# Patient Record
Sex: Female | Born: 1935 | Race: Black or African American | Hispanic: No | Smoking: Former smoker
Health system: Southern US, Community
[De-identification: ages and names within clinical notes are randomized; demographics above are authoritative.]

## PROBLEM LIST (undated history)

## (undated) DIAGNOSIS — I714 Abdominal aortic aneurysm, without rupture, unspecified: Secondary | ICD-10-CM

## (undated) DIAGNOSIS — Z950 Presence of cardiac pacemaker: Secondary | ICD-10-CM

## (undated) DIAGNOSIS — I509 Heart failure, unspecified: Secondary | ICD-10-CM

## (undated) DIAGNOSIS — I10 Essential (primary) hypertension: Secondary | ICD-10-CM

## (undated) DIAGNOSIS — K5790 Diverticulosis of intestine, part unspecified, without perforation or abscess without bleeding: Secondary | ICD-10-CM

## (undated) DIAGNOSIS — K219 Gastro-esophageal reflux disease without esophagitis: Secondary | ICD-10-CM

## (undated) DIAGNOSIS — K5090 Crohn's disease, unspecified, without complications: Secondary | ICD-10-CM

## (undated) DIAGNOSIS — E041 Nontoxic single thyroid nodule: Secondary | ICD-10-CM

## (undated) DIAGNOSIS — K922 Gastrointestinal hemorrhage, unspecified: Secondary | ICD-10-CM

## (undated) DIAGNOSIS — G4733 Obstructive sleep apnea (adult) (pediatric): Secondary | ICD-10-CM

## (undated) DIAGNOSIS — K5792 Diverticulitis of intestine, part unspecified, without perforation or abscess without bleeding: Secondary | ICD-10-CM

## (undated) HISTORY — PX: CHOLECYSTECTOMY: SHX55

## (undated) HISTORY — PX: CORONARY ANGIOPLASTY WITH STENT PLACEMENT: SHX49

---

## 2013-01-08 HISTORY — PX: PACEMAKER PLACEMENT: SHX43

## 2015-05-30 ENCOUNTER — Emergency Department (HOSPITAL_COMMUNITY): Payer: Medicare Other

## 2015-05-30 ENCOUNTER — Inpatient Hospital Stay (HOSPITAL_COMMUNITY)
Admission: EM | Admit: 2015-05-30 | Discharge: 2015-06-04 | DRG: 378 | Disposition: A | Discharge status: Home / Self Care | Payer: Medicare Other | Attending: Internal Medicine | Admitting: Internal Medicine

## 2015-05-30 ENCOUNTER — Encounter (HOSPITAL_COMMUNITY): Payer: Self-pay

## 2015-05-30 DIAGNOSIS — R1011 Right upper quadrant pain: Secondary | ICD-10-CM | POA: Diagnosis not present

## 2015-05-30 DIAGNOSIS — K922 Gastrointestinal hemorrhage, unspecified: Principal | ICD-10-CM | POA: Diagnosis present

## 2015-05-30 DIAGNOSIS — Z7982 Long term (current) use of aspirin: Secondary | ICD-10-CM

## 2015-05-30 DIAGNOSIS — E860 Dehydration: Secondary | ICD-10-CM | POA: Diagnosis present

## 2015-05-30 DIAGNOSIS — Z950 Presence of cardiac pacemaker: Secondary | ICD-10-CM

## 2015-05-30 DIAGNOSIS — K50811 Crohn's disease of both small and large intestine with rectal bleeding: Secondary | ICD-10-CM | POA: Diagnosis not present

## 2015-05-30 DIAGNOSIS — R195 Other fecal abnormalities: Secondary | ICD-10-CM | POA: Insufficient documentation

## 2015-05-30 DIAGNOSIS — N289 Disorder of kidney and ureter, unspecified: Secondary | ICD-10-CM

## 2015-05-30 DIAGNOSIS — Z87891 Personal history of nicotine dependence: Secondary | ICD-10-CM

## 2015-05-30 DIAGNOSIS — Z6832 Body mass index (BMI) 32.0-32.9, adult: Secondary | ICD-10-CM

## 2015-05-30 DIAGNOSIS — I10 Essential (primary) hypertension: Secondary | ICD-10-CM | POA: Diagnosis present

## 2015-05-30 DIAGNOSIS — I2510 Atherosclerotic heart disease of native coronary artery without angina pectoris: Secondary | ICD-10-CM | POA: Diagnosis not present

## 2015-05-30 DIAGNOSIS — Z7951 Long term (current) use of inhaled steroids: Secondary | ICD-10-CM

## 2015-05-30 DIAGNOSIS — K2900 Acute gastritis without bleeding: Secondary | ICD-10-CM | POA: Diagnosis present

## 2015-05-30 DIAGNOSIS — K5730 Diverticulosis of large intestine without perforation or abscess without bleeding: Secondary | ICD-10-CM | POA: Diagnosis present

## 2015-05-30 DIAGNOSIS — Z955 Presence of coronary angioplasty implant and graft: Secondary | ICD-10-CM

## 2015-05-30 DIAGNOSIS — D649 Anemia, unspecified: Secondary | ICD-10-CM | POA: Diagnosis present

## 2015-05-30 DIAGNOSIS — Z79899 Other long term (current) drug therapy: Secondary | ICD-10-CM

## 2015-05-30 DIAGNOSIS — K449 Diaphragmatic hernia without obstruction or gangrene: Secondary | ICD-10-CM | POA: Diagnosis present

## 2015-05-30 DIAGNOSIS — K921 Melena: Secondary | ICD-10-CM | POA: Diagnosis present

## 2015-05-30 DIAGNOSIS — D62 Acute posthemorrhagic anemia: Secondary | ICD-10-CM | POA: Diagnosis present

## 2015-05-30 DIAGNOSIS — D509 Iron deficiency anemia, unspecified: Secondary | ICD-10-CM | POA: Diagnosis present

## 2015-05-30 DIAGNOSIS — R109 Unspecified abdominal pain: Secondary | ICD-10-CM | POA: Diagnosis present

## 2015-05-30 DIAGNOSIS — K641 Second degree hemorrhoids: Secondary | ICD-10-CM | POA: Diagnosis present

## 2015-05-30 DIAGNOSIS — N179 Acute kidney failure, unspecified: Secondary | ICD-10-CM | POA: Diagnosis present

## 2015-05-30 DIAGNOSIS — D500 Iron deficiency anemia secondary to blood loss (chronic): Secondary | ICD-10-CM | POA: Diagnosis not present

## 2015-05-30 DIAGNOSIS — Z800 Family history of malignant neoplasm of digestive organs: Secondary | ICD-10-CM

## 2015-05-30 DIAGNOSIS — K5090 Crohn's disease, unspecified, without complications: Secondary | ICD-10-CM | POA: Diagnosis present

## 2015-05-30 DIAGNOSIS — E669 Obesity, unspecified: Secondary | ICD-10-CM | POA: Diagnosis present

## 2015-05-30 DIAGNOSIS — E871 Hypo-osmolality and hyponatremia: Secondary | ICD-10-CM | POA: Diagnosis present

## 2015-05-30 DIAGNOSIS — K644 Residual hemorrhoidal skin tags: Secondary | ICD-10-CM | POA: Diagnosis present

## 2015-05-30 HISTORY — DX: Essential (primary) hypertension: I10

## 2015-05-30 HISTORY — DX: Crohn's disease, unspecified, without complications: K50.90

## 2015-05-30 LAB — COMPREHENSIVE METABOLIC PANEL
ALBUMIN: 2.6 g/dL — AB (ref 3.5–5.0)
ALT: 8 U/L — AB (ref 14–54)
AST: 18 U/L (ref 15–41)
Alkaline Phosphatase: 74 U/L (ref 38–126)
Anion gap: 6 (ref 5–15)
BUN: 13 mg/dL (ref 6–20)
CHLORIDE: 101 mmol/L (ref 101–111)
CO2: 27 mmol/L (ref 22–32)
CREATININE: 1.14 mg/dL — AB (ref 0.44–1.00)
Calcium: 8 mg/dL — ABNORMAL LOW (ref 8.9–10.3)
GFR calc Af Amer: 50 mL/min — ABNORMAL LOW (ref >60)
GFR calc non Af Amer: 43 mL/min — ABNORMAL LOW (ref >60)
GLUCOSE: 109 mg/dL — AB (ref 65–99)
POTASSIUM: 4 mmol/L (ref 3.5–5.1)
Sodium: 134 mmol/L — ABNORMAL LOW (ref 135–145)
Total Bilirubin: 0.5 mg/dL (ref 0.3–1.2)
Total Protein: 6.2 g/dL — ABNORMAL LOW (ref 6.5–8.1)

## 2015-05-30 LAB — CBC WITH DIFFERENTIAL/PLATELET
BASOS ABS: 0 10*3/uL (ref 0.0–0.1)
BASOS PCT: 0 %
EOS PCT: 1 %
Eosinophils Absolute: 0 10*3/uL (ref 0.0–0.7)
HEMATOCRIT: 24.7 % — AB (ref 36.0–46.0)
Hemoglobin: 7.6 g/dL — ABNORMAL LOW (ref 12.0–15.0)
LYMPHS PCT: 17 %
Lymphs Abs: 0.9 10*3/uL (ref 0.7–4.0)
MCH: 25.8 pg — ABNORMAL LOW (ref 26.0–34.0)
MCHC: 30.8 g/dL (ref 30.0–36.0)
MCV: 83.7 fL (ref 78.0–100.0)
MONO ABS: 0.8 10*3/uL (ref 0.1–1.0)
Monocytes Relative: 15 %
NEUTROS ABS: 3.8 10*3/uL (ref 1.7–7.7)
Neutrophils Relative %: 67 %
PLATELETS: 390 10*3/uL (ref 150–400)
RBC: 2.95 MIL/uL — AB (ref 3.87–5.11)
RDW: 16.5 % — AB (ref 11.5–15.5)
WBC: 5.6 10*3/uL (ref 4.0–10.5)

## 2015-05-30 LAB — HEMOGLOBIN: HEMOGLOBIN: 7.4 g/dL — AB (ref 12.0–15.0)

## 2015-05-30 LAB — PROTIME-INR
INR: 1.11 (ref 0.00–1.49)
Prothrombin Time: 14.5 seconds (ref 11.6–15.2)

## 2015-05-30 LAB — HEMATOCRIT: HCT: 24 % — ABNORMAL LOW (ref 36.0–46.0)

## 2015-05-30 LAB — PREPARE RBC (CROSSMATCH)

## 2015-05-30 LAB — ABO/RH: ABO/RH(D): A POS

## 2015-05-30 LAB — POC OCCULT BLOOD, ED: FECAL OCCULT BLD: POSITIVE — AB

## 2015-05-30 LAB — LIPASE, BLOOD: Lipase: 33 U/L (ref 11–51)

## 2015-05-30 IMAGING — DX DG ABDOMEN ACUTE W/ 1V CHEST
4 series · 4 of 4 positions shown · non-contrast
Comparison: None.

CLINICAL DATA: Right upper quadrant abdominal pain for several
months.

EXAM:
DG ABDOMEN ACUTE W/ 1V CHEST

[chest pa]
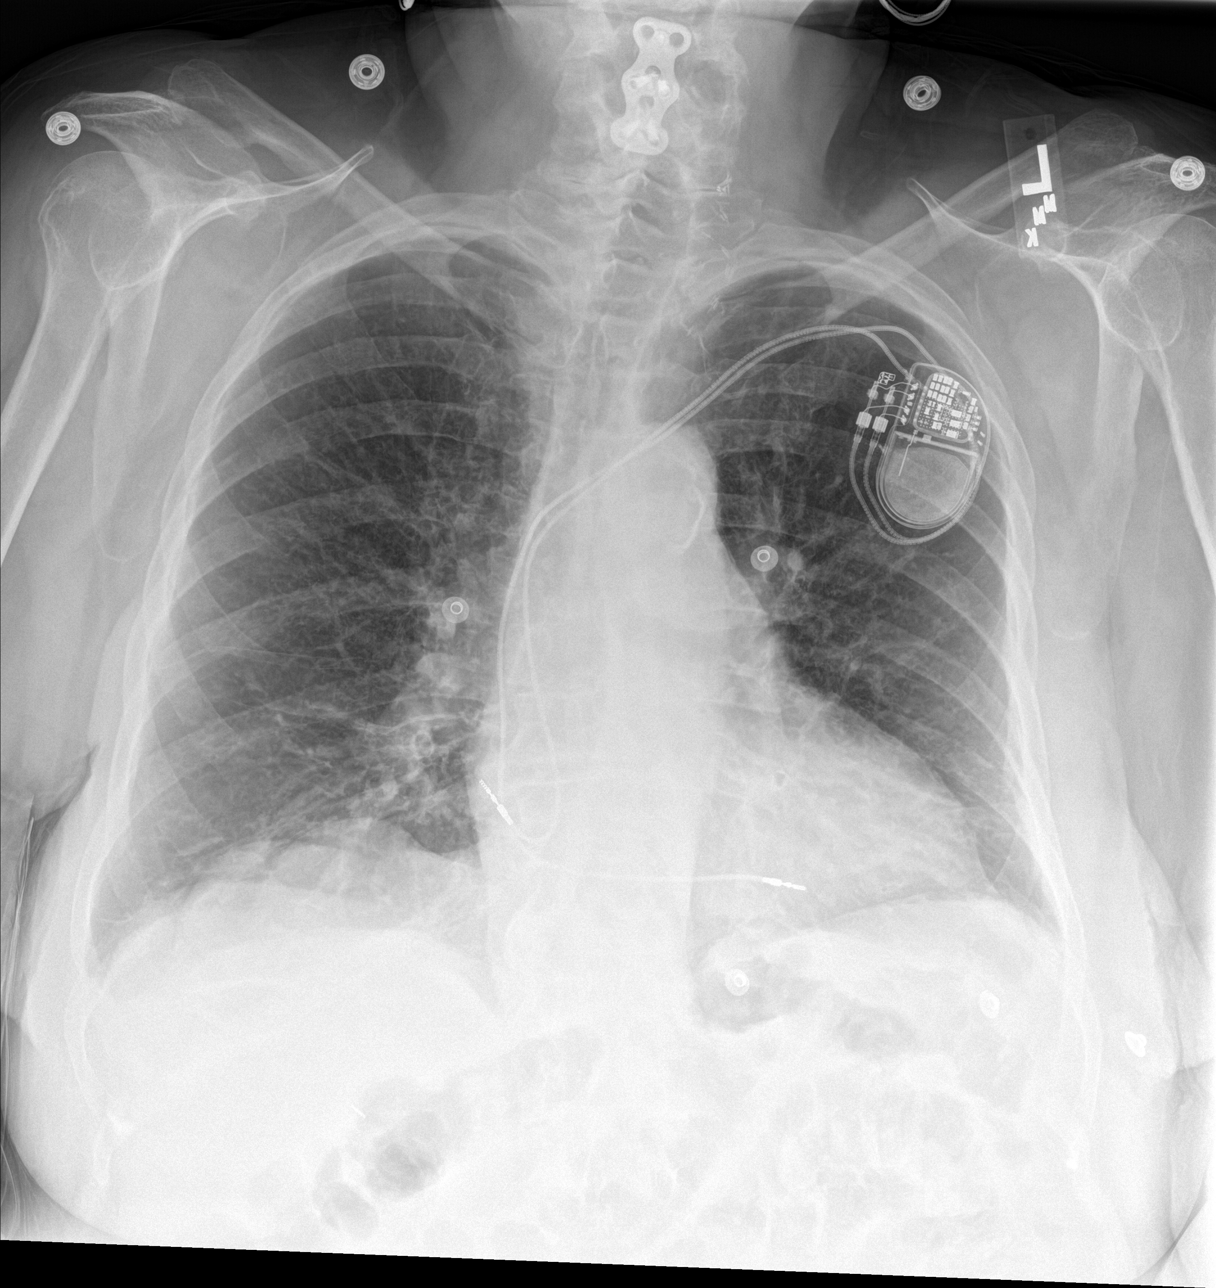

[abdomen erect]
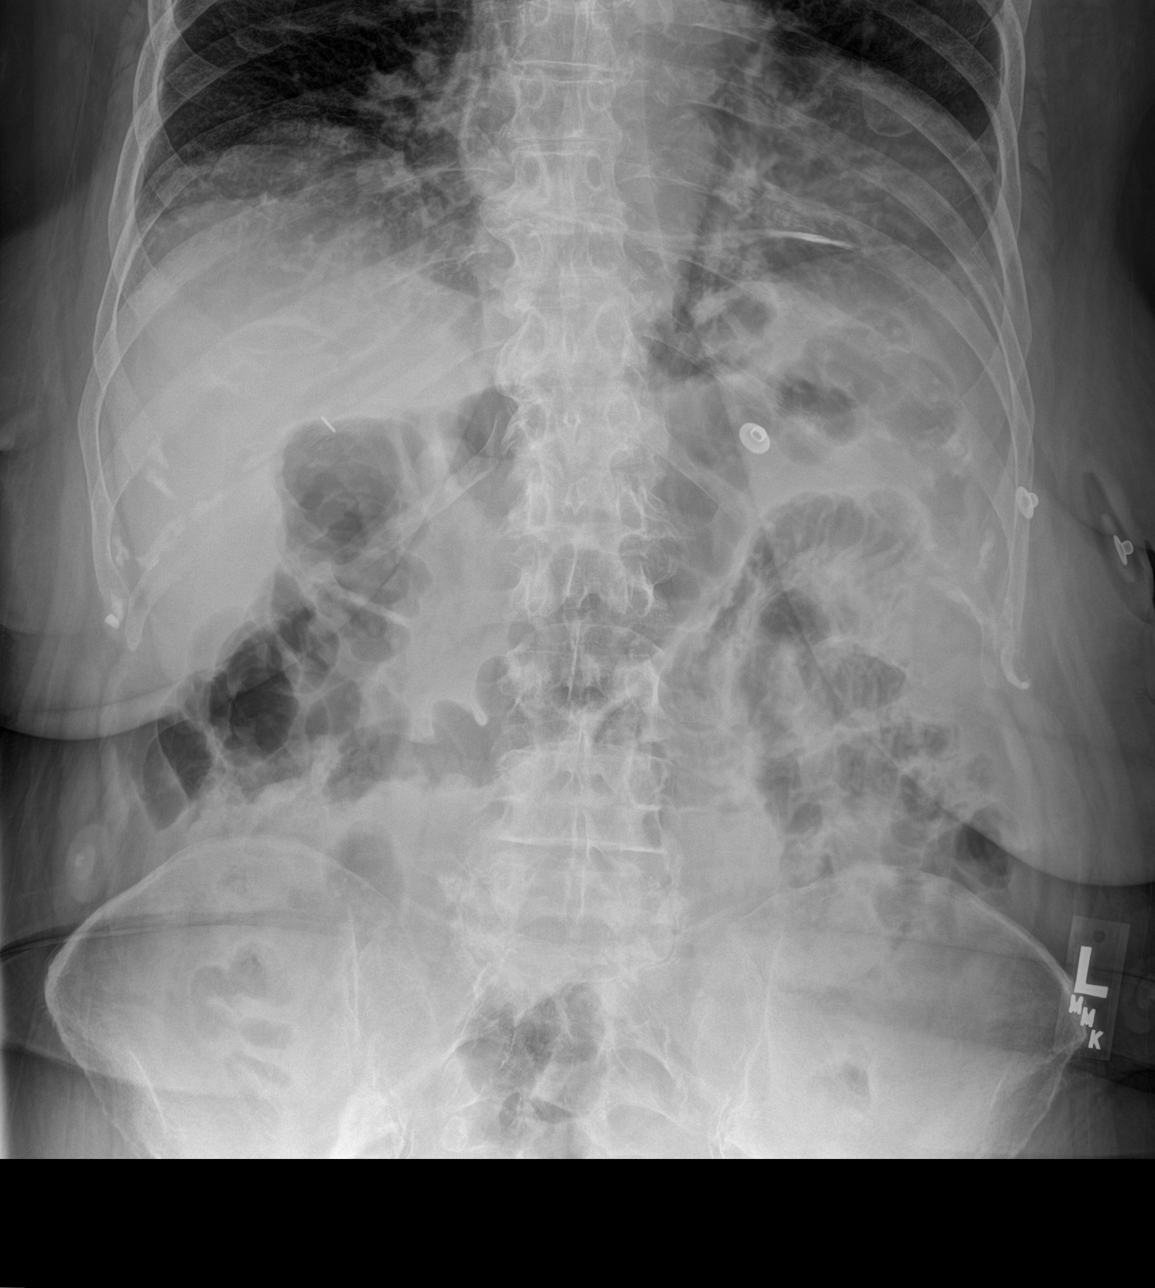

[abdomen supine (1 of 2)]
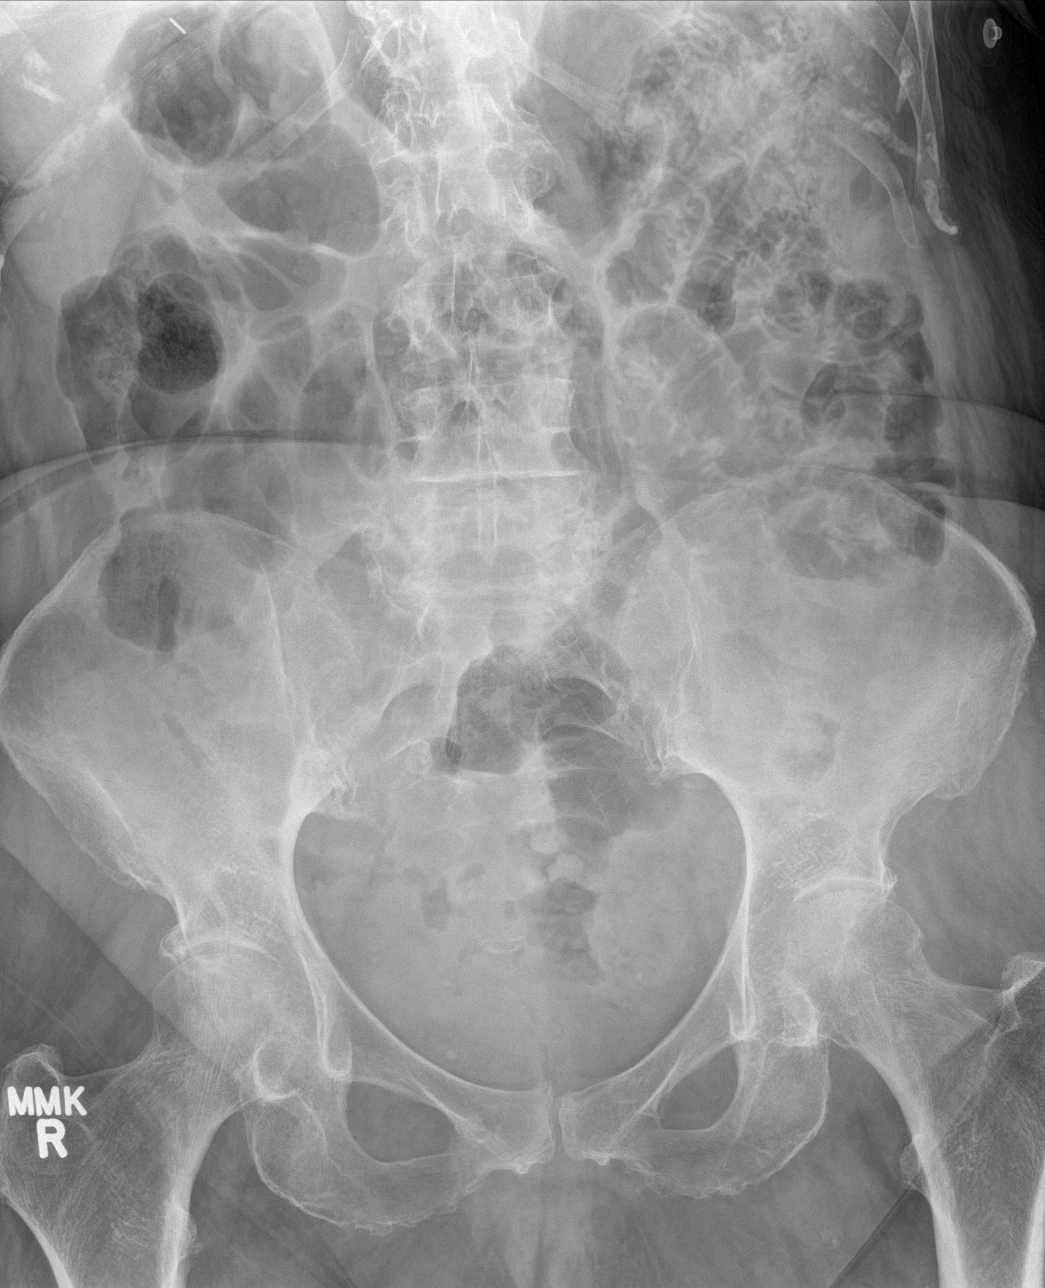

[abdomen supine (2 of 2)]
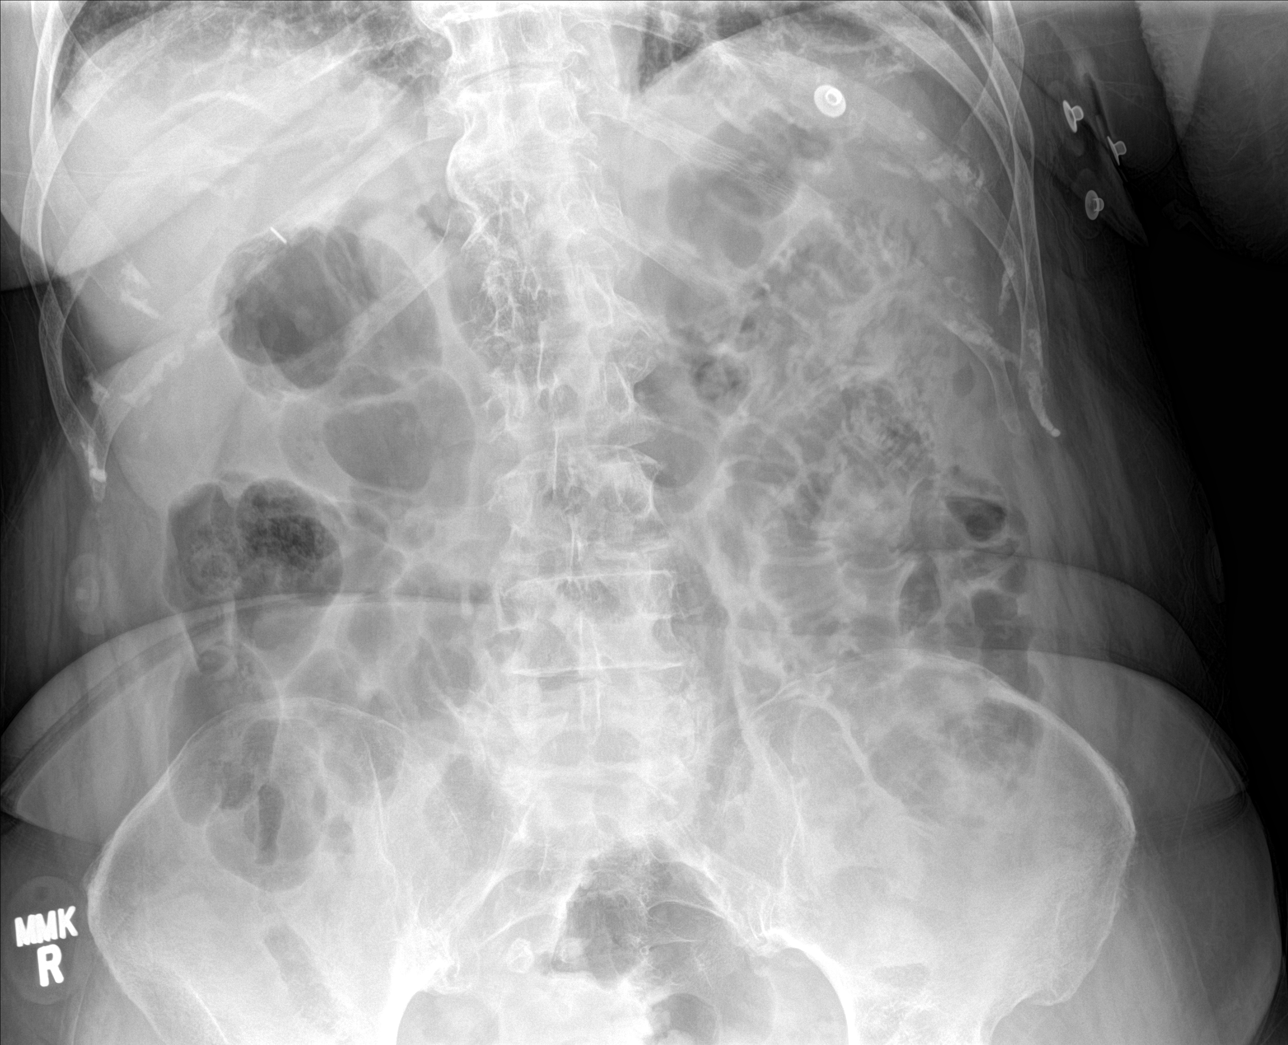

[4 of 4 positions shown; findings below may reference images not displayed]

FINDINGS: There is no evidence of dilated bowel loops or free intraperitoneal
air. Phleboliths are noted in the pelvis. Heart size and mediastinal
contours are within normal limits. Both lungs are clear.
IMPRESSION: No evidence of bowel obstruction or ileus. No acute cardiopulmonary
disease.

## 2015-05-30 MED ORDER — SODIUM CHLORIDE 0.9 % IV BOLUS (SEPSIS)
500.0000 mL | Freq: Once | INTRAVENOUS | Status: AC
  Administered 2015-05-30: 500 mL via INTRAVENOUS

## 2015-05-30 MED ORDER — PANTOPRAZOLE SODIUM 40 MG IV SOLR
40.0000 mg | Freq: Two times a day (BID) | INTRAVENOUS | Status: DC
  Administered 2015-05-30 – 2015-05-31 (×3): 40 mg via INTRAVENOUS
  Filled 2015-05-30 (×2): qty 40

## 2015-05-30 MED ORDER — ACETAMINOPHEN 650 MG RE SUPP: 650.0000 mg | Freq: Four times a day (QID) | RECTAL | Status: DC | PRN

## 2015-05-30 MED ORDER — ACETAMINOPHEN 325 MG PO TABS
650.0000 mg | ORAL_TABLET | Freq: Four times a day (QID) | ORAL | Status: DC | PRN
  Administered 2015-06-01: 650 mg via ORAL
  Filled 2015-05-30: qty 2

## 2015-05-30 MED ORDER — PANTOPRAZOLE SODIUM 40 MG IV SOLR
40.0000 mg | Freq: Once | INTRAVENOUS | Status: AC
  Administered 2015-05-30: 40 mg via INTRAVENOUS
  Filled 2015-05-30: qty 40

## 2015-05-30 MED ORDER — HYDRALAZINE HCL 20 MG/ML IJ SOLN: 10.0000 mg | INTRAMUSCULAR | Status: DC | PRN

## 2015-05-30 MED ORDER — ONDANSETRON HCL 4 MG/2ML IJ SOLN: 4.0000 mg | Freq: Four times a day (QID) | INTRAMUSCULAR | Status: DC | PRN

## 2015-05-30 MED ORDER — POLYETHYLENE GLYCOL 3350 17 G PO PACK: 17.0000 g | PACK | Freq: Every day | ORAL | Status: DC | PRN

## 2015-05-30 MED ORDER — SODIUM CHLORIDE 0.9 % IV SOLN: Freq: Once | INTRAVENOUS | Status: AC

## 2015-05-30 MED ORDER — SODIUM CHLORIDE 0.9% FLUSH
3.0000 mL | Freq: Two times a day (BID) | INTRAVENOUS | Status: DC
  Administered 2015-05-30 – 2015-06-03 (×6): 3 mL via INTRAVENOUS

## 2015-05-30 MED ORDER — SODIUM CHLORIDE 0.9 % IV SOLN: Freq: Once | INTRAVENOUS | Status: DC

## 2015-05-30 MED ORDER — ONDANSETRON HCL 4 MG PO TABS: 4.0000 mg | ORAL_TABLET | Freq: Four times a day (QID) | ORAL | Status: DC | PRN

## 2015-05-30 MED ORDER — HYDROCODONE-ACETAMINOPHEN 5-325 MG PO TABS: 1.0000 | ORAL_TABLET | ORAL | Status: DC | PRN

## 2015-05-30 MED ORDER — BISACODYL 10 MG RE SUPP: 10.0000 mg | Freq: Every day | RECTAL | Status: DC | PRN

## 2015-05-30 NOTE — ED Provider Notes (Signed)
CSN: [id number]     Arrival date & time 05/30/15  1354 History   First MD Initiated Contact with Patient 05/30/15 1500     Chief Complaint  Patient presents with  . Abdominal Pain     (Consider location/radiation/quality/duration/timing/severity/associated sxs/prior Treatment) HPI Comments: Felicia Benson is a 82 y.o. female from out of town with history of Crohn's disease on pantaza and nexium, hypertension, third-degree heart block with pacemaker, and coronary artery disease presents to ED with worsening epigastric and right upper quadrant abdominal pain. Patient states pain has worsened over the last 10 days. Pain is intermittent, 5/10, and sharp in nature. No radiation of pain. Endorses early satiety. Improved with food consumption. Denies nausea, vomiting, diarrhea, or constipation. Denies hematochezia or melena. Patient reports back in February 2017 following Angiocath she was anemic with hemoglobin at 8.3. Internist placed her on iron supplementation. Patient has had intermittent epigastric discomfort since starting iron supplementation.  Most recent repeat hemoglobin was 9.8 approximately 2 weeks ago. Patient states she has not taken her iron supplementation in 1 week. She is not currently on blood thinners.   Of note, she receives her care in [place]. She reports a history of a precancerous stomach lesion that was removed three years ago. Patient also has a history of two prior obstructions secondary to Crohn's. She is scheduled for EGD on May 30th at [facility] for re-evaluation.    Lastly, EMS noted pacemaker was not pacing. Patient denies chest pain or shortness of breath. Pacemaker is medtronic.   Patient is a 82 y.o. female presenting with abdominal pain. The history is provided by the patient and a relative.  Abdominal Pain Pain location:  Epigastric and RUQ Pain radiates to:  Does not radiate Duration:  10 days Associated symptoms: cough and fatigue   Associated symptoms:  no chest pain, no chills, no constipation, no diarrhea, no dysuria, no fever, no hematuria, no nausea, no shortness of breath, no sore throat and no vomiting     Past Medical History  Diagnosis Date  . Hypertension   . Crohn's disease [facility])    Past Surgical History  Procedure Laterality Date  . Cholecystectomy     Family History  Problem Relation Age of Onset  . Colon cancer Neg Hx    Social History  Substance Use Topics  . Smoking status: Former Games developer  . Smokeless tobacco: None  . Alcohol Use: No   OB History    No data available     Review of Systems  Constitutional: Positive for appetite change ( decrease) and fatigue. Negative for fever, chills and diaphoresis.  HENT: Negative for sore throat and trouble swallowing.   Eyes: Negative for visual disturbance.  Respiratory: Positive for cough. Negative for shortness of breath.   Cardiovascular: Positive for leg swelling ( chronic b/l). Negative for chest pain.  Gastrointestinal: Positive for abdominal pain. Negative for nausea, vomiting, diarrhea, constipation and blood in stool.  Genitourinary: Negative for dysuria and hematuria.  Musculoskeletal: Negative for neck pain and neck stiffness.  Skin: Negative for rash.  Neurological: Negative for dizziness, syncope, light-headedness and numbness.      Allergies  Review of patient's allergies indicates no known allergies.  Home Medications   Prior to Admission medications   Not on File   BP 124/59 mmHg  Pulse 75  Temp(Src) 98 F (36.7 C) (Oral)  Resp 16  Ht 5\' 6"  (1.676 m)  Wt 90.719 kg  BMI 32.30 kg/m2  SpO2 98% Physical  Exam  Constitutional: She appears well-developed and well-nourished. No distress.  HENT:  Head: Normocephalic and atraumatic.  Mouth/Throat: Oropharynx is clear and moist. No oropharyngeal exudate.  Eyes: Conjunctivae and EOM are normal. Pupils are equal, round, and reactive to light. Right eye exhibits no discharge. Left eye exhibits no  discharge. No scleral icterus.  Neck: Normal range of motion. Neck supple.  Cardiovascular: Normal rate, regular rhythm, normal heart sounds and intact distal pulses.   No murmur heard. Pulmonary/Chest: Effort normal and breath sounds normal. No respiratory distress.  Pacemaker palpated in left anterior chest wall.   Abdominal: Soft. Normal appearance and bowel sounds are normal. She exhibits no distension, no pulsatile midline mass and no mass. There is tenderness in the right upper quadrant and epigastric area. There is no rebound and no guarding.  Chaperone present for duration of exam. External hemorrhoids noted. Stool in rectal vault. Stool is brown in coloration.   Musculoskeletal: Normal range of motion.  Lymphadenopathy:    She has no cervical adenopathy.  Neurological: She is alert. Coordination normal.  Skin: Skin is warm and dry. She is not diaphoretic.  Psychiatric: She has a normal mood and affect. Her behavior is normal.    ED Course  Procedures (including critical care time) Labs Review Labs Reviewed  CBC WITH DIFFERENTIAL/PLATELET - Abnormal; Notable for the following:    RBC 2.95 (*)    Hemoglobin 7.6 (*)    HCT 24.7 (*)    MCH 25.8 (*)    RDW 16.5 (*)    All other components within normal limits  COMPREHENSIVE METABOLIC PANEL - Abnormal; Notable for the following:    Sodium 134 (*)    Glucose, Bld 109 (*)    Creatinine, Ser 1.14 (*)    Calcium 8.0 (*)    Total Protein 6.2 (*)    Albumin 2.6 (*)    ALT 8 (*)    GFR calc non Af Amer 43 (*)    GFR calc Af Amer 50 (*)    All other components within normal limits  HEMOGLOBIN - Abnormal; Notable for the following:    Hemoglobin 7.4 (*)    All other components within normal limits  HEMATOCRIT - Abnormal; Notable for the following:    HCT 24.0 (*)    All other components within normal limits  POC OCCULT BLOOD, ED - Abnormal; Notable for the following:    Fecal Occult Bld POSITIVE (*)    All other components  within normal limits  LIPASE, BLOOD  PROTIME-INR  BASIC METABOLIC PANEL  HEMOGLOBIN AND HEMATOCRIT, BLOOD  TYPE AND SCREEN  ABO/RH  PREPARE RBC (CROSSMATCH)    Imaging Review Dg Abd Acute W/chest  05/30/2015  CLINICAL DATA:  Right upper quadrant abdominal pain for several months. EXAM: DG ABDOMEN ACUTE W/ 1V CHEST COMPARISON:  None. FINDINGS: There is no evidence of dilated bowel loops or free intraperitoneal air. Phleboliths are noted in the pelvis. Heart size and mediastinal contours are within normal limits. Both lungs are clear. IMPRESSION: No evidence of bowel obstruction or ileus. No acute cardiopulmonary disease. Electronically Signed   By: Lupita Raider, M.D.   On: 05/30/2015 17:02   I have personally reviewed and evaluated these images and lab results as part of my medical decision-making.   EKG Interpretation None      MDM   Final diagnoses:  Heme + stool  Anemia, unspecified anemia type   KYLIEE ORTEGO is a 82 y.o. female with history  of Crohn's, hypertension, third-degree heart block status post pacemaker, coronary artery disease presents to ED with complaint of worsening epigastric and right upper quadrant pain 10 days. Patient is afebrile and nontoxic. Vital signs are stable. Physical exam remarkable for tenderness to palpation of epigastric and right upper quadrant. Abdomen is soft and positive bowel sounds heard. Will check CBC, CMP, fecal cold, coags, lipase.  Of note, EMS reports pacemaker not pacing. In-hospital, pacemaker is pacing. She denies chest pain or shortness of breath will interrogate pacemaker.   CBC remarkable for hemoglobin of 7.6. Per patient last hemoglobin evaluation was 9.8 on April 29. Hemoccult positive. Coags normal. LFTs unremarkable. Lipase normal. Type and screen conducted. CMP remarkable for elevated creatinine. Abdominal plain films negative for obstruction or ileus. Based on 2 g drop in hemoglobin and Hemoccult-positive will consult  [facility] for admission for further evaluation and possible transfusion.  5:40PM: Pacemaker interrogation unremarkable. Demand pacemaker.   6:15 PM: Consulted Dr. Antionette Char with [facility]. Agrees to admit patient to MedSurg.   [address] [place], [place] 05/31/15 9480  Eber Hong, MD 05/31/15 [phone]

## 2015-05-30 NOTE — ED Notes (Signed)
Attempted report x1. 

## 2015-05-30 NOTE — ED Notes (Signed)
This RN attempted IV access x 2 w/o success.  

## 2015-05-30 NOTE — ED Provider Notes (Signed)
The patient is an 82 year old female with a long history of multiple medical problems including Crohn's disease for which she takes medications as well as coronary disease, she has been stented multiple times including within the last 6 months. She reports that over the last couple of days she has had some increasing epigastric pain. On exam the patient has a soft abdomen with minimal tenderness in the epigastrium. She has no tachycardia, she has no peripheral edema of any concern, her laboratory values show that she has had a 2 g drop in her hemoglobin compared to what she reports from her visit with her GI doctor 3 weeks ago. Otherwise her labs are unremarkable, her Hemoccult test was positive, the stool was brown. She will be admitted to the hospital for symptomatically anemia, physician Asst. Daphane Shepherd discussed with hospitalist who agreed to admit.  Medical screening examination/treatment/procedure(s) were conducted as a shared visit with non-physician practitioner(s) and myself.  I personally evaluated the patient during the encounter.   Clinical Impression:   Final diagnoses:  Heme + stool  Anemia, unspecified anemia type         Eber Hong, MD 05/31/15 [phone]

## 2015-05-30 NOTE — ED Notes (Signed)
Per EMS - pt from [place], visiting family. Pt c/o right upper abd/epigastric discomfort x 1 week. Reports decreased appetite and fatigue, denies n/v/d. Pt supposed to have endoscopy this week. Gallbladder prev removed. Hx abd issues, including chrons. Pt has pacemaker, not pacing for EMS. Normal bowel movement this morning. Pt took  aspirin PTA. Hx former smoker, htn.

## 2015-05-30 NOTE — H&P (Signed)
History and Physical    Felicia Benson DJS:[id number] DOB: Feb 19, 1930 DOA: 05/30/2015  PCP: Pcp Not In System   Patient coming from: Home  Chief Complaint: RUQ abdominal pain, fatigue, generalized weakness  HPI: Felicia Benson is a 82 y.o. female with medical history significant for Crohn's disease, coronary artery disease with stent, hypertension, and third degree heart block with pacer who presents to the ED with right upper quadrant epigastric pain, fatigue, generalized weakness, and early satiety. Patient is here visiting family from [place], [place] where she typically receives her medical care. She reports along-standing history of intermittent epigastric discomfort and underwent a series of upper endoscopies with what sounds like resection of dysplastic mucosa before being told that the concerning tissue was gone. She never received any other treatment for this such as chemotherapy or radiation. She has continued to take Nexium twice a day but reports a recurrence in her epigastric and right upper quadrant discomfort for approximately 2 months, worsening particularly over the past week. Patient reports her hemoglobin was checkedat the end of April and 9.8 at that time.  She had been scheduled by her outpatient gastroenterologist in [place] for upper endoscopy on May 30. She denies nausea, vomiting, or diarrhea. She denies melena or hematochezia. She denies chest pain, palpitations, or dyspnea. She takes a baby aspirin daily, but no other antiplatelet or blood thinner. With her condition worsening, the patient called EMS for transport to the hospital. She was instructed by the dispatcher to take 4 baby aspirins, which she did.  ED Course: Upon arrival to the ED, patient is found to be afebrile, saturating well on room air, and with vital signs stable. DRE was performed with return of brown stool which was FOBT positive. Chest x-ray was negative for acute cardiopulmonary disease  and KUB negative for ileus or evidence of bowel obstruction. CMP features a serum creatinine 1.14 with no prior for comparison, a mild hyponatremia, and albumin of 2.6. CBC is notable for hemoglobin of 7.6 with normal MCV. INR is normal at 1.11.  Patient was given 40 mg IV push of Protonix, type and screen was performed, and gastroenterology was consulted by the EDP. EMS had raised concern for the patient's pacer possibly malfunctioning, but it was interrogated in the ED and normal. Patient remained hemodynamically stable in the emergency department and will be admitted to hospital for ongoing evaluation and management of anemia, suspected secondary to upper GI bleed.  Review of Systems:  All other systems reviewed and apart from HPI, are negative.  Past Medical History  Diagnosis Date  . Hypertension   . Crohn's disease [facility])     Past Surgical History  Procedure Laterality Date  . Cholecystectomy       reports that she has quit smoking. She does not have any smokeless tobacco history on file. She reports that she does not drink alcohol or use illicit drugs.  No Known Allergies  Family History  Problem Relation Age of Onset  . Colon cancer Neg Hx      Prior to Admission medications   Not on File    Physical Exam: Filed Vitals:   05/30/15 1445 05/30/15 1515 05/30/15 1545 05/30/15 1615  BP: 111/68 117/68 104/56 126/76  Pulse: 58 64 60 68  Temp:      TempSrc:      Resp: 13 15 15 15   Height:      Weight:      SpO2: 100% 98% 94% 97%  Constitutional: NAD, calm, comfortable Eyes: PERTLA, lids and conjunctivae normal ENMT: Mucous membranes are moist. Posterior pharynx clear of any exudate or lesions.   Neck: normal, supple, no masses, no thyromegaly Respiratory: clear to auscultation bilaterally, no wheezing, no crackles. Normal respiratory effort.    Cardiovascular: S1 & S2 heard, regular rate and rhythm, no significant murmurs / rubs / gallops. 2+ pedal pulses.     Abdomen: No distension, mild tenderness in upper quadrants, no rebound or guarding, no masses palpated. Bowel sounds normal.  Musculoskeletal: no clubbing / cyanosis. No joint deformity upper and lower extremities. Normal muscle tone.  Skin: no significant rashes, lesions, ulcers. Warm, dry, well-perfused. Neurologic: CN 2-12 grossly intact. Sensation intact, DTR normal. Strength 5/5 in all 4 limbs.  Psychiatric: Normal judgment and insight. Alert and oriented x 3. Normal mood and affect.     Labs on Admission: I have personally reviewed following labs and imaging studies  CBC:  Recent Labs Lab 05/30/15 1430  WBC 5.6  NEUTROABS 3.8  HGB 7.6*  HCT 24.7*  MCV 83.7  PLT 478   Basic Metabolic Panel:  Recent Labs Lab 05/30/15 1430  NA 134*  K 4.0  CL 101  CO2 27  GLUCOSE 109*  BUN 13  CREATININE 1.14*  CALCIUM 8.0*   GFR: Estimated Creatinine Clearance: 42.4 mL/min (by C-G formula based on Cr of 1.14). Liver Function Tests:  Recent Labs Lab 05/30/15 1430  AST 18  ALT 8*  ALKPHOS 74  BILITOT 0.5  PROT 6.2*  ALBUMIN 2.6*    Recent Labs Lab 05/30/15 1430  LIPASE 33   No results for input(s): AMMONIA in the last 168 hours. Coagulation Profile:  Recent Labs Lab 05/30/15 1552  INR 1.11   Cardiac Enzymes: No results for input(s): CKTOTAL, CKMB, CKMBINDEX, TROPONINI in the last 168 hours. BNP (last 3 results) No results for input(s): PROBNP in the last 8760 hours. HbA1C: No results for input(s): HGBA1C in the last 72 hours. CBG: No results for input(s): GLUCAP in the last 168 hours. Lipid Profile: No results for input(s): CHOL, HDL, LDLCALC, TRIG, CHOLHDL, LDLDIRECT in the last 72 hours. Thyroid Function Tests: No results for input(s): TSH, T4TOTAL, FREET4, T3FREE, THYROIDAB in the last 72 hours. Anemia Panel: No results for input(s): VITAMINB12, FOLATE, FERRITIN, TIBC, IRON, RETICCTPCT in the last 72 hours. Urine analysis: No results found for:  COLORURINE, APPEARANCEUR, LABSPEC, PHURINE, GLUCOSEU, HGBUR, BILIRUBINUR, KETONESUR, PROTEINUR, UROBILINOGEN, NITRITE, LEUKOCYTESUR Sepsis Labs: @LABRCNTIP (procalcitonin:4,lacticidven:4) )No results found for this or any previous visit (from the past 240 hour(s)).   Radiological Exams on Admission: Dg Abd Acute W/chest  05/30/2015  CLINICAL DATA:  Right upper quadrant abdominal pain for several months. EXAM: DG ABDOMEN ACUTE W/ 1V CHEST COMPARISON:  None. FINDINGS: There is no evidence of dilated bowel loops or free intraperitoneal air. Phleboliths are noted in the pelvis. Heart size and mediastinal contours are within normal limits. Both lungs are clear. IMPRESSION: No evidence of bowel obstruction or ileus. No acute cardiopulmonary disease. Electronically Signed   By: Marijo Conception, M.D.   On: 05/30/2015 17:02    EKG: Not performed, will obtain as appropriate.   Assessment/Plan  1. GI blood-loss anemia  - Upper GI source suspected given the epigastric discomfort and (unclear) hx of gastric lesion  - Hgb is 7.6 on admission, down from 9.8 three weeks ago per pt report  - Protonix 40 mg IV given in ED; give another 40 mg IV now, then q12h  -  Gastroenterology consulted by the EDP and much appreciated   - Hold daily ASA 81, hold pharmacologic VTE ppx  - 2 units pRBCs on-hold; transfuse 1 unit now given she is symptomatic and there is concern for active blood-loss  - RN asked to place order for post-transfusion H&H  - Given hx of CAD, keep O2 sats high 90s while severely anemic  2. Crohn's disease - Stable per pt report  - Managed with mesalamine and Nexium at home  - Hold home agents for now; Protonix scheduled as above   3. Kidney disease of unknown chronicity  - SCr 1.14 on admission, corresponding to eGFR ~50; no prior on file  - AKI certainly possible given recent poor oral intake and severe anemia  - Gave a 500 cc NS bolus, transfusing 1 unit pRBC now as above  - Avoid  nephrotoxins - Repeat chem panel in am   4. Hypertension - At goal currently  - Hold home agents for now  - Hydralazine IVP prn    5. Hyponatremia  - Serum sodium 134 on admission in setting of dehydration  - NS bolus given on admission  - Repeat chem panel in am   6. CAD - No anginal complaints - Had BMS placed in February 2017  - Completed 1 month of Plavix, told to discontinue by her cardiologist  - Takes ASA 81 mg qD, held as above in light of GIB  - Monitor on telemetry for ischemic changes given severe anemia    DVT prophylaxis: SCD  Code Status: Full Family Communication: Daughter at bedside  Disposition Plan: Observe on telemetry   Consults called: GI consulted by EDP Admission status: Observation    Vianne Bulls, MD [facility] Pager [phone]  If 7PM-7AM, please contact night-coverage [email or web address] Password [facility]  05/30/2015, 7:34 PM

## 2015-05-31 ENCOUNTER — Encounter (HOSPITAL_COMMUNITY): Payer: Self-pay

## 2015-05-31 DIAGNOSIS — D62 Acute posthemorrhagic anemia: Secondary | ICD-10-CM | POA: Diagnosis not present

## 2015-05-31 DIAGNOSIS — R195 Other fecal abnormalities: Secondary | ICD-10-CM | POA: Diagnosis not present

## 2015-05-31 DIAGNOSIS — D500 Iron deficiency anemia secondary to blood loss (chronic): Secondary | ICD-10-CM | POA: Diagnosis not present

## 2015-05-31 DIAGNOSIS — R109 Unspecified abdominal pain: Secondary | ICD-10-CM | POA: Diagnosis not present

## 2015-05-31 LAB — BASIC METABOLIC PANEL
Anion gap: 6 (ref 5–15)
BUN: 8 mg/dL (ref 6–20)
CALCIUM: 8.1 mg/dL — AB (ref 8.9–10.3)
CO2: 28 mmol/L (ref 22–32)
CREATININE: 1.12 mg/dL — AB (ref 0.44–1.00)
Chloride: 101 mmol/L (ref 101–111)
GFR, EST AFRICAN AMERICAN: 51 mL/min — AB (ref >60)
GFR, EST NON AFRICAN AMERICAN: 44 mL/min — AB (ref >60)
Glucose, Bld: 101 mg/dL — ABNORMAL HIGH (ref 65–99)
Potassium: 3.4 mmol/L — ABNORMAL LOW (ref 3.5–5.1)
SODIUM: 135 mmol/L (ref 135–145)

## 2015-05-31 LAB — HEMOGLOBIN AND HEMATOCRIT, BLOOD
HEMATOCRIT: 26.2 % — AB (ref 36.0–46.0)
HEMOGLOBIN: 8.3 g/dL — AB (ref 12.0–15.0)

## 2015-05-31 LAB — GLUCOSE, CAPILLARY: Glucose-Capillary: 84 mg/dL (ref 65–99)

## 2015-05-31 MED ORDER — PEG 3350-KCL-NA BICARB-NACL 420 G PO SOLR
4000.0000 mL | Freq: Once | ORAL | Status: AC
  Administered 2015-05-31: 4000 mL via ORAL
  Filled 2015-05-31: qty 4000

## 2015-05-31 MED ORDER — SODIUM CHLORIDE 0.9 % IV SOLN
INTRAVENOUS | Status: DC
  Administered 2015-05-31: 1 mL via INTRAVENOUS

## 2015-05-31 NOTE — Progress Notes (Signed)
   05/31/15 1005  Clinical Encounter Type  Visited With Patient and family together  Visit Type Initial  Referral From Chaplain   Chaplain responded to a request to assist with advanced directive paperwork. Patient is not interested. Chaplain introduced spiritual care services. Spiritual care services available as needed.   Alda Ponder, Chaplain 05/31/2015 10:06 AM

## 2015-05-31 NOTE — Care Management Note (Signed)
Case Management Note  Patient Details  Name: Felicia Benson MRN: [id number] Date of Birth: [birth year]  Subjective/Objective:                 Spoke with patient in the room. She states that she is in town until June 8-9 staying with her daughter. She is from [place] and goes to [facility]. Patient denied needing any HH or CM assistance.    Action/Plan:  Will continue to follow for DC needs.  Expected Discharge Date:                  Expected Discharge Plan:  Home/Self Care  In-House Referral:  Clinical Social Work  Discharge planning Services  CM Consult  Post Acute Care Choice:  NA Choice offered to:     DME Arranged:    DME Agency:     HH Arranged:    HH Agency:     Status of Service:  Completed, signed off  Medicare Important Message Given:    Date Medicare IM Given:    Medicare IM give by:    Date Additional Medicare IM Given:    Additional Medicare Important Message give by:     If discussed at Long Length of Stay Meetings, dates discussed:    Additional Comments:  Lawerance Sabal, RN 05/31/2015, 1:16 PM

## 2015-05-31 NOTE — Progress Notes (Addendum)
Felicia Benson is a 82 y.o. female patient admitted from ED awake, alert - oriented  X 4 - no acute distress noted.  VSS - Blood pressure 124/59, pulse 75, temperature 98 F (36.7 C), temperature source Oral, resp. rate 16, height 5\' 6"  (1.676 m), weight 90.719 kg (200 lb), SpO2 98 %.    IV in place, occlusive dsg intact without redness.  Orientation to room, and floor completed with information packet given to patient.  Patient declined safety video at this time.  Admission INP armband ID verified with patient, and in place.   SR up x 2, fall assessment complete, with patient able to verbalize understanding of risk associated with falls, and verbalized understanding to call nsg before up out of bed.  Call light within reach, patient able to voice, and demonstrate understanding.  Skin, clean-dry- intact without evidence of bruising, or skin tears.  No evidence of skin break down noted on exam.     Will continue to evaluate and treat per MD orders.  Otis Dials, RN 05/30/15 8:35 PM

## 2015-05-31 NOTE — Care Management Obs Status (Signed)
MEDICARE OBSERVATION STATUS NOTIFICATION   Patient Details  Name: MESA JANUS MRN: [id number] Date of Birth: 07-25-1930   Medicare Observation Status Notification Given:  Yes  [id number]  Lawerance Sabal, RN 05/31/2015, 12:59 PM

## 2015-05-31 NOTE — Consult Note (Signed)
Referring Provider: Dr. Catha Gosselin Primary Care Physician:  Pcp Not In System Primary Gastroenterologist:  Gentry Fitz  Reason for Consultation:  Anemia  HPI: Felicia Benson is a 82 y.o. 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 Reports having 2 SBO's years ago and 2 years ago had the sudden onset of rectal bleeding while on Plavix for heart stents. Denies any overt bleeding recently and has been on iron pills with dark to black stools. Has been having recurrent sharp upper abdominal pain that will radiate to her RUQ and the pain will last all day when it occurs. Denies dysphagia, nausea, or vomiting. Reports eating small meals and doing well with that. Reports that she had a Hgb 9.8 on April 29th. Last colonoscopy 4-5 years ago in [place] that reportedly showed benign colon polyps. Two sisters and two brothers all had colon cancer occur in their 62's. Has a pacemaker.  Past Medical History  Diagnosis Date  . Hypertension   . Crohn's disease [facility])   Coronary Artery Disease  Past Surgical History  Procedure Laterality Date  . Cholecystectomy    Pacemaker  Prior to Admission medications   Medication Sig Start Date End Date Taking? Authorizing Provider  amLODipine (NORVASC) 10 MG tablet Take 10 mg by mouth every evening.  05/05/15  Yes Historical Provider, MD  atorvastatin (LIPITOR) 10 MG tablet Take 10 mg by mouth every evening.  04/06/15  Yes Historical Provider, MD  Cholecalciferol (VITAMIN D3) 5000 units TABS Take 5,000 Units by mouth daily.    Yes Historical Provider, MD  esomeprazole (NEXIUM) 40 MG capsule Take 40 mg by mouth 2 (two) times daily. 05/09/15  Yes Historical Provider, MD  losartan-hydrochlorothiazide (HYZAAR) 50-12.5 MG tablet Take 1 tablet by mouth daily. 05/10/15  Yes Historical Provider, MD  mesalamine (PENTASA) 500 MG CR capsule Take 1,500 mg by  mouth 2 (two) times daily.   Yes Historical Provider, MD  metoprolol succinate (TOPROL-XL) 25 MG 24 hr tablet Take 50 mg by mouth 2 (two) times daily. 04/23/15  Yes Historical Provider, MD  mometasone-formoterol (DULERA) 100-5 MCG/ACT AERO Inhale 2 puffs into the lungs 2 (two) times daily.   Yes Historical Provider, MD  montelukast (SINGULAIR) 10 MG tablet Take 10 mg by mouth daily. 04/05/15  Yes Historical Provider, MD    Scheduled Meds: . sodium chloride   Intravenous Once  . pantoprazole (PROTONIX) IV  40 mg Intravenous Q12H  . polyethylene glycol-electrolytes  4,000 mL Oral Once  . sodium chloride flush  3 mL Intravenous Q12H   Continuous Infusions: . sodium chloride     PRN Meds:.acetaminophen **OR** acetaminophen, bisacodyl, hydrALAZINE, HYDROcodone-acetaminophen, ondansetron **OR** ondansetron (ZOFRAN) IV, polyethylene glycol  Allergies as of 05/30/2015  . (No Known Allergies)    Family History  Problem Relation Age of Onset  . Colon cancer Neg Hx     Social History   Social History  . Marital Status: Widowed    Spouse Name: N/A  . Number of Children: N/A  . Years of Education: N/A   Occupational History  . Not on file.   Social History Main Topics  . Smoking status: Former Games developer  . Smokeless tobacco: Not on file  . Alcohol Use: No  . Drug Use: No  . Sexual Activity: Not on file   Other Topics Concern  . Not on file   Social History  Narrative    Review of Systems: All negative except as stated above in HPI.  Physical Exam: Vital signs: Filed Vitals:   05/31/15 0013 05/31/15 0425  BP: 124/59 123/62  Pulse: 75 69  Temp: 98 F (36.7 C) 98 F (36.7 C)  Resp: 16 18   Last BM Date: 05/30/15 General:   Alert,  Well-developed, well-nourished, pleasant and cooperative in NAD Head: atraumatic Eyes: anicteric sclera Lungs:  Clear throughout to auscultation.   No wheezes, crackles, or rhonchi. No acute distress. Heart:  Regular rate and rhythm; no  murmurs, clicks, rubs,  or gallops. Abdomen: soft, nontender, nondistended, +BS  Rectal:  Deferred Ext: no edema  GI:  Lab Results:  Recent Labs  05/30/15 1430 05/30/15 2054 05/31/15 0208  WBC 5.6  --   --   HGB 7.6* 7.4* 8.3*  HCT 24.7* 24.0* 26.2*  PLT 390  --   --    BMET  Recent Labs  05/30/15 1430 05/31/15 0208  NA 134* 135  K 4.0 3.4*  CL 101 101  CO2 27 28  GLUCOSE 109* 101*  BUN 13 8  CREATININE 1.14* 1.12*  CALCIUM 8.0* 8.1*   LFT  Recent Labs  05/30/15 1430  PROT 6.2*  ALBUMIN 2.6*  AST 18  ALT 8*  ALKPHOS 74  BILITOT 0.5   PT/INR  Recent Labs  05/30/15 1552  LABPROT 14.5  INR 1.11     Studies/Results: Dg Abd Acute W/chest  05/30/2015  CLINICAL DATA:  Right upper quadrant abdominal pain for several months. EXAM: DG ABDOMEN ACUTE W/ 1V CHEST COMPARISON:  None. FINDINGS: There is no evidence of dilated bowel loops or free intraperitoneal air. Phleboliths are noted in the pelvis. Heart size and mediastinal contours are within normal limits. Both lungs are clear. IMPRESSION: No evidence of bowel obstruction or ileus. No acute cardiopulmonary disease. Electronically Signed   By: James  Green Jr, M.D.   On: 05/30/2015 17:02    Impression/Plan: 82 yo with a remote history of Crohn's disease (extent not known) being seen for a consult due to anemia and abdominal pain. Doubt a Crohn's exacerbation. Needs an EGD/colonoscopy to further evaluate her anemia and abdominal pain. Colon prep this afternoon. Clear liquid diet. NPO p MN. Patient agreeable to proceed with the EGD/colonoscopy.    LOS: 1 day   Tahjay Binion C.  05/31/2015, 12:18 PM  Pager [phone]  If no answer or after 5 PM call [phone] 

## 2015-05-31 NOTE — Progress Notes (Signed)
PROGRESS NOTE    Felicia Benson  ZOX:[id number] DOB: 08/01/1930 DOA: 05/30/2015 PCP: Pcp Not In System   Brief Narrative:  82 year old with history of Crohn's disease, coronary artery disease, hypertension, presented to the emergency department with complaints of right upper quadrant epigastric pain fatigue and generalized weakness. Patient was recently hospitalized and told her hemoglobin was 9.8 at the end of April. She has been taking iron supplements. [facility] and follows with gastroenterology there, she was set up to have an endoscopy done on May 30. On arrival, patient was found to have a hemoglobin of 7.6, she is given 1 unit PRBCs. Hemoglobin currently 8.3. Gastroenterology consulted and appreciated.  Assessment & Plan   GI bleed/acute blood loss anemia/symptomatic anemia -Upon admission, hemoglobin was 7.6, down from 3 weeks prior 9.8 -Continue protonix -Aspirin held, patient denies any use of NSAIDs -Gastroenterology consulted and appreciated -Patient did receive blood transfusion, hemoglobin currently 8.3 -Plans for colonoscopy and EGD on 06/01/2015  Crohn's disease -Currently appears to be stable, denies any diarrhea at this time -All medications of mesalamine and Nexium currently held  Acute kidney injury versus chronic kidney disease, stage III K -Unknown baseline -Creatinine 1.14 on admission, currently 1.12 -Avoid nephrotoxic agents -Continue to monitor BMP  Essential hypertension -Currently stable -Losartan/HCTZ, amlodipine, metoprolol currently held -Continue hydralazine as needed  Hyponatremia  -Resolved, Serum sodium 134 on admission in setting of dehydration  -Sodium 135 today, continue to monitor BMP  CAD -No complaints of chest pain -s/p bare metal status in February 2017  -Completed 1 month of Plavix, told to discontinue by her cardiologist  -Takes ASA 81 mg qD, held as above in light of GIB  -Monitor on telemetry for  ischemic changes given severe anemia   Hypokalemia  -Potassium 3.4, will replace and continue to monitor BMP  DVT Prophylaxis  SCDs  Code Status: Full  Family Communication: None at bedside  Disposition Plan: Admitted for observation  Consultants Gastroenterology, Dr. Bosie Clos  Procedures  None  Antibiotics   Anti-infectives    None      Subjective:   Felicia Benson seen and examined today.   Patient currently denies any bright red blood per rectum. States her stools are dark. Continues to have some abdominal pain. Denies any dizziness or headache. Did feel some palpitations prior to admission. Currently denies chest pain, shortness of breath, nausea or vomiting.  Objective:   Filed Vitals:   05/30/15 2136 05/30/15 2155 05/31/15 0013 05/31/15 0425  BP: 130/66 130/66 124/59 123/62  Pulse: 73 70 75 69  Temp: 97.8 F (36.6 C) 98.4 F (36.9 C) 98 F (36.7 C) 98 F (36.7 C)  TempSrc: Oral Oral Oral   Resp: Height:      Weight:    93.1 kg (205 lb 4 oz)  SpO2: 97% 100% 98% 98%    Intake/Output Summary (Last 24 hours) at 05/31/15 1252 Last data filed at 05/31/15 0013  Gross per 24 hour  Intake 618.75 ml  Output    600 ml  Net  18.75 ml   Filed Weights   05/30/15 1357 05/31/15 0425  Weight: 90.719 kg (200 lb) 93.1 kg (205 lb 4 oz)    Exam  General: Well developed, well nourished, NAD, appears stated age  HEENT: NCAT, mucous membranes moist.   Cardiovascular: S1 S2 auscultated, no murmurs, RRR  Respiratory: Clear to auscultation bilaterally  Abdomen: Soft, obese, nontender, nondistended, + bowel sounds  Extremities: warm dry  without cyanosis clubbing or edema  Neuro: AAOx3, nonfocal  Psych: Normal affect and demeanor   Data Reviewed: I have personally reviewed following labs and imaging studies  CBC:  Recent Labs Lab 05/30/15 1430 05/30/15 2054 05/31/15 0208  WBC 5.6  --   --   NEUTROABS 3.8  --   --   HGB 7.6* 7.4* 8.3*    HCT 24.7* 24.0* 26.2*  MCV 83.7  --   --   PLT 390  --   --    Basic Metabolic Panel:  Recent Labs Lab 05/30/15 1430 05/31/15 0208  NA 134* 135  K 4.0 3.4*  CL 101 101  CO2 27 28  GLUCOSE 109* 101*  BUN 13 8  CREATININE 1.14* 1.12*  CALCIUM 8.0* 8.1*   GFR: Estimated Creatinine Clearance: 43.7 mL/min (by C-G formula based on Cr of 1.12). Liver Function Tests:  Recent Labs Lab 05/30/15 1430  AST 18  ALT 8*  ALKPHOS 74  BILITOT 0.5  PROT 6.2*  ALBUMIN 2.6*    Recent Labs Lab 05/30/15 1430  LIPASE 33   No results for input(s): AMMONIA in the last 168 hours. Coagulation Profile:  Recent Labs Lab 05/30/15 1552  INR 1.11   Cardiac Enzymes: No results for input(s): CKTOTAL, CKMB, CKMBINDEX, TROPONINI in the last 168 hours. BNP (last 3 results) No results for input(s): PROBNP in the last 8760 hours. HbA1C: No results for input(s): HGBA1C in the last 72 hours. CBG:  Recent Labs Lab 05/31/15 0809  GLUCAP 84   Lipid Profile: No results for input(s): CHOL, HDL, LDLCALC, TRIG, CHOLHDL, LDLDIRECT in the last 72 hours. Thyroid Function Tests: No results for input(s): TSH, T4TOTAL, FREET4, T3FREE, THYROIDAB in the last 72 hours. Anemia Panel: No results for input(s): VITAMINB12, FOLATE, FERRITIN, TIBC, IRON, RETICCTPCT in the last 72 hours. Urine analysis: No results found for: COLORURINE, APPEARANCEUR, LABSPEC, PHURINE, GLUCOSEU, HGBUR, BILIRUBINUR, KETONESUR, PROTEINUR, UROBILINOGEN, NITRITE, LEUKOCYTESUR Sepsis Labs: @LABRCNTIP (procalcitonin:4,lacticidven:4)  )No results found for this or any previous visit (from the past 240 hour(s)).    Radiology Studies: Dg Abd Acute W/chest  05/30/2015  CLINICAL DATA:  Right upper quadrant abdominal pain for several months. EXAM: DG ABDOMEN ACUTE W/ 1V CHEST COMPARISON:  None. FINDINGS: There is no evidence of dilated bowel loops or free intraperitoneal air. Phleboliths are noted in the pelvis. Heart size and  mediastinal contours are within normal limits. Both lungs are clear. IMPRESSION: No evidence of bowel obstruction or ileus. No acute cardiopulmonary disease. Electronically Signed   By: Lupita Raider, M.D.   On: 05/30/2015 17:02     Scheduled Meds: . sodium chloride   Intravenous Once  . pantoprazole (PROTONIX) IV  40 mg Intravenous Q12H  . polyethylene glycol-electrolytes  4,000 mL Oral Once  . sodium chloride flush  3 mL Intravenous Q12H   Continuous Infusions: . sodium chloride 1 mL (05/31/15 1240)     LOS: 1 day   Time Spent in minutes   30 minutes  Tobenna Needs D.O. on 05/31/2015 at 12:52 PM  Between 7am to 7pm - Pager - [phone]  After 7pm go to [email or web address] - password [email or web address]  And look for the night coverage person covering for me after hours  [facility] Office  [phone]

## 2015-06-01 ENCOUNTER — Encounter (HOSPITAL_COMMUNITY): Payer: Self-pay | Admitting: *Deleted

## 2015-06-01 ENCOUNTER — Encounter (HOSPITAL_COMMUNITY)
Admission: EM | Disposition: A | Discharge status: Home / Self Care | Payer: Self-pay | Source: Home / Self Care | Attending: Internal Medicine

## 2015-06-01 ENCOUNTER — Observation Stay (HOSPITAL_COMMUNITY): Payer: Medicare Other | Admitting: Certified Registered Nurse Anesthetist

## 2015-06-01 DIAGNOSIS — N179 Acute kidney failure, unspecified: Secondary | ICD-10-CM | POA: Diagnosis present

## 2015-06-01 DIAGNOSIS — Z7982 Long term (current) use of aspirin: Secondary | ICD-10-CM | POA: Diagnosis not present

## 2015-06-01 DIAGNOSIS — Z950 Presence of cardiac pacemaker: Secondary | ICD-10-CM | POA: Diagnosis not present

## 2015-06-01 DIAGNOSIS — D649 Anemia, unspecified: Secondary | ICD-10-CM | POA: Diagnosis not present

## 2015-06-01 DIAGNOSIS — R109 Unspecified abdominal pain: Secondary | ICD-10-CM | POA: Diagnosis not present

## 2015-06-01 DIAGNOSIS — R1011 Right upper quadrant pain: Secondary | ICD-10-CM | POA: Diagnosis present

## 2015-06-01 DIAGNOSIS — D500 Iron deficiency anemia secondary to blood loss (chronic): Secondary | ICD-10-CM

## 2015-06-01 DIAGNOSIS — K922 Gastrointestinal hemorrhage, unspecified: Secondary | ICD-10-CM | POA: Diagnosis present

## 2015-06-01 DIAGNOSIS — Z87891 Personal history of nicotine dependence: Secondary | ICD-10-CM | POA: Diagnosis not present

## 2015-06-01 DIAGNOSIS — I2510 Atherosclerotic heart disease of native coronary artery without angina pectoris: Secondary | ICD-10-CM | POA: Diagnosis present

## 2015-06-01 DIAGNOSIS — K5090 Crohn's disease, unspecified, without complications: Secondary | ICD-10-CM | POA: Diagnosis present

## 2015-06-01 DIAGNOSIS — E669 Obesity, unspecified: Secondary | ICD-10-CM | POA: Diagnosis present

## 2015-06-01 DIAGNOSIS — K5730 Diverticulosis of large intestine without perforation or abscess without bleeding: Secondary | ICD-10-CM | POA: Diagnosis present

## 2015-06-01 DIAGNOSIS — K2900 Acute gastritis without bleeding: Secondary | ICD-10-CM | POA: Diagnosis present

## 2015-06-01 DIAGNOSIS — Z800 Family history of malignant neoplasm of digestive organs: Secondary | ICD-10-CM | POA: Diagnosis not present

## 2015-06-01 DIAGNOSIS — K644 Residual hemorrhoidal skin tags: Secondary | ICD-10-CM | POA: Diagnosis present

## 2015-06-01 DIAGNOSIS — K449 Diaphragmatic hernia without obstruction or gangrene: Secondary | ICD-10-CM | POA: Diagnosis present

## 2015-06-01 DIAGNOSIS — E871 Hypo-osmolality and hyponatremia: Secondary | ICD-10-CM | POA: Diagnosis present

## 2015-06-01 DIAGNOSIS — Z7951 Long term (current) use of inhaled steroids: Secondary | ICD-10-CM | POA: Diagnosis not present

## 2015-06-01 DIAGNOSIS — D509 Iron deficiency anemia, unspecified: Secondary | ICD-10-CM | POA: Diagnosis present

## 2015-06-01 DIAGNOSIS — Z6832 Body mass index (BMI) 32.0-32.9, adult: Secondary | ICD-10-CM | POA: Diagnosis not present

## 2015-06-01 DIAGNOSIS — D62 Acute posthemorrhagic anemia: Secondary | ICD-10-CM

## 2015-06-01 DIAGNOSIS — K641 Second degree hemorrhoids: Secondary | ICD-10-CM | POA: Diagnosis present

## 2015-06-01 DIAGNOSIS — I10 Essential (primary) hypertension: Secondary | ICD-10-CM | POA: Diagnosis present

## 2015-06-01 DIAGNOSIS — R195 Other fecal abnormalities: Secondary | ICD-10-CM | POA: Diagnosis not present

## 2015-06-01 DIAGNOSIS — Z955 Presence of coronary angioplasty implant and graft: Secondary | ICD-10-CM | POA: Diagnosis not present

## 2015-06-01 DIAGNOSIS — E860 Dehydration: Secondary | ICD-10-CM | POA: Diagnosis present

## 2015-06-01 DIAGNOSIS — Z79899 Other long term (current) drug therapy: Secondary | ICD-10-CM | POA: Diagnosis not present

## 2015-06-01 HISTORY — PX: ESOPHAGOGASTRODUODENOSCOPY (EGD) WITH PROPOFOL: SHX5813

## 2015-06-01 HISTORY — PX: COLONOSCOPY WITH PROPOFOL: SHX5780

## 2015-06-01 LAB — CBC
HEMATOCRIT: 24.1 % — AB (ref 36.0–46.0)
HEMOGLOBIN: 7.7 g/dL — AB (ref 12.0–15.0)
MCH: 26.6 pg (ref 26.0–34.0)
MCHC: 32 g/dL (ref 30.0–36.0)
MCV: 83.1 fL (ref 78.0–100.0)
Platelets: 317 10*3/uL (ref 150–400)
RBC: 2.9 MIL/uL — ABNORMAL LOW (ref 3.87–5.11)
RDW: 16.3 % — ABNORMAL HIGH (ref 11.5–15.5)
WBC: 6.1 10*3/uL (ref 4.0–10.5)

## 2015-06-01 LAB — BASIC METABOLIC PANEL
ANION GAP: 5 (ref 5–15)
BUN: 5 mg/dL — ABNORMAL LOW (ref 6–20)
CHLORIDE: 102 mmol/L (ref 101–111)
CO2: 28 mmol/L (ref 22–32)
Calcium: 8.1 mg/dL — ABNORMAL LOW (ref 8.9–10.3)
Creatinine, Ser: 1.07 mg/dL — ABNORMAL HIGH (ref 0.44–1.00)
GFR calc non Af Amer: 47 mL/min — ABNORMAL LOW (ref >60)
GFR, EST AFRICAN AMERICAN: 54 mL/min — AB (ref >60)
GLUCOSE: 88 mg/dL (ref 65–99)
POTASSIUM: 3.6 mmol/L (ref 3.5–5.1)
Sodium: 135 mmol/L (ref 135–145)

## 2015-06-01 LAB — PREPARE RBC (CROSSMATCH)

## 2015-06-01 SURGERY — ESOPHAGOGASTRODUODENOSCOPY (EGD) WITH PROPOFOL
Anesthesia: Monitor Anesthesia Care

## 2015-06-01 MED ORDER — LIDOCAINE HCL (CARDIAC) 20 MG/ML IV SOLN
INTRAVENOUS | Status: DC | PRN
  Administered 2015-06-01: 60 mg via INTRAVENOUS

## 2015-06-01 MED ORDER — LACTATED RINGERS IV SOLN
INTRAVENOUS | Status: DC | PRN
  Administered 2015-06-01: 08:00:00 via INTRAVENOUS

## 2015-06-01 MED ORDER — GLYCOPYRROLATE 0.2 MG/ML IJ SOLN
INTRAMUSCULAR | Status: DC | PRN
  Administered 2015-06-01: .2 mg via INTRAVENOUS

## 2015-06-01 MED ORDER — FENTANYL CITRATE (PF) 100 MCG/2ML IJ SOLN: 25.0000 ug | INTRAMUSCULAR | Status: DC | PRN

## 2015-06-01 MED ORDER — PROPOFOL 10 MG/ML IV BOLUS
INTRAVENOUS | Status: DC | PRN
  Administered 2015-06-01 (×3): 15 mg via INTRAVENOUS

## 2015-06-01 MED ORDER — PANTOPRAZOLE SODIUM 40 MG PO TBEC
40.0000 mg | DELAYED_RELEASE_TABLET | Freq: Every day | ORAL | Status: DC
Start: 2015-06-01 — End: 2015-06-02
  Administered 2015-06-01: 40 mg via ORAL
  Filled 2015-06-01: qty 1

## 2015-06-01 MED ORDER — PROPOFOL 500 MG/50ML IV EMUL
INTRAVENOUS | Status: DC | PRN
  Administered 2015-06-01: 60 ug/kg/min via INTRAVENOUS

## 2015-06-01 MED ORDER — SODIUM CHLORIDE 0.9 % IV SOLN
Freq: Once | INTRAVENOUS | Status: AC
  Administered 2015-06-01: 12:00:00 via INTRAVENOUS

## 2015-06-01 NOTE — Anesthesia Procedure Notes (Signed)
Procedure Name: MAC Date/Time: 06/01/2015 8:42 AM Performed by: [name] [name] Pre-anesthesia Checklist: Patient identified, Emergency Drugs available, Suction available and Patient being monitored Patient Re-evaluated:Patient Re-evaluated prior to inductionOxygen Delivery Method: Nasal cannula

## 2015-06-01 NOTE — Anesthesia Preprocedure Evaluation (Addendum)
Anesthesia Evaluation  Patient identified by MRN, date of birth, ID band Patient awake    Airway Mallampati: II  TM Distance: >3 FB Neck ROM: Full    Dental  (+) Dental Advisory Given, Edentulous Upper, Edentulous Lower, Upper Dentures, Lower Dentures   Pulmonary former smoker,    breath sounds clear to auscultation       Cardiovascular hypertension, Pt. 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 CE   Endo/Other  negative endocrine ROS  Renal/GU Renal disease     Musculoskeletal   Abdominal   Peds  Hematology  (+) anemia ,   Anesthesia Other Findings   Reproductive/Obstetrics                         Anesthesia Physical Anesthesia Plan  ASA: IV  Anesthesia Plan: MAC   Post-op Pain Management:    Induction: Intravenous  Airway Management Planned: Simple Face Mask  Additional Equipment:   Intra-op Plan:   Post-operative Plan:   Informed Consent: I have reviewed the patients History and Physical, chart, labs and discussed the procedure including the risks, benefits and alternatives for the proposed anesthesia with the patient or authorized representative who has indicated his/her understanding and acceptance.     Plan Discussed with: CRNA and Anesthesiologist  Anesthesia Plan Comments:         Anesthesia Quick Evaluation

## 2015-06-01 NOTE — Progress Notes (Signed)
[facility]                                                                              Patient Demographics  Felicia Benson, is a 82 y.o. female, DOB - 03/07/1930, ONG:[id number]  Admit date - 05/30/2015   Admitting Physician Briscoe Deutscher, MD  Outpatient Primary MD for the patient is Pcp Not In System  Outpatient specialists:   LOS - 2  days    Chief Complaint  Patient presents with  . Abdominal Pain       Brief summary   82 year old with history of Crohn's disease, coronary artery disease, hypertension, presented to the emergency department with complaints of right upper quadrant epigastric pain fatigue and generalized weakness. Patient was recently hospitalized and told her hemoglobin was 9.8 at the end of April. She has been taking iron supplements. [facility] and follows with gastroenterology there, she was set up to have an endoscopy done on May 30. On arrival, patient was found to have a hemoglobin of 7.6, Transfused 1 unit packed RBC. GI was consulted.    Assessment & Plan   GI bleed/acute blood loss anemia/symptomatic anemia -Upon admission, hemoglobin was 7.6, down from 3 weeks prior 9.8 -Continue protonix -Aspirin held, patient denies any use of NSAIDs - GI consulted, EGD, colonoscopy today. EGD showed normal oropharynx, normal esophagus, acute gastritis, biopsies done, small hiatal hernia, normal duodenum - Colonoscopy showed multiple small and large diverticuli in the sigmoid colon, descending colon, internal hemorrhoids, large - Hemoglobin again down to 7.7, will transfuse 1 unit - Restart diet  Crohn's disease -Currently appears to be stable, denies any diarrhea at this time -All medications of mesalamine and Nexium currently held  Acute kidney injury versus chronic kidney disease, stage III K -Unknown baseline - Resolved with IV fluid hydration, avoid nephrotoxic agents  Essential hypertension -BP still borderline,  Losartan/HCTZ, amlodipine, metoprolol currently held -Continue hydralazine as needed  Hyponatremia  -Resolved, Serum sodium 134 on admission in setting of dehydration  -Sodium 135 today, continue to monitor BMP  CAD -No complaints of chest pain -s/p bare metal status in February 2017  -Completed 1 month of Plavix, told to discontinue by her cardiologist  -Takes ASA 81 mg qD, held as above in light of GIB   Code Status: Full code  DVT Prophylaxis: SCD's   Family Communication: Discussed in detail with the patient, all imaging results, lab results explained to the patient    Disposition Plan: home in am  Time Spent in minutes  25 minutes  Procedures:  EGD Colonoscopy  Consultants:   Gastroenterology  Antimicrobials:   None   Medications  Scheduled Meds: . sodium chloride   Intravenous Once  . pantoprazole  40 mg Oral Daily  . sodium chloride flush  3 mL Intravenous Q12H   Continuous Infusions: . sodium chloride 1 mL (05/31/15 1240)   PRN Meds:.acetaminophen **OR** acetaminophen, bisacodyl, hydrALAZINE, HYDROcodone-acetaminophen, ondansetron **OR** ondansetron (ZOFRAN) IV, polyethylene glycol   Antibiotics   Anti-infectives    None        Subjective:   [name] was seen and examined today.  Patient  denies dizziness, chest pain, shortness of breath, abdominal pain, N/V/D/C, new weakness, numbess, tingling. No acute events overnight.    Objective:   Filed Vitals:   06/01/15 0556 06/01/15 0801 06/01/15 0930 06/01/15 0940  BP: 116/54 142/81 85/47 102/60  Pulse: 74 92 98 96  Temp: 98.2 F (36.8 C) 97.6 F (36.4 C) 97.7 F (36.5 C)   TempSrc: Oral Oral Oral   Resp: 16  21 16   Height:      Weight:      SpO2: 98%  94% 99%    Intake/Output Summary (Last 24 hours) at 06/01/15 1159 Last data filed at 06/01/15 0174  Gross per 24 hour  Intake 1204.67 ml  Output      0 ml  Net 1204.67 ml     Wt Readings from Last 3 Encounters:    05/31/15 93.1 kg (205 lb 4 oz)     Exam  General: Alert and oriented x 3, NAD  HEENT:  PERRLA, EOMI, Anicteric Sclera, mucous membranes moist.   Neck: Supple, no JVD, no masses  Cardiovascular: S1 S2 auscultated, no rubs, murmurs or gallops. Regular rate and rhythm.  Respiratory: Clear to auscultation bilaterally, no wheezing, rales or rhonchi  Gastrointestinal: Soft, nontender, nondistended, + bowel sounds  Ext: no cyanosis clubbing or edema  Neuro: AAOx3, Cr N's II- XII. Strength 5/5 upper and lower extremities bilaterally  Skin: No rashes  Psych: Normal affect and demeanor, alert and oriented x3    Data Reviewed:  I have personally reviewed following labs and imaging studies  Micro Results No results found for this or any previous visit (from the past 240 hour(s)).  Radiology Reports Dg Abd Acute W/chest  05/30/2015  CLINICAL DATA:  Right upper quadrant abdominal pain for several months. EXAM: DG ABDOMEN ACUTE W/ 1V CHEST COMPARISON:  None. FINDINGS: There is no evidence of dilated bowel loops or free intraperitoneal air. Phleboliths are noted in the pelvis. Heart size and mediastinal contours are within normal limits. Both lungs are clear. IMPRESSION: No evidence of bowel obstruction or ileus. No acute cardiopulmonary disease. Electronically Signed   By: Lupita Raider, M.D.   On: 05/30/2015 17:02    Lab Data:  CBC:  Recent Labs Lab 05/30/15 1430 05/30/15 2054 05/31/15 0208 06/01/15 0414  WBC 5.6  --   --  6.1  NEUTROABS 3.8  --   --   --   HGB 7.6* 7.4* 8.3* 7.7*  HCT 24.7* 24.0* 26.2* 24.1*  MCV 83.7  --   --  83.1  PLT 390  --   --  317   Basic Metabolic Panel:  Recent Labs Lab 05/30/15 1430 05/31/15 0208 06/01/15 0414  NA 134* 135 135  K 4.0 3.4* 3.6  CL 101 101 102  CO2 27 28 28   GLUCOSE 109* 101* 88  BUN 13 8 5*  CREATININE 1.14* 1.12* 1.07*  CALCIUM 8.0* 8.1* 8.1*   GFR: Estimated Creatinine Clearance: 45.8 mL/min (by C-G formula  based on Cr of 1.07). Liver Function Tests:  Recent Labs Lab 05/30/15 1430  AST 18  ALT 8*  ALKPHOS 74  BILITOT 0.5  PROT 6.2*  ALBUMIN 2.6*    Recent Labs Lab 05/30/15 1430  LIPASE 33   No results for input(s): AMMONIA in the last 168 hours. Coagulation Profile:  Recent Labs Lab 05/30/15 1552  INR 1.11   Cardiac Enzymes: No results for input(s): CKTOTAL, CKMB, CKMBINDEX, TROPONINI in the last 168 hours. BNP (last 3 results)  No results for input(s): PROBNP in the last 8760 hours. HbA1C: No results for input(s): HGBA1C in the last 72 hours. CBG:  Recent Labs Lab 05/31/15 0809  GLUCAP 84   Lipid Profile: No results for input(s): CHOL, HDL, LDLCALC, TRIG, CHOLHDL, LDLDIRECT in the last 72 hours. Thyroid Function Tests: No results for input(s): TSH, T4TOTAL, FREET4, T3FREE, THYROIDAB in the last 72 hours. Anemia Panel: No results for input(s): VITAMINB12, FOLATE, FERRITIN, TIBC, IRON, RETICCTPCT in the last 72 hours. Urine analysis: No results found for: COLORURINE, APPEARANCEUR, LABSPEC, PHURINE, GLUCOSEU, HGBUR, BILIRUBINUR, KETONESUR, PROTEINUR, UROBILINOGEN, NITRITE, Hurshel Party M.D. [facility] 06/01/2015, 11:59 AM  Pager: [phone] Between 7am to 7pm - call Pager - [phone]  After 7pm go to [email or web address] - password [email or web address]  Call night coverage person covering after 7pm

## 2015-06-01 NOTE — Interval H&P Note (Signed)
History and Physical Interval Note:  06/01/2015 8:40 AM  Felicia Benson [name]  has presented today for surgery, with the diagnosis of abd pain; anemia  The various methods of treatment have been discussed with the patient and family. After consideration of risks, benefits and other options for treatment, the patient has consented to  Procedure(s): ESOPHAGOGASTRODUODENOSCOPY (EGD) WITH PROPOFOL (N/A) COLONOSCOPY WITH PROPOFOL (N/A) as a surgical intervention .  The patient's history has been reviewed, patient examined, no change in status, stable for surgery.  I have reviewed the patient's chart and labs.  Questions were answered to the patient's satisfaction.     Coren Crownover C.

## 2015-06-01 NOTE — Op Note (Signed)
[facility] Patient Name: Felicia Benson Procedure Date : 06/01/2015 MRN: [id number] Attending MD: Shirley Friar , MD Date of Birth: [birth year] CSN: [id number] Age: 82 Admit Type: Inpatient Procedure:                Colonoscopy Indications:              Last colonoscopy: date unknown, Generalized                            abdominal pain, Iron deficiency anemia, Personal                            history of Crohn's disease Providers:                Shirley Friar, MD, Dwain Sarna, RN, Lorenda Ishihara, Technician, Kandace Parkins, CRNA Referring MD:              Medicines:                Propofol per Anesthesia, Monitored Anesthesia Care Complications:            No immediate complications. Estimated Blood Loss:     Estimated blood loss: none. Procedure:                Pre-Anesthesia Assessment:                           - Prior to the procedure, a History and Physical                            was performed, and patient medications and                            allergies were reviewed. The patient's tolerance of                            previous anesthesia was also reviewed. The risks                            and benefits of the procedure and the sedation                            options and risks were discussed with the patient.                            All questions were answered, and informed consent                            was obtained. Prior Anticoagulants: The patient has                            taken no previous anticoagulant or antiplatelet  agents. ASA Grade Assessment: III - A patient with                            severe systemic disease. After reviewing the risks                            and benefits, the patient was deemed in                            satisfactory condition to undergo the procedure.                           - Prior to the procedure, a History and Physical                     was performed, and patient medications and                            allergies were reviewed. The patient's tolerance of                            previous anesthesia was also reviewed. The risks                            and benefits of the procedure and the sedation                            options and risks were discussed with the patient.                            All questions were answered, and informed consent                            was obtained. Prior Anticoagulants: The patient has                            taken no previous anticoagulant or antiplatelet                            agents. ASA Grade Assessment: III - A patient with                            severe systemic disease. After reviewing the risks                            and benefits, the patient was deemed in                            satisfactory condition to undergo the procedure.                           After obtaining informed consent, the colonoscope  was passed under direct vision. Throughout the                            procedure, the patient's blood pressure, pulse, and                            oxygen saturations were monitored continuously. The                            EC-[id number] ([id number]) scope was introduced through                            the anus and advanced to the the cecum, identified                            by appendiceal orifice and ileocecal valve. The                            colonoscopy was performed without difficulty. The                            patient tolerated the procedure well. The quality                            of the bowel preparation was fair but repeated                            irrigation led to a good and adequate prep. The                            ileocecal valve, appendiceal orifice, and rectum                            were photographed. Scope In: 9:07:36 AM Scope Out: 9:21:44 AM Scope Withdrawal  Time: 0 hours 8 minutes 7 seconds  Total Procedure Duration: 0 hours 14 minutes 8 seconds  Findings:      Multiple small and large-mouthed diverticula were found in the sigmoid       colon and descending colon.      Internal hemorrhoids were found during retroflexion. The hemorrhoids       were large and Grade II (internal hemorrhoids that prolapse but reduce       spontaneously).      The perianal exam findings include non-thrombosed external hemorrhoids. Impression:               - Diverticulosis in the sigmoid colon and in the                            descending colon.                           - Internal hemorrhoids.                           - Non-thrombosed external hemorrhoids found on  perianal exam.                           - No specimens collected. Moderate Sedation:      N/A - MAC procedure Recommendation:           - Repeat colonoscopy is not recommended for                            screening purposes.                           - High fiber diet.                           - Post procedure medication orders were given. Procedure Code(s):        --- Professional ---                           [phone], Colonoscopy, flexible; diagnostic, including                            collection of specimen(s) by brushing or washing,                            when performed (separate procedure) Diagnosis Code(s):        --- Professional ---                           D50.9, Iron deficiency anemia, unspecified                           Z87.19, Personal history of other diseases of the                            digestive system                           R10.84, Generalized abdominal pain                           K64.4, Residual hemorrhoidal skin tags                           K64.1, Second degree hemorrhoids                           K57.30, Diverticulosis of large intestine without                            perforation or abscess without bleeding CPT  copyright 2016 American Medical Association. All rights reserved. The codes documented in this report are preliminary and upon coder review may  be revised to meet current compliance requirements. Charlott Rakes, MD Shirley Friar, MD 06/01/2015 9:38:09 AM This report has been signed electronically. Number of Addenda: 0

## 2015-06-01 NOTE — H&P (View-Only) (Signed)
Referring Provider: Dr. Catha Gosselin Primary Care Physician:  Pcp Not In System Primary Gastroenterologist:  Gentry Fitz  Reason for Consultation:  Anemia  HPI: Felicia Benson is a 82 y.o. 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 Reports having 2 SBO's years ago and 2 years ago had the sudden onset of rectal bleeding while on Plavix for heart stents. Denies any overt bleeding recently and has been on iron pills with dark to black stools. Has been having recurrent sharp upper abdominal pain that will radiate to her RUQ and the pain will last all day when it occurs. Denies dysphagia, nausea, or vomiting. Reports eating small meals and doing well with that. Reports that she had a Hgb 9.8 on April 29th. Last colonoscopy 4-5 years ago in [place] that reportedly showed benign colon polyps. Two sisters and two brothers all had colon cancer occur in their 62's. Has a pacemaker.  Past Medical History  Diagnosis Date  . Hypertension   . Crohn's disease [facility])   Coronary Artery Disease  Past Surgical History  Procedure Laterality Date  . Cholecystectomy    Pacemaker  Prior to Admission medications   Medication Sig Start Date End Date Taking? Authorizing Provider  amLODipine (NORVASC) 10 MG tablet Take 10 mg by mouth every evening.  05/05/15  Yes Historical Provider, MD  atorvastatin (LIPITOR) 10 MG tablet Take 10 mg by mouth every evening.  04/06/15  Yes Historical Provider, MD  Cholecalciferol (VITAMIN D3) 5000 units TABS Take 5,000 Units by mouth daily.    Yes Historical Provider, MD  esomeprazole (NEXIUM) 40 MG capsule Take 40 mg by mouth 2 (two) times daily. 05/09/15  Yes Historical Provider, MD  losartan-hydrochlorothiazide (HYZAAR) 50-12.5 MG tablet Take 1 tablet by mouth daily. 05/10/15  Yes Historical Provider, MD  mesalamine (PENTASA) 500 MG CR capsule Take 1,500 mg by  mouth 2 (two) times daily.   Yes Historical Provider, MD  metoprolol succinate (TOPROL-XL) 25 MG 24 hr tablet Take 50 mg by mouth 2 (two) times daily. 04/23/15  Yes Historical Provider, MD  mometasone-formoterol (DULERA) 100-5 MCG/ACT AERO Inhale 2 puffs into the lungs 2 (two) times daily.   Yes Historical Provider, MD  montelukast (SINGULAIR) 10 MG tablet Take 10 mg by mouth daily. 04/05/15  Yes Historical Provider, MD    Scheduled Meds: . sodium chloride   Intravenous Once  . pantoprazole (PROTONIX) IV  40 mg Intravenous Q12H  . polyethylene glycol-electrolytes  4,000 mL Oral Once  . sodium chloride flush  3 mL Intravenous Q12H   Continuous Infusions: . sodium chloride     PRN Meds:.acetaminophen **OR** acetaminophen, bisacodyl, hydrALAZINE, HYDROcodone-acetaminophen, ondansetron **OR** ondansetron (ZOFRAN) IV, polyethylene glycol  Allergies as of 05/30/2015  . (No Known Allergies)    Family History  Problem Relation Age of Onset  . Colon cancer Neg Hx     Social History   Social History  . Marital Status: Widowed    Spouse Name: N/A  . Number of Children: N/A  . Years of Education: N/A   Occupational History  . Not on file.   Social History Main Topics  . Smoking status: Former Games developer  . Smokeless tobacco: Not on file  . Alcohol Use: No  . Drug Use: No  . Sexual Activity: Not on file   Other Topics Concern  . Not on file   Social History  Narrative    Review of Systems: All negative except as stated above in HPI.  Physical Exam: Vital signs: Filed Vitals:   05/31/15 0013 05/31/15 0425  BP: 124/59 123/62  Pulse: 75 69  Temp: 98 F (36.7 C) 98 F (36.7 C)  Resp: 16 18   Last BM Date: 05/30/15 General:   Alert,  Well-developed, well-nourished, pleasant and cooperative in NAD Head: atraumatic Eyes: anicteric sclera Lungs:  Clear throughout to auscultation.   No wheezes, crackles, or rhonchi. No acute distress. Heart:  Regular rate and rhythm; no  murmurs, clicks, rubs,  or gallops. Abdomen: soft, nontender, nondistended, +BS  Rectal:  Deferred Ext: no edema  GI:  Lab Results:  Recent Labs  05/30/15 1430 05/30/15 2054 05/31/15 0208  WBC 5.6  --   --   HGB 7.6* 7.4* 8.3*  HCT 24.7* 24.0* 26.2*  PLT 390  --   --    BMET  Recent Labs  05/30/15 1430 05/31/15 0208  NA 134* 135  K 4.0 3.4*  CL 101 101  CO2 27 28  GLUCOSE 109* 101*  BUN 13 8  CREATININE 1.14* 1.12*  CALCIUM 8.0* 8.1*   LFT  Recent Labs  05/30/15 1430  PROT 6.2*  ALBUMIN 2.6*  AST 18  ALT 8*  ALKPHOS 74  BILITOT 0.5   PT/INR  Recent Labs  05/30/15 1552  LABPROT 14.5  INR 1.11     Studies/Results: Dg Abd Acute W/chest  05/30/2015  CLINICAL DATA:  Right upper quadrant abdominal pain for several months. EXAM: DG ABDOMEN ACUTE W/ 1V CHEST COMPARISON:  None. FINDINGS: There is no evidence of dilated bowel loops or free intraperitoneal air. Phleboliths are noted in the pelvis. Heart size and mediastinal contours are within normal limits. Both lungs are clear. IMPRESSION: No evidence of bowel obstruction or ileus. No acute cardiopulmonary disease. Electronically Signed   By: Lupita Raider, M.D.   On: 05/30/2015 17:02    Impression/Plan: 82 yo with a remote history of Crohn's disease (extent not known) being seen for a consult due to anemia and abdominal pain. Doubt a Crohn's exacerbation. Needs an EGD/colonoscopy to further evaluate her anemia and abdominal pain. Colon prep this afternoon. Clear liquid diet. NPO p MN. Patient agreeable to proceed with the EGD/colonoscopy.    LOS: 1 day   Dejah Droessler C.  05/31/2015, 12:18 PM  Pager [phone]  If no answer or after 5 PM call [phone]

## 2015-06-01 NOTE — Op Note (Signed)
[facility] Patient Name: Felicia Benson Procedure Date : 06/01/2015 MRN: [id number] Attending MD: Shirley Friar , MD Date of Birth: August 16, 1930 CSN: [id number] Age: 82 Admit Type: Inpatient Procedure:                Upper GI endoscopy Indications:              Generalized abdominal pain, Iron deficiency anemia Providers:                Shirley Friar, MD, Dwain Sarna, RN, Lorenda Ishihara, Technician, Kandace Parkins, CRNA Referring MD:              Medicines:                Propofol per Anesthesia, Monitored Anesthesia Care Complications:            No immediate complications. Estimated Blood Loss:     Estimated blood loss was minimal. Procedure:                Pre-Anesthesia Assessment:                           - Prior to the procedure, a History and Physical                            was performed, and patient medications and                            allergies were reviewed. The patient's tolerance of                            previous anesthesia was also reviewed. The risks                            and benefits of the procedure and the sedation                            options and risks were discussed with the patient.                            All questions were answered, and informed consent                            was obtained. Prior Anticoagulants: The patient has                            taken no previous anticoagulant or antiplatelet                            agents. ASA Grade Assessment: III - A patient with                            severe systemic disease. After reviewing the risks  and benefits, the patient was deemed in                            satisfactory condition to undergo the procedure.                           After obtaining informed consent, the endoscope was                            passed under direct vision. Throughout the                            procedure, the  patient's blood pressure, pulse, and                            oxygen saturations were monitored continuously. The                            [id number] ([id number]) scope was introduced through the                            mouth, and advanced to the second part of duodenum.                            The upper GI endoscopy was accomplished without                            difficulty. The patient tolerated the procedure                            well. Scope In: Scope Out: Findings:      The oropharynx was normal.      The Z-line was regular and was found 42 cm from the incisors.      The examined esophagus was normal.      Segmental minimal inflammation characterized by congestion (edema) was       found in the prepyloric region of the stomach. Biopsies were taken with       a cold forceps for histology. Estimated blood loss was minimal.      A small hiatal hernia was present.      The exam of the stomach was otherwise normal.      The examined duodenum was normal. Biopsies for histology were taken with       a cold forceps for evaluation of celiac disease. Estimated blood loss       was minimal. Impression:               - Normal oropharynx.                           - Z-line regular, 42 cm from the incisors.                           - Normal esophagus.                           -  Acute gastritis. Biopsied.                           - Small hiatal hernia.                           - Normal examined duodenum. Biopsied. Moderate Sedation:      N/A - MAC procedure Recommendation:           - Await pathology results.                           - Resume previous diet.                           - Post procedure medication orders were given. Procedure Code(s):        --- Professional ---                           [phone], Esophagogastroduodenoscopy, flexible,                            transoral; with biopsy, single or multiple Diagnosis Code(s):        --- Professional ---                            D50.9, Iron deficiency anemia, unspecified                           R10.84, Generalized abdominal pain                           K29.00, Acute gastritis without bleeding                           K44.9, Diaphragmatic hernia without obstruction or                            gangrene CPT copyright 2016 American Medical Association. All rights reserved. The codes documented in this report are preliminary and upon coder review may  be revised to meet current compliance requirements. Charlott Rakes, MD Shirley Friar, MD 06/01/2015 9:33:38 AM This report has been signed electronically. Number of Addenda: 0

## 2015-06-01 NOTE — Transfer of Care (Signed)
Immediate Anesthesia Transfer of Care Note  Patient: Felicia Benson  Procedure(s) Performed: Procedure(s): ESOPHAGOGASTRODUODENOSCOPY (EGD) WITH PROPOFOL (N/A) COLONOSCOPY WITH PROPOFOL (N/A)  Patient Location: Endoscopy Unit  Anesthesia Type:MAC  Level of Consciousness: awake, alert , oriented and patient cooperative  Airway & Oxygen Therapy: Patient Spontanous Breathing and Patient connected to nasal cannula oxygen  Post-op Assessment: Report given to RN and Post -op Vital signs reviewed and stable  Post vital signs: Reviewed and stable  Last Vitals:  Filed Vitals:   06/01/15 0556 06/01/15 0801  BP: 116/54 142/81  Pulse: 74 92  Temp: 36.8 C 36.4 C  Resp: 16     Last Pain:  Filed Vitals:   06/01/15 0802  PainSc: 0-No pain         Complications: No apparent anesthesia complications

## 2015-06-02 ENCOUNTER — Encounter (HOSPITAL_COMMUNITY): Payer: Self-pay | Admitting: Gastroenterology

## 2015-06-02 LAB — TYPE AND SCREEN
ABO/RH(D): A POS
Antibody Screen: NEGATIVE
UNIT DIVISION: 0
UNIT DIVISION: 0

## 2015-06-02 LAB — CBC
HEMATOCRIT: 27.7 % — AB (ref 36.0–46.0)
HEMOGLOBIN: 8.6 g/dL — AB (ref 12.0–15.0)
MCH: 26.4 pg (ref 26.0–34.0)
MCHC: 31 g/dL (ref 30.0–36.0)
MCV: 85 fL (ref 78.0–100.0)
Platelets: 278 10*3/uL (ref 150–400)
RBC: 3.26 MIL/uL — AB (ref 3.87–5.11)
RDW: 16.3 % — AB (ref 11.5–15.5)
WBC: 7 10*3/uL (ref 4.0–10.5)

## 2015-06-02 LAB — BASIC METABOLIC PANEL
ANION GAP: 8 (ref 5–15)
BUN: 6 mg/dL (ref 6–20)
CALCIUM: 8.1 mg/dL — AB (ref 8.9–10.3)
CO2: 25 mmol/L (ref 22–32)
Chloride: 103 mmol/L (ref 101–111)
Creatinine, Ser: 1.06 mg/dL — ABNORMAL HIGH (ref 0.44–1.00)
GFR calc non Af Amer: 47 mL/min — ABNORMAL LOW (ref >60)
GFR, EST AFRICAN AMERICAN: 55 mL/min — AB (ref >60)
GLUCOSE: 85 mg/dL (ref 65–99)
POTASSIUM: 3.8 mmol/L (ref 3.5–5.1)
Sodium: 136 mmol/L (ref 135–145)

## 2015-06-02 LAB — GLUCOSE, CAPILLARY: Glucose-Capillary: 85 mg/dL (ref 65–99)

## 2015-06-02 MED ORDER — METOPROLOL SUCCINATE ER 50 MG PO TB24
50.0000 mg | ORAL_TABLET | Freq: Two times a day (BID) | ORAL | Status: DC
  Administered 2015-06-02 – 2015-06-04 (×4): 50 mg via ORAL
  Filled 2015-06-02 (×6): qty 1

## 2015-06-02 MED ORDER — ATORVASTATIN CALCIUM 10 MG PO TABS
10.0000 mg | ORAL_TABLET | Freq: Every evening | ORAL | Status: DC
  Administered 2015-06-02 – 2015-06-03 (×2): 10 mg via ORAL
  Filled 2015-06-02 (×2): qty 1

## 2015-06-02 MED ORDER — MONTELUKAST SODIUM 10 MG PO TABS
10.0000 mg | ORAL_TABLET | Freq: Every day | ORAL | Status: DC
  Administered 2015-06-02 – 2015-06-04 (×3): 10 mg via ORAL
  Filled 2015-06-02 (×3): qty 1

## 2015-06-02 MED ORDER — VITAMIN D 1000 UNITS PO TABS
5000.0000 [IU] | ORAL_TABLET | Freq: Every day | ORAL | Status: DC
  Administered 2015-06-02 – 2015-06-04 (×3): 5000 [IU] via ORAL
  Filled 2015-06-02 (×3): qty 5

## 2015-06-02 MED ORDER — PANTOPRAZOLE SODIUM 40 MG PO TBEC
40.0000 mg | DELAYED_RELEASE_TABLET | Freq: Two times a day (BID) | ORAL | Status: DC
  Administered 2015-06-02 – 2015-06-04 (×5): 40 mg via ORAL
  Filled 2015-06-02 (×5): qty 1

## 2015-06-02 MED ORDER — MOMETASONE FURO-FORMOTEROL FUM 100-5 MCG/ACT IN AERO
2.0000 | INHALATION_SPRAY | Freq: Two times a day (BID) | RESPIRATORY_TRACT | Status: DC
  Administered 2015-06-02 – 2015-06-04 (×4): 2 via RESPIRATORY_TRACT
  Filled 2015-06-02: qty 8.8

## 2015-06-02 NOTE — Progress Notes (Signed)
Patient ID: Cruzita Lederer, female   DOB: 08/08/1930, 82 y.o.   MRN: [id number] [facility] Progress Note  CORLIS STFLEUR 82 y.o. 1930-06-12   Subjective: Sitting in chair. Frustrated that source of anemia not known. Tolerating solid food. Denies BMs.  Objective: Vital signs in last 24 hours: Filed Vitals:   06/01/15 2115 06/02/15 0424  BP: 115/79 120/57  Pulse: 90 74  Temp: 98.2 F (36.8 C) 98.2 F (36.8 C)  Resp: 18 18    Physical Exam: Gen: alert, no acute distress HEENT: anicteric sclera CV: RRR Chest: CTA B Abd: soft, nontender, nondistended, +BS  Lab Results:  Recent Labs  06/01/15 0414 06/02/15 0614  NA 135 136  K 3.6 3.8  CL 102 103  CO2 28 25  GLUCOSE 88 85  BUN 5* 6  CREATININE 1.07* 1.06*  CALCIUM 8.1* 8.1*    Recent Labs  05/30/15 1430  AST 18  ALT 8*  ALKPHOS 74  BILITOT 0.5  PROT 6.2*  ALBUMIN 2.6*    Recent Labs  05/30/15 1430  06/01/15 0414 06/02/15 0614  WBC 5.6  --  6.1 7.0  NEUTROABS 3.8  --   --   --   HGB 7.6*  < > 7.7* 8.6*  HCT 24.7*  < > 24.1* 27.7*  MCV 83.7  --  83.1 85.0  PLT 390  --  317 278  < > = values in this interval not displayed.  Recent Labs  05/30/15 1552  LABPROT 14.5  INR 1.11      Assessment/Plan: Normocytic anemia without a source seen on colonoscopy/EGD. S/P 1 U PRBCs yesterday and Hgb 8.6 (7.7 on 06/01/15). Will do capsule endoscopy tomorrow to evaluate for a small bowel source. Clear liquid diet this evening and NPO p MN for capsule endoscopy. EGD biopsies pending. Anticipate home on Saturday with Hgb stable and capsule will be read this weekend if possible by the on call doctor.   Jakson Delpilar C. 06/02/2015, 9:28 AM  Pager [phone]  If no answer or after 5 PM call [phone]

## 2015-06-02 NOTE — Anesthesia Postprocedure Evaluation (Signed)
Anesthesia Post Note  Patient: Felicia Benson  Procedure(s) Performed: Procedure(s) (LRB): ESOPHAGOGASTRODUODENOSCOPY (EGD) WITH PROPOFOL (N/A) COLONOSCOPY WITH PROPOFOL (N/A)  Patient location during evaluation: PACU Anesthesia Type: MAC Level of consciousness: awake Pain management: pain level controlled Respiratory status: spontaneous breathing Cardiovascular status: stable Anesthetic complications: no    Last Vitals:  Filed Vitals:   06/01/15 2115 06/02/15 0424  BP: 115/79 120/57  Pulse: 90 74  Temp: 36.8 C 36.8 C  Resp: 18 18    Last Pain:  Filed Vitals:   06/02/15 0720  PainSc: Asleep                 EDWARDS,Labrian Torregrossa

## 2015-06-02 NOTE — Care Management Important Message (Signed)
Important Message  Patient Details  Name: Felicia Benson MRN: [id number] Date of Birth: [birth year]   Medicare Important Message Given:  Yes    Delsin Copen Abena 06/02/2015, 12:00 PM

## 2015-06-02 NOTE — Progress Notes (Signed)
[facility]                                                                              Patient Demographics  Felicia Benson, is a 82 y.o. female, DOB - 1930/12/30, JXB:[id number]  Admit date - 05/30/2015   Admitting Physician Briscoe Deutscher, MD  Outpatient Primary MD for the patient is Pcp Not In System  Outpatient specialists:   LOS - 3  days    Chief Complaint  Patient presents with  . Abdominal Pain       Brief summary   82 year old with history of Crohn's disease, coronary artery disease, hypertension, presented to the emergency department with complaints of right upper quadrant epigastric pain fatigue and generalized weakness. Patient was recently hospitalized and told her hemoglobin was 9.8 at the end of April. She has been taking iron supplements. [facility] and follows with gastroenterology there, she was set up to have an endoscopy done on May 30. On arrival, patient was found to have a hemoglobin of 7.6, Transfused 1 unit packed RBC. GI was consulted.    Assessment & Plan   GI bleed/acute blood loss anemia/symptomatic anemia -Upon admission, hemoglobin was 7.6, down from 3 weeks prior 9.8, down to 7.7 on 5/24, was transfused 1 unit packed RBC. FOBT positive -Continue protonix -Aspirin held, patient denies any use of NSAIDs - GI consulted. EGD showed normal oropharynx, normal esophagus, acute gastritis, biopsies done, small hiatal hernia, normal duodenum - Colonoscopy showed multiple small and large diverticuli in the sigmoid colon, descending colon, internal hemorrhoids - Patient upset that no source has been found although FOBT was positive with acute gastritis on EGD and diverticulosis in colon. GI, Dr. Bosie Clos now recommends getting a capsule endoscopy.  Crohn's disease -Currently appears to be stable, denies any diarrhea at this time, on PPI -All medications of mesalamine and Nexium currently held  Acute kidney injury versus  chronic kidney disease, stage III K - Unknown baseline - Resolved with IV fluid hydration, avoid nephrotoxic agents  Essential hypertension -Restart metoprolol. Continue to hold Losartan/HCTZ, amlodipine -Continue hydralazine as needed  Hyponatremia  -Resolved, Serum sodium 134 on admission in setting of dehydration   CAD -No complaints of chest pain -s/p bare metal status in February 2017  -Completed 1 month of Plavix, told to discontinue by her cardiologist  -Takes ASA 81 mg qD, held as above in light of GIB   Code Status: Full code  DVT Prophylaxis: SCD's   Family Communication: Discussed in detail with the patient, all imaging results, lab results explained to the patient    Disposition Plan:   Time Spent in minutes  25 minutes  Procedures:  EGD Colonoscopy  Consultants:   Gastroenterology  Antimicrobials:   None   Medications  Scheduled Meds: . sodium chloride   Intravenous Once  . pantoprazole  40 mg Oral BID AC  . sodium chloride flush  3 mL Intravenous Q12H   Continuous Infusions: . sodium chloride 1 mL (05/31/15 1240)   PRN Meds:.acetaminophen **OR** acetaminophen, bisacodyl, hydrALAZINE, HYDROcodone-acetaminophen, ondansetron **OR** ondansetron (ZOFRAN) IV, polyethylene glycol   Antibiotics   Anti-infectives  None        Subjective:   Felicia Benson was seen and examined today. Upset that no source of anemia has been found. Patient denies dizziness, chest pain, shortness of breath, abdominal pain, N/V/D/C, new weakness, numbess, tingling. No acute events overnight.  No obvious GI bleed.  Objective:   Filed Vitals:   06/01/15 1330 06/01/15 1705 06/01/15 2115 06/02/15 0424  BP: 126/75 145/70 115/79 120/57  Pulse: 98 73 90 74  Temp: 98.6 F (37 C) 98.3 F (36.8 C) 98.2 F (36.8 C) 98.2 F (36.8 C)  TempSrc: Oral Oral Oral Oral  Resp: Height:      Weight:    91.672 kg (202 lb 1.6 oz)  SpO2: 98% 100% 99% 97%     Intake/Output Summary (Last 24 hours) at 06/02/15 1210 Last data filed at 06/02/15 1020  Gross per 24 hour  Intake    784 ml  Output      0 ml  Net    784 ml     Wt Readings from Last 3 Encounters:  06/02/15 91.672 kg (202 lb 1.6 oz)     Exam  General: Alert and oriented x 3, NAD  HEENT:   Neck:   Cardiovascular: S1 S2 clear, RRR  Respiratory: Clear to auscultation bilaterally, no wheezing, rales or rhonchi  Gastrointestinal: Soft, nontender, nondistended, + bowel sounds  Ext: no cyanosis clubbing or edema  Neuro: no new deficits  Skin: No rashes  Psych: Normal affect and demeanor, alert and oriented x3    Data Reviewed:  I have personally reviewed following labs and imaging studies  Micro Results No results found for this or any previous visit (from the past 240 hour(s)).  Radiology Reports Dg Abd Acute W/chest  05/30/2015  CLINICAL DATA:  Right upper quadrant abdominal pain for several months. EXAM: DG ABDOMEN ACUTE W/ 1V CHEST COMPARISON:  None. FINDINGS: There is no evidence of dilated bowel loops or free intraperitoneal air. Phleboliths are noted in the pelvis. Heart size and mediastinal contours are within normal limits. Both lungs are clear. IMPRESSION: No evidence of bowel obstruction or ileus. No acute cardiopulmonary disease. Electronically Signed   By: Lupita Raider, M.D.   On: 05/30/2015 17:02    Lab Data:  CBC:  Recent Labs Lab 05/30/15 1430 05/30/15 2054 05/31/15 0208 06/01/15 0414 06/02/15 0614  WBC 5.6  --   --  6.1 7.0  NEUTROABS 3.8  --   --   --   --   HGB 7.6* 7.4* 8.3* 7.7* 8.6*  HCT 24.7* 24.0* 26.2* 24.1* 27.7*  MCV 83.7  --   --  83.1 85.0  PLT 390  --   --  317 278   Basic Metabolic Panel:  Recent Labs Lab 05/30/15 1430 05/31/15 0208 06/01/15 0414 06/02/15 0614  NA 134* 135 135 136  K 4.0 3.4* 3.6 3.8  CL 101 101 102 103  CO2 GLUCOSE 109* 101* 88 85  BUN 13 8 5* 6  CREATININE 1.14* 1.12* 1.07*  1.06*  CALCIUM 8.0* 8.1* 8.1* 8.1*   GFR: Estimated Creatinine Clearance: 45.9 mL/min (by C-G formula based on Cr of 1.06). Liver Function Tests:  Recent Labs Lab 05/30/15 1430  AST 18  ALT 8*  ALKPHOS 74  BILITOT 0.5  PROT 6.2*  ALBUMIN 2.6*    Recent Labs Lab 05/30/15 1430  LIPASE 33   No results for input(s): AMMONIA in the  last 168 hours. Coagulation Profile:  Recent Labs Lab 05/30/15 1552  INR 1.11   Cardiac Enzymes: No results for input(s): CKTOTAL, CKMB, CKMBINDEX, TROPONINI in the last 168 hours. BNP (last 3 results) No results for input(s): PROBNP in the last 8760 hours. HbA1C: No results for input(s): HGBA1C in the last 72 hours. CBG:  Recent Labs Lab 05/31/15 0809 06/02/15 0743  GLUCAP 84 85   Lipid Profile: No results for input(s): CHOL, HDL, LDLCALC, TRIG, CHOLHDL, LDLDIRECT in the last 72 hours. Thyroid Function Tests: No results for input(s): TSH, T4TOTAL, FREET4, T3FREE, THYROIDAB in the last 72 hours. Anemia Panel: No results for input(s): VITAMINB12, FOLATE, FERRITIN, TIBC, IRON, RETICCTPCT in the last 72 hours. Urine analysis: No results found for: COLORURINE, APPEARANCEUR, LABSPEC, PHURINE, GLUCOSEU, HGBUR, BILIRUBINUR, KETONESUR, PROTEINUR, UROBILINOGEN, NITRITE, Hurshel Party M.D. [facility] 06/02/2015, 12:10 PM  Pager: [phone] Between 7am to 7pm - call Pager - [phone]  After 7pm go to [email or web address] - password [email or web address]  Call night coverage person covering after 7pm

## 2015-06-03 ENCOUNTER — Encounter (HOSPITAL_COMMUNITY)
Admission: EM | Disposition: A | Discharge status: Home / Self Care | Payer: Self-pay | Source: Home / Self Care | Attending: Internal Medicine

## 2015-06-03 DIAGNOSIS — K50811 Crohn's disease of both small and large intestine with rectal bleeding: Secondary | ICD-10-CM

## 2015-06-03 DIAGNOSIS — I2510 Atherosclerotic heart disease of native coronary artery without angina pectoris: Secondary | ICD-10-CM

## 2015-06-03 HISTORY — PX: GIVENS CAPSULE STUDY: SHX5432

## 2015-06-03 LAB — CBC
HCT: 28.5 % — ABNORMAL LOW (ref 36.0–46.0)
Hemoglobin: 8.9 g/dL — ABNORMAL LOW (ref 12.0–15.0)
MCH: 26.3 pg (ref 26.0–34.0)
MCHC: 31.2 g/dL (ref 30.0–36.0)
MCV: 84.3 fL (ref 78.0–100.0)
PLATELETS: 306 10*3/uL (ref 150–400)
RBC: 3.38 MIL/uL — AB (ref 3.87–5.11)
RDW: 16.3 % — ABNORMAL HIGH (ref 11.5–15.5)
WBC: 7.8 10*3/uL (ref 4.0–10.5)

## 2015-06-03 LAB — BASIC METABOLIC PANEL
ANION GAP: 7 (ref 5–15)
BUN: 7 mg/dL (ref 6–20)
CALCIUM: 7.9 mg/dL — AB (ref 8.9–10.3)
CO2: 26 mmol/L (ref 22–32)
CREATININE: 1.07 mg/dL — AB (ref 0.44–1.00)
Chloride: 103 mmol/L (ref 101–111)
GFR calc non Af Amer: 47 mL/min — ABNORMAL LOW (ref >60)
GFR, EST AFRICAN AMERICAN: 54 mL/min — AB (ref >60)
Glucose, Bld: 93 mg/dL (ref 65–99)
Potassium: 3.7 mmol/L (ref 3.5–5.1)
Sodium: 136 mmol/L (ref 135–145)

## 2015-06-03 LAB — GLUCOSE, CAPILLARY
GLUCOSE-CAPILLARY: 105 mg/dL — AB (ref 65–99)
GLUCOSE-CAPILLARY: 96 mg/dL (ref 65–99)

## 2015-06-03 SURGERY — IMAGING PROCEDURE, GI TRACT, INTRALUMINAL, VIA CAPSULE
Anesthesia: LOCAL

## 2015-06-03 SURGICAL SUPPLY — 1 items: TOWEL COTTON PACK 4EA (MISCELLANEOUS) ×4 IMPLANT

## 2015-06-03 NOTE — Progress Notes (Signed)
[facility]                                                                              Patient Demographics  Felicia Benson, is a 82 y.o. female, DOB - Jan 18, 1930, ZOX:[id number]  Admit date - 05/30/2015   Admitting Physician Briscoe Deutscher, MD  Outpatient Primary MD for the patient is Pcp Not In System  Outpatient specialists:   LOS - 4  days    Chief Complaint  Patient presents with  . Abdominal Pain       Brief summary   82 year old with history of Crohn's disease, coronary artery disease, hypertension, presented to the emergency department with complaints of right upper quadrant epigastric pain fatigue and generalized weakness. Patient was recently hospitalized and told her hemoglobin was 9.8 at the end of April. She has been taking iron supplements. [facility] and follows with gastroenterology there, she was set up to have an endoscopy done on May 30. On arrival, patient was found to have a hemoglobin of 7.6, Transfused 1 unit packed RBC. GI was consulted.    Assessment & Plan   GI bleed/acute blood loss anemia/symptomatic anemia -Upon admission, hemoglobin was 7.6, down from 3 weeks prior 9.8, down to 7.7 on 5/24, was transfused 1 unit packed RBC. FOBT positive -Continue protonix -Aspirin held, patient denies any use of NSAIDs - GI consulted. EGD showed normal oropharynx, normal esophagus, acute gastritis, biopsies done, small hiatal hernia, normal duodenum - Colonoscopy showed multiple small and large diverticuli in the sigmoid colon, descending colon, internal hemorrhoids -Per GI, Capsule endoscopy today, if negative outpatient hematology evaluation and DC home in a.m.  Crohn's disease -Currently appears to be stable, denies any diarrhea at this time, on PPI -All medications of mesalamine and Nexium currently held  Acute kidney injury versus chronic kidney disease, stage III K - Unknown baseline - Resolved with IV fluid hydration,  avoid nephrotoxic agents  Essential hypertension -Restarted metoprolol. Continue to hold Losartan/HCTZ, amlodipine -Continue hydralazine as needed  Hyponatremia  -Resolved, Serum sodium 134 on admission in setting of dehydration   CAD -No complaints of chest pain -s/p bare metal status in February 2017  -Completed 1 month of Plavix, told to discontinue by her cardiologist  -Takes ASA 81 mg qD, held as above in light of GIB   Code Status: Full code  DVT Prophylaxis: SCD's  Family Communication: Discussed in detail with the patient, all imaging results, lab results explained to the patient    Disposition Plan: Likely DC in a.m.  Time Spent in minutes  25 minutes  Procedures:  EGD Colonoscopy  Consultants:   Gastroenterology  Antimicrobials:   None   Medications  Scheduled Meds: . sodium chloride   Intravenous Once  . atorvastatin  10 mg Oral QPM  . cholecalciferol  5,000 Units Oral Daily  . metoprolol succinate  50 mg Oral BID  . mometasone-formoterol  2 puff Inhalation BID  . montelukast  10 mg Oral Daily  . pantoprazole  40 mg Oral BID AC  . sodium chloride flush  3 mL Intravenous Q12H   Continuous Infusions: . sodium chloride 1  mL (05/31/15 1240)   PRN Meds:.acetaminophen **OR** acetaminophen, bisacodyl, hydrALAZINE, HYDROcodone-acetaminophen, ondansetron **OR** ondansetron (ZOFRAN) IV, polyethylene glycol   Antibiotics   Anti-infectives    None        Subjective:   [name] was seen and examined today. No obvious GI bleeding overnight, had a small BM. Denies any specific complaints today. Patient denies dizziness, chest pain, shortness of breath, abdominal pain, N/V/D/C, new weakness, numbess, tingling. No acute events overnight.  Objective:   Filed Vitals:   06/02/15 1302 06/02/15 2103 06/02/15 2106 06/03/15 0549  BP: 125/68  109/66 132/57  Pulse: 86  68 63  Temp: 97.9 F (36.6 C)  98.8 F (37.1 C) 98.7 F (37.1 C)  TempSrc:  Oral  Oral Oral  Resp: 18  18 18   Height:      Weight:    93.9 kg (207 lb 0.2 oz)  SpO2: 96% 93% 97% 97%    Intake/Output Summary (Last 24 hours) at 06/03/15 1045 Last data filed at 06/03/15 0946  Gross per 24 hour  Intake   1112 ml  Output      0 ml  Net   1112 ml     Wt Readings from Last 3 Encounters:  06/03/15 93.9 kg (207 lb 0.2 oz)     Exam  General: Alert and oriented x 3, NAD  HEENT:   Neck:   Cardiovascular: S1 S2 clear, RRR  Respiratory: CTAB, no wheezing  Gastrointestinal: Soft, nontender, nondistended, + bowel sounds  Ext: no cyanosis clubbing or edema  Neuro: no new deficits  Skin: No rashes  Psych: Normal affect and demeanor, alert and oriented x3    Data Reviewed:  I have personally reviewed following labs and imaging studies  Micro Results No results found for this or any previous visit (from the past 240 hour(s)).  Radiology Reports Dg Abd Acute W/chest  05/30/2015  CLINICAL DATA:  Right upper quadrant abdominal pain for several months. EXAM: DG ABDOMEN ACUTE W/ 1V CHEST COMPARISON:  None. FINDINGS: There is no evidence of dilated bowel loops or free intraperitoneal air. Phleboliths are noted in the pelvis. Heart size and mediastinal contours are within normal limits. Both lungs are clear. IMPRESSION: No evidence of bowel obstruction or ileus. No acute cardiopulmonary disease. Electronically Signed   By: Lupita Raider, M.D.   On: 05/30/2015 17:02    Lab Data:  CBC:  Recent Labs Lab 05/30/15 1430 05/30/15 2054 05/31/15 0208 06/01/15 0414 06/02/15 0614 06/03/15 0749  WBC 5.6  --   --  6.1 7.0 7.8  NEUTROABS 3.8  --   --   --   --   --   HGB 7.6* 7.4* 8.3* 7.7* 8.6* 8.9*  HCT 24.7* 24.0* 26.2* 24.1* 27.7* 28.5*  MCV 83.7  --   --  83.1 85.0 84.3  PLT 390  --   --  317 278 306   Basic Metabolic Panel:  Recent Labs Lab 05/30/15 1430 05/31/15 0208 06/01/15 0414 06/02/15 0614 06/03/15 0525  NA 134* 135 135 136 136  K 4.0  3.4* 3.6 3.8 3.7  CL 101 101 102 103 103  CO2 27 28 28 25 26   GLUCOSE 109* 101* 88 85 93  BUN 13 8 5* 6 7  CREATININE 1.14* 1.12* 1.07* 1.06* 1.07*  CALCIUM 8.0* 8.1* 8.1* 8.1* 7.9*   GFR: Estimated Creatinine Clearance: 46 mL/min (by C-G formula based on Cr of 1.07). Liver Function Tests:  Recent Labs Lab 05/30/15 1430  AST 18  ALT 8*  ALKPHOS 74  BILITOT 0.5  PROT 6.2*  ALBUMIN 2.6*    Recent Labs Lab 05/30/15 1430  LIPASE 33   No results for input(s): AMMONIA in the last 168 hours. Coagulation Profile:  Recent Labs Lab 05/30/15 1552  INR 1.11   Cardiac Enzymes: No results for input(s): CKTOTAL, CKMB, CKMBINDEX, TROPONINI in the last 168 hours. BNP (last 3 results) No results for input(s): PROBNP in the last 8760 hours. HbA1C: No results for input(s): HGBA1C in the last 72 hours. CBG:  Recent Labs Lab 05/31/15 0809 06/02/15 0743 06/03/15 0815  GLUCAP 84 85 105*   Lipid Profile: No results for input(s): CHOL, HDL, LDLCALC, TRIG, CHOLHDL, LDLDIRECT in the last 72 hours. Thyroid Function Tests: No results for input(s): TSH, T4TOTAL, FREET4, T3FREE, THYROIDAB in the last 72 hours. Anemia Panel: No results for input(s): VITAMINB12, FOLATE, FERRITIN, TIBC, IRON, RETICCTPCT in the last 72 hours. Urine analysis: No results found for: COLORURINE, APPEARANCEUR, LABSPEC, PHURINE, GLUCOSEU, HGBUR, BILIRUBINUR, KETONESUR, PROTEINUR, UROBILINOGEN, NITRITE, Hurshel Party M.D. [facility] 06/03/2015, 10:45 AM  Pager: [phone] Between 7am to 7pm - call Pager - [phone]  After 7pm go to [email or web address] - password [email or web address]  Call night coverage person covering after 7pm

## 2015-06-03 NOTE — Progress Notes (Signed)
Removed Camera from patient. It is sitting on chair. Patient is comfortable, without any requests. Dorothe Pea, RN

## 2015-06-03 NOTE — Progress Notes (Addendum)
Patient ID: Felicia Benson, female   DOB: [birth year], 82 y.o.   MRN: [id number]  Doing well. No complaints. Reports small BMs overnight.  Capsule for capsule endoscopy swallowed this morning and results will be ready to be read tomorrow. Hgb stable at 8.9. If Hgb stable tomorrow morning, then can go home and capsule results can be called to patient as an outpt but will defer to Dr. Matthias Hughs who is on call this weekend and will be reading the report tomorrow if possible. If capsule study negative for a source of the anemia, then will need an outpt hematology evaluation.

## 2015-06-04 DIAGNOSIS — I10 Essential (primary) hypertension: Secondary | ICD-10-CM

## 2015-06-04 DIAGNOSIS — K921 Melena: Secondary | ICD-10-CM

## 2015-06-04 DIAGNOSIS — R195 Other fecal abnormalities: Secondary | ICD-10-CM

## 2015-06-04 LAB — BASIC METABOLIC PANEL
ANION GAP: 7 (ref 5–15)
BUN: 8 mg/dL (ref 6–20)
CHLORIDE: 101 mmol/L (ref 101–111)
CO2: 26 mmol/L (ref 22–32)
Calcium: 8.2 mg/dL — ABNORMAL LOW (ref 8.9–10.3)
Creatinine, Ser: 0.95 mg/dL (ref 0.44–1.00)
GFR calc non Af Amer: 54 mL/min — ABNORMAL LOW (ref >60)
Glucose, Bld: 97 mg/dL (ref 65–99)
Potassium: 4.2 mmol/L (ref 3.5–5.1)
Sodium: 134 mmol/L — ABNORMAL LOW (ref 135–145)

## 2015-06-04 LAB — CBC
HCT: 30.8 % — ABNORMAL LOW (ref 36.0–46.0)
HEMOGLOBIN: 9.5 g/dL — AB (ref 12.0–15.0)
MCH: 26.1 pg (ref 26.0–34.0)
MCHC: 30.8 g/dL (ref 30.0–36.0)
MCV: 84.6 fL (ref 78.0–100.0)
Platelets: 323 10*3/uL (ref 150–400)
RBC: 3.64 MIL/uL — AB (ref 3.87–5.11)
RDW: 16.3 % — ABNORMAL HIGH (ref 11.5–15.5)
WBC: 10.2 10*3/uL (ref 4.0–10.5)

## 2015-06-04 LAB — GLUCOSE, CAPILLARY: GLUCOSE-CAPILLARY: 110 mg/dL — AB (ref 65–99)

## 2015-06-04 MED ORDER — LOSARTAN POTASSIUM-HCTZ 50-12.5 MG PO TABS: 1.0000 | ORAL_TABLET | Freq: Every day | ORAL | Status: DC

## 2015-06-04 MED ORDER — AMLODIPINE BESYLATE 10 MG PO TABS: 10.0000 mg | ORAL_TABLET | Freq: Every evening | ORAL | Status: DC

## 2015-06-04 MED ORDER — PANTOPRAZOLE SODIUM 40 MG PO TBEC: 40.0000 mg | DELAYED_RELEASE_TABLET | Freq: Two times a day (BID) | ORAL | Status: DC

## 2015-06-04 MED ORDER — TRAMADOL HCL 50 MG PO TABS: 50.0000 mg | ORAL_TABLET | Freq: Three times a day (TID) | ORAL | Status: DC | PRN

## 2015-06-04 NOTE — Discharge Summary (Signed)
Physician Discharge Summary   Patient ID: Felicia Benson MRN: [id number] DOB/AGE: 80-09-1930 80 y.o.  Admit date: 05/30/2015 Discharge date: 06/04/2015  Primary Care Physician:  Pcp Not In System  Discharge Diagnoses:    Acute blood loss anemia, melena  . Abdominal pain . Hypertension . Crohn disease (HCC) . Hyponatremia . Anemia . CAD (coronary artery disease), native coronary artery   Consults:  Gastroenterology  Recommendations for Outpatient Follow-up:  1. GI, [facility] will follow results of the capsule endoscopy and relay to the patient 2. Please repeat CBC/BMET at next visit. If hemoglobin is down, patient will need outpatient hematology evaluation  DIET: Heart healthy diet    Allergies:  No Known Allergies   DISCHARGE MEDICATIONS: Current Discharge Medication List    START taking these medications   Details  pantoprazole (PROTONIX) 40 MG tablet Take 1 tablet (40 mg total) by mouth 2 (two) times daily before a meal. Qty: 60 tablet, Refills: 3    traMADol (ULTRAM) 50 MG tablet Take 1 tablet (50 mg total) by mouth every 8 (eight) hours as needed for moderate pain or severe pain. Qty: 30 tablet, Refills: 0      CONTINUE these medications which have CHANGED   Details  amLODipine (NORVASC) 10 MG tablet Take 1 tablet (10 mg total) by mouth every evening. Hold for next few days    losartan-hydrochlorothiazide (HYZAAR) 50-12.5 MG tablet Take 1 tablet by mouth daily. HOLD for next few days      CONTINUE these medications which have NOT CHANGED   Details  atorvastatin (LIPITOR) 10 MG tablet Take 10 mg by mouth every evening.     Cholecalciferol (VITAMIN D3) 5000 units TABS Take 5,000 Units by mouth daily.     mesalamine (PENTASA) 500 MG CR capsule Take 1,500 mg by mouth 2 (two) times daily.    metoprolol succinate (TOPROL-XL) 25 MG 24 hr tablet Take 50 mg by mouth 2 (two) times daily.    mometasone-formoterol (DULERA) 100-5 MCG/ACT AERO Inhale 2 puffs into the  lungs 2 (two) times daily.    montelukast (SINGULAIR) 10 MG tablet Take 10 mg by mouth daily.      STOP taking these medications     esomeprazole (NEXIUM) 40 MG capsule          Brief H and P: For complete details please refer to admission H and P, but in brief 82 year old with history of Crohn's disease, coronary artery disease, hypertension, presented to the emergency department with complaints of right upper quadrant epigastric pain fatigue and generalized weakness. Patient was recently hospitalized and told her hemoglobin was 9.8 at the end of April. She has been taking iron supplements. [facility] and follows with gastroenterology there, she was set up to have an endoscopy done on May 30. On arrival, patient was found to have a hemoglobin of 7.6, Transfused 1 unit packed RBC. GI was consulted.  Hospital Course:    GI bleed/acute blood loss anemia/symptomatic anemia -Upon admission, hemoglobin was 7.6, down from 3 weeks prior 9.8, down to 7.7 on 5/24, was transfused 1 unit packed RBC. FOBT positive -Continue protonix, - GI was consulted. EGD showed normal oropharynx, normal esophagus, acute gastritis, biopsies done, small hiatal hernia, normal duodenum - Colonoscopy showed multiple small and large diverticuli in the sigmoid colon, descending colon, internal hemorrhoids - GI, Dr. Bosie Clos recommended capsule endoscopy. Per GI, patient will be relayed the results of the video endoscopy. H&H is stable, 9.5 and has been stable for  over 24 hours.  Outpatient CBC will be checked and if trending down, patient was recommended for an outpatient hematology evaluation. She lives in [place] and will discuss with her PCP.  Crohn's disease -Currently appears to be stable, denies any diarrhea at this time, on PPI -Continue mesalamine, PPI   Acute kidney injury versus chronic kidney disease, stage III K - creatinine 1.12, improved to 0.95 at the time of discharge.     Essential hypertension -Restarted metoprolol. Continue to hold Losartan/HCTZ, amlodipine.   Hyponatremia  -Resolved  CAD -No complaints of chest pain -s/p bare metal status in February 2017  -Completed 1 month of Plavix, told to discontinue by her cardiologist    Day of Discharge BP 123/70 mmHg  Pulse 65  Temp(Src) 98.2 F (36.8 C) (Oral)  Resp 18  Ht 5\' 6"  (1.676 m)  Wt 94 kg (207 lb 3.7 oz)  BMI 33.46 kg/m2  SpO2 97%  Physical Exam: General: Alert and awake oriented x3 not in any acute distress. HEENT: anicteric sclera, pupils reactive to light and accommodation CVS: S1-S2 clear no murmur rubs or gallops Chest: clear to auscultation bilaterally, no wheezing rales or rhonchi Abdomen: soft nontender, nondistended, normal bowel sounds Extremities: no cyanosis, clubbing or edema noted bilaterally Neuro: Cranial nerves II-XII intact, no focal neurological deficits   The results of significant diagnostics from this hospitalization (including imaging, microbiology, ancillary and laboratory) are listed below for reference.    LAB RESULTS: Basic Metabolic Panel:  Recent Labs Lab 06/03/15 0525 06/04/15 0636  NA 136 134*  K 3.7 4.2  CL 103 101  CO2 26 26  GLUCOSE 93 97  BUN 7 8  CREATININE 1.07* 0.95  CALCIUM 7.9* 8.2*   Liver Function Tests:  Recent Labs Lab 05/30/15 1430  AST 18  ALT 8*  ALKPHOS 74  BILITOT 0.5  PROT 6.2*  ALBUMIN 2.6*    Recent Labs Lab 05/30/15 1430  LIPASE 33   No results for input(s): AMMONIA in the last 168 hours. CBC:  Recent Labs Lab 05/30/15 1430  06/03/15 0749 06/04/15 0636  WBC 5.6  < > 7.8 10.2  NEUTROABS 3.8  --   --   --   HGB 7.6*  < > 8.9* 9.5*  HCT 24.7*  < > 28.5* 30.8*  MCV 83.7  < > 84.3 84.6  PLT 390  < > 306 323  < > = values in this interval not displayed. Cardiac Enzymes: No results for input(s): CKTOTAL, CKMB, CKMBINDEX, TROPONINI in the last 168 hours. BNP: Invalid input(s):  POCBNP CBG:  Recent Labs Lab 06/03/15 1710 06/04/15 0731  GLUCAP 96 110*    Significant Diagnostic Studies:  Dg Abd Acute W/chest  05/30/2015  CLINICAL DATA:  Right upper quadrant abdominal pain for several months. EXAM: DG ABDOMEN ACUTE W/ 1V CHEST COMPARISON:  None. FINDINGS: There is no evidence of dilated bowel loops or free intraperitoneal air. Phleboliths are noted in the pelvis. Heart size and mediastinal contours are within normal limits. Both lungs are clear. IMPRESSION: No evidence of bowel obstruction or ileus. No acute cardiopulmonary disease. Electronically Signed   By: Lupita Raider, M.D.   On: 05/30/2015 17:02    2D ECHO:   Disposition and Follow-up: Discharge Instructions    Diet - low sodium heart healthy    Complete by:  As directed      Discharge instructions    Complete by:  As directed   Please HOLD amlodipine and hyzaar for next  few days until BP improved.   Please check CBC next week and discuss with primary physician regarding outpatient hematology evaluation if hemoglobin still trending down.     Increase activity slowly    Complete by:  As directed             DISPOSITION: home   DISCHARGE FOLLOW-UP Follow-up Information    Schedule an appointment as soon as possible for a visit in 2 weeks to follow up.   Why:  for hospital follow-up   Contact information:   please follow-up with your primary physician in [place]         Time spent on Discharge:   Signed:   Josetta Wigal M.D. [facility] 06/04/2015, 10:51 AM Pager: [phone]

## 2015-06-04 NOTE — Progress Notes (Signed)
Nsg Discharge Note  Admit Date:  05/30/2015 Discharge date: 06/04/2015   Cruzita Lederer to be D/C'd Home per MD order.  AVS completed.  Copy for chart, and copy for patient signed, and dated. Patient/caregiver able to verbalize understanding.  Discharge Medication:   Medication List    STOP taking these medications        esomeprazole 40 MG capsule  Commonly known as:  NEXIUM      TAKE these medications        amLODipine 10 MG tablet  Commonly known as:  NORVASC  Take 1 tablet (10 mg total) by mouth every evening. Hold for next few days     atorvastatin 10 MG tablet  Commonly known as:  LIPITOR  Take 10 mg by mouth every evening.     losartan-hydrochlorothiazide 50-12.5 MG tablet  Commonly known as:  HYZAAR  Take 1 tablet by mouth daily. HOLD for next few days     metoprolol succinate 25 MG 24 hr tablet  Commonly known as:  TOPROL-XL  Take 50 mg by mouth 2 (two) times daily.     mometasone-formoterol 100-5 MCG/ACT Aero  Commonly known as:  DULERA  Inhale 2 puffs into the lungs 2 (two) times daily.     montelukast 10 MG tablet  Commonly known as:  SINGULAIR  Take 10 mg by mouth daily.     pantoprazole 40 MG tablet  Commonly known as:  PROTONIX  Take 1 tablet (40 mg total) by mouth 2 (two) times daily before a meal.     PENTASA 500 MG CR capsule  Generic drug:  mesalamine  Take 1,500 mg by mouth 2 (two) times daily.     traMADol 50 MG tablet  Commonly known as:  ULTRAM  Take 1 tablet (50 mg total) by mouth every 8 (eight) hours as needed for moderate pain or severe pain.     Vitamin D3 5000 units Tabs  Take 5,000 Units by mouth daily.        Discharge Assessment: Filed Vitals:   06/03/15 2127 06/04/15 0511  BP: 123/73 123/70  Pulse: 76 65  Temp: 98.4 F (36.9 C) 98.2 F (36.8 C)  Resp: 18 18   Skin clean, dry and intact without evidence of skin break down, no evidence of skin tears noted. IV catheter discontinued intact. Site without signs and  symptoms of complications - no redness or edema noted at insertion site, patient denies c/o pain - only slight tenderness at site.  Dressing with slight pressure applied.  D/c Instructions-Education: Discharge instructions given to patient/family with verbalized understanding. D/c education completed with patient/family including follow up instructions, medication list, d/c activities limitations if indicated, with other d/c instructions as indicated by MD - patient able to verbalize understanding, all questions fully answered. Patient instructed to return to ED, call 911, or call MD for any changes in condition.  Patient escorted via WC, and D/C home via private auto.  Camillo Flaming, RN 06/04/2015 11:54 AM

## 2015-06-04 NOTE — Progress Notes (Signed)
Retrieved capsule endoscopy recorder from patient and created video.

## 2015-06-05 ENCOUNTER — Encounter (HOSPITAL_COMMUNITY): Payer: Self-pay | Admitting: Gastroenterology

## 2015-06-13 ENCOUNTER — Other Ambulatory Visit: Payer: Self-pay | Admitting: Gastroenterology

## 2015-06-13 ENCOUNTER — Ambulatory Visit
Admission: RE | Admit: 2015-06-13 | Discharge: 2015-06-13 | Disposition: A | Discharge status: Home / Self Care | Payer: Medicare Other | Source: Ambulatory Visit | Attending: Gastroenterology | Admitting: Gastroenterology

## 2015-06-13 DIAGNOSIS — D649 Anemia, unspecified: Secondary | ICD-10-CM

## 2015-06-13 IMAGING — CR DG ABDOMEN 1V
1 series · 1 of 1 positions shown · non-contrast
Comparison: Acute abdominal series of [DATE]

CLINICAL DATA: Assess passage of endoscopic capsule ingested on [DATE]

EXAM:
ABDOMEN - 1 VIEW

[t abdomen supine]
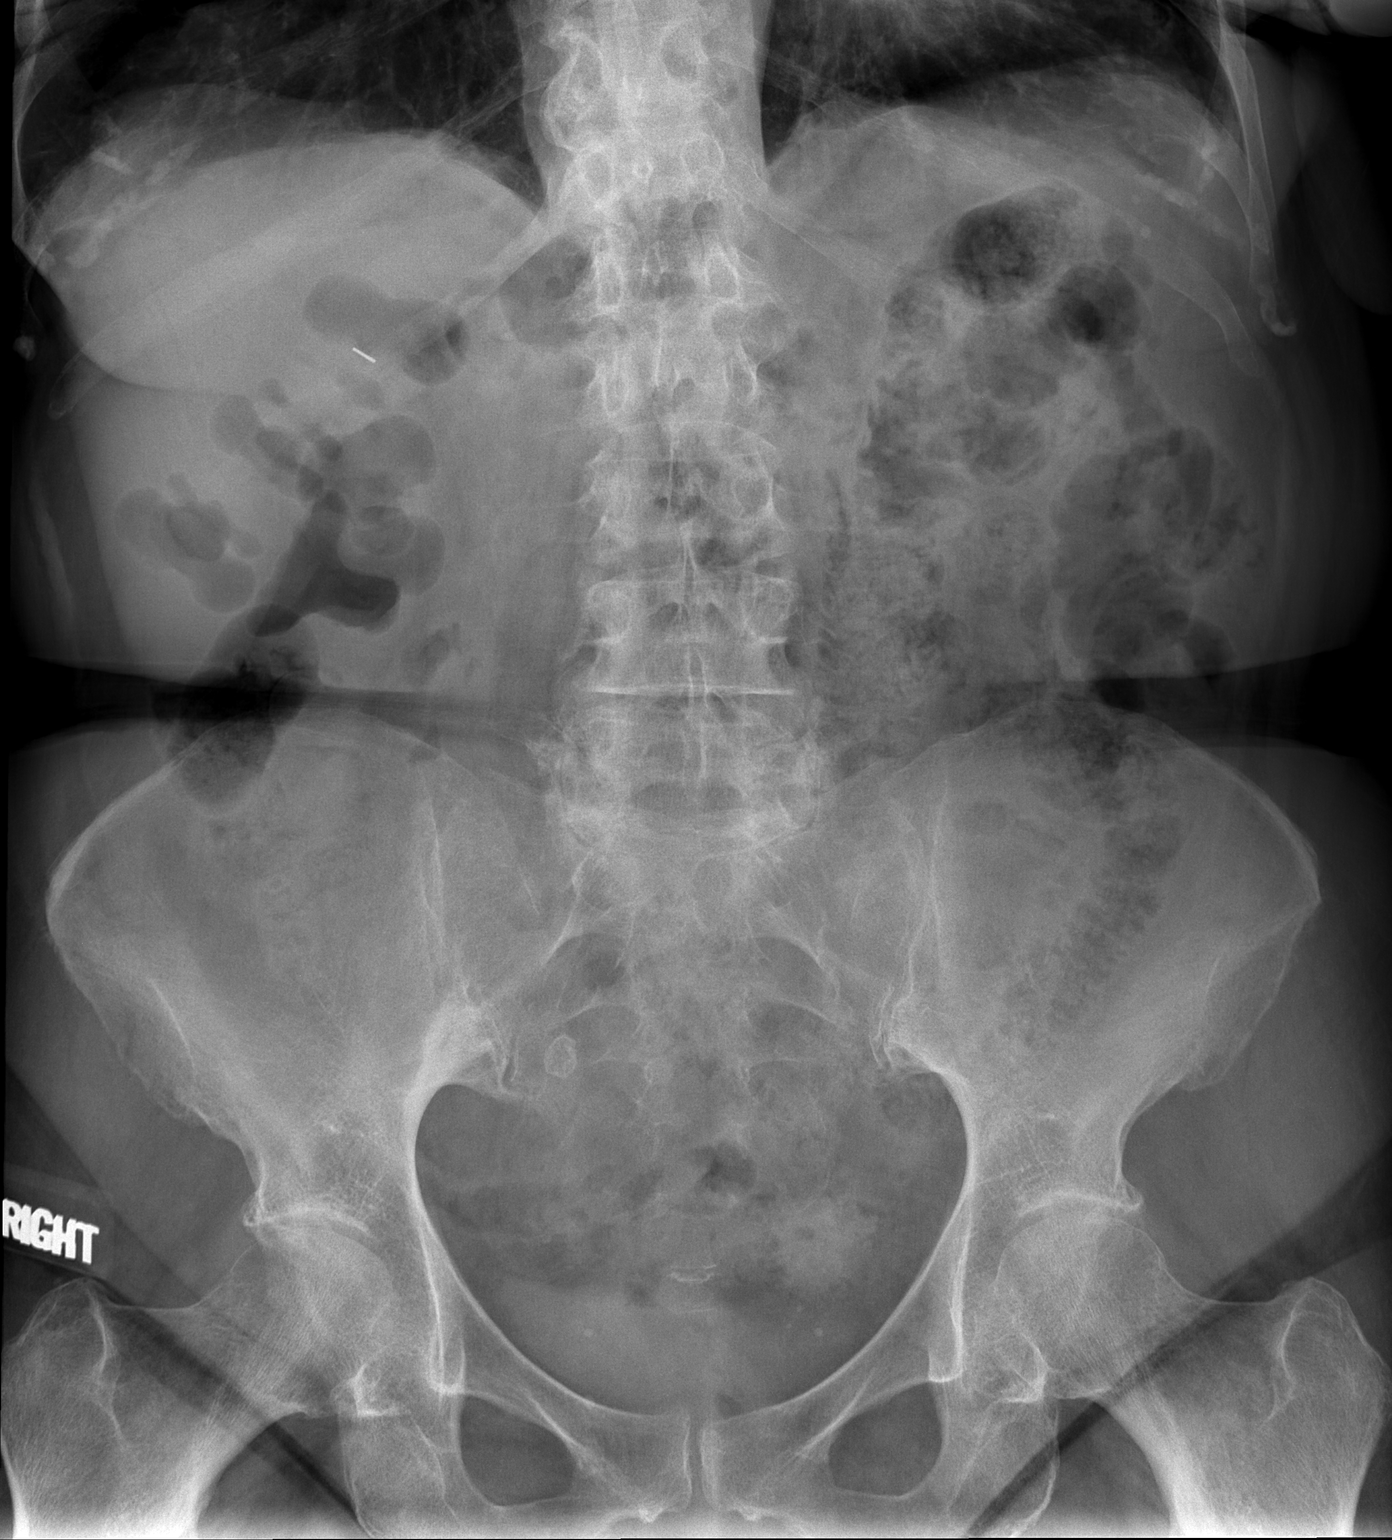

[1 of 1 positions shown; findings below may reference images not displayed]

FINDINGS: The bowel gas pattern is within the limits of normal. No radiopaque
capsule is observed. There small amounts of gas within small bowel
loops without evidence of distention. There is gas and stool in the
rectum. No extraluminal gas collections are observed. There are
degenerative changes of the lumbar spine and both hips.
IMPRESSION: No radiopaque endoscopic capsule is observed on today's study. There
is no evidence of bowel obstruction.

## 2019-05-18 ENCOUNTER — Inpatient Hospital Stay (HOSPITAL_COMMUNITY)
Admission: EM | Admit: 2019-05-18 | Discharge: 2019-05-21 | DRG: 291 | Disposition: A | Discharge status: Home Health | Payer: Medicare Other | Attending: Student | Admitting: Student

## 2019-05-18 ENCOUNTER — Other Ambulatory Visit: Payer: Self-pay

## 2019-05-18 ENCOUNTER — Observation Stay (HOSPITAL_COMMUNITY): Payer: Medicare Other

## 2019-05-18 ENCOUNTER — Emergency Department (HOSPITAL_COMMUNITY): Payer: Medicare Other

## 2019-05-18 ENCOUNTER — Encounter (HOSPITAL_COMMUNITY): Payer: Self-pay | Admitting: Internal Medicine

## 2019-05-18 DIAGNOSIS — Z7951 Long term (current) use of inhaled steroids: Secondary | ICD-10-CM

## 2019-05-18 DIAGNOSIS — I509 Heart failure, unspecified: Secondary | ICD-10-CM | POA: Diagnosis not present

## 2019-05-18 DIAGNOSIS — K219 Gastro-esophageal reflux disease without esophagitis: Secondary | ICD-10-CM | POA: Diagnosis present

## 2019-05-18 DIAGNOSIS — I5031 Acute diastolic (congestive) heart failure: Secondary | ICD-10-CM

## 2019-05-18 DIAGNOSIS — Z950 Presence of cardiac pacemaker: Secondary | ICD-10-CM

## 2019-05-18 DIAGNOSIS — I10 Essential (primary) hypertension: Secondary | ICD-10-CM | POA: Diagnosis present

## 2019-05-18 DIAGNOSIS — G4733 Obstructive sleep apnea (adult) (pediatric): Secondary | ICD-10-CM | POA: Diagnosis present

## 2019-05-18 DIAGNOSIS — D649 Anemia, unspecified: Secondary | ICD-10-CM | POA: Diagnosis not present

## 2019-05-18 DIAGNOSIS — I110 Hypertensive heart disease with heart failure: Secondary | ICD-10-CM | POA: Diagnosis not present

## 2019-05-18 DIAGNOSIS — I5032 Chronic diastolic (congestive) heart failure: Secondary | ICD-10-CM | POA: Diagnosis present

## 2019-05-18 DIAGNOSIS — J9621 Acute and chronic respiratory failure with hypoxia: Secondary | ICD-10-CM | POA: Diagnosis not present

## 2019-05-18 DIAGNOSIS — I714 Abdominal aortic aneurysm, without rupture, unspecified: Secondary | ICD-10-CM | POA: Diagnosis present

## 2019-05-18 DIAGNOSIS — R0602 Shortness of breath: Secondary | ICD-10-CM

## 2019-05-18 DIAGNOSIS — K5090 Crohn's disease, unspecified, without complications: Secondary | ICD-10-CM | POA: Diagnosis present

## 2019-05-18 DIAGNOSIS — J449 Chronic obstructive pulmonary disease, unspecified: Secondary | ICD-10-CM | POA: Diagnosis present

## 2019-05-18 DIAGNOSIS — Z79899 Other long term (current) drug therapy: Secondary | ICD-10-CM

## 2019-05-18 DIAGNOSIS — Z6833 Body mass index (BMI) 33.0-33.9, adult: Secondary | ICD-10-CM

## 2019-05-18 DIAGNOSIS — J441 Chronic obstructive pulmonary disease with (acute) exacerbation: Secondary | ICD-10-CM | POA: Diagnosis present

## 2019-05-18 DIAGNOSIS — Z20822 Contact with and (suspected) exposure to covid-19: Secondary | ICD-10-CM | POA: Diagnosis present

## 2019-05-18 DIAGNOSIS — Z7982 Long term (current) use of aspirin: Secondary | ICD-10-CM

## 2019-05-18 DIAGNOSIS — Z87891 Personal history of nicotine dependence: Secondary | ICD-10-CM

## 2019-05-18 DIAGNOSIS — J90 Pleural effusion, not elsewhere classified: Secondary | ICD-10-CM

## 2019-05-18 DIAGNOSIS — J9611 Chronic respiratory failure with hypoxia: Secondary | ICD-10-CM | POA: Diagnosis present

## 2019-05-18 DIAGNOSIS — K50819 Crohn's disease of both small and large intestine with unspecified complications: Secondary | ICD-10-CM

## 2019-05-18 DIAGNOSIS — E871 Hypo-osmolality and hyponatremia: Secondary | ICD-10-CM | POA: Diagnosis present

## 2019-05-18 DIAGNOSIS — E669 Obesity, unspecified: Secondary | ICD-10-CM | POA: Diagnosis present

## 2019-05-18 DIAGNOSIS — R188 Other ascites: Secondary | ICD-10-CM | POA: Diagnosis present

## 2019-05-18 DIAGNOSIS — E785 Hyperlipidemia, unspecified: Secondary | ICD-10-CM | POA: Diagnosis present

## 2019-05-18 DIAGNOSIS — I5033 Acute on chronic diastolic (congestive) heart failure: Secondary | ICD-10-CM | POA: Diagnosis present

## 2019-05-18 HISTORY — DX: Abdominal aortic aneurysm, without rupture: I71.4

## 2019-05-18 HISTORY — DX: Gastrointestinal hemorrhage, unspecified: K92.2

## 2019-05-18 HISTORY — DX: Abdominal aortic aneurysm, without rupture, unspecified: I71.40

## 2019-05-18 HISTORY — DX: Nontoxic single thyroid nodule: E04.1

## 2019-05-18 HISTORY — DX: Diverticulitis of intestine, part unspecified, without perforation or abscess without bleeding: K57.92

## 2019-05-18 HISTORY — DX: Gastro-esophageal reflux disease without esophagitis: K21.9

## 2019-05-18 HISTORY — DX: Diverticulosis of intestine, part unspecified, without perforation or abscess without bleeding: K57.90

## 2019-05-18 HISTORY — DX: Obstructive sleep apnea (adult) (pediatric): G47.33

## 2019-05-18 HISTORY — DX: Presence of cardiac pacemaker: Z95.0

## 2019-05-18 HISTORY — DX: Heart failure, unspecified: I50.9

## 2019-05-18 LAB — CBC WITH DIFFERENTIAL/PLATELET
Abs Immature Granulocytes: 0.03 10*3/uL (ref 0.00–0.07)
Basophils Absolute: 0 10*3/uL (ref 0.0–0.1)
Basophils Relative: 0 %
Eosinophils Absolute: 0.1 10*3/uL (ref 0.0–0.5)
Eosinophils Relative: 2 %
HCT: 36.6 % (ref 36.0–46.0)
Hemoglobin: 11.2 g/dL — ABNORMAL LOW (ref 12.0–15.0)
Immature Granulocytes: 1 %
Lymphocytes Relative: 18 %
Lymphs Abs: 1.2 10*3/uL (ref 0.7–4.0)
MCH: 27.8 pg (ref 26.0–34.0)
MCHC: 30.6 g/dL (ref 30.0–36.0)
MCV: 90.8 fL (ref 80.0–100.0)
Monocytes Absolute: 0.8 10*3/uL (ref 0.1–1.0)
Monocytes Relative: 12 %
Neutro Abs: 4.4 10*3/uL (ref 1.7–7.7)
Neutrophils Relative %: 67 %
Platelets: 266 10*3/uL (ref 150–400)
RBC: 4.03 MIL/uL (ref 3.87–5.11)
RDW: 16.7 % — ABNORMAL HIGH (ref 11.5–15.5)
WBC: 6.6 10*3/uL (ref 4.0–10.5)
nRBC: 0 % (ref 0.0–0.2)

## 2019-05-18 LAB — BASIC METABOLIC PANEL
Anion gap: 10 (ref 5–15)
BUN: 16 mg/dL (ref 8–23)
CO2: 24 mmol/L (ref 22–32)
Calcium: 8.1 mg/dL — ABNORMAL LOW (ref 8.9–10.3)
Chloride: 100 mmol/L (ref 98–111)
Creatinine, Ser: 1.1 mg/dL — ABNORMAL HIGH (ref 0.44–1.00)
GFR calc Af Amer: 52 mL/min — ABNORMAL LOW (ref >60)
GFR calc non Af Amer: 45 mL/min — ABNORMAL LOW (ref >60)
Glucose, Bld: 97 mg/dL (ref 70–99)
Potassium: 4.9 mmol/L (ref 3.5–5.1)
Sodium: 134 mmol/L — ABNORMAL LOW (ref 135–145)

## 2019-05-18 LAB — ECHOCARDIOGRAM COMPLETE
Height: 66 in
Weight: 3200 oz

## 2019-05-18 LAB — TROPONIN I (HIGH SENSITIVITY)
Troponin I (High Sensitivity): 8 ng/L (ref <18)
Troponin I (High Sensitivity): 8 ng/L (ref <18)

## 2019-05-18 LAB — TSH: TSH: 0.699 u[IU]/mL (ref 0.350–4.500)

## 2019-05-18 LAB — RESPIRATORY PANEL BY RT PCR (FLU A&B, COVID)
Influenza A by PCR: NEGATIVE
Influenza B by PCR: NEGATIVE
SARS Coronavirus 2 by RT PCR: NEGATIVE

## 2019-05-18 LAB — BRAIN NATRIURETIC PEPTIDE: B Natriuretic Peptide: 247.7 pg/mL — ABNORMAL HIGH (ref 0.0–100.0)

## 2019-05-18 IMAGING — DX DG CHEST 1V PORT
1 series · 1 of 1 positions shown · non-contrast
Comparison: None.

CLINICAL DATA: Chest pain and shortness of breath.

EXAM:
PORTABLE CHEST 1 VIEW

[chest]
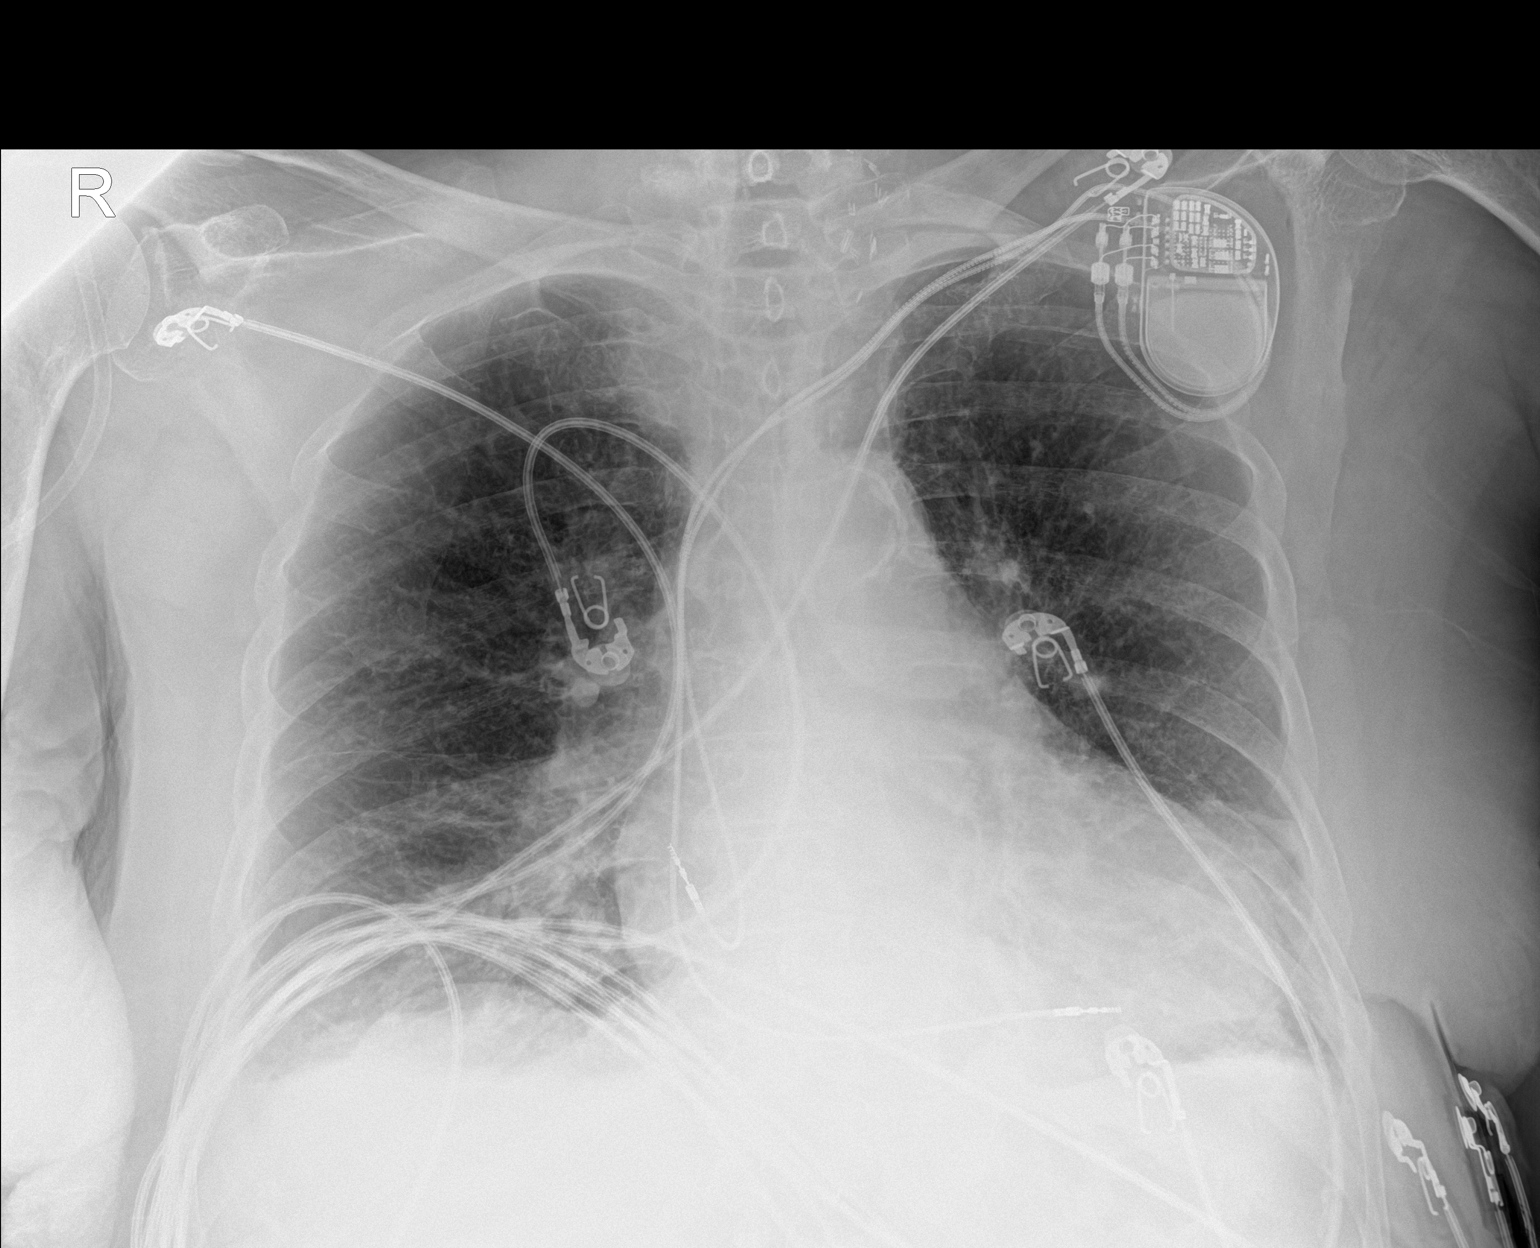

[1 of 1 positions shown; findings below may reference images not displayed]

FINDINGS: Heart is enlarged. Mild pulmonary vascular congestion is present
without frank edema. Atherosclerotic changes are noted at the arch.
Small effusions are present. Bibasilar airspace opacities are noted.
The upper lung fields are clear. Pacing wires are in satisfactory
position. Surgical clips are present along the left neck.
IMPRESSION: 1. Cardiomegaly with mild pulmonary vascular congestion and small
bilateral pleural effusions compatible with congestive heart
failure.
2. Bibasilar airspace disease likely reflects atelectasis. Infection
is not excluded.
3. Atherosclerosis of the thoracic aorta.

## 2019-05-18 MED ORDER — ATORVASTATIN CALCIUM 10 MG PO TABS
20.0000 mg | ORAL_TABLET | Freq: Every day | ORAL | Status: DC
  Administered 2019-05-18 – 2019-05-21 (×4): 20 mg via ORAL
  Filled 2019-05-18 (×4): qty 2

## 2019-05-18 MED ORDER — ASPIRIN 81 MG PO CHEW
324.0000 mg | CHEWABLE_TABLET | Freq: Once | ORAL | Status: AC
Start: 2019-05-18 — End: 2019-05-18
  Administered 2019-05-18: 05:00:00 324 mg via ORAL
  Filled 2019-05-18: qty 4

## 2019-05-18 MED ORDER — MONTELUKAST SODIUM 10 MG PO TABS
10.0000 mg | ORAL_TABLET | Freq: Every day | ORAL | Status: DC
  Administered 2019-05-18 – 2019-05-21 (×4): 10 mg via ORAL
  Filled 2019-05-18 (×4): qty 1

## 2019-05-18 MED ORDER — MESALAMINE ER 250 MG PO CPCR
1500.0000 mg | ORAL_CAPSULE | Freq: Two times a day (BID) | ORAL | Status: DC
  Administered 2019-05-18 – 2019-05-21 (×7): 1500 mg via ORAL
  Filled 2019-05-18 (×9): qty 6

## 2019-05-18 MED ORDER — DIPHENHYDRAMINE HCL 25 MG PO CAPS
25.0000 mg | ORAL_CAPSULE | Freq: Every evening | ORAL | Status: DC | PRN
  Administered 2019-05-18: 25 mg via ORAL
  Filled 2019-05-18 (×2): qty 1

## 2019-05-18 MED ORDER — NITROGLYCERIN 0.4 MG SL SUBL: 0.4000 mg | SUBLINGUAL_TABLET | SUBLINGUAL | Status: DC | PRN

## 2019-05-18 MED ORDER — ASPIRIN EC 81 MG PO TBEC
81.0000 mg | DELAYED_RELEASE_TABLET | Freq: Every day | ORAL | Status: DC
  Administered 2019-05-19 – 2019-05-21 (×3): 81 mg via ORAL
  Filled 2019-05-18 (×3): qty 1

## 2019-05-18 MED ORDER — PANTOPRAZOLE SODIUM 40 MG PO TBEC
40.0000 mg | DELAYED_RELEASE_TABLET | Freq: Every day | ORAL | Status: DC
  Administered 2019-05-18 – 2019-05-21 (×4): 40 mg via ORAL
  Filled 2019-05-18 (×4): qty 1

## 2019-05-18 MED ORDER — ENOXAPARIN SODIUM 40 MG/0.4ML ~~LOC~~ SOLN
40.0000 mg | SUBCUTANEOUS | Status: DC
  Filled 2019-05-18 (×2): qty 0.4

## 2019-05-18 MED ORDER — ONDANSETRON HCL 4 MG/2ML IJ SOLN: 4.0000 mg | Freq: Four times a day (QID) | INTRAMUSCULAR | Status: DC | PRN

## 2019-05-18 MED ORDER — LOSARTAN POTASSIUM 50 MG PO TABS
100.0000 mg | ORAL_TABLET | Freq: Every day | ORAL | Status: DC
  Administered 2019-05-18 – 2019-05-21 (×4): 100 mg via ORAL
  Filled 2019-05-18 (×4): qty 2

## 2019-05-18 MED ORDER — FUROSEMIDE 10 MG/ML IJ SOLN
40.0000 mg | Freq: Once | INTRAMUSCULAR | Status: AC
Start: 2019-05-18 — End: 2019-05-18
  Administered 2019-05-18: 40 mg via INTRAVENOUS
  Filled 2019-05-18: qty 4

## 2019-05-18 MED ORDER — SODIUM CHLORIDE 0.9 % IV SOLN: 250.0000 mL | INTRAVENOUS | Status: DC | PRN

## 2019-05-18 MED ORDER — FUROSEMIDE 10 MG/ML IJ SOLN
40.0000 mg | Freq: Two times a day (BID) | INTRAMUSCULAR | Status: DC
  Administered 2019-05-18: 40 mg via INTRAVENOUS
  Filled 2019-05-18: qty 4

## 2019-05-18 MED ORDER — ACETAMINOPHEN 500 MG PO TABS
500.0000 mg | ORAL_TABLET | Freq: Every evening | ORAL | Status: DC | PRN
  Administered 2019-05-18 – 2019-05-19 (×2): 500 mg via ORAL
  Filled 2019-05-18 (×2): qty 1

## 2019-05-18 MED ORDER — METOPROLOL SUCCINATE ER 50 MG PO TB24
50.0000 mg | ORAL_TABLET | Freq: Two times a day (BID) | ORAL | Status: DC
  Administered 2019-05-18 – 2019-05-20 (×5): 50 mg via ORAL
  Filled 2019-05-18 (×3): qty 1
  Filled 2019-05-18: qty 2
  Filled 2019-05-18: qty 1

## 2019-05-18 MED ORDER — ACETAMINOPHEN 325 MG PO TABS
650.0000 mg | ORAL_TABLET | ORAL | Status: DC | PRN
  Filled 2019-05-18: qty 2

## 2019-05-18 MED ORDER — HYPROMELLOSE (GONIOSCOPIC) 2.5 % OP SOLN: 1.0000 [drp] | Freq: Two times a day (BID) | OPHTHALMIC | Status: DC | PRN

## 2019-05-18 MED ORDER — MOMETASONE FURO-FORMOTEROL FUM 200-5 MCG/ACT IN AERO
2.0000 | INHALATION_SPRAY | Freq: Two times a day (BID) | RESPIRATORY_TRACT | Status: DC
  Administered 2019-05-19 – 2019-05-21 (×5): 2 via RESPIRATORY_TRACT
  Filled 2019-05-18 (×2): qty 8.8

## 2019-05-18 MED ORDER — HYDRALAZINE HCL 25 MG PO TABS: 50.0000 mg | ORAL_TABLET | Freq: Three times a day (TID) | ORAL | Status: DC

## 2019-05-18 MED ORDER — SODIUM CHLORIDE 0.9% FLUSH
3.0000 mL | Freq: Two times a day (BID) | INTRAVENOUS | Status: DC
  Administered 2019-05-18 – 2019-05-21 (×7): 3 mL via INTRAVENOUS

## 2019-05-18 MED ORDER — SODIUM CHLORIDE 0.9% FLUSH
3.0000 mL | INTRAVENOUS | Status: DC | PRN
  Administered 2019-05-18: 3 mL via INTRAVENOUS

## 2019-05-18 NOTE — Progress Notes (Signed)
  Echocardiogram 2D Echocardiogram has been performed.  Gerda Diss 05/18/2019, 2:26 PM

## 2019-05-18 NOTE — ED Provider Notes (Signed)
TIME SEEN: 3:50 AM  CHIEF COMPLAINT: Chest tightness, shortness of breath, peripheral edema  HPI: Patient is an 86 year old female with history of hypertension, CHF who wears oxygen as needed who presents to the emergency department today with chest tightness and shortness of breath that started yesterday morning.  She has noticed increased ankle swelling that has not improved with rest and elevation.  She is not on a diuretic chronically.  She is not sure what her ejection fraction is.  States she is followed by cardiology in [place].  She is here visiting her daughter with plans to return in the morning.  Denies fever or cough.  No chest tightness currently.  ROS: See HPI Constitutional: no fever  Eyes: no drainage  ENT: no runny nose   Cardiovascular:   chest pain  Resp:  SOB  GI: no vomiting GU: no dysuria Integumentary: no rash  Allergy: no hives  Musculoskeletal:  leg swelling  Neurological: no slurred speech ROS otherwise negative  PAST MEDICAL HISTORY/PAST SURGICAL HISTORY:  Past Medical History:  Diagnosis Date  . Crohn's disease ([place])   . Hypertension     MEDICATIONS:  Prior to Admission medications   Medication Sig Start Date End Date Taking? Authorizing Provider  amLODipine (NORVASC) 10 MG tablet Take 1 tablet (10 mg total) by mouth every evening. Hold for next few days 06/04/15   Rai, Vernelle Emerald, MD  atorvastatin (LIPITOR) 10 MG tablet Take 10 mg by mouth every evening.  04/06/15   [provider]  Cholecalciferol (VITAMIN D3) 5000 units TABS Take 5,000 Units by mouth daily.     [provider]  losartan-hydrochlorothiazide (HYZAAR) 50-12.5 MG tablet Take 1 tablet by mouth daily. HOLD for next few days 06/04/15   Rai, Vernelle Emerald, MD  mesalamine (PENTASA) 500 MG CR capsule Take 1,500 mg by mouth 2 (two) times daily.    [provider]  metoprolol succinate (TOPROL-XL) 25 MG 24 hr tablet Take 50 mg by mouth 2 (two) times daily. 04/23/15    [provider]  mometasone-formoterol (DULERA) 100-5 MCG/ACT AERO Inhale 2 puffs into the lungs 2 (two) times daily.    [provider]  montelukast (SINGULAIR) 10 MG tablet Take 10 mg by mouth daily. 04/05/15   [provider]  pantoprazole (PROTONIX) 40 MG tablet Take 1 tablet (40 mg total) by mouth 2 (two) times daily before a meal. 06/04/15   Rai, Ripudeep K, MD  traMADol (ULTRAM) 50 MG tablet Take 1 tablet (50 mg total) by mouth every 8 (eight) hours as needed for moderate pain or severe pain. 06/04/15   Rai, Vernelle Emerald, MD    ALLERGIES:  No Known Allergies  SOCIAL HISTORY:  Social History   Tobacco Use  . Smoking status: Former Smoker  Substance Use Topics  . Alcohol use: No    FAMILY HISTORY: Family History  Problem Relation Age of Onset  . Colon cancer Neg Hx     EXAM: BP 131/65 (BP Location: Right Arm)   Pulse 61   Temp 98.5 F (36.9 C) (Oral)   Resp 20   Ht 5\' 6"  (1.676 m)   Wt 90.7 kg   SpO2 90%   BMI 32.28 kg/m  CONSTITUTIONAL: Alert and oriented and responds appropriately to questions.  Elderly, no distress HEAD: Normocephalic EYES: Conjunctivae clear, pupils appear equal, EOM appear intact ENT: normal nose; moist mucous membranes NECK: Supple, normal ROM CARD: RRR; S1 and S2 appreciated; no murmurs, no clicks, no rubs, no  gallops RESP: Normal chest excursion without splinting or tachypnea; breath sounds clear and equal bilaterally; no wheezes, no rhonchi, no rales, oxygen saturations intermittently drop into the upper 80s/low 90s on 2 L nasal cannula ABD/GI: Normal bowel sounds; non-distended; soft, non-tender, no rebound, no guarding, no peritoneal signs, no hepatosplenomegaly BACK:  The back appears normal EXT: Normal ROM in all joints; no deformity noted, nonpitting edema noted to her bilateral lower extremities and bilateral upper extremities, compartments soft, extremities warm and well-perfused SKIN: Normal color for age and  race; warm; no rash on exposed skin NEURO: Moves all extremities equally PSYCH: The patient's mood and manner are appropriate.   MEDICAL DECISION MAKING: Patient here with chest pain, shortness of breath and signs of peripheral volume overload.  Will obtain cardiac labs, chest x-ray.  EKG reviewed/interpreted and shows paced rhythm.  We will interrogate her pacemaker.  Currently chest pain-free.  Will give aspirin.  Anticipate that she will need admission for chest pain rule out and diuresis.   ED PROGRESS: Labs, imaging reviewed/interpreted.  Troponin normal.  BNP mildly elevated at 247.  Chest x-ray shows signs of congestive heart failure.  Intermittently having low sats on her chronic 2 L nasal cannula.  I feel she would benefit from IV diuresis.  Will discuss with medicine for admission.   6:39 AM Discussed patient's case with hospitalist, Dr. Toniann Fail.  I have recommended admission and patient (and family if present) agree with this plan. Admitting physician will place admission orders.   I reviewed all nursing notes, vitals, pertinent previous records and reviewed/interpreted all EKGs, lab and urine results, imaging (as available).     EKG Interpretation  Date/Time:  Monday May 18 2019 03:35:35 EDT Ventricular Rate:  69 PR Interval:    QRS Duration: 162 QT Interval:  466 QTC Calculation: 500 R Axis:   -85 Text Interpretation: Atrial-ventricular dual-paced rhythm No further analysis attempted due to paced rhythm No old tracing to compare Confirmed by Corneisha Alvi, Baxter Hire [phone]) on 05/18/2019 3:52:02 AM          Veronia Beets [name] was evaluated in Emergency Department on 05/18/2019 for the symptoms described in the history of present illness. She was evaluated in the context of the global COVID-19 pandemic, which necessitated consideration that the patient might be at risk for infection with the SARS-CoV-2 virus that causes COVID-19. Institutional protocols and algorithms that pertain to  the evaluation of patients at risk for COVID-19 are in a state of rapid change based on information released by regulatory bodies including the CDC and federal and state organizations. These policies and algorithms were followed during the patient's care in the ED.      Arianah Torgeson, Layla Maw, DO 05/18/19 [phone]

## 2019-05-18 NOTE — ED Triage Notes (Addendum)
Pt. Arrived [facility] c/o SOB and chest tightness on exertion. Per EMS, pt. Has swelling of b/l hands and feet. Pt. is on 2L oxygen at home.  Hx. CHF and COPD Pt. Denies fever and weakness

## 2019-05-18 NOTE — Plan of Care (Signed)
  Problem: Education: Goal: Knowledge of General Education information will improve Description: Including pain rating scale, medication(s)/side effects and non-pharmacologic comfort measures Outcome: Progressing   Problem: Education: Goal: Knowledge of General Education information will improve Description: Including pain rating scale, medication(s)/side effects and non-pharmacologic comfort measures Outcome: Progressing   Problem: Health Behavior/Discharge Planning: Goal: Ability to manage health-related needs will improve Outcome: Progressing   Problem: Clinical Measurements: Goal: Ability to maintain clinical measurements within normal limits will improve Outcome: Progressing Goal: Will remain free from infection Outcome: Progressing Goal: Diagnostic test results will improve Outcome: Progressing Goal: Respiratory complications will improve Outcome: Progressing Goal: Cardiovascular complication will be avoided Outcome: Progressing   Problem: Activity: Goal: Risk for activity intolerance will decrease Outcome: Progressing   Problem: Nutrition: Goal: Adequate nutrition will be maintained Outcome: Progressing   Problem: Coping: Goal: Level of anxiety will decrease Outcome: Progressing   Problem: Elimination: Goal: Will not experience complications related to bowel motility Outcome: Progressing Goal: Will not experience complications related to urinary retention Outcome: Progressing   Problem: Pain Managment: Goal: General experience of comfort will improve Outcome: Progressing   Problem: Safety: Goal: Ability to remain free from injury will improve Outcome: Progressing   Problem: Skin Integrity: Goal: Risk for impaired skin integrity will decrease Outcome: Progressing   Problem: Education: Goal: Ability to demonstrate management of disease process will improve Outcome: Progressing Goal: Ability to verbalize understanding of medication therapies will  improve Outcome: Progressing Goal: Individualized Educational Video(s) Outcome: Progressing   Problem: Activity: Goal: Capacity to carry out activities will improve Outcome: Progressing   Problem: Cardiac: Goal: Ability to achieve and maintain adequate cardiopulmonary perfusion will improve Outcome: Progressing   

## 2019-05-18 NOTE — H&P (Signed)
History and Physical    Felicia Benson ZOX:[id number] DOB: 11-28-1930 DOA: 05/18/2019  Referring MD/NP/PA: Midge Minium, MD PCP: System, Pcp Not In  Patient coming from:Daughter's home Via EMS  Chief Complaint: Hand and leg swelling, chest tightness, and shortness of breath  I have personally briefly reviewed patient's old medical records in [facility]   HPI: Felicia Benson is a 86 y.o. female with medical history significant of HTN, HLD, CHF, s/p PM, COPD, on supplemental on oxygen 2 L as needed, thoracic AAA, OSA, Crohn's disease, GI bleed, diverticulitis/diverticulosis, and GERD.  She presents with complaints of shortness of breath, chest tightness, and hand and leg swelling. Patient is originally from [place], but had been visiting her daughter here in [place] over the last month.  Patient is a retired Engineer, civil (consulting) who lives alone in [place], and is able to complete all of her ADLs without assistance.  Her blood pressures had been recently elevated up into the 170s causing her to have headaches.  She had been started on hydralazine by her cardiologist and recently increased to 50 mg 3 times daily 3 days ago.  Normally patient has swelling in her lower extremities that she related to amlodipine.  Symptoms usually would go away in the mornings, but yesterday the swelling persisted.  She complains of rings feeling tight on her fingers.  Her oxygenation at home when she checked was noted to be dipping down as low as 82 to 84%.  Associated symptoms included generalized malaise, poor appetite, chest tightness, and wheezing.  Denies having any prior history of congestive heart failure exacerbation, fever, chills, dark stools, nausea, vomiting, abdominal pain, or diarrhea symptoms.  She is followed by Dr. Emilie Rutter of cardiology in [place].  Patient had not been recently checking her weight and is not not been on diuretics in over a year.  ED Course: Upon admission into the emergency  department patient was seen to be with O2 saturations maintained on 2 L of nasal cannula oxygen, and all other vital signs maintained.  Labs significant for hemoglobin 11.2, sodium 134, BUN 16, creatinine 1.1, BNP 247.7, and high-sensitivity troponin.  Chest x-ray significant for cardiomegaly with mild pulmonary vascular congestion and small bilateral effusions.  Pacemaker was interrogated.  Influenza and COVID-19 screening were pending.  Patient was given full dose aspirin and 40 mg of Lasix IV.  TRH called to admit  Review of Systems  Constitutional: Positive for malaise/fatigue. Negative for chills and fever.  HENT: Negative for congestion and sinus pain.   Eyes: Negative for double vision and photophobia.  Respiratory: Positive for shortness of breath and wheezing. Negative for cough.   Cardiovascular: Positive for leg swelling. Negative for palpitations and PND.  Gastrointestinal: Negative for abdominal pain, blood in stool, diarrhea, nausea and vomiting.  Genitourinary: Negative for dysuria and hematuria.  Musculoskeletal: Negative for joint pain and myalgias.  Skin: Negative for itching and rash.  Neurological: Positive for weakness and headaches. Negative for focal weakness and loss of consciousness.  Endo/Heme/Allergies: Negative for polydipsia.  Psychiatric/Behavioral: Negative for memory loss and substance abuse.    Past Medical History:  Diagnosis Date  . AAA (abdominal aortic aneurysm) (HCC)   . CHF (congestive heart failure) (HCC)   . Crohn's disease (HCC)   . Diverticulitis   . Diverticulosis   . GERD (gastroesophageal reflux disease)   . GI bleed   . Hypertension   . OSA (obstructive sleep apnea)   . Pacemaker   . Thyroid nodule  Past Surgical History:  Procedure Laterality Date  . CHOLECYSTECTOMY    . COLONOSCOPY WITH PROPOFOL N/A 06/01/2015   Procedure: COLONOSCOPY WITH PROPOFOL;  Surgeon: Charlott Rakes, MD;  Location: [facility] ENDOSCOPY;  Service: Endoscopy;   Laterality: N/A;  . ESOPHAGOGASTRODUODENOSCOPY (EGD) WITH PROPOFOL N/A 06/01/2015   Procedure: ESOPHAGOGASTRODUODENOSCOPY (EGD) WITH PROPOFOL;  Surgeon: Charlott Rakes, MD;  Location: [facility] ENDOSCOPY;  Service: Endoscopy;  Laterality: N/A;  . GIVENS CAPSULE STUDY N/A 06/03/2015   Procedure: GIVENS CAPSULE STUDY;  Surgeon: Charlott Rakes, MD;  Location: [facility] ENDOSCOPY;  Service: Endoscopy;  Laterality: N/A;  . PACEMAKER PLACEMENT  2015     reports that she has quit smoking. She has never used smokeless tobacco. She reports that she does not drink alcohol or use drugs.  No Known Allergies  Family History  Problem Relation Age of Onset  . CVA Mother   . Lung cancer Father   . Colon cancer Neg Hx     Prior to Admission medications   Medication Sig Start Date End Date Taking? Authorizing Provider  acetaminophen (TYLENOL) 325 MG tablet Take 650 mg by mouth 2 (two) times daily as needed for mild pain or headache.   Yes [provider]  amLODipine (NORVASC) 10 MG tablet Take 1 tablet (10 mg total) by mouth every evening. Hold for next few days 06/04/15  Yes Rai, Ripudeep K, MD  aspirin 81 MG EC tablet Take 1 tablet by mouth daily. 10/18/15  Yes [provider]  atorvastatin (LIPITOR) 20 MG tablet Take 1 tablet by mouth daily. 03/07/18  Yes [provider]  Cholecalciferol (VITAMIN D3) 5000 units TABS Take 5,000 Units by mouth daily.    Yes [provider]  esomeprazole (NEXIUM) 40 MG capsule Take 40 mg by mouth daily. 03/21/19  Yes [provider]  hydrALAZINE (APRESOLINE) 25 MG tablet Take 50 mg by mouth every 8 (eight) hours. 04/29/19  Yes [provider]  hydroxypropyl methylcellulose / hypromellose (ISOPTO TEARS / GONIOVISC) 2.5 % ophthalmic solution Place 1 drop into both eyes 2 (two) times daily as needed for dry eyes.   Yes [provider]  losartan (COZAAR) 100 MG tablet Take 100 mg by mouth daily. 02/09/19  Yes [provider]  mesalamine (PENTASA) 500 MG CR capsule Take 1,500 mg by mouth 2 (two) times daily.   Yes [provider]  metoprolol succinate (TOPROL-XL) 25 MG 24 hr tablet Take 50 mg by mouth 2 (two) times daily. 04/23/15  Yes [provider]  metoprolol succinate (TOPROL-XL) 50 MG 24 hr tablet Take 1 tablet by mouth in the morning and at bedtime. 04/16/17  Yes [provider]  montelukast (SINGULAIR) 10 MG tablet Take 10 mg by mouth daily. 04/05/15  Yes [provider]  multivitamin-lutein (OCUVITE-LUTEIN) CAPS capsule Take 1 capsule by mouth daily.   Yes [provider]  nitroGLYCERIN (NITROSTAT) 0.3 MG SL tablet Place 1 tablet under the tongue every 5 (five) hours as needed for chest pain.   Yes [provider]  SYMBICORT 160-4.5 MCG/ACT inhaler Inhale 2 puffs into the lungs in the morning and at bedtime. 05/15/19  Yes [provider]  losartan-hydrochlorothiazide (HYZAAR) 50-12.5 MG tablet Take 1 tablet by mouth daily. HOLD for next few days Patient not taking: Reported on 05/18/2019 06/04/15   Rai, Delene Ruffini, MD  pantoprazole (PROTONIX) 40 MG tablet Take 1 tablet (40 mg total) by mouth 2 (two) times daily before a meal. Patient not taking: Reported on 05/18/2019  06/04/15   Rai, Delene Ruffini, MD  traMADol (ULTRAM) 50 MG tablet Take 1 tablet (50 mg total) by mouth every 8 (eight) hours as needed for moderate pain or severe pain. Patient not taking: Reported on 05/18/2019 06/04/15   Cathren Harsh, MD    Physical Exam:  Constitutional: Elderly female currently in no acute distress Vitals:   05/18/19 0600 05/18/19 0630 05/18/19 0700 05/18/19 0734  BP: 123/68 133/63 122/68   Pulse: 61 (!) 57 63   Resp: 15 19 17    Temp:      TempSrc:      SpO2: 94% 96% 95% 93%  Weight:      Height:       Eyes: PERRL, lids and conjunctivae normal ENMT: Mucous membranes are moist. Posterior pharynx clear of any exudate or lesions.  Neck: normal, supple, no masses,  no thyromegaly.  Mild JVD appreciated. Respiratory: Decreased aeration with positive crackles noted in the mid to lower lung fields.  No significant wheezing or rhonchi appreciated.  Patient currently on 2 L of nasal cannula oxygen. Cardiovascular: Regular rate and rhythm, no murmurs / rubs / gallops.  1+ bilateral lower extremity edema. 2+ pedal pulses. No carotid bruits.  Abdomen: no tenderness, no masses palpated. No hepatosplenomegaly. Bowel sounds positive.  Musculoskeletal: no clubbing / cyanosis. No joint deformity upper and lower extremities. Good ROM, no contractures. Normal muscle tone.  Skin: no rashes, lesions, ulcers. No induration Neurologic: CN 2-12 grossly intact. Sensation intact, DTR normal. Strength 5/5 in all 4.  Psychiatric: Normal judgment and insight. Alert and oriented x 3. Normal mood.     Labs on Admission: I have personally reviewed following labs and imaging studies  CBC: Recent Labs  Lab 05/18/19 0423  WBC 6.6  NEUTROABS 4.4  HGB 11.2*  HCT 36.6  MCV 90.8  PLT 266   Basic Metabolic Panel: Recent Labs  Lab 05/18/19 0423  NA 134*  K 4.9  CL 100  CO2 24  GLUCOSE 97  BUN 16  CREATININE 1.10*  CALCIUM 8.1*   GFR: Estimated Creatinine Clearance: 40.9 mL/Felicia (A) (by C-G formula based on SCr of 1.1 mg/dL (H)). Liver Function Tests: No results for input(s): AST, ALT, ALKPHOS, BILITOT, PROT, ALBUMIN in the last 168 hours. No results for input(s): LIPASE, AMYLASE in the last 168 hours. No results for input(s): AMMONIA in the last 168 hours. Coagulation Profile: No results for input(s): INR, PROTIME in the last 168 hours. Cardiac Enzymes: No results for input(s): CKTOTAL, CKMB, CKMBINDEX, TROPONINI in the last 168 hours. BNP (last 3 results) No results for input(s): PROBNP in the last 8760 hours. HbA1C: No results for input(s): HGBA1C in the last 72 hours. CBG: No results for input(s): GLUCAP in the last 168 hours. Lipid Profile: No results for  input(s): CHOL, HDL, LDLCALC, TRIG, CHOLHDL, LDLDIRECT in the last 72 hours. Thyroid Function Tests: No results for input(s): TSH, T4TOTAL, FREET4, T3FREE, THYROIDAB in the last 72 hours. Anemia Panel: No results for input(s): VITAMINB12, FOLATE, FERRITIN, TIBC, IRON, RETICCTPCT in the last 72 hours. Urine analysis: No results found for: COLORURINE, APPEARANCEUR, LABSPEC, PHURINE, GLUCOSEU, HGBUR, BILIRUBINUR, KETONESUR, PROTEINUR, UROBILINOGEN, NITRITE, LEUKOCYTESUR Sepsis Labs: Recent Results (from the past 240 hour(s))  Respiratory Panel by RT PCR (Flu A&B, Covid) - Nasopharyngeal Swab     Status: None   Collection Time: 05/18/19  3:52 AM   Specimen: Nasopharyngeal Swab  Result Value Ref Range Status   SARS Coronavirus 2 by RT PCR NEGATIVE NEGATIVE  Final    Comment: (NOTE) SARS-CoV-2 target nucleic acids are NOT DETECTED. The SARS-CoV-2 RNA is generally detectable in upper respiratoy specimens during the acute phase of infection. The lowest concentration of SARS-CoV-2 viral copies this assay can detect is 131 copies/mL. A negative result does not preclude SARS-Cov-2 infection and should not be used as the sole basis for treatment or other patient management decisions. A negative result may occur with  improper specimen collection/handling, submission of specimen other than nasopharyngeal swab, presence of viral mutation(s) within the areas targeted by this assay, and inadequate number of viral copies (<131 copies/mL). A negative result must be combined with clinical observations, patient history, and epidemiological information. The expected result is Negative. Fact Sheet for Patients:  https://www.moore.com/ Fact Sheet for Healthcare Providers:  https://www.young.biz/ This test is not yet ap proved or cleared by the [place] FDA and  has been authorized for detection and/or diagnosis of SARS-CoV-2 by FDA under an Emergency Use  Authorization (EUA). This EUA will remain  in effect (meaning this test can be used) for the duration of the COVID-19 declaration under Section 564(b)(1) of the Act, 21 U.S.C. section 360bbb-3(b)(1), unless the authorization is terminated or revoked sooner.    Influenza A by PCR NEGATIVE NEGATIVE Final   Influenza B by PCR NEGATIVE NEGATIVE Final    Comment: (NOTE) The Xpert Xpress SARS-CoV-2/FLU/RSV assay is intended as an aid in  the diagnosis of influenza from Nasopharyngeal swab specimens and  should not be used as a sole basis for treatment. Nasal washings and  aspirates are unacceptable for Xpert Xpress SARS-CoV-2/FLU/RSV  testing. Fact Sheet for Patients: https://www.moore.com/ Fact Sheet for Healthcare Providers: https://www.young.biz/ This test is not yet approved or cleared by the [place] FDA and  has been authorized for detection and/or diagnosis of SARS-CoV-2 by  FDA under an Emergency Use Authorization (EUA). This EUA will remain  in effect (meaning this test can be used) for the duration of the  Covid-19 declaration under Section 564(b)(1) of the Act, 21  U.S.C. section 360bbb-3(b)(1), unless the authorization is  terminated or revoked. Performed at [facility], [address], [place], [place] [address]      Radiological Exams on Admission: DG Chest Portable 1 View  Result Date: 05/18/2019 CLINICAL DATA:  Chest pain and shortness of breath. EXAM: PORTABLE CHEST 1 VIEW COMPARISON:  None. FINDINGS: Heart is enlarged. Mild pulmonary vascular congestion is present without frank edema. Atherosclerotic changes are noted at the arch. Small effusions are present. Bibasilar airspace opacities are noted. The upper lung fields are clear. Pacing wires are in satisfactory position. Surgical clips are present along the left neck. IMPRESSION: 1. Cardiomegaly with mild pulmonary vascular congestion and small bilateral pleural  effusions compatible with congestive heart failure. 2. Bibasilar airspace disease likely reflects atelectasis. Infection is not excluded. 3. Atherosclerosis of the thoracic aorta. Electronically Signed   By: Marin Roberts M.D.   On: 05/18/2019 04:41    EKG: Independently reviewed.  Atrial ventricular dual paced rhythm at 69 bpm  Assessment/Plan Congestive heart failure: Acute.  Patient presents with complaints of shortness of breath and chest tightness on exertion.  On physical exam patient with swelling of extremities.  BNP elevated at 247.7.  Chest x-ray showing cardiomegaly with mild pulmonary vascular congestion and small bilateral effusions.  Patient was given 40 mg of Lasix IV. -Admit to a telemetry bed -Heart failure orders set  initiated  -Strict I&Os and daily weights -Elevate lower extremities -Check  TSH -Lasix 40 mg IV Bid -Reassess in a.m. and adjust diuresis as needed. -Check echocardiogram -Dr. Sula Soda of cardiology [phone] notified of the patient being admitted into the hospital.  Chest tightness: Acute.  Patient reported complaints of chest tightness on arrival.  Initial high-sensitivity troponin negative and EKG showing a paced rhythm.  Patient was given full dose aspirin.  Suspect secondary to above.  -Continue to monitor  Acute on chronic respiratory failure, COPD(without exacerbation): At home patient reported O2 saturations into the 80s.  Normally has supplemental oxygen at 2 L as needed. -Continuous pulse oximetry with nasal cannula oxygen as needed to keep O2 saturations >92% -Albuterol nebs as needed -Pharmacy substitution for Symbicort  Essential hypertension: Noted to be stable.  Home blood pressure medications include hydralazine 50 mg 3 times daily, amlodipine 10 mg nightly, metoprolol 50 mg twice daily, and losartan 100 mg daily. -Continue metoprolol and losartan  -Held hydralazine and amlodipine initially while initiating diuresing due to low normal  blood pressures.  Normocytic anemia: Hemoglobin 11.2 on admission with elevated RDW to suggest iron deficiency anemia.  Patient denied any reports of bleeding -Check stool guaiac -Recheck CBC in a.m.  Hyponatremia: Sodium mildly low at 134.  Suspect a hypervolemic hyponatremia in the setting of CHF. -Recheck sodium levels in a.m.  Crohn's disease: Home medications include mesalamine 1500 mg twice daily. -Continue mesalamine  Hyperlipidemia: Home medications include atorvastatin 20 mg daily. -Continue statin  S/p pacemaker: Pacemaker initially placed in 2015.  Interrogated while in the ED.  AAA: Last noted to be approximately 4 cm in 03/2019. -Continue outpatient follow-up  GERD: Home medications include Nexium 40 mg daily -Continue pharmacy substitution for Nexium  DVT prophylaxis: Lovenox Code Status: Full Family Communication: Daughter updated over the phone Disposition Plan: Possible discharge home in 1 to 2 days Consults called: Case discussed with Dr. Dr. Admission status: Observation  Norval Morton MD [facility] Pager [phone]   If 7PM-7AM, please contact night-coverage [email or web address] Password [email or web address]  05/18/2019, 9:22 AM

## 2019-05-18 NOTE — ED Notes (Signed)
Pt. Pacemaker successfully interrogated.

## 2019-05-18 NOTE — Evaluation (Signed)
Physical Therapy Evaluation Patient Details Name: Felicia Benson MRN: [id number] DOB: January 02, 1931 Today's Date: 05/18/2019   History of Present Illness  Pt is an 86 y/o female admitted secondary to worsening SOB and chest pain thought to be secondary to CHF. PMH includes HTN, COPD on oxygen PRN, AAA, s/p pacemaker, and CHF.   Clinical Impression  Pt admitted secondary to problem above with deficits below. Pt requiring min guard A for gait within the room. Oxygen sats decreasing to 89% on 3L after gait and required seated rest to return to 90% on 3L. Anticipate pt will progress well with mobility and will not require follow up PT. Will continue to follow acutely to maximize functional mobility independence and safety.     Follow Up Recommendations No PT follow up    Equipment Recommendations  None recommended by PT    Recommendations for Other Services       Precautions / Restrictions Precautions Precautions: Fall;Other (comment) Precaution Comments: Watch O2 Restrictions Weight Bearing Restrictions: No      Mobility  Bed Mobility Overal bed mobility: Modified Independent                Transfers Overall transfer level: Needs assistance Equipment used: None Transfers: Sit to/from Stand Sit to Stand: Min guard         General transfer comment: Min guard for safety. No LOB noted.   Ambulation/Gait Ambulation/Gait assistance: Min guard Gait Distance (Feet): 25 Feet Assistive device: None Gait Pattern/deviations: Step-through pattern;Decreased stride length Gait velocity: Decreased   General Gait Details: Overall steady gait within the room. Oxygen at 89% on 3L following gait and required seated rest to return to 90% on 3L. Mild SOB noted.   Stairs            Wheelchair Mobility    Modified Rankin (Stroke Patients Only)       Balance Overall balance assessment: Needs assistance Sitting-balance support: No upper extremity supported Sitting  balance-Leahy Scale: Good     Standing balance support: No upper extremity supported Standing balance-Leahy Scale: Fair                               Pertinent Vitals/Pain Pain Assessment: No/denies pain    Home Living Family/patient expects to be discharged to:: Private residence Living Arrangements: Alone Available Help at Discharge: Family;Available PRN/intermittently Type of Home: House Home Access: Stairs to enter Entrance Stairs-Rails: None Entrance Stairs-Number of Steps: 2 Home Layout: One level Home Equipment: Emergency planning/management officer - 2 wheels      Prior Function Level of Independence: Independent               Hand Dominance        Extremity/Trunk Assessment   Upper Extremity Assessment Upper Extremity Assessment: Overall WFL for tasks assessed    Lower Extremity Assessment Lower Extremity Assessment: Generalized weakness       Communication   Communication: No difficulties  Cognition Arousal/Alertness: Awake/alert Behavior During Therapy: WFL for tasks assessed/performed Overall Cognitive Status: Within Functional Limits for tasks assessed                                        General Comments      Exercises     Assessment/Plan    PT Assessment Patient needs continued PT services  PT Problem List  Cardiopulmonary status limiting activity;Decreased mobility;Decreased activity tolerance       PT Treatment Interventions Gait training;Stair training;Functional mobility training;Therapeutic activities;Therapeutic exercise;Balance training;Patient/family education    PT Goals (Current goals can be found in the Care Plan section)  Acute Rehab PT Goals Patient Stated Goal: to feel better PT Goal Formulation: With patient Time For Goal Achievement: 06/01/19 Potential to Achieve Goals: Good    Frequency Min 3X/week   Barriers to discharge        Co-evaluation               AM-PAC PT "6 Clicks"  Mobility  Outcome Measure Help needed turning from your back to your side while in a flat bed without using bedrails?: None Help needed moving from lying on your back to sitting on the side of a flat bed without using bedrails?: None Help needed moving to and from a bed to a chair (including a wheelchair)?: A Little Help needed standing up from a chair using your arms (e.g., wheelchair or bedside chair)?: A Little Help needed to walk in hospital room?: A Little Help needed climbing 3-5 steps with a railing? : A Little 6 Click Score: 20    End of Session Equipment Utilized During Treatment: Gait belt;Oxygen Activity Tolerance: Patient tolerated treatment well Patient left: in chair;with call bell/phone within reach Nurse Communication: Mobility status PT Visit Diagnosis: Other abnormalities of gait and mobility (R26.89)    Time: 2119-4174 PT Time Calculation (min) (ACUTE ONLY): 19 min   Charges:   PT Evaluation $PT Eval Low Complexity: 1 Low          Lou Miner, DPT  Acute Rehabilitation Services  Pager: [phone] Office: [phone]   Rudean Hitt 05/18/2019, 4:10 PM

## 2019-05-18 NOTE — ED Notes (Signed)
Breakfast tray ordered 

## 2019-05-19 ENCOUNTER — Observation Stay (HOSPITAL_COMMUNITY): Payer: Medicare Other

## 2019-05-19 DIAGNOSIS — K5090 Crohn's disease, unspecified, without complications: Secondary | ICD-10-CM | POA: Diagnosis present

## 2019-05-19 DIAGNOSIS — R0602 Shortness of breath: Secondary | ICD-10-CM | POA: Diagnosis present

## 2019-05-19 DIAGNOSIS — Z6833 Body mass index (BMI) 33.0-33.9, adult: Secondary | ICD-10-CM | POA: Diagnosis not present

## 2019-05-19 DIAGNOSIS — J449 Chronic obstructive pulmonary disease, unspecified: Secondary | ICD-10-CM | POA: Diagnosis present

## 2019-05-19 DIAGNOSIS — I110 Hypertensive heart disease with heart failure: Secondary | ICD-10-CM | POA: Diagnosis present

## 2019-05-19 DIAGNOSIS — E871 Hypo-osmolality and hyponatremia: Secondary | ICD-10-CM | POA: Diagnosis present

## 2019-05-19 DIAGNOSIS — G4733 Obstructive sleep apnea (adult) (pediatric): Secondary | ICD-10-CM | POA: Diagnosis present

## 2019-05-19 DIAGNOSIS — R188 Other ascites: Secondary | ICD-10-CM | POA: Diagnosis present

## 2019-05-19 DIAGNOSIS — Z20822 Contact with and (suspected) exposure to covid-19: Secondary | ICD-10-CM | POA: Diagnosis present

## 2019-05-19 DIAGNOSIS — I509 Heart failure, unspecified: Secondary | ICD-10-CM | POA: Diagnosis not present

## 2019-05-19 DIAGNOSIS — I5033 Acute on chronic diastolic (congestive) heart failure: Secondary | ICD-10-CM | POA: Diagnosis present

## 2019-05-19 DIAGNOSIS — J9621 Acute and chronic respiratory failure with hypoxia: Secondary | ICD-10-CM | POA: Diagnosis present

## 2019-05-19 DIAGNOSIS — K219 Gastro-esophageal reflux disease without esophagitis: Secondary | ICD-10-CM | POA: Diagnosis present

## 2019-05-19 DIAGNOSIS — K50819 Crohn's disease of both small and large intestine with unspecified complications: Secondary | ICD-10-CM | POA: Diagnosis not present

## 2019-05-19 DIAGNOSIS — D649 Anemia, unspecified: Secondary | ICD-10-CM | POA: Diagnosis present

## 2019-05-19 DIAGNOSIS — I5032 Chronic diastolic (congestive) heart failure: Secondary | ICD-10-CM | POA: Diagnosis present

## 2019-05-19 DIAGNOSIS — Z950 Presence of cardiac pacemaker: Secondary | ICD-10-CM | POA: Diagnosis not present

## 2019-05-19 DIAGNOSIS — Z7982 Long term (current) use of aspirin: Secondary | ICD-10-CM | POA: Diagnosis not present

## 2019-05-19 DIAGNOSIS — Z79899 Other long term (current) drug therapy: Secondary | ICD-10-CM | POA: Diagnosis not present

## 2019-05-19 DIAGNOSIS — E669 Obesity, unspecified: Secondary | ICD-10-CM | POA: Diagnosis present

## 2019-05-19 DIAGNOSIS — E785 Hyperlipidemia, unspecified: Secondary | ICD-10-CM | POA: Diagnosis present

## 2019-05-19 DIAGNOSIS — Z7951 Long term (current) use of inhaled steroids: Secondary | ICD-10-CM | POA: Diagnosis not present

## 2019-05-19 DIAGNOSIS — Z87891 Personal history of nicotine dependence: Secondary | ICD-10-CM | POA: Diagnosis not present

## 2019-05-19 LAB — BASIC METABOLIC PANEL
Anion gap: 7 (ref 5–15)
BUN: 14 mg/dL (ref 8–23)
CO2: 32 mmol/L (ref 22–32)
Calcium: 7.9 mg/dL — ABNORMAL LOW (ref 8.9–10.3)
Chloride: 97 mmol/L — ABNORMAL LOW (ref 98–111)
Creatinine, Ser: 1.26 mg/dL — ABNORMAL HIGH (ref 0.44–1.00)
GFR calc Af Amer: 44 mL/min — ABNORMAL LOW (ref >60)
GFR calc non Af Amer: 38 mL/min — ABNORMAL LOW (ref >60)
Glucose, Bld: 91 mg/dL (ref 70–99)
Potassium: 3.4 mmol/L — ABNORMAL LOW (ref 3.5–5.1)
Sodium: 136 mmol/L (ref 135–145)

## 2019-05-19 LAB — CBC
HCT: 33.6 % — ABNORMAL LOW (ref 36.0–46.0)
Hemoglobin: 10.6 g/dL — ABNORMAL LOW (ref 12.0–15.0)
MCH: 28.3 pg (ref 26.0–34.0)
MCHC: 31.5 g/dL (ref 30.0–36.0)
MCV: 89.6 fL (ref 80.0–100.0)
Platelets: 249 10*3/uL (ref 150–400)
RBC: 3.75 MIL/uL — ABNORMAL LOW (ref 3.87–5.11)
RDW: 16.5 % — ABNORMAL HIGH (ref 11.5–15.5)
WBC: 6.8 10*3/uL (ref 4.0–10.5)
nRBC: 0 % (ref 0.0–0.2)

## 2019-05-19 IMAGING — DX DG CHEST 2V
2 series · 2 of 2 positions shown · non-contrast
Comparison: [DATE]

CLINICAL DATA: Shortness of breath beginning 3 days ago.

EXAM:
CHEST - 2 VIEW

[chest lat]
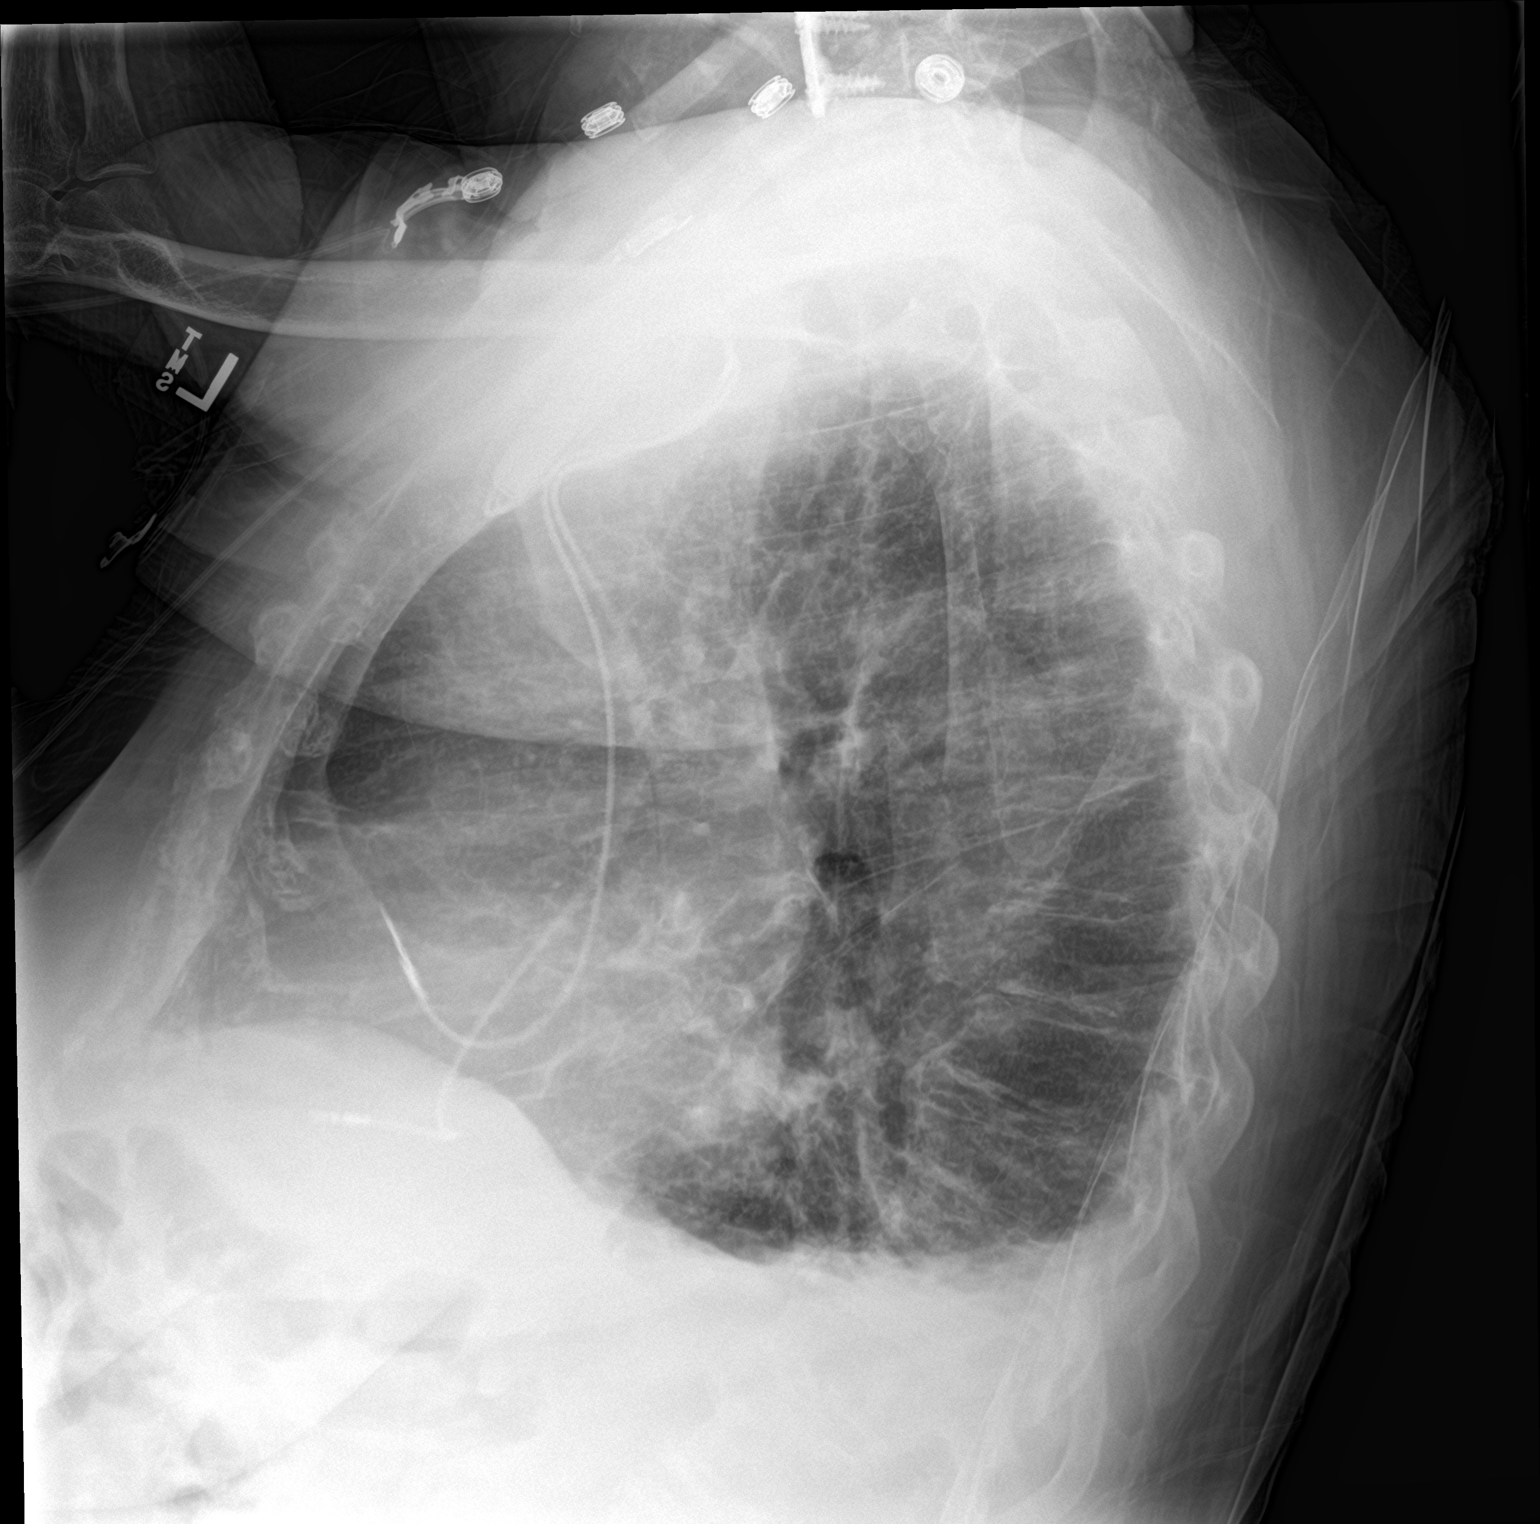

[chest ap]
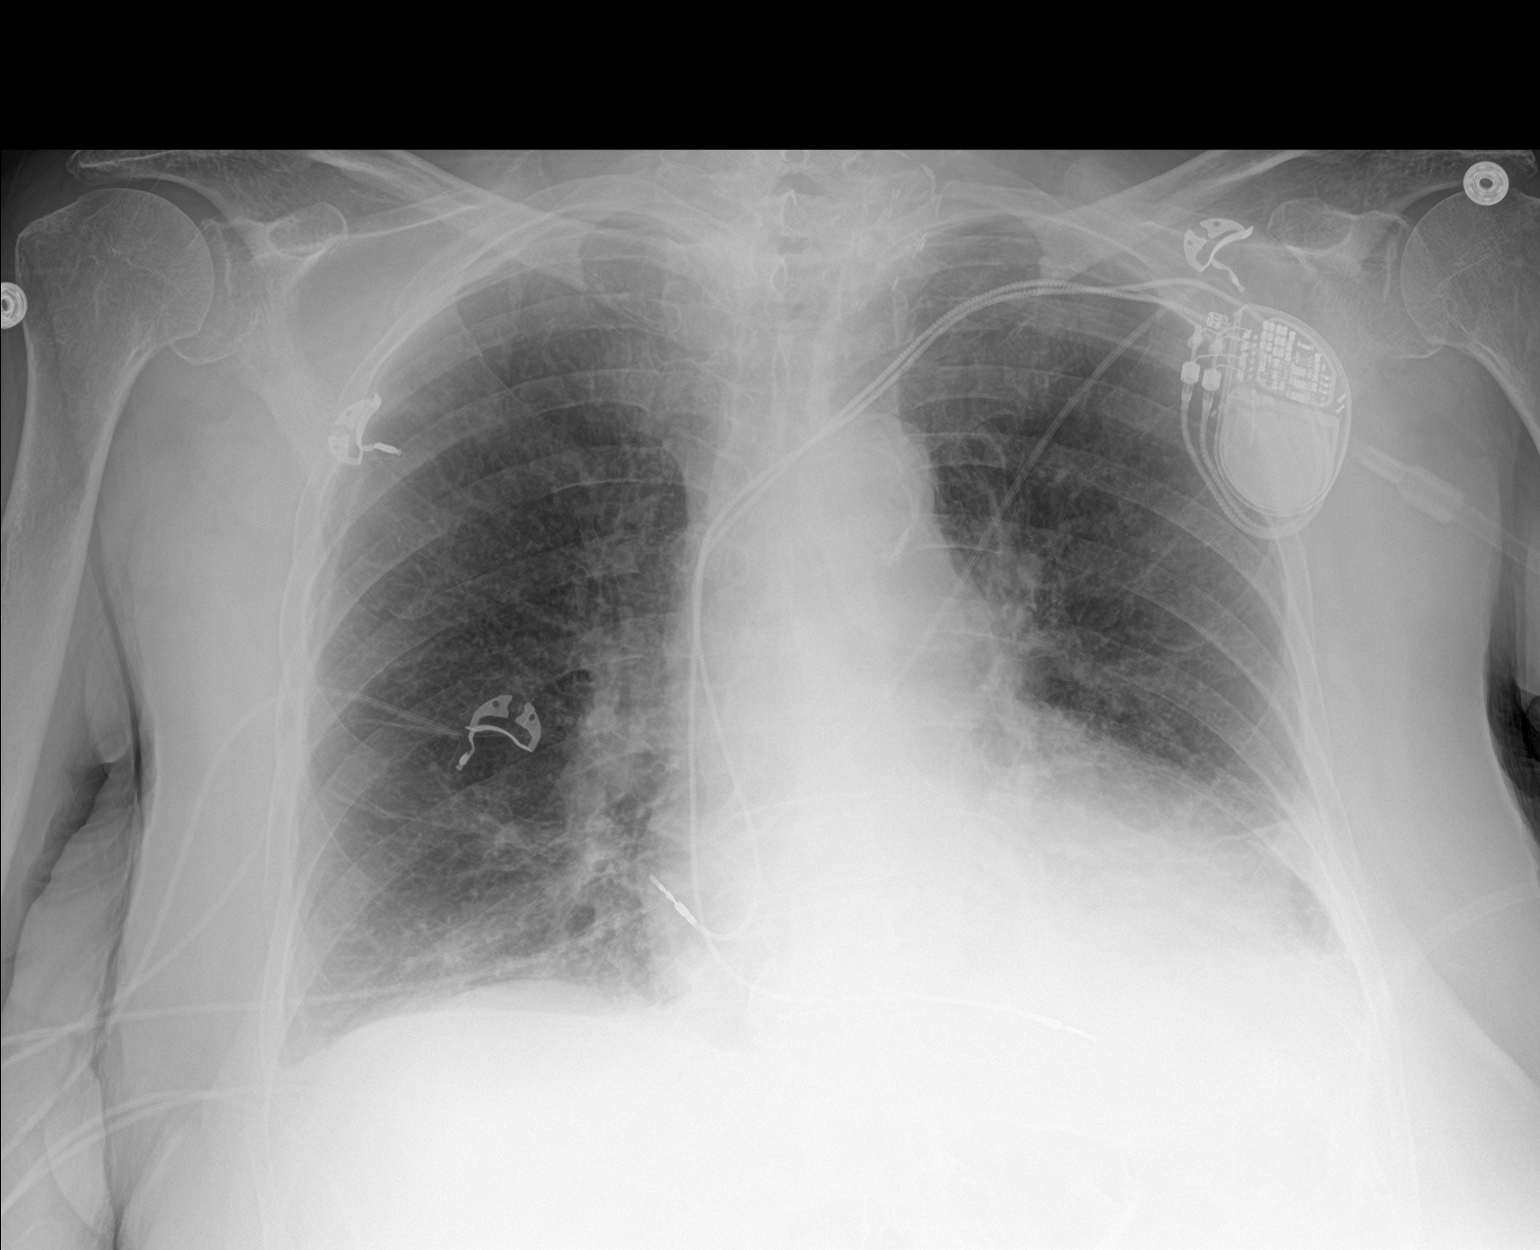

[2 of 2 positions shown; findings below may reference images not displayed]

FINDINGS: Cardiomegaly. Aortic atherosclerosis. Dual lead pacemaker appearing
unchanged. Enlarging left effusion with worsening volume loss at the
left base. Pulmonary venous hypertension, possibly with early
interstitial edema. No acute bone finding.
IMPRESSION: Probable worsening congestive heart failure. Cardiomegaly. Aortic
atherosclerosis. Enlarging left effusion with left base volume loss.
Early interstitial edema.

## 2019-05-19 MED ORDER — FUROSEMIDE 40 MG PO TABS
40.0000 mg | ORAL_TABLET | Freq: Every day | ORAL | Status: DC
  Administered 2019-05-19: 40 mg via ORAL
  Filled 2019-05-19: qty 1

## 2019-05-19 MED ORDER — POTASSIUM CHLORIDE CRYS ER 20 MEQ PO TBCR
40.0000 meq | EXTENDED_RELEASE_TABLET | Freq: Once | ORAL | Status: AC
  Administered 2019-05-19: 40 meq via ORAL
  Filled 2019-05-19: qty 2

## 2019-05-19 MED ORDER — FUROSEMIDE 10 MG/ML IJ SOLN
40.0000 mg | Freq: Two times a day (BID) | INTRAMUSCULAR | Status: DC
  Administered 2019-05-19 – 2019-05-20 (×2): 40 mg via INTRAVENOUS
  Filled 2019-05-19 (×2): qty 4

## 2019-05-19 MED ORDER — FUROSEMIDE 10 MG/ML IJ SOLN: 20.0000 mg | Freq: Once | INTRAMUSCULAR | Status: DC

## 2019-05-19 NOTE — Progress Notes (Signed)
[facility] Progress Note  Patient: Felicia Benson    YIR:[id number]  DOA: 05/18/2019     Date of Service: the patient was seen and examined on 05/19/2019  Chief Complaint  Patient presents with  . Shortness of Breath  . Chest Pain   Brief hospital course: Past medical history of HTN, HLD, CHF, COPD, thoracic aortic aneurysm, OSA, Crohn's disease, GI bleed, chronic hypoxic respiratory failure on as needed 2 LPM at home.  Presents with complaints of shortness of breath and swelling found to have acute on chronic diastolic CHF. Currently further plan is continue IV diuresis.  Assessment and Plan: 1.  Acute on chronic diastolic CHF Originally from [place] and follows up with cardiology there. Recently having issues with her blood pressure and cardiologist is adjusting her blood pressure medication. Presents with shortness of breath and hypoxia. Uses 2 L of oxygen as needed at home currently requiring 3 LPM consistently. Continue IV Lasix. Daily weight. Monitor urine output. Repeat chest x-ray on 05/19/2019 shows persistent congestive heart failure-like signs with worsening left pleural effusion Echocardiogram shows preserved EF without any wall motion abnormality. Patient would like to notify her cardiologist although my attempt has not been successful.  Left voicemail.  2.  Chest tightness. Serial troponins negative. EKG nonischemic. Monitor.  Echocardiogram shows no wall motion abnormality.  3.  Acute on chronic hypoxic respiratory failure. History of COPD. Currently COPD appears to be stable.  Continue home inhalers.  Monitor.  4.  Essential hypertension Blood pressure stable. Patient had hard time controlling her blood pressure outpatient. Currently on metoprolol and losartan. Hydralazine and amlodipine currently on hold.  5.  Normocytic anemia H&H relatively stable. No active bleeding. Monitor.  6.  Sick sinus syndrome.  As per pacemaker Currently  functioning well. No acute events.  7.  Hyperlipidemia Continue statin  8.  Crohn's disease No active issues. Continue . 9.  GERD Continue PPI  10 obesity Body mass index is 33.51 kg/m.   Diet: Cardiac diet DVT Prophylaxis: Subcutaneous Lovenox   Advance goals of care discussion: Full code  Family Communication: no family was present at bedside, at the time of interview.   Disposition:  Status is: Inpatient  Remains inpatient appropriate because:IV treatments appropriate due to intensity of illness or inability to take PO  Repeat chest x-ray still shows worsening of pleural effusion.  Patient consistently hypoxic  Dispo: The patient is from: Home              Anticipated d/c is to: Home              Anticipated d/c date is: 2 days              Patient currently is not medically stable to d/c.   Subjective: Continues to have shortness of breath.  No cough.  No chest pain.  No nausea no vomiting.  Physical Exam: General:  alert oriented to time, place, and person.  Appear in mild distress, affect appropriate Eyes: PERRL ENT: Oral Mucosa Clear, moist  Neck: Positive JVD,  Cardiovascular: S1 and S2 Present, no Murmur,  Respiratory: increased respiratory effort, Bilateral Air entry equal and Decreased, bilateral  Crackles, no wheezes Abdomen: Bowel Sound present, Soft and no tenderness,  Skin: no rash Extremities: no Pedal edema, no calf tenderness Neurologic: without any new focal findings Gait not checked due to patient safety concerns  Vitals:   05/19/19 0742 05/19/19 0902 05/19/19 1624 05/19/19 2010  BP: 126/74  128/72 117/66  Pulse: 60  (!) 59 63  Resp: 15  16 18   Temp: (!) 97.5 F (36.4 C)  98.1 F (36.7 C) 98 F (36.7 C)  TempSrc: Oral  Oral Oral  SpO2: 93% 95% 91% 93%  Weight:      Height:        Intake/Output Summary (Last 24 hours) at 05/19/2019 2057 Last data filed at 05/19/2019 1904 Gross per 24 hour  Intake 723 ml  Output 2750 ml  Net  -2027 ml   Filed Weights   05/18/19 0341 05/19/19 0334  Weight: 90.7 kg 94.2 kg    Data Reviewed: I have personally reviewed and interpreted daily labs, tele strips, imagings as discussed above. I reviewed all nursing notes, pharmacy notes, vitals, pertinent old records I have discussed plan of care as described above with RN and patient/family.  CBC: Recent Labs  Lab 05/18/19 0423 05/19/19 0536  WBC 6.6 6.8  NEUTROABS 4.4  --   HGB 11.2* 10.6*  HCT 36.6 33.6*  MCV 90.8 89.6  PLT 266 585   Basic Metabolic Panel: Recent Labs  Lab 05/18/19 0423 05/19/19 0536  NA 134* 136  K 4.9 3.4*  CL 100 97*  CO2 24 32  GLUCOSE 97 91  BUN 16 14  CREATININE 1.10* 1.26*  CALCIUM 8.1* 7.9*    Studies: DG Chest 2 View  Result Date: 05/19/2019 CLINICAL DATA:  Shortness of breath beginning 3 days ago. EXAM: CHEST - 2 VIEW COMPARISON:  05/18/2019 FINDINGS: Cardiomegaly. Aortic atherosclerosis. Dual lead pacemaker appearing unchanged. Enlarging left effusion with worsening volume loss at the left base. Pulmonary venous hypertension, possibly with early interstitial edema. No acute bone finding. IMPRESSION: Probable worsening congestive heart failure. Cardiomegaly. Aortic atherosclerosis. Enlarging left effusion with left base volume loss. Early interstitial edema. Electronically Signed   By: Nelson Chimes M.D.   On: 05/19/2019 13:01    Scheduled Meds: . aspirin EC  81 mg Oral Daily  . atorvastatin  20 mg Oral Daily  . enoxaparin (LOVENOX) injection  40 mg Subcutaneous Q24H  . furosemide  40 mg Intravenous BID  . losartan  100 mg Oral Daily  . mesalamine  1,500 mg Oral BID  . metoprolol succinate  50 mg Oral BID  . mometasone-formoterol  2 puff Inhalation BID  . montelukast  10 mg Oral Daily  . pantoprazole  40 mg Oral Daily  . sodium chloride flush  3 mL Intravenous Q12H   Continuous Infusions: . sodium chloride     PRN Meds: sodium chloride, acetaminophen **AND** diphenhydrAMINE,  acetaminophen, hydroxypropyl methylcellulose / hypromellose, nitroGLYCERIN, ondansetron (ZOFRAN) IV, sodium chloride flush  Time spent: 35 minutes  Author: Berle Mull, MD [facility] 05/19/2019 8:57 PM  To reach On-call, see care teams to locate the attending and reach out to them via [email or web address]. If 7PM-7AM, please contact night-coverage If you still have difficulty reaching the attending provider, please page the [facility] (Director on Call) for [facility] on amion for assistance.

## 2019-05-19 NOTE — Progress Notes (Signed)
Occupational Therapy Evaluation Patient Details Name: Felicia Benson MRN: [id number] DOB: 1930-07-02 Today's Date: 05/19/2019    History of Present Illness Pt is an 86 y/o female admitted secondary to worsening SOB and chest pain thought to be secondary to CHF. PMH includes HTN, COPD on oxygen PRN, AAA, s/p pacemaker, and CHF.    Clinical Impression   PTA pt lived alone, independent in all ADL, IADL, and mobility tasks. Pt does not ambulate with an assistive device and reports 0 falls in the last 6 months. Pt utilized O2 at home on a PRN basis and is currently on 3L [place] at the hospital. SpO2 dropped to 80% during activity with pt requiring ~1 min seated recovery to return to 90s. No reports of SOB with education provided on pursed lip breathing strategies. Pt currently independent to min guard for self-care and mobility tasks. Pt able to ambulate around room and to/from bathroom with min guard and without use of an AD. 0 instances of LOB, however pt slightly unsteady on feet. Pt completed toileting task, LB dressing, and grooming/hygiene at the sink with supervision. Educated/instruction pt on visual compensatory strategies to increase balance and safety with good understanding. Educated and provided pt with handouts in regards to energy conservation and fall prevention. Pt demonstrates decreased endurance, balance, standing tolerance, and activity tolerance impacting ability to complete self-care and functional transfer tasks. Recommend skilled OT services to address above deficits in order to promote function and prevent further decline. Recommend HH OT for continued rehab following hospital discharge.     Follow Up Recommendations  Home health OT;Supervision - Intermittent    Equipment Recommendations  None recommended by OT    Recommendations for Other Services       Precautions / Restrictions Precautions Precautions: Fall;Other (comment) Precaution Comments: Watch  O2 Restrictions Weight Bearing Restrictions: No      Mobility Bed Mobility Overal bed mobility: Modified Independent             General bed mobility comments: HOB elevated, use of bedrail  Transfers Overall transfer level: Needs assistance Equipment used: None Transfers: Sit to/from Stand Sit to Stand: Supervision         General transfer comment: Close supervision to ensure balance and safety.    Balance Overall balance assessment: Needs assistance Sitting-balance support: Feet supported Sitting balance-Leahy Scale: Good     Standing balance support: No upper extremity supported Standing balance-Leahy Scale: Fair                             ADL either performed or assessed with clinical judgement   ADL Overall ADL's : Needs assistance/impaired Eating/Feeding: Independent;Sitting   Grooming: Wash/dry hands;Wash/dry face;Supervision/safety;Standing Grooming Details (indicate cue type and reason): While standing at the sink. Pt rested forearms on counter to complete task due to fatigue.  Upper Body Bathing: Set up;Supervision/ safety;Sitting   Lower Body Bathing: Supervison/ safety;Min guard;Sit to/from stand   Upper Body Dressing : Set up;Supervision/safety;Sitting   Lower Body Dressing: Supervision/safety;Sit to/from stand Lower Body Dressing Details (indicate cue type and reason): Able to don/doff underwear while seated requiring close supervision while standing to pull up over hips to ensure balance and safety.  Toilet Transfer: Supervision/safety;Set up;Ambulation;Regular Toilet;Grab bars   Toileting- Clothing Manipulation and Hygiene: Supervision/safety;Sit to/from stand   Tub/ Shower Transfer: Min guard;Walk-in shower;Grab bars Tub/Shower Transfer Details (indicate cue type and reason): 0 instances of LOB.  Functional mobility during ADLs: Min  guard(without an AD) General ADL Comments: Pt able to ambulate around room and to/from bathroom  with min guard and without use of AD. 0 instances of LOB, however pt unsteady on feet. Pt stood 3 min at the sink to complete grooming/hygiene tasks.      Vision Baseline Vision/History: Wears glasses;Macular Degeneration Wears Glasses: At all times Additional Comments: Pt uses increased lighting and magnifying glass at home to assist with reading, medication management, etc.      Perception     Praxis      Pertinent Vitals/Pain Pain Assessment: No/denies pain     Hand Dominance Right   Extremity/Trunk Assessment Upper Extremity Assessment Upper Extremity Assessment: Overall WFL for tasks assessed   Lower Extremity Assessment Lower Extremity Assessment: Defer to PT evaluation       Communication Communication Communication: No difficulties   Cognition Arousal/Alertness: Awake/alert Behavior During Therapy: WFL for tasks assessed/performed Overall Cognitive Status: Within Functional Limits for tasks assessed                                 General Comments: Pt pleasant and willing to participate in therapy tasks. Pt able to follow multi-step instructions without difficulty.    General Comments  SpO2 dropped to 80% on 3L [place] during activity. Pt required ~1 min seated rest break to return to 90s. Educated and provided pt with handouts regarding energy conservation and fall prevention.      Exercises Exercises: Other exercises Other Exercises Other Exercises: Encouraged pursed lip breathing throughout.    Shoulder Instructions      Home Living Family/patient expects to be discharged to:: Private residence Living Arrangements: Alone Available Help at Discharge: Family;Available PRN/intermittently(Daughter) Type of Home: House Home Access: Stairs to enter [employer] of Steps: 2 Entrance Stairs-Rails: None Home Layout: One level     Bathroom Shower/Tub: Producer, television/film/video: Standard     Home Equipment: Emergency planning/management officer - 2  wheels;Grab bars - tub/shower;Other (comment)(sock aide)          Prior Functioning/Environment Level of Independence: Independent        Comments: Pt independent in all ADL, IADL, and mobility tasks. Pt does not ambulate with an assistive device and reports 0 falls in the last 6 months. Pt does not drive.         OT Problem List: Decreased activity tolerance;Impaired balance (sitting and/or standing);Cardiopulmonary status limiting activity      OT Treatment/Interventions: Self-care/ADL training;Therapeutic exercise;Neuromuscular education;Energy conservation;DME and/or AE instruction;Therapeutic activities;Visual/perceptual remediation/compensation;Patient/family education;Balance training    OT Goals(Current goals can be found in the care plan section) Acute Rehab OT Goals Patient Stated Goal: to go home Time For Goal Achievement: 06/02/19 Potential to Achieve Goals: Good ADL Goals Pt Will Perform Tub/Shower Transfer: Shower transfer;with modified independence;shower seat Additional ADL Goal #1: Pt to complete all ADLs with modified independence, demonstrating safe management of O2 tubing. Additional ADL Goal #2: Pt to complete higher level IADLs (i.e. bed making, item retrieval) with modified independence, demonstrating safe management of O2 tubing. Additional ADL Goal #3: Pt to recall and verbalize 3 energy conservation strategies with 0 verbal cues. Additional ADL Goal #4: Pt to tolerate standing up to 5 min with modified independence, in preparation for ADLs.  OT Frequency: Min 2X/week   Barriers to D/C:            Co-evaluation  AM-PAC OT "6 Clicks" Daily Activity     Outcome Measure Help from another person eating meals?: None Help from another person taking care of personal grooming?: A Little Help from another person toileting, which includes using toliet, bedpan, or urinal?: A Little Help from another person bathing (including washing, rinsing,  drying)?: A Little Help from another person to put on and taking off regular upper body clothing?: A Little Help from another person to put on and taking off regular lower body clothing?: A Little 6 Click Score: 19   End of Session Equipment Utilized During Treatment: Gait belt;Oxygen Nurse Communication: Mobility status  Activity Tolerance: Patient limited by fatigue;Other (comment)(Limited by SOB) Patient left: in chair;with call bell/phone within reach  OT Visit Diagnosis: Unsteadiness on feet (R26.81);Other (comment)(decreased activity tolerance)                Time: 1660-6004 OT Time Calculation (min): 32 min Charges:  OT General Charges $OT Visit: 1 Visit OT Evaluation $OT Eval Low Complexity: 1 Low OT Treatments $Self Care/Home Management : 8-22 mins  Mauri Brooklyn OTR/L [phone]  Mauri Brooklyn 05/19/2019, 8:31 AM

## 2019-05-20 ENCOUNTER — Inpatient Hospital Stay (HOSPITAL_COMMUNITY): Payer: Medicare Other

## 2019-05-20 DIAGNOSIS — I495 Sick sinus syndrome: Secondary | ICD-10-CM

## 2019-05-20 DIAGNOSIS — I5033 Acute on chronic diastolic (congestive) heart failure: Secondary | ICD-10-CM

## 2019-05-20 LAB — CBC WITH DIFFERENTIAL/PLATELET
Abs Immature Granulocytes: 0.03 10*3/uL (ref 0.00–0.07)
Basophils Absolute: 0 10*3/uL (ref 0.0–0.1)
Basophils Relative: 0 %
Eosinophils Absolute: 0.2 10*3/uL (ref 0.0–0.5)
Eosinophils Relative: 2 %
HCT: 35.3 % — ABNORMAL LOW (ref 36.0–46.0)
Hemoglobin: 11.1 g/dL — ABNORMAL LOW (ref 12.0–15.0)
Immature Granulocytes: 0 %
Lymphocytes Relative: 16 %
Lymphs Abs: 1.5 10*3/uL (ref 0.7–4.0)
MCH: 28.7 pg (ref 26.0–34.0)
MCHC: 31.4 g/dL (ref 30.0–36.0)
MCV: 91.2 fL (ref 80.0–100.0)
Monocytes Absolute: 1.3 10*3/uL — ABNORMAL HIGH (ref 0.1–1.0)
Monocytes Relative: 15 %
Neutro Abs: 6.1 10*3/uL (ref 1.7–7.7)
Neutrophils Relative %: 67 %
Platelets: 285 10*3/uL (ref 150–400)
RBC: 3.87 MIL/uL (ref 3.87–5.11)
RDW: 16.4 % — ABNORMAL HIGH (ref 11.5–15.5)
WBC: 9.2 10*3/uL (ref 4.0–10.5)
nRBC: 0 % (ref 0.0–0.2)

## 2019-05-20 LAB — BASIC METABOLIC PANEL
Anion gap: 9 (ref 5–15)
BUN: 18 mg/dL (ref 8–23)
CO2: 31 mmol/L (ref 22–32)
Calcium: 8.1 mg/dL — ABNORMAL LOW (ref 8.9–10.3)
Chloride: 94 mmol/L — ABNORMAL LOW (ref 98–111)
Creatinine, Ser: 1.31 mg/dL — ABNORMAL HIGH (ref 0.44–1.00)
GFR calc Af Amer: 42 mL/min — ABNORMAL LOW (ref >60)
GFR calc non Af Amer: 37 mL/min — ABNORMAL LOW (ref >60)
Glucose, Bld: 100 mg/dL — ABNORMAL HIGH (ref 70–99)
Potassium: 3.9 mmol/L (ref 3.5–5.1)
Sodium: 134 mmol/L — ABNORMAL LOW (ref 135–145)

## 2019-05-20 LAB — MAGNESIUM: Magnesium: 1.6 mg/dL — ABNORMAL LOW (ref 1.7–2.4)

## 2019-05-20 IMAGING — US IR CHEST US
1 series · 8 of 8 positions shown · non-contrast
Comparison: Radiograph from the previous day

CLINICAL DATA: CHF with suspicion of left effusion on recent
radiography, shortness of breath

EXAM:
CHEST ULTRASOUND

[Series 1: ir (id) (id)/(id)/(id) ir · 8 of 8 slices shown]
[im 1/8]
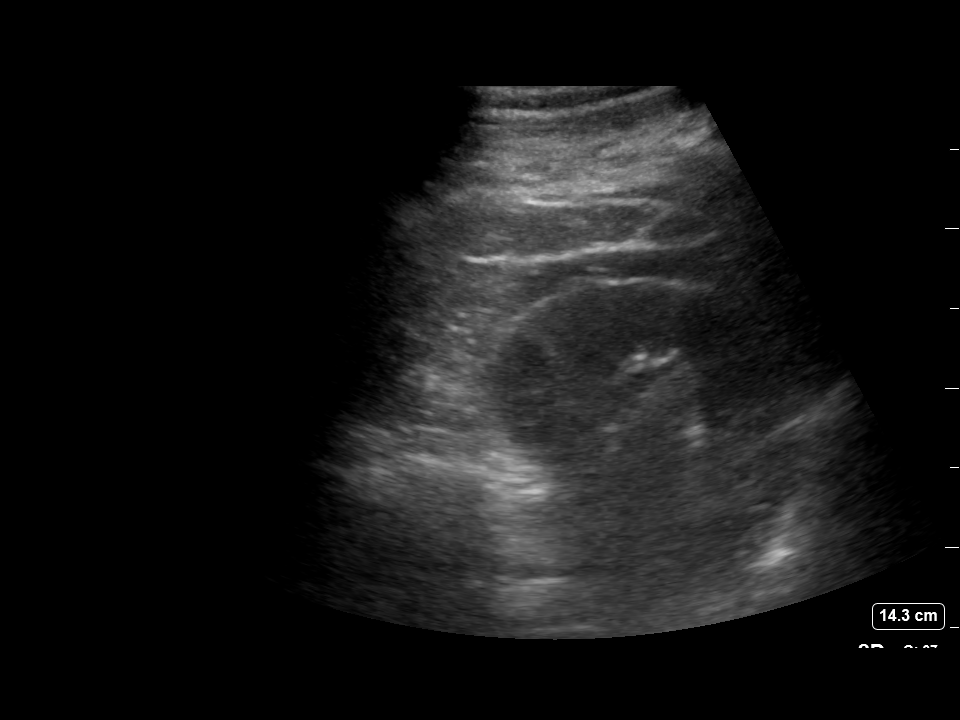
[im 2/8]
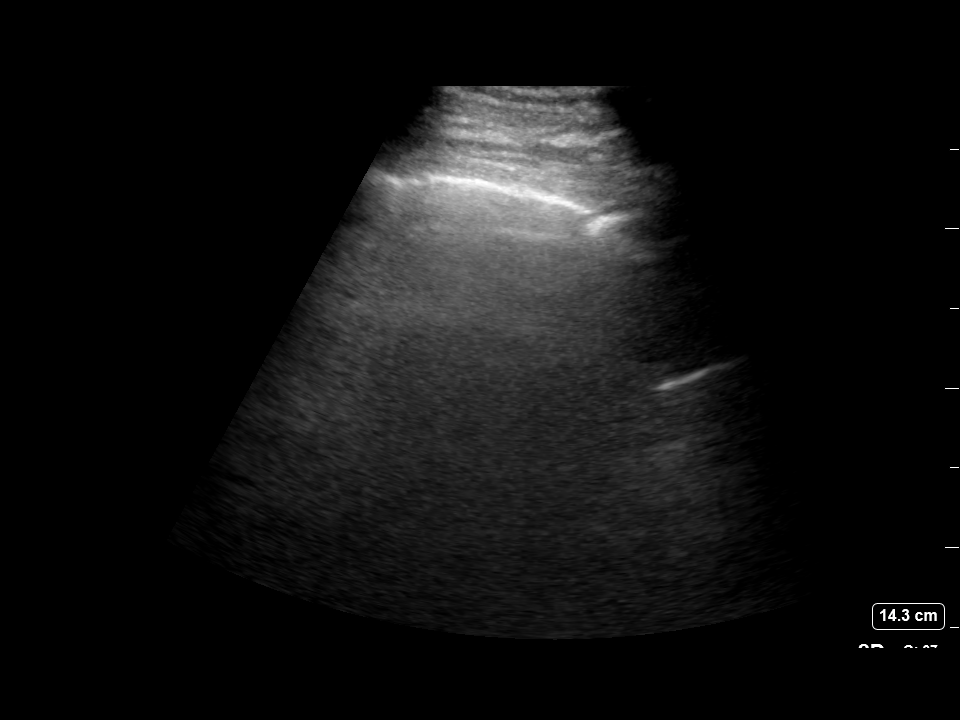
[im 3/8]
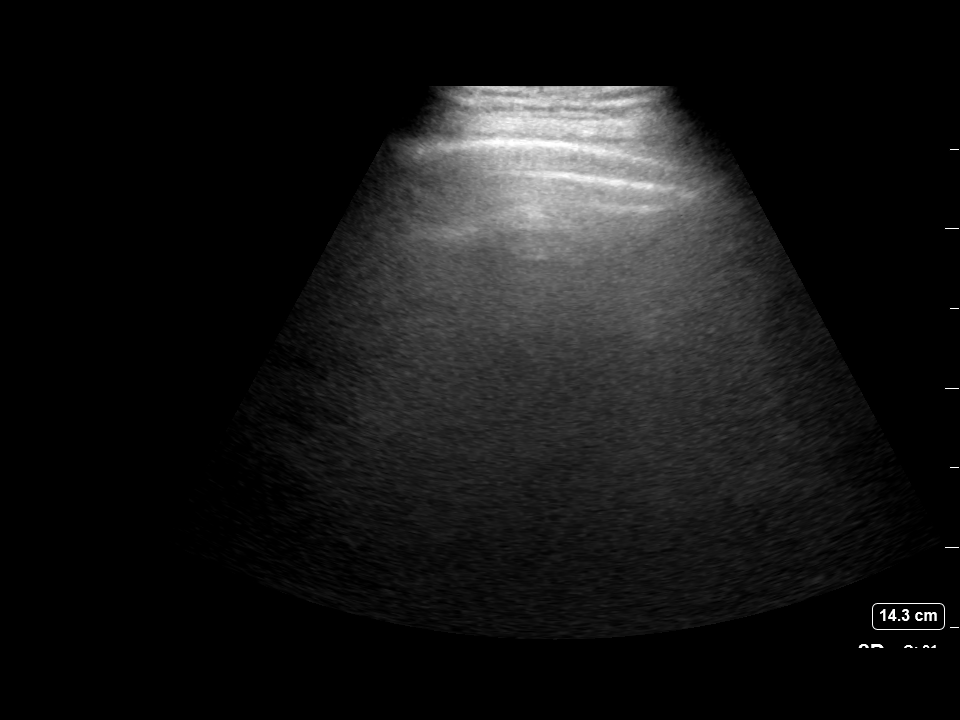
[im 4/8]
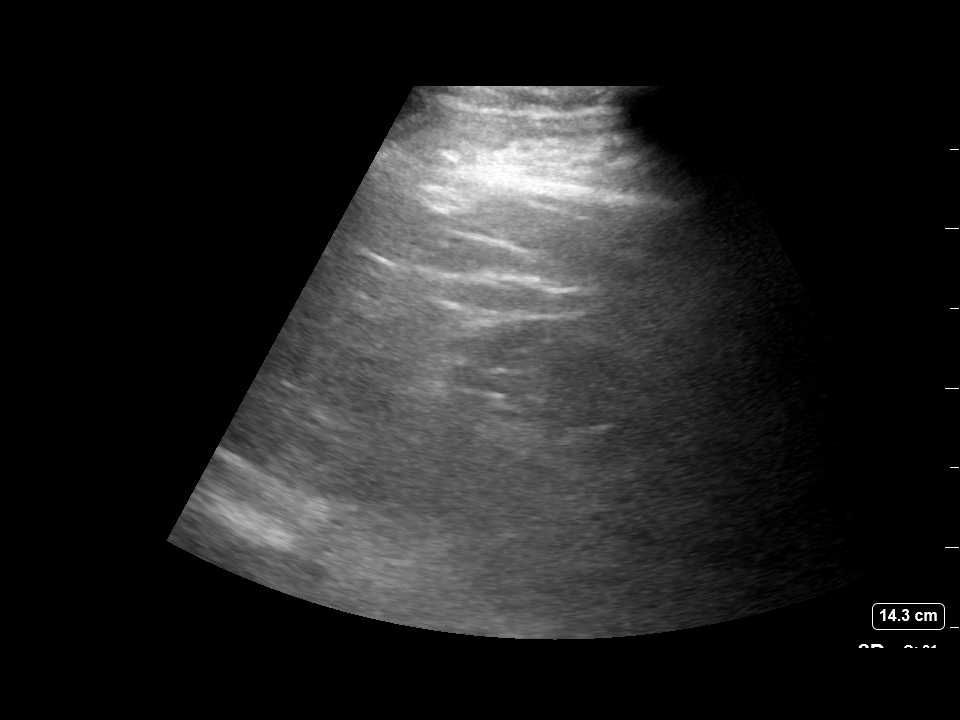
[im 5/8]
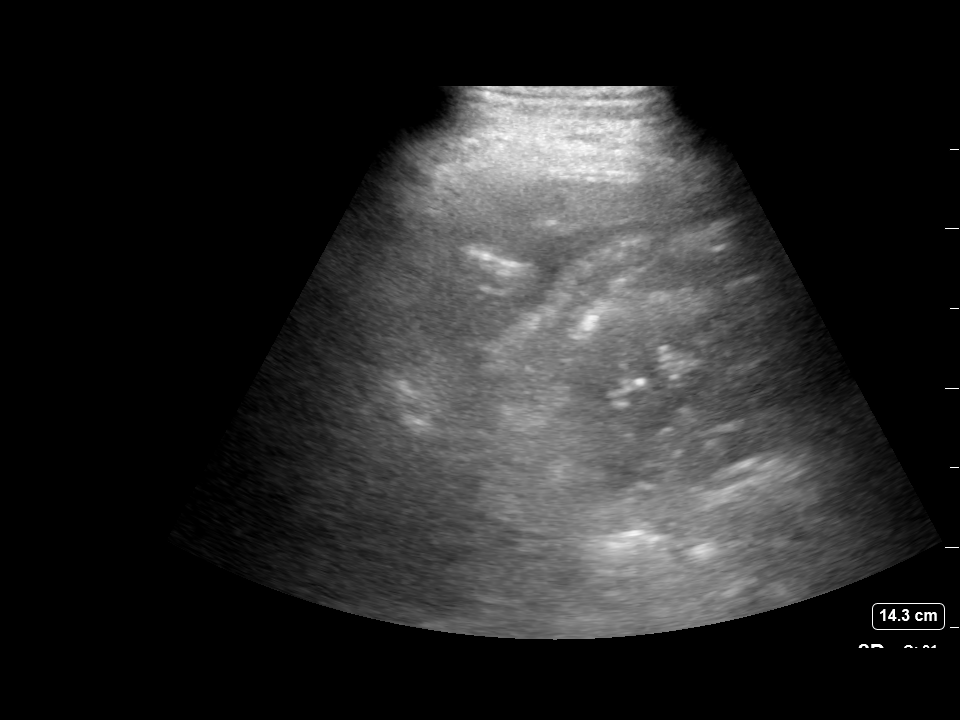
[im 6/8]
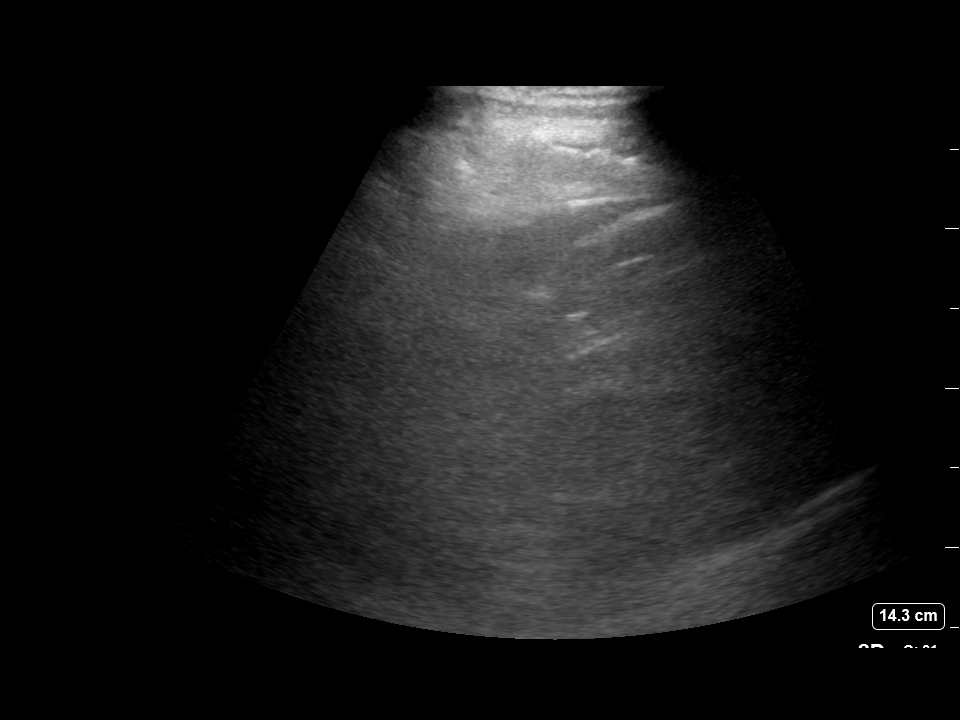
[im 7/8]
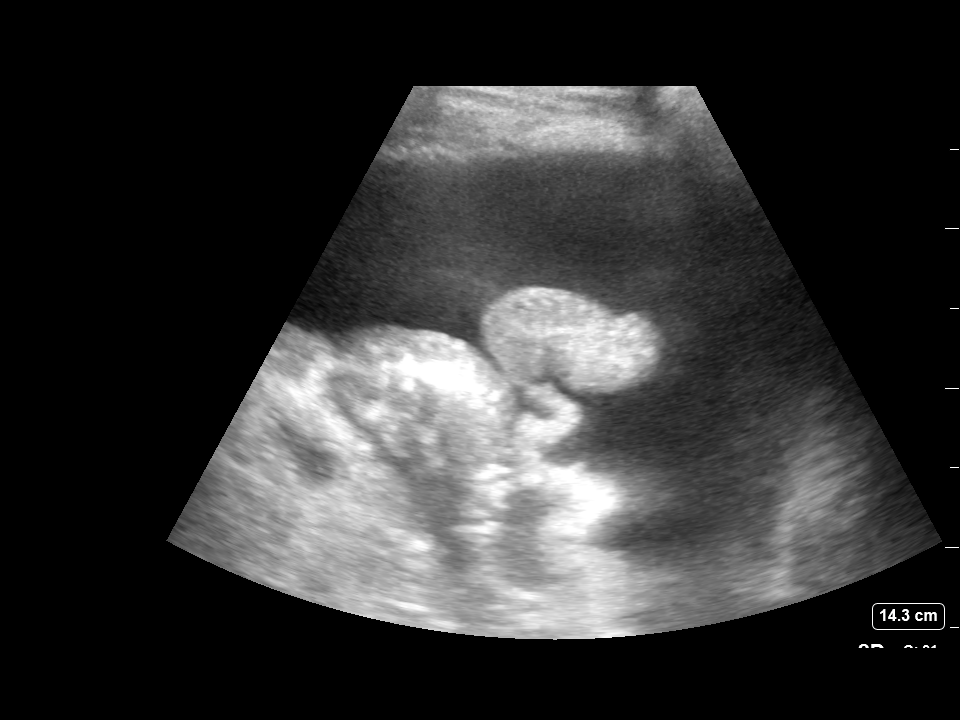
[im 8/8]
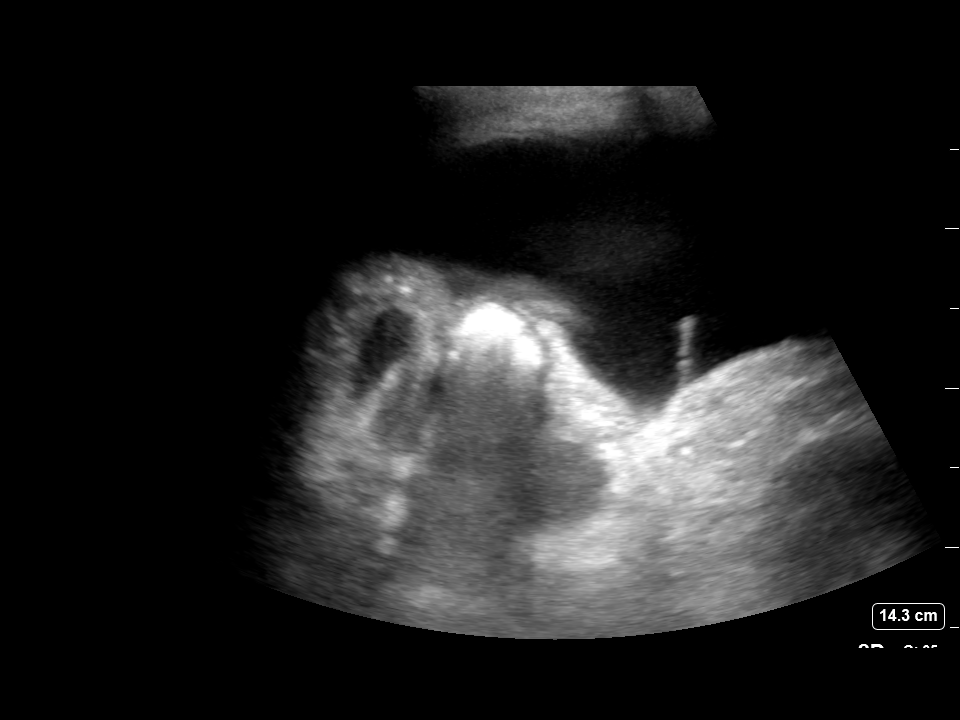

[8 of 8 positions shown; findings below may reference images not displayed]

FINDINGS: Survey ultrasound of the left hemithorax was no significant pleural
effusion. Abdominal ascites is noted.
IMPRESSION: 1. No significant left pleural effusion.  Thoracentesis deferred.
2. Abdominal ascites

## 2019-05-20 MED ORDER — MAGNESIUM SULFATE 2 GM/50ML IV SOLN
2.0000 g | Freq: Once | INTRAVENOUS | Status: AC
  Administered 2019-05-20: 2 g via INTRAVENOUS
  Filled 2019-05-20: qty 50

## 2019-05-20 MED ORDER — LIDOCAINE HCL 1 % IJ SOLN
INTRAMUSCULAR | Status: AC
  Filled 2019-05-20: qty 20

## 2019-05-20 MED ORDER — METOPROLOL SUCCINATE ER 50 MG PO TB24
50.0000 mg | ORAL_TABLET | Freq: Two times a day (BID) | ORAL | Status: DC
  Administered 2019-05-21: 50 mg via ORAL
  Filled 2019-05-20: qty 1

## 2019-05-20 MED ORDER — FUROSEMIDE 40 MG PO TABS
40.0000 mg | ORAL_TABLET | Freq: Every day | ORAL | Status: DC
  Administered 2019-05-21: 40 mg via ORAL
  Filled 2019-05-20: qty 1

## 2019-05-20 NOTE — Progress Notes (Signed)
SATURATION QUALIFICATIONS: (This note is used to comply with regulatory documentation for home oxygen)  Patient Saturations on Room Air at Rest = 91%  Patient Saturations on Room Air while Ambulating = 82%  Patient Saturations on 3 Liters of oxygen while Ambulating = 95%  -Patient desated to mid 80's during ambulation and noticeably short of breath and was tachycardic; O2 saturation picked up to 92-95% on 3L which is pt.'s baseline oxygen at home.

## 2019-05-20 NOTE — Progress Notes (Signed)
Interventional Radiology Brief Note:  Patient brought to IR for possible thoracentesis.  Limited US Chest shows no fluid accumulation on the left.  No procedure performed.  Patient returned to unit.   Loyce Dys, MS RD PA-C 1:09 PM

## 2019-05-20 NOTE — TOC Progression Note (Signed)
Transition of Care [facility]) - Progression Note    Patient Details  Name: Felicia Benson MRN: [id number] Date of Birth: 05-Dec-1930  [facility] of [facility] CM/SW Contact  Leone Haven, RN Phone Number: 05/20/2019, 8:50 AM  Clinical Narrative:    NCM spoke with patient at bedside ,she is a retired Char[employer] fundraiser, she lives alone.  She is at her daughters for a couple more days and then she will be home this weekend. NCM offered choice for [facility] for CHF disease management.         Expected Discharge Plan and Services                                                 Social Determinants of Health (SDOH) Interventions    Readmission Risk Interventions No flowsheet data found.

## 2019-05-20 NOTE — Progress Notes (Signed)
PROGRESS NOTE  Felicia Benson HUD:[id number] DOB: February 20, 1930   PCP: System, Pcp Not In  Patient is from: Home.  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  Started on IV Lasix.  Also found to have left-sided pleural effusion.   Subjective: Seen and examined earlier this morning.  No major events overnight or this morning.  Reports feeling better except for dyspnea on exertion.  She denies chest pain, GI or UTI symptoms.  Objective: Vitals:   05/20/19 0553 05/20/19 0828 05/20/19 1138 05/20/19 1258  BP:   120/82 130/72  Pulse:   64   Resp:   20   Temp:   97.6 F (36.4 C)   TempSrc:   Oral   SpO2:  90% 97%   Weight: 92.8 kg     Height:        Intake/Output Summary (Last 24 hours) at 05/20/2019 1636 Last data filed at 05/20/2019 1334 Gross per 24 hour  Intake 1321 ml  Output 3151 ml  Net -1830 ml   Filed Weights   05/18/19 0341 05/19/19 0334 05/20/19 0553  Weight: 90.7 kg 94.2 kg 92.8 kg    Examination:  GENERAL: No apparent distress.  Nontoxic. HEENT: MMM.  Vision and hearing grossly intact.  NECK: Supple.  No apparent JVD.  RESP: On RA.  No IWOB.  Fair aeration bilaterally. CVS:  RRR. Heart sounds normal.  ABD/GI/GU: BS+. Abd soft, NTND.  MSK/EXT:  Moves extremities. No apparent deformity.  Trace edema bilaterally SKIN: no apparent skin lesion or wound NEURO: Awake, alert and oriented appropriately.  No apparent focal neuro deficit. PSYCH: Calm. Normal affect.  Procedures:  None  Microbiology summarized: COVID-19 PCR negative. Influenza PCR negative.  Assessment & Plan: Acute on chronic diastolic CHF: Echo with EF of 60 to 65% and no RWMA.  Improved with IV Lasix.  Good urine output but creatinine  uptrending.  Followed by cardiology in [place]. -Received a.m. IV Lasix.  Hold p.m. IV Lasix.  Will transition to p.o. in the morning -Monitor fluid status, renal function and electrolytes. -Fluid and sodium restrictions.  Chest tightness: Likely due to the above.  Serial troponin and EKG without acute ischemic finding.  Echo reassuring. -Manage CHF as above  Acute on chronic hypoxic respiratory failure: Likely due to CHF. Chronic COPD: Stable -Required higher than baseline O2 -Wean oxygen as able  Hyponatremia-likely hypervolemic. -Continue monitoring  Hypomagnesemia -Replenish and recheck  Left-sided pleural effusion: Noted on CXR on 5/11.  Thoracocentesis ordered but no significant fluid to be drained per IR today -Diuretics as above.  Essential hypertension: Normotensive -Amlodipine and hydralazine held in the setting of edema -Continue metoprolol and losartan.  May have to hold losartan if renal function worse  Normocytic anemia: H&H stable. -Continue monitoring  Sick sinus syndrome s/p PPM-no issues here.  Hyperlipidemia -Continue statin  Crohn's disease: Stable.  Does not seem to be on meds. -Outpatient follow-up . GERD -Continue PPI  Class I obesity: Body mass index is 33.01 kg/m. -Encourage lifestyle change to lose weight.            DVT prophylaxis: Subcu Lovenox Code Status: Full code Family Communication: Patient and/or RN. Available if any question.  Status is: Inpatient  Remains inpatient appropriate because:IV treatments  appropriate due to intensity of illness or inability to take PO, Inpatient level of care appropriate due to severity of illness and Acute respiratory failure requiring higher than baseline oxygen   Dispo: The patient is from: Home              Anticipated d/c is to: Home              Anticipated d/c date is: 1 day              Patient currently is not medically stable to d/c.        Consultants:   None   Sch Meds:  Scheduled Meds: . aspirin EC  81 mg Oral Daily  . atorvastatin  20 mg Oral Daily  . enoxaparin (LOVENOX) injection  40 mg Subcutaneous Q24H  . [START ON 05/21/2019] furosemide  40 mg Oral Daily  . losartan  100 mg Oral Daily  . mesalamine  1,500 mg Oral BID  . metoprolol succinate  50 mg Oral BID  . mometasone-formoterol  2 puff Inhalation BID  . montelukast  10 mg Oral Daily  . pantoprazole  40 mg Oral Daily  . sodium chloride flush  3 mL Intravenous Q12H   Continuous Infusions: . sodium chloride    . magnesium sulfate bolus IVPB 2 g (05/20/19 1616)   PRN Meds:.sodium chloride, acetaminophen **AND** diphenhydrAMINE, acetaminophen, hydroxypropyl methylcellulose / hypromellose, nitroGLYCERIN, ondansetron (ZOFRAN) IV, sodium chloride flush  Antimicrobials: Anti-infectives (From admission, onward)   None       I have personally reviewed the following labs and images: CBC: Recent Labs  Lab 05/18/19 0423 05/19/19 0536 05/20/19 0834  WBC 6.6 6.8 9.2  NEUTROABS 4.4  --  6.1  HGB 11.2* 10.6* 11.1*  HCT 36.6 33.6* 35.3*  MCV 90.8 89.6 91.2  PLT 266 249 285   BMP &GFR Recent Labs  Lab 05/18/19 0423 05/19/19 0536 05/20/19 0834  NA 134* 136 134*  K 4.9 3.4* 3.9  CL 100 97* 94*  CO2 24 32 31  GLUCOSE 97 91 100*  BUN 16 14 18   CREATININE 1.10* 1.26* 1.31*  CALCIUM 8.1* 7.9* 8.1*  MG  --   --  1.6*   Estimated Creatinine Clearance: 34.7 mL/min (A) (by C-G formula based on SCr of 1.31 mg/dL (H)). Liver & Pancreas: No results for input(s): AST, ALT, ALKPHOS, BILITOT, PROT, ALBUMIN in the last 168 hours. No results for input(s): LIPASE, AMYLASE in the last 168 hours. No results for input(s): AMMONIA in the last 168 hours. Diabetic: No results for input(s): HGBA1C in the last 72 hours. No results for input(s): GLUCAP in the last 168 hours. Cardiac Enzymes: No results for input(s): CKTOTAL, CKMB, CKMBINDEX, TROPONINI in the last 168 hours. No  results for input(s): PROBNP in the last 8760 hours. Coagulation Profile: No results for input(s): INR, PROTIME in the last 168 hours. Thyroid Function Tests: Recent Labs    05/18/19 1012  TSH 0.699   Lipid Profile: No results for input(s): CHOL, HDL, LDLCALC, TRIG, CHOLHDL, LDLDIRECT in the last 72 hours. Anemia Panel: No results for input(s): VITAMINB12, FOLATE, FERRITIN, TIBC, IRON, RETICCTPCT in the last 72 hours. Urine analysis: No results found for: COLORURINE, APPEARANCEUR, LABSPEC, PHURINE, GLUCOSEU, HGBUR, BILIRUBINUR, KETONESUR, PROTEINUR, UROBILINOGEN, NITRITE, LEUKOCYTESUR Sepsis Labs: Invalid input(s): PROCALCITONIN, LACTICIDVEN  Microbiology: Recent Results (from the past 240 hour(s))  Respiratory Panel by RT PCR (Flu A&B, Covid) - Nasopharyngeal Swab     Status: None   Collection Time:  05/18/19  3:52 AM   Specimen: Nasopharyngeal Swab  Result Value Ref Range Status   SARS Coronavirus 2 by RT PCR NEGATIVE NEGATIVE Final    Comment: (NOTE) SARS-CoV-2 target nucleic acids are NOT DETECTED. The SARS-CoV-2 RNA is generally detectable in upper respiratoy specimens during the acute phase of infection. The lowest concentration of SARS-CoV-2 viral copies this assay can detect is 131 copies/mL. A negative result does not preclude SARS-Cov-2 infection and should not be used as the sole basis for treatment or other patient management decisions. A negative result may occur with  improper specimen collection/handling, submission of specimen other than nasopharyngeal swab, presence of viral mutation(s) within the areas targeted by this assay, and inadequate number of viral copies (<131 copies/mL). A negative result must be combined with clinical observations, patient history, and epidemiological information. The expected result is Negative. Fact Sheet for Patients:  PinkCheek.be Fact Sheet for Healthcare Providers:   GravelBags.it This test is not yet ap proved or cleared by the [place] FDA and  has been authorized for detection and/or diagnosis of SARS-CoV-2 by FDA under an Emergency Use Authorization (EUA). This EUA will remain  in effect (meaning this test can be used) for the duration of the COVID-19 declaration under Section 564(b)(1) of the Act, 21 U.S.C. section 360bbb-3(b)(1), unless the authorization is terminated or revoked sooner.    Influenza A by PCR NEGATIVE NEGATIVE Final   Influenza B by PCR NEGATIVE NEGATIVE Final    Comment: (NOTE) The Xpert Xpress SARS-CoV-2/FLU/RSV assay is intended as an aid in  the diagnosis of influenza from Nasopharyngeal swab specimens and  should not be used as a sole basis for treatment. Nasal washings and  aspirates are unacceptable for Xpert Xpress SARS-CoV-2/FLU/RSV  testing. Fact Sheet for Patients: PinkCheek.be Fact Sheet for Healthcare Providers: GravelBags.it This test is not yet approved or cleared by the [place] FDA and  has been authorized for detection and/or diagnosis of SARS-CoV-2 by  FDA under an Emergency Use Authorization (EUA). This EUA will remain  in effect (meaning this test can be used) for the duration of the  Covid-19 declaration under Section 564(b)(1) of the Act, 21  U.S.C. section 360bbb-3(b)(1), unless the authorization is  terminated or revoked. Performed at [facility], [place] [address] [place], [place] [address]     Radiology Studies: IR US CHEST  Result Date: 05/20/2019 CLINICAL DATA:  CHF with suspicion of left effusion on recent radiography, shortness of breath EXAM: CHEST ULTRASOUND COMPARISON:  Radiograph from the previous day FINDINGS: Survey ultrasound of the left hemithorax was no significant pleural effusion. Abdominal ascites is noted. IMPRESSION: 1. No significant left pleural effusion.  Thoracentesis  deferred. 2. Abdominal ascites Electronically Signed   By: Lucrezia Europe M.D.   On: 05/20/2019 15:22     Merdith Boyd T. [place]  If 7PM-7AM, please contact night-coverage [email or web address] Password [email or web address] 05/20/2019, 4:36 PM

## 2019-05-20 NOTE — TOC Transition Note (Signed)
Transition of Care [facility]) - CM/SW Discharge Note   Patient Details  Name: Felicia Benson MRN: [id number] Date of Birth: 1931/01/03  [facility]) CM/SW Contact:  Leone Haven, RN Phone Number: 05/20/2019, 9:36 AM   Clinical Narrative:    NCM spoke with patient at bedside ,she is a retired Char[employer] fundraiser, she lives alone.  She is at her daughters for a couple more days and then she will be home this weekend. NCM offered choice for [facility] for CHF disease management.  She chose [facility].  Referral given to [place], she is able to take referral with soc next week. Will need HHRN order.     Final next level of care: Home w Home Health Services Barriers to Discharge: Continued Medical Work up   Patient Goals and CMS Choice Patient states their goals for this hospitalization and ongoing recovery are:: get better CMS Medicare.gov Compare Post Acute Care list provided to:: Patient Choice offered to / list presented to : Patient  Discharge Placement                       Discharge Plan and Services                  DME Agency: NA       HH Arranged: RN, Disease Management HH Agency: [facility] Date [facility] Agency Contacted: 05/20/19 Time HH Agency Contacted: [phone] Representative spoke with at [facility] Agency: [place]  Social Determinants of Health (SDOH) Interventions     Readmission Risk Interventions No flowsheet data found.

## 2019-05-20 NOTE — Progress Notes (Signed)
Physical Therapy Treatment Patient Details Name: Felicia Benson MRN: [id number] DOB: [birth year] Today's Date: 05/20/2019    History of Present Illness Pt is an 86 y/o female admitted secondary to worsening SOB and chest pain thought to be secondary to CHF. PMH includes HTN, COPD on oxygen PRN, AAA, s/p pacemaker, and CHF.     PT Comments    Pt seen for mobility progression. Of note, pt on RA upon arrival with SpO2 at 83%. Pt was instructed in pursed-lip breathing with slow rise of SpO2 to 87%. PT reapplied 2L of O2 via [place] with slow rise to 90%. With activity (sitting EOB and ambulation), SpO2 increasing to 94-95%. Pt asymptomatic throughout. PT will continue to follow pt acutely to progress mobility as tolerated and to ensure a safe d/c home.    Follow Up Recommendations  No PT follow up     Equipment Recommendations  None recommended by PT    Recommendations for Other Services       Precautions / Restrictions Precautions Precautions: Fall;Other (comment) Precaution Comments: Watch O2 Restrictions Weight Bearing Restrictions: No    Mobility  Bed Mobility Overal bed mobility: Modified Independent             General bed mobility comments: HOB elevated, use of bedrail  Transfers Overall transfer level: Needs assistance Equipment used: None Transfers: Sit to/from Stand Sit to Stand: Supervision         General transfer comment: supervision for safety, pt steady overall with transition  Ambulation/Gait Ambulation/Gait assistance: Min guard Gait Distance (Feet): 40 Feet Assistive device: None Gait Pattern/deviations: Step-through pattern;Decreased stride length Gait velocity: Decreased   General Gait Details: pt overall steady with ambulation in room; no LOB or need for physical assistance. Pt on 2L of O2 via [place] throughout   Stairs             Wheelchair Mobility    Modified Rankin (Stroke Patients Only)       Balance Overall balance  assessment: Needs assistance Sitting-balance support: Feet supported Sitting balance-Leahy Scale: Good     Standing balance support: No upper extremity supported Standing balance-Leahy Scale: Fair                              Cognition Arousal/Alertness: Awake/alert Behavior During Therapy: WFL for tasks assessed/performed Overall Cognitive Status: Within Functional Limits for tasks assessed                                        Exercises General Exercises - Lower Extremity Ankle Circles/Pumps: AROM;Both;20 reps;Seated Long Arc Quad: AROM;Strengthening;Both;10 reps;Seated Hip Flexion/Marching: Seated;AROM;Strengthening;Both;10 reps    General Comments        Pertinent Vitals/Pain Pain Assessment: No/denies pain    Home Living                      Prior Function            PT Goals (current goals can now be found in the care plan section) Acute Rehab PT Goals PT Goal Formulation: With patient Time For Goal Achievement: 06/01/19 Potential to Achieve Goals: Good Progress towards PT goals: Progressing toward goals    Frequency    Min 3X/week      PT Plan Current plan remains appropriate    Co-evaluation  AM-PAC PT "6 Clicks" Mobility   Outcome Measure  Help needed turning from your back to your side while in a flat bed without using bedrails?: None Help needed moving from lying on your back to sitting on the side of a flat bed without using bedrails?: None Help needed moving to and from a bed to a chair (including a wheelchair)?: None Help needed standing up from a chair using your arms (e.g., wheelchair or bedside chair)?: None Help needed to walk in hospital room?: None Help needed climbing 3-5 steps with a railing? : A Little 6 Click Score: 23    End of Session Equipment Utilized During Treatment: Oxygen Activity Tolerance: Patient tolerated treatment well Patient left: in bed;with call  bell/phone within reach Nurse Communication: Mobility status PT Visit Diagnosis: Other abnormalities of gait and mobility (R26.89)     Time: 2595-6387 PT Time Calculation (min) (ACUTE ONLY): 13 min  Charges:  $Gait Training: 8-22 mins                     Anastasio Champion, DPT  Acute Rehabilitation Services Pager [phone] Office [place] 05/20/2019, 2:07 PM

## 2019-05-21 LAB — RENAL FUNCTION PANEL
Albumin: 2.4 g/dL — ABNORMAL LOW (ref 3.5–5.0)
Anion gap: 10 (ref 5–15)
BUN: 19 mg/dL (ref 8–23)
CO2: 33 mmol/L — ABNORMAL HIGH (ref 22–32)
Calcium: 7.9 mg/dL — ABNORMAL LOW (ref 8.9–10.3)
Chloride: 90 mmol/L — ABNORMAL LOW (ref 98–111)
Creatinine, Ser: 1.27 mg/dL — ABNORMAL HIGH (ref 0.44–1.00)
GFR calc Af Amer: 44 mL/min — ABNORMAL LOW (ref >60)
GFR calc non Af Amer: 38 mL/min — ABNORMAL LOW (ref >60)
Glucose, Bld: 113 mg/dL — ABNORMAL HIGH (ref 70–99)
Phosphorus: 3.2 mg/dL (ref 2.5–4.6)
Potassium: 3.7 mmol/L (ref 3.5–5.1)
Sodium: 133 mmol/L — ABNORMAL LOW (ref 135–145)

## 2019-05-21 LAB — MAGNESIUM: Magnesium: 1.9 mg/dL (ref 1.7–2.4)

## 2019-05-21 MED ORDER — LOSARTAN POTASSIUM 25 MG PO TABS: 25.0000 mg | ORAL_TABLET | Freq: Every day | ORAL | 1 refills | Status: DC

## 2019-05-21 MED ORDER — FUROSEMIDE 40 MG PO TABS: 40.0000 mg | ORAL_TABLET | Freq: Every day | ORAL | 1 refills | Status: DC

## 2019-05-21 NOTE — TOC Transition Note (Signed)
Transition of Care [facility]) - CM/SW Discharge Note   Patient Details  Name: Felicia Benson MRN: [id number] Date of Birth: [birth year]  [facility]) CM/SW Contact:  Leone Haven, RN Phone Number: 05/21/2019, 11:54 AM   Clinical Narrative:     Patient is for dc today, NCM notified [place] with [facility], she is going to daughters house today and will be going home on Saturday,  [place] will come out to see her next week when she gets home.  She has oxygen at her  Daughter's home and she has a PCP . Dr. Allena Katz in [place].  She has no other needs.   Final next level of care: Home w Home Health Services Barriers to Discharge: No Barriers Identified   Patient Goals and CMS Choice Patient states their goals for this hospitalization and ongoing recovery are:: get better CMS Medicare.gov Compare Post Acute Care list provided to:: Patient Choice offered to / list presented to : Patient  Discharge Placement                       Discharge Plan and Services                  DME Agency: NA       HH Arranged: RN [facility] Agency: [facility] Date [facility] Agency Contacted: 05/19/19 Time HH Agency Contacted: [phone] Representative spoke with at [facility] Agency: [place]  Social Determinants of Health (SDOH) Interventions     Readmission Risk Interventions No flowsheet data found.

## 2019-05-21 NOTE — Discharge Summary (Signed)
Physician Discharge Summary  Felicia Benson VFI:[id number] DOB: November 09, 1930 DOA: 05/18/2019  PCP: System, Pcp Not In  Admit date: 05/18/2019 Discharge date: 05/21/2019  Admitted From: Home Disposition: Home  Recommendations for Outpatient Follow-up:  1. Follow ups as below. 2. Please obtain CBC/BMP/Mag at follow up 3. Please follow up on the following pending results: None  Home Health: [place] RN Equipment/Devices: None  Discharge Condition: Stable CODE STATUS: Full code  Follow-up [place], [place] [facility] Why: [place] APPT. Contact information: [place] [address] [phone]        [name] patel. Schedule an appointment as soon as possible for a visit in 1 week(s).   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  Started on IV Lasix.  Also found to have left-sided pleural effusion.  IR consulted for thoracocentesis which was cancelled as he had pleural effusion improved after IV Lasix.  Patient responded well to IV Lasix.  She had net -6.6 L during this hospitalization with improvement in her symptoms.  She was evaluated by physical and Occupational Therapy, and no need was identified.  She was ambulated on room air and desaturated to 84% but recovered to 93% on 2 L which is home level.  She was discharged on p.o. Lasix 40 mg daily with additional 40 mg as needed.  She was counseled on sodium and fluid restriction as well as daily weight and avoiding NSAIDs and alcohol.  Home health RN ordered on discharge.  Recommended follow-up with PCP and her  cardiologist in 1 to 2 weeks.  See individual problem list below for more hospital course.  Discharge Diagnoses:  Acute on chronic diastolic CHF: Echo with EF of 60 to 65% and no RWMA.  Improved with IV Lasix.   Net -6.6 L during this admission.  Significant improvement in his symptoms. -P.o. Lasix 40 mg daily with additional 40 mg as needed -Counseled on sodium and fluid restriction, daily weight, NSAID and EtOH  Chest tightness: Serial troponin and EKG without acute ischemic finding.  Echo reassuring.  Resolved after diuresis. -Manage CHF as above  Acute on chronic hypoxic respiratory failure: Likely due to CHF. Chronic COPD: Stable -Stable on room air.  Required 2 L with ambulation  Hyponatremia-likely hypervolemic. -Recheck BMP at follow-up.  Hypomagnesemia: Resolved.  Left-sided pleural effusion: Noted on CXR on 5/11.  Thoracocentesis ordered but no significant fluid to be drained per IR after IV Lasix. -Continue p.o. Lasix as above  Essential hypertension: Normotensive -Continue home amlodipine and metoprolol XL.  Stop hydralazine and reduced home losartan to 25 mg daily -P.o. Lasix as above  Normocytic anemia: H&H stable. -Recheck CBC at follow-up  Sick sinus syndrome s/p PPM-no issues here.  Hyperlipidemia -Continue statin  Crohn's disease: Stable.   -Continue home mesalamine . GERD -Continue PPI  Class I obesity: Body mass index is 33.01 kg/m. -Encourage lifestyle change to lose weight.                 Discharge Exam: Vitals:   05/21/19 0519 05/21/19 0740  BP: (!) 114/59  Pulse: (!) 59   Resp: 15   Temp: 98.3 F (36.8 C)   SpO2: 93% 94%    GENERAL: No apparent distress.  Nontoxic. HEENT: MMM.  Vision and hearing grossly intact.  NECK: Supple.  No apparent JVD.  RESP: On room air.  No IWOB.  Fair aeration bilaterally. CVS:  RRR. Heart sounds normal.  ABD/GI/GU: Bowel sounds present. Soft. Non tender.  MSK/EXT:  Moves  extremities. No apparent deformity. No edema.  SKIN: no apparent skin lesion or wound NEURO: Awake, alert and oriented appropriately.  No apparent focal neuro deficit. PSYCH: Calm. Normal affect.  Discharge Instructions  Discharge Instructions    (HEART FAILURE PATIENTS) Call MD:  Anytime you have any of the following symptoms: 1) 3 pound weight gain in 24 hours or 5 pounds in 1 week 2) shortness of breath, with or without a dry hacking cough 3) swelling in the hands, feet or stomach 4) if you have to sleep on extra pillows at night in order to breathe.   Complete by: As directed    Call MD for:  extreme fatigue   Complete by: As directed    Call MD for:  persistant dizziness or light-headedness   Complete by: As directed    Call MD for:  persistant nausea and vomiting   Complete by: As directed    Diet - low sodium heart healthy   Complete by: As directed    Discharge instructions   Complete by: As directed    It has been a pleasure taking care of you! You were hospitalized for shortness of breath and lower extremity swelling due to congestive heart failure exacerbation.  You were treated with intravenous Lasix.  With that, your symptoms improved to the point we think it is safe to let you go home and continue oral Lasix.  We have made some adjustment to your home medications during this hospitalization. Please review your new medication list and the directions before you take your medications.  In addition to taking your medications as prescribed, it is important that you avoid any alcoholic beverages or over-the-counter pain medication other than plain Tylenol, limit the amount of water/fluid you drink to less than 6 cups (1500 cc) a day,  limit your sodium (salt) intake to less than 2 g (2000 mg) a day and weigh yourself daily at the same time and keeping your weight log.  Please follow-up with your primary care doctor and cardiologist in 1 to 2 weeks or sooner if needed  Take care,    Increase activity slowly   Complete by: As directed      Allergies as of 05/21/2019   No Known Allergies     Medication List    STOP taking these medications   hydrALAZINE 25 MG tablet Commonly known as: APRESOLINE     TAKE these medications   acetaminophen 325 MG tablet Commonly known as: TYLENOL Take 650 mg by mouth 2 (two) times daily as needed for mild pain or headache.   amLODipine 10 MG tablet Commonly known as: NORVASC Take 1 tablet (10 mg total) by mouth every evening. Hold for next few days   aspirin 81 MG EC tablet Take 1 tablet by mouth daily.   atorvastatin 20 MG tablet Commonly known as: LIPITOR Take 1 tablet by mouth daily.   esomeprazole 40 MG capsule Commonly known as: NEXIUM Take 40 mg by mouth daily.   furosemide 40 MG tablet Commonly known as: LASIX Take 1 tablet (40  mg total) by mouth daily. May take additional 1 tablet (40 mg) for more swelling, shortness of breath or over 2 lbs weight gain Start taking on: May 22, 2019   hydroxypropyl methylcellulose / hypromellose 2.5 % ophthalmic solution Commonly known as: ISOPTO TEARS / GONIOVISC Place 1 drop into both eyes 2 (two) times daily as needed for dry eyes.   losartan 25 MG tablet Commonly known as: COZAAR Take 1 tablet (25 mg total) by mouth daily. What changed:   medication strength  how much to take   metoprolol succinate 25 MG 24 hr tablet Commonly known as: TOPROL-XL Take 50 mg by mouth 2 (two) times daily.   montelukast 10 MG tablet Commonly known as: SINGULAIR Take 10 mg by mouth daily.   multivitamin-lutein Caps capsule Take 1 capsule by mouth daily.   nitroGLYCERIN 0.3 MG SL tablet Commonly known as: NITROSTAT Place 1 tablet under the tongue every 5 (five) hours as needed for chest pain.   Pentasa 500 MG CR capsule Generic drug: mesalamine Take 1,500 mg by mouth 2 (two) times daily.   Symbicort 160-4.5 MCG/ACT inhaler Generic drug: budesonide-formoterol Inhale 2  puffs into the lungs in the morning and at bedtime.   Vitamin D3 125 MCG (5000 UT) Tabs Take 5,000 Units by mouth daily.       Consultations:  None  Procedures/Studies: 1. Left ventricular ejection fraction, by estimation, is 60 to 65%. The  left ventricle has normal function. The left ventricle has no regional  wall motion abnormalities. There is moderate asymmetric left ventricular  hypertrophy of the basal-septal  segment. Left ventricular diastolic parameters are indeterminate.  2. Right ventricular systolic function is normal. The right ventricular  size is normal.  3. The mitral valve is normal in structure. Trivial mitral valve  regurgitation.  4. The aortic valve is tricuspid. Aortic valve regurgitation is trivial.  Mild aortic valve sclerosis is present, with no evidence of aortic valve  stenosis.  5. Aortic dilatation noted. 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    DG Chest 2 View  Result Date: 05/19/2019 CLINICAL DATA:  Shortness of breath beginning 3 days ago. EXAM: CHEST - 2 VIEW COMPARISON:  05/18/2019 FINDINGS: Cardiomegaly. Aortic atherosclerosis. Dual lead pacemaker appearing unchanged. Enlarging left effusion with worsening volume loss at the left base. Pulmonary venous hypertension, possibly with early interstitial edema. No acute bone finding. IMPRESSION: Probable worsening congestive heart failure. Cardiomegaly. Aortic atherosclerosis. Enlarging left effusion with left base volume loss. Early interstitial edema. Electronically Signed   By: Paulina Fusi M.D.   On: 05/19/2019 13:01   DG Chest Portable 1 View  Result Date: 05/18/2019 CLINICAL DATA:  Chest pain and shortness of breath. EXAM: PORTABLE CHEST 1 VIEW COMPARISON:  None. FINDINGS: Heart is enlarged. Mild pulmonary vascular congestion is present without frank edema. Atherosclerotic changes are noted at the arch. Small effusions are present. Bibasilar airspace opacities are  noted. The upper lung fields are clear. Pacing wires are in satisfactory position. Surgical clips are present along the left neck. IMPRESSION: 1. Cardiomegaly with mild pulmonary vascular congestion and small bilateral pleural effusions compatible with congestive heart failure. 2. Bibasilar airspace disease likely reflects atelectasis. Infection is not excluded. 3. Atherosclerosis of the thoracic aorta. Electronically Signed   By: Marin Roberts M.D.   On: 05/18/2019 04:41   ECHOCARDIOGRAM COMPLETE  Result Date: 05/18/2019    ECHOCARDIOGRAM REPORT   Patient Name:   JESSEE NEWNAM [facility] Date of Exam:  05/18/2019 Medical Rec #:  [id number]        Height:       66.0 in Accession #:    [id number]       Weight:       200.0 lb Date of Birth:  [birth year]         BSA:          2.000 m Patient Age:    86 years         BP:           134/68 mmHg Patient Gender: F                HR:           64 bpm. Exam Location:  Inpatient Procedure: 2D Echo, Color Doppler and Cardiac Doppler Indications:    CHF-Acute Diastolic 428.31/I50.31  History:        Patient has no prior history of Echocardiogram examinations.                 CHF, CAD, Pacemaker, COPD; Risk Factors:Hypertension,                 Dyslipidemia, Sleep Apnea and Former Smoker.  Sonographer:    Ross Ludwig RDCS (AE) Referring Phys: [id number] [facility] A SMITH  Sonographer Comments: No subcostal window and patient is morbidly obese. IMPRESSIONS  1. Left ventricular ejection fraction, by estimation, is 60 to 65%. The left ventricle has normal function. The left ventricle has no regional wall motion abnormalities. There is moderate asymmetric left ventricular hypertrophy of the basal-septal segment. Left ventricular diastolic parameters are indeterminate.  2. Right ventricular systolic function is normal. The right ventricular size is normal.  3. The mitral valve is normal in structure. Trivial mitral valve regurgitation.  4. The aortic valve is tricuspid. Aortic valve  regurgitation is trivial. Mild aortic valve sclerosis is present, with no evidence of aortic valve stenosis.  5. Aortic dilatation noted. There is dilatation of the ascending aorta measuring 42 mm. FINDINGS  Left Ventricle: Left ventricular ejection fraction, by estimation, is 60 to 65%. The left ventricle has normal function. The left ventricle has no regional wall motion abnormalities. The left ventricular internal cavity size was normal in size. There is  moderate asymmetric left ventricular hypertrophy of the basal-septal segment. Left ventricular diastolic parameters are indeterminate. Right Ventricle: The right ventricular size is normal. Right vetricular wall thickness was not assessed. Right ventricular systolic function is normal. Left Atrium: Left atrial size was normal in size. Right Atrium: Right atrial size was normal in size. Pericardium: Trivial pericardial effusion is present. Mitral Valve: The mitral valve is normal in structure. Trivial mitral valve regurgitation. MV peak gradient, 4.9 mmHg. The mean mitral valve gradient is 2.0 mmHg. Tricuspid Valve: The tricuspid valve is normal in structure. Tricuspid valve regurgitation is mild. Aortic Valve: The aortic valve is tricuspid. Aortic valve regurgitation is trivial. Mild aortic valve sclerosis is present, with no evidence of aortic valve stenosis. Aortic valve mean gradient measures 6.0 mmHg. Aortic valve peak gradient measures 12.7 mmHg. Aortic valve area, by VTI measures 2.44 cm. Pulmonic Valve: The pulmonic valve was not well visualized. Pulmonic valve regurgitation is mild. Aorta: Aortic dilatation noted. There is dilatation of the ascending aorta measuring 42 mm. IAS/Shunts: The interatrial septum was not well visualized. Additional Comments: A pacer wire is visualized.  LEFT VENTRICLE PLAX 2D LVIDd:         3.89 cm  Diastology LVIDs:  2.52 cm  LV e' lateral:   6.20 cm/s LV PW:         1.44 cm  LV E/e' lateral: 12.8 LV IVS:        1.78  cm  LV e' medial:    5.66 cm/s LVOT diam:     2.00 cm  LV E/e' medial:  14.0 LV SV:         79 LV SV Index:   39 LVOT Area:     3.14 cm  RIGHT VENTRICLE RV Basal diam:  3.51 cm RV S prime:     16.10 cm/s TAPSE (M-mode): 2.6 cm LEFT ATRIUM             Index       RIGHT ATRIUM           Index LA diam:        3.50 cm 1.75 cm/m  RA Area:     18.60 cm LA Vol (A2C):   66.8 ml 33.40 ml/m RA Volume:   47.20 ml  23.60 ml/m LA Vol (A4C):   58.3 ml 29.15 ml/m LA Biplane Vol: 63.0 ml 31.50 ml/m  AORTIC VALVE AV Area (Vmax):    2.49 cm AV Area (Vmean):   2.48 cm AV Area (VTI):     2.44 cm AV Vmax:           178.00 cm/s AV Vmean:          113.000 cm/s AV VTI:            0.322 m AV Peak Grad:      12.7 mmHg AV Mean Grad:      6.0 mmHg LVOT Vmax:         141.00 cm/s LVOT Vmean:        89.100 cm/s LVOT VTI:          0.250 m LVOT/AV VTI ratio: 0.78  AORTA Ao Root diam: 3.70 cm Ao Asc diam:  4.20 cm MITRAL VALVE                TRICUSPID VALVE MV Area (PHT): 2.87 cm     TR Peak grad:   40.4 mmHg MV Peak grad:  4.9 mmHg     TR Vmax:        318.00 cm/s MV Mean grad:  2.0 mmHg MV Vmax:       1.11 m/s     SHUNTS MV Vmean:      57.4 cm/s    Systemic VTI:  0.25 m MV Decel Time: 264 msec     Systemic Diam: 2.00 cm MV E velocity: 79.50 cm/s MV A velocity: 113.00 cm/s MV E/A ratio:  0.70 Epifanio Lesches MD Electronically signed by Epifanio Lesches MD Signature Date/Time: 05/18/2019/8:23:32 PM    Final    IR US CHEST  Result Date: 05/20/2019 CLINICAL DATA:  CHF with suspicion of left effusion on recent radiography, shortness of breath EXAM: CHEST ULTRASOUND COMPARISON:  Radiograph from the previous day FINDINGS: Survey ultrasound of the left hemithorax was no significant pleural effusion. Abdominal ascites is noted. IMPRESSION: 1. No significant left pleural effusion.  Thoracentesis deferred. 2. Abdominal ascites Electronically Signed   By: Corlis Leak M.D.   On: 05/20/2019 15:22       The results of significant  diagnostics from this hospitalization (including imaging, microbiology, ancillary and laboratory) are listed below for reference.     Microbiology: Recent Results (from the past 240 hour(s))  Respiratory Panel by  RT PCR (Flu A&B, Covid) - Nasopharyngeal Swab     Status: None   Collection Time: 05/18/19  3:52 AM   Specimen: Nasopharyngeal Swab  Result Value Ref Range Status   SARS Coronavirus 2 by RT PCR NEGATIVE NEGATIVE Final    Comment: (NOTE) SARS-CoV-2 target nucleic acids are NOT DETECTED. The SARS-CoV-2 RNA is generally detectable in upper respiratoy specimens during the acute phase of infection. The lowest concentration of SARS-CoV-2 viral copies this assay can detect is 131 copies/mL. A negative result does not preclude SARS-Cov-2 infection and should not be used as the sole basis for treatment or other patient management decisions. A negative result may occur with  improper specimen collection/handling, submission of specimen other than nasopharyngeal swab, presence of viral mutation(s) within the areas targeted by this assay, and inadequate number of viral copies (<131 copies/mL). A negative result must be combined with clinical observations, patient history, and epidemiological information. The expected result is Negative. Fact Sheet for Patients:  https://www.moore.com/ Fact Sheet for Healthcare Providers:  https://www.young.biz/ This test is not yet ap proved or cleared by the [place] FDA and  has been authorized for detection and/or diagnosis of SARS-CoV-2 by FDA under an Emergency Use Authorization (EUA). This EUA will remain  in effect (meaning this test can be used) for the duration of the COVID-19 declaration under Section 564(b)(1) of the Act, 21 U.S.C. section 360bbb-3(b)(1), unless the authorization is terminated or revoked sooner.    Influenza A by PCR NEGATIVE NEGATIVE Final   Influenza B by PCR NEGATIVE NEGATIVE  Final    Comment: (NOTE) The Xpert Xpress SARS-CoV-2/FLU/RSV assay is intended as an aid in  the diagnosis of influenza from Nasopharyngeal swab specimens and  should not be used as a sole basis for treatment. Nasal washings and  aspirates are unacceptable for Xpert Xpress SARS-CoV-2/FLU/RSV  testing. Fact Sheet for Patients: https://www.moore.com/ Fact Sheet for Healthcare Providers: https://www.young.biz/ This test is not yet approved or cleared by the [place] FDA and  has been authorized for detection and/or diagnosis of SARS-CoV-2 by  FDA under an Emergency Use Authorization (EUA). This EUA will remain  in effect (meaning this test can be used) for the duration of the  Covid-19 declaration under Section 564(b)(1) of the Act, 21  U.S.C. section 360bbb-3(b)(1), unless the authorization is  terminated or revoked. Performed at [facility], [address] [place], [place] [address]      Labs: BNP (last 3 results) Recent Labs    05/18/19 0423  BNP 247.7*   Basic Metabolic Panel: Recent Labs  Lab 05/18/19 0423 05/19/19 0536 05/20/19 0834 05/21/19 0709  NA 134* 136 134* 133*  K 4.9 3.4* 3.9 3.7  CL 100 97* 94* 90*  CO2 24 32 31 33*  GLUCOSE 97 91 100* 113*  BUN CREATININE 1.10* 1.26* 1.31* 1.27*  CALCIUM 8.1* 7.9* 8.1* 7.9*  MG  --   --  1.6* 1.9  PHOS  --   --   --  3.2   Liver Function Tests: Recent Labs  Lab 05/21/19 0709  ALBUMIN 2.4*   No results for input(s): LIPASE, AMYLASE in the last 168 hours. No results for input(s): AMMONIA in the last 168 hours. CBC: Recent Labs  Lab 05/18/19 0423 05/19/19 0536 05/20/19 0834  WBC 6.6 6.8 9.2  NEUTROABS 4.4  --  6.1  HGB 11.2* 10.6* 11.1*  HCT 36.6 33.6* 35.3*  MCV 90.8 89.6 91.2  PLT  266 249 285   Cardiac Enzymes: No results for input(s): CKTOTAL, CKMB, CKMBINDEX, TROPONINI in the last 168 hours. BNP: Invalid input(s): POCBNP CBG: No  results for input(s): GLUCAP in the last 168 hours. D-Dimer No results for input(s): DDIMER in the last 72 hours. Hgb A1c No results for input(s): HGBA1C in the last 72 hours. Lipid Profile No results for input(s): CHOL, HDL, LDLCALC, TRIG, CHOLHDL, LDLDIRECT in the last 72 hours. Thyroid function studies No results for input(s): TSH, T4TOTAL, T3FREE, THYROIDAB in the last 72 hours.  Invalid input(s): FREET3 Anemia work up No results for input(s): VITAMINB12, FOLATE, FERRITIN, TIBC, IRON, RETICCTPCT in the last 72 hours. Urinalysis No results found for: COLORURINE, APPEARANCEUR, LABSPEC, PHURINE, GLUCOSEU, HGBUR, BILIRUBINUR, KETONESUR, PROTEINUR, UROBILINOGEN, NITRITE, LEUKOCYTESUR Sepsis Labs Invalid input(s): PROCALCITONIN,  WBC,  LACTICIDVEN   Time coordinating discharge: 45 minutes  SIGNED:  Almon Hercules, MD  [facility] 05/21/2019, 4:15 PM  If 7PM-7AM, please contact night-coverage [email or web address] Password [email or web address]

## 2019-05-21 NOTE — Progress Notes (Signed)
Occupational Therapy Treatment Patient Details Name: Felicia Benson MRN: [id number] DOB: 1930-09-18 Today's Date: 05/21/2019    History of present illness Pt is an 86 y/o female admitted secondary to worsening SOB and chest pain thought to be secondary to CHF. PMH includes HTN, COPD on oxygen PRN, AAA, s/p pacemaker, and CHF.    OT comments  Pt making progress in therapy, demonstrating improved independence and balance with ADLs and mobility tasks. Upon OT arrival, pt on room air with SpO2 84%. Donned 2L [place] with SpO2 increasing to 93% following ~1 min seated rest break. Continued instruction with pt on pursed lip breathing strategies with good understanding and follow through. Pt able to ambulate around room with supervision and without use of an AD. 0 instances of LOB. Educated/instructed pt on walk-in shower transfer with pt able to complete with supervision demonstrating good balance and safety. Fair recall of previously learned fall prevention and energy conservation strategies with continued education provided. OT will continue to follow acutely.    Follow Up Recommendations  No OT follow up    Equipment Recommendations  None recommended by OT    Recommendations for Other Services      Precautions / Restrictions Precautions Precautions: Fall;Other (comment) Precaution Comments: Watch O2 Restrictions Weight Bearing Restrictions: No       Mobility Bed Mobility               General bed mobility comments: Pt seated EOB upon OT arrival  Transfers Overall transfer level: Independent Equipment used: None Transfers: Sit to/from Stand Sit to Stand: Independent         General transfer comment: good balance and safety    Balance Overall balance assessment: Mild deficits observed, not formally tested                                         ADL either performed or assessed with clinical judgement   ADL Overall ADL's : Needs assistance/impaired                                 Tub/ Shower Transfer: Supervision/safety;Ambulation;Grab bars;Walk-in shower Tub/Shower Transfer Details (indicate cue type and reason): 0 instances of LOB.  Functional mobility during ADLs: Supervision/safety General ADL Comments: Pt able to ambulate around room with supervision. 0 instances of LOB. Pt completed walk-in shower transfer with supervision with good balance and safety.      Vision       Perception     Praxis      Cognition Arousal/Alertness: Awake/alert Behavior During Therapy: WFL for tasks assessed/performed Overall Cognitive Status: Within Functional Limits for tasks assessed                                          Exercises Exercises: Other exercises Other Exercises Other Exercises: Encouraged pursed lip breathing throughout.    Shoulder Instructions       General Comments Pt on room air upon OT arrival with SpO2 84%. Donned 2L [place] with SpO2 increasing to 93% following ~1 min seated rest break.     Pertinent Vitals/ Pain       Pain Assessment: No/denies pain  Home Living  Prior Functioning/Environment              Frequency           Progress Toward Goals  OT Goals(current goals can now be found in the care plan section)  Progress towards OT goals: Progressing toward goals  ADL Goals Pt Will Perform Tub/Shower Transfer: Shower transfer;with modified independence;shower seat Additional ADL Goal #1: Pt to complete all ADLs with modified independence, demonstrating safe management of O2 tubing. Additional ADL Goal #2: Pt to complete higher level IADLs (i.e. bed making, item retrieval) with modified independence, demonstrating safe management of O2 tubing. Additional ADL Goal #3: Pt to recall and verbalize 3 energy conservation strategies with 0 verbal cues. Additional ADL Goal #4: Pt to tolerate standing up to 5 min with  modified independence, in preparation for ADLs.  Plan Discharge plan remains appropriate    Co-evaluation                 AM-PAC OT "6 Clicks" Daily Activity     Outcome Measure   Help from another person eating meals?: None Help from another person taking care of personal grooming?: A Little Help from another person toileting, which includes using toliet, bedpan, or urinal?: A Little Help from another person bathing (including washing, rinsing, drying)?: A Little Help from another person to put on and taking off regular upper body clothing?: None Help from another person to put on and taking off regular lower body clothing?: A Little 6 Click Score: 20    End of Session Equipment Utilized During Treatment: Oxygen  OT Visit Diagnosis: Unsteadiness on feet (R26.81);Other (comment)(decreased activity tolerance)   Activity Tolerance Patient tolerated treatment well   Patient Left Other (comment);with call bell/phone within reach(Seated EOB)   Nurse Communication Mobility status        Time: 5681-2751 OT Time Calculation (min): 17 min  Charges: OT General Charges $OT Visit: 1 Visit OT Treatments $Therapeutic Activity: 8-22 mins  Mauri Brooklyn OTR/L [phone]   Mauri Brooklyn 05/21/2019, 1:49 PM

## 2020-06-08 LAB — PULMONARY FUNCTION TEST

## 2020-08-18 ENCOUNTER — Other Ambulatory Visit: Payer: Self-pay | Admitting: Gastroenterology

## 2020-08-18 DIAGNOSIS — K862 Cyst of pancreas: Secondary | ICD-10-CM

## 2020-09-01 ENCOUNTER — Other Ambulatory Visit (HOSPITAL_COMMUNITY): Payer: Self-pay | Admitting: Gastroenterology

## 2020-09-01 DIAGNOSIS — K862 Cyst of pancreas: Secondary | ICD-10-CM

## 2020-10-14 ENCOUNTER — Other Ambulatory Visit (HOSPITAL_COMMUNITY): Payer: Self-pay | Admitting: Gastroenterology

## 2020-10-14 DIAGNOSIS — K5000 Crohn's disease of small intestine without complications: Secondary | ICD-10-CM

## 2020-10-14 DIAGNOSIS — R1013 Epigastric pain: Secondary | ICD-10-CM

## 2020-10-18 ENCOUNTER — Other Ambulatory Visit: Payer: Self-pay

## 2020-10-18 ENCOUNTER — Encounter (HOSPITAL_COMMUNITY): Payer: Self-pay

## 2020-10-18 ENCOUNTER — Ambulatory Visit (HOSPITAL_COMMUNITY)
Admission: RE | Admit: 2020-10-18 | Discharge: 2020-10-18 | Disposition: A | Discharge status: Home / Self Care | Payer: Medicare Other | Source: Ambulatory Visit | Attending: Gastroenterology | Admitting: Gastroenterology

## 2020-10-18 DIAGNOSIS — R1013 Epigastric pain: Secondary | ICD-10-CM | POA: Diagnosis present

## 2020-10-18 DIAGNOSIS — K5000 Crohn's disease of small intestine without complications: Secondary | ICD-10-CM | POA: Insufficient documentation

## 2020-10-18 LAB — POCT I-STAT CREATININE: Creatinine, Ser: 0.9 mg/dL (ref 0.44–1.00)

## 2020-10-18 IMAGING — CT CT ABD-PELV W/ CM
2 of 5 series · 16 of 46 positions shown, 18 images · IV contrast (OMNIPAQUE)
Comparison: None.

CLINICAL DATA: Upper abdominal pain, indigestion, nausea, history
of Crohn's disease, history of pancreatic cyst

EXAM:
CT ABDOMEN AND PELVIS WITH CONTRAST
TECHNIQUE: Multidetector CT imaging of the abdomen and pelvis was performed
using the standard protocol following bolus administration of
intravenous contrast.
CONTRAST:  80mL OMNIPAQUE IOHEXOL 350 MG/ML SOLN, additional oral
enteric contrast

[Series 2: axial st · axial · 0.76mm/px · z∈[-472,-118]mm · 13 of 83 slices shown, 15 images]
[im 6/83  soft-tissue]
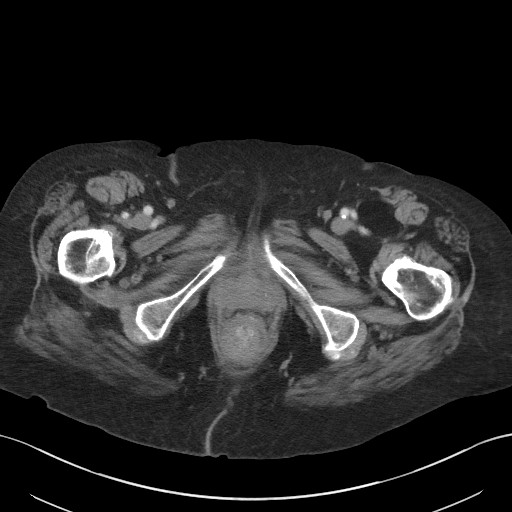
[im 6/83  bone]
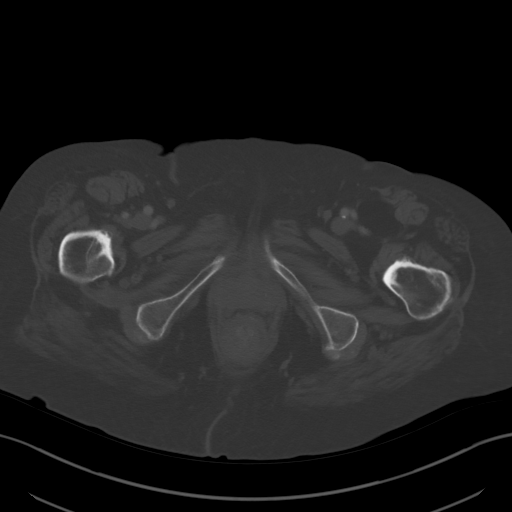
[im 11/83  soft-tissue]
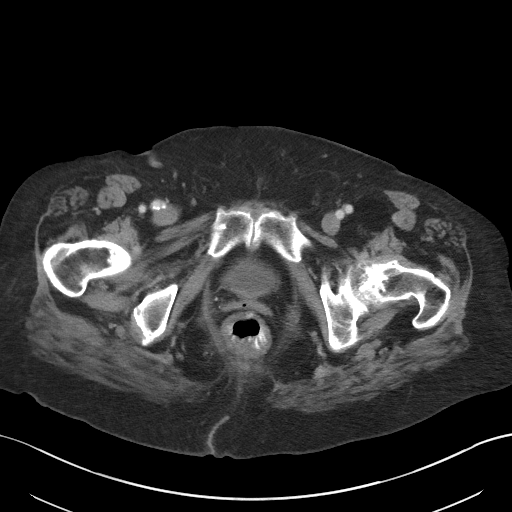
[im 16/83  soft-tissue]
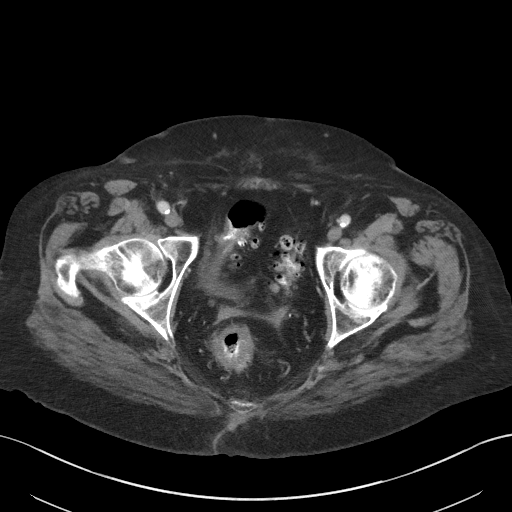
[im 26/83  soft-tissue]
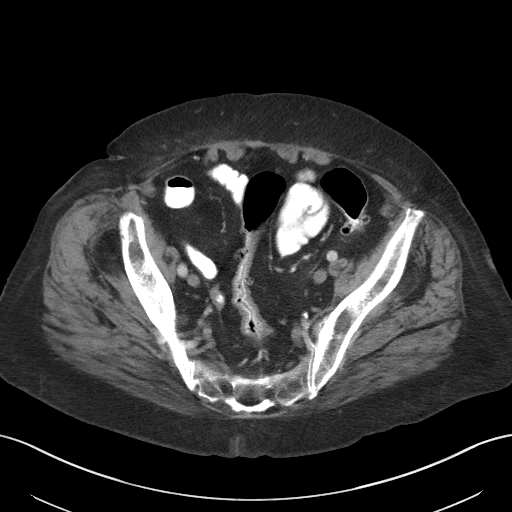
[im 31/83  soft-tissue]
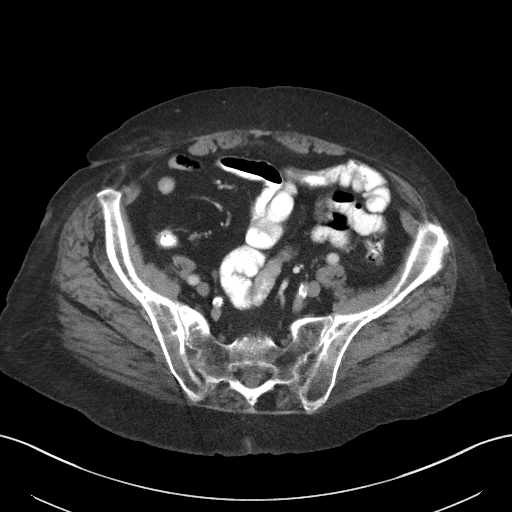
[im 36/83  soft-tissue]
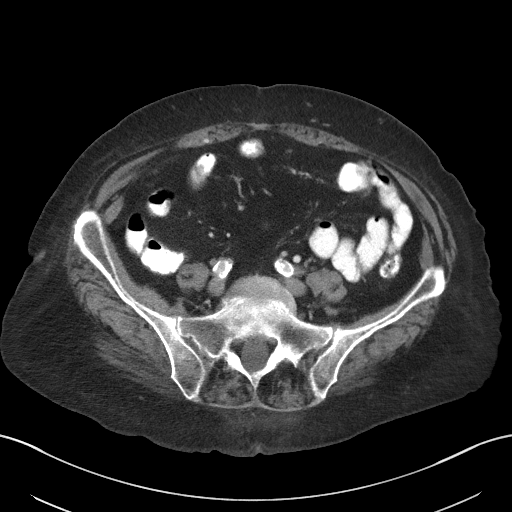
[im 42/83  soft-tissue]
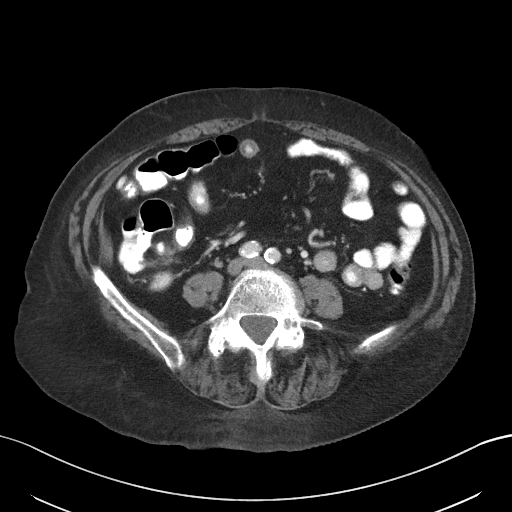
[im 47/83  soft-tissue]
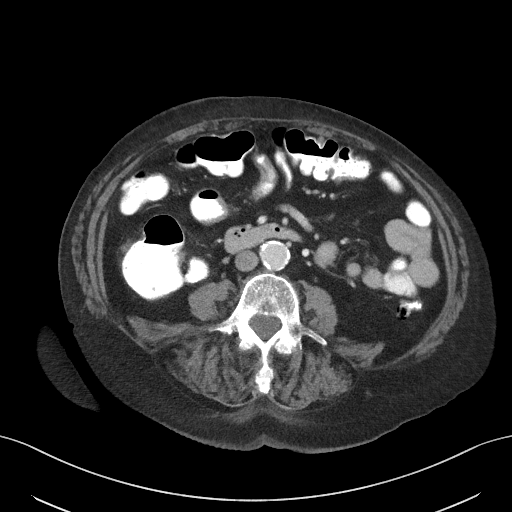
[im 52/83  soft-tissue]
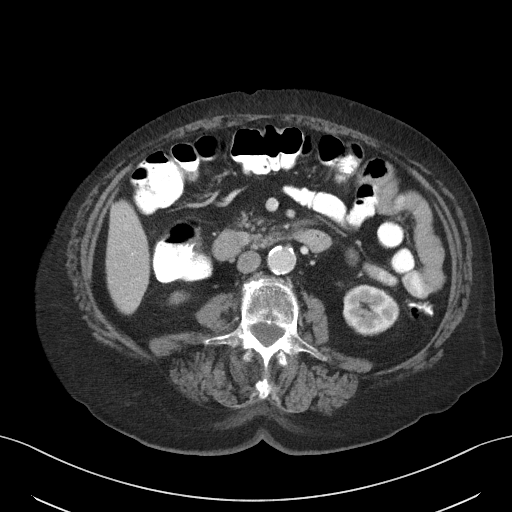
[im 52/83  bone]
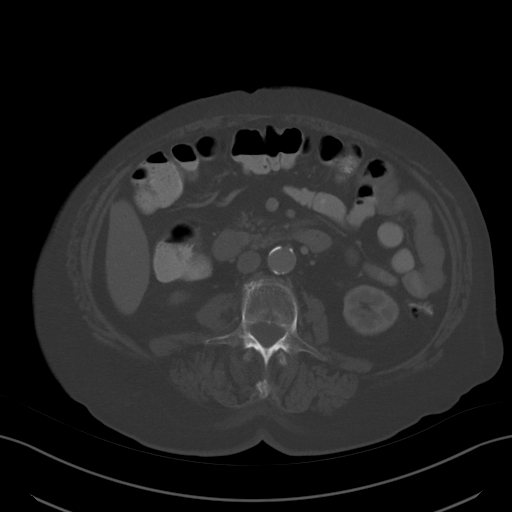
[im 57/83  soft-tissue]
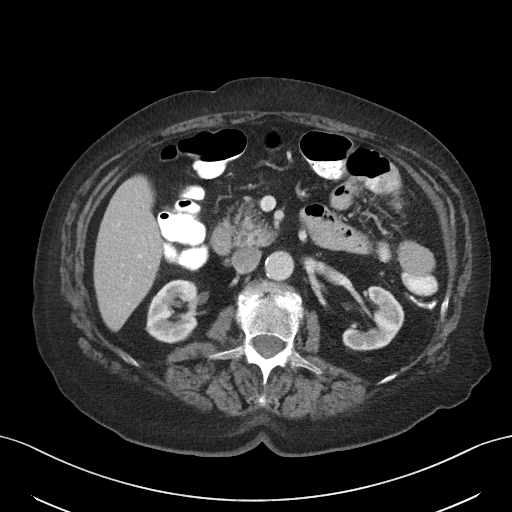
[im 67/83  soft-tissue]
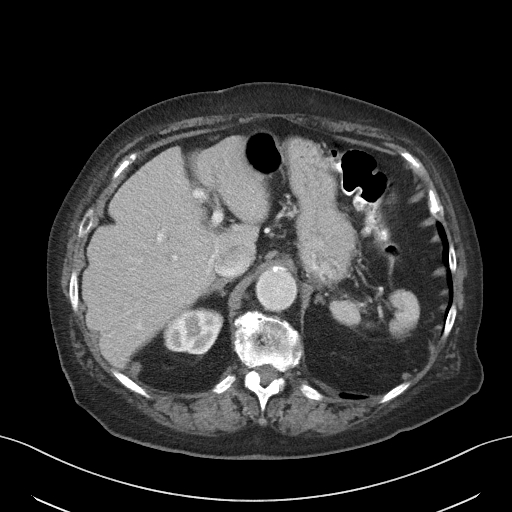
[im 72/83  soft-tissue]
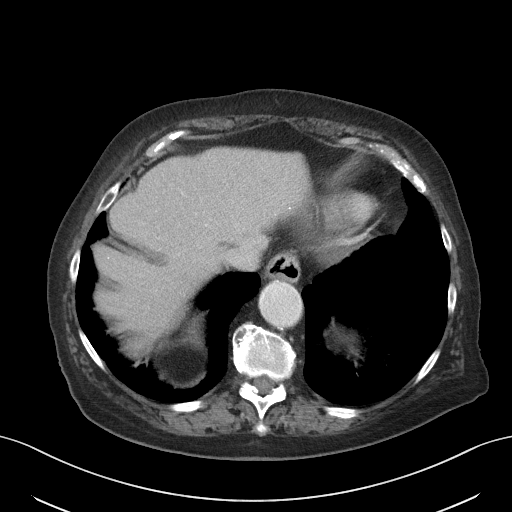
[im 77/83  soft-tissue]
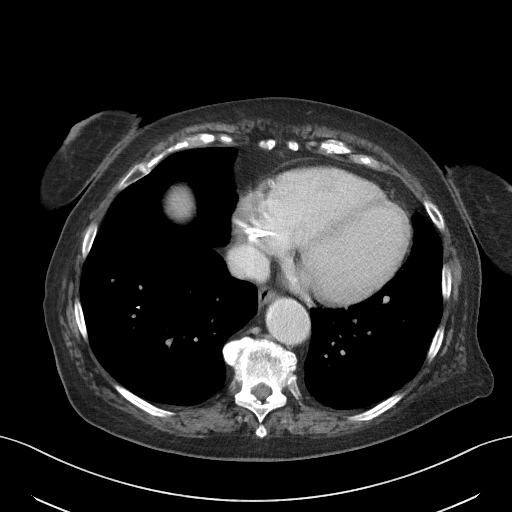

[Series 4: coronal st · coronal · 0.75mm/px · 3 of 90 slices shown]
[im 30/90  soft-tissue]
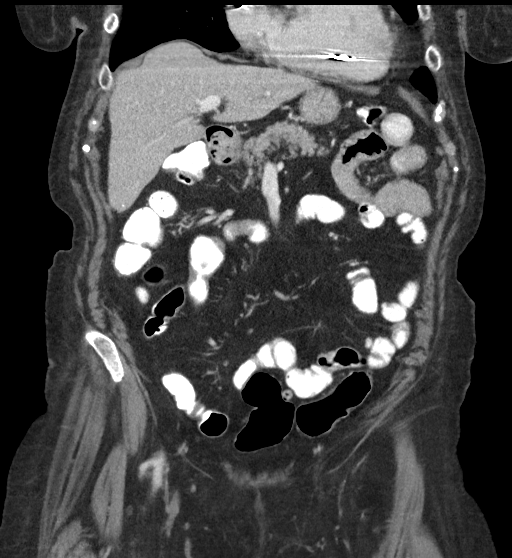
[im 40/90  soft-tissue]
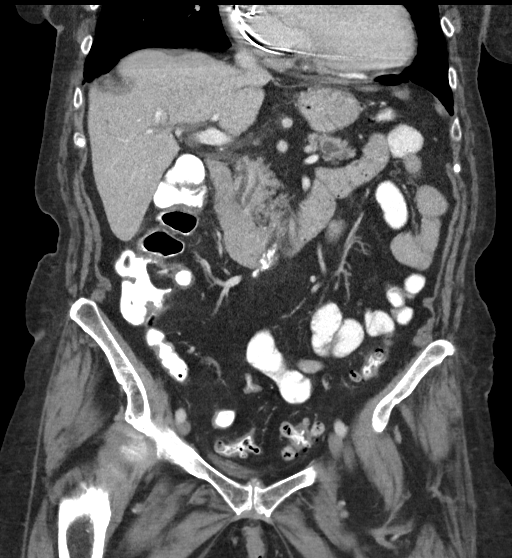
[im 50/90  soft-tissue]
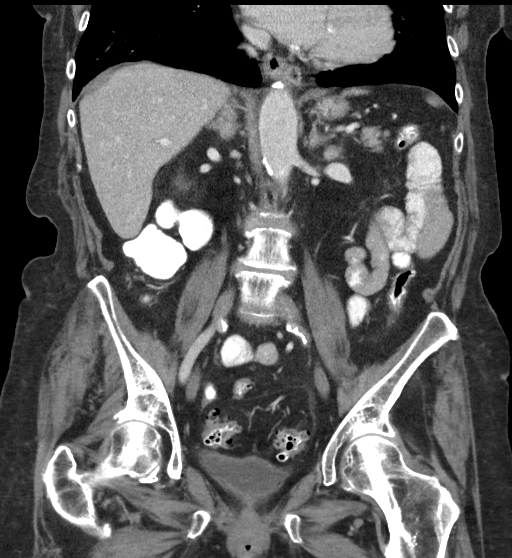

[16 of 46 positions shown; findings below may reference images not displayed]

FINDINGS: Lower chest: No acute abnormality.  Small hiatal hernia.

Hepatobiliary: No focal liver abnormality is seen. Status post
cholecystectomy. No biliary dilatation.

Pancreas: There is a fluid attenuation cystic lesion of the
pancreatic body measuring 1.4 cm (series 2, image 22). No pancreatic
ductal dilatation or surrounding inflammatory changes.

Spleen: Normal in size without significant abnormality.

Adrenals/Urinary Tract: Adrenal glands are unremarkable. Kidneys are
normal, without renal calculi, solid lesion, or hydronephrosis.
Bladder is unremarkable.

Stomach/Bowel: Stomach is within normal limits. Appendix is not
clearly visualized and may be surgically absent. No evidence of
bowel wall thickening, distention, or inflammatory changes.
Descending and sigmoid diverticulosis.

Vascular/Lymphatic: Aortic atherosclerosis. No enlarged abdominal or
pelvic lymph nodes.

Reproductive: Status post hysterectomy.

Other: No abdominal wall hernia or abnormality. No abdominopelvic
ascites.

Musculoskeletal: No acute or significant osseous findings.
IMPRESSION: 1. No acute CT findings of the abdomen or pelvis to explain upper
abdominal pain.
2. There is a fluid attenuation cystic lesion of the pancreatic body
measuring 1.4 cm. As there is no increased risk of malignancy
observed for such lesions smaller than 2 cm, no further follow-up or
characterization is required.
3. Descending and sigmoid diverticulosis without evidence of acute
diverticulitis.
4. Small hiatal hernia.

Aortic Atherosclerosis ([OS]-[OS]).

## 2020-10-18 MED ORDER — IOHEXOL 350 MG/ML SOLN
80.0000 mL | Freq: Once | INTRAVENOUS | Status: AC | PRN
  Administered 2020-10-18: 80 mL via INTRAVENOUS

## 2020-10-26 ENCOUNTER — Encounter (HOSPITAL_COMMUNITY): Payer: Self-pay

## 2020-10-26 ENCOUNTER — Ambulatory Visit (HOSPITAL_COMMUNITY): Payer: Medicare Other

## 2020-12-30 ENCOUNTER — Other Ambulatory Visit: Payer: Self-pay

## 2020-12-30 ENCOUNTER — Encounter (HOSPITAL_COMMUNITY): Payer: Self-pay | Admitting: Emergency Medicine

## 2020-12-30 ENCOUNTER — Observation Stay (HOSPITAL_COMMUNITY)
Admission: EM | Admit: 2020-12-30 | Discharge: 2020-12-31 | Disposition: A | Discharge status: Home / Self Care | Payer: Medicare Other | Attending: Internal Medicine | Admitting: Internal Medicine

## 2020-12-30 DIAGNOSIS — I5033 Acute on chronic diastolic (congestive) heart failure: Secondary | ICD-10-CM | POA: Insufficient documentation

## 2020-12-30 DIAGNOSIS — Z950 Presence of cardiac pacemaker: Secondary | ICD-10-CM | POA: Diagnosis not present

## 2020-12-30 DIAGNOSIS — I714 Abdominal aortic aneurysm, without rupture, unspecified: Secondary | ICD-10-CM | POA: Diagnosis present

## 2020-12-30 DIAGNOSIS — G459 Transient cerebral ischemic attack, unspecified: Secondary | ICD-10-CM | POA: Diagnosis not present

## 2020-12-30 DIAGNOSIS — J449 Chronic obstructive pulmonary disease, unspecified: Secondary | ICD-10-CM | POA: Diagnosis not present

## 2020-12-30 DIAGNOSIS — N390 Urinary tract infection, site not specified: Secondary | ICD-10-CM

## 2020-12-30 DIAGNOSIS — G4733 Obstructive sleep apnea (adult) (pediatric): Secondary | ICD-10-CM | POA: Diagnosis not present

## 2020-12-30 DIAGNOSIS — I110 Hypertensive heart disease with heart failure: Secondary | ICD-10-CM | POA: Diagnosis not present

## 2020-12-30 DIAGNOSIS — I10 Essential (primary) hypertension: Secondary | ICD-10-CM | POA: Diagnosis present

## 2020-12-30 DIAGNOSIS — Z87891 Personal history of nicotine dependence: Secondary | ICD-10-CM | POA: Insufficient documentation

## 2020-12-30 DIAGNOSIS — I2510 Atherosclerotic heart disease of native coronary artery without angina pectoris: Secondary | ICD-10-CM | POA: Diagnosis not present

## 2020-12-30 DIAGNOSIS — Z79899 Other long term (current) drug therapy: Secondary | ICD-10-CM | POA: Diagnosis not present

## 2020-12-30 DIAGNOSIS — R4701 Aphasia: Secondary | ICD-10-CM | POA: Diagnosis present

## 2020-12-30 DIAGNOSIS — H3530 Unspecified macular degeneration: Secondary | ICD-10-CM

## 2020-12-30 DIAGNOSIS — K5090 Crohn's disease, unspecified, without complications: Secondary | ICD-10-CM | POA: Diagnosis present

## 2020-12-30 DIAGNOSIS — Z20822 Contact with and (suspected) exposure to covid-19: Secondary | ICD-10-CM | POA: Insufficient documentation

## 2020-12-30 DIAGNOSIS — J441 Chronic obstructive pulmonary disease with (acute) exacerbation: Secondary | ICD-10-CM | POA: Diagnosis present

## 2020-12-30 DIAGNOSIS — Z7982 Long term (current) use of aspirin: Secondary | ICD-10-CM | POA: Insufficient documentation

## 2020-12-30 DIAGNOSIS — R479 Unspecified speech disturbances: Secondary | ICD-10-CM

## 2020-12-30 NOTE — ED Triage Notes (Signed)
Pt BIB [facility] from home, at 1830 pt and daughter noticed that she was having difficulty with her words, as well as slurred speech. On EMS arrival, pt speaking clearly. No other neuro deficits noted, ambulatory at home.

## 2020-12-30 NOTE — ED Notes (Signed)
Pt currently complaining of a 5/10 headache.

## 2020-12-30 NOTE — ED Provider Notes (Signed)
[facility] EMERGENCY DEPARTMENT Provider Note   CSN: [id number] Arrival date & time: 12/30/20  1957     History Chief Complaint  Patient presents with   Aphasia    Felicia Benson is a 87 y.o. female.  Presenting to the emergency room with concern for episode of aphasia.  Patient states around 630 while she was getting ready to make cheesecake with her daughter she suddenly had difficulty speaking.  Described as word finding difficulty, knew what she wanted to say but could not get the right words out.  Daughter witnessed, corroborated history.  They estimate speech difficulty lasted 30 to 45 minutes in total.  By the time EMS arrived patient speech was improving.  She currently denies any neurologic complaint.  Later said that she does have mild left-sided headache.  She denies any difficulty with vision, no numbness or weakness.    HPI     Past Medical History:  Diagnosis Date   AAA (abdominal aortic aneurysm)    CHF (congestive heart failure) (HCC)    Crohn's disease (HCC)    Diverticulitis    Diverticulosis    GERD (gastroesophageal reflux disease)    GI bleed    Hypertension    OSA (obstructive sleep apnea)    Pacemaker    Thyroid nodule     Patient Active Problem List   Diagnosis Date Noted   Acute on chronic diastolic (congestive) heart failure (HCC) 05/19/2019   Acute CHF (congestive heart failure) (HCC) 05/18/2019   Acute on chronic respiratory failure with hypoxia (HCC) 05/18/2019   COPD without exacerbation (HCC) 05/18/2019   Pacemaker 05/18/2019   AAA (abdominal aortic aneurysm) 05/18/2019   Hyperlipidemia 05/18/2019   Abdominal pain in female 05/30/2015   Hypertension 05/30/2015   Crohn disease (HCC) 05/30/2015   Gastrointestinal hemorrhage with melena 05/30/2015   Blood loss anemia 05/30/2015   Hyponatremia 05/30/2015   Kidney disease 05/30/2015   Anemia 05/30/2015   CAD (coronary artery disease), native coronary artery  05/30/2015   Acute blood loss anemia 05/30/2015   Heme + stool     Past Surgical History:  Procedure Laterality Date   CHOLECYSTECTOMY     COLONOSCOPY WITH PROPOFOL N/A 06/01/2015   Procedure: COLONOSCOPY WITH PROPOFOL;  Surgeon: Charlott Rakes, MD;  Location: [facility] ENDOSCOPY;  Service: Endoscopy;  Laterality: N/A;   ESOPHAGOGASTRODUODENOSCOPY (EGD) WITH PROPOFOL N/A 06/01/2015   Procedure: ESOPHAGOGASTRODUODENOSCOPY (EGD) WITH PROPOFOL;  Surgeon: Charlott Rakes, MD;  Location: [facility] ENDOSCOPY;  Service: Endoscopy;  Laterality: N/A;   GIVENS CAPSULE STUDY N/A 06/03/2015   Procedure: GIVENS CAPSULE STUDY;  Surgeon: Charlott Rakes, MD;  Location: [facility] ENDOSCOPY;  Service: Endoscopy;  Laterality: N/A;   PACEMAKER PLACEMENT  2015     OB History   No obstetric history on file.     Family History  Problem Relation Age of Onset   CVA Mother    Lung cancer Father    Colon cancer Neg Hx     Social History   Tobacco Use   Smoking status: Former   Smokeless tobacco: Never  Building services engineer Use: Never used  Substance Use Topics   Alcohol use: No    Comment: Quit in 1985   Drug use: No    Home Medications Prior to Admission medications   Medication Sig Start Date End Date Taking? Authorizing Provider  acetaminophen (TYLENOL) 325 MG tablet Take 650 mg by mouth 2 (two) times daily as needed for mild pain or headache.  Yes [provider]  amLODipine (NORVASC) 10 MG tablet Take 1 tablet (10 mg total) by mouth every evening. Hold for next few days 06/04/15  Yes Rai, Ripudeep K, MD  aspirin 81 MG EC tablet Take 1 tablet by mouth daily. 10/18/15  Yes [provider]  atorvastatin (LIPITOR) 10 MG tablet Take 10 mg by mouth daily. 03/07/18  Yes [provider]  Cholecalciferol (VITAMIN D3) 5000 units TABS Take 5,000 Units by mouth daily.    Yes [provider]  cloNIDine (CATAPRES - DOSED IN MG/24 HR) 0.2 mg/24hr patch Place 0.2 mg onto the skin once a  week. 12/14/20  Yes [provider]  furosemide (LASIX) 40 MG tablet Take 1 tablet (40 mg total) by mouth daily. May take additional 1 tablet (40 mg) for more swelling, shortness of breath or over 2 lbs weight gain 05/22/19  Yes Gonfa, Boyce Medici, MD  hydroxypropyl methylcellulose / hypromellose (ISOPTO TEARS / GONIOVISC) 2.5 % ophthalmic solution Place 1 drop into both eyes 2 (two) times daily as needed for dry eyes.   Yes [provider]  losartan (COZAAR) 25 MG tablet Take 1 tablet (25 mg total) by mouth daily. 05/21/19  Yes Almon Hercules, MD  mesalamine (PENTASA) 500 MG CR capsule Take 1,500 mg by mouth 2 (two) times daily.   Yes [provider]  metoprolol succinate (TOPROL-XL) 25 MG 24 hr tablet Take 75 mg by mouth 2 (two) times daily. 04/23/15  Yes [provider]  mometasone-formoterol (DULERA) 100-5 MCG/ACT AERO Inhale 2 puffs into the lungs 2 (two) times daily at 8 am and 10 pm.   Yes [provider]  montelukast (SINGULAIR) 10 MG tablet Take 10 mg by mouth daily. 04/05/15  Yes [provider]  multivitamin-lutein (OCUVITE-LUTEIN) CAPS capsule Take 1 capsule by mouth daily.   Yes [provider]  nitroGLYCERIN (NITROSTAT) 0.3 MG SL tablet Place 0.3 mg under the tongue every 5 (five) hours as needed for chest pain.   Yes [provider]  pantoprazole (PROTONIX) 40 MG tablet Take 40 mg by mouth daily. 11/21/20  Yes [provider]  SYMBICORT 160-4.5 MCG/ACT inhaler Inhale 2 puffs into the lungs in the morning and at bedtime. 05/15/19  Yes [provider]  traMADol (ULTRAM) 50 MG tablet Take 50-100 mg by mouth 2 (two) times daily as needed for moderate pain. 11/14/20  Yes [provider]  triamcinolone cream (KENALOG) 0.1 % Apply 1 application topically 2 (two) times daily as needed (yeast infection). Apply to skin under breast tissue   Yes [provider]  esomeprazole (NEXIUM) 40 MG capsule Take 40  mg by mouth daily. Patient not taking: Reported on 12/30/2020 03/21/19   [provider]    Allergies    Nsaids  Review of Systems   Review of Systems  Constitutional:  Negative for chills and fever.  HENT:  Negative for ear pain and sore throat.   Eyes:  Negative for pain and visual disturbance.  Respiratory:  Negative for cough and shortness of breath.   Cardiovascular:  Negative for chest pain and palpitations.  Gastrointestinal:  Negative for abdominal pain and vomiting.  Genitourinary:  Negative for dysuria and hematuria.  Musculoskeletal:  Negative for arthralgias and back pain.  Skin:  Negative for color change and rash.  Neurological:  Positive for speech difficulty. Negative for seizures and syncope.  All other systems reviewed and are negative.  Physical Exam Updated Vital Signs BP (!) 157/72  Pulse 60    Temp 97.9 F (36.6 C) (Oral)    Resp 20    SpO2 98%   Physical Exam Vitals and nursing note reviewed.  Constitutional:      General: She is not in acute distress.    Appearance: She is well-developed.  HENT:     Head: Normocephalic and atraumatic.  Eyes:     Conjunctiva/sclera: Conjunctivae normal.  Cardiovascular:     Rate and Rhythm: Normal rate and regular rhythm.     Heart sounds: No murmur heard. Pulmonary:     Effort: Pulmonary effort is normal. No respiratory distress.     Breath sounds: Normal breath sounds.  Abdominal:     Palpations: Abdomen is soft.     Tenderness: There is no abdominal tenderness.  Musculoskeletal:        General: No swelling.     Cervical back: Neck supple.  Skin:    General: Skin is warm and dry.     Capillary Refill: Capillary refill takes less than 2 seconds.  Neurological:     Mental Status: She is alert.     Comments: AAOx3 CN 2-12 intact, speech clear visual fields intact 5/5 strength in b/l UE and LE Sensation to light touch intact in b/l UE and LE Normal FNF  Psychiatric:        Mood and Affect: Mood  normal.    ED Results / Procedures / Treatments   Labs (all labs ordered are listed, but only abnormal results are displayed) Labs Reviewed  RESP PANEL BY RT-PCR (FLU A&B, COVID) [id number]  PROTIME-INR  CBC  DIFFERENTIAL  COMPREHENSIVE METABOLIC PANEL  RAPID URINE DRUG SCREEN, HOSP PERFORMED  URINALYSIS, ROUTINE W REFLEX MICROSCOPIC  I-STAT CHEM 8, ED    EKG EKG Interpretation  Date/Time:  Friday December 30 2020 21:16:53 EST Ventricular Rate:  62 PR Interval:  193 QRS Duration: 157 QT Interval:  486 QTC Calculation: [id number] Axis:   -82 Text Interpretation: Sinus rhythm Nonspecific IVCD with LAD Left ventricular hypertrophy Anterolateral infarct, age indeterminate Confirmed by Madalyn Rob [phone]) on 12/30/2020 10:51:59 PM  Radiology No results found.  Procedures Procedures   Medications Ordered in ED Medications - No data to display  ED Course  I have reviewed the triage vital signs and the nursing notes.  Pertinent labs & imaging results that were available during my care of the patient were reviewed by me and considered in my medical decision making (see chart for details).    MDM Rules/Calculators/A&P                         87 year old lady presents to ER with concern for speech difficulty.  On arrival to ER patient is neuro intact without any neurologic deficit or complaints.  History highly suspicious for aphasia, TIA.  Consulted neurology, Sal -he recommended checking CTA head and neck, admitting to medicine for stroke work-up.  He will see as consult.  Basic laboratory work, stroke labs ordered.  While awaiting basic labs and CT imaging, signed out to Dr. Leonette Monarch who will f/u these tests and then admit to medicine.  Final Clinical Impression(s) / ED Diagnoses Final diagnoses:  Transient speech disturbance  TIA (transient ischemic attack)    Rx / DC Orders ED Discharge Orders     None        Lucrezia Starch, MD 12/30/20 2358

## 2020-12-30 NOTE — Consult Note (Addendum)
NEUROLOGY CONSULTATION NOTE   Date of service: December 30, 2020 Patient Name: Felicia Benson MRN:  [id number] DOB:  [birth year] Reason for consult: "episode of aphasia that resolved" Requesting Provider: Lucrezia Starch, MD _ _ _   _ __   _ __ _ _  __ __   _ __   __ _  History of Present Illness  Felicia Benson is a 87 y.o. female with PMH significant for abdominal aortic aneurysm, CHF, CAD s/p RCA stenting, Crohn's disease, diverticulitis, hypertension, age-related macular degeneration, obstructive sleep apnea, heart block status post pacemaker placement who presents with an episode of aphasia that self resolved.  Patient reports that she was making some cookies and had her daughter to help her due to poor vision from her age-related macular degeneration when she had sudden onset garbled speech with word salad and nothing was making sense.  She was unable to instruct her daughter what to do next.  This went on for 30 to 45 minutes.  They called EMS and by the time EMS arrived, symptoms were improving and resolved before she came into the ED.  No prior history of strokes, no history of diabetes, no history of hyperlipidemia.  She was a smoker in the past but quit several years ago.  No family history of strokes.  Reports a history of heart block status post pacemaker placement but no prior history of atrial fibrillation.  She reports a mild headache on the left that started in the ED.  MRIs: 0 LK W: 1900 on 12/30/2020. TNKase/thrombectomy: Not offered due to resolution of her symptoms and NIH stroke scale 0.  NIHSS components Score: Comment  1a Level of Conscious 0[x]  1[]  2[]  3[]      1b LOC Questions 0[x]  1[]  2[]       1c LOC Commands 0[x]  1[]  2[]       2 Best Gaze 0[x]  1[]  2[]       3 Visual 0[x]  1[]  2[]  3[]      4 Facial Palsy 0[x]  1[]  2[]  3[]      5a Motor Arm - left 0[x]  1[]  2[]  3[]  4[]  UN[]    5b Motor Arm - Right 0[x]  1[]  2[]  3[]  4[]  UN[]    6a Motor Leg - Left 0[x]  1[]  2[]  3[]  4[]   UN[]    6b Motor Leg - Right 0[x]  1[]  2[]  3[]  4[]  UN[]    7 Limb Ataxia 0[x]  1[]  2[]  3[]  UN[]     8 Sensory 0[x]  1[]  2[]  UN[]      9 Best Language 0[x]  1[]  2[]  3[]      10 Dysarthria 0[x]  1[]  2[]  UN[]      11 Extinct. and Inattention 0[x]  1[]  2[]       TOTAL: 0     ROS   Constitutional Denies weight loss, fever and chills.   HEENT Denies changes in vision and hearing.   Respiratory Denies SOB and cough.   CV Denies palpitations and CP   GI Denies abdominal pain, nausea, vomiting and diarrhea.   GU Denies dysuria and urinary frequency.   MSK Denies myalgia and joint pain.   Skin Denies rash and pruritus.   Neurological endorses headache but no syncope.   Psychiatric Denies recent changes in mood. Denies anxiety and depression.    Past History   Past Medical History:  Diagnosis Date   AAA (abdominal aortic aneurysm)    CHF (congestive heart failure) (HCC)    Crohn's disease ([place])    Diverticulitis    Diverticulosis    GERD (gastroesophageal reflux disease)  GI bleed    Hypertension    OSA (obstructive sleep apnea)    Pacemaker    Thyroid nodule    Past Surgical History:  Procedure Laterality Date   CHOLECYSTECTOMY     COLONOSCOPY WITH PROPOFOL N/A 06/01/2015   Procedure: COLONOSCOPY WITH PROPOFOL;  Surgeon: Wilford Corner, MD;  Location[facility] ENDOSCOPY;  Service: Endoscopy;  Laterality: N/A;   ESOPHAGOGASTRODUODENOSCOPY (EGD) WITH PROPOFOL N/A 06/01/2015   Procedure: ESOPHAGOGASTRODUODENOSCOPY (EGD) WITH PROPOFOL;  Surgeon: Wilford Corner, MD;  Location: [facility] ENDOSCOPY;  Service: Endoscopy;  Laterality: N/A;   GIVENS CAPSULE STUDY N/A 06/03/2015   Procedure: GIVENS CAPSULE STUDY;  Surgeon: Wilford Corner, MD;  Location: [facility] ENDOSCOPY;  Service: Endoscopy;  Laterality: N/A;   PACEMAKER PLACEMENT  2015   Family History  Problem Relation Age of Onset   CVA Mother    Lung cancer Father    Colon cancer Neg Hx    Social History   Socioeconomic History   Marital status:  Widowed    Spouse name: Not on file   Number of children: Not on file   Years of education: Not on file   Highest education level: Not on file  Occupational History   Not on file  Tobacco Use   Smoking status: Former   Smokeless tobacco: Never  Vaping Use   Vaping Use: Never used  Substance and Sexual Activity   Alcohol use: No    Comment: Quit in 1985   Drug use: No   Sexual activity: Not on file  Other Topics Concern   Not on file  Social History Narrative   Not on file   Social Determinants of Health   Financial Resource Strain: Not on file  Food Insecurity: Not on file  Transportation Needs: Not on file  Physical Activity: Not on file  Stress: Not on file  Social Connections: Not on file   Allergies  Allergen Reactions   Nsaids Nausea And Vomiting    Pt has preference to tylonel    Medications  (Not in a hospital admission)    Vitals   Vitals:   12/30/20 2007 12/30/20 2115  BP: (!) 148/85 (!) 159/85  Pulse: 69 (!) 59  Resp: 18 17  Temp: 97.9 F (36.6 C)   TempSrc: Oral   SpO2: 99% 96%     There is no height or weight on file to calculate BMI.  Physical Exam   General: Laying comfortably in bed; in no acute distress.  HENT: Normal oropharynx and mucosa. Normal external appearance of ears and nose.  Neck: Supple, no pain or tenderness  CV: No JVD. No peripheral edema.  Pulmonary: Symmetric Chest rise. Normal respiratory effort.  Abdomen: Soft to touch, non-tender.  Ext: No cyanosis, edema, or deformity  Skin: No rash. Normal palpation of skin.   Musculoskeletal: Normal digits and nails by inspection. No clubbing.   Neurologic Examination  Mental status/Cognition: Alert, oriented to self, place, month and year, good attention.  Speech/language: Fluent, comprehension intact, object naming intact, repetition intact. Cranial nerves:   CN II Pupils equal and reactive to light, no VF deficits    CN III,IV,VI EOM intact, no gaze preference or  deviation, no nystagmus    CN V normal sensation in V1, V2, and V3 segments bilaterally    CN VII no asymmetry, no nasolabial fold flattening    CN VIII normal hearing to speech    CN IX & X normal palatal elevation, no uvular deviation    CN  XI 5/5 head turn and 5/5 shoulder shrug bilaterally    CN XII midline tongue protrusion    Motor:  Muscle bulk: normal, tone normal, pronator drift none tremor none Mvmt Root Nerve  Muscle Right Left Comments  SA C5/6 Ax Deltoid 5 5   EF C5/6 Mc Biceps 5 5   EE C6/7/8 Rad Triceps 5 5   WF C6/7 Med FCR     WE C7/8 PIN ECU     F Ab C8/T1 U ADM/FDI 5 5   HF L1/2/3 Fem Illopsoas 5 5   KE L2/3/4 Fem Quad 5 5   DF L4/5 D Peron Tib Ant 5 5   PF S1/2 Tibial Grc/Sol 5 5    Reflexes:  Right Left Comments  Pectoralis      Biceps (C5/6) 1 1   Brachioradialis (C5/6) 1 1    Triceps (C6/7) 1 1    Patellar (L3/4) 1 1    Achilles (S1)      Hoffman      Plantar     Jaw jerk    Sensation:  Light touch Intact throughout   Pin prick    Temperature    Vibration   Proprioception    Coordination/Complex Motor:  - Finger to Nose intact bilaterally - Heel to shin intact bilaterally - Rapid alternating movement are intact - Gait: Deferred for patient safety.  Labs   CBC: No results for input(s): WBC, NEUTROABS, HGB, HCT, MCV, PLT in the last 168 hours.  Basic Metabolic Panel:  Lab Results  Component Value Date   NA 133 (L) 05/21/2019   K 3.7 05/21/2019   CO2 33 (H) 05/21/2019   GLUCOSE 113 (H) 05/21/2019   BUN 19 05/21/2019   CREATININE 0.90 10/18/2020   CALCIUM 7.9 (L) 05/21/2019   GFRNONAA 38 (L) 05/21/2019   GFRAA 44 (L) 05/21/2019   Lipid Panel: No results found for: LDLCALC HgbA1c: No results found for: HGBA1C Urine Drug Screen: No results found for: LABOPIA, COCAINSCRNUR, LABBENZ, AMPHETMU, THCU, LABBARB  Alcohol Level No results found for: [place]  CT Head without contrast: pending  CT angio Head and Neck with  contrast: pending  MRI Brain: pending  Impression   JDA HICKENBOTTOM is a 87 y.o. female with PMH significant for abdominal aortic aneurysm, CHF, CAD s/p RCA stenting, Crohn's disease, diverticulitis, hypertension, age-related macular degeneration, obstructive sleep apnea, heart block status post pacemaker placement who presents with an episode of aphasia that self resolved. Her neurologic examination is notable for no focal deficit.  Suspect this was a TIA. Recommend stroke workup and interrogation of her PPM.  Primary Diagnosis:  Cerebral infarction, unspecified.  Secondary Diagnosis: Essential (primary) hypertension  Recommendations  Plan: - Frequent Neuro checks per stroke unit protocol - Recommend brain imaging with MRI Brain without contrast - Recommend Vascular imaging with CT Angio head and neck - Recommend obtaining TTE - Recommend obtaining Lipid panel with LDL - Please start statin if LDL > 70 - Recommend HbA1c - Antithrombotic - Aspirin 81mg  daily. - Recommend DVT ppx - SBP goal - permissive hypertension first 24 h < 220/110. Held home meds.  - Recommend Telemetry monitoring for arrythmia - Recommend bedside swallow screen prior to PO intake. - Stroke education booklet - Recommend PT/OT/SLP consult - Pacemaker interrogation.  _____________________________________________________________________  Plan discussed with Dr. Roslynn Amble  Thank you for the opportunity to take part in the care of this patient. If you have any further questions, please contact the neurology consultation  attending.  Signed,  Erick Blinks [facility] Pager Number [phone] _ _ _   _ __   _ __ _ _  __ __   _ __   __ _

## 2020-12-31 ENCOUNTER — Observation Stay (HOSPITAL_BASED_OUTPATIENT_CLINIC_OR_DEPARTMENT_OTHER): Payer: Medicare Other

## 2020-12-31 ENCOUNTER — Emergency Department (HOSPITAL_COMMUNITY): Payer: Medicare Other

## 2020-12-31 ENCOUNTER — Encounter (HOSPITAL_COMMUNITY): Payer: Self-pay | Admitting: Radiology

## 2020-12-31 DIAGNOSIS — G459 Transient cerebral ischemic attack, unspecified: Secondary | ICD-10-CM | POA: Diagnosis not present

## 2020-12-31 DIAGNOSIS — R479 Unspecified speech disturbances: Secondary | ICD-10-CM

## 2020-12-31 LAB — CBC
HCT: 37.6 % (ref 36.0–46.0)
HCT: 35.4 % — ABNORMAL LOW (ref 36.0–46.0)
Hemoglobin: 12.2 g/dL (ref 12.0–15.0)
Hemoglobin: 11.7 g/dL — ABNORMAL LOW (ref 12.0–15.0)
MCH: 30.7 pg (ref 26.0–34.0)
MCH: 31.3 pg (ref 26.0–34.0)
MCHC: 32.4 g/dL (ref 30.0–36.0)
MCHC: 33.1 g/dL (ref 30.0–36.0)
MCV: 94.7 fL (ref 80.0–100.0)
MCV: 94.7 fL (ref 80.0–100.0)
Platelets: 240 10*3/uL (ref 150–400)
Platelets: 205 10*3/uL (ref 150–400)
RBC: 3.97 MIL/uL (ref 3.87–5.11)
RBC: 3.74 MIL/uL — ABNORMAL LOW (ref 3.87–5.11)
RDW: 15.9 % — ABNORMAL HIGH (ref 11.5–15.5)
RDW: 16 % — ABNORMAL HIGH (ref 11.5–15.5)
WBC: 7.8 10*3/uL (ref 4.0–10.5)
WBC: 5.8 10*3/uL (ref 4.0–10.5)
nRBC: 0 % (ref 0.0–0.2)
nRBC: 0 % (ref 0.0–0.2)

## 2020-12-31 LAB — LIPID PANEL
Cholesterol: 132 mg/dL (ref 0–200)
HDL: 49 mg/dL (ref >40)
LDL Cholesterol: 74 mg/dL (ref 0–99)
Total CHOL/HDL Ratio: 2.7 RATIO
Triglycerides: 47 mg/dL (ref <150)
VLDL: 9 mg/dL (ref 0–40)

## 2020-12-31 LAB — COMPREHENSIVE METABOLIC PANEL
ALT: 13 U/L (ref 0–44)
AST: 17 U/L (ref 15–41)
Albumin: 2.8 g/dL — ABNORMAL LOW (ref 3.5–5.0)
Alkaline Phosphatase: 65 U/L (ref 38–126)
Anion gap: 6 (ref 5–15)
BUN: 17 mg/dL (ref 8–23)
CO2: 27 mmol/L (ref 22–32)
Calcium: 8.1 mg/dL — ABNORMAL LOW (ref 8.9–10.3)
Chloride: 102 mmol/L (ref 98–111)
Creatinine, Ser: 1.11 mg/dL — ABNORMAL HIGH (ref 0.44–1.00)
GFR, Estimated: 48 mL/min — ABNORMAL LOW (ref >60)
Glucose, Bld: 111 mg/dL — ABNORMAL HIGH (ref 70–99)
Potassium: 3.6 mmol/L (ref 3.5–5.1)
Sodium: 135 mmol/L (ref 135–145)
Total Bilirubin: 0.9 mg/dL (ref 0.3–1.2)
Total Protein: 6.2 g/dL — ABNORMAL LOW (ref 6.5–8.1)

## 2020-12-31 LAB — CREATININE, SERUM
Creatinine, Ser: 0.97 mg/dL (ref 0.44–1.00)
GFR, Estimated: 56 mL/min — ABNORMAL LOW (ref >60)

## 2020-12-31 LAB — ECHOCARDIOGRAM COMPLETE
AR max vel: 1.91 cm2
AV Area VTI: 1.98 cm2
AV Area mean vel: 1.95 cm2
AV Mean grad: 4 mmHg
AV Peak grad: 8.4 mmHg
Ao pk vel: 1.45 m/s
Area-P 1/2: 3.53 cm2
Height: 66 in
MV VTI: 1.88 cm2
S' Lateral: 2.9 cm
Weight: 2960 oz

## 2020-12-31 LAB — URINALYSIS, ROUTINE W REFLEX MICROSCOPIC
Bilirubin Urine: NEGATIVE
Glucose, UA: NEGATIVE mg/dL
Hgb urine dipstick: NEGATIVE
Ketones, ur: NEGATIVE mg/dL
Leukocytes,Ua: NEGATIVE
Nitrite: POSITIVE — AB
Protein, ur: NEGATIVE mg/dL
Specific Gravity, Urine: 1.02 (ref 1.005–1.030)
pH: 6 (ref 5.0–8.0)

## 2020-12-31 LAB — DIFFERENTIAL
Abs Immature Granulocytes: 0.03 10*3/uL (ref 0.00–0.07)
Basophils Absolute: 0 10*3/uL (ref 0.0–0.1)
Basophils Relative: 0 %
Eosinophils Absolute: 0.1 10*3/uL (ref 0.0–0.5)
Eosinophils Relative: 1 %
Immature Granulocytes: 0 %
Lymphocytes Relative: 23 %
Lymphs Abs: 1.8 10*3/uL (ref 0.7–4.0)
Monocytes Absolute: 0.8 10*3/uL (ref 0.1–1.0)
Monocytes Relative: 10 %
Neutro Abs: 5.1 10*3/uL (ref 1.7–7.7)
Neutrophils Relative %: 66 %

## 2020-12-31 LAB — I-STAT CHEM 8, ED
BUN: 19 mg/dL (ref 8–23)
Calcium, Ion: 1.05 mmol/L — ABNORMAL LOW (ref 1.15–1.40)
Chloride: 100 mmol/L (ref 98–111)
Creatinine, Ser: 1 mg/dL (ref 0.44–1.00)
Glucose, Bld: 108 mg/dL — ABNORMAL HIGH (ref 70–99)
HCT: 40 % (ref 36.0–46.0)
Hemoglobin: 13.6 g/dL (ref 12.0–15.0)
Potassium: 3.6 mmol/L (ref 3.5–5.1)
Sodium: 137 mmol/L (ref 135–145)
TCO2: 28 mmol/L (ref 22–32)

## 2020-12-31 LAB — RAPID URINE DRUG SCREEN, HOSP PERFORMED
Amphetamines: NOT DETECTED
Barbiturates: NOT DETECTED
Benzodiazepines: NOT DETECTED
Cocaine: NOT DETECTED
Opiates: NOT DETECTED
Tetrahydrocannabinol: NOT DETECTED

## 2020-12-31 LAB — URINALYSIS, MICROSCOPIC (REFLEX)

## 2020-12-31 LAB — RESP PANEL BY RT-PCR (FLU A&B, COVID) ARPGX2
Influenza A by PCR: NEGATIVE
Influenza B by PCR: NEGATIVE
SARS Coronavirus 2 by RT PCR: NEGATIVE

## 2020-12-31 LAB — HEMOGLOBIN A1C
Hgb A1c MFr Bld: 5.8 % — ABNORMAL HIGH (ref 4.8–5.6)
Mean Plasma Glucose: 119.76 mg/dL

## 2020-12-31 LAB — PROTIME-INR
INR: 1 (ref 0.8–1.2)
Prothrombin Time: 13.5 seconds (ref 11.4–15.2)

## 2020-12-31 IMAGING — CT CT ANGIO HEAD-NECK (W OR W/O PERF)
2 of 11 series · 7 of 35 positions shown · IV contrast (omnipaque)
Comparison: None.

CLINICAL DATA: New onset aphasia and headache

EXAM:
CT ANGIOGRAPHY HEAD AND NECK
TECHNIQUE: Multidetector CT imaging of the head and neck was performed using
the standard protocol during bolus administration of intravenous
contrast. Multiplanar CT image reconstructions and MIPs were
obtained to evaluate the vascular anatomy. Carotid stenosis
measurements (when applicable) are obtained utilizing NASCET
criteria, using the distal internal carotid diameter as the
denominator.
CONTRAST:  60mL OMNIPAQUE IOHEXOL 350 MG/ML SOLN

[Series 9: cta neck · axial · 0.54mm/px · z∈[-199,-81]mm · 2 of 171 slices shown]
[im 57/171  soft-tissue]
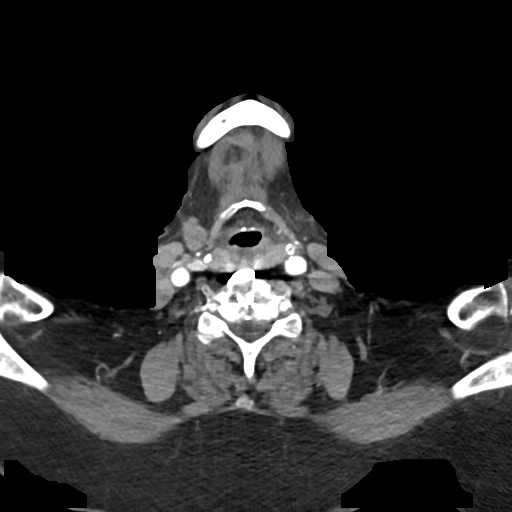
[im 114/171  bone]
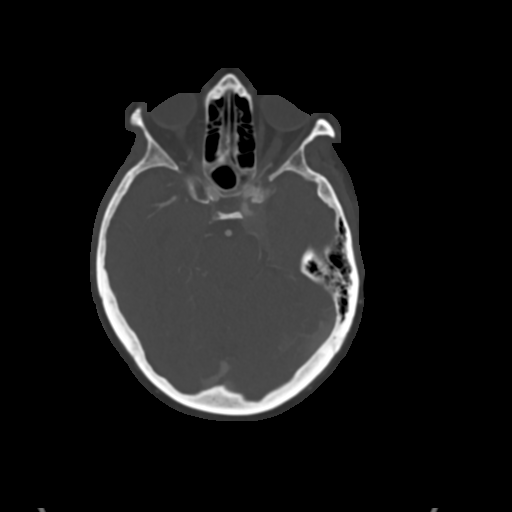

[Series 11: cta neck axial · axial · 0.48mm/px · z∈[-256,-29]mm · 5 of 334 slices shown]
[im 56/334  soft-tissue]
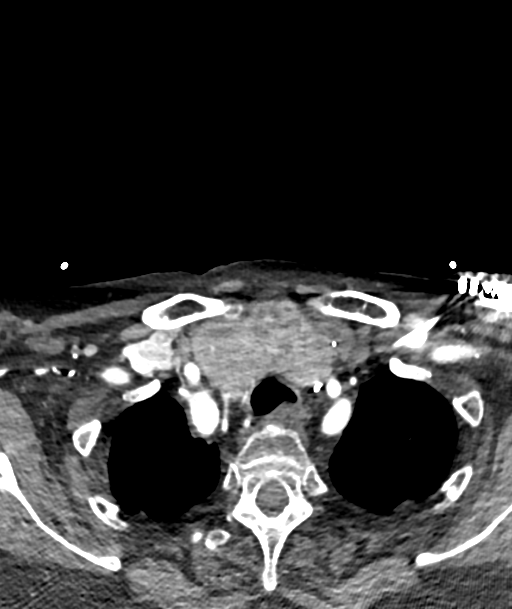
[im 112/334  soft-tissue]
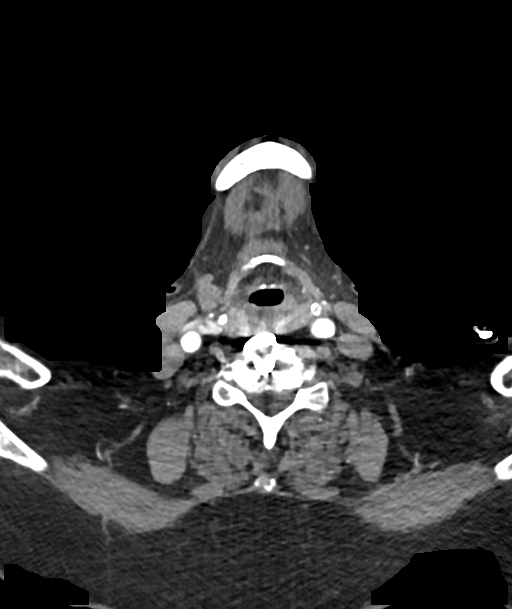
[im 167/334  soft-tissue]
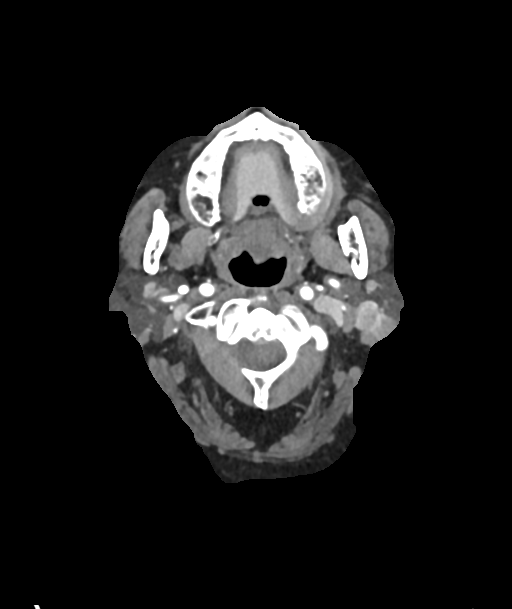
[im 223/334  soft-tissue]
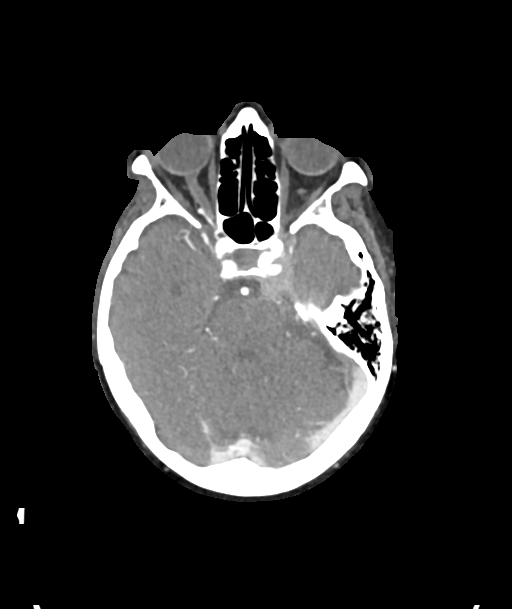
[im 278/334  soft-tissue]
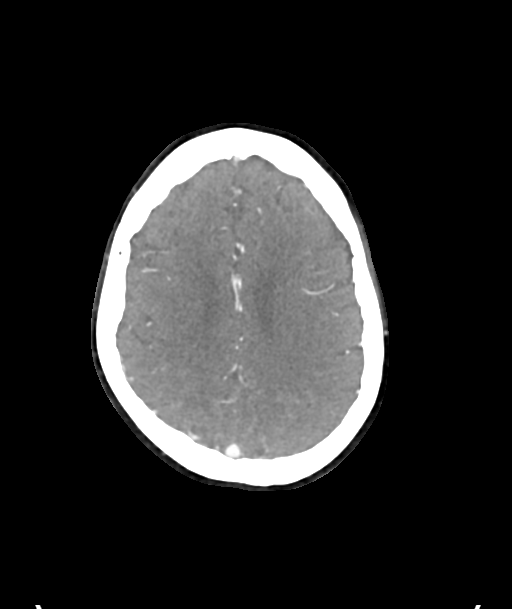

[7 of 35 positions shown; findings below may reference images not displayed]

FINDINGS: CT HEAD FINDINGS

Brain: There is a left paraclinoid mass that surrounds the cavernous
segment of the left internal carotid artery. The mass extends around
the left dorsal aspect of the clivus. The size and configuration of
the ventricles and extra-axial CSF spaces are normal. There is
hypoattenuation of the periventricular white matter, most commonly
indicating chronic ischemic microangiopathy.

Skull: The visualized skull base, calvarium and extracranial soft
tissues are normal.

Sinuses/Orbits: No fluid levels or advanced mucosal thickening of
the visualized paranasal sinuses. No mastoid or middle ear effusion.
The orbits are normal.

CTA NECK FINDINGS

SKELETON: There is no bony spinal canal stenosis. No lytic or
blastic lesion.

OTHER NECK: Heterogeneous and enlarged thyroid gland. Hyperenhancing
left neck mass at the inferior aspect of the left parotid gland,
measuring 3.2 x 1.9 x 4.4 cm.

UPPER CHEST: No pneumothorax or pleural effusion. No nodules or
masses.

AORTIC ARCH:

There is calcific atherosclerosis of the aortic arch. There is no
aneurysm, dissection or hemodynamically significant stenosis of the
visualized portion of the aorta. Conventional 3 vessel aortic
branching pattern. The visualized proximal subclavian arteries are
widely patent.

RIGHT CAROTID SYSTEM: No dissection, occlusion or aneurysm. There is
predominantly calcified atherosclerosis extending into the proximal
ICA, resulting in 65% stenosis.

LEFT CAROTID SYSTEM: No dissection, occlusion or aneurysm. There is
calcified atherosclerosis extending into the proximal ICA, resulting
in 60% stenosis.

VERTEBRAL ARTERIES: Codominant configuration. Both origins are
clearly patent. There is no dissection, occlusion or flow-limiting
stenosis to the skull base (V1-V3 segments).

CTA HEAD FINDINGS

POSTERIOR CIRCULATION:

--Vertebral arteries: Normal V4 segments.

--Inferior cerebellar arteries: Normal.

--Basilar artery: Normal.

--Superior cerebellar arteries: Normal.

--Posterior cerebral arteries (PCA): Normal.

ANTERIOR CIRCULATION:

--Intracranial internal carotid arteries: Normal.

--Anterior cerebral arteries (ACA): Normal. Both A1 segments are
present. Patent anterior communicating artery (a-comm).

--Middle cerebral arteries (MCA): Normal.

VENOUS SINUSES: As permitted by contrast timing, patent.

ANATOMIC VARIANTS: None

Review of the MIP images confirms the above findings.
IMPRESSION: 1. No intracranial arterial occlusion or high-grade stenosis.
2. Hyperenhancing left paraclinoid mass that surrounds the cavernous
segment of the left internal carotid artery and extends around the
left dorsal aspect of the clivus, most consistent with meningioma.
3. Hyperenhancing left neck mass at the inferior aspect of the left
parotid gland, measuring 3.2 x 1.9 x 4.4 cm, possibly a primary
parotid neoplasm. ENT consultation and histologic sampling should be
considered.
4. Bilateral carotid bifurcation atherosclerosis resulting in
bilateral proximal ICA stenosis, measuring 65% on the right and 60%
on the left.
5. Incidental heterogeneous and enlarged thyroid. Recommend thyroid
US if clinically warranted given patient age.
Reference: [HOSPITAL]. [DATE]): 143-50

Aortic Atherosclerosis ([FE]-[FE]).

## 2020-12-31 MED ORDER — SENNOSIDES-DOCUSATE SODIUM 8.6-50 MG PO TABS: 1.0000 | ORAL_TABLET | Freq: Every evening | ORAL | Status: DC | PRN

## 2020-12-31 MED ORDER — OXYCODONE HCL 5 MG PO TABS: 5.0000 mg | ORAL_TABLET | ORAL | Status: DC | PRN

## 2020-12-31 MED ORDER — MOMETASONE FURO-FORMOTEROL FUM 100-5 MCG/ACT IN AERO
2.0000 | INHALATION_SPRAY | Freq: Two times a day (BID) | RESPIRATORY_TRACT | Status: DC
  Filled 2020-12-31: qty 8.8

## 2020-12-31 MED ORDER — SODIUM CHLORIDE 0.9 % IV SOLN
1.0000 g | Freq: Once | INTRAVENOUS | Status: AC
  Administered 2020-12-31: 03:00:00 1 g via INTRAVENOUS
  Filled 2020-12-31: qty 10

## 2020-12-31 MED ORDER — ATORVASTATIN CALCIUM 20 MG PO TABS: 20.0000 mg | ORAL_TABLET | Freq: Every day | ORAL | 0 refills | Status: DC

## 2020-12-31 MED ORDER — LORAZEPAM 2 MG/ML IJ SOLN: 0.2500 mg | Freq: Once | INTRAMUSCULAR | Status: DC

## 2020-12-31 MED ORDER — PANTOPRAZOLE SODIUM 40 MG PO TBEC: 40.0000 mg | DELAYED_RELEASE_TABLET | Freq: Every day | ORAL | Status: DC

## 2020-12-31 MED ORDER — ATORVASTATIN CALCIUM 10 MG PO TABS: 10.0000 mg | ORAL_TABLET | Freq: Every day | ORAL | Status: DC

## 2020-12-31 MED ORDER — TRAMADOL HCL 50 MG PO TABS
50.0000 mg | ORAL_TABLET | Freq: Two times a day (BID) | ORAL | Status: DC | PRN
Start: 2020-12-31 — End: 2020-12-31

## 2020-12-31 MED ORDER — TRAZODONE HCL 50 MG PO TABS: 50.0000 mg | ORAL_TABLET | Freq: Every evening | ORAL | Status: DC | PRN

## 2020-12-31 MED ORDER — GUAIFENESIN 100 MG/5ML PO LIQD
5.0000 mL | ORAL | Status: DC | PRN
  Filled 2020-12-31: qty 5

## 2020-12-31 MED ORDER — HYDRALAZINE HCL 20 MG/ML IJ SOLN: 10.0000 mg | INTRAMUSCULAR | Status: DC | PRN

## 2020-12-31 MED ORDER — IPRATROPIUM-ALBUTEROL 0.5-2.5 (3) MG/3ML IN SOLN: 3.0000 mL | RESPIRATORY_TRACT | Status: DC | PRN

## 2020-12-31 MED ORDER — ENOXAPARIN SODIUM 40 MG/0.4ML IJ SOSY: 40.0000 mg | PREFILLED_SYRINGE | INTRAMUSCULAR | Status: DC

## 2020-12-31 MED ORDER — ASPIRIN 81 MG PO CHEW: 81.0000 mg | CHEWABLE_TABLET | Freq: Every day | ORAL | Status: DC

## 2020-12-31 MED ORDER — MESALAMINE ER 250 MG PO CPCR
1500.0000 mg | ORAL_CAPSULE | Freq: Two times a day (BID) | ORAL | Status: DC
  Filled 2020-12-31 (×2): qty 6

## 2020-12-31 MED ORDER — MONTELUKAST SODIUM 10 MG PO TABS
10.0000 mg | ORAL_TABLET | Freq: Every day | ORAL | Status: DC
  Filled 2020-12-31: qty 1

## 2020-12-31 MED ORDER — ACETAMINOPHEN 325 MG PO TABS
650.0000 mg | ORAL_TABLET | Freq: Four times a day (QID) | ORAL | Status: DC | PRN
  Administered 2020-12-31: 04:00:00 650 mg via ORAL
  Filled 2020-12-31: qty 2

## 2020-12-31 MED ORDER — SODIUM CHLORIDE 0.9 % IV SOLN: 1.0000 g | INTRAVENOUS | Status: DC

## 2020-12-31 MED ORDER — ASPIRIN EC 325 MG PO TBEC: 325.0000 mg | DELAYED_RELEASE_TABLET | Freq: Every day | ORAL | 0 refills | Status: DC

## 2020-12-31 MED ORDER — STROKE: EARLY STAGES OF RECOVERY BOOK
Freq: Once | Status: AC
  Administered 2020-12-31: 06:00:00 1
  Filled 2020-12-31: qty 1

## 2020-12-31 MED ORDER — IOHEXOL 350 MG/ML SOLN
60.0000 mL | Freq: Once | INTRAVENOUS | Status: AC | PRN
  Administered 2020-12-31: 02:00:00 60 mL via INTRAVENOUS

## 2020-12-31 MED ORDER — ATORVASTATIN CALCIUM 10 MG PO TABS: 20.0000 mg | ORAL_TABLET | Freq: Every day | ORAL | Status: DC

## 2020-12-31 MED ORDER — CEPHALEXIN 500 MG PO CAPS: 500.0000 mg | ORAL_CAPSULE | Freq: Four times a day (QID) | ORAL | 0 refills | Status: AC

## 2020-12-31 NOTE — Progress Notes (Addendum)
STROKE TEAM PROGRESS NOTE   INTERVAL HISTORY Patient is seen in her room with no family at the bedside.  Yesterday, while she was cooking, she had a 45 minute episode of aphasia.  Her family called EMS, but symptoms resolved before she got to the ED.  CTA head and neck reveals no LVO.  Patient's pacemaker has been interrogated, and she can hopefully be discharged if she is able to get her MRI today.  Vitals:   12/31/20 0930 12/31/20 1015 12/31/20 1015 12/31/20 1018  BP:    139/66  Pulse: (!) 59 (!) 59    Resp: 15 (!) 22    Temp:   97.7 F (36.5 C)   TempSrc:   Oral   SpO2: 93% 95%    Weight:      Height:       CBC:  Recent Labs  Lab 12/30/20 0100 12/31/20 0057 12/31/20 0624  WBC 7.8  --  5.8  NEUTROABS 5.1  --   --   HGB 12.2 13.6 11.7*  HCT 37.6 40.0 35.4*  MCV 94.7  --  94.7  PLT 240  --  99991111   Basic Metabolic Panel:  Recent Labs  Lab 12/30/20 0100 12/31/20 0057 12/31/20 0624  NA 135 137  --   K 3.6 3.6  --   CL 102 100  --   CO2 27  --   --   GLUCOSE 111* 108*  --   BUN 17 19  --   CREATININE 1.11* 1.00 0.97  CALCIUM 8.1*  --   --    Lipid Panel:  Recent Labs  Lab 12/31/20 0624  CHOL 132  TRIG 47  HDL 49  CHOLHDL 2.7  VLDL 9  LDLCALC 74   HgbA1c:  Recent Labs  Lab 12/31/20 0624  HGBA1C 5.8*   Urine Drug Screen:  Recent Labs  Lab 12/30/20 0100  LABOPIA NONE DETECTED  COCAINSCRNUR NONE DETECTED  LABBENZ NONE DETECTED  AMPHETMU NONE DETECTED  THCU NONE DETECTED  LABBARB NONE DETECTED    Alcohol Level No results for input(s): ETH in the last 168 hours.  IMAGING past 24 hours CT ANGIO HEAD NECK W WO CM  Result Date: 12/31/2020 CLINICAL DATA:  New onset aphasia and headache EXAM: CT ANGIOGRAPHY HEAD AND NECK TECHNIQUE: Multidetector CT imaging of the head and neck was performed using the standard protocol during bolus administration of intravenous contrast. Multiplanar CT image reconstructions and MIPs were obtained to evaluate the vascular  anatomy. Carotid stenosis measurements (when applicable) are obtained utilizing NASCET criteria, using the distal internal carotid diameter as the denominator. CONTRAST:  33mL OMNIPAQUE IOHEXOL 350 MG/ML SOLN COMPARISON:  None. FINDINGS: CT HEAD FINDINGS Brain: There is a left paraclinoid mass that surrounds the cavernous segment of the left internal carotid artery. The mass extends around the left dorsal aspect of the clivus. The size and configuration of the ventricles and extra-axial CSF spaces are normal. There is hypoattenuation of the periventricular white matter, most commonly indicating chronic ischemic microangiopathy. Skull: The visualized skull base, calvarium and extracranial soft tissues are normal. Sinuses/Orbits: No fluid levels or advanced mucosal thickening of the visualized paranasal sinuses. No mastoid or middle ear effusion. The orbits are normal. CTA NECK FINDINGS SKELETON: There is no bony spinal canal stenosis. No lytic or blastic lesion. OTHER NECK: Heterogeneous and enlarged thyroid gland. Hyperenhancing left neck mass at the inferior aspect of the left parotid gland, measuring 3.2 x 1.9 x 4.4 cm. UPPER CHEST: No  pneumothorax or pleural effusion. No nodules or masses. AORTIC ARCH: There is calcific atherosclerosis of the aortic arch. There is no aneurysm, dissection or hemodynamically significant stenosis of the visualized portion of the aorta. Conventional 3 vessel aortic branching pattern. The visualized proximal subclavian arteries are widely patent. RIGHT CAROTID SYSTEM: No dissection, occlusion or aneurysm. There is predominantly calcified atherosclerosis extending into the proximal ICA, resulting in 65% stenosis. LEFT CAROTID SYSTEM: No dissection, occlusion or aneurysm. There is calcified atherosclerosis extending into the proximal ICA, resulting in 60% stenosis. VERTEBRAL ARTERIES: Codominant configuration. Both origins are clearly patent. There is no dissection, occlusion or  flow-limiting stenosis to the skull base (V1-V3 segments). CTA HEAD FINDINGS POSTERIOR CIRCULATION: --Vertebral arteries: Normal V4 segments. --Inferior cerebellar arteries: Normal. --Basilar artery: Normal. --Superior cerebellar arteries: Normal. --Posterior cerebral arteries (PCA): Normal. ANTERIOR CIRCULATION: --Intracranial internal carotid arteries: Normal. --Anterior cerebral arteries (ACA): Normal. Both A1 segments are present. Patent anterior communicating artery (a-comm). --Middle cerebral arteries (MCA): Normal. VENOUS SINUSES: As permitted by contrast timing, patent. ANATOMIC VARIANTS: None Review of the MIP images confirms the above findings. IMPRESSION: 1. No intracranial arterial occlusion or high-grade stenosis. 2. Hyperenhancing left paraclinoid mass that surrounds the cavernous segment of the left internal carotid artery and extends around the left dorsal aspect of the clivus, most consistent with meningioma. 3. Hyperenhancing left neck mass at the inferior aspect of the left parotid gland, measuring 3.2 x 1.9 x 4.4 cm, possibly a primary parotid neoplasm. ENT consultation and histologic sampling should be considered. 4. Bilateral carotid bifurcation atherosclerosis resulting in bilateral proximal ICA stenosis, measuring 65% on the right and 60% on the left. 5. Incidental heterogeneous and enlarged thyroid. Recommend thyroid US if clinically warranted given patient age. Reference: J Am Coll Radiol. 2015 Feb;12(2): 143-50 Aortic Atherosclerosis (ICD10-I70.0). Electronically Signed   By: Ulyses Jarred M.D.   On: 12/31/2020 02:27    PHYSICAL EXAM General: Alert, elderly female in no acute distress   NEURO:  Mental Status: AA&Ox3  Speech/Language: speech is without dysarthria or aphasia.  Naming, repetition, fluency, and comprehension intact.  Cranial Nerves:  II: PERRL. Visual fields full.  III, IV, VI: EOMI. Eyelids elevate symmetrically.  V: Sensation is intact to light touch and  symmetrical to face.  VII: Smile is symmetrical.   VIII: hearing intact to voice. IX, X:  Phonation is normal.  XII: tongue is midline without fasciculations. Motor: 5/5 strength to all muscle groups tested.  Sensation- Intact to light touch bilaterally.   Coordination: No drift.  Gait- deferred   ASSESSMENT/PLAN Felicia Benson is a 87 y.o. female with history of abdominal aortic aneurysm,  CHF, CAD s/p RCA stent, Crohn's disease, diverticulitis, macular degeneration, OSA and heart block with pacemaker presenting with a 45 minute episode of aphasia. Her family called EMS when her symptoms began, but symptoms resolved before she got to the ED.  CTA head and neck reveals no LVO.  Patient's pacemaker has been interrogated, and she can hopefully be discharged if she is able to get her MRI today.  TIA:    CTA head & neck no intracranial occlusion or high grade stenosis, left paraclinoid mass surrounding cavernous segment of ICA, most consistent with meningioma, left neck mass at inferior aspect of left parotid gland, bilateral carotid bifurcation atherosclerosis with 65% stenosis on right and 60% on left, heterogenous and enlarged thyroid MRI  cannot be done until next Wednesday due to pacemaker. 2D Echo pending LDL 74 HgbA1c 5.8 VTE prophylaxis -  lovenox    There are no active orders of the following types: Diet, Nourishments.   aspirin 81 mg daily prior to admission, now on aspirin 81 mg daily. Patient states that she was on Plavix at one time and had a large GI bleed Therapy recommendations:  No PT/OT follow up Disposition:  to home  Hypertension Home meds:  amlodipine 10 mg daily, cozaar 25 mg daily Stable Keep SBP <180 Long-term BP goal normotensive  Hyperlipidemia Home meds:  atorvastatin 10 mg daily,  LDL 74, goal < 70 Increase atorvastatin to 20 mg daily  High intensity statin not indicated due to advanced age Continue statin at discharge    Other Stroke Risk  Factors Advanced Age >/= 53  Former Cigarette smoker Coronary artery disease Obstructive sleep apnea CHF   Other Active Problems Hyperenhancing left paraclinoid mass most consistent with meningioma. Patient to follow up with PCP for appropriate referral and follow-up Hyperenhancing left parotid mass Patient is seeing outpatient ENT provider for this Heterogenous and enlarged thyroid Consider thyroid ultrasound Crohn's disease Continue home Pentasa Patient wishes to avoid Plavix due to history of GI bleed  Hospital day # 0  Cortney E Ernestina Columbia , MSN, AGACNP-BC [facility] See Amion for schedule and pager information 12/31/2020 11:16 AM  ATTENDING ATTESTATION:  Pt with 40 mins speech difficulty likely TIA. Symptoms now resolved. Has h/o visual hallucinations. MRI can't be done until Wednesday due to pacemaker. Can't do outpt and f/u in stroke clinic. Con't aspirin due to h/o bleeding with plavix for now. Consider changing after MRI resulted during stroke f/u visit. Discussed with Dr. Nelson Chimes.  Dr. Viviann Spare evaluated pt independently, reviewed imaging, chart, labs. Discussed and formulated plan with the APP. Please see APP note above for details.   Total 30 minutes spent on counseling patient and coordinating care, writing notes and reviewing chart.   Lc Joynt,MD     To contact Stroke Continuity provider, please refer to [email or web address]. After hours, contact General Neurology

## 2020-12-31 NOTE — Discharge Summary (Signed)
Physician Discharge Summary  SHANTAYA BLUESTONE HCW:[id number] DOB: [birth year] DOA: 12/30/2020  PCP: System, Provider Not In  Admit date: 12/30/2020 Discharge date: 12/31/2020  Admitted From: Home Disposition: Home  Recommendations for Outpatient Follow-up:  Follow up with PCP in 1-2 weeks Please obtain BMP/CBC in one week your next doctors visit.  Aspirin 325 mg daily.  Already on daily PPI at home Oral Keflex for 4 more days Atorvastatin increased to 20 mg daily Outpatient MRI prescription given, patient has [employer]tr[employer] pacemaker.  Spoke with neurology, recommended outpatient follow-up with Dr. Pearlean Brownie   Discharge Condition: Stable CODE STATUS: Full code Diet recommendation: Heart healthy  Brief/Interim Summary: 87 year old with history of diastolic CHF, HTN, CAD status post RCA stent, CKD stage III, thoracic aortic aneurysm, Crohn's disease admitted for difficulty speaking that started around 7 PM on 12/23.  CT head and CTA head and neck did not reveal any acute abnormality.  Neurology was consulted.  UA was concerning for possible urinary tract infection.  Pacemaker was interrogated and did not have any abnormality.  MRI could not be performed until 3-4 days due to holidays and no available staff.  Cleared by neurology for discharge with outpatient MRI and follow-up with Dr. Pearlean Brownie.Atorvastatin increased to 20 mg daily, aspirin 3 and 25 mg daily.  Continue PPI.  Not a candidate for Plavix due to history of severe GI bleed. Rest the recommendations as stated above.    Dysarthria concerning for TIA - CT head and CTA head and neck negative for acute stroke but does show proximal ICA stenosis bilaterally about 60%. - MRI brain-unable to perform due to pacemaker for another 3-4 days therefore recommended outpatient MRI and follow-up with outpatient neurology, Dr. Pearlean Brownie.  Seen by neurology team in the hospital. - LDL 74, A1c-5.8, UDS negative - Echocardiogram done.  Results pending - Full dose  aspirin.  Lipitor increased to 20 mg daily.  Continue PPI.   Urinary tract infection - Urine culture sent.  Empiric IV Rocephin, transition to oral Keflex for 4 more days.  Essential hypertension - Permissive hypertension.   History of sick sinus syndrome - Status post pacemaker-Medtronic.  History of COPD on home oxygen - Bronchodilators.  I-S/flutter valve.  CKD stage IIIa - Creatinine currently at baseline  Left parotid tumor with enlarged thyroid - Continue to follow-up with outpatient ENT.  Thoracic aortic aneurysm - Chest pain-free.  Outpatient follow-up with cardiology  Crohn's disease - On Pentasa  Diastolic CHF - Resume home medications.  Meningioma seen on the CT scan - Should be followed up outpatient with PCP if patient wishes to do so in the future.      Body mass index is 29.86 kg/m.         Discharge Diagnoses:  Principal Problem:   TIA (transient ischemic attack) Active Problems:   Hypertension   Crohn disease (HCC)   COPD without exacerbation (HCC)   Pacemaker   AAA (abdominal aortic aneurysm)      Consultations: Neurology  Subjective: Feels great no complaints.  Her symptoms have resolved completely.  Discharge Exam: Vitals:   12/31/20 1018 12/31/20 1100  BP: 139/66 (!) 143/74  Pulse:  90  Resp:  19  Temp:  97.7 F (36.5 C)  SpO2:  96%   Vitals:   12/31/20 1015 12/31/20 1015 12/31/20 1018 12/31/20 1100  BP:   139/66 (!) 143/74  Pulse: (!) 59   90  Resp: (!) 22   19  Temp:  97.7 F (36.5 C)  97.7 F (36.5 C)  TempSrc:  Oral  Oral  SpO2: 95%   96%  Weight:      Height:        General: Pt is alert, awake, not in acute distress Cardiovascular: RRR, S1/S2 +, no rubs, no gallops Respiratory: CTA bilaterally, no wheezing, no rhonchi Abdominal: Soft, NT, ND, bowel sounds + Extremities: no edema, no cyanosis  Discharge Instructions   Allergies as of 12/31/2020       Reactions   Nsaids Nausea And Vomiting   Pt  has preference to tylonel        Medication List     TAKE these medications    acetaminophen 325 MG tablet Commonly known as: TYLENOL Take 650 mg by mouth 2 (two) times daily as needed for mild pain or headache.   amLODipine 10 MG tablet Commonly known as: NORVASC Take 1 tablet (10 mg total) by mouth every evening. Hold for next few days   aspirin EC 325 MG tablet Take 1 tablet (325 mg total) by mouth daily. What changed:  medication strength how much to take   atorvastatin 20 MG tablet Commonly known as: LIPITOR Take 1 tablet (20 mg total) by mouth daily. Start taking on: January 01, 2021 What changed:  medication strength how much to take   cephALEXin 500 MG capsule Commonly known as: KEFLEX Take 1 capsule (500 mg total) by mouth 4 (four) times daily for 4 days.   cloNIDine 0.2 mg/24hr patch Commonly known as: CATAPRES - Dosed in mg/24 hr Place 0.2 mg onto the skin once a week.   Dulera 100-5 MCG/ACT Aero Generic drug: mometasone-formoterol Inhale 2 puffs into the lungs 2 (two) times daily at 8 am and 10 pm.   esomeprazole 40 MG capsule Commonly known as: NEXIUM Take 40 mg by mouth daily.   furosemide 40 MG tablet Commonly known as: LASIX Take 1 tablet (40 mg total) by mouth daily. May take additional 1 tablet (40 mg) for more swelling, shortness of breath or over 2 lbs weight gain   hydroxypropyl methylcellulose / hypromellose 2.5 % ophthalmic solution Commonly known as: ISOPTO TEARS / GONIOVISC Place 1 drop into both eyes 2 (two) times daily as needed for dry eyes.   losartan 25 MG tablet Commonly known as: COZAAR Take 1 tablet (25 mg total) by mouth daily.   mesalamine 500 MG CR capsule Commonly known as: PENTASA Take 1,500 mg by mouth 2 (two) times daily.   metoprolol succinate 25 MG 24 hr tablet Commonly known as: TOPROL-XL Take 75 mg by mouth 2 (two) times daily.   montelukast 10 MG tablet Commonly known as: SINGULAIR Take 10 mg by mouth  daily.   multivitamin-lutein Caps capsule Take 1 capsule by mouth daily.   nitroGLYCERIN 0.3 MG SL tablet Commonly known as: NITROSTAT Place 0.3 mg under the tongue every 5 (five) hours as needed for chest pain.   pantoprazole 40 MG tablet Commonly known as: PROTONIX Take 40 mg by mouth daily.   Symbicort 160-4.5 MCG/ACT inhaler Generic drug: budesonide-formoterol Inhale 2 puffs into the lungs in the morning and at bedtime.   traMADol 50 MG tablet Commonly known as: ULTRAM Take 50-100 mg by mouth 2 (two) times daily as needed for moderate pain.   triamcinolone cream 0.1 % Commonly known as: KENALOG Apply 1 application topically 2 (two) times daily as needed (yeast infection). Apply to skin under breast tissue   Vitamin D3 125 MCG (5000 UT) Tabs Take 5,000 Units  by mouth daily.        Allergies  Allergen Reactions   Nsaids Nausea And Vomiting    Pt has preference to tylonel    You were cared for by a hospitalist during your hospital stay. If you have any questions about your discharge medications or the care you received while you were in the hospital after you are discharged, you can call the unit and asked to speak with the hospitalist on call if the hospitalist that took care of you is not available. Once you are discharged, your primary care physician will handle any further medical issues. Please note that no refills for any discharge medications will be authorized once you are discharged, as it is imperative that you return to your primary care physician (or establish a relationship with a primary care physician if you do not have one) for your aftercare needs so that they can reassess your need for medications and monitor your lab values.   Procedures/Studies: CT ANGIO HEAD NECK W WO CM  Result Date: 12/31/2020 CLINICAL DATA:  New onset aphasia and headache EXAM: CT ANGIOGRAPHY HEAD AND NECK TECHNIQUE: Multidetector CT imaging of the head and neck was performed  using the standard protocol during bolus administration of intravenous contrast. Multiplanar CT image reconstructions and MIPs were obtained to evaluate the vascular anatomy. Carotid stenosis measurements (when applicable) are obtained utilizing NASCET criteria, using the distal internal carotid diameter as the denominator. CONTRAST:  26mL OMNIPAQUE IOHEXOL 350 MG/ML SOLN COMPARISON:  None. FINDINGS: CT HEAD FINDINGS Brain: There is a left paraclinoid mass that surrounds the cavernous segment of the left internal carotid artery. The mass extends around the left dorsal aspect of the clivus. The size and configuration of the ventricles and extra-axial CSF spaces are normal. There is hypoattenuation of the periventricular white matter, most commonly indicating chronic ischemic microangiopathy. Skull: The visualized skull base, calvarium and extracranial soft tissues are normal. Sinuses/Orbits: No fluid levels or advanced mucosal thickening of the visualized paranasal sinuses. No mastoid or middle ear effusion. The orbits are normal. CTA NECK FINDINGS SKELETON: There is no bony spinal canal stenosis. No lytic or blastic lesion. OTHER NECK: Heterogeneous and enlarged thyroid gland. Hyperenhancing left neck mass at the inferior aspect of the left parotid gland, measuring 3.2 x 1.9 x 4.4 cm. UPPER CHEST: No pneumothorax or pleural effusion. No nodules or masses. AORTIC ARCH: There is calcific atherosclerosis of the aortic arch. There is no aneurysm, dissection or hemodynamically significant stenosis of the visualized portion of the aorta. Conventional 3 vessel aortic branching pattern. The visualized proximal subclavian arteries are widely patent. RIGHT CAROTID SYSTEM: No dissection, occlusion or aneurysm. There is predominantly calcified atherosclerosis extending into the proximal ICA, resulting in 65% stenosis. LEFT CAROTID SYSTEM: No dissection, occlusion or aneurysm. There is calcified atherosclerosis extending into  the proximal ICA, resulting in 60% stenosis. VERTEBRAL ARTERIES: Codominant configuration. Both origins are clearly patent. There is no dissection, occlusion or flow-limiting stenosis to the skull base (V1-V3 segments). CTA HEAD FINDINGS POSTERIOR CIRCULATION: --Vertebral arteries: Normal V4 segments. --Inferior cerebellar arteries: Normal. --Basilar artery: Normal. --Superior cerebellar arteries: Normal. --Posterior cerebral arteries (PCA): Normal. ANTERIOR CIRCULATION: --Intracranial internal carotid arteries: Normal. --Anterior cerebral arteries (ACA): Normal. Both A1 segments are present. Patent anterior communicating artery (a-comm). --Middle cerebral arteries (MCA): Normal. VENOUS SINUSES: As permitted by contrast timing, patent. ANATOMIC VARIANTS: None Review of the MIP images confirms the above findings. IMPRESSION: 1. No intracranial arterial occlusion or high-grade stenosis. 2. Hyperenhancing left  paraclinoid mass that surrounds the cavernous segment of the left internal carotid artery and extends around the left dorsal aspect of the clivus, most consistent with meningioma. 3. Hyperenhancing left neck mass at the inferior aspect of the left parotid gland, measuring 3.2 x 1.9 x 4.4 cm, possibly a primary parotid neoplasm. ENT consultation and histologic sampling should be considered. 4. Bilateral carotid bifurcation atherosclerosis resulting in bilateral proximal ICA stenosis, measuring 65% on the right and 60% on the left. 5. Incidental heterogeneous and enlarged thyroid. Recommend thyroid US if clinically warranted given patient age. Reference: J Am Coll Radiol. 2015 Feb;12(2): 143-50 Aortic Atherosclerosis (ICD10-I70.0). Electronically Signed   By: Ulyses Jarred M.D.   On: 12/31/2020 02:27     The results of significant diagnostics from this hospitalization (including imaging, microbiology, ancillary and laboratory) are listed below for reference.     Microbiology: Recent Results (from the past  240 hour(s))  Resp Panel by RT-PCR (Flu A&B, Covid) Nasopharyngeal Swab     Status: None   Collection Time: 12/31/20 12:49 AM   Specimen: Nasopharyngeal Swab; Nasopharyngeal(NP) swabs in vial transport medium  Result Value Ref Range Status   SARS Coronavirus 2 by RT PCR NEGATIVE NEGATIVE Final    Comment: (NOTE) SARS-CoV-2 target nucleic acids are NOT DETECTED.  The SARS-CoV-2 RNA is generally detectable in upper respiratory specimens during the acute phase of infection. The lowest concentration of SARS-CoV-2 viral copies this assay can detect is 138 copies/mL. A negative result does not preclude SARS-Cov-2 infection and should not be used as the sole basis for treatment or other patient management decisions. A negative result may occur with  improper specimen collection/handling, submission of specimen other than nasopharyngeal swab, presence of viral mutation(s) within the areas targeted by this assay, and inadequate number of viral copies(<138 copies/mL). A negative result must be combined with clinical observations, patient history, and epidemiological information. The expected result is Negative.  Fact Sheet for Patients:  EntrepreneurPulse.com.au  Fact Sheet for Healthcare Providers:  IncredibleEmployment.be  This test is no t yet approved or cleared by the [place] FDA and  has been authorized for detection and/or diagnosis of SARS-CoV-2 by FDA under an Emergency Use Authorization (EUA). This EUA will remain  in effect (meaning this test can be used) for the duration of the COVID-19 declaration under Section 564(b)(1) of the Act, 21 U.S.C.section 360bbb-3(b)(1), unless the authorization is terminated  or revoked sooner.       Influenza A by PCR NEGATIVE NEGATIVE Final   Influenza B by PCR NEGATIVE NEGATIVE Final    Comment: (NOTE) The Xpert Xpress SARS-CoV-2/FLU/RSV plus assay is intended as an aid in the diagnosis of influenza  from Nasopharyngeal swab specimens and should not be used as a sole basis for treatment. Nasal washings and aspirates are unacceptable for Xpert Xpress SARS-CoV-2/FLU/RSV testing.  Fact Sheet for Patients: EntrepreneurPulse.com.au  Fact Sheet for Healthcare Providers: IncredibleEmployment.be  This test is not yet approved or cleared by the [place] FDA and has been authorized for detection and/or diagnosis of SARS-CoV-2 by FDA under an Emergency Use Authorization (EUA). This EUA will remain in effect (meaning this test can be used) for the duration of the COVID-19 declaration under Section 564(b)(1) of the Act, 21 U.S.C. section 360bbb-3(b)(1), unless the authorization is terminated or revoked.  Performed at [facility], [place] [address] [place],  [address]      Labs: BNP (last 3 results) No results for input(s): BNP in the last  8760 hours. Basic Metabolic Panel: Recent Labs  Lab 12/30/20 0100 12/31/20 0057 12/31/20 0624  NA 135 137  --   K 3.6 3.6  --   CL 102 100  --   CO2 27  --   --   GLUCOSE 111* 108*  --   BUN 17 19  --   CREATININE 1.11* 1.00 0.97  CALCIUM 8.1*  --   --    Liver Function Tests: Recent Labs  Lab 12/30/20 0100  AST 17  ALT 13  ALKPHOS 65  BILITOT 0.9  PROT 6.2*  ALBUMIN 2.8*   No results for input(s): LIPASE, AMYLASE in the last 168 hours. No results for input(s): AMMONIA in the last 168 hours. CBC: Recent Labs  Lab 12/30/20 0100 12/31/20 0057 12/31/20 0624  WBC 7.8  --  5.8  NEUTROABS 5.1  --   --   HGB 12.2 13.6 11.7*  HCT 37.6 40.0 35.4*  MCV 94.7  --  94.7  PLT 240  --  205   Cardiac Enzymes: No results for input(s): CKTOTAL, CKMB, CKMBINDEX, TROPONINI in the last 168 hours. BNP: Invalid input(s): POCBNP CBG: No results for input(s): GLUCAP in the last 168 hours. D-Dimer No results for input(s): DDIMER in the last 72 hours. Hgb A1c Recent Labs    12/31/20 0624   HGBA1C 5.8*   Lipid Profile Recent Labs    12/31/20 0624  CHOL 132  HDL 49  LDLCALC 74  TRIG 47  CHOLHDL 2.7   Thyroid function studies No results for input(s): TSH, T4TOTAL, T3FREE, THYROIDAB in the last 72 hours.  Invalid input(s): FREET3 Anemia work up No results for input(s): VITAMINB12, FOLATE, FERRITIN, TIBC, IRON, RETICCTPCT in the last 72 hours. Urinalysis    Component Value Date/Time   COLORURINE YELLOW 12/31/2020 0100   APPEARANCEUR CLEAR 12/31/2020 0100   LABSPEC 1.020 12/31/2020 0100   PHURINE 6.0 12/31/2020 0100   GLUCOSEU NEGATIVE 12/31/2020 0100   HGBUR NEGATIVE 12/31/2020 0100   BILIRUBINUR NEGATIVE 12/31/2020 0100   KETONESUR NEGATIVE 12/31/2020 0100   PROTEINUR NEGATIVE 12/31/2020 0100   NITRITE POSITIVE (A) 12/31/2020 0100   LEUKOCYTESUR NEGATIVE 12/31/2020 0100   Sepsis Labs Invalid input(s): PROCALCITONIN,  WBC,  LACTICIDVEN Microbiology Recent Results (from the past 240 hour(s))  Resp Panel by RT-PCR (Flu A&B, Covid) Nasopharyngeal Swab     Status: None   Collection Time: 12/31/20 12:49 AM   Specimen: Nasopharyngeal Swab; Nasopharyngeal(NP) swabs in vial transport medium  Result Value Ref Range Status   SARS Coronavirus 2 by RT PCR NEGATIVE NEGATIVE Final    Comment: (NOTE) SARS-CoV-2 target nucleic acids are NOT DETECTED.  The SARS-CoV-2 RNA is generally detectable in upper respiratory specimens during the acute phase of infection. The lowest concentration of SARS-CoV-2 viral copies this assay can detect is 138 copies/mL. A negative result does not preclude SARS-Cov-2 infection and should not be used as the sole basis for treatment or other patient management decisions. A negative result may occur with  improper specimen collection/handling, submission of specimen other than nasopharyngeal swab, presence of viral mutation(s) within the areas targeted by this assay, and inadequate number of viral copies(<138 copies/mL). A negative result  must be combined with clinical observations, patient history, and epidemiological information. The expected result is Negative.  Fact Sheet for Patients:  EntrepreneurPulse.com.au  Fact Sheet for Healthcare Providers:  IncredibleEmployment.be  This test is no t yet approved or cleared by the [place] and  has been authorized for  detection and/or diagnosis of SARS-CoV-2 by FDA under an Emergency Use Authorization (EUA). This EUA will remain  in effect (meaning this test can be used) for the duration of the COVID-19 declaration under Section 564(b)(1) of the Act, 21 U.S.C.section 360bbb-3(b)(1), unless the authorization is terminated  or revoked sooner.       Influenza A by PCR NEGATIVE NEGATIVE Final   Influenza B by PCR NEGATIVE NEGATIVE Final    Comment: (NOTE) The Xpert Xpress SARS-CoV-2/FLU/RSV plus assay is intended as an aid in the diagnosis of influenza from Nasopharyngeal swab specimens and should not be used as a sole basis for treatment. Nasal washings and aspirates are unacceptable for Xpert Xpress SARS-CoV-2/FLU/RSV testing.  Fact Sheet for Patients: EntrepreneurPulse.com.au  Fact Sheet for Healthcare Providers: IncredibleEmployment.be  This test is not yet approved or cleared by the [place] FDA and has been authorized for detection and/or diagnosis of SARS-CoV-2 by FDA under an Emergency Use Authorization (EUA). This EUA will remain in effect (meaning this test can be used) for the duration of the COVID-19 declaration under Section 564(b)(1) of the Act, 21 U.S.C. section 360bbb-3(b)(1), unless the authorization is terminated or revoked.  Performed at [facility], [place] [address] [place], [place] [address]      Time coordinating discharge:  I have spent 35 minutes face to face with the patient and on the ward discussing the patients care, assessment, plan and  disposition with other care givers. >50% of the time was devoted counseling the patient about the risks and benefits of treatment/Discharge disposition and coordinating care.   SIGNED:   Damita Lack, MD  [facility] 12/31/2020, 12:33 PM   If 7PM-7AM, please contact night-coverage

## 2020-12-31 NOTE — ED Notes (Signed)
Contacted admitting RN. 

## 2020-12-31 NOTE — Progress Notes (Signed)
°  Echocardiogram 2D Echocardiogram has been performed.  Felicia Benson 12/31/2020, 8:36 AM

## 2020-12-31 NOTE — ED Provider Notes (Signed)
I assumed care of this patient.  Please see previous provider note for further details of Hx, PE.  Briefly patient is a 87 y.o. female who presented with aphasia concerning for TIA.  Seen by neurology who recommended admission for TIA/stroke work-up. Awaiting labs and imaging.   Work-up notable for urinary tract infection.  IV Rocephin given.  CTA: IMPRESSION:  1. No intracranial arterial occlusion or high-grade stenosis.  2. Hyperenhancing left paraclinoid mass that surrounds the cavernous  segment of the left internal carotid artery and extends around the  left dorsal aspect of the clivus, most consistent with meningioma.  3. Hyperenhancing left neck mass at the inferior aspect of the left  parotid gland, measuring 3.2 x 1.9 x 4.4 cm, possibly a primary  parotid neoplasm. ENT consultation and histologic sampling should be  considered.  4. Bilateral carotid bifurcation atherosclerosis resulting in  bilateral proximal ICA stenosis, measuring 65% on the right and 60%  on the left.  5. Incidental heterogeneous and enlarged thyroid. Recommend thyroid  US if clinically warranted given patient age.  Reference: J Am Coll Radiol. 2015 Feb;12(2): 143-50   Admitted to medicine   Tally Mattox, Amadeo Garnet, MD 12/31/20 [phone]

## 2020-12-31 NOTE — Progress Notes (Signed)
SLP Cancellation Note  Patient Details Name: Felicia Benson MRN: [id number] DOB: 1930/12/08   Cancelled treatment:       Reason Eval/Treat Not Completed: SLP screened, no needs identified, will sign off   Giorgio Chabot, Riley Nearing 12/31/2020, 10:09 AM

## 2020-12-31 NOTE — ED Notes (Signed)
PT at bedside.

## 2020-12-31 NOTE — ED Notes (Signed)
Patient transported to CT 

## 2020-12-31 NOTE — Evaluation (Signed)
Physical Therapy Evaluation & Discharge Patient Details Name: Felicia Benson MRN: [id number] DOB: [birth year] Today's Date: 12/31/2020  History of Present Illness  This 87 y.o. female presented to ED 12/30/20 with difficulty speaking. CT/CTA negative for acute abnormality or LVO. MRI pending. Possible TIA. UA shows features concerning for UTI. PMH includes: AAA, CHF, Chrohn's disease, Diverticulitis, GI bleed, HTN, OSA, s/p pacemaker   Clinical Impression  Pt presents with condition above. PTA, she was living with her family in a 1-level house with a level entry, using a [place] or RW intermittently due to LBP. She uses 2L of supplemental O2 via [place] PRN, primarily when ambulating long distances. Currently, pt appears back to her baseline, ambulating without UE support. She did display x1 LOB when challenging her vestibular system when ambulating, but she reports this is her normal. Educated pt to have lights on when getting up in the night or when ambulating on uneven surfaces at night for safety purposes. She does have a hx of bil hip issues (L>R), impacting her hips strength, but otherwise displays intact lower extremity strength, coordination, and sensation. No further acute PT needs identified, will sign off. All education completed and questions answered.     Recommendations for follow up therapy are one component of a multi-disciplinary discharge planning process, led by the attending physician.  Recommendations may be updated based on patient status, additional functional criteria and insurance authorization.  Follow Up Recommendations No PT follow up    Assistance Recommended at Discharge PRN  Functional Status Assessment Patient has not had a recent decline in their functional status  Equipment Recommendations  None recommended by PT    Recommendations for Other Services       Precautions / Restrictions Precautions Precautions: Other (comment) Precaution Comments: monitor  sats Restrictions Weight Bearing Restrictions: No      Mobility  Bed Mobility Overal bed mobility: Needs Assistance Bed Mobility: Sit to Supine       Sit to supine: Min assist;HOB elevated   General bed mobility comments: Slight minA to elevate second leg onto elevated stretcher, but otherwise able to do without assistance.    Transfers Overall transfer level: Modified independent Equipment used: None               General transfer comment: Able to power up to stand from standard chair without assistance or LOB.    Ambulation/Gait Ambulation/Gait assistance: Supervision;Min assist Gait Distance (Feet): 140 Feet (x2 bouts of ~140 ft > ~65 ft) Assistive device: None Gait Pattern/deviations: Step-through pattern;Decreased stride length Gait velocity: reduced Gait velocity interpretation: 1.31 - 2.62 ft/sec, indicative of limited community ambulator   General Gait Details: Pt with mostly steady gait and slightly slowed speed, but reports this is her normal. Pt able to change speeds and turn without LOB, but did display a lateral LOB 1x to R when nodding head up and down, needing minA ot recover. She reports she has a hx of some instability occasionally. Educated pt to have lights on when she gets up at night or avoid uneven surfaces when dark due to her instability when ambulating when challenging her vestibular system.  Stairs            Wheelchair Mobility    Modified Rankin (Stroke Patients Only) Modified Rankin (Stroke Patients Only) Pre-Morbid Rankin Score: No significant disability Modified Rankin: No significant disability     Balance Overall balance assessment: Mild deficits observed, not formally tested  Pertinent Vitals/Pain Pain Assessment: Faces Faces Pain Scale: Hurts little more Pain Location: low back pain with standing Pain Descriptors / Indicators: Discomfort;Guarding Pain  Intervention(s): Limited activity within patient's tolerance;Monitored during session;Repositioned    Home Living Family/patient expects to be discharged to:: Private residence Living Arrangements: Children Available Help at Discharge: Family;Available 24 hours/day Type of Home: House Home Access: Level entry       Home Layout: One level Home Equipment: Conservation officer, nature (2 wheels);Cane - single point;Shower seat;Other (comment) (02 intermittently) Additional Comments: uses 2L O2 intermittently for further mobility    Prior Function Prior Level of Function : Independent/Modified Independent             Mobility Comments: uses SPC or RW intermittently due to LBP ADLs Comments: mod I with ADLs, does not drive due to AMD, likes to cook     Hand Dominance        Extremity/Trunk Assessment   Upper Extremity Assessment Upper Extremity Assessment: Defer to OT evaluation    Lower Extremity Assessment Lower Extremity Assessment: RLE deficits/detail;LLE deficits/detail RLE Deficits / Details: MMT scores of 4 hip flexion, 5 knee extension, 5 ankle dorsiflexion, reports hx of bil hip issues and low back pain; denies numbness/tingling, accurate detection of light touch noted; coordination intact RLE Sensation: WNL RLE Coordination: WNL LLE Deficits / Details: MMT scores of 3+ hip flexion, 5 knee extension, 5 ankle dorsiflexion, reports hx of bil hip issues (L worse than R) and low back pain; denies numbness/tingling, accurate detection of light touch noted; coordination intact LLE Sensation: WNL LLE Coordination: WNL    Cervical / Trunk Assessment Cervical / Trunk Assessment: Other exceptions Cervical / Trunk Exceptions: low back pain hx  Communication   Communication: No difficulties  Cognition Arousal/Alertness: Awake/alert Behavior During Therapy: WFL for tasks assessed/performed Overall Cognitive Status: Within Functional Limits for tasks assessed                                           General Comments General comments (skin integrity, edema, etc.): SpO2 >/= 89% on 2L    Exercises     Assessment/Plan    PT Assessment Patient does not need any further PT services  PT Problem List         PT Treatment Interventions      PT Goals (Current goals can be found in the Care Plan section)  Acute Rehab PT Goals Patient Stated Goal: to go home PT Goal Formulation: All assessment and education complete, DC therapy Time For Goal Achievement: 01/01/21 Potential to Achieve Goals: Good    Frequency     Barriers to discharge        Co-evaluation               AM-PAC PT "6 Clicks" Mobility  Outcome Measure Help needed turning from your back to your side while in a flat bed without using bedrails?: None Help needed moving from lying on your back to sitting on the side of a flat bed without using bedrails?: None Help needed moving to and from a bed to a chair (including a wheelchair)?: None Help needed standing up from a chair using your arms (e.g., wheelchair or bedside chair)?: None Help needed to walk in hospital room?: A Little Help needed climbing 3-5 steps with a railing? : A Little 6 Click Score: 22    End of  Session Equipment Utilized During Treatment: Oxygen Activity Tolerance: Patient tolerated treatment well;Patient limited by pain (low back pain hx) Patient left: in bed;with call bell/phone within reach Nurse Communication: Mobility status PT Visit Diagnosis: Unsteadiness on feet (R26.81);Other abnormalities of gait and mobility (R26.89)    Time: 1505-6979 PT Time Calculation (min) (ACUTE ONLY): 15 min   Charges:   PT Evaluation $PT Eval Low Complexity: 1 Low          Raymond Gurney, PT, DPT Acute Rehabilitation Services  Pager: [phone] Office: [phone]   Jewel Baize 12/31/2020, 9:38 AM

## 2020-12-31 NOTE — H&P (Addendum)
History and Physical    Felicia Benson [id number] DOB: Oct 13, 1930 DOA: 12/30/2020  PCP: System, Provider Not In  Patient coming from: Home.  Chief Complaint: Difficulty speaking.  HPI: Felicia Benson is a 87 y.o. female with history of diastolic CHF, hypertension, CAD status post RCA stent, chronic kidney disease stage III, thoracic aortic aneurysm, Crohn's disease who was having difficulty speaking at around 7 PM last evening on December 30, 2020 when patient was with her daughter.  This lasted for around 45 minutes.  EMS was called and by the time EMS reach patient's symptoms resolved.  Patient denies any weakness of the upper or lower extremities any difficulty swallowing or any visual symptoms.  ED Course: In the ER patient had CT head followed by CT angiogram of the head and neck which did not show anything acute or any large vessel obstruction.  Neurology on-call was consulted patient has been admitted for further work-up of stroke.  Labs are largely at baseline.  UA shows features concerning for UTI.  COVID test was negative.  Review of Systems: As per HPI, rest all negative.   Past Medical History:  Diagnosis Date   AAA (abdominal aortic aneurysm)    CHF (congestive heart failure) (HCC)    Crohn's disease (HCC)    Diverticulitis    Diverticulosis    GERD (gastroesophageal reflux disease)    GI bleed    Hypertension    OSA (obstructive sleep apnea)    Pacemaker    Thyroid nodule     Past Surgical History:  Procedure Laterality Date   CHOLECYSTECTOMY     COLONOSCOPY WITH PROPOFOL N/A 06/01/2015   Procedure: COLONOSCOPY WITH PROPOFOL;  Surgeon: Wilford Corner, MD;  Location: [facility];  Service: Endoscopy;  Laterality: N/A;   ESOPHAGOGASTRODUODENOSCOPY (EGD) WITH PROPOFOL N/A 06/01/2015   Procedure: ESOPHAGOGASTRODUODENOSCOPY (EGD) WITH PROPOFOL;  Surgeon: Wilford Corner, MD;  Location: [facility] ENDOSCOPY;  Service: Endoscopy;  Laterality: N/A;   GIVENS CAPSULE STUDY  N/A 06/03/2015   Procedure: GIVENS CAPSULE STUDY;  Surgeon: Wilford Corner, MD;  Location: [facility] ENDOSCOPY;  Service: Endoscopy;  Laterality: N/A;   PACEMAKER PLACEMENT  2015     reports that she has quit smoking. She has never used smokeless tobacco. She reports that she does not drink alcohol and does not use drugs.  Allergies  Allergen Reactions   Nsaids Nausea And Vomiting    Pt has preference to tylonel    Family History  Problem Relation Age of Onset   CVA Mother    Lung cancer Father    Colon cancer Neg Hx     Prior to Admission medications   Medication Sig Start Date End Date Taking? Authorizing Provider  acetaminophen (TYLENOL) 325 MG tablet Take 650 mg by mouth 2 (two) times daily as needed for mild pain or headache.   Yes [provider]  amLODipine (NORVASC) 10 MG tablet Take 1 tablet (10 mg total) by mouth every evening. Hold for next few days 06/04/15  Yes Rai, Ripudeep K, MD  aspirin 81 MG EC tablet Take 1 tablet by mouth daily. 10/18/15  Yes [provider]  atorvastatin (LIPITOR) 10 MG tablet Take 10 mg by mouth daily. 03/07/18  Yes [provider]  Cholecalciferol (VITAMIN D3) 5000 units TABS Take 5,000 Units by mouth daily.    Yes [provider]  cloNIDine (CATAPRES - DOSED IN MG/24 HR) 0.2 mg/24hr patch Place 0.2 mg onto the skin once a week. 12/14/20  Yes [provider]  furosemide (LASIX) 40 MG tablet Take 1 tablet (40 mg total) by mouth daily. May take additional 1 tablet (40 mg) for more swelling, shortness of breath or over 2 lbs weight gain 05/22/19  Yes Gonfa, Charlesetta Ivory, MD  hydroxypropyl methylcellulose / hypromellose (ISOPTO TEARS / GONIOVISC) 2.5 % ophthalmic solution Place 1 drop into both eyes 2 (two) times daily as needed for dry eyes.   Yes [provider]  losartan (COZAAR) 25 MG tablet Take 1 tablet (25 mg total) by mouth daily. 05/21/19  Yes Mercy Riding, MD  mesalamine (PENTASA) 500 MG CR capsule Take  1,500 mg by mouth 2 (two) times daily.   Yes [provider]  metoprolol succinate (TOPROL-XL) 25 MG 24 hr tablet Take 75 mg by mouth 2 (two) times daily. 04/23/15  Yes [provider]  mometasone-formoterol (DULERA) 100-5 MCG/ACT AERO Inhale 2 puffs into the lungs 2 (two) times daily at 8 am and 10 pm.   Yes [provider]  montelukast (SINGULAIR) 10 MG tablet Take 10 mg by mouth daily. 04/05/15  Yes [provider]  multivitamin-lutein (OCUVITE-LUTEIN) CAPS capsule Take 1 capsule by mouth daily.   Yes [provider]  nitroGLYCERIN (NITROSTAT) 0.3 MG SL tablet Place 0.3 mg under the tongue every 5 (five) hours as needed for chest pain.   Yes [provider]  pantoprazole (PROTONIX) 40 MG tablet Take 40 mg by mouth daily. 11/21/20  Yes [provider]  SYMBICORT 160-4.5 MCG/ACT inhaler Inhale 2 puffs into the lungs in the morning and at bedtime. 05/15/19  Yes [provider]  traMADol (ULTRAM) 50 MG tablet Take 50-100 mg by mouth 2 (two) times daily as needed for moderate pain. 11/14/20  Yes [provider]  triamcinolone cream (KENALOG) 0.1 % Apply 1 application topically 2 (two) times daily as needed (yeast infection). Apply to skin under breast tissue   Yes [provider]  esomeprazole (NEXIUM) 40 MG capsule Take 40 mg by mouth daily. Patient not taking: Reported on 12/30/2020 03/21/19   [provider]    Physical Exam: Constitutional: Moderately built and nourished. Vitals:   12/31/20 0113 12/31/20 0130 12/31/20 0303 12/31/20 0307  BP: (!) 145/82 (!) 147/83 (!) 156/86 (!) 156/86  Pulse: 62 60 66 60  Resp: 17 16 19 19   Temp: 97.8 F (36.6 C)     TempSrc: Oral     SpO2: 94% 93% 94% 96%  Weight: 83.9 kg     Height: 5\' 6"  (1.676 m)      Eyes: Anicteric no pallor. ENMT: No discharge from the ears eyes nose and mouth. Neck: No mass felt.  No neck rigidity. Respiratory: No rhonchi or  crepitations. Cardiovascular: S1-S2 heard. Abdomen: Soft nontender bowel sound present. Musculoskeletal: No edema. Skin: Rash. Neurologic: Alert awake oriented to time place and person.  Moves all extremities 5 x 5.  No facial asymmetry tongue is midline.  Pupils are equal and reactive to light. Psychiatric: Appears normal.  Normal affect.   Labs on Admission: I have personally reviewed following labs and imaging studies  CBC: Recent Labs  Lab 12/30/20 0100 12/31/20 0057  WBC 7.8  --   NEUTROABS 5.1  --   HGB 12.2 13.6  HCT 37.6 40.0  MCV 94.7  --   PLT 240  --    Basic Metabolic Panel: Recent Labs  Lab 12/30/20 0100 12/31/20 0057  NA 135 137  K 3.6 3.6  CL 102 100  CO2 27  --   GLUCOSE 111* 108*  BUN 17 19  CREATININE 1.11* 1.00  CALCIUM 8.1*  --    GFR: Estimated Creatinine Clearance: 42.4 mL/min (by C-G formula based on SCr of 1 mg/dL). Liver Function Tests: Recent Labs  Lab 12/30/20 0100  AST 17  ALT 13  ALKPHOS 65  BILITOT 0.9  PROT 6.2*  ALBUMIN 2.8*   No results for input(s): LIPASE, AMYLASE in the last 168 hours. No results for input(s): AMMONIA in the last 168 hours. Coagulation Profile: Recent Labs  Lab 12/30/20 0100  INR 1.0   Cardiac Enzymes: No results for input(s): CKTOTAL, CKMB, CKMBINDEX, TROPONINI in the last 168 hours. BNP (last 3 results) No results for input(s): PROBNP in the last 8760 hours. HbA1C: No results for input(s): HGBA1C in the last 72 hours. CBG: No results for input(s): GLUCAP in the last 168 hours. Lipid Profile: No results for input(s): CHOL, HDL, LDLCALC, TRIG, CHOLHDL, LDLDIRECT in the last 72 hours. Thyroid Function Tests: No results for input(s): TSH, T4TOTAL, FREET4, T3FREE, THYROIDAB in the last 72 hours. Anemia Panel: No results for input(s): VITAMINB12, FOLATE, FERRITIN, TIBC, IRON, RETICCTPCT in the last 72 hours. Urine analysis:    Component Value Date/Time   COLORURINE YELLOW 12/31/2020 0100    APPEARANCEUR CLEAR 12/31/2020 0100   LABSPEC 1.020 12/31/2020 0100   PHURINE 6.0 12/31/2020 0100   GLUCOSEU NEGATIVE 12/31/2020 0100   HGBUR NEGATIVE 12/31/2020 0100   BILIRUBINUR NEGATIVE 12/31/2020 0100   KETONESUR NEGATIVE 12/31/2020 0100   PROTEINUR NEGATIVE 12/31/2020 0100   NITRITE POSITIVE (A) 12/31/2020 0100   LEUKOCYTESUR NEGATIVE 12/31/2020 0100   Sepsis Labs: @LABRCNTIP (procalcitonin:4,lacticidven:4) ) Recent Results (from the past 240 hour(s))  Resp Panel by RT-PCR (Flu A&B, Covid) Nasopharyngeal Swab     Status: None   Collection Time: 12/31/20 12:49 AM   Specimen: Nasopharyngeal Swab; Nasopharyngeal(NP) swabs in vial transport medium  Result Value Ref Range Status   SARS Coronavirus 2 by RT PCR NEGATIVE NEGATIVE Final    Comment: (NOTE) SARS-CoV-2 target nucleic acids are NOT DETECTED.  The SARS-CoV-2 RNA is generally detectable in upper respiratory specimens during the acute phase of infection. The lowest concentration of SARS-CoV-2 viral copies this assay can detect is 138 copies/mL. A negative result does not preclude SARS-Cov-2 infection and should not be used as the sole basis for treatment or other patient management decisions. A negative result may occur with  improper specimen collection/handling, submission of specimen other than nasopharyngeal swab, presence of viral mutation(s) within the areas targeted by this assay, and inadequate number of viral copies(<138 copies/mL). A negative result must be combined with clinical observations, patient history, and epidemiological information. The expected result is Negative.  Fact Sheet for Patients:  EntrepreneurPulse.com.au  Fact Sheet for Healthcare Providers:  IncredibleEmployment.be  This test is no t yet approved or cleared by the [place] FDA and  has been authorized for detection and/or diagnosis of SARS-CoV-2 by FDA under an Emergency Use Authorization (EUA).  This EUA will remain  in effect (meaning this test can be used) for the duration of the COVID-19 declaration under Section 564(b)(1) of the Act, 21 U.S.C.section 360bbb-3(b)(1), unless the authorization is terminated  or revoked sooner.       Influenza A by PCR NEGATIVE NEGATIVE Final   Influenza B by PCR NEGATIVE NEGATIVE Final    Comment: (NOTE) The Xpert Xpress SARS-CoV-2/FLU/RSV plus assay is intended as an aid in the diagnosis of influenza from Nasopharyngeal swab  specimens and should not be used as a sole basis for treatment. Nasal washings and aspirates are unacceptable for Xpert Xpress SARS-CoV-2/FLU/RSV testing.  Fact Sheet for Patients: BloggerCourse.com  Fact Sheet for Healthcare Providers: SeriousBroker.it  This test is not yet approved or cleared by the [place] FDA and has been authorized for detection and/or diagnosis of SARS-CoV-2 by FDA under an Emergency Use Authorization (EUA). This EUA will remain in effect (meaning this test can be used) for the duration of the COVID-19 declaration under Section 564(b)(1) of the Act, 21 U.S.C. section 360bbb-3(b)(1), unless the authorization is terminated or revoked.  Performed at [facility], [address] [place], [place] [address]      Radiological Exams on Admission: CT ANGIO HEAD NECK W WO CM  Result Date: 12/31/2020 CLINICAL DATA:  New onset aphasia and headache EXAM: CT ANGIOGRAPHY HEAD AND NECK TECHNIQUE: Multidetector CT imaging of the head and neck was performed using the standard protocol during bolus administration of intravenous contrast. Multiplanar CT image reconstructions and MIPs were obtained to evaluate the vascular anatomy. Carotid stenosis measurements (when applicable) are obtained utilizing NASCET criteria, using the distal internal carotid diameter as the denominator. CONTRAST:  83mL OMNIPAQUE IOHEXOL 350 MG/ML SOLN COMPARISON:  None.  FINDINGS: CT HEAD FINDINGS Brain: There is a left paraclinoid mass that surrounds the cavernous segment of the left internal carotid artery. The mass extends around the left dorsal aspect of the clivus. The size and configuration of the ventricles and extra-axial CSF spaces are normal. There is hypoattenuation of the periventricular white matter, most commonly indicating chronic ischemic microangiopathy. Skull: The visualized skull base, calvarium and extracranial soft tissues are normal. Sinuses/Orbits: No fluid levels or advanced mucosal thickening of the visualized paranasal sinuses. No mastoid or middle ear effusion. The orbits are normal. CTA NECK FINDINGS SKELETON: There is no bony spinal canal stenosis. No lytic or blastic lesion. OTHER NECK: Heterogeneous and enlarged thyroid gland. Hyperenhancing left neck mass at the inferior aspect of the left parotid gland, measuring 3.2 x 1.9 x 4.4 cm. UPPER CHEST: No pneumothorax or pleural effusion. No nodules or masses. AORTIC ARCH: There is calcific atherosclerosis of the aortic arch. There is no aneurysm, dissection or hemodynamically significant stenosis of the visualized portion of the aorta. Conventional 3 vessel aortic branching pattern. The visualized proximal subclavian arteries are widely patent. RIGHT CAROTID SYSTEM: No dissection, occlusion or aneurysm. There is predominantly calcified atherosclerosis extending into the proximal ICA, resulting in 65% stenosis. LEFT CAROTID SYSTEM: No dissection, occlusion or aneurysm. There is calcified atherosclerosis extending into the proximal ICA, resulting in 60% stenosis. VERTEBRAL ARTERIES: Codominant configuration. Both origins are clearly patent. There is no dissection, occlusion or flow-limiting stenosis to the skull base (V1-V3 segments). CTA HEAD FINDINGS POSTERIOR CIRCULATION: --Vertebral arteries: Normal V4 segments. --Inferior cerebellar arteries: Normal. --Basilar artery: Normal. --Superior cerebellar  arteries: Normal. --Posterior cerebral arteries (PCA): Normal. ANTERIOR CIRCULATION: --Intracranial internal carotid arteries: Normal. --Anterior cerebral arteries (ACA): Normal. Both A1 segments are present. Patent anterior communicating artery (a-comm). --Middle cerebral arteries (MCA): Normal. VENOUS SINUSES: As permitted by contrast timing, patent. ANATOMIC VARIANTS: None Review of the MIP images confirms the above findings. IMPRESSION: 1. No intracranial arterial occlusion or high-grade stenosis. 2. Hyperenhancing left paraclinoid mass that surrounds the cavernous segment of the left internal carotid artery and extends around the left dorsal aspect of the clivus, most consistent with meningioma. 3. Hyperenhancing left neck mass at the inferior aspect of the left parotid gland, measuring  3.2 x 1.9 x 4.4 cm, possibly a primary parotid neoplasm. ENT consultation and histologic sampling should be considered. 4. Bilateral carotid bifurcation atherosclerosis resulting in bilateral proximal ICA stenosis, measuring 65% on the right and 60% on the left. 5. Incidental heterogeneous and enlarged thyroid. Recommend thyroid US if clinically warranted given patient age. Reference: J Am Coll Radiol. 2015 Feb;12(2): 143-50 Aortic Atherosclerosis (ICD10-I70.0). Electronically Signed   By: Ulyses Jarred M.D.   On: 12/31/2020 02:27    EKG: Independently reviewed.  Sinus rhythm  Assessment/Plan Principal Problem:   TIA (transient ischemic attack) Active Problems:   Hypertension   Crohn disease ([place])   COPD without exacerbation (HCC)   Pacemaker   AAA (abdominal aortic aneurysm)    TIA -appreciate neurology consult.  Patient has passed stroke swallow.  We will keep patient on neurochecks.  CT angiogram of the head and neck does show proximal ICA stenosis 65% of the right and 60% of the left for which we will await neurology recommendations.  MRI brain noted patient does have pacemaker.  Check hemoglobin A1c lipid  panel patient on antiplatelet agents.  Physical therapy consult. Hypertension allow for permissive hypertension as recommended by neurologist.  As needed IV hydralazine for systolic blood pressure more than [id number] and diastolic more than [id number]. COPD on home oxygen presently not wheezing. Chronic kidney disease stage III creatinine appears to be at baseline. Left parotid tumor and enlarged thyroid.  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  Thyroid enlargement should be worked as outpatient. Possible UTI on ceftriaxone follow urine cultures. Thoracic aortic aneurysm descending denies any chest pain.  Being followed by cardiologist. Crohn's disease on Pentasa.  Presently asymptomatic. History of diastolic CHF appears compensated holding Lasix for allowing for permissive hypertension.  Closely follow respiratory status. Meningioma seen in the CT scan will need follow-up.   DVT prophylaxis: Lovenox. Code Status: Full code. Family Communication: Discussed with patient. Disposition Plan: Home. Consults called: Neurology. Admission status: Observation.   Rise Patience MD [facility] Pager [phone].  If 7PM-7AM, please contact night-coverage [email or web address] Password [email or web address]  12/31/2020, 5:15 AM

## 2020-12-31 NOTE — Plan of Care (Signed)

## 2020-12-31 NOTE — Evaluation (Signed)
Occupational Therapy Evaluation Patient Details Name: Felicia Benson MRN: [id number] DOB: Aug 21, 1930 Today's Date: 12/31/2020   History of Present Illness This 87 y.o. female presented to ED 12/30/20 with difficulty speaking. CT/CTA negative for acute abnormality or LVO. MRI pending. Possible TIA. UA shows features concerning for UTI. PMH includes: AAA, CHF, Chrohn's disease, Diverticulitis, GI bleed, HTN, OSA, s/p pacemaker   Clinical Impression   Patient evaluated by Occupational Therapy with no further acute OT needs identified. All education has been completed and the patient has no further questions. Pt appears to be back to baseline and is able to complete ADLs mod I.  See below for any follow-up Occupational Therapy or equipment needs. OT is signing off. Thank you for this referral.       Recommendations for follow up therapy are one component of a multi-disciplinary discharge planning process, led by the attending physician.  Recommendations may be updated based on patient status, additional functional criteria and insurance authorization.   Follow Up Recommendations  No OT follow up    Assistance Recommended at Discharge None  Functional Status Assessment  Patient has not had a recent decline in their functional status  Equipment Recommendations  None recommended by OT    Recommendations for Other Services       Precautions / Restrictions Precautions Precautions: Other (comment) Precaution Comments: monitor sats Restrictions Weight Bearing Restrictions: No      Mobility Bed Mobility Overal bed mobility: Needs Assistance Bed Mobility: Sit to Supine     Supine to sit: Modified independent (Device/Increase time) Sit to supine: Min assist;HOB elevated   General bed mobility comments: Slight minA to elevate second leg onto elevated stretcher, but otherwise able to do without assistance.    Transfers Overall transfer level: Modified independent Equipment used:  None               General transfer comment: Able to power up to stand from standard chair without assistance or LOB.      Balance Overall balance assessment: Mild deficits observed, not formally tested                                         ADL either performed or assessed with clinical judgement   ADL Overall ADL's : At baseline;Modified independent                                       General ADL Comments: pt able to perform simulated ADLs mod I     Vision Baseline Vision/History: 1 Wears glasses Ability to See in Adequate Light: 0 Adequate Patient Visual Report: No change from baseline Vision Assessment?: Yes Eye Alignment: Within Functional Limits Ocular Range of Motion: Within Functional Limits Alignment/Gaze Preference: Within Defined Limits Tracking/Visual Pursuits: Able to track stimulus in all quads without difficulty Visual Fields: No apparent deficits     Perception     Praxis      Pertinent Vitals/Pain Pain Assessment: Faces Faces Pain Scale: Hurts little more Pain Location: low back pain with standing Pain Descriptors / Indicators: Discomfort;Guarding Pain Intervention(s): Limited activity within patient's tolerance;Monitored during session;Repositioned     Hand Dominance Right   Extremity/Trunk Assessment Upper Extremity Assessment Upper Extremity Assessment: Defer to OT evaluation   Lower Extremity Assessment Lower Extremity Assessment: RLE deficits/detail;LLE deficits/detail  RLE Deficits / Details: MMT scores of 4 hip flexion, 5 knee extension, 5 ankle dorsiflexion, reports hx of bil hip issues and low back pain; denies numbness/tingling, accurate detection of light touch noted; coordination intact RLE Sensation: WNL RLE Coordination: WNL LLE Deficits / Details: MMT scores of 3+ hip flexion, 5 knee extension, 5 ankle dorsiflexion, reports hx of bil hip issues (L worse than R) and low back pain; denies  numbness/tingling, accurate detection of light touch noted; coordination intact LLE Sensation: WNL LLE Coordination: WNL   Cervical / Trunk Assessment Cervical / Trunk Assessment: Other exceptions Cervical / Trunk Exceptions: low back pain hx   Communication Communication Communication: No difficulties   Cognition Arousal/Alertness: Awake/alert Behavior During Therapy: WFL for tasks assessed/performed Overall Cognitive Status: Within Functional Limits for tasks assessed                                 General Comments: able to recall details of admission, tests performed, discussions with providers in detail     General Comments  SpO2 >/= 89% on 2L    Exercises     Shoulder Instructions      Home Living Family/patient expects to be discharged to:: Private residence Living Arrangements: Children Available Help at Discharge: Family;Available 24 hours/day Type of Home: House Home Access: Level entry     Home Layout: One level     Bathroom Shower/Tub: Occupational psychologist: Handicapped height     Home Equipment: Conservation officer, nature (2 wheels);Cane - single point;Shower seat;Other (comment) (02 intermittently)   Additional Comments: uses 2L O2 intermittently for further mobility      Prior Functioning/Environment Prior Level of Function : Independent/Modified Independent             Mobility Comments: uses SPC or RW intermittently due to LBP ADLs Comments: mod I with ADLs, does not drive due to AMD, likes to cook        OT Problem List: Impaired balance (sitting and/or standing)      OT Treatment/Interventions:      OT Goals(Current goals can be found in the care plan section) Acute Rehab OT Goals Patient Stated Goal: to go home for Christmas  OT Frequency:     Barriers to D/C:            Co-evaluation              AM-PAC OT "6 Clicks" Daily Activity     Outcome Measure Help from another person eating meals?:  None Help from another person taking care of personal grooming?: None Help from another person toileting, which includes using toliet, bedpan, or urinal?: None Help from another person bathing (including washing, rinsing, drying)?: None Help from another person to put on and taking off regular upper body clothing?: None Help from another person to put on and taking off regular lower body clothing?: None 6 Click Score: 24   End of Session Equipment Utilized During Treatment: Gait belt Nurse Communication: Mobility status  Activity Tolerance: Patient tolerated treatment well Patient left: in bed;with call bell/phone within reach  OT Visit Diagnosis: Unsteadiness on feet (R26.81)                Time: [id number] OT Time Calculation (min): 21 min Charges:  OT General Charges $OT Visit: 1 Visit OT Evaluation $OT Eval Low Complexity: 1 Low  Wealthy Danielski C., OTR/L Acute Rehabilitation Services Pager [phone] Office  [phone]   Jeani Hawking M 12/31/2020, 9:59 AM

## 2020-12-31 NOTE — ED Notes (Signed)
[employer] Pacemaker Interrogated 

## 2021-01-06 ENCOUNTER — Other Ambulatory Visit (HOSPITAL_COMMUNITY): Payer: Self-pay

## 2021-01-11 ENCOUNTER — Other Ambulatory Visit: Payer: Self-pay

## 2021-01-11 ENCOUNTER — Ambulatory Visit (HOSPITAL_COMMUNITY)
Admission: RE | Admit: 2021-01-11 | Discharge: 2021-01-11 | Disposition: A | Discharge status: Home / Self Care | Payer: Medicare Other | Source: Ambulatory Visit | Attending: Internal Medicine | Admitting: Internal Medicine

## 2021-01-11 DIAGNOSIS — G459 Transient cerebral ischemic attack, unspecified: Secondary | ICD-10-CM | POA: Diagnosis present

## 2021-01-11 IMAGING — MR MR HEAD W/O CM
8 of 10 series · 34 of 48 positions shown · non-contrast
Comparison: CT angiogram head/neck [DATE].

CLINICAL DATA: Provided history: TIA (transient ischemic attack).

EXAM:
MRI HEAD WITHOUT CONTRAST
TECHNIQUE: Multiplanar, multiecho pulse sequences of the brain and surrounding
structures were obtained without intravenous contrast.

[Series 3: DWI · axial · 3.0mm · 1.09mm/px · z∈[-74,+79]mm · 8 of 106 slices shown (1 of 4)]
[im 1/106]
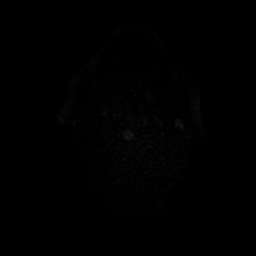
[im 12/106]
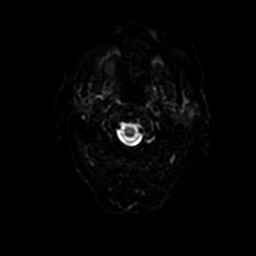
[im 36/106]
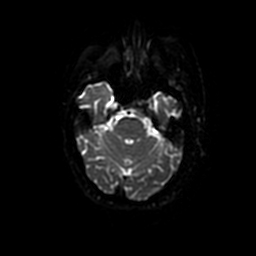
[im 47/106]
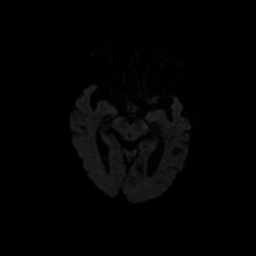
[im 59/106]
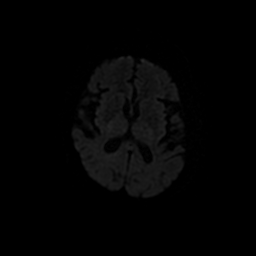
[im 71/106]
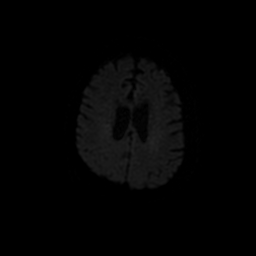
[im 94/106]
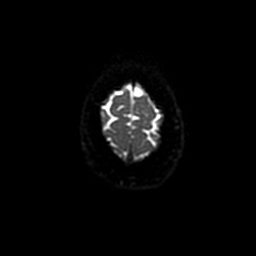
[im 106/106]
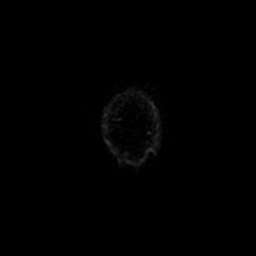

[Series 4: DWI · coronal · 5.0mm · 1.09mm/px · 7 of 74 slices shown (2 of 4)]
[im 1/74]
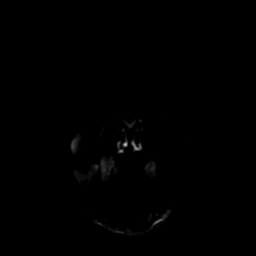
[im 13/74]
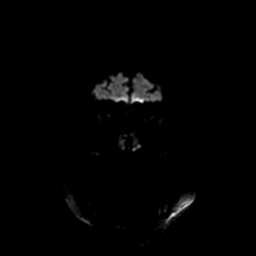
[im 25/74]
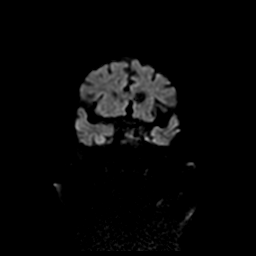
[im 37/74]
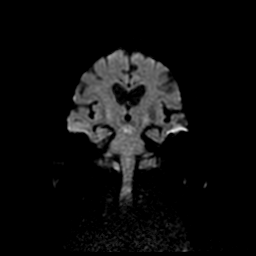
[im 49/74]
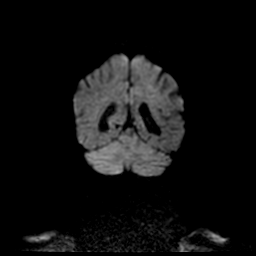
[im 61/74]
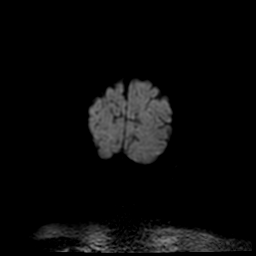
[im 74/74]
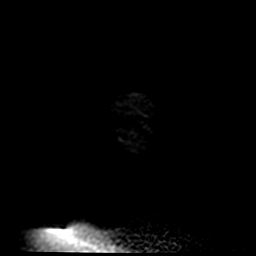

[Series 5: T1 · sagittal · 5.0mm · 0.47mm/px · 2 of 23 slices shown]
[im 1/23]
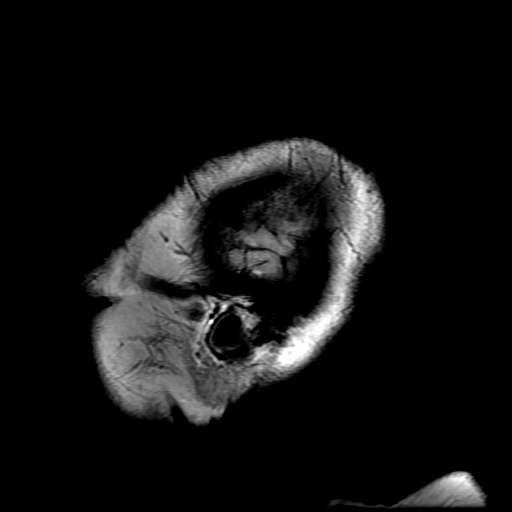
[im 23/23]
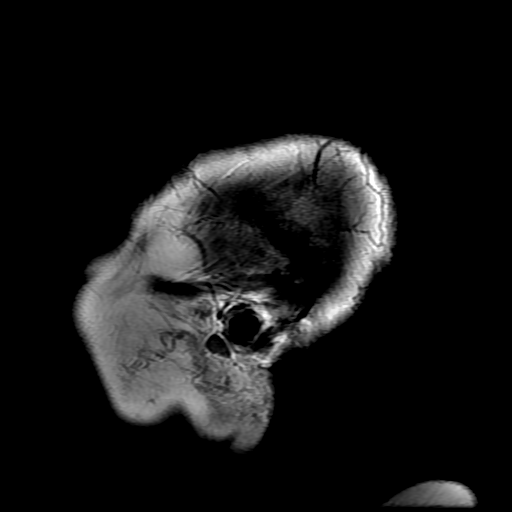

[Series 6: T2 · axial · 5.0mm · 0.43mm/px · z∈[-79,+68]mm · 3 of 26 slices shown (1 of 2)]
[im 1/26]
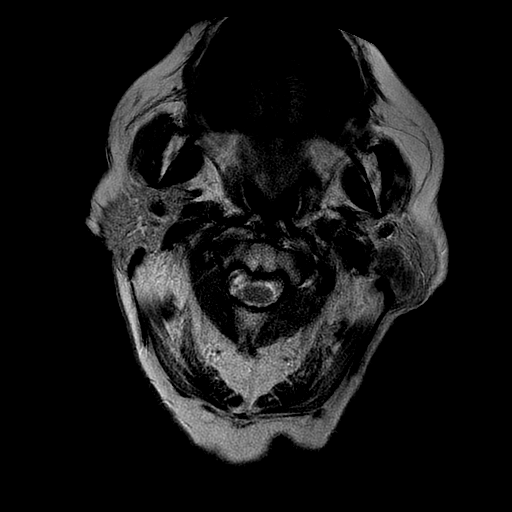
[im 13/26]
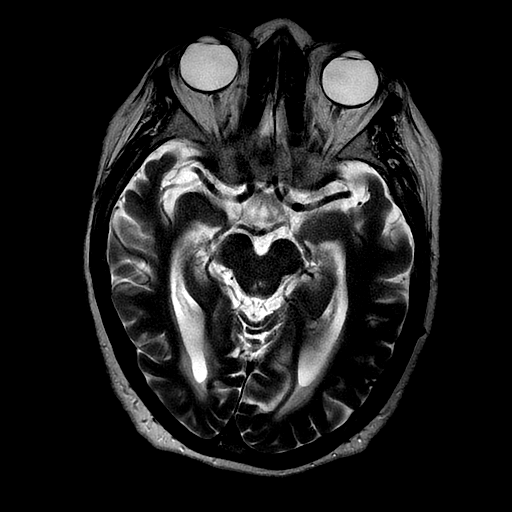
[im 26/26]
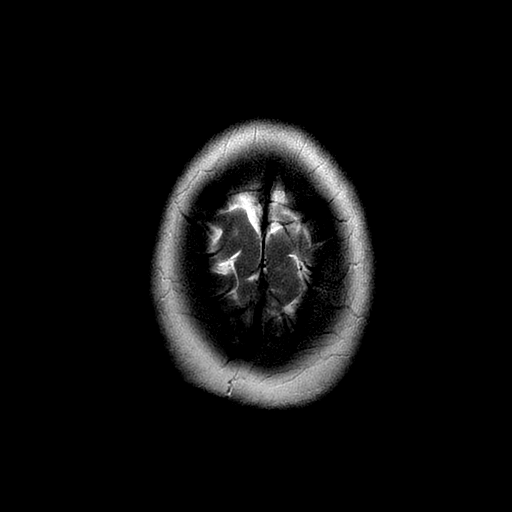

[Series 7: FLAIR · axial · 5.0mm · 0.43mm/px · z∈[-79,+68]mm · 3 of 26 slices shown]
[im 1/26]
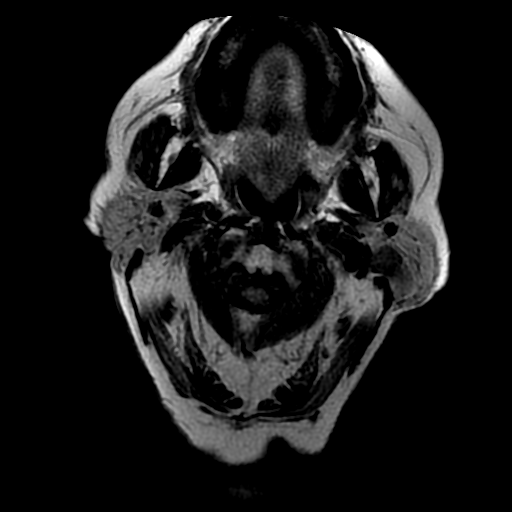
[im 13/26]
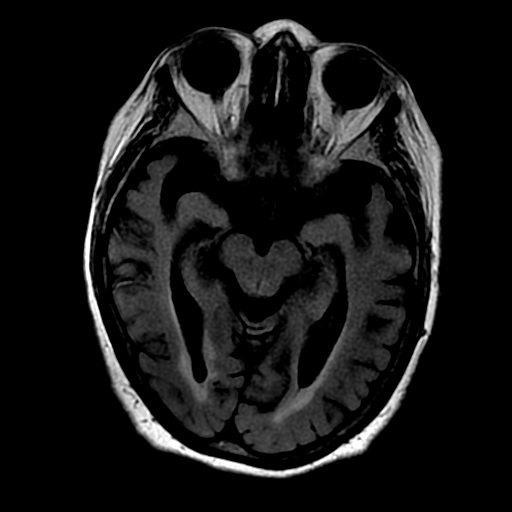
[im 26/26]
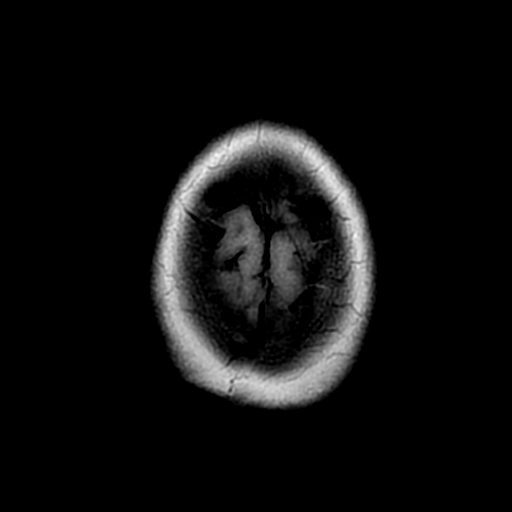

[Series 10: T2 · coronal · 5.0mm · 0.43mm/px · 2 of 24 slices shown (2 of 2)]
[im 1/24]
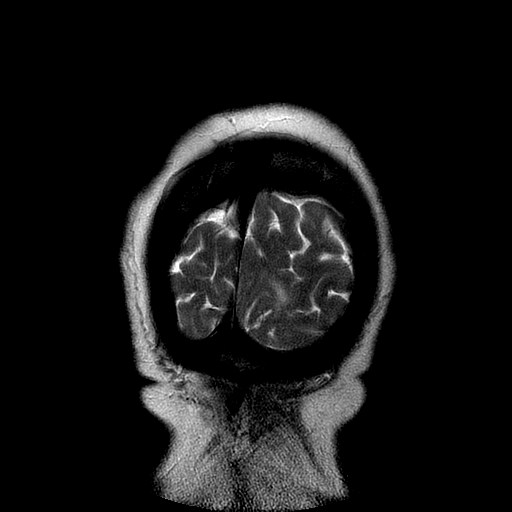
[im 24/24]
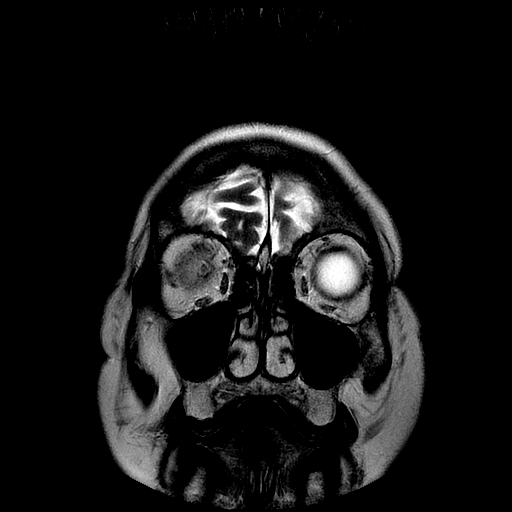

[Series 300: DWI · axial · 3.0mm · 1.09mm/px · z∈[-74,+79]mm · 5 of 53 slices shown (3 of 4)]
[im 1/53]
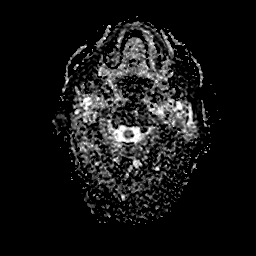
[im 14/53]
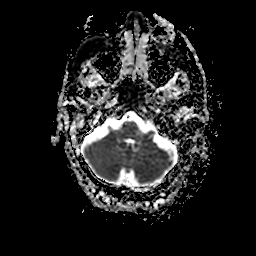
[im 27/53]
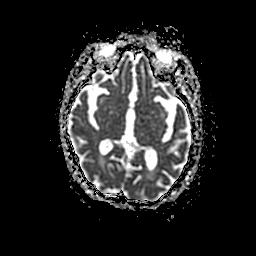
[im 40/53]
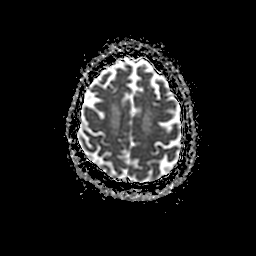
[im 53/53]
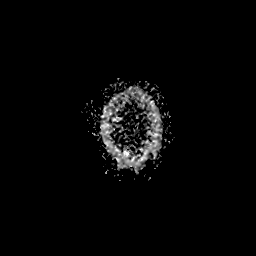

[Series 400: DWI · coronal · 5.0mm · 1.09mm/px · 4 of 37 slices shown (4 of 4)]
[im 1/37]
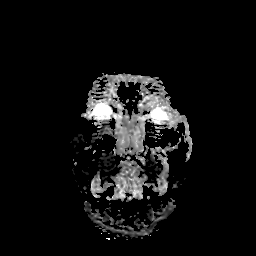
[im 13/37]
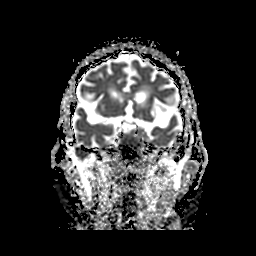
[im 25/37]
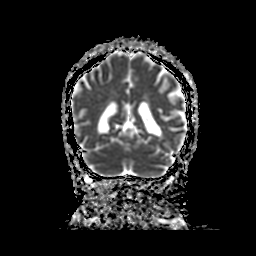
[im 37/37]
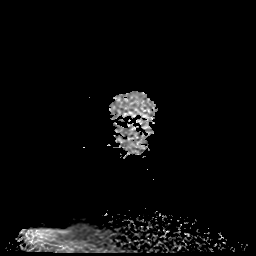

[34 of 48 positions shown; findings below may reference images not displayed]

FINDINGS: Brain:

Mild intermittent motion degradation.

Mild generalized cerebral atrophy.

Extra-axial mass centered within the left cavernous sinus region,
also extending toward the left orbital apex, along the left
petroclinoid ligament and along the left dorsal aspect of the
clivus, likely reflecting a meningioma. This mass was more fully
delineated on the recent prior CTA head of [DATE].

Moderate multifocal T2 FLAIR hyperintense signal abnormality within
the cerebral white matter, nonspecific but compatible with chronic
small vessel ischemic disease.

There is no acute infarct.

No chronic intracranial blood products.

No extra-axial fluid collection.

No midline shift.

Vascular: Maintained flow voids within the proximal large arterial
vessels. Better appreciated on the prior CTA, the left cavernous
sinus region mass likely partially encases the cavernous segment of
the left internal carotid artery.

Skull and upper cervical spine: No focal suspicious marrow lesion.
Susceptibility artifact arising from cervical spinal fusion
hardware. Incompletely assessed cervical spondylosis.

Sinuses/Orbits: Visualized orbits show no acute finding. Frothy
secretions and mild mucosal thickening within the left sphenoid
sinus.

Other: Right mastoid effusion. Incompletely imaged mass along the
inferomedial aspect of the left parotid gland with additional left
intraparotid masses versus enlarged lymph nodes. These findings were
more fully characterized on the recent prior CTA head/neck.
IMPRESSION: 1. No evidence of acute or recent subacute infarction.
2. Extra-axial mass centered within the left cavernous sinus region,
also extending toward the left orbital apex, along the left
petroclinoid ligament and along the left dorsal aspect of the
clivus. This was better appreciated on the recent prior CTA head of
[DATE] and likely reflects a meningioma. There is probable
partial encasement of the cavernous left internal carotid artery.
Consider a contrast-enhanced brain MRI to better delineate the
extent of this mass.
3. Moderate chronic small vessel ischemic changes within the
cerebral white matter.
4. Mild generalized parenchymal atrophy.
5. Redemonstrated, although incompletely imaged, mass along the
inferomedial aspect of the left parotid gland. Additional left
intraparotid masses versus enlarged lymph nodes. These findings were
more fully characterized on the recent prior CTA head/neck. ENT
consultation is again recommended (if not already obtained), and
direct tissue sampling should be considered.
6. Left ethmoid sinusitis.
7. Right mastoid effusion.

## 2021-01-11 NOTE — Progress Notes (Signed)
Per order, Changed device settings for MRI to  ?DOO at 80 bpm  ?Will program device back to pre-MRI settings after completion of exam, and send transmission ?

## 2021-01-25 ENCOUNTER — Telehealth: Payer: Self-pay | Admitting: Pulmonary Disease

## 2021-01-25 NOTE — Telephone Encounter (Signed)
Called the pt and there was no answer- LMTCB   We do have records on this pt that are already in the file cabinet for upcoming consult with AO on 02/13/21.

## 2021-01-30 ENCOUNTER — Other Ambulatory Visit: Payer: Self-pay | Admitting: Internal Medicine

## 2021-01-30 DIAGNOSIS — Z13820 Encounter for screening for osteoporosis: Secondary | ICD-10-CM

## 2021-02-10 ENCOUNTER — Other Ambulatory Visit: Payer: Self-pay | Admitting: Internal Medicine

## 2021-02-10 DIAGNOSIS — Z1231 Encounter for screening mammogram for malignant neoplasm of breast: Secondary | ICD-10-CM

## 2021-02-13 ENCOUNTER — Institutional Professional Consult (permissible substitution): Payer: Medicare Other | Admitting: Pulmonary Disease

## 2021-03-06 ENCOUNTER — Other Ambulatory Visit (HOSPITAL_COMMUNITY): Payer: Self-pay | Admitting: Orthopedic Surgery

## 2021-03-06 DIAGNOSIS — M25561 Pain in right knee: Secondary | ICD-10-CM

## 2021-03-29 ENCOUNTER — Ambulatory Visit (INDEPENDENT_AMBULATORY_CARE_PROVIDER_SITE_OTHER): Payer: Medicare Other | Admitting: Neurology

## 2021-03-29 ENCOUNTER — Encounter: Payer: Self-pay | Admitting: Neurology

## 2021-03-29 ENCOUNTER — Other Ambulatory Visit: Payer: Self-pay

## 2021-03-29 VITALS — BP 126/80 | HR 80 | Ht 66.0 in | Wt 191.0 lb

## 2021-03-29 DIAGNOSIS — R4701 Aphasia: Secondary | ICD-10-CM | POA: Diagnosis not present

## 2021-03-29 DIAGNOSIS — G459 Transient cerebral ischemic attack, unspecified: Secondary | ICD-10-CM

## 2021-03-29 MED ORDER — ASPIRIN EC 81 MG PO TBEC: 81.0000 mg | DELAYED_RELEASE_TABLET | Freq: Every day | ORAL | 11 refills | Status: AC

## 2021-03-29 NOTE — Progress Notes (Signed)
?[facility] ?[address] ?[place]. [place] [address] ?[phone] ? ?     OFFICE CONSULT NOTE ? ?[name] ?Date of Birth:  [birth year] ?Medical Record Number:  [id number]  ? ?Referring MD:  Gerlean Ren ? ?Reason for Referral:  Aphasia ? ?HPI: Felicia Benson is a pleasant 89 year old Caucasian lady with past medical history of hypertension, diastolic congestive heart failure, coronary artery disease s/p RCA stent, chronic kidney disease stage III, thoracic aortic aneurysm, Crohn's disease.  She presented on 12/30/2020 with sudden onset of speaking difficulty.  She was talking to her daughter and was given her instructions on how to bake a cake when she started using wrong words and her speech became gibberish and she knew what she wanted to say but the words were not coming out the way she wanted.  She could understand everything.  She denies accompanying symptoms in the form of headache, blurred vision, facial droop or extremity weakness or numbness.  Her symptoms lasted around 45 minutes and cleared by the time she reached the hospital.  CT scan of the head was unremarkable and MRI could not be done since she had a pacemaker and technician was not available.  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  2D echo showed ejection fraction of 60 to 65% without cardiac source of embolism.  CT angiograms of the brain and neck did not show any large vessel stenosis or occlusion.  LDL cholesterol was 74 mg percent and hemoglobin A1c was 5.8.  Patient was previously on aspirin 81 and the dose was increased to 325 mg daily.  Patient states she has noticed increased bruising after increasing the dose of aspirin.  She is complaints of mild short-term memory difficulties for several years but these are not progressive.  She is independent in activities of daily living and  manages own affairs.  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  She has had no falls or injuries.  She denies any prior history of atrial fibrillation, syncope, palpitations.  She does have a pacemaker and has an upcoming appointment to see her cardiologist in [place] later this week and hopefully he can interrogate the pacemaker to see if there is any A-fib.  She denies any prior history of strokes, TIAs except this episode.  No history of seizures, loss of consciousness or head injury. ? ?ROS:   ?14 system review of systems is positive for speech difficulty, word substitution, gibberish speech, bruising all other systems negative ? ?PMH:  ?Past Medical History:  ?Diagnosis Date  ? AAA (abdominal aortic aneurysm)   ? CHF (congestive heart failure) ([place])   ? Crohn's disease ([place])   ? Diverticulitis   ? Diverticulosis   ? GERD (gastroesophageal reflux disease)   ? GI bleed   ? Hypertension   ? OSA (obstructive sleep apnea)   ? Pacemaker   ? Thyroid nodule   ? ? ?Social History:  ?Social History  ? ?Socioeconomic History  ? Marital status: Widowed  ?  Spouse name: Not on file  ? Number of children: Not on file  ? Years of education: Not on file  ? Highest education level: Not on file  ?Occupational History  ? Not on file  ?Tobacco Use  ? Smoking status: Former  ? Smokeless tobacco: Never  ?Vaping Use  ?  Vaping Use: Never used  ?Substance and Sexual Activity  ? Alcohol use: No  ?  Comment: Quit in 1985  ? Drug use: No  ? Sexual activity: Not on file  ?Other Topics Concern  ? Not on file  ?Social History Narrative  ? Not on file  ? ?Social Determinants of Health  ? ?Financial Resource Strain: Not on file  ?Food Insecurity: Not on file  ?Transportation Needs: Not on file  ?Physical Activity: Not on file  ?Stress: Not on file  ?Social Connections: Not on file  ?Intimate Partner Violence: Not on file  ? ? ?Medications:   ?Current Outpatient Medications on File Prior to Visit   ?Medication Sig Dispense Refill  ? acetaminophen (TYLENOL) 325 MG tablet Take 650 mg by mouth 2 (two) times daily as needed for mild pain or headache.    ? amLODipine (NORVASC) 10 MG tablet Take 1 tablet (10 mg total) by mouth every evening. Hold for next few days    ? Cholecalciferol (VITAMIN D3) 5000 units TABS Take 5,000 Units by mouth daily.     ? cloNIDine (CATAPRES - DOSED IN MG/24 HR) 0.2 mg/24hr patch Place 0.2 mg onto the skin once a week.    ? DULERA 200-5 MCG/ACT AERO Inhale 1 puff into the lungs 2 (two) times daily.    ? esomeprazole (NEXIUM) 40 MG capsule Take 40 mg by mouth daily.    ? furosemide (LASIX) 40 MG tablet Take 1 tablet (40 mg total) by mouth daily. May take additional 1 tablet (40 mg) for more swelling, shortness of breath or over 2 lbs weight gain 180 tablet 1  ? hydroxypropyl methylcellulose / hypromellose (ISOPTO TEARS / GONIOVISC) 2.5 % ophthalmic solution Place 1 drop into both eyes 2 (two) times daily as needed for dry eyes.    ? losartan (COZAAR) 25 MG tablet Take 1 tablet (25 mg total) by mouth daily. 90 tablet 1  ? mesalamine (PENTASA) 500 MG CR capsule Take 1,500 mg by mouth 2 (two) times daily.    ? metoprolol succinate (TOPROL-XL) 25 MG 24 hr tablet Take 75 mg by mouth 2 (two) times daily.    ? mometasone-formoterol (DULERA) 100-5 MCG/ACT AERO Inhale 2 puffs into the lungs 2 (two) times daily at 8 am and 10 pm.    ? montelukast (SINGULAIR) 10 MG tablet Take 10 mg by mouth daily.    ? multivitamin-lutein (OCUVITE-LUTEIN) CAPS capsule Take 1 capsule by mouth daily.    ? nitroGLYCERIN (NITROSTAT) 0.3 MG SL tablet Place 0.3 mg under the tongue every 5 (five) hours as needed for chest pain.    ? pantoprazole (PROTONIX) 40 MG tablet Take 40 mg by mouth daily.    ? SYMBICORT 160-4.5 MCG/ACT inhaler Inhale 2 puffs into the lungs in the morning and at bedtime.    ? traMADol (ULTRAM) 50 MG tablet Take 50-100 mg by mouth 2 (two) times daily as needed for moderate pain.    ? triamcinolone  cream (KENALOG) 0.1 % Apply 1 application topically 2 (two) times daily as needed (yeast infection). Apply to skin under breast tissue    ? atorvastatin (LIPITOR) 20 MG tablet Take 1 tablet (20 mg total) by mouth daily. 30 tablet 0  ? ?No current facility-administered medications on file prior to visit.  ? ? ?Allergies:   ?Allergies  ?Allergen Reactions  ? Nsaids Nausea And Vomiting  ?  Pt has preference to tylonel  ? ? ?Physical Exam ?General: Mildly obese pleasant elderly  Caucasian lady, seated, in no evident distress ?Head: head normocephalic and atraumatic.   ?Neck: supple with no carotid or supraclavicular bruits ?Cardiovascular: regular rate and rhythm, no murmurs ?Musculoskeletal: no deformity ?Skin:  no rash/petichiae ?Vascular:  Normal pulses all extremities ? ?Neurologic Exam ?Mental Status: Awake and fully alert. Oriented to place and time. Recent and remote memory intact. Attention span, concentration and fund of knowledge appropriate. Mood and affect appropriate.  ?Cranial Nerves: Fundoscopic exam reveals sharp disc margins. Pupils equal, briskly reactive to light. Extraocular movements full without nystagmus. Visual fields full to confrontation. Hearing intact. Facial sensation intact. Face, tongue, palate moves normally and symmetrically.  ?Motor: Normal bulk and tone. Normal strength in all tested extremity muscles. ?Sensory.: intact to touch , pinprick , position and vibratory sensation.  ?Coordination: Rapid alternating movements normal in all extremities. Finger-to-nose and heel-to-shin performed accurately bilaterally. ?Gait and Station: Arises from chair without difficulty. Stance is normal.  Uses a wheeled walker.  Slight favoring of her knees due to pain.  Gait demonstrates normal stride length and balance .  Tandem walking not tested ?Reflexes: 1+ and symmetric. Toes downgoing.  ? ?NIHSS  0 ?Modified Rankin  0 ? ? ?ASSESSMENT: 88 year old Caucasian lady with transient episode of expressive  aphasia in December 2022 likely left hemispheric TIA.  Vascular risk factors of hypertension, congestive heart failure, coronary artery disease and mild hyperlipidemia  .  MRI also shows incidental left

## 2021-03-29 NOTE — Patient Instructions (Signed)
I had a long d/w patient and her daughter about her recent episode of transient expressive aphasia likely representing left hemispheric TIA on a small infarct not visualized on MRI as it was done 5 days later, risk for recurrent stroke/TIAs, personally independently reviewed imaging studies and stroke evaluation results and answered questions.recommend she reduce the dose of aspirin to 81 mg daily due to increased bruising  for secondary stroke prevention and maintain strict control of hypertension with blood pressure goal below 130/90, diabetes with hemoglobin A1c goal below 6.5% and lipids with LDL cholesterol goal below 70 mg/dL. I also advised the patient to eat a healthy diet with plenty of whole grains, cereals, fruits and vegetables, exercise regularly and maintain ideal body weight .patient has an upcoming appointment to see her is her cardiologist in [place] this week and I recommend pacemaker interrogation to look for paroxysmal A-fib.  Follow-up in the future with me in 3 months or call earlier if necessary. ? ?Stroke Prevention ?Some medical conditions and behaviors can lead to a higher chance of having a stroke. You can help prevent a stroke by eating healthy, exercising, not smoking, and managing any medical conditions you have. ?Stroke is a leading cause of functional impairment. Primary prevention is particularly important because a majority of strokes are first-time events. Stroke changes the lives of not only those who experience a stroke but also their family and other caregivers. ?How can this condition affect me? ?A stroke is a medical emergency and should be treated right away. A stroke can lead to brain damage and can sometimes be life-threatening. If a person gets medical treatment right away, there is a better chance of surviving and recovering from a stroke. ?What can increase my risk? ?The following medical conditions may increase your risk of a stroke: ?Cardiovascular disease. ?High  blood pressure (hypertension). ?Diabetes. ?High cholesterol. ?Sickle cell disease. ?Blood clotting disorders (hypercoagulable state). ?Obesity. ?Sleep disorders (obstructive sleep apnea). ?Other risk factors include: ?Being older than age 36. ?Having a history of blood clots, stroke, or mini-stroke (transient ischemic attack, TIA). ?Genetic factors, such as race, ethnicity, or a family history of stroke. ?Smoking cigarettes or using other tobacco products. ?Taking birth control pills, especially if you also use tobacco. ?Heavy use of alcohol or drugs, especially cocaine and methamphetamine. ?Physical inactivity. ?What actions can I take to prevent this? ?Manage your health conditions ?High cholesterol levels. ?Eating a healthy diet is important for preventing high cholesterol. If cholesterol cannot be managed through diet alone, you may need to take medicines. ?Take any prescribed medicines to control your cholesterol as told by your health care provider. ?Hypertension. ?To reduce your risk of stroke, try to keep your blood pressure below 130/80. ?Eating a healthy diet and exercising regularly are important for controlling blood pressure. If these steps are not enough to manage your blood pressure, you may need to take medicines. ?Take any prescribed medicines to control hypertension as told by your health care provider. ?Ask your health care provider if you should monitor your blood pressure at home. ?Have your blood pressure checked every year, even if your blood pressure is normal. Blood pressure increases with age and some medical conditions. ?Diabetes. ?Eating a healthy diet and exercising regularly are important parts of managing your blood sugar (glucose). If your blood sugar cannot be managed through diet and exercise, you may need to take medicines. ?Take any prescribed medicines to control your diabetes as told by your health care provider. ?Get evaluated for obstructive sleep  apnea. Talk to your health  care provider about getting a sleep evaluation if you snore a lot or have excessive sleepiness. ?Make sure that any other medical conditions you have, such as atrial fibrillation or atherosclerosis, are managed. ?Nutrition ?Follow instructions from your health care provider about what to eat or drink to help manage your health condition. These instructions may include: ?Reducing your daily calorie intake. ?Limiting how much salt (sodium) you use to 1,500 milligrams (mg) each day. ?Using only healthy fats for cooking, such as olive oil, canola oil, or sunflower oil. ?Eating healthy foods. You can do this by: ?Choosing foods that are high in fiber, such as whole grains, and fresh fruits and vegetables. ?Eating at least 5 servings of fruits and vegetables a day. Try to fill one-half of your plate with fruits and vegetables at each meal. ?Choosing lean protein foods, such as lean cuts of meat, poultry without skin, fish, tofu, beans, and nuts. ?Eating low-fat dairy products. ?Avoiding foods that are high in sodium. This can help lower blood pressure. ?Avoiding foods that have saturated fat, trans fat, and cholesterol. This can help prevent high cholesterol. ?Avoiding processed and prepared foods. ?Counting your daily carbohydrate intake. ? ?Lifestyle ?If you drink alcohol: ?Limit how much you have to: ?0-1 drink a day for women who are not pregnant. ?0-2 drinks a day for men. ?Know how much alcohol is in your drink. In the [place] one drink equals one 12 oz bottle of beer (381mL), one 5 oz glass of wine (157mL), or one 1? oz glass of hard liquor (58mL). ?Do not use any products that contain nicotine or tobacco. These products include cigarettes, chewing tobacco, and vaping devices, such as e-cigarettes. If you need help quitting, ask your health care provider. ?Avoid secondhand smoke. ?Do not use drugs. ?Activity ? ?Try to stay at a healthy weight. ?Get at least 30 minutes of exercise on most days, such as: ?Fast  walking. ?Biking. ?Swimming. ?Medicines ?Take over-the-counter and prescription medicines only as told by your health care provider. Aspirin or blood thinners (antiplatelets or anticoagulants) may be recommended to reduce your risk of forming blood clots that can lead to stroke. ?Avoid taking birth control pills. Talk to your health care provider about the risks of taking birth control pills if: ?You are over 29 years old. ?You smoke. ?You get very bad headaches. ?You have had a blood clot. ?Where to find more information ?American Stroke Association: www.strokeassociation.org ?Get help right away if: ?You or a loved one has any symptoms of a stroke. "BE FAST" is an easy way to remember the main warning signs of a stroke: ?B - Balance. Signs are dizziness, sudden trouble walking, or loss of balance. ?E - Eyes. Signs are trouble seeing or a sudden change in vision. ?F - Face. Signs are sudden weakness or numbness of the face, or the face or eyelid drooping on one side. ?A - Arms. Signs are weakness or numbness in an arm. This happens suddenly and usually on one side of the body. ?S - Speech. Signs are sudden trouble speaking, slurred speech, or trouble understanding what people say. ?T - Time. Time to call emergency services. Write down what time symptoms started. ?You or a loved one has other signs of a stroke, such as: ?A sudden, severe headache with no known cause. ?Nausea or vomiting. ?Seizure. ?These symptoms may represent a serious problem that is an emergency. Do not wait to see if the symptoms will go away. Get medical  help right away. Call your local emergency services (911 in the [place] Do not drive yourself to the hospital. ?Summary ?You can help to prevent a stroke by eating healthy, exercising, not smoking, limiting alcohol intake, and managing any medical conditions you may have. ?Do not use any products that contain nicotine or tobacco. These include cigarettes, chewing tobacco, and vaping devices,  such as e-cigarettes. If you need help quitting, ask your health care provider. ?Remember "BE FAST" for warning signs of a stroke. Get help right away if you or a loved one has any of these signs. ?This informatio

## 2021-04-03 ENCOUNTER — Institutional Professional Consult (permissible substitution): Payer: Medicare Other | Admitting: Pulmonary Disease

## 2021-05-01 ENCOUNTER — Ambulatory Visit (HOSPITAL_COMMUNITY)
Admission: RE | Admit: 2021-05-01 | Discharge: 2021-05-01 | Disposition: A | Discharge status: Home / Self Care | Payer: Medicare Other | Source: Ambulatory Visit | Attending: Orthopedic Surgery | Admitting: Orthopedic Surgery

## 2021-05-01 DIAGNOSIS — M25561 Pain in right knee: Secondary | ICD-10-CM | POA: Diagnosis present

## 2021-05-01 IMAGING — MR MR KNEE*R* W/O CM
5 series · 39 of 40 positions shown · non-contrast
Comparison: None.

CLINICAL DATA: Chronic right knee pain with swelling.

EXAM:
MRI OF THE RIGHT KNEE WITHOUT CONTRAST
TECHNIQUE: Multiplanar, multisequence MR imaging of the knee was performed. No
intravenous contrast was administered.

[Series 12: T2 fat-sat · axial · right · 4.0mm · 0.40mm/px · z∈[-81,+42]mm · 8 of 26 slices shown (1 of 2)]
[im 1/26]
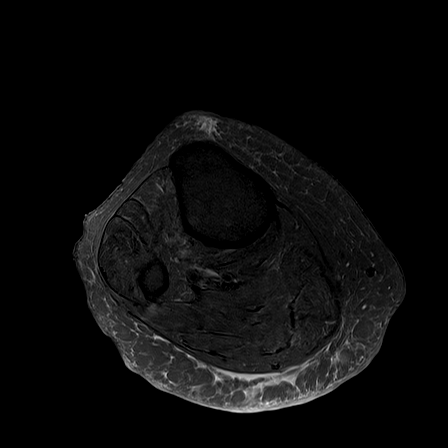
[im 4/26]
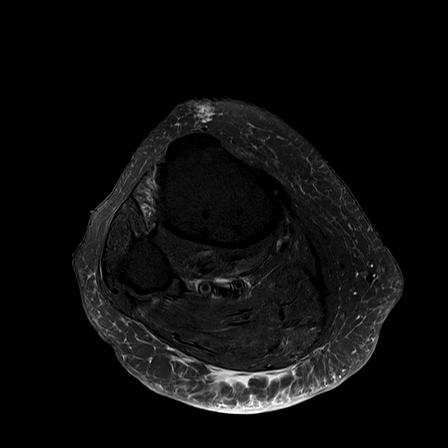
[im 8/26]
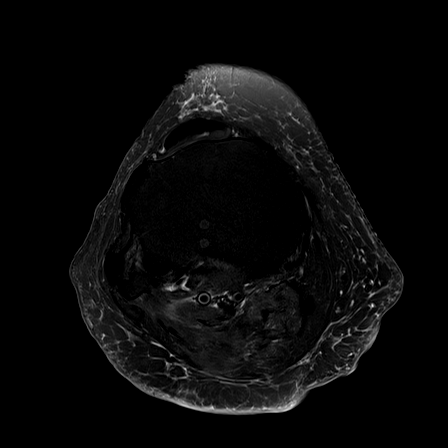
[im 11/26]
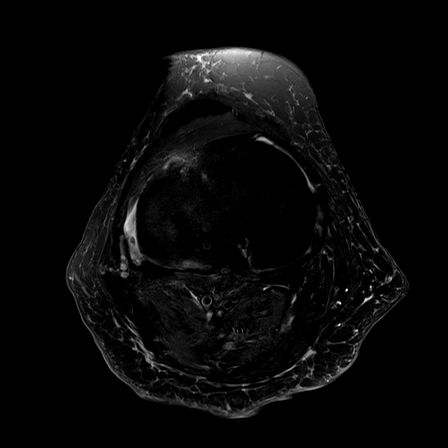
[im 15/26]
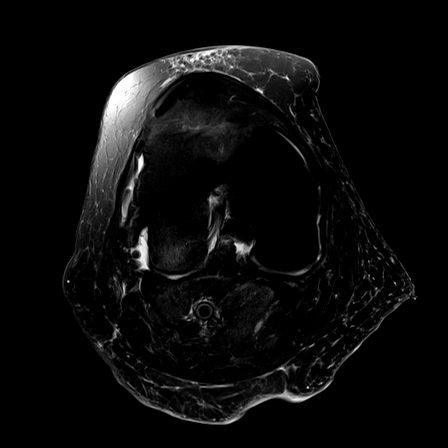
[im 18/26]
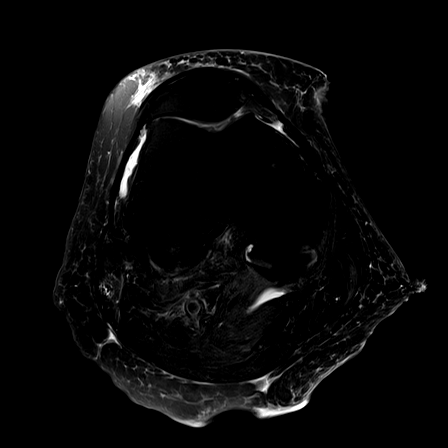
[im 22/26]
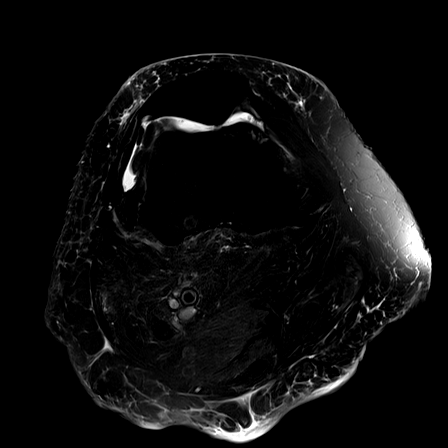
[im 26/26]
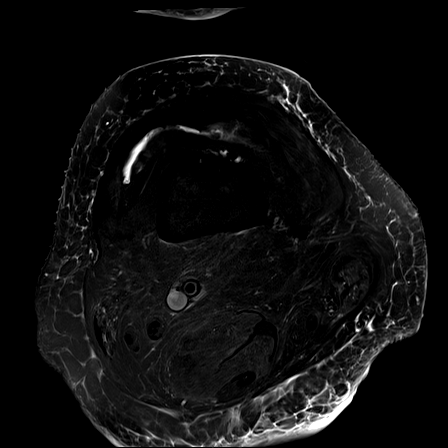

[Series 13: T1 · coronal · right · 4.0mm · 0.53mm/px · 7 of 30 slices shown]
[im 1/30]
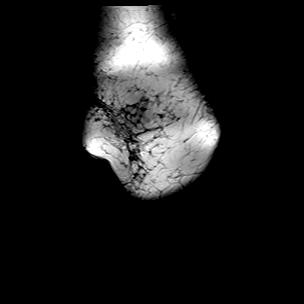
[im 5/30]
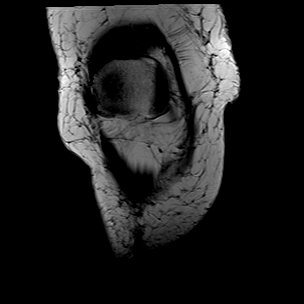
[im 9/30]
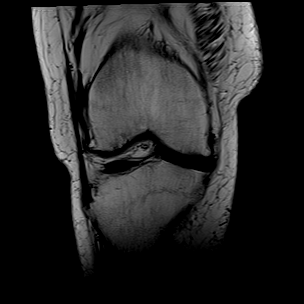
[im 13/30]
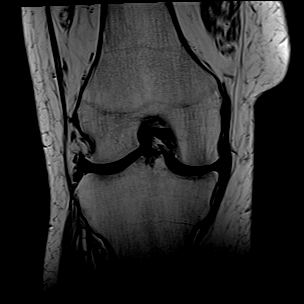
[im 17/30]
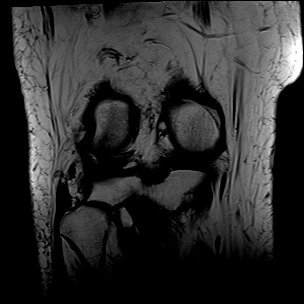
[im 21/30]
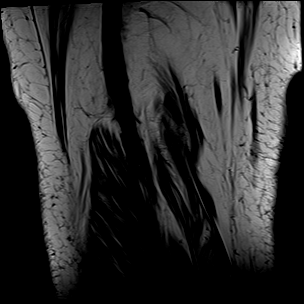
[im 25/30]
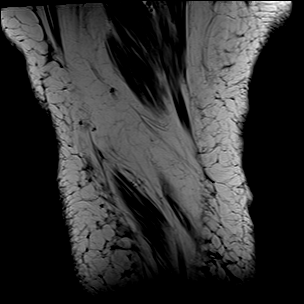

[Series 14: T2 fat-sat · coronal · right · 4.0mm · 0.59mm/px · 8 of 30 slices shown (2 of 2)]
[im 1/30]
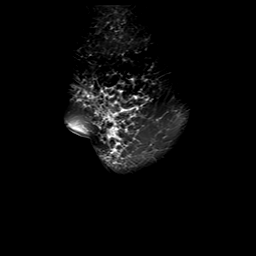
[im 5/30]
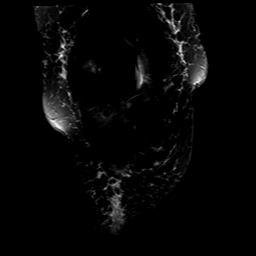
[im 9/30]
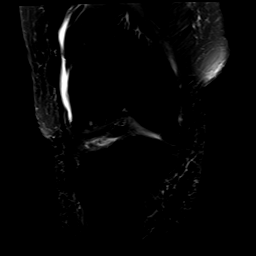
[im 13/30]
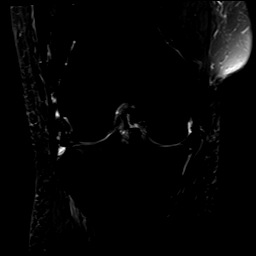
[im 17/30]
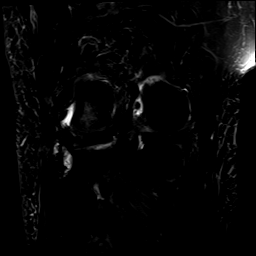
[im 21/30]
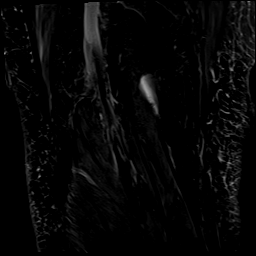
[im 25/30]
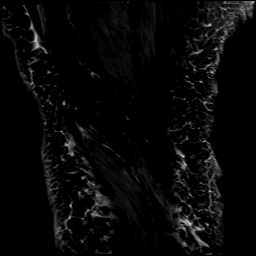
[im 30/30]
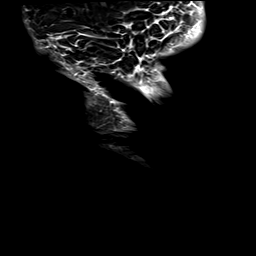

[Series 15: PD fat-sat · coronal · right · 4.0mm · 0.56mm/px · 8 of 30 slices shown (1 of 2)]
[im 1/30]
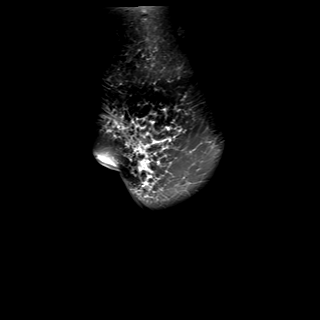
[im 5/30]
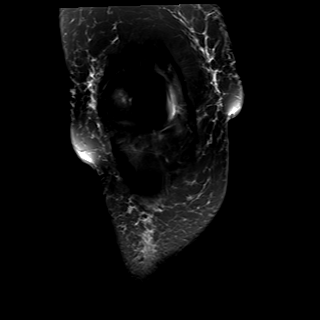
[im 9/30]
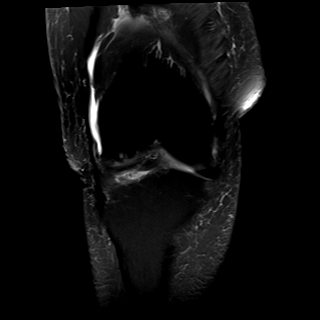
[im 13/30]
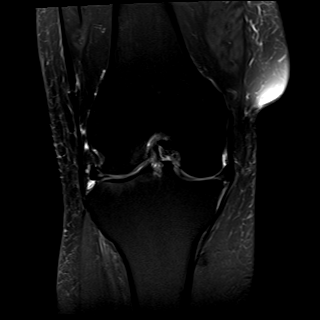
[im 17/30]
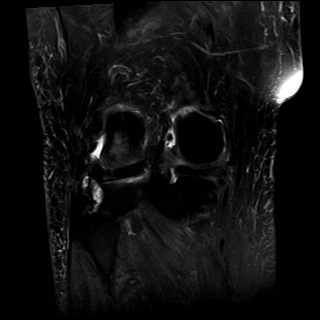
[im 21/30]
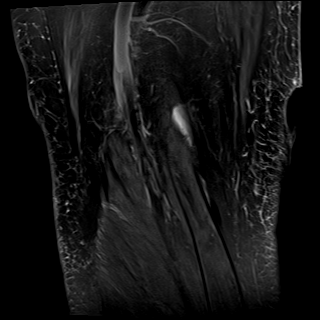
[im 25/30]
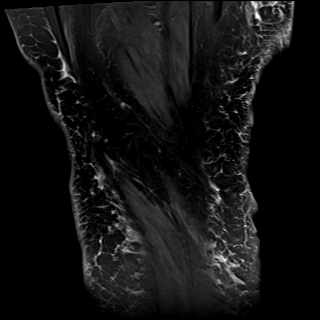
[im 30/30]
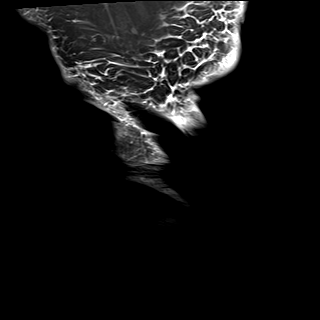

[Series 16: PD fat-sat · sagittal · right · 3.0mm · 0.62mm/px · 8 of 30 slices shown (2 of 2)]
[im 1/30]
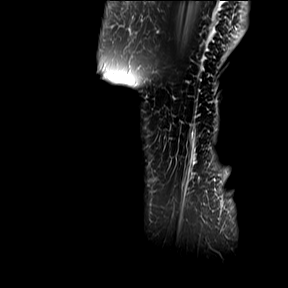
[im 5/30]
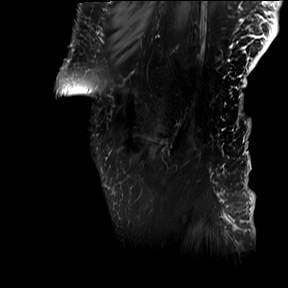
[im 9/30]
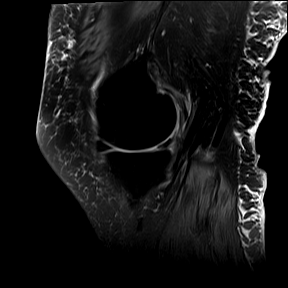
[im 13/30]
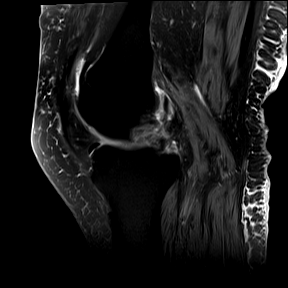
[im 17/30]
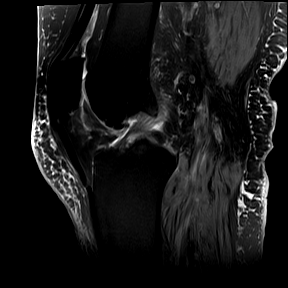
[im 21/30]
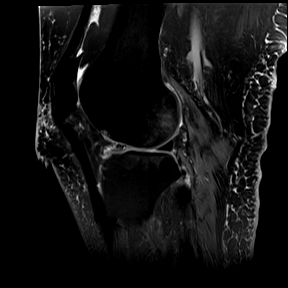
[im 25/30]
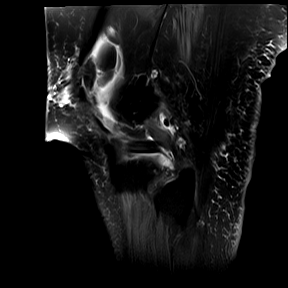
[im 30/30]
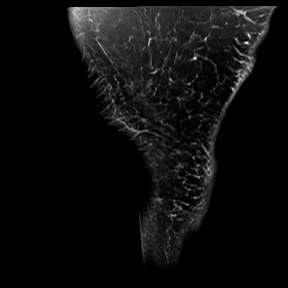

[39 of 40 positions shown; findings below may reference images not displayed]

FINDINGS: MENISCI

Medial meniscus: There is a complex tear of the posterior horn and
posterior segment of the body of the medial meniscus. Throughout the
posterior horn this includes a tear extending through the superior
and inferior articular surfaces of the central third of the meniscal
triangle (sagittal series 16 images 19 through 23), with varying
degrees of meniscal defects. There is an oblique tear extending
through the posterior wall and the inferior articular surface of the
middle third of the meniscal triangle near the root of the posterior
horn (sagittal series 16, image 19), that within the more medial
aspect of the posterior horn becomes a triangular fluid bright
meniscal defect throughout the inferior aspect of the peripheral
third of the meniscal triangle (sagittal series 16 images 20 through
23). There is moderate to high-grade attenuation of the posterior
most aspect of the body of the medial meniscus (coronal series 15
image 15 and 16). There is moderate extrusion of the body of the
medial meniscus with degenerative intermediate proton density signal
throughout the inferior aspect of the free edge of the mid AP
dimension of the meniscal triangle (coronal image 18).

Lateral meniscus: There is intermediate signal intrasubstance
degeneration throughout the posterior horn of the lateral meniscus.
There is horizontal linear increased proton density signal within
the mid AP dimension of the body of the lateral meniscus that
extends through the superior articular surface, a non fluid-bright
and likely degenerative superior articular surface tear (coronal
series 15, image 18). There is mild-to-moderate extrusion of the
body of the lateral meniscus. There is mild diffuse attenuation of
predominantly the superior aspect of the anterior horn of the
lateral meniscus with an oblique tear extending through the superior
articular surface of the junction of the middle and central thirds
of the meniscal triangle (sagittal series 16 images 8 and 9).

LIGAMENTS

Cruciates: The ACL and PCL are intact.

Collaterals: The medial collateral ligament is intact. The fibular
collateral ligament, biceps femoris tendon, iliotibial band, and
popliteus tendon are intact.

CARTILAGE

Patellofemoral: Diffuse high-grade partial to full-thickness
cartilage loss throughout the inferior greater than superior aspect
of the lateral patellar facet and superior greater than inferior
aspect of the lateral trochlea. High-grade thinning of the superior
aspect of the trochlear notch cartilage with mild thinning of the
medial trochlear cartilage.

Medial: Moderate thinning of the medial aspect of the weight-bearing
medial femoral condyle and medial tibial plateau cartilage.

Lateral: Moderate to high-grade thinning of the lateral aspect of
the weight-bearing lateral femoral condyle and lateral tibial
plateau cartilage with moderate subchondral marrow edema throughout
the lateral tibial plateau and posterior aspect of the
weight-bearing lateral femoral condyle. Partial-thickness cartilage
loss of the posterior nonweightbearing lateral femoral condyle.

Joint: Smalljoint effusion. Mild edema within the anterior superior
lateral aspect of Hoffa's fat pad.

Popliteal Fossa:  Tiny Baker's cyst.

Extensor Mechanism: The distal quadriceps tendon insertion is
intact. Mild proximal patellar tendinosis.

Bones: Moderate peripheral medial lateral and patellofemoral
compartment osteophytosis.

Other: None.
IMPRESSION: :
IMPRESSION: 1. Moderate tricompartmental osteoarthritis.
2. Complex tear of the posterior horn and posterior segment of the
body of the medial meniscus.
3. Horizontal superior articular surface tear of the mid AP
dimension of the body of the lateral meniscus with attenuation and
superior articular surface tearing of the anterior horn of the
lateral meniscus.
4. Small joint effusion and tiny Baker's cyst.

## 2021-05-01 NOTE — Progress Notes (Signed)
Per order, Changed device settings for MRI to  ?DOO at 80 bpm  ?Will program device back to pre-MRI settings after completion of exam, and send transmission ?

## 2021-05-11 ENCOUNTER — Encounter: Payer: Self-pay | Admitting: Pulmonary Disease

## 2021-05-11 ENCOUNTER — Ambulatory Visit (INDEPENDENT_AMBULATORY_CARE_PROVIDER_SITE_OTHER): Payer: Medicare Other | Admitting: Pulmonary Disease

## 2021-05-11 VITALS — BP 120/72 | HR 63 | Ht 66.0 in | Wt 192.4 lb

## 2021-05-11 DIAGNOSIS — G4733 Obstructive sleep apnea (adult) (pediatric): Secondary | ICD-10-CM | POA: Diagnosis not present

## 2021-05-11 DIAGNOSIS — Z9989 Dependence on other enabling machines and devices: Secondary | ICD-10-CM

## 2021-05-11 DIAGNOSIS — J449 Chronic obstructive pulmonary disease, unspecified: Secondary | ICD-10-CM

## 2021-05-11 MED ORDER — BREZTRI AEROSPHERE 160-9-4.8 MCG/ACT IN AERO: 2.0000 | INHALATION_SPRAY | Freq: Two times a day (BID) | RESPIRATORY_TRACT | 0 refills | Status: DC

## 2021-05-11 NOTE — Progress Notes (Signed)
? ?      ?Felicia Benson    [id number]    [birth year] ? ?Primary Care Physician:Raju, Dorris Singh, MD ? ?Referring Physician: No referring provider defined for this encounter. ? ?Chief complaint:   ?Patient being seen for COPD ? ?HPI: ? ?Moved to the area and establishing follow-up ? ?Was seen a pulmonologist in [place], was being seen on a yearly basis ?Uses Dulera as needed, only uses it when she feels she needs it ? ?Diagnosed with mild obstructive lung disease ? ?Quit smoking in 1985 ? ?Has a history of congestive heart failure, on Lasix.  History of coronary artery disease s/p stenting in the past ? ?She does have obstructive sleep apnea, has not been using CPAP regularly because she wakes up frequently at night to use the bathroom, she relates this to using Lasix ?She does use Lasix 20 mg in the morning ? ?She is on oxygen 2 L, she does use 2 L of oxygen with the CPAP at night as well ? ?She does have desaturations off the oxygen with exertion ? ?Still follows up with cardiology in [place] ? ?Outpatient Encounter Medications as of 05/11/2021  ?Medication Sig  ? acetaminophen (TYLENOL) 325 MG tablet Take 650 mg by mouth 2 (two) times daily as needed for mild pain or headache.  ? amLODipine (NORVASC) 10 MG tablet Take 1 tablet (10 mg total) by mouth every evening. Hold for next few days  ? aspirin EC 81 MG tablet Take 1 tablet (81 mg total) by mouth daily. Swallow whole.  ? Cholecalciferol (VITAMIN D3) 5000 units TABS Take 5,000 Units by mouth daily.   ? cloNIDine (CATAPRES - DOSED IN MG/24 HR) 0.2 mg/24hr patch Place 0.2 mg onto the skin once a week.  ? DULERA 200-5 MCG/ACT AERO Inhale 1 puff into the lungs 2 (two) times daily.  ? esomeprazole (NEXIUM) 40 MG capsule Take 40 mg by mouth daily.  ? furosemide (LASIX) 40 MG tablet Take 1 tablet (40 mg total) by mouth daily. May take additional 1 tablet (40 mg) for more swelling, shortness of breath or over 2 lbs weight gain  ? hydroxypropyl methylcellulose  / hypromellose (ISOPTO TEARS / GONIOVISC) 2.5 % ophthalmic solution Place 1 drop into both eyes 2 (two) times daily as needed for dry eyes.  ? losartan (COZAAR) 25 MG tablet Take 1 tablet (25 mg total) by mouth daily.  ? mesalamine (PENTASA) 500 MG CR capsule Take 1,500 mg by mouth 2 (two) times daily.  ? metoprolol succinate (TOPROL-XL) 25 MG 24 hr tablet Take 75 mg by mouth 2 (two) times daily.  ? mometasone-formoterol (DULERA) 100-5 MCG/ACT AERO Inhale 2 puffs into the lungs 2 (two) times daily at 8 am and 10 pm.  ? montelukast (SINGULAIR) 10 MG tablet Take 10 mg by mouth daily.  ? multivitamin-lutein (OCUVITE-LUTEIN) CAPS capsule Take 1 capsule by mouth daily.  ? nitroGLYCERIN (NITROSTAT) 0.3 MG SL tablet Place 0.3 mg under the tongue every 5 (five) hours as needed for chest pain.  ? pantoprazole (PROTONIX) 40 MG tablet Take 40 mg by mouth daily.  ? traMADol (ULTRAM) 50 MG tablet Take 50-100 mg by mouth 2 (two) times daily as needed for moderate pain.  ? triamcinolone cream (KENALOG) 0.1 % Apply 1 application topically 2 (two) times daily as needed (yeast infection). Apply to skin under breast tissue  ? atorvastatin (LIPITOR) 20 MG tablet Take 1 tablet (20 mg total) by mouth daily.  ? [DISCONTINUED] SYMBICORT 160-4.5 MCG/ACT  inhaler Inhale 2 puffs into the lungs in the morning and at bedtime.  ? ?No facility-administered encounter medications on file as of 05/11/2021.  ? ? ?Allergies as of 05/11/2021 - Review Complete 05/11/2021  ?Allergen Reaction Noted  ? Nsaids Nausea And Vomiting 12/30/2020  ? ? ?Past Medical History:  ?Diagnosis Date  ? AAA (abdominal aortic aneurysm) (HCC)   ? CHF (congestive heart failure) (HCC)   ? Crohn's disease (HCC)   ? Diverticulitis   ? Diverticulosis   ? GERD (gastroesophageal reflux disease)   ? GI bleed   ? Hypertension   ? OSA (obstructive sleep apnea)   ? Pacemaker   ? Thyroid nodule   ? ? ?Past Surgical History:  ?Procedure Laterality Date  ? CHOLECYSTECTOMY    ? COLONOSCOPY  WITH PROPOFOL N/A 06/01/2015  ? Procedure: COLONOSCOPY WITH PROPOFOL;  Surgeon: Charlott Rakes, MD;  Location: [facility] ENDOSCOPY;  Service: Endoscopy;  Laterality: N/A;  ? CORONARY ANGIOPLASTY WITH STENT PLACEMENT    ? ESOPHAGOGASTRODUODENOSCOPY (EGD) WITH PROPOFOL N/A 06/01/2015  ? Procedure: ESOPHAGOGASTRODUODENOSCOPY (EGD) WITH PROPOFOL;  Surgeon: Charlott Rakes, MD;  Location: [facility] ENDOSCOPY;  Service: Endoscopy;  Laterality: N/A;  ? GIVENS CAPSULE STUDY N/A 06/03/2015  ? Procedure: GIVENS CAPSULE STUDY;  Surgeon: Charlott Rakes, MD;  Location: [facility] ENDOSCOPY;  Service: Endoscopy;  Laterality: N/A;  ? PACEMAKER PLACEMENT  2015  ? ? ?Family History  ?Problem Relation Age of Onset  ? CVA Mother   ? Lung cancer Father   ? Colon cancer Neg Hx   ? ? ?Social History  ? ?Socioeconomic History  ? Marital status: Widowed  ?  Spouse name: Not on file  ? Number of children: Not on file  ? Years of education: Not on file  ? Highest education level: Not on file  ?Occupational History  ? Not on file  ?Tobacco Use  ? Smoking status: Former  ? Smokeless tobacco: Never  ?Vaping Use  ? Vaping Use: Never used  ?Substance and Sexual Activity  ? Alcohol use: No  ?  Comment: Quit in 1985  ? Drug use: No  ? Sexual activity: Not on file  ?Other Topics Concern  ? Not on file  ?Social History Narrative  ? Not on file  ? ?Social Determinants of Health  ? ?Financial Resource Strain: Not on file  ?Food Insecurity: Not on file  ?Transportation Needs: Not on file  ?Physical Activity: Not on file  ?Stress: Not on file  ?Social Connections: Not on file  ?Intimate Partner Violence: Not on file  ? ? ?Review of Systems  ?Respiratory:  Positive for apnea and shortness of breath.   ?Psychiatric/Behavioral:  Positive for sleep disturbance.   ? ?Vitals:  ? 05/11/21 1504  ?BP: 120/72  ?Pulse: 63  ?SpO2: 95%  ? ? ? ?Physical Exam ?Constitutional:   ?   Appearance: She is obese.  ?HENT:  ?   Head: Normocephalic.  ?   Nose: Nose normal.  ?   Mouth/Throat:  ?    Mouth: Mucous membranes are moist.  ?Cardiovascular:  ?   Rate and Rhythm: Normal rate and regular rhythm.  ?   Heart sounds: No murmur heard. ?  No friction rub.  ?Pulmonary:  ?   Effort: No respiratory distress.  ?   Breath sounds: No stridor. No wheezing or rhonchi.  ?Musculoskeletal:  ?   Cervical back: No rigidity or tenderness.  ?Neurological:  ?   Mental Status: She is alert.  ?Psychiatric:     ?  Mood and Affect: Mood normal.  ? ? ? ?Data Reviewed: ?Most recent echocardiogram 12/31/2020 shows normal ejection fraction, grade 1 diastolic dysfunction, no pulmonary hypertension ? ?Did review care records on care everywhere ? ?Assessment:  ?Chronic obstructive pulmonary disease ?-She is only comfortable continuing to use inhalers as needed ?-She has chronically depended on inhalers samples ?-I did let her know that we cannot provide samples of inhalers all the time, we do this for medication starts ? ?History of obstructive sleep apnea ?-Encouraged to resume using CPAP on a nightly basis ?-She can try to disconnect the hose from the headgear when she needs to use the restroom ? ?Continue oxygen supplementation for chronic respiratory failure ? ?Plan/Recommendations: ?Samples of Breztri provided ? ?I will see patient on a yearly basis ? ?Will try and review old records ? ? ?Encouraged to call with any significant concerns ? ? ? ? ? ?Virl Diamond MD ?[facility] ?05/11/2021, 4:09 PM ? ?CC: No ref. provider found ? ? ?

## 2021-05-11 NOTE — Patient Instructions (Signed)
I will see you on a yearly basis ? ?Try and get back to using CPAP on a nightly basis ? ?Continue oxygen supplementation ? ?Try and limit water intake in the evenings ? ?Call us with significant concerns ? ?We only have samples of Breztri,  2 puffs twice a day ? ? ?

## 2021-06-12 ENCOUNTER — Ambulatory Visit: Payer: Medicare Other | Admitting: Podiatry

## 2021-06-14 ENCOUNTER — Other Ambulatory Visit: Payer: Self-pay | Admitting: Internal Medicine

## 2021-06-14 DIAGNOSIS — I7121 Aneurysm of the ascending aorta, without rupture: Secondary | ICD-10-CM

## 2021-06-27 ENCOUNTER — Ambulatory Visit (INDEPENDENT_AMBULATORY_CARE_PROVIDER_SITE_OTHER): Payer: Medicare Other | Admitting: Podiatry

## 2021-06-27 DIAGNOSIS — M79674 Pain in right toe(s): Secondary | ICD-10-CM | POA: Diagnosis not present

## 2021-06-27 DIAGNOSIS — B351 Tinea unguium: Secondary | ICD-10-CM | POA: Diagnosis not present

## 2021-06-27 DIAGNOSIS — R21 Rash and other nonspecific skin eruption: Secondary | ICD-10-CM | POA: Diagnosis not present

## 2021-06-27 DIAGNOSIS — M79675 Pain in left toe(s): Secondary | ICD-10-CM

## 2021-06-27 MED ORDER — CLOTRIMAZOLE-BETAMETHASONE 1-0.05 % EX CREA: 1.0000 | TOPICAL_CREAM | Freq: Two times a day (BID) | CUTANEOUS | 0 refills | Status: DC

## 2021-07-04 ENCOUNTER — Ambulatory Visit: Payer: Medicare Other | Admitting: Neurology

## 2021-07-05 ENCOUNTER — Ambulatory Visit
Admission: RE | Admit: 2021-07-05 | Discharge: 2021-07-05 | Disposition: A | Discharge status: Home / Self Care | Payer: Medicare Other | Source: Ambulatory Visit | Attending: Internal Medicine | Admitting: Internal Medicine

## 2021-07-05 DIAGNOSIS — Z13820 Encounter for screening for osteoporosis: Secondary | ICD-10-CM

## 2021-07-05 DIAGNOSIS — Z1231 Encounter for screening mammogram for malignant neoplasm of breast: Secondary | ICD-10-CM

## 2021-07-10 NOTE — Progress Notes (Unsigned)
[address]       [place] [address]             [phone]        PCP is [name], MD Referring Provider is [name], MD  Chief Complaint: Ascending thoracic aortic aneurysm   HPI: This is an 88 year old female with a past medical history of AAA, CHF, GERD, hypertension, OSA, GI bleed, CKD (stage III), meningioma, COPD,and Crohn's disease who was found to have an  ascending thoracic aortic aneurysm.  Past Medical History:  Diagnosis Date   AAA (abdominal aortic aneurysm) (HCC)    CHF (congestive heart failure) (HCC)    Crohn's disease (HCC)    Diverticulitis    Diverticulosis    GERD (gastroesophageal reflux disease)    GI bleed    Hypertension    OSA (obstructive sleep apnea)    Pacemaker    Thyroid nodule     Past Surgical History:  Procedure Laterality Date   CHOLECYSTECTOMY     COLONOSCOPY WITH PROPOFOL N/A 06/01/2015   Procedure: COLONOSCOPY WITH PROPOFOL;  Surgeon: Charlott Rakes, MD;  Location: [facility] ENDOSCOPY;  Service: Endoscopy;  Laterality: N/A;   CORONARY ANGIOPLASTY WITH STENT PLACEMENT     ESOPHAGOGASTRODUODENOSCOPY (EGD) WITH PROPOFOL N/A 06/01/2015   Procedure: ESOPHAGOGASTRODUODENOSCOPY (EGD) WITH PROPOFOL;  Surgeon: Charlott Rakes, MD;  Location: [facility] ENDOSCOPY;  Service: Endoscopy;  Laterality: N/A;   GIVENS CAPSULE STUDY N/A 06/03/2015   Procedure: GIVENS CAPSULE STUDY;  Surgeon: Charlott Rakes, MD;  Location: [facility] ENDOSCOPY;  Service: Endoscopy;  Laterality: N/A;   PACEMAKER PLACEMENT  2015    Family History  Problem Relation Age of Onset   CVA Mother    Lung cancer Father    Colon cancer Neg Hx     Social History Social History   Tobacco Use   Smoking status: Former   Smokeless tobacco: Never  Building services engineer Use: Never used  Substance Use Topics   Alcohol use: No    Comment: Quit in 1985   Drug use: No    Current Outpatient Medications  Medication Sig Dispense Refill   acetaminophen  (TYLENOL) 325 MG tablet Take 650 mg by mouth 2 (two) times daily as needed for mild pain or headache.     amLODipine (NORVASC) 10 MG tablet Take 1 tablet (10 mg total) by mouth every evening. Hold for next few days     aspirin EC 81 MG tablet Take 1 tablet (81 mg total) by mouth daily. Swallow whole. 30 tablet 11   atorvastatin (LIPITOR) 20 MG tablet Take 1 tablet (20 mg total) by mouth daily. 30 tablet 0   Budeson-Glycopyrrol-Formoterol (BREZTRI AEROSPHERE) 160-9-4.8 MCG/ACT AERO Inhale 2 puffs into the lungs in the morning and at bedtime. 11.8 g 0   Cholecalciferol (VITAMIN D3) 5000 units TABS Take 5,000 Units by mouth daily.      cloNIDine (CATAPRES - DOSED IN MG/24 HR) 0.2 mg/24hr patch Place 0.2 mg onto the skin once a week.     clotrimazole-betamethasone (LOTRISONE) cream Apply 1 Application topically 2 (two) times daily. 30 g 0   DULERA 200-5 MCG/ACT AERO Inhale 1 puff into the lungs 2 (two) times daily.     esomeprazole (NEXIUM) 40 MG capsule Take 40 mg by mouth daily.     furosemide (LASIX) 40 MG tablet Take 1 tablet (40 mg total) by mouth daily. May take additional 1 tablet (40 mg) for  more swelling, shortness of breath or over 2 lbs weight gain 180 tablet 1   hydroxypropyl methylcellulose / hypromellose (ISOPTO TEARS / GONIOVISC) 2.5 % ophthalmic solution Place 1 drop into both eyes 2 (two) times daily as needed for dry eyes.     losartan (COZAAR) 25 MG tablet Take 1 tablet (25 mg total) by mouth daily. 90 tablet 1   mesalamine (PENTASA) 500 MG CR capsule Take 1,500 mg by mouth 2 (two) times daily.     metoprolol succinate (TOPROL-XL) 25 MG 24 hr tablet Take 75 mg by mouth 2 (two) times daily.     mometasone-formoterol (DULERA) 100-5 MCG/ACT AERO Inhale 2 puffs into the lungs 2 (two) times daily at 8 am and 10 pm.     montelukast (SINGULAIR) 10 MG tablet Take 10 mg by mouth daily.     multivitamin-lutein (OCUVITE-LUTEIN) CAPS capsule Take 1 capsule by mouth daily.     nitroGLYCERIN  (NITROSTAT) 0.3 MG SL tablet Place 0.3 mg under the tongue every 5 (five) hours as needed for chest pain.     pantoprazole (PROTONIX) 40 MG tablet Take 40 mg by mouth daily.     traMADol (ULTRAM) 50 MG tablet Take 50-100 mg by mouth 2 (two) times daily as needed for moderate pain.     triamcinolone cream (KENALOG) 0.1 % Apply 1 application topically 2 (two) times daily as needed (yeast infection). Apply to skin under breast tissue     Allergies  Allergen Reactions   Nsaids Nausea And Vomiting    Pt has preference to tylonel  Vital Signs:  Review of Systems  There were no vitals taken for this visit. Physical Exam: CV Pulmonary Abdomen Extremities   Diagnostic Tests:   Impression and Plan: Risk Modification in those with ascending thoracic aortic aneurysm:  Continue good control of blood pressure (prefer SBP 130/80 or less)  2. Avoid fluoroquinolone antibiotics (I.e Ciprofloxacin, Avelox, Levofloxacin, Ofloxacin)  3.  Use of statin (to decrease cardiovascular risk)  4.  Exercise and activity limitations is individualized, but in general, contact sports are to be  avoided and one should avoid heavy lifting (defined as half of ideal body weight) and exercises involving sustained Valsalva maneuver.  5. Counseling for those suspected of having genetically mediated disease. First-degree relatives of those with TAA disease should be screened as well as those who have a connective tissue disease (I.e with Marfan syndrome, Ehlers-Danlos syndrome,  and Loeys-Dietz syndrome) or a  bicuspid aortic valve,have an increased risk for  complications related to TAA  6. If one has tobacco abuse, smoking cessation is highly encouraged.      Ardelle Balls, PA-C [facility] [phone]

## 2021-07-12 ENCOUNTER — Ambulatory Visit
Admission: RE | Admit: 2021-07-12 | Discharge: 2021-07-12 | Disposition: A | Discharge status: Home / Self Care | Payer: Medicare Other | Source: Ambulatory Visit | Attending: Internal Medicine | Admitting: Internal Medicine

## 2021-07-12 DIAGNOSIS — I7121 Aneurysm of the ascending aorta, without rupture: Secondary | ICD-10-CM

## 2021-07-12 MED ORDER — IOPAMIDOL (ISOVUE-370) INJECTION 76%
75.0000 mL | Freq: Once | INTRAVENOUS | Status: AC | PRN
  Administered 2021-07-12: 75 mL via INTRAVENOUS

## 2021-07-13 ENCOUNTER — Institutional Professional Consult (permissible substitution) (INDEPENDENT_AMBULATORY_CARE_PROVIDER_SITE_OTHER): Payer: Medicare Other | Admitting: Physician Assistant

## 2021-07-13 VITALS — BP 92/66 | HR 82 | Resp 20 | Ht 66.0 in | Wt 192.0 lb

## 2021-07-13 DIAGNOSIS — I7121 Aneurysm of the ascending aorta, without rupture: Secondary | ICD-10-CM | POA: Diagnosis not present

## 2021-07-13 NOTE — Patient Instructions (Addendum)
Risk Modification in those with ascending thoracic aortic aneurysm:  Continue good control of blood pressure (prefer SBP 130/80 or less). Continue Amlodipine, Clonidine patch, Toprol XL, and Losartan  2. Avoid fluoroquinolone antibiotics (I.e Ciprofloxacin, Avelox, Levofloxacin, Ofloxacin)  3.  Use of statin (to decrease cardiovascular risk)-continue Atorvastatin 20 mg daily.  4.  Exercise and activity limitations is individualized, but in general, contact sports are to be  avoided and one should avoid heavy lifting (defined as half of ideal body weight) and exercises involving sustained Valsalva maneuver.  5. Counseling for those suspected of having genetically mediated disease. First-degree relatives of those with TAA disease should be screened as well as those who have a connective tissue disease (I.e with Marfan syndrome, Ehlers-Danlos syndrome,  and Loeys-Dietz syndrome) or a  bicuspid aortic valve,have an increased risk for  complications related to TAA. She has no history of connective tissue disease in the family. Echo done 12/2020 showed the aortic valve to be tricuspid.  6. She had a history of remote tobacco abuse but quit

## 2021-07-18 ENCOUNTER — Ambulatory Visit (INDEPENDENT_AMBULATORY_CARE_PROVIDER_SITE_OTHER): Payer: Medicare Other | Admitting: Neurology

## 2021-07-18 ENCOUNTER — Encounter: Payer: Self-pay | Admitting: Neurology

## 2021-07-18 VITALS — BP 150/88 | HR 80 | Ht 66.0 in | Wt 193.0 lb

## 2021-07-18 DIAGNOSIS — G3184 Mild cognitive impairment, so stated: Secondary | ICD-10-CM

## 2021-07-18 DIAGNOSIS — G44209 Tension-type headache, unspecified, not intractable: Secondary | ICD-10-CM

## 2021-07-18 DIAGNOSIS — Z86011 Personal history of benign neoplasm of the brain: Secondary | ICD-10-CM

## 2021-07-18 MED ORDER — PREVAGEN 10 MG PO CAPS: 1.0000 | ORAL_CAPSULE | ORAL | 1 refills | Status: AC

## 2021-07-18 NOTE — Progress Notes (Signed)
[facility] [address] [place]. [place] [address] [phone]       OFFICE FOLLOW UP VISIT NOTE  Felicia Benson Date of Birth:  1930/07/13 Medical Record Number:  [id number]   Referring MD:  Gerlean Ren  Reason for Referral:  Aphasia  HPI: Initial visit 03/29/2021 ;Felicia Benson is a pleasant 88 year old Caucasian lady with past medical history of hypertension, diastolic congestive heart failure, coronary artery disease s/p RCA stent, chronic kidney disease stage III, thoracic aortic aneurysm, Crohn's disease.  She presented on 12/30/2020 with sudden onset of speaking difficulty.  She was talking to her daughter and was given her instructions on how to bake a cake when she started using wrong words and her speech became gibberish and she knew what she wanted to say but the words were not coming out the way she wanted.  She could understand everything.  She denies accompanying symptoms in the form of headache, blurred vision, facial droop or extremity weakness or numbness.  Her symptoms lasted around 45 minutes and cleared by the time she reached the hospital.  CT scan of the head was unremarkable and MRI could not be done since she had a pacemaker and technician was not available.  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  2D echo showed ejection fraction of 60 to 65% without cardiac source of embolism.  CT angiograms of the brain and neck did not show any large vessel stenosis or occlusion.  LDL cholesterol was 74 mg percent and hemoglobin A1c was 5.8.  Patient was previously on aspirin 81 and the dose was increased to 325 mg daily.  Patient states she has noticed increased bruising after increasing the dose of aspirin.  She is complaints of mild short-term memory difficulties for several years but these are not progressive.  She is independent in  activities of daily living and manages own affairs.  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  She has had no falls or injuries.  She denies any prior history of atrial fibrillation, syncope, palpitations.  She does have a pacemaker and has an upcoming appointment to see her cardiologist in [place] later this week and hopefully he can interrogate the pacemaker to see if there is any A-fib.  She denies any prior history of strokes, TIAs except this episode.  No history of seizures, loss of consciousness or head injury. Update 07/18/2021 : She returns for follow-up after last visit 3 and half months ago.  She is accompanied by her daughter.  Patient has had no further episodes of TIA or stroke.  She remains on aspirin she is tolerating well with easy bruising but no bleeding episodes.  Her blood pressure has been quite erratic and every time the blood pressure goes up she develops headaches which she describes as a pressure-like sensation with posterior neck pain and restriction of movements.  When the blood pressure comes down and headaches are gone.  She does take some Tylenol which occasionally helps.  She continues to have mild memory difficulties and has good days and bad days these are not progressive.  She has heard about Prevagen and is asking if she can take it.  She remains on home oxygen and ambulates with a wheeled walker.  She has had no falls or injuries.  She denies any delusions or  hallucinations.  She is still mostly independent in most activities of daily living. ROS:   14 system review of systems is positive for speech difficulty, word substitution, gibberish speech, bruising all other systems negative  PMH:  Past Medical History:  Diagnosis Date   AAA (abdominal aortic aneurysm) (HCC)    CHF (congestive heart failure) (HCC)    Crohn's disease (HCC)    Diverticulitis    Diverticulosis    GERD (gastroesophageal reflux disease)    GI bleed     Hypertension    OSA (obstructive sleep apnea)    Pacemaker    Thyroid nodule     Social History:  Social History   Socioeconomic History   Marital status: Widowed    Spouse name: Not on file   Number of children: Not on file   Years of education: Not on file   Highest education level: Not on file  Occupational History   Not on file  Tobacco Use   Smoking status: Former   Smokeless tobacco: Never  Vaping Use   Vaping Use: Never used  Substance and Sexual Activity   Alcohol use: No    Comment: Quit in 1985   Drug use: No   Sexual activity: Not on file  Other Topics Concern   Not on file  Social History Narrative   Not on file   Social Determinants of Health   Financial Resource Strain: Not on file  Food Insecurity: Not on file  Transportation Needs: Not on file  Physical Activity: Not on file  Stress: Not on file  Social Connections: Not on file  Intimate Partner Violence: Not on file    Medications:   Current Outpatient Medications on File Prior to Visit  Medication Sig Dispense Refill   acetaminophen (TYLENOL) 325 MG tablet Take 650 mg by mouth 2 (two) times daily as needed for mild pain or headache.     amLODipine (NORVASC) 10 MG tablet Take 1 tablet (10 mg total) by mouth every evening. Hold for next few days     aspirin EC 81 MG tablet Take 1 tablet (81 mg total) by mouth daily. Swallow whole. 30 tablet 11   atorvastatin (LIPITOR) 20 MG tablet Take 1 tablet (20 mg total) by mouth daily. (Patient taking differently: Take 40 mg by mouth daily.) 30 tablet 0   Budeson-Glycopyrrol-Formoterol (BREZTRI AEROSPHERE) 160-9-4.8 MCG/ACT AERO Inhale 2 puffs into the lungs in the morning and at bedtime. 11.8 g 0   Cholecalciferol (VITAMIN D3) 5000 units TABS Take 5,000 Units by mouth daily.      cloNIDine (CATAPRES - DOSED IN MG/24 HR) 0.2 mg/24hr patch Place 0.3 mg onto the skin once a week.     clotrimazole-betamethasone (LOTRISONE) cream Apply 1 Application topically 2  (two) times daily. 30 g 0   DULERA 200-5 MCG/ACT AERO Inhale 1 puff into the lungs 2 (two) times daily.     esomeprazole (NEXIUM) 40 MG capsule Take 40 mg by mouth daily.     furosemide (LASIX) 40 MG tablet Take 1 tablet (40 mg total) by mouth daily. May take additional 1 tablet (40 mg) for more swelling, shortness of breath or over 2 lbs weight gain 180 tablet 1   hydroxypropyl methylcellulose / hypromellose (ISOPTO TEARS / GONIOVISC) 2.5 % ophthalmic solution Place 1 drop into both eyes 2 (two) times daily as needed for dry eyes.     losartan (COZAAR) 25 MG tablet Take 1 tablet (25 mg total) by mouth daily. (Patient taking  differently: Take 100 mg by mouth daily.) 90 tablet 1   mesalamine (PENTASA) 500 MG CR capsule Take 1,500 mg by mouth 2 (two) times daily.     metoprolol succinate (TOPROL-XL) 25 MG 24 hr tablet Take 75 mg by mouth 2 (two) times daily.     mometasone-formoterol (DULERA) 100-5 MCG/ACT AERO Inhale 2 puffs into the lungs 2 (two) times daily at 8 am and 10 pm.     montelukast (SINGULAIR) 10 MG tablet Take 10 mg by mouth daily.     multivitamin-lutein (OCUVITE-LUTEIN) CAPS capsule Take 1 capsule by mouth daily.     nitroGLYCERIN (NITROSTAT) 0.3 MG SL tablet Place 0.3 mg under the tongue every 5 (five) hours as needed for chest pain.     pantoprazole (PROTONIX) 40 MG tablet Take 40 mg by mouth daily.     traMADol (ULTRAM) 50 MG tablet Take 50-100 mg by mouth 2 (two) times daily as needed for moderate pain.     triamcinolone cream (KENALOG) 0.1 % Apply 1 application topically 2 (two) times daily as needed (yeast infection). Apply to skin under breast tissue     No current facility-administered medications on file prior to visit.    Allergies:   Allergies  Allergen Reactions   Ibuprofen     Other reaction(s): gi bleed   Ace Inhibitors     Other reaction(s): cough   Nsaids Nausea And Vomiting    Pt has preference to tylonel    Physical Exam General: Mildly obese pleasant  elderly Caucasian lady, seated, in no evident distress.  She is on home oxygen. Head: head normocephalic and atraumatic.   Neck: supple with no carotid or supraclavicular bruits Cardiovascular: regular rate and rhythm, no murmurs Musculoskeletal: no deformity Skin:  no rash/petichiae Vascular:  Normal pulses all extremities  Neurologic Exam Mental Status: Awake and fully alert. Oriented to place and time. Recent and remote memory intact. Attention span, concentration and fund of knowledge appropriate. Mood and affect appropriate.  Recall 3/3.  Able to name only 7 animals which can walk on 4 legs.  Clock drawing 4/4 allowing for slight discrepancies due to poor vision. Cranial Nerves: Fundoscopic exam reveals sharp disc margins. Pupils equal, briskly reactive to light. Extraocular movements full without nystagmus. Visual fields full to confrontation. Hearing intact. Facial sensation intact. Face, tongue, palate moves normally and symmetrically.  Motor: Normal bulk and tone. Normal strength in all tested extremity muscles. Sensory.: intact to touch , pinprick , position and vibratory sensation.  Coordination: Rapid alternating movements normal in all extremities. Finger-to-nose and heel-to-shin performed accurately bilaterally. Gait and Station: Arises from chair without difficulty. Stance is normal.  Uses a wheeled walker.  Slight favoring of her knees due to pain.  Gait demonstrates normal stride length and balance .  Tandem walking not tested Reflexes: 1+ and symmetric. Toes downgoing.      ASSESSMENT: 88 year old Caucasian lady with transient episode of expressive aphasia in December 2022 likely left hemispheric TIA.  Vascular risk factors of hypertension, congestive heart failure, coronary artery disease and mild hyperlipidemia  .  MRI also shows incidental left cavernous and orbital apex meningioma which can be managed conservatively.  She also has mild age-appropriate mild cognitive  impairment as well as tension headaches.     PLAN: I I had a long d/w patient and her daughter about her worsening memory and cognitive difficulties as well as tension headaches and history of TIA.  Continue aspirin 81 mg daily  for secondary stroke  prevention and maintain strict control of hypertension with blood pressure goal below 130/90, diabetes with hemoglobin A1c goal below 6.5% and lipids with LDL cholesterol goal below 70 mg/dL.  I encouraged her to do regular neck stretching exercises for her tension headache.  I encouraged her to increase participation in cognitively challenging activities like solving crossword puzzles, playing bridge and sudoku.  We also discussed memory compensation strategies.  She can also try taking Prevagen 1 tablet daily for her cognitive impairment.  Recommend continue conservative follow-up for her left cavernous meningioma and may do repeat MRI in a year for surveillance.  Follow-up in the future with me in 6 months or call earlier if necessary. Greater than 50% time during this 30-minute  visit was spent on counseling and coordination of care about her episode of aphasia and TIA and discussion about evaluation and treatment and prevention plan and answering questions Antony Contras, MD  Note: This document was prepared with digital dictation and possible smart phrase technology. Any transcriptional errors that result from this process are unintentional.

## 2021-07-18 NOTE — Patient Instructions (Signed)
I had a long d/w patient and her daughter about her worsening memory and cognitive difficulties as well as tension headaches and history of TIA.  Continue aspirin 81 mg daily  for secondary stroke prevention and maintain strict control of hypertension with blood pressure goal below 130/90, diabetes with hemoglobin A1c goal below 6.5% and lipids with LDL cholesterol goal below 70 mg/dL.  I encouraged her to do regular neck stretching exercises for her tension headache.  I encouraged her to increase participation in cognitively challenging activities like solving crossword puzzles, playing bridge and sudoku.  We also discussed memory compensation strategies.  She can also try taking Prevagen 1 tablet daily for her cognitive impairment.  Recommend continue conservative follow-up for her left cavernous meningioma and may do repeat MRI in a year for surveillance.  Follow-up in the future with me in 3 months or call earlier if necessary. Neck Exercises Ask your health care provider which exercises are safe for you. Do exercises exactly as told by your health care provider and adjust them as directed. It is normal to feel mild stretching, pulling, tightness, or discomfort as you do these exercises. Stop right away if you feel sudden pain or your pain gets worse. Do not begin these exercises until told by your health care provider. Neck exercises can be important for many reasons. They can improve strength and maintain flexibility in your neck, which will help your upper back and prevent neck pain. Stretching exercises Rotation neck stretching  Sit in a chair or stand up. Place your feet flat on the floor, shoulder-width apart. Slowly turn your head (rotate) to the right until a slight stretch is felt. Turn it all the way to the right so you can look over your right shoulder. Do not tilt or tip your head. Hold this position for 10-30 seconds. Slowly turn your head (rotate) to the left until a slight stretch is  felt. Turn it all the way to the left so you can look over your left shoulder. Do not tilt or tip your head. Hold this position for 10-30 seconds. Repeat __________ times. Complete this exercise __________ times a day. Neck retraction  Sit in a sturdy chair or stand up. Look straight ahead. Do not bend your neck. Use your fingers to push your chin backward (retraction). Do not bend your neck for this movement. Continue to face straight ahead. If you are doing the exercise properly, you will feel a slight sensation in your throat and a stretch at the back of your neck. Hold the stretch for 1-2 seconds. Repeat __________ times. Complete this exercise __________ times a day. Strengthening exercises Neck press  Lie on your back on a firm bed or on the floor with a pillow under your head. Use your neck muscles to push your head down on the pillow and straighten your spine. Hold the position as well as you can. Keep your head facing up (in a neutral position) and your chin tucked. Slowly count to 5 while holding this position. Repeat __________ times. Complete this exercise __________ times a day. Isometrics These are exercises in which you strengthen the muscles in your neck while keeping your neck still (isometrics). Sit in a supportive chair and place your hand on your forehead. Keep your head and face facing straight ahead. Do not flex or extend your neck while doing isometrics. Push forward with your head and neck while pushing back with your hand. Hold for 10 seconds. Do the sequence again, this  time putting your hand against the back of your head. Use your head and neck to push backward against the hand pressure. Finally, do the same exercise on either side of your head, pushing sideways against the pressure of your hand. Repeat __________ times. Complete this exercise __________ times a day. Prone head lifts  Lie face-down (prone position), resting on your elbows so that your chest and  upper back are raised. Start with your head facing downward, near your chest. Position your chin either on or near your chest. Slowly lift your head upward. Lift until you are looking straight ahead. Then continue lifting your head as far back as you can comfortably stretch. Hold your head up for 5 seconds. Then slowly lower it to your starting position. Repeat __________ times. Complete this exercise __________ times a day. Supine head lifts  Lie on your back (supine position), bending your knees to point to the ceiling and keeping your feet flat on the floor. Lift your head slowly off the floor, raising your chin toward your chest. Hold for 5 seconds. Repeat __________ times. Complete this exercise __________ times a day. Scapular retraction  Stand with your arms at your sides. Look straight ahead. Slowly pull both shoulders (scapulae) backward and downward (retraction) until you feel a stretch between your shoulder blades in your upper back. Hold for 10-30 seconds. Relax and repeat. Repeat __________ times. Complete this exercise __________ times a day. Contact a health care provider if: Your neck pain or discomfort gets worse when you do an exercise. Your neck pain or discomfort does not improve within 2 hours after you exercise. If you have any of these problems, stop exercising right away. Do not do the exercises again unless your health care provider says that you can. Get help right away if: You develop sudden, severe neck pain. If this happens, stop exercising right away. Do not do the exercises again unless your health care provider says that you can. This information is not intended to replace advice given to you by your health care provider. Make sure you discuss any questions you have with your health care provider. Document Revised: 06/21/2020 Document Reviewed: 06/21/2020 Elsevier Patient Education  2023 [employer] Inc.  Memory Compensation Strategies  Use "WARM"  strategy.  W= write it down  A= associate it  R= repeat it  M= make a mental note  2.   You can keep a Glass blower/designer.  Use a 3-ring notebook with sections for the following: calendar, important names and phone numbers,  medications, doctors' names/phone numbers, lists/reminders, and a section to journal what you did  each day.   3.    Use a calendar to write appointments down.  4.    Write yourself a schedule for the day.  This can be placed on the calendar or in a separate section of the Memory Notebook.  Keeping a  regular schedule can help memory.  5.    Use medication organizer with sections for each day or morning/evening pills.  You may need help loading it  6.    Keep a basket, or pegboard by the door.  Place items that you need to take out with you in the basket or on the pegboard.  You may also want to  include a message board for reminders.  7.    Use sticky notes.  Place sticky notes with reminders in a place where the task is performed.  For example: " turn off the  stove" placed  by the stove, "lock the door" placed on the door at eye level, " take your medications" on  the bathroom mirror or by the place where you normally take your medications.  8.    Use alarms/timers.  Use while cooking to remind yourself to check on food or as a reminder to take your medicine, or as a  reminder to make a call, or as a reminder to perform another task, etc.

## 2021-08-01 ENCOUNTER — Telehealth: Payer: Self-pay | Admitting: Pulmonary Disease

## 2021-08-01 MED ORDER — MONTELUKAST SODIUM 10 MG PO TABS: 10.0000 mg | ORAL_TABLET | Freq: Every day | ORAL | 1 refills | Status: AC

## 2021-08-01 NOTE — Telephone Encounter (Signed)
I called the patient and let her know that a refill was sent to [facility] on [address]. Nothing further needed.

## 2021-08-08 ENCOUNTER — Other Ambulatory Visit: Payer: Self-pay | Admitting: Podiatry

## 2021-10-03 ENCOUNTER — Ambulatory Visit: Payer: Medicare Other | Admitting: Podiatry

## 2021-10-18 ENCOUNTER — Telehealth: Payer: Self-pay | Admitting: Pulmonary Disease

## 2021-10-18 NOTE — Telephone Encounter (Signed)
Called and spoke with patient's daughter Arrie Aran. She wanted to know if the patient could go back to using the [facility] 130mg inhaler. She has used this before in the past and worked well for her.   Pharmacy is [facility] [address]   AO, can you please advise? Thanks!

## 2021-10-19 ENCOUNTER — Other Ambulatory Visit: Payer: Self-pay | Admitting: Pulmonary Disease

## 2021-10-19 MED ORDER — DULERA 200-5 MCG/ACT IN AERO: 1.0000 | INHALATION_SPRAY | Freq: Two times a day (BID) | RESPIRATORY_TRACT | 3 refills | Status: DC

## 2021-10-19 NOTE — Telephone Encounter (Signed)
Attempted to call pt's daughter Arrie Aran but line went directly to VM. Left message for her to return call.

## 2021-10-19 NOTE — Telephone Encounter (Signed)
Has she been using Breztri?  Is Breztri not working?  If [name] is not working, yes may go back to using Felicia Benson, Dulera 100 may be called into pharmacy

## 2021-10-19 NOTE — Telephone Encounter (Signed)
Chrys Racer from [facility] states patient needs RX for [employer] 200 mcg. Chrys Racer phone number is [phone].

## 2021-10-19 NOTE — Telephone Encounter (Signed)
Rx for Felicia Benson has been sent to preferred pharmacy for pt. Attempted to call [name] to let her know this had been done but line went directly to VM. Left a detailed message for her letting her know this had been done. Nothing further needed.

## 2021-10-25 ENCOUNTER — Other Ambulatory Visit: Payer: Self-pay | Admitting: Internal Medicine

## 2021-10-25 ENCOUNTER — Ambulatory Visit
Admission: RE | Admit: 2021-10-25 | Discharge: 2021-10-25 | Disposition: A | Discharge status: Home / Self Care | Payer: Medicare Other | Source: Ambulatory Visit | Attending: Internal Medicine | Admitting: Internal Medicine

## 2021-10-25 DIAGNOSIS — J9621 Acute and chronic respiratory failure with hypoxia: Secondary | ICD-10-CM

## 2022-01-12 ENCOUNTER — Emergency Department (HOSPITAL_COMMUNITY)
Admission: EM | Admit: 2022-01-12 | Discharge: 2022-01-13 | Disposition: A | Discharge status: Home / Self Care | Payer: Medicare Other | Attending: Emergency Medicine | Admitting: Emergency Medicine

## 2022-01-12 ENCOUNTER — Encounter (HOSPITAL_COMMUNITY): Payer: Self-pay

## 2022-01-12 ENCOUNTER — Other Ambulatory Visit: Payer: Self-pay

## 2022-01-12 DIAGNOSIS — I509 Heart failure, unspecified: Secondary | ICD-10-CM | POA: Insufficient documentation

## 2022-01-12 DIAGNOSIS — K567 Ileus, unspecified: Secondary | ICD-10-CM | POA: Insufficient documentation

## 2022-01-12 DIAGNOSIS — I110 Hypertensive heart disease with heart failure: Secondary | ICD-10-CM | POA: Diagnosis not present

## 2022-01-12 DIAGNOSIS — D649 Anemia, unspecified: Secondary | ICD-10-CM | POA: Diagnosis not present

## 2022-01-12 DIAGNOSIS — D72829 Elevated white blood cell count, unspecified: Secondary | ICD-10-CM | POA: Insufficient documentation

## 2022-01-12 DIAGNOSIS — Z7982 Long term (current) use of aspirin: Secondary | ICD-10-CM | POA: Insufficient documentation

## 2022-01-12 DIAGNOSIS — Z79899 Other long term (current) drug therapy: Secondary | ICD-10-CM | POA: Diagnosis not present

## 2022-01-12 DIAGNOSIS — R1030 Lower abdominal pain, unspecified: Secondary | ICD-10-CM | POA: Diagnosis present

## 2022-01-12 MED ORDER — DICYCLOMINE HCL 10 MG PO CAPS
20.0000 mg | ORAL_CAPSULE | Freq: Once | ORAL | Status: AC
  Administered 2022-01-12: 20 mg via ORAL
  Filled 2022-01-12: qty 2

## 2022-01-12 MED ORDER — METOCLOPRAMIDE HCL 5 MG/ML IJ SOLN
10.0000 mg | Freq: Once | INTRAMUSCULAR | Status: AC
Start: 2022-01-12 — End: 2022-01-12
  Administered 2022-01-12: 10 mg via INTRAVENOUS
  Filled 2022-01-12: qty 2

## 2022-01-12 MED ORDER — PANTOPRAZOLE SODIUM 40 MG IV SOLR
40.0000 mg | Freq: Once | INTRAVENOUS | Status: AC
  Administered 2022-01-12: 40 mg via INTRAVENOUS
  Filled 2022-01-12: qty 10

## 2022-01-12 MED ORDER — DIPHENHYDRAMINE HCL 25 MG PO CAPS
25.0000 mg | ORAL_CAPSULE | Freq: Once | ORAL | Status: AC
  Administered 2022-01-12: 25 mg via ORAL
  Filled 2022-01-12: qty 1

## 2022-01-12 NOTE — ED Triage Notes (Signed)
Patient arrives via EMS for abdominal pain. Patient states that she began having L sided abdominal pain around noon today. She states that she ate breakfast and was fine but was having mild cramping. She states that around 1600, she went to [facility] to get her RSV shot and started feeling bad after that. She states that the pain then became generalized and was having more cramps. She states that she has had partial bowel obstructions in the past due to Crohn's disease. She states she had one episode of emesis tonight.

## 2022-01-12 NOTE — ED Provider Notes (Signed)
[facility] EMERGENCY DEPARTMENT Provider Note   CSN: [id number] Arrival date & time: 01/12/22  2153     History {Add pertinent medical, surgical, social history, OB history to HPI:1} Chief Complaint  Patient presents with   Abdominal Pain    Felicia Benson is a 89 y.o. female.   Abdominal Pain Patient is an 89 year old female with past medical history significant for Crohn's colitis, hypertension, AAA, CHF, diverticulitis, GI bleed, reflux  She is present emergency room today with complaints of lower abdominal pain onset this morning she describes it as crampy and persistent.  Does not seem to be associated with any particular aggravating or mitigating factors.  No fevers, no BRBPR, she felt somewhat weak and fatigued today.  Has not had a bowel movement but did have a normal bowel movement yesterday.  She denies any urinary symptoms such as urinary frequency urgency dysuria or hematuria.  She ambulates with a cane or walker at baseline.  She has not had any issues with mobility since her symptoms began.       Home Medications Prior to Admission medications   Medication Sig Start Date End Date Taking? Authorizing Provider  acetaminophen (TYLENOL) 325 MG tablet Take 650 mg by mouth 2 (two) times daily as needed for mild pain or headache.    [provider]  amLODipine (NORVASC) 10 MG tablet Take 1 tablet (10 mg total) by mouth every evening. Hold for next few days 06/04/15   Rai, Vernelle Emerald, MD  aspirin EC 81 MG tablet Take 1 tablet (81 mg total) by mouth daily. Swallow whole. 03/29/21   Garvin Fila, MD  atorvastatin (LIPITOR) 20 MG tablet Take 1 tablet (20 mg total) by mouth daily. Patient taking differently: Take 40 mg by mouth daily. 01/01/21 07/18/21  Amin, Jeanella Flattery, MD  Cholecalciferol (VITAMIN D3) 5000 units TABS Take 5,000 Units by mouth daily.     [provider]  cloNIDine (CATAPRES - DOSED IN MG/24 HR) 0.2 mg/24hr patch Place 0.3  mg onto the skin once a week. 12/14/20   [provider]  clotrimazole-betamethasone (LOTRISONE) cream APPLY 1 APPLICATION TOPICALLY 2 TIMES DAILY 08/08/21   Trula Slade, DPM  DULERA 200-5 MCG/ACT AERO Inhale 1 puff into the lungs 2 (two) times daily. 10/19/21   [name], Cicero Duck A, MD  esomeprazole (NEXIUM) 40 MG capsule Take 40 mg by mouth daily. 03/21/19   [provider]  furosemide (LASIX) 40 MG tablet Take 1 tablet (40 mg total) by mouth daily. May take additional 1 tablet (40 mg) for more swelling, shortness of breath or over 2 lbs weight gain 05/22/19   Mercy Riding, MD  hydroxypropyl methylcellulose / hypromellose (ISOPTO TEARS / GONIOVISC) 2.5 % ophthalmic solution Place 1 drop into both eyes 2 (two) times daily as needed for dry eyes.    [provider]  losartan (COZAAR) 25 MG tablet Take 1 tablet (25 mg total) by mouth daily. Patient taking differently: Take 100 mg by mouth daily. 05/21/19   Mercy Riding, MD  mesalamine (PENTASA) 500 MG CR capsule Take 1,500 mg by mouth 2 (two) times daily.    [provider]  metoprolol succinate (TOPROL-XL) 25 MG 24 hr tablet Take 75 mg by mouth 2 (two) times daily. 04/23/15   [provider]  montelukast (SINGULAIR) 10 MG tablet Take 1 tablet (10 mg total) by mouth daily. 08/01/21   Laurin Coder, MD  multivitamin-lutein (OCUVITE-LUTEIN) CAPS capsule Take 1  capsule by mouth daily.    [provider]  nitroGLYCERIN (NITROSTAT) 0.3 MG SL tablet Place 0.3 mg under the tongue every 5 (five) hours as needed for chest pain.    [provider]  pantoprazole (PROTONIX) 40 MG tablet Take 40 mg by mouth daily. 11/21/20   [provider]  traMADol (ULTRAM) 50 MG tablet Take 50-100 mg by mouth 2 (two) times daily as needed for moderate pain. 11/14/20   [provider]  triamcinolone cream (KENALOG) 0.1 % Apply 1 application topically 2 (two) times daily as needed (yeast  infection). Apply to skin under breast tissue    [provider]      Allergies    Ibuprofen, Ace inhibitors, and Nsaids    Review of Systems   Review of Systems  Gastrointestinal:  Positive for abdominal pain.    Physical Exam Updated Vital Signs BP 119/77 (BP Location: Left Arm)   Pulse 82   Temp 98.2 F (36.8 C) (Oral)   Resp (!) 21   Ht 5\' 6"  (1.676 m)   Wt 85.7 kg   SpO2 96%   BMI 30.51 kg/m  Physical Exam Vitals and nursing note reviewed.  Constitutional:      General: She is not in acute distress. HENT:     Head: Normocephalic and atraumatic.     Nose: Nose normal.  Eyes:     General: No scleral icterus. Cardiovascular:     Rate and Rhythm: Normal rate and regular rhythm.     Pulses: Normal pulses.     Heart sounds: Normal heart sounds.  Pulmonary:     Effort: Pulmonary effort is normal. No respiratory distress.     Breath sounds: No wheezing.  Abdominal:     Palpations: Abdomen is soft.     Tenderness: There is abdominal tenderness in the epigastric area and left lower quadrant.  Musculoskeletal:     Cervical back: Normal range of motion.     Right lower leg: No edema.     Left lower leg: No edema.  Skin:    General: Skin is warm and dry.     Capillary Refill: Capillary refill takes less than 2 seconds.  Neurological:     Mental Status: She is alert. Mental status is at baseline.  Psychiatric:        Mood and Affect: Mood normal.        Behavior: Behavior normal.     ED Results / Procedures / Treatments   Labs (all labs ordered are listed, but only abnormal results are displayed) Labs Reviewed  LIPASE, BLOOD  COMPREHENSIVE METABOLIC PANEL  CBC  URINALYSIS, ROUTINE W REFLEX MICROSCOPIC    EKG None  Radiology No results found.  Procedures Procedures  {Document cardiac monitor, telemetry assessment procedure when appropriate:1}  Medications Ordered in ED Medications  dicyclomine (BENTYL) capsule 20 mg (has no administration  in time range)  metoCLOPramide (REGLAN) injection 10 mg (has no administration in time range)  diphenhydrAMINE (BENADRYL) capsule 25 mg (has no administration in time range)  pantoprazole (PROTONIX) injection 40 mg (has no administration in time range)    ED Course/ Medical Decision Making/ A&P Clinical Course as of 01/12/22 2220  Fri Jan 12, 2022  2209 Felt fine yesterday, woke up today with cramping abd pain. Nausea + vomiting.  Felt weak and fatigued. No fever or BRBPR.  Did have a normal BM yesterday none today. No urinary symptoms. Uses a cane/walker [WF]    Clinical Course  User Index [WF] Tedd Sias, PA                           Medical Decision Making Amount and/or Complexity of Data Reviewed Labs: ordered. Radiology: ordered.  Risk Prescription drug management.   This patient presents to the ED for concern of ***, this involves a number of treatment options, and is a complaint that carries with it a *** risk of complications and morbidity. A differential diagnosis was considered for the patient's symptoms which is discussed below:   ***   Co morbidities: Discussed in HPI   Brief History:  ***    EMR reviewed including pt PMHx, past surgical history and past visits to ER.   See HPI for more details   Lab Tests:   {Blank single:19197::"I ordered and independently interpreted labs. Labs notable for","I personally reviewed all laboratory work and imaging. Metabolic panel without any acute abnormality specifically kidney function within normal limits and no significant electrolyte abnormalities. CBC without leukocytosis or significant anemia."}   Imaging Studies:  {Blank single:19197::"NAD. I personally reviewed all imaging studies and no acute abnormality found. I agree with radiology interpretation.","Abnormal findings. I personally reviewed all imaging studies. Imaging notable for","No imaging studies ordered for this patient"}    Cardiac  Monitoring:  {Blank single:19197::"The patient was maintained on a cardiac monitor.  I personally viewed and interpreted the cardiac monitored which showed an underlying rhythm of:","NA"} {Blank single:19197::"EKG non-ischemic","NA"}   Medicines ordered:  I ordered medication including ***  for *** Reevaluation of the patient after these medicines showed that the patient {resolved/improved/worsened:23923::"improved"} I have reviewed the patients home medicines and have made adjustments as needed   Critical Interventions:  ***   Consults/Attending Physician   {Blank single:19197::"I requested consultation with ***,  and discussed lab and imaging findings as well as pertinent plan - they recommend: ***","I discussed this case with my attending physician who cosigned this note including patient's presenting symptoms, physical exam, and planned diagnostics and interventions. Attending physician stated agreement with plan or made changes to plan which were implemented."}   Reevaluation:  After the interventions noted above I re-evaluated patient and found that they have :{resolved/improved/worsened:23923::"improved"}   Social Determinants of Health:  {Blank single:19197::"Given cab voucher","Social work/case management involved","The patient's social determinants of health were a factor in the care of this patient"}    Problem List / ED Course:  ***   Dispostion:  After consideration of the diagnostic results and the patients response to treatment, I feel that the patent would benefit from ***       Final Clinical Impression(s) / ED Diagnoses Final diagnoses:  None    Rx / DC Orders ED Discharge Orders     None

## 2022-01-13 ENCOUNTER — Emergency Department (HOSPITAL_COMMUNITY): Payer: Medicare Other

## 2022-01-13 DIAGNOSIS — K567 Ileus, unspecified: Secondary | ICD-10-CM | POA: Diagnosis not present

## 2022-01-13 LAB — CBC
HCT: 35.9 % — ABNORMAL LOW (ref 36.0–46.0)
Hemoglobin: 11.3 g/dL — ABNORMAL LOW (ref 12.0–15.0)
MCH: 28.5 pg (ref 26.0–34.0)
MCHC: 31.5 g/dL (ref 30.0–36.0)
MCV: 90.4 fL (ref 80.0–100.0)
Platelets: 249 10*3/uL (ref 150–400)
RBC: 3.97 MIL/uL (ref 3.87–5.11)
RDW: 17 % — ABNORMAL HIGH (ref 11.5–15.5)
WBC: 10.7 10*3/uL — ABNORMAL HIGH (ref 4.0–10.5)
nRBC: 0 % (ref 0.0–0.2)

## 2022-01-13 LAB — COMPREHENSIVE METABOLIC PANEL
ALT: 12 U/L (ref 0–44)
AST: 23 U/L (ref 15–41)
Albumin: 2.7 g/dL — ABNORMAL LOW (ref 3.5–5.0)
Alkaline Phosphatase: 71 U/L (ref 38–126)
Anion gap: 9 (ref 5–15)
BUN: 17 mg/dL (ref 8–23)
CO2: 31 mmol/L (ref 22–32)
Calcium: 8.3 mg/dL — ABNORMAL LOW (ref 8.9–10.3)
Chloride: 96 mmol/L — ABNORMAL LOW (ref 98–111)
Creatinine, Ser: 1.15 mg/dL — ABNORMAL HIGH (ref 0.44–1.00)
GFR, Estimated: 46 mL/min — ABNORMAL LOW (ref >60)
Glucose, Bld: 117 mg/dL — ABNORMAL HIGH (ref 70–99)
Potassium: 3.8 mmol/L (ref 3.5–5.1)
Sodium: 136 mmol/L (ref 135–145)
Total Bilirubin: 0.5 mg/dL (ref 0.3–1.2)
Total Protein: 6 g/dL — ABNORMAL LOW (ref 6.5–8.1)

## 2022-01-13 LAB — LIPASE, BLOOD: Lipase: 35 U/L (ref 11–51)

## 2022-01-13 MED ORDER — IOHEXOL 350 MG/ML SOLN
75.0000 mL | Freq: Once | INTRAVENOUS | Status: AC | PRN
  Administered 2022-01-13: 75 mL via INTRAVENOUS

## 2022-01-13 MED ORDER — ONDANSETRON 4 MG PO TBDP: 4.0000 mg | ORAL_TABLET | Freq: Three times a day (TID) | ORAL | 0 refills | Status: DC | PRN

## 2022-01-13 NOTE — Discharge Instructions (Addendum)
Your CT scan did not show any evidence of diverticulitis or obstruction.  It does show perhaps an early ileus.  Hydrate, liquid diet and slowly advance as we discussed.  Zofran - 4 mg as needed for nausea or vomiting.

## 2022-01-13 NOTE — ED Notes (Signed)
Report given to PTAR. Pt to return home via stretcher.

## 2022-03-01 ENCOUNTER — Ambulatory Visit: Payer: Medicare Other | Admitting: Podiatry

## 2022-03-08 ENCOUNTER — Ambulatory Visit (INDEPENDENT_AMBULATORY_CARE_PROVIDER_SITE_OTHER): Payer: Medicare Other | Admitting: Podiatry

## 2022-03-08 DIAGNOSIS — M79675 Pain in left toe(s): Secondary | ICD-10-CM | POA: Diagnosis not present

## 2022-03-08 DIAGNOSIS — M79674 Pain in right toe(s): Secondary | ICD-10-CM

## 2022-03-08 DIAGNOSIS — B351 Tinea unguium: Secondary | ICD-10-CM | POA: Diagnosis not present

## 2022-03-08 NOTE — Progress Notes (Signed)
  Subjective:  Patient ID: Felicia Benson, female    DOB: 1930-03-12,  MRN: [id number]  Chief Complaint  Patient presents with   Nail Problem    RFC Nail trim    89 y.o. female presents with the above complaint. History confirmed with patient. Patient presenting with pain related to dystrophic thickened elongated nails. Patient is unable to trim own nails related to nail dystrophy and/or mobility issues. Patient does not have a history of T2DM.   Objective:  Physical Exam: warm, good capillary refill nail exam onychomycosis of the toenails, onycholysis, and dystrophic nails DP pulses palpable, PT pulses palpable, and protective sensation intact Left Foot:  Pain with palpation of nails due to elongation and dystrophic growth.  Right Foot: Pain with palpation of nails due to elongation and dystrophic growth.   Assessment:   1. Pain due to onychomycosis of toenails of both feet   2. Dermatophytosis of nail      Plan:  Patient was evaluated and treated and all questions answered.  #Onychomycosis with pain  -Nails palliatively debrided as below. -Educated on self-care  Procedure: Nail Debridement Rationale: Pain Type of Debridement: manual, sharp debridement. Instrumentation: Nail nipper, rotary burr. Number of Nails: 10  Return in about 3 months (around 06/06/2022) for RFC .         Everitt Amber, DPM [facility]

## 2022-05-24 ENCOUNTER — Ambulatory Visit (INDEPENDENT_AMBULATORY_CARE_PROVIDER_SITE_OTHER): Payer: Medicare Other | Admitting: Podiatry

## 2022-05-24 DIAGNOSIS — M79675 Pain in left toe(s): Secondary | ICD-10-CM

## 2022-05-24 DIAGNOSIS — D689 Coagulation defect, unspecified: Secondary | ICD-10-CM

## 2022-05-24 DIAGNOSIS — B351 Tinea unguium: Secondary | ICD-10-CM

## 2022-05-24 DIAGNOSIS — M79674 Pain in right toe(s): Secondary | ICD-10-CM

## 2022-05-24 NOTE — Progress Notes (Signed)
This patient returns to my office for at risk foot care.  This patient requires this care by a professional since this patient will be at risk due to having coagulation defect.  This patient is unable to cut nails herself since the patient cannot reach her nails.These nails are painful walking and wearing shoes.  This patient presents for at risk foot care today.  General Appearance  Alert, conversant and in no acute stress.  Vascular  Dorsalis pedis and posterior tibial  pulses are  weakly palpable  bilaterally.  Capillary return is within normal limits  bilaterally. Temperature is within normal limits  bilaterally.  Neurologic  Senn-Weinstein monofilament wire test within normal limits  bilaterally. Muscle power within normal limits bilaterally.  Nails Thick disfigured discolored nails with subungual debris  from hallux to fifth toes bilaterally. No evidence of bacterial infection or drainage bilaterally.  Orthopedic  No limitations of motion  feet .  No crepitus or effusions noted.  No bony pathology or digital deformities noted.  Skin  normotropic skin with no porokeratosis noted bilaterally.  No signs of infections or ulcers noted.     Onychomycosis  Pain in right toes  Pain in left toes  Consent was obtained for treatment procedures.   Mechanical debridement of nails 1-5  bilaterally performed with a nail nipper.  Filed with dremel without incident.    Return office visit     3 months                 Told patient to return for periodic foot care and evaluation due to potential at risk complications.   Miah Boye DPM   

## 2022-06-06 ENCOUNTER — Other Ambulatory Visit: Payer: Self-pay | Admitting: Internal Medicine

## 2022-06-06 DIAGNOSIS — Z1231 Encounter for screening mammogram for malignant neoplasm of breast: Secondary | ICD-10-CM

## 2022-06-13 ENCOUNTER — Other Ambulatory Visit (HOSPITAL_COMMUNITY): Payer: Self-pay | Admitting: Gastroenterology

## 2022-06-13 DIAGNOSIS — R1013 Epigastric pain: Secondary | ICD-10-CM

## 2022-06-14 ENCOUNTER — Other Ambulatory Visit: Payer: Self-pay | Admitting: Thoracic Surgery (Cardiothoracic Vascular Surgery)

## 2022-06-14 DIAGNOSIS — I7121 Aneurysm of the ascending aorta, without rupture: Secondary | ICD-10-CM

## 2022-06-22 ENCOUNTER — Encounter (HOSPITAL_COMMUNITY): Payer: Self-pay

## 2022-06-22 ENCOUNTER — Ambulatory Visit (HOSPITAL_COMMUNITY): Admission: RE | Admit: 2022-06-22 | Payer: Medicare Other | Source: Ambulatory Visit

## 2022-06-25 ENCOUNTER — Other Ambulatory Visit: Payer: Medicare Other

## 2022-07-16 ENCOUNTER — Ambulatory Visit: Payer: Medicare Other

## 2022-07-19 ENCOUNTER — Ambulatory Visit
Admission: RE | Admit: 2022-07-19 | Discharge: 2022-07-19 | Disposition: A | Discharge status: Home / Self Care | Payer: Medicare Other | Source: Ambulatory Visit | Attending: Thoracic Surgery (Cardiothoracic Vascular Surgery) | Admitting: Thoracic Surgery (Cardiothoracic Vascular Surgery)

## 2022-07-19 DIAGNOSIS — I7121 Aneurysm of the ascending aorta, without rupture: Secondary | ICD-10-CM

## 2022-07-19 MED ORDER — IOPAMIDOL (ISOVUE-370) INJECTION 76%
75.0000 mL | Freq: Once | INTRAVENOUS | Status: AC | PRN
  Administered 2022-07-19: 75 mL via INTRAVENOUS

## 2022-07-20 ENCOUNTER — Other Ambulatory Visit: Payer: Medicare Other

## 2022-07-20 ENCOUNTER — Ambulatory Visit: Payer: Medicare Other | Admitting: Thoracic Surgery (Cardiothoracic Vascular Surgery)

## 2022-07-20 NOTE — Progress Notes (Deleted)
[address]       [place] [address]             [phone]        Felicia Benson [id number] 1930/06/23  History of Present Illness: Felicia Benson is an 89 year old female with past medical history significant for Crohn's colitis, hypertension, CHF, diverticulitis, GI bleed, reflux, CKD (stage III), thyroid nodule, pulmonary nodules, macular degeneration, meningioma, COPD (oxygen dependent), and an ascending thoracic aortic aneurysm. Her CTA on 07/12/21 showed a 4.3cm thoracic aortic aneurysm. Echocardiogram on 12/31/20 showed a tricuspid aortic valve with mild regurgitation.   She admits/denies a personal history of connective tissue disease and a family history of TAA  She admits/denies chest pain, chest tightness, SOB, dizziness, LOC.      Current Outpatient Medications on File Prior to Visit  Medication Sig Dispense Refill   acetaminophen (TYLENOL) 325 MG tablet Take 650 mg by mouth 2 (two) times daily as needed for mild pain or headache.     amLODipine (NORVASC) 10 MG tablet Take 1 tablet (10 mg total) by mouth every evening. Hold for next few days     aspirin EC 81 MG tablet Take 1 tablet (81 mg total) by mouth daily. Swallow whole. 30 tablet 11   atorvastatin (LIPITOR) 20 MG tablet Take 1 tablet (20 mg total) by mouth daily. (Patient taking differently: Take 40 mg by mouth daily.) 30 tablet 0   Cholecalciferol (VITAMIN D3) 5000 units TABS Take 5,000 Units by mouth daily.      clotrimazole-betamethasone (LOTRISONE) cream APPLY 1 APPLICATION TOPICALLY 2 TIMES DAILY 30 g 0   DULERA 200-5 MCG/ACT AERO Inhale 1 puff into the lungs 2 (two) times daily. 13 g 3   esomeprazole (NEXIUM) 40 MG capsule Take 40 mg by mouth daily.     furosemide (LASIX) 40 MG tablet Take 1 tablet (40 mg total) by mouth daily. May take additional 1 tablet (40 mg) for more swelling, shortness of breath or over 2 lbs weight gain 180 tablet 1   hydroxypropyl methylcellulose / hypromellose  (ISOPTO TEARS / GONIOVISC) 2.5 % ophthalmic solution Place 1 drop into both eyes 2 (two) times daily as needed for dry eyes.     losartan (COZAAR) 25 MG tablet Take 1 tablet (25 mg total) by mouth daily. (Patient taking differently: Take 100 mg by mouth daily.) 90 tablet 1   mesalamine (PENTASA) 500 MG CR capsule Take 1,500 mg by mouth 2 (two) times daily.     metoprolol succinate (TOPROL-XL) 25 MG 24 hr tablet Take 75 mg by mouth 2 (two) times daily.     montelukast (SINGULAIR) 10 MG tablet Take 1 tablet (10 mg total) by mouth daily. 90 tablet 1   multivitamin-lutein (OCUVITE-LUTEIN) CAPS capsule Take 1 capsule by mouth daily.     nitroGLYCERIN (NITROSTAT) 0.3 MG SL tablet Place 0.3 mg under the tongue every 5 (five) hours as needed for chest pain.     ondansetron (ZOFRAN-ODT) 4 MG disintegrating tablet Take 1 tablet (4 mg total) by mouth every 8 (eight) hours as needed for nausea or vomiting. 20 tablet 0   pantoprazole (PROTONIX) 40 MG tablet Take 40 mg by mouth daily.     traMADol (ULTRAM) 50 MG tablet Take 50-100 mg by mouth 2 (two) times daily as needed for moderate pain.     triamcinolone cream (KENALOG) 0.1 % Apply 1 application topically 2 (two) times daily as needed (yeast infection). Apply to  skin under breast tissue     No current facility-administered medications on file prior to visit.   Vitals:  Physical Exam  CTA Results: CLINICAL DATA:  Ascending thoracic aortic aneurysm.   EXAM: CT ANGIOGRAPHY CHEST WITH CONTRAST   TECHNIQUE: Multidetector CT imaging of the chest was performed using the standard protocol during bolus administration of intravenous contrast. Multiplanar CT image reconstructions and MIPs were obtained to evaluate the vascular anatomy.   RADIATION DOSE REDUCTION: This exam was performed according to the departmental dose-optimization program which includes automated exposure control, adjustment of the mA and/or kV according to patient size and/or use of  iterative reconstruction technique.   CONTRAST:  75mL ISOVUE-370 IOPAMIDOL (ISOVUE-370) INJECTION 76%   COMPARISON:  CTA chest 07/12/2021.   FINDINGS: Cardiovascular: The ascending thoracic aortic aneurysm measuring up to 4.4 cm in maximum dimension is unchanged when measured again using similar technique. There is no evidence of dissection. There is mild calcified plaque throughout the thoracic aorta. There is no evidence of pulmonary embolism. The heart size is normal. Mild coronary artery calcifications are again seen. There is no pericardial effusion. The left chest wall cardiac device and associated leads are stable.   Mediastinum/Nodes: The right thyroid lobe is enlarged and heterogeneous, unchanged going back to at least 2022. The esophagus is grossly unremarkable. There is no mediastinal, hilar, or axillary lymphadenopathy.   Lungs/Pleura: The trachea and central airways are patent.   There are small to moderate-sized right and small left pleural effusions with adjacent atelectasis. There is mild interlobular septal thickening. There is no other focal consolidation.   There are no new, enlarging, or suspicious nodules.   Upper Abdomen: A hypodense lesion in the distal pancreatic body is partially imaged but appears unchanged. A left adrenal nodule is unchanged. One year stability favors a benign adenoma.   Musculoskeletal: There is no acute osseous abnormality or suspicious osseous lesion.   Review of the MIP images confirms the above findings.   IMPRESSION: 1. Unchanged 4.4 cm ascending thoracic aortic aneurysm. Recommend continued annual follow-up. 2. Small to moderate-sized right and small left pleural effusions with adjacent atelectasis, and mild interlobular septal thickening consistent with mild pulmonary interstitial edema. Findings may reflect CHF. 3. Hypodense lesion in the distal pancreatic body is partially imaged but appears unchanged, favored benign.  A left adrenal nodule is also unchanged, likely a benign adenoma. Recommend continued attention on any follow-up imaging of the abdomen/pelvis.     Electronically Signed   By: Lesia Hausen M.D.   On: 07/19/2022 11:56  A/P: Ascending aortic aneurysm: Ms. Matis presents with a stable 4.4cm ascending aortic aneurysm.  Echocardiogram shows a tricuspid valve with evidence of mild aortic regurgitation. We discussed the natural history and and risk factors for growth of ascending aortic aneurysms.  We covered the importance of smoking cessation, tight blood pressure control, refraining from lifting heavy objects, and avoiding fluoroquinolones. The patient is aware of signs and symptoms of aortic dissection and when to present to the emergency department.  We will continue surveillance and a repeat ** was ordered for ***.   Risk Modification:  Statin:  Atorvastatin  Smoking cessation instruction/counseling given:  {CHL AMB [facility] SMOKING CESSATION COUNSELING:20758}  Patient was counseled on importance of Blood Pressure Control.  Despite Medical intervention if the patient notices persistently elevated blood pressure readings.  They are instructed to contact their Primary Care Physician  Please avoid use of Fluoroquinolones as this can potentially increase your risk of Aortic Rupture  and/or Dissection  Patient educated on signs and symptoms of Aortic Dissection, handout also provided in AVS  Jenny Reichmann, PA-C 07/20/22

## 2022-07-21 ENCOUNTER — Other Ambulatory Visit: Payer: Self-pay

## 2022-07-21 ENCOUNTER — Emergency Department (HOSPITAL_COMMUNITY): Payer: Medicare Other

## 2022-07-21 ENCOUNTER — Inpatient Hospital Stay (HOSPITAL_COMMUNITY)
Admission: EM | Admit: 2022-07-21 | Discharge: 2022-07-24 | DRG: 291 | Disposition: A | Discharge status: Home Health | Payer: Medicare Other | Attending: Internal Medicine | Admitting: Internal Medicine

## 2022-07-21 ENCOUNTER — Encounter (HOSPITAL_COMMUNITY): Payer: Self-pay

## 2022-07-21 DIAGNOSIS — K50819 Crohn's disease of both small and large intestine with unspecified complications: Secondary | ICD-10-CM | POA: Diagnosis not present

## 2022-07-21 DIAGNOSIS — I5031 Acute diastolic (congestive) heart failure: Secondary | ICD-10-CM | POA: Diagnosis not present

## 2022-07-21 DIAGNOSIS — I10 Essential (primary) hypertension: Secondary | ICD-10-CM | POA: Diagnosis present

## 2022-07-21 DIAGNOSIS — J9621 Acute and chronic respiratory failure with hypoxia: Secondary | ICD-10-CM | POA: Diagnosis present

## 2022-07-21 DIAGNOSIS — Z801 Family history of malignant neoplasm of trachea, bronchus and lung: Secondary | ICD-10-CM

## 2022-07-21 DIAGNOSIS — Z955 Presence of coronary angioplasty implant and graft: Secondary | ICD-10-CM

## 2022-07-21 DIAGNOSIS — I5033 Acute on chronic diastolic (congestive) heart failure: Secondary | ICD-10-CM | POA: Diagnosis present

## 2022-07-21 DIAGNOSIS — I2510 Atherosclerotic heart disease of native coronary artery without angina pectoris: Secondary | ICD-10-CM | POA: Diagnosis present

## 2022-07-21 DIAGNOSIS — J441 Chronic obstructive pulmonary disease with (acute) exacerbation: Secondary | ICD-10-CM | POA: Diagnosis present

## 2022-07-21 DIAGNOSIS — H3530 Unspecified macular degeneration: Secondary | ICD-10-CM | POA: Insufficient documentation

## 2022-07-21 DIAGNOSIS — R0602 Shortness of breath: Principal | ICD-10-CM

## 2022-07-21 DIAGNOSIS — G459 Transient cerebral ischemic attack, unspecified: Secondary | ICD-10-CM | POA: Diagnosis present

## 2022-07-21 DIAGNOSIS — Z1152 Encounter for screening for COVID-19: Secondary | ICD-10-CM | POA: Diagnosis not present

## 2022-07-21 DIAGNOSIS — H353190 Nonexudative age-related macular degeneration, unspecified eye, stage unspecified: Secondary | ICD-10-CM | POA: Diagnosis not present

## 2022-07-21 DIAGNOSIS — K869 Disease of pancreas, unspecified: Secondary | ICD-10-CM | POA: Insufficient documentation

## 2022-07-21 DIAGNOSIS — I130 Hypertensive heart and chronic kidney disease with heart failure and stage 1 through stage 4 chronic kidney disease, or unspecified chronic kidney disease: Secondary | ICD-10-CM | POA: Diagnosis not present

## 2022-07-21 DIAGNOSIS — D631 Anemia in chronic kidney disease: Secondary | ICD-10-CM | POA: Diagnosis present

## 2022-07-21 DIAGNOSIS — Z7982 Long term (current) use of aspirin: Secondary | ICD-10-CM | POA: Diagnosis not present

## 2022-07-21 DIAGNOSIS — Z87891 Personal history of nicotine dependence: Secondary | ICD-10-CM

## 2022-07-21 DIAGNOSIS — K8689 Other specified diseases of pancreas: Secondary | ICD-10-CM | POA: Diagnosis present

## 2022-07-21 DIAGNOSIS — Z888 Allergy status to other drugs, medicaments and biological substances status: Secondary | ICD-10-CM

## 2022-07-21 DIAGNOSIS — Z66 Do not resuscitate: Secondary | ICD-10-CM | POA: Diagnosis present

## 2022-07-21 DIAGNOSIS — D649 Anemia, unspecified: Secondary | ICD-10-CM | POA: Diagnosis present

## 2022-07-21 DIAGNOSIS — G4700 Insomnia, unspecified: Secondary | ICD-10-CM | POA: Insufficient documentation

## 2022-07-21 DIAGNOSIS — I5032 Chronic diastolic (congestive) heart failure: Secondary | ICD-10-CM | POA: Diagnosis present

## 2022-07-21 DIAGNOSIS — Z79899 Other long term (current) drug therapy: Secondary | ICD-10-CM | POA: Diagnosis not present

## 2022-07-21 DIAGNOSIS — Z823 Family history of stroke: Secondary | ICD-10-CM

## 2022-07-21 DIAGNOSIS — K219 Gastro-esophageal reflux disease without esophagitis: Secondary | ICD-10-CM | POA: Diagnosis present

## 2022-07-21 DIAGNOSIS — J9611 Chronic respiratory failure with hypoxia: Secondary | ICD-10-CM | POA: Diagnosis present

## 2022-07-21 DIAGNOSIS — Z950 Presence of cardiac pacemaker: Secondary | ICD-10-CM

## 2022-07-21 DIAGNOSIS — N1831 Chronic kidney disease, stage 3a: Secondary | ICD-10-CM | POA: Diagnosis present

## 2022-07-21 DIAGNOSIS — Z886 Allergy status to analgesic agent status: Secondary | ICD-10-CM | POA: Diagnosis not present

## 2022-07-21 DIAGNOSIS — K5090 Crohn's disease, unspecified, without complications: Secondary | ICD-10-CM | POA: Diagnosis present

## 2022-07-21 DIAGNOSIS — I7121 Aneurysm of the ascending aorta, without rupture: Secondary | ICD-10-CM | POA: Diagnosis present

## 2022-07-21 DIAGNOSIS — I712 Thoracic aortic aneurysm, without rupture, unspecified: Secondary | ICD-10-CM | POA: Insufficient documentation

## 2022-07-21 DIAGNOSIS — N1832 Chronic kidney disease, stage 3b: Secondary | ICD-10-CM | POA: Insufficient documentation

## 2022-07-21 DIAGNOSIS — Z8673 Personal history of transient ischemic attack (TIA), and cerebral infarction without residual deficits: Secondary | ICD-10-CM

## 2022-07-21 DIAGNOSIS — J449 Chronic obstructive pulmonary disease, unspecified: Secondary | ICD-10-CM | POA: Diagnosis present

## 2022-07-21 LAB — CBC WITH DIFFERENTIAL/PLATELET
Abs Immature Granulocytes: 0.03 10*3/uL (ref 0.00–0.07)
Basophils Absolute: 0 10*3/uL (ref 0.0–0.1)
Basophils Relative: 0 %
Eosinophils Absolute: 0.1 10*3/uL (ref 0.0–0.5)
Eosinophils Relative: 1 %
HCT: 36.9 % (ref 36.0–46.0)
Hemoglobin: 11.4 g/dL — ABNORMAL LOW (ref 12.0–15.0)
Immature Granulocytes: 0 %
Lymphocytes Relative: 25 %
Lymphs Abs: 2.3 10*3/uL (ref 0.7–4.0)
MCH: 29.8 pg (ref 26.0–34.0)
MCHC: 30.9 g/dL (ref 30.0–36.0)
MCV: 96.6 fL (ref 80.0–100.0)
Monocytes Absolute: 0.9 10*3/uL (ref 0.1–1.0)
Monocytes Relative: 10 %
Neutro Abs: 5.8 10*3/uL (ref 1.7–7.7)
Neutrophils Relative %: 64 %
Platelets: 314 10*3/uL (ref 150–400)
RBC: 3.82 MIL/uL — ABNORMAL LOW (ref 3.87–5.11)
RDW: 16.5 % — ABNORMAL HIGH (ref 11.5–15.5)
WBC: 9.1 10*3/uL (ref 4.0–10.5)
nRBC: 0 % (ref 0.0–0.2)

## 2022-07-21 LAB — COMPREHENSIVE METABOLIC PANEL
ALT: 14 U/L (ref 0–44)
AST: 23 U/L (ref 15–41)
Albumin: 2.7 g/dL — ABNORMAL LOW (ref 3.5–5.0)
Alkaline Phosphatase: 73 U/L (ref 38–126)
Anion gap: 10 (ref 5–15)
BUN: 12 mg/dL (ref 8–23)
CO2: 28 mmol/L (ref 22–32)
Calcium: 8.1 mg/dL — ABNORMAL LOW (ref 8.9–10.3)
Chloride: 98 mmol/L (ref 98–111)
Creatinine, Ser: 1.01 mg/dL — ABNORMAL HIGH (ref 0.44–1.00)
GFR, Estimated: 53 mL/min — ABNORMAL LOW (ref >60)
Glucose, Bld: 125 mg/dL — ABNORMAL HIGH (ref 70–99)
Potassium: 3.6 mmol/L (ref 3.5–5.1)
Sodium: 136 mmol/L (ref 135–145)
Total Bilirubin: 0.7 mg/dL (ref 0.3–1.2)
Total Protein: 6.6 g/dL (ref 6.5–8.1)

## 2022-07-21 LAB — TROPONIN I (HIGH SENSITIVITY)
Troponin I (High Sensitivity): 9 ng/L (ref <18)
Troponin I (High Sensitivity): 10 ng/L (ref <18)

## 2022-07-21 LAB — BRAIN NATRIURETIC PEPTIDE: B Natriuretic Peptide: 194.6 pg/mL — ABNORMAL HIGH (ref 0.0–100.0)

## 2022-07-21 MED ORDER — ENOXAPARIN SODIUM 40 MG/0.4ML IJ SOSY
40.0000 mg | PREFILLED_SYRINGE | INTRAMUSCULAR | Status: DC
  Filled 2022-07-21: qty 0.4

## 2022-07-21 MED ORDER — METOPROLOL SUCCINATE ER 25 MG PO TB24
75.0000 mg | ORAL_TABLET | Freq: Two times a day (BID) | ORAL | Status: DC
  Administered 2022-07-22 – 2022-07-24 (×5): 75 mg via ORAL
  Filled 2022-07-21 (×5): qty 3

## 2022-07-21 MED ORDER — MOMETASONE FURO-FORMOTEROL FUM 200-5 MCG/ACT IN AERO: 2.0000 | INHALATION_SPRAY | Freq: Two times a day (BID) | RESPIRATORY_TRACT | Status: DC

## 2022-07-21 MED ORDER — PANTOPRAZOLE SODIUM 40 MG PO TBEC
40.0000 mg | DELAYED_RELEASE_TABLET | Freq: Every day | ORAL | Status: DC
  Administered 2022-07-22: 40 mg via ORAL
  Filled 2022-07-21: qty 1

## 2022-07-21 MED ORDER — IPRATROPIUM BROMIDE 0.02 % IN SOLN
1.0000 mg | Freq: Once | RESPIRATORY_TRACT | Status: AC
  Administered 2022-07-21: 1 mg via RESPIRATORY_TRACT
  Filled 2022-07-21: qty 5

## 2022-07-21 MED ORDER — PROSIGHT PO TABS
1.0000 | ORAL_TABLET | Freq: Every day | ORAL | Status: DC
  Administered 2022-07-23 – 2022-07-24 (×2): 1 via ORAL
  Filled 2022-07-21 (×3): qty 1

## 2022-07-21 MED ORDER — ALBUTEROL SULFATE (2.5 MG/3ML) 0.083% IN NEBU
INHALATION_SOLUTION | RESPIRATORY_TRACT | Status: AC
  Filled 2022-07-21: qty 12

## 2022-07-21 MED ORDER — VITAMIN D 25 MCG (1000 UNIT) PO TABS
5000.0000 [IU] | ORAL_TABLET | Freq: Every day | ORAL | Status: DC
  Administered 2022-07-22 – 2022-07-24 (×3): 5000 [IU] via ORAL
  Filled 2022-07-21 (×3): qty 5

## 2022-07-21 MED ORDER — SODIUM CHLORIDE 0.9% FLUSH: 3.0000 mL | INTRAVENOUS | Status: DC | PRN

## 2022-07-21 MED ORDER — AMLODIPINE BESYLATE 10 MG PO TABS
10.0000 mg | ORAL_TABLET | Freq: Every evening | ORAL | Status: DC
  Administered 2022-07-22 – 2022-07-24 (×3): 10 mg via ORAL
  Filled 2022-07-21 (×3): qty 1

## 2022-07-21 MED ORDER — ATORVASTATIN CALCIUM 40 MG PO TABS
40.0000 mg | ORAL_TABLET | Freq: Every day | ORAL | Status: DC
  Administered 2022-07-22 – 2022-07-23 (×2): 40 mg via ORAL
  Filled 2022-07-21 (×3): qty 1

## 2022-07-21 MED ORDER — MONTELUKAST SODIUM 10 MG PO TABS
10.0000 mg | ORAL_TABLET | Freq: Every day | ORAL | Status: DC
  Administered 2022-07-22 – 2022-07-24 (×3): 10 mg via ORAL
  Filled 2022-07-21 (×3): qty 1

## 2022-07-21 MED ORDER — METHYLPREDNISOLONE SODIUM SUCC 125 MG IJ SOLR
125.0000 mg | Freq: Once | INTRAMUSCULAR | Status: AC
  Administered 2022-07-21: 125 mg via INTRAVENOUS
  Filled 2022-07-21: qty 2

## 2022-07-21 MED ORDER — SODIUM CHLORIDE 0.9% FLUSH
3.0000 mL | Freq: Two times a day (BID) | INTRAVENOUS | Status: DC
  Administered 2022-07-21 – 2022-07-24 (×6): 3 mL via INTRAVENOUS

## 2022-07-21 MED ORDER — FUROSEMIDE 10 MG/ML IJ SOLN
60.0000 mg | Freq: Two times a day (BID) | INTRAMUSCULAR | Status: DC
  Administered 2022-07-22 – 2022-07-24 (×5): 60 mg via INTRAVENOUS
  Filled 2022-07-21 (×5): qty 6

## 2022-07-21 MED ORDER — ALBUTEROL SULFATE (2.5 MG/3ML) 0.083% IN NEBU: 2.5000 mg | INHALATION_SOLUTION | RESPIRATORY_TRACT | Status: DC | PRN

## 2022-07-21 MED ORDER — POLYVINYL ALCOHOL 1.4 % OP SOLN: 1.0000 [drp] | Freq: Two times a day (BID) | OPHTHALMIC | Status: DC | PRN

## 2022-07-21 MED ORDER — SODIUM CHLORIDE 0.9 % IV SOLN
250.0000 mL | INTRAVENOUS | Status: DC | PRN
  Administered 2022-07-22: 250 mL via INTRAVENOUS

## 2022-07-21 MED ORDER — POTASSIUM CHLORIDE 20 MEQ PO PACK
40.0000 meq | PACK | Freq: Every day | ORAL | Status: DC
  Administered 2022-07-22 – 2022-07-24 (×3): 40 meq via ORAL
  Filled 2022-07-21 (×3): qty 2

## 2022-07-21 MED ORDER — LOSARTAN POTASSIUM 50 MG PO TABS: 25.0000 mg | ORAL_TABLET | Freq: Every day | ORAL | Status: DC

## 2022-07-21 MED ORDER — MESALAMINE ER 250 MG PO CPCR
1500.0000 mg | ORAL_CAPSULE | Freq: Two times a day (BID) | ORAL | Status: DC
  Administered 2022-07-22 – 2022-07-24 (×4): 1500 mg via ORAL
  Filled 2022-07-21 (×7): qty 6

## 2022-07-21 MED ORDER — TRIAMCINOLONE ACETONIDE 0.1 % EX CREA
1.0000 | TOPICAL_CREAM | Freq: Two times a day (BID) | CUTANEOUS | Status: DC | PRN
  Filled 2022-07-21: qty 15

## 2022-07-21 MED ORDER — ACETAMINOPHEN 325 MG PO TABS
650.0000 mg | ORAL_TABLET | ORAL | Status: DC | PRN
  Administered 2022-07-21 – 2022-07-23 (×3): 650 mg via ORAL
  Filled 2022-07-21 (×3): qty 2

## 2022-07-21 MED ORDER — CALCIUM CARBONATE ANTACID 500 MG PO CHEW
1.0000 | CHEWABLE_TABLET | Freq: Two times a day (BID) | ORAL | Status: DC
  Administered 2022-07-23 – 2022-07-24 (×4): 200 mg via ORAL
  Filled 2022-07-21 (×5): qty 1

## 2022-07-21 MED ORDER — PREDNISONE 20 MG PO TABS
40.0000 mg | ORAL_TABLET | Freq: Every day | ORAL | Status: DC
  Administered 2022-07-22 – 2022-07-24 (×3): 40 mg via ORAL
  Filled 2022-07-21 (×4): qty 2

## 2022-07-21 MED ORDER — ALBUTEROL SULFATE (2.5 MG/3ML) 0.083% IN NEBU
10.0000 mg/h | INHALATION_SOLUTION | Freq: Once | RESPIRATORY_TRACT | Status: AC
  Administered 2022-07-21: 10 mg/h via RESPIRATORY_TRACT
  Filled 2022-07-21: qty 3

## 2022-07-21 MED ORDER — MELATONIN 3 MG PO TABS
3.0000 mg | ORAL_TABLET | Freq: Every day | ORAL | Status: DC
  Administered 2022-07-21 – 2022-07-23 (×3): 3 mg via ORAL
  Filled 2022-07-21 (×3): qty 1

## 2022-07-21 MED ORDER — FUROSEMIDE 10 MG/ML IJ SOLN
60.0000 mg | Freq: Once | INTRAMUSCULAR | Status: AC
  Administered 2022-07-21: 60 mg via INTRAVENOUS
  Filled 2022-07-21: qty 6

## 2022-07-21 MED ORDER — ONDANSETRON HCL 4 MG/2ML IJ SOLN: 4.0000 mg | Freq: Four times a day (QID) | INTRAMUSCULAR | Status: DC | PRN

## 2022-07-21 NOTE — Assessment & Plan Note (Signed)
Anemia Hemoglobin is 11.4, near baseline of 11.36 months ago for outpatient follow-up

## 2022-07-21 NOTE — Assessment & Plan Note (Signed)
  History of transient ischemic attack no current neurological symptoms  Continue home aspirin 81 mg daily as well as atorvastatin 40 mg, of note record shows her taking 40 mg of the prescription is for 20 although she does have indication for high intensity statin

## 2022-07-21 NOTE — Assessment & Plan Note (Signed)
-  Continue PPI twice a day. -Lifestyle changes discussed with patient. -Continue patient follow-up with gastroenterology service. 

## 2022-07-21 NOTE — Assessment & Plan Note (Signed)
Pancreatic and adrenal lesions Noted on the CT study on 07/19/2022 needs outpatient follow-up

## 2022-07-21 NOTE — Assessment & Plan Note (Addendum)
-  Acute on chronic diastolic heart failure -Brain natretic peptide 194.6, troponin were negative -Patient denies chest pain -Previous echo demonstrating grade 1 diastolic dysfunction with preserved ejection fraction (in 2022); updated echo pending at the moment. -Continue to follow daily weights, adequate hydration and low-sodium diet -Excellent response to IV diuresis -At discharge Lasix dose adjusted to 60 mg daily -Continue close follow-up of volume status with further adjustment as required -Repeat basic metabolic panel to follow ultralights renal function.

## 2022-07-21 NOTE — Assessment & Plan Note (Signed)
Crohn's disease No current concerns continue the patient's mesalamine 1500 mg twice daily

## 2022-07-21 NOTE — ED Notes (Signed)
Pt continues to drop down to 88% on 4 lpm. Pt continues to deny any distress. Pt asked to take a few deep breaths and sat will increase to 91-92%

## 2022-07-21 NOTE — Assessment & Plan Note (Addendum)
-  Chronic obstructive pulmonary disorder, mild acute exacerbation appreciated -Continue bronchodilator management, prednisone, flutter valve and wean oxygen supplementation as tolerated; at discharge back to 3.4 L [place] supplementation. -No elevated WBCs, no fever, no productive coughing spells; holding on antibiotics at the moment.

## 2022-07-21 NOTE — ED Notes (Addendum)
Pt remains a&ox4, pwd. Pt's O2 sats are dropping into the high 80's on her 3 lpm. I placed her on 4 lpm and was able to get her sats up to 90%. She remains to drop into the high 80's despite the increase in oxygen. No increase in distress noted or reported. Pt does have a new cough that is dry and non-productive. Dr. Doran Durand notified.

## 2022-07-21 NOTE — Assessment & Plan Note (Signed)
Coronary artery disease status post RCA stent Troponin is negative, troponin negative Continue ASA and BB

## 2022-07-21 NOTE — Assessment & Plan Note (Addendum)
-  Patient reports the use of as needed Tylenol PM at home -Continue melatonin at discharge -Good sleep hygiene discussed with patient.

## 2022-07-21 NOTE — Assessment & Plan Note (Addendum)
Acute on chronic hypoxic respiratory failure -Patient reports using 3.5 L nasal cannula supplementation at baseline; stabilized and back to baseline at time of discharge. -Appears to be multifactorial in the setting of CHF and COPD exacerbation -Continue outpatient follow-up and complete treatment for CHF and COPD exacerbation as mentioned above.

## 2022-07-21 NOTE — Assessment & Plan Note (Signed)
Essential hypertension Continue patient's home amlodipine 10 mg Could be contributing to the dependent edema (less likely given that she is on ACE concomitantly) although given the x-ray findings likely her volume status is primary, continue her home losartan 25 mg daily given her renal function is at baseline as well as her metoprolol 75 mg twice daily

## 2022-07-21 NOTE — H&P (Incomplete)
History and Physical    Patient: Felicia Benson UEA:[id number] DOB: 1930-06-01 DOA: 07/21/2022 DOS: the patient was seen and examined on 07/21/2022 PCP: [name], MD  Patient coming from: Home   Chief Complaint:  Chief Complaint  Patient presents with   Shortness of Breath    Pt coming from home via EMS with increased shob, new onset exertional dyspnea x 3 days. Pt denies cp   HPI: 89 year old female with a past medical history of hypertension, diastolic heart failure, obstructive pulmonary disease, coronary artery disease status post RCA stent, chronic kidney disease, thoracic aortic aneurysm, Crohn's disease who presented to the emergency department with worsening shortness of breath of note the patient is on 3 L of home oxygen with a concentrator and has noted desaturation down to the 70s at home when walking to the bathroom she increased her home concentrator and eventually called EMS today as her shortness of breath worsened.  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  She did have an upper respiratory tract infection 6 weeks ago and a COPD exacerbation with sputum changes at that time.  She typically walks 25 feet at home and lives with her daughter.  Reportedly the patient had significant wheeze when EMS saw her quiring prolonged nebulization.  The patient also notes her pacemaker was recently replaced.  Of note in the emergency department the patient received albuterol, Lasix 60 mg once, methylprednisolone 125 mg IV  The patient reports she is DO NOT RESUSCITATE.  Case was discussed with  the ER physician.  Creatinine is noted at 1.01  Review of Systems:  Constitutional: Denies Weight Loss, Fever, Chills or Night Sweats Eyes: Denies Blurry Vision, Eye Pain or Decreased Vision ENT: Denies Sore Throat , Tinnitus, Hearing Loss Respiratory: Reports Shortness of Breath,  Wheezing but denies  Pleurisy, Cough, Hemoptysis, Cardiovascular: Denies Chest Pain, Paroxsymal Nocturnal Dyspnea, Palpitations but reports Edema Gastrointestinal: Denies Nausea, Vomiting, Diarrhea, Hematemesis, Melena All other systems were reviewed and are negative   Past Medical History:  Diagnosis Date   AAA (abdominal aortic aneurysm) (HCC)    CHF (congestive heart failure) (HCC)    Crohn's disease (HCC)    Diverticulitis    Diverticulosis    GERD (gastroesophageal reflux disease)    GI bleed    Hypertension    OSA (obstructive sleep apnea)    Pacemaker    Thyroid nodule    Past Surgical History:  Procedure Laterality Date   CHOLECYSTECTOMY     COLONOSCOPY WITH PROPOFOL N/A 06/01/2015   Procedure: COLONOSCOPY WITH PROPOFOL;  Surgeon: Charlott Rakes, MD;  Location: [facility] ENDOSCOPY;  Service: Endoscopy;  Laterality: N/A;   CORONARY ANGIOPLASTY WITH STENT PLACEMENT     ESOPHAGOGASTRODUODENOSCOPY (EGD) WITH PROPOFOL N/A 06/01/2015   Procedure: ESOPHAGOGASTRODUODENOSCOPY (EGD) WITH PROPOFOL;  Surgeon: Charlott Rakes, MD;  Location: [facility] ENDOSCOPY;  Service: Endoscopy;  Laterality: N/A;   GIVENS CAPSULE STUDY N/A 06/03/2015   Procedure: GIVENS CAPSULE STUDY;  Surgeon: Charlott Rakes, MD;  Location: [facility] ENDOSCOPY;  Service: Endoscopy;  Laterality: N/A;   PACEMAKER PLACEMENT  2015   Social  History:  reports that she has quit smoking. She has never used smokeless tobacco. She reports that she does not drink alcohol and does not use drugs.  Allergies  Allergen Reactions   Ibuprofen     Other reaction(s): gi bleed   Ace Inhibitors     Other reaction(s): cough   Nsaids Nausea And Vomiting    Pt has  preference to tylonel    Family History  Problem Relation Age of Onset   CVA Mother    Lung cancer Father    Colon cancer Neg Hx     Prior to Admission medications   Medication Sig Start Date End Date Taking? Authorizing Provider  amLODipine (NORVASC) 10 MG tablet Take 1 tablet (10 mg total) by mouth every evening. Hold for next few days 06/04/15   Rai, Delene Ruffini, MD  aspirin EC 81 MG tablet Take 1 tablet (81 mg total) by mouth daily. Swallow whole. 03/29/21   [name], MD  atorvastatin (LIPITOR) 20 MG tablet Take 1 tablet (20 mg total) by mouth daily. Patient taking differently: Take 40 mg by mouth daily. 01/01/21 07/18/21  Amin, Loura Halt, MD  Cholecalciferol (VITAMIN D3) 5000 units TABS Take 5,000 Units by mouth daily.     [provider]  esomeprazole (NEXIUM) 40 MG capsule Take 40 mg by mouth daily. 03/21/19   [provider]  hydroxypropyl methylcellulose / hypromellose (ISOPTO TEARS / GONIOVISC) 2.5 % ophthalmic solution Place 1 drop into both eyes 2 (two) times daily as needed for dry eyes.    [provider]  losartan (COZAAR) 25 MG tablet Take 1 tablet (25 mg total) by mouth daily. Patient taking differently: Take 100 mg by mouth daily. 05/21/19   Almon Hercules, MD  mesalamine (PENTASA) 500 MG CR capsule Take 1,500 mg by mouth 2 (two) times daily.    [provider]  metoprolol succinate (TOPROL-XL) 25 MG 24 hr tablet Take 75 mg by mouth 2 (two) times daily. 04/23/15   [provider]  montelukast (SINGULAIR) 10 MG tablet Take 1 tablet (10 mg total) by mouth daily. 08/01/21   [name], Minna Antis, MD  multivitamin-lutein (OCUVITE-LUTEIN) CAPS capsule Take 1 capsule by mouth daily.    [provider]  triamcinolone cream (KENALOG) 0.1 % Apply 1 application topically 2 (two) times daily as needed (yeast infection). Apply to skin under breast tissue    [provider]    Physical Exam: Vitals:   07/21/22 2100  07/21/22 2115 07/21/22 2200 07/21/22 2253  BP:  134/66 113/68 119/66  Pulse:  81 82 80  Resp:  (!) 24 (!) 25 (!) 25  Temp:    97.8 F (36.6 C)  TempSrc:    Oral  SpO2: 92%  92% 91%  Weight:      Height:      Examined room 24 at ~9:11 PM Constitutional:  Vital Signs as per [facility] than three noted] No Acute Distress Recent Incision Left Pectoral Region for Pacer clean and dry without signs of infection  Eyes:  Pink Conjunctiva and no Ptosis Neck:     Trachea Midline, Neck Symmetric Respiratory:   Respiratory Effort Labored: Intermittent Use of Respiratory Muscles when talking,No  Intercostal Retractions             Lungs bilateral crackles worse in bases to Auscultation Bilaterally Cardiovascular:   Heart Auscultated: Regular Regular without any added sounds or murmurs  Lower Extremity Edema and bruising L>R (Patient reports baseline) Gastrointestinal:  Abdomen soft and nontender without palpable masses, guarding or rebound  No Palpable Splenomegaly or Hepatomegaly Psychiatric:  Patient Orientated to Time, Place and Person Patient with appropriate mood and affect   Data Reviewed: Chest x-ray image from today independently reviewed and interpreted with bilateral pulmonary edema no pneumothorax and tortuous aorta noted Twelve-lead ECG independently reviewed and interpreted as a ventricular rate of 69 QTc of 477 and the patient has pacer spikes with capture Creatinine is noted at 1.01 D-Dimer pending  Assessment and Plan: Insomnia insomnia reportedly uses tylenol pm at home given beers and diphenhydramine ordered melatonin   Hypocalcemia Hypocalcemia Correction for the patient's level of albuminemia is 8.7 We will replace p.o.  Macular degeneration Macular degeneration At baseline patient lives with daughter reports some difficulties at home limiting her ADLs have consulted PT and OT  Pancreatic lesion Pancreatic and adrenal lesions Noted on the CT  study on 07/19/2022 needs outpatient follow-up   GERD (gastroesophageal reflux disease) Gastroesophageal reflux disease Continue the patient's home PPI   Thoracic aortic aneurysm [facility]) Thoracic aortic aneurysm Patient had a CT pulmonary angiogram which showed unchanged ascending thoracic aortic aneurysm on 07/19/2022 without any acute event Outpatient follow-up  CKD stage 3a, GFR 45-59 ml/min (HCC) Chronic kidney disease stage IIIa Creatinine is noted at 1.01, estimated GFR 53, better than her creatinine of 1.156 months ago Avoiding nephrotoxins continue ACEi given stable Repeat BMP in AM follow lytes closely, PO KCL ordered given diuresis  TIA (transient ischemic attack)  History of transient ischemic attack no current neurological symptoms  Continue home aspirin 81 mg daily as well as atorvastatin 40 mg, of note record shows her taking 40 mg of the prescription is for 20 although she does have indication for high intensity statin  Acute on chronic diastolic CHF (congestive heart failure) (HCC) Acute on chronic diastolic heart failure Brain natretic peptide 194.6, troponin is negative Grade 1 DD in 2022 with normal EF Of note CT from 07/19/2022 shows pulmonary edema, today's x-ray as well shows bilateral pulmonary edema Patient reports taking 40 mg of Lasix at home her perception is she is close to her dry weight 188 pounds although given the patient's x-ray and brain atretic peptide we will diurese with 60 mg IV twice daily We will repeat echo since last was 2022 to ensure there has been no deterioration of this individuals ejection fraction Plan: Repeat echocardiogram IV Lasix 60 mg IV twice daily, daily weights   History of cardiac pacemaker  Pacemaker, recently replaced Good capture on the patient's ECG Incision looks reassuring  COPD with acute exacerbation (HCC)  Chronic obstructive pulmonary disorder, acute exacerbation Patient reportedly had severe bilateral wheeze  requiring EMS do prolonged breathing treatments, at the time of examination in the emergency department the patient's lungs were clear <Moderate to Severe Exacerbation (Groups B, C, and D in GOLD Guidelines)  Moderate - Treat with LAMA or LABA PLUS inhaled or systemic steroids PLUS PRN SABA> Continue Dulera, which is a long-acting beta agonist and steroid of note the patient reports she does not have a short acting beta agonist at home,  given she does not have sputum or cough will not start antibiotics for this exacerbation Plan: Add albuterol nebulized every 2 hours as needed and prednisone 40 mg given the patient is already on inhaled steroids, continue the patient's home Dulera  Acute on chronic hypoxic respiratory failure (HCC)  Acute on chronic hypoxic  respiratory failure Chest x-ray reveals bilateral pulmonary edema and effusions Patient is requiring 4 L and earlier during her presentation nonrebreather to prevent dyspnea and use of accessory muscles and desaturation less than 89%, is on 3 L home oxygen and currently on 4 L desaturating with sentences This is likely multifactorial secondary to the heart failure as well as acute COPD exacerbation, no confusion or indication of hypercarbia above baseline Given lower extremity asymmetric swelling is  baseline  do not suspect pulmonary embolism given the overall clinical situation and the fact troponin is negative so we will be biomarker negative event but will obtain D-dimer for completeness  CAD S/P percutaneous coronary angioplasty Coronary artery disease status post RCA stent Troponin is negative, troponin negative Continue ASA and BB  Normocytic anemia Anemia Hemoglobin is 11.4, near baseline of 11.36 months ago for outpatient follow-up  Crohn's disease (HCC) Crohn's disease No current concerns continue the patient's mesalamine 1500 mg twice daily  Essential hypertension Essential hypertension Continue patient's home amlodipine  10 mg Could be contributing to the dependent edema (less likely given that she is on ACE concomitantly) although given the x-ray findings likely her volume status is primary, continue her home losartan 25 mg daily given her renal function is at baseline as well as her metoprolol 75 mg twice daily      Advance Care Planning:   Code Status: DNR   Consults: None, will consider cards if does not respond well to diuresis  Family Communication: Updated Daughter [name] via telephone [phone] (approx midnight)  Severity of Illness: The appropriate patient status for this patient is INPATIENT. Inpatient status is judged to be reasonable and necessary in order to provide the required intensity of service to ensure the patient's safety. The patient's presenting symptoms, physical exam findings, and initial radiographic and laboratory data in the context of their chronic comorbidities is felt to place them at high risk for further clinical deterioration. Furthermore, it is not anticipated that the patient will be medically stable for discharge from the hospital within 2 midnights of admission.    The patient has numerous comorbidities including congestive heart failure COPD advanced age chronic inflammatory disease in the setting of Crohn's disease chronic respiratory failure she is at high risk for complications and has serious physical exam signs including desaturation with basic speaking and intermittent usage of her respiratory accessory muscles.  * I certify that at the point of admission it is my clinical judgment that the patient will require inpatient hospital care spanning beyond 2 midnights from the point of admission due to high intensity of service, high risk for further deterioration and high frequency of surveillance required.*  Author: Princess Bruins, MD 07/21/2022 11:06 PM  For on call review [email or web address].

## 2022-07-21 NOTE — Assessment & Plan Note (Addendum)
-  Macular degeneration -At baseline patient lives with daughter reports some difficulties at home limiting her ADLs. -Seen by physical therapy with recommendation for home health PT/OT at discharge.

## 2022-07-21 NOTE — ED Provider Notes (Signed)
[place] EMERGENCY DEPARTMENT AT [facility] [address] Provider Note   CSN: [id number] Arrival date & time: 07/21/22  1425     History Chief Complaint  Patient presents with   Shortness of Breath    Pt coming from home via EMS with increased shob, new onset exertional dyspnea x 3 days. Pt denies cp    HPI Felicia Benson is a 89 y.o. female presenting for chief complaint shortness of breath.  States that she has had shortness of breath for the last 3 days.  Has had some worsening lower extremity edema but does not feel she has been gaining weight.  She medically compliant takes her Lasix twice daily as needed.  She denies fevers chills nausea vomiting, syncope shortness of breath.  She otherwise ambulatory tolerating p.o. intake prior to this event.  She is on 3 L nasal cannula at baseline was desatting to 70% on this at home.  Started on nonrebreather brought in for further care and management..  EF ok 2022  Patient's recorded medical, surgical, social, medication list and allergies were reviewed in the Snapshot window as part of the initial history.   Review of Systems   Review of Systems  Constitutional:  Negative for chills and fever.  HENT:  Negative for ear pain and sore throat.   Eyes:  Negative for pain and visual disturbance.  Respiratory:  Positive for cough, shortness of breath and wheezing.   Cardiovascular:  Positive for leg swelling. Negative for chest pain and palpitations.  Gastrointestinal:  Negative for abdominal pain and vomiting.  Genitourinary:  Negative for dysuria and hematuria.  Musculoskeletal:  Negative for arthralgias and back pain.  Skin:  Negative for color change and rash.  Neurological:  Negative for seizures and syncope.  All other systems reviewed and are negative.   Physical Exam Updated Vital Signs BP 134/66   Pulse 81   Temp 97.8 F (36.6 C) (Oral)   Resp (!) 24   Ht 5\' 6"  (1.676 m)   Wt 85.3 kg   SpO2 92%   BMI 30.34 kg/m   Physical Exam Vitals and nursing note reviewed.  Constitutional:      General: She is not in acute distress.    Appearance: She is well-developed.  HENT:     Head: Normocephalic and atraumatic.  Eyes:     Conjunctiva/sclera: Conjunctivae normal.  Cardiovascular:     Rate and Rhythm: Normal rate and regular rhythm.     Heart sounds: No murmur heard. Pulmonary:     Effort: Pulmonary effort is normal. Tachypnea present. No respiratory distress.     Breath sounds: Wheezing present.  Abdominal:     General: There is no distension.     Palpations: Abdomen is soft.     Tenderness: There is no abdominal tenderness. There is no right CVA tenderness or left CVA tenderness.  Musculoskeletal:        General: No swelling or tenderness. Normal range of motion.     Cervical back: Neck supple.     Right lower leg: Edema present.     Left lower leg: Edema present.  Skin:    General: Skin is warm and dry.  Neurological:     General: No focal deficit present.     Mental Status: She is alert and oriented to person, place, and time. Mental status is at baseline.     Cranial Nerves: No cranial nerve deficit.      ED Course/ Medical Decision Making/ A&P  Clinical Course as of 07/21/22 2141  Sat Jul 21, 2022  1502 Stable Primary by CRC 70% FIO in feild   [CC]    Clinical Course User Index [CC] Glyn Ade, MD    Procedures Procedures   Medications Ordered in ED Medications  albuterol (PROVENTIL) (2.5 MG/3ML) 0.083% nebulizer solution (  Not Given 07/21/22 1556)  amLODipine (NORVASC) tablet 10 mg (has no administration in time range)  atorvastatin (LIPITOR) tablet 40 mg (has no administration in time range)  losartan (COZAAR) tablet 25 mg (has no administration in time range)  metoprolol succinate (TOPROL-XL) 24 hr tablet 75 mg (has no administration in time range)  pantoprazole (PROTONIX) EC tablet 40 mg (has no administration in time range)  mesalamine (PENTASA) CR capsule  1,500 mg (has no administration in time range)  Vitamin D3 TABS 5,000 Units (has no administration in time range)  multivitamin-lutein (OCUVITE-LUTEIN) capsule 1 capsule (has no administration in time range)  montelukast (SINGULAIR) tablet 10 mg (has no administration in time range)  triamcinolone cream (KENALOG) 0.1 % cream 1 Application (has no administration in time range)  hydroxypropyl methylcellulose / hypromellose (ISOPTO TEARS / GONIOVISC) 2.5 % ophthalmic solution 1 drop (has no administration in time range)  albuterol (PROVENTIL) (2.5 MG/3ML) 0.083% nebulizer solution (10 mg/hr Nebulization Given 07/21/22 1537)  ipratropium (ATROVENT) nebulizer solution 1 mg (1 mg Nebulization Given 07/21/22 1546)  methylPREDNISolone sodium succinate (SOLU-MEDROL) 125 mg/2 mL injection 125 mg (125 mg Intravenous Given 07/21/22 1540)  furosemide (LASIX) injection 60 mg (60 mg Intravenous Given 07/21/22 1915)   Medical Decision Making:   Felicia Benson is a 89 y.o. female with a history of COPD and CHF, who presented to the ED today with acute on chronic SOB. They are endorsing worsening of their baseline dyspnea over the past 72 hours hours. Their baseline is a 3 L O2 requirement. At their baseline they are able to get around the house and they are not able to at this time.  On my initial exam, the pt was SOB and tachypneic. Audible wheezing and grossly decreased breath sounds appreciated.  They are endorsing increased sputum production and lower extremity edema.    Reviewed and confirmed nursing documentation for past medical history, family history, social history.    Initial Assessment:   With the patient's presentation of SOB in the above setting, most likely diagnosis is COPD Exacerbation with combined CHF exacerbation. Other diagnoses were considered including (but not limited to) CAP, PE, ACS, viral infection, PTX. These are considered less likely due to history of present illness and physical exam  findings.   This is most consistent with an acute life/limb threatening illness complicated by underlying chronic conditions.  Initial Plan:  Empiric treatment of patient's symptoms with immediate initiation of inhaled bronchodilators and IV steroids.  Evaluation for ACS with EKG and delta troponin  Evaluation for infectious versus intrathoracic abnormality with chest x-ray  Evaluation for volume overload with BNP  Screening labs including CBC and Metabolic panel to evaluate for infectious or metabolic etiology of disease.  Patient's Wells score is low and patient does not warrant further objective evaluation for PE based on consistency of presentation of alternative diagnosis.  Objective evaluation as below reviewed   Initial Study Results:   Laboratory  All laboratory results reviewed without evidence of clinically relevant pathology.   Exceptions include: Elevated BNP  EKG EKG was reviewed independently. Rate, rhythm, axis, intervals all examined and without medically relevant abnormality. ST segments without concerns  for elevations.    Radiology:  All images reviewed independently. Agree with radiology report at this time.   DG Chest Portable 1 View  Result Date: 07/21/2022 CLINICAL DATA:  Dyspnea. EXAM: PORTABLE CHEST 1 VIEW COMPARISON:  October 25, 2021 FINDINGS: Normal cardiac silhouette. Tortuosity and calcific atherosclerotic disease of the aorta. Hazy opacities throughout both lungs. Bilateral moderate in size pleural effusions. Postsurgical changes at the thoracic inlet. IMPRESSION: 1. Hazy opacities throughout both lungs may represent pulmonary edema. 2. Bilateral moderate in size pleural effusions. Electronically Signed   By: Ted Mcalpine M.D.   On: 07/21/2022 15:29   CT Angio Chest W/Cm &/Or Wo Cm  Result Date: 07/19/2022 CLINICAL DATA:  Ascending thoracic aortic aneurysm. EXAM: CT ANGIOGRAPHY CHEST WITH CONTRAST TECHNIQUE: Multidetector CT imaging of the chest was  performed using the standard protocol during bolus administration of intravenous contrast. Multiplanar CT image reconstructions and MIPs were obtained to evaluate the vascular anatomy. RADIATION DOSE REDUCTION: This exam was performed according to the departmental dose-optimization program which includes automated exposure control, adjustment of the mA and/or kV according to patient size and/or use of iterative reconstruction technique. CONTRAST:  75mL ISOVUE-370 IOPAMIDOL (ISOVUE-370) INJECTION 76% COMPARISON:  CTA chest 07/12/2021. FINDINGS: Cardiovascular: The ascending thoracic aortic aneurysm measuring up to 4.4 cm in maximum dimension is unchanged when measured again using similar technique. There is no evidence of dissection. There is mild calcified plaque throughout the thoracic aorta. There is no evidence of pulmonary embolism. The heart size is normal. Mild coronary artery calcifications are again seen. There is no pericardial effusion. The left chest wall cardiac device and associated leads are stable. Mediastinum/Nodes: The right thyroid lobe is enlarged and heterogeneous, unchanged going back to at least 2022. The esophagus is grossly unremarkable. There is no mediastinal, hilar, or axillary lymphadenopathy. Lungs/Pleura: The trachea and central airways are patent. There are small to moderate-sized right and small left pleural effusions with adjacent atelectasis. There is mild interlobular septal thickening. There is no other focal consolidation. There are no new, enlarging, or suspicious nodules. Upper Abdomen: A hypodense lesion in the distal pancreatic body is partially imaged but appears unchanged. A left adrenal nodule is unchanged. One year stability favors a benign adenoma. Musculoskeletal: There is no acute osseous abnormality or suspicious osseous lesion. Review of the MIP images confirms the above findings. IMPRESSION: 1. Unchanged 4.4 cm ascending thoracic aortic aneurysm. Recommend continued  annual follow-up. 2. Small to moderate-sized right and small left pleural effusions with adjacent atelectasis, and mild interlobular septal thickening consistent with mild pulmonary interstitial edema. Findings may reflect CHF. 3. Hypodense lesion in the distal pancreatic body is partially imaged but appears unchanged, favored benign. A left adrenal nodule is also unchanged, likely a benign adenoma. Recommend continued attention on any follow-up imaging of the abdomen/pelvis. Electronically Signed   By: Lesia Hausen M.D.   On: 07/19/2022 11:56      Consults: Case discussed with hospitalist.   Final Assessment and Plan:   After initiation of medical therapies, patient is grossly improved and no longer in acute distress.    A higher than baseline oxygen requirement.  Will diurese with IV Lasix and arrange for admission. Clinical Impression:  1. SOB (shortness of breath)      Data Unavailable   Final Clinical Impression(s) / ED Diagnoses Final diagnoses:  SOB (shortness of breath)    Rx / DC Orders ED Discharge Orders     None  Glyn Ade, MD 07/21/22 2141

## 2022-07-21 NOTE — Assessment & Plan Note (Signed)
  Pacemaker, recently replaced Good capture on the patient's ECG Incision looks reassuring

## 2022-07-21 NOTE — Assessment & Plan Note (Addendum)
-  Chronic kidney disease stage IIIa -Appears to be stable and currently close to her baseline -Continue to follow renal function trend, follow low-sodium diet and maintain adequate hydration. -Minimize the use of nephrotoxic agents and avoid hypotension.

## 2022-07-21 NOTE — ED Notes (Signed)
Family updated as to patient's status. [name] @ [phone] of pt's admission status, plan of care & room number.

## 2022-07-21 NOTE — Assessment & Plan Note (Signed)
Hypocalcemia Correction for the patient's level of albuminemia is 8.7 We will replace p.o.

## 2022-07-21 NOTE — Assessment & Plan Note (Signed)
Thoracic aortic aneurysm Patient had a CT pulmonary angiogram which showed unchanged ascending thoracic aortic aneurysm on 07/19/2022 without any acute event Outpatient follow-up

## 2022-07-21 NOTE — ED Notes (Signed)
ED TO INPATIENT HANDOFF REPORT  ED Nurse Name and Phone #: Morrie Sheldon [phone]  S Name/Age/Gender Felicia Benson 89 y.o. female Room/Bed: 024C/024C  Code Status   Code Status: DNR  Home/SNF/Other Home Patient oriented to: self, place, time, and situation Is this baseline? Yes   Triage Complete: Triage complete  Chief Complaint Heart failure, acute diastolic (HCC) [I50.31]  Triage Note No notes on file   Allergies Allergies  Allergen Reactions   Ibuprofen     Other reaction(s): gi bleed   Ace Inhibitors     Other reaction(s): cough   Nsaids Nausea And Vomiting    Pt has preference to tylonel    Level of Care/Admitting Diagnosis ED Disposition     ED Disposition  Admit   Condition  --   Comment  Hospital Area: [facility] [[id number]]  Level of Care: Telemetry Cardiac [[id number]]  May admit patient to [facility] or [facility] if equivalent level of care is available:: Yes  Covid Evaluation: Asymptomatic - no recent exposure (last 10 days) testing not required  Diagnosis: Heart failure, acute diastolic [facility]) [[id number]]  Admitting Physician: Princess Bruins [[id number]]  Attending Physician: Princess Bruins [[id number]]  Certification:: I certify this patient will need inpatient services for at least 2 midnights  Estimated Length of Stay: 3          B Medical/Surgery History Past Medical History:  Diagnosis Date   AAA (abdominal aortic aneurysm) (HCC)    CHF (congestive heart failure) (HCC)    Crohn's disease (HCC)    Diverticulitis    Diverticulosis    GERD (gastroesophageal reflux disease)    GI bleed    Hypertension    OSA (obstructive sleep apnea)    Pacemaker    Thyroid nodule    Past Surgical History:  Procedure Laterality Date   CHOLECYSTECTOMY     COLONOSCOPY WITH PROPOFOL N/A 06/01/2015   Procedure: COLONOSCOPY WITH PROPOFOL;  Surgeon: Charlott Rakes, MD;  Location: [facility] ENDOSCOPY;  Service: Endoscopy;  Laterality: N/A;   CORONARY  ANGIOPLASTY WITH STENT PLACEMENT     ESOPHAGOGASTRODUODENOSCOPY (EGD) WITH PROPOFOL N/A 06/01/2015   Procedure: ESOPHAGOGASTRODUODENOSCOPY (EGD) WITH PROPOFOL;  Surgeon: Charlott Rakes, MD;  Location: [facility] ENDOSCOPY;  Service: Endoscopy;  Laterality: N/A;   GIVENS CAPSULE STUDY N/A 06/03/2015   Procedure: GIVENS CAPSULE STUDY;  Surgeon: Charlott Rakes, MD;  Location[facility] ENDOSCOPY;  Service: Endoscopy;  Laterality: N/A;   PACEMAKER PLACEMENT  2015     A IV Location/Drains/Wounds Patient Lines/Drains/Airways Status     Active Line/Drains/Airways     Name Placement date Placement time Site Days   Peripheral IV 07/21/22 20 G Right Antecubital 07/21/22  1510  Antecubital  less than 1   External Urinary Catheter 07/21/22  1544  --  less than 1            Intake/Output Last 24 hours No intake or output data in the 24 hours ending 07/21/22 2201  Labs/Imaging Results for orders placed or performed during the hospital encounter of 07/21/22 (from the past 48 hour(s))  Comprehensive metabolic panel     Status: Abnormal   Collection Time: 07/21/22  3:16 PM  Result Value Ref Range   Sodium 136 135 - 145 mmol/L   Potassium 3.6 3.5 - 5.1 mmol/L   Chloride 98 98 - 111 mmol/L   CO2 28 22 - 32 mmol/L   Glucose, Bld 125 (H) 70 - 99 mg/dL    Comment: Glucose reference  range applies only to samples taken after fasting for at least 8 hours.   BUN 12 8 - 23 mg/dL   Creatinine, Ser 1.61 (H) 0.44 - 1.00 mg/dL   Calcium 8.1 (L) 8.9 - 10.3 mg/dL   Total Protein 6.6 6.5 - 8.1 g/dL   Albumin 2.7 (L) 3.5 - 5.0 g/dL   AST 23 15 - 41 U/L   ALT 14 0 - 44 U/L   Alkaline Phosphatase 73 38 - 126 U/L   Total Bilirubin 0.7 0.3 - 1.2 mg/dL   GFR, Estimated 53 (L) >60 mL/min    Comment: (NOTE) Calculated using the CKD-EPI Creatinine Equation (2021)    Anion gap 10 5 - 15    Comment: Performed at [facility], [address], [place], [place] [address]  Troponin I (High Sensitivity)     Status:  None   Collection Time: 07/21/22  3:16 PM  Result Value Ref Range   Troponin I (High Sensitivity) 9 <18 ng/L    Comment: (NOTE) Elevated high sensitivity troponin I (hsTnI) values and significant  changes across serial measurements may suggest ACS but many other  chronic and acute conditions are known to elevate hsTnI results.  Refer to the "Links" section for chest pain algorithms and additional  guidance. Performed at [facility], [address], [place], [place] [address]   CBC with Differential     Status: Abnormal   Collection Time: 07/21/22  3:16 PM  Result Value Ref Range   WBC 9.1 4.0 - 10.5 K/uL   RBC 3.82 (L) 3.87 - 5.11 MIL/uL   Hemoglobin 11.4 (L) 12.0 - 15.0 g/dL   HCT 11.9 14.7 - 82.9 %   MCV 96.6 80.0 - 100.0 fL   MCH 29.8 26.0 - 34.0 pg   MCHC 30.9 30.0 - 36.0 g/dL   RDW 56.2 (H) 13.0 - 86.5 %   Platelets 314 150 - 400 K/uL   nRBC 0.0 0.0 - 0.2 %   Neutrophils Relative % 64 %   Neutro Abs 5.8 1.7 - 7.7 K/uL   Lymphocytes Relative 25 %   Lymphs Abs 2.3 0.7 - 4.0 K/uL   Monocytes Relative 10 %   Monocytes Absolute 0.9 0.1 - 1.0 K/uL   Eosinophils Relative 1 %   Eosinophils Absolute 0.1 0.0 - 0.5 K/uL   Basophils Relative 0 %   Basophils Absolute 0.0 0.0 - 0.1 K/uL   Immature Granulocytes 0 %   Abs Immature Granulocytes 0.03 0.00 - 0.07 K/uL    Comment: Performed at [facility], [address], [place], [place] [address]  Troponin I (High Sensitivity)     Status: None   Collection Time: 07/21/22  4:56 PM  Result Value Ref Range   Troponin I (High Sensitivity) 10 <18 ng/L    Comment: (NOTE) Elevated high sensitivity troponin I (hsTnI) values and significant  changes across serial measurements may suggest ACS but many other  chronic and acute conditions are known to elevate hsTnI results.  Refer to the "Links" section for chest pain algorithms and additional  guidance. Performed at [facility], [address] [place],  [place] [address]   Brain natriuretic peptide     Status: Abnormal   Collection Time: 07/21/22  7:03 PM  Result Value Ref Range   B Natriuretic Peptide 194.6 (H) 0.0 - 100.0 pg/mL    Comment: Performed at [facility], [address] [place], [place] [address]  DG Chest Portable 1 View  Result Date: 07/21/2022 CLINICAL DATA:  Dyspnea. EXAM: PORTABLE CHEST 1 VIEW COMPARISON:  October 25, 2021 FINDINGS: Normal cardiac silhouette. Tortuosity and calcific atherosclerotic disease of the aorta. Hazy opacities throughout both lungs. Bilateral moderate in size pleural effusions. Postsurgical changes at the thoracic inlet. IMPRESSION: 1. Hazy opacities throughout both lungs may represent pulmonary edema. 2. Bilateral moderate in size pleural effusions. Electronically Signed   By: Ted Mcalpine M.D.   On: 07/21/2022 15:29    Pending Labs Unresulted Labs (From admission, onward)     Start     Ordered   07/28/22 0500  Creatinine, serum  (enoxaparin (LOVENOX)    CrCl >/= 30 ml/min)  Weekly,   R     Comments: while on enoxaparin therapy    07/21/22 2145   07/22/22 0500  CBC  Tomorrow morning,   R        07/21/22 2151   07/22/22 0500  Comprehensive metabolic panel  Tomorrow morning,   R        07/21/22 2151   07/22/22 0500  Magnesium  Tomorrow morning,   R        07/21/22 2151   07/21/22 2151  D-dimer, quantitative  Once,   R        07/21/22 2151   07/21/22 2146  HIV Antibody (routine testing w rflx)  (HIV Antibody (Routine testing w reflex) panel)  Once,   R        07/21/22 2151   07/21/22 2142  CBC  (enoxaparin (LOVENOX)    CrCl >/= 30 ml/min)  Once,   R       Comments: Baseline for enoxaparin therapy IF NOT ALREADY DRAWN.  Notify MD if PLT < 100 K.    07/21/22 2145   07/21/22 2142  Creatinine, serum  (enoxaparin (LOVENOX)    CrCl >/= 30 ml/min)  Once,   R       Comments: Baseline for enoxaparin therapy IF NOT ALREADY DRAWN.    07/21/22 2145   07/21/22 1443  Blood gas, venous (at [facility]  and [facility])  Once,   R        07/21/22 1442            Vitals/Pain Today's Vitals   07/21/22 1915 07/21/22 2015 07/21/22 2100 07/21/22 2115  BP:  (!) 110/58  134/66  Pulse:  65  81  Resp:  20  (!) 24  Temp:      TempSrc:      SpO2:  91% 92%   Weight:      Height:      PainSc: 0-No pain       Isolation Precautions No active isolations  Medications Medications  albuterol (PROVENTIL) (2.5 MG/3ML) 0.083% nebulizer solution (  Not Given 07/21/22 1556)  amLODipine (NORVASC) tablet 10 mg (has no administration in time range)  atorvastatin (LIPITOR) tablet 40 mg (has no administration in time range)  losartan (COZAAR) tablet 25 mg (has no administration in time range)  metoprolol succinate (TOPROL-XL) 24 hr tablet 75 mg (has no administration in time range)  pantoprazole (PROTONIX) EC tablet 40 mg (has no administration in time range)  mesalamine (PENTASA) CR capsule 1,500 mg (has no administration in time range)  Vitamin D3 TABS 5,000 Units (has no administration in time range)  multivitamin-lutein (OCUVITE-LUTEIN) capsule 1 capsule (has no administration in time range)  montelukast (SINGULAIR) tablet 10 mg (has no administration in time range)  triamcinolone cream (KENALOG)  0.1 % cream 1 Application (has no administration in time range)  hydroxypropyl methylcellulose / hypromellose (ISOPTO TEARS / GONIOVISC) 2.5 % ophthalmic solution 1 drop (has no administration in time range)  sodium chloride flush (NS) 0.9 % injection 3 mL (has no administration in time range)  sodium chloride flush (NS) 0.9 % injection 3 mL (has no administration in time range)  0.9 %  sodium chloride infusion (has no administration in time range)  acetaminophen (TYLENOL) tablet 650 mg (has no administration in time range)  ondansetron (ZOFRAN) injection 4 mg (has no administration in time range)  enoxaparin (LOVENOX) injection 40 mg (has no administration in time range)  furosemide (LASIX) injection 60 mg (has  no administration in time range)  mometasone-formoterol (DULERA) 200-5 MCG/ACT inhaler 2 puff (has no administration in time range)  albuterol (PROVENTIL) (2.5 MG/3ML) 0.083% nebulizer solution 2.5 mg (has no administration in time range)  predniSONE (DELTASONE) tablet 40 mg (has no administration in time range)  albuterol (PROVENTIL) (2.5 MG/3ML) 0.083% nebulizer solution (10 mg/hr Nebulization Given 07/21/22 1537)  ipratropium (ATROVENT) nebulizer solution 1 mg (1 mg Nebulization Given 07/21/22 1546)  methylPREDNISolone sodium succinate (SOLU-MEDROL) 125 mg/2 mL injection 125 mg (125 mg Intravenous Given 07/21/22 1540)  furosemide (LASIX) injection 60 mg (60 mg Intravenous Given 07/21/22 1915)    Mobility walks     Focused Assessments See chart   R Recommendations: See Admitting Provider Note  Report given to:   Additional Notes: see chart

## 2022-07-22 ENCOUNTER — Other Ambulatory Visit (HOSPITAL_COMMUNITY): Payer: Medicare Other

## 2022-07-22 DIAGNOSIS — G4700 Insomnia, unspecified: Secondary | ICD-10-CM

## 2022-07-22 DIAGNOSIS — Z950 Presence of cardiac pacemaker: Secondary | ICD-10-CM

## 2022-07-22 DIAGNOSIS — J441 Chronic obstructive pulmonary disease with (acute) exacerbation: Secondary | ICD-10-CM | POA: Diagnosis not present

## 2022-07-22 DIAGNOSIS — I10 Essential (primary) hypertension: Secondary | ICD-10-CM | POA: Diagnosis not present

## 2022-07-22 DIAGNOSIS — J9621 Acute and chronic respiratory failure with hypoxia: Secondary | ICD-10-CM

## 2022-07-22 DIAGNOSIS — N1831 Chronic kidney disease, stage 3a: Secondary | ICD-10-CM

## 2022-07-22 DIAGNOSIS — K50819 Crohn's disease of both small and large intestine with unspecified complications: Secondary | ICD-10-CM

## 2022-07-22 DIAGNOSIS — H353190 Nonexudative age-related macular degeneration, unspecified eye, stage unspecified: Secondary | ICD-10-CM | POA: Diagnosis not present

## 2022-07-22 DIAGNOSIS — I7121 Aneurysm of the ascending aorta, without rupture: Secondary | ICD-10-CM

## 2022-07-22 DIAGNOSIS — G459 Transient cerebral ischemic attack, unspecified: Secondary | ICD-10-CM

## 2022-07-22 LAB — COMPREHENSIVE METABOLIC PANEL
ALT: 12 U/L (ref 0–44)
AST: 22 U/L (ref 15–41)
Albumin: 2.5 g/dL — ABNORMAL LOW (ref 3.5–5.0)
Alkaline Phosphatase: 64 U/L (ref 38–126)
Anion gap: 16 — ABNORMAL HIGH (ref 5–15)
BUN: 13 mg/dL (ref 8–23)
CO2: 24 mmol/L (ref 22–32)
Calcium: 8 mg/dL — ABNORMAL LOW (ref 8.9–10.3)
Chloride: 97 mmol/L — ABNORMAL LOW (ref 98–111)
Creatinine, Ser: 1.37 mg/dL — ABNORMAL HIGH (ref 0.44–1.00)
GFR, Estimated: 37 mL/min — ABNORMAL LOW (ref >60)
Glucose, Bld: 207 mg/dL — ABNORMAL HIGH (ref 70–99)
Potassium: 3.6 mmol/L (ref 3.5–5.1)
Sodium: 137 mmol/L (ref 135–145)
Total Bilirubin: 0.5 mg/dL (ref 0.3–1.2)
Total Protein: 5.8 g/dL — ABNORMAL LOW (ref 6.5–8.1)

## 2022-07-22 LAB — BLOOD GAS, VENOUS
Acid-base deficit: 0.4 mmol/L (ref 0.0–2.0)
Bicarbonate: 25.9 mmol/L (ref 20.0–28.0)
Drawn by: 7341
O2 Saturation: 85.7 %
Patient temperature: 36.4
pCO2, Ven: 47 mmHg (ref 44–60)
pH, Ven: 7.35 (ref 7.25–7.43)
pO2, Ven: 51 mmHg — ABNORMAL HIGH (ref 32–45)

## 2022-07-22 LAB — CBC
HCT: 31.6 % — ABNORMAL LOW (ref 36.0–46.0)
Hemoglobin: 9.8 g/dL — ABNORMAL LOW (ref 12.0–15.0)
MCH: 29 pg (ref 26.0–34.0)
MCHC: 31 g/dL (ref 30.0–36.0)
MCV: 93.5 fL (ref 80.0–100.0)
Platelets: 274 10*3/uL (ref 150–400)
RBC: 3.38 MIL/uL — ABNORMAL LOW (ref 3.87–5.11)
RDW: 16.4 % — ABNORMAL HIGH (ref 11.5–15.5)
WBC: 7 10*3/uL (ref 4.0–10.5)
nRBC: 0 % (ref 0.0–0.2)

## 2022-07-22 LAB — HIV ANTIBODY (ROUTINE TESTING W REFLEX): HIV Screen 4th Generation wRfx: NONREACTIVE

## 2022-07-22 LAB — MAGNESIUM: Magnesium: 1.4 mg/dL — ABNORMAL LOW (ref 1.7–2.4)

## 2022-07-22 LAB — D-DIMER, QUANTITATIVE: D-Dimer, Quant: 1.46 ug/mL-FEU — ABNORMAL HIGH (ref 0.00–0.50)

## 2022-07-22 MED ORDER — ENSURE ENLIVE PO LIQD: 237.0000 mL | Freq: Two times a day (BID) | ORAL | Status: DC

## 2022-07-22 MED ORDER — MAGNESIUM SULFATE 2 GM/50ML IV SOLN
2.0000 g | Freq: Once | INTRAVENOUS | Status: AC
  Administered 2022-07-22: 2 g via INTRAVENOUS
  Filled 2022-07-22: qty 50

## 2022-07-22 MED ORDER — BUDESONIDE 0.5 MG/2ML IN SUSP
0.5000 mg | Freq: Two times a day (BID) | RESPIRATORY_TRACT | Status: DC
  Administered 2022-07-22 – 2022-07-23 (×4): 0.5 mg via RESPIRATORY_TRACT
  Filled 2022-07-22 (×5): qty 2

## 2022-07-22 MED ORDER — LOSARTAN POTASSIUM 25 MG PO TABS
12.5000 mg | ORAL_TABLET | Freq: Every day | ORAL | Status: DC
  Administered 2022-07-22 – 2022-07-24 (×3): 12.5 mg via ORAL
  Filled 2022-07-22 (×3): qty 1

## 2022-07-22 MED ORDER — PANTOPRAZOLE SODIUM 40 MG PO TBEC
40.0000 mg | DELAYED_RELEASE_TABLET | Freq: Two times a day (BID) | ORAL | Status: DC
  Administered 2022-07-22 – 2022-07-24 (×4): 40 mg via ORAL
  Filled 2022-07-22 (×4): qty 1

## 2022-07-22 MED ORDER — ARFORMOTEROL TARTRATE 15 MCG/2ML IN NEBU
15.0000 ug | INHALATION_SOLUTION | Freq: Two times a day (BID) | RESPIRATORY_TRACT | Status: DC
  Administered 2022-07-22 – 2022-07-23 (×4): 15 ug via RESPIRATORY_TRACT
  Filled 2022-07-22 (×5): qty 2

## 2022-07-22 MED ORDER — ASPIRIN 81 MG PO TBEC
81.0000 mg | DELAYED_RELEASE_TABLET | Freq: Every day | ORAL | Status: DC
  Administered 2022-07-22 – 2022-07-24 (×3): 81 mg via ORAL
  Filled 2022-07-22 (×3): qty 1

## 2022-07-22 NOTE — Evaluation (Signed)
Physical Therapy Evaluation Patient Details Name: Felicia Benson MRN: [id number] DOB: [birth year] Today's Date: 07/22/2022  History of Present Illness  89 year old female who presented to the emergency department with worsening shortness of breath of note the patient is on 3 L of home oxygen with a concentrator and has noted desaturation down to the 70s at home when walking to the bathroom she increased her home concentrator and eventually called EMS as her shortness of breath worsened; with a past medical history of hypertension, diastolic heart failure, obstructive pulmonary disease, coronary artery disease status post RCA stent, chronic kidney disease, thoracic aortic aneurysm, Crohn's disease  Clinical Impression   Pt admitted with above diagnosis. Lives at home with her daughter, in a single-level home with a few steps to enter; Prior to admission, pt was able to manage basic mobility and ADLs with modified independence, using a cane for amb mostly, but has a rollator she uses on occasion; Presents to PT with generalized weakness, SOB with activity, decr activity tolerance; showing good self-monitor for activity tolerance, started pursed lip breathing without cueing; Min assist to get up to EOB, to stand, and minguard assist to walk in the hallway (second person helpful for O2 tank, and to watch O2 sats;  Pt currently with functional limitations due to the deficits listed below (see PT Problem List). Pt will benefit from skilled PT to increase their independence and safety with mobility to allow discharge to the venue listed below.           Assistance Recommended at Discharge PRN  If plan is discharge home, recommend the following:  Can travel by private vehicle  Assistance with cooking/housework        Equipment Recommendations None recommended by PT  Recommendations for Other Services       Functional Status Assessment Patient has had a recent decline in their functional status and  demonstrates the ability to make significant improvements in function in a reasonable and predictable amount of time.     Precautions / Restrictions Precautions Precautions: Fall Restrictions Weight Bearing Restrictions: No      Mobility  Bed Mobility Overal bed mobility: Needs Assistance Bed Mobility: Supine to Sit     Supine to sit: Min assist     General bed mobility comments: for trunk elevation    Transfers Overall transfer level: Needs assistance Equipment used: Rolling walker (2 wheels) Transfers: Sit to/from Stand Sit to Stand: Min guard                Ambulation/Gait Ambulation/Gait assistance: Min guard, +2 safety/equipment Gait Distance (Feet): 55 Feet (x2; one seated rest break) Assistive device: Rolling walker (2 wheels) Gait Pattern/deviations: Step-through pattern, Trunk flexed, Decreased step length - right, Decreased step length - left       General Gait Details: Cues to self-monitor for activity tolerance; tending to keep RW slightly in front of her, but did not lose control of RW; see general comments for O2 sats  Stairs            Wheelchair Mobility     Tilt Bed    Modified Rankin (Stroke Patients Only)       Balance Overall balance assessment: Needs assistance Sitting-balance support: Feet supported Sitting balance-Leahy Scale: Good     Standing balance support: During functional activity, Reliant on assistive device for balance Standing balance-Leahy Scale: Poor Standing balance comment: reliant on RW support  Pertinent Vitals/Pain Pain Assessment Pain Assessment: No/denies pain    Home Living Family/patient expects to be discharged to:: Private residence Living Arrangements: Children (daughter and grandson) Available Help at Discharge: Family;Available 24 hours/day Type of Home: House Home Access: Stairs to enter   [employer] of Steps: 1   Home Layout: One  level Home Equipment: Agricultural consultant (2 wheels);Cane - single point;Shower seat;BSC/3in1 Additional Comments: 3L O2 baseline, wears CPAP at night    Prior Function Prior Level of Function : Needs assist             Mobility Comments: SPC mainly, RW for community mobility and "sometimes at home" ADLs Comments: ind with ADLs     Hand Dominance   Dominant Hand: Right    Extremity/Trunk Assessment   Upper Extremity Assessment Upper Extremity Assessment: Defer to OT evaluation    Lower Extremity Assessment Lower Extremity Assessment: Generalized weakness    Cervical / Trunk Assessment Cervical / Trunk Assessment: Normal  Communication   Communication: No difficulties  Cognition Arousal/Alertness: Awake/alert Behavior During Therapy: WFL for tasks assessed/performed Overall Cognitive Status: Within Functional Limits for tasks assessed                                          General Comments General comments (skin integrity, edema, etc.): with amb on 4L O2, sats took an initial big, but brief, drop to 75% (and a good pleth wave), but she regulated back up to 89-92% without the need to titrate O2 up any further; ended session in recliner, O2 sat 94% on 4 L O2    Exercises     Assessment/Plan    PT Assessment Patient needs continued PT services  PT Problem List Decreased strength;Decreased activity tolerance;Decreased balance;Decreased mobility;Cardiopulmonary status limiting activity       PT Treatment Interventions DME instruction;Gait training;Stair training;Functional mobility training;Therapeutic activities;Therapeutic exercise;Balance training;Patient/family education    PT Goals (Current goals can be found in the Care Plan section)  Acute Rehab PT Goals Patient Stated Goal: Get better and be home soo PT Goal Formulation: With patient Time For Goal Achievement: 08/05/22 Potential to Achieve Goals: Good    Frequency Min 3X/week      Co-evaluation PT/OT/SLP Co-Evaluation/Treatment: Yes Reason for Co-Treatment: For patient/therapist safety;To address functional/ADL transfers PT goals addressed during session: Mobility/safety with mobility OT goals addressed during session: ADL's and self-care       AM-PAC PT "6 Clicks" Mobility  Outcome Measure Help needed turning from your back to your side while in a flat bed without using bedrails?: None Help needed moving from lying on your back to sitting on the side of a flat bed without using bedrails?: A Little Help needed moving to and from a bed to a chair (including a wheelchair)?: A Little Help needed standing up from a chair using your arms (e.g., wheelchair or bedside chair)?: A Little Help needed to walk in hospital room?: A Little Help needed climbing 3-5 steps with a railing? : A Little 6 Click Score: 19    End of Session Equipment Utilized During Treatment: Gait belt;Oxygen Activity Tolerance: Patient tolerated treatment well Patient left: in chair;with call bell/phone within reach;with chair alarm set Nurse Communication: Mobility status PT Visit Diagnosis: Other abnormalities of gait and mobility (R26.89);Muscle weakness (generalized) (M62.81);Other (comment) (Decr functional capacity)    Time: 5621-3086 PT Time Calculation (min) (ACUTE ONLY): 30 min  Charges:   PT Evaluation $PT Eval Moderate Complexity: 1 Mod   PT General Charges $$ ACUTE PT VISIT: 1 Visit         Van Clines, PT  Acute Rehabilitation Services Office [phone] Secure Chat welcomed   Levi Aland 07/22/2022, 1:28 PM

## 2022-07-22 NOTE — Progress Notes (Signed)
Initial Nutrition Assessment  DOCUMENTATION CODES:      INTERVENTION:  Continue 2g Na diet   Trial Ensure Plus High Protein po BID, each supplement provides 350 kcal and 20 grams of protein.  Encourage po intake   Offer patient assistance with meals as needed   NUTRITION DIAGNOSIS:   Predicted suboptimal nutrient intake related to chronic illness as evidenced by other (comment) (reports of SOB, fair to poor po intake).   GOAL:   Patient will meet greater than or equal to 90% of their needs   MONITOR:   PO intake, Skin, Supplement acceptance, I & O's, Labs, Weight trends  REASON FOR ASSESSMENT:   Consult Assessment of nutrition requirement/status  ASSESSMENT:   89 y.o. female with PMHx including HTN, dCHF, HTN, COPD, CAD s/p RCA stent , CKD, thoracic aortic aneurysm, Crohn's disease who presents with worsening shortness of breath while on 3L of home O2  Retired [employer]  Lives at home with daughter   Recent replacement of pacemaker  Baseline BLE edema due to previous injury.  Patient denies any weight changes   Labs: Glu 207, Cr 1.37, Mag 1.4  Meds: aspirin, TUMs, vitamin D3, lasix, MVI, protonix, KCl, deltasone, NS   Wt: dry wt 188# per patient report  07/21/22 85.3 kg  01/12/22 85.7 kg  07/18/21 87.5 kg  07/13/21 87.1 kg  05/11/21 87.3 kg   PO: 40-65% po intake x 2 documented meals  I/O's:  +20 ml   NUTRITION - FOCUSED PHYSICAL EXAM:  RD working remotely  Diet Order:   Diet Order             Diet 2 gram sodium Room service appropriate? Yes; Fluid consistency: Thin  Diet effective now                   EDUCATION NEEDS:   No education needs have been identified at this time  Skin:  Skin Assessment: Reviewed RN Assessment  Last BM:  7/13  Height:   Ht Readings from Last 1 Encounters:  07/21/22 5\' 6"  (1.676 m)    Weight:   Wt Readings from Last 1 Encounters:  07/21/22 85.3 kg    Ideal Body Weight:     BMI:  Body mass index  is 30.34 kg/m.  Estimated Nutritional Needs:   Kcal:  4098-1191  Protein:  100-128 g  Fluid:  >/= 1.8 L    Leodis Rains, RDN, LDN  Clinical Nutrition

## 2022-07-22 NOTE — Plan of Care (Signed)
  Problem: Education: Goal: Knowledge of General Education information will improve Description: Including pain rating scale, medication(s)/side effects and non-pharmacologic comfort measures Outcome: Progressing   Problem: Clinical Measurements: Goal: Ability to maintain clinical measurements within normal limits will improve Outcome: Progressing Goal: Will remain free from infection Outcome: Progressing   

## 2022-07-22 NOTE — Progress Notes (Signed)
Progress Note   Patient: Felicia Benson:[id number] DOB: May 31, 1930 DOA: 07/21/2022     1 DOS: the patient was seen and examined on 07/22/2022   Brief hospital admission narrative course: As per H&P written by Dr. Dorna Mai on 07/21/22 89 year old female with a past medical history of hypertension, diastolic heart failure, obstructive pulmonary disease, coronary artery disease status post RCA stent, chronic kidney disease, thoracic aortic aneurysm, Crohn's disease who presented to the emergency department with worsening shortness of breath of note the patient is on 3 L of home oxygen with a concentrator and has noted desaturation down to the 70s at home when walking to the bathroom she increased her home concentrator and eventually called EMS today as her shortness of breath worsened. 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 She did have an upper respiratory tract infection 6 weeks ago and a COPD exacerbation with sputum changes at that time. She typically walks 25 feet at home and lives with her daughter. Reportedly the patient had significant wheeze when EMS saw her quiring prolonged nebulization. The patient also notes her pacemaker was recently replaced.    Assessment and Plan: * Acute on chronic hypoxic respiratory failure (HCC) Acute on chronic hypoxic respiratory failure -Patient reports using 3.5 L nasal cannula supplementation at baseline; currently requiring 4-4.5 to maintain saturations -Appears to be multifactorial in the setting of CHF and COPD exacerbation -Continue to follow clinical response  and wean off oxygen supplementation towards baseline as tolerated.  Acute on chronic diastolic CHF (congestive heart failure) (HCC) -Acute on chronic diastolic heart failure Brain natretic peptide 194.6, troponin were negative -Patient denies chest pain -Previous echo demonstrating grade 1 diastolic dysfunction with preserved ejection fraction (in 2022); updated echo pending at the moment. -Continue to follow daily weights, strict I's and O's and low-sodium diet -Continue IV diuresis and closely follow renal function and electrolytes.  COPD with acute exacerbation (HCC) -Chronic obstructive pulmonary disorder, acute exacerbation -Continue bronchodilator management, prednisone, flutter valve and wean oxygen supplementation as tolerated -No elevated WBCs, no fever, no productive coughing spells; holding on antibiotics at the moment.  Insomnia -Patient reports the use of as needed Tylenol PM at home -Continue melatonin -Good sleep hygiene discussed with patient.   Hypocalcemia -Continue oral supplementation.  Macular degeneration Macular degeneration At baseline patient lives with daughter reports some difficulties at home limiting her ADLs have consulted PT and OT  Pancreatic lesion -Pancreatic and adrenal lesions, Noted on the CT study on 07/19/2022 -Outpatient follow-up/workup to be pursuit   GERD (gastroesophageal reflux disease) -Continue PPI twice a day -Lifestyle changes discussed with patient.   Thoracic aortic aneurysm [facility]) -Patient had a CT pulmonary angiogram which showed unchanged ascending thoracic aortic aneurysm on 07/19/2022 without any acute event -Continue outpatient follow-up  CKD stage 3a, GFR 45-59 ml/min (HCC) -Chronic kidney disease stage IIIa -Appears to be stable and currently close to her baseline -Continue diuresis and follow renal function trend -Patient advised to maintain adequate oral hydration -Continue to follow strict I's and O's and urine  output. -Minimize the use of nephrotoxic agents  and avoid hypotension.  TIA (transient ischemic attack) -No acute neurologic symptoms -Continue the use of aspirin and statin.    History of cardiac pacemaker -Pacemaker, recently replaced -Good capture on the patient's ECG -Incision looks reassuring -Continue patient follow-up with cardiology service.  CAD S/P percutaneous coronary angioplasty -Coronary artery disease status post RCA stent -Negative troponin -Patient reports no chest pain -Continue risk factor modification; continue treatment with aspirin and beta-blocker.    Normocytic anemia -Appears to be anemia of chronic disease -No overt bleeding appreciated -Continue to follow hemoglobin trend/stability.  Crohn's disease (HCC) -No current concerns continue the patient's mesalamine 1500 mg twice daily -Continue outpatient follow-up with GI service.  Essential hypertension -Appears to be stable  and at baseline  -Continue current antihypertensive agents and follow vital signs. -Heart healthy diet discussed with patient.     Subjective:  Reports feeling better; no chest pain, no nausea, no vomiting, breathing is slightly better.  Still requiring 4-4.5 L nasal cannula supplementation and reports mild orthopnea.  Physical Exam: Vitals:   07/22/22 0736 07/22/22 0926 07/22/22 1058 07/22/22 1504  BP: 130/62  127/60 127/61  Pulse: 81 95 76 70  Resp: 20 17 20 18   Temp: 97.6 F (36.4 C)  98.4 F (36.9 C) 98.2 F (36.8 C)  TempSrc: Oral  Oral Oral  SpO2: 90% 94% 93% 93%  Weight:      Height:       General exam: Alert, awake, oriented x 3; no chest pain, no nausea, no vomiting. Respiratory system: Positive expiratory wheezing, decreased breath sounds at the bases and scattered rhonchi.  No using accessory muscle.  Mild tachypnea on exertion reported by nursing staff. Cardiovascular system: Rate controlled, no rubs, no gallops, no JVD. Gastrointestinal system: Abdomen  is nondistended, soft and nontender. No organomegaly or masses felt. Normal bowel sounds heard. Central nervous system: Alert and oriented. No focal neurological deficits. Extremities: No cyanosis or clubbing; trace to 1+ edema appreciated bilaterally. Skin: No petechiae. Psychiatry: Judgement and insight appear normal. Mood & affect appropriate.   Data Reviewed: CBC: White blood cell 7.0, hemoglobin 9.8 and platelet count 274 K Basic metabolic panel: Sodium 137, potassium 3.6, chloride 97, bicarb 24, BUN 13, creatinine 1.37 and GFR 37 Magnesium 1.4  Family Communication: No family at bedside.  Disposition: Status is: Inpatient Remains inpatient appropriate because: Continue IV diuresis and wean off oxygen supplementation as tolerated.   Planned Discharge Destination: Home  Time spent: 35 minutes  Author: [name], MD 07/22/2022 5:36 PM  For on call review [email or web address].

## 2022-07-22 NOTE — Evaluation (Signed)
Occupational Therapy Evaluation Patient Details Name: Felicia Benson MRN: [id number] DOB: Oct 26, 1930 Today's Date: 07/22/2022   History of Present Illness 89 year old female who presented to the emergency department with worsening shortness of breath of note the patient is on 3 L of home oxygen with a concentrator and has noted desaturation down to the 70s at home when walking to the bathroom she increased her home concentrator and eventually called EMS as her shortness of breath worsened; with a past medical history of hypertension, diastolic heart failure, obstructive pulmonary disease, coronary artery disease status post RCA stent, chronic kidney disease, thoracic aortic aneurysm, Crohn's disease   Clinical Impression   Pt reports using SPC mainly but has rollator for mobility, ind with ADLs. Lives with daughter. Pt currently needing set up - mod A for ADLs, min A for bed mobility and min guard for transfers with RW. Pt desatting to 70's briefly on 4L O2, quickly increased to high 80's and to 90's within ~2 mins, cues for pursed lip breathing. Pt presenting with impairments listed below, will follow acutely. Recommend HHOT at d/c.      Recommendations for follow up therapy are one component of a multi-disciplinary discharge planning process, led by the attending physician.  Recommendations may be updated based on patient status, additional functional criteria and insurance authorization.   Assistance Recommended at Discharge Intermittent Supervision/Assistance  Patient can return home with the following A little help with walking and/or transfers;A little help with bathing/dressing/bathroom;Assistance with cooking/housework;Direct supervision/assist for medications management;Direct supervision/assist for financial management;Assist for transportation;Help with stairs or ramp for entrance    Functional Status Assessment  Patient has had a recent decline in their functional status and  demonstrates the ability to make significant improvements in function in a reasonable and predictable amount of time.  Equipment Recommendations  None recommended by OT (pt has all needed DME)    Recommendations for Other Services PT consult     Precautions / Restrictions Precautions Precautions: Fall Restrictions Weight Bearing Restrictions: No      Mobility Bed Mobility Overal bed mobility: Needs Assistance Bed Mobility: Supine to Sit     Supine to sit: Min assist     General bed mobility comments: for trunk elevation    Transfers Overall transfer level: Needs assistance Equipment used: Rolling walker (2 wheels) Transfers: Sit to/from Stand Sit to Stand: Min guard                  Balance Overall balance assessment: Needs assistance Sitting-balance support: Feet supported Sitting balance-Leahy Scale: Good     Standing balance support: During functional activity, Reliant on assistive device for balance Standing balance-Leahy Scale: Poor Standing balance comment: reliant on RW support                           ADL either performed or assessed with clinical judgement   ADL Overall ADL's : Needs assistance/impaired Eating/Feeding: Set up;Sitting   Grooming: Set up;Standing   Upper Body Bathing: Minimal assistance;Sitting   Lower Body Bathing: Moderate assistance;Sitting/lateral leans   Upper Body Dressing : Minimal assistance;Sitting   Lower Body Dressing: Moderate assistance;Sitting/lateral leans;Sit to/from stand   Toilet Transfer: Min guard;Ambulation;Regular Toilet;Rolling walker (2 wheels)   Toileting- Clothing Manipulation and Hygiene: Min guard       Functional mobility during ADLs: Min guard;Rolling walker (2 wheels)       Vision   Additional Comments: reports having visual hallucinations at night  Perception Perception Perception Tested?: No   Praxis Praxis Praxis tested?: Not tested    Pertinent Vitals/Pain  Pain Assessment Pain Assessment: No/denies pain     Hand Dominance Right   Extremity/Trunk Assessment Upper Extremity Assessment Upper Extremity Assessment: Generalized weakness   Lower Extremity Assessment Lower Extremity Assessment: Defer to PT evaluation   Cervical / Trunk Assessment Cervical / Trunk Assessment: Normal   Communication Communication Communication: No difficulties   Cognition Arousal/Alertness: Awake/alert Behavior During Therapy: WFL for tasks assessed/performed Overall Cognitive Status: Within Functional Limits for tasks assessed                                       General Comments  SpO2 down to mid 80's at lowest on 4L O2, increased to 90's with cues for PLB    Exercises     Shoulder Instructions      Home Living Family/patient expects to be discharged to:: Private residence Living Arrangements: Children (daughter and grandson) Available Help at Discharge: Family;Available 24 hours/day Type of Home: House Home Access: Stairs to enter [employer] of Steps: 1   Home Layout: One level     Bathroom Shower/Tub: Producer, television/film/video: Handicapped height     Home Equipment: Agricultural consultant (2 wheels);Cane - single point;Shower seat;BSC/3in1   Additional Comments: 3L O2 baseline, wears CPAP at night      Prior Functioning/Environment Prior Level of Function : Needs assist             Mobility Comments: SPC mainly, RW for community mobility and "sometimes at home" ADLs Comments: ind with ADLs        OT Problem List: Decreased strength;Decreased range of motion;Decreased activity tolerance;Impaired balance (sitting and/or standing);Cardiopulmonary status limiting activity      OT Treatment/Interventions: Self-care/ADL training;Therapeutic exercise;Energy conservation;DME and/or AE instruction;Therapeutic activities;Patient/family education;Balance training    OT Goals(Current goals can be found  in the care plan section) Acute Rehab OT Goals Patient Stated Goal: none stated OT Goal Formulation: With patient Time For Goal Achievement: 08/05/22 Potential to Achieve Goals: Good ADL Goals Pt Will Perform Upper Body Dressing: with modified independence;sitting Pt Will Perform Lower Body Dressing: with modified independence;sit to/from stand;sitting/lateral leans Pt Will Transfer to Toilet: with modified independence;ambulating;regular height toilet Pt Will Perform Tub/Shower Transfer: Tub transfer;Shower transfer;with modified independence;ambulating;shower seat Additional ADL Goal #2: pt will verbalize x3 energy conservations in order to improve activity tolerance for ADLs  OT Frequency: Min 1X/week    Co-evaluation PT/OT/SLP Co-Evaluation/Treatment: Yes Reason for Co-Treatment: For patient/therapist safety;To address functional/ADL transfers   OT goals addressed during session: ADL's and self-care      AM-PAC OT "6 Clicks" Daily Activity     Outcome Measure Help from another person eating meals?: A Little Help from another person taking care of personal grooming?: A Little Help from another person toileting, which includes using toliet, bedpan, or urinal?: A Little Help from another person bathing (including washing, rinsing, drying)?: A Lot Help from another person to put on and taking off regular upper body clothing?: A Little Help from another person to put on and taking off regular lower body clothing?: A Lot 6 Click Score: 16   End of Session Equipment Utilized During Treatment: Gait belt;Rolling walker (2 wheels);Oxygen (4L) Nurse Communication: Mobility status  Activity Tolerance: Patient tolerated treatment well Patient left: with call bell/phone within reach;in chair;with chair alarm set  OT Visit Diagnosis: Unsteadiness on feet (R26.81);Other abnormalities of gait and mobility (R26.89);Muscle weakness (generalized) (M62.81)                Time: 1610-9604 OT Time  Calculation (min): 30 min Charges:  OT General Charges $OT Visit: 1 Visit OT Evaluation $OT Eval Moderate Complexity: 1 Mod  Felicia Benson, OTD, OTR/L SecureChat Preferred Acute Rehab [phone]  Carver Fila Koonce 07/22/2022, 11:52 AM

## 2022-07-23 ENCOUNTER — Ambulatory Visit: Payer: Medicare Other

## 2022-07-23 ENCOUNTER — Inpatient Hospital Stay (HOSPITAL_COMMUNITY): Payer: Medicare Other

## 2022-07-23 DIAGNOSIS — J441 Chronic obstructive pulmonary disease with (acute) exacerbation: Secondary | ICD-10-CM | POA: Diagnosis not present

## 2022-07-23 DIAGNOSIS — I5031 Acute diastolic (congestive) heart failure: Secondary | ICD-10-CM

## 2022-07-23 DIAGNOSIS — J9621 Acute and chronic respiratory failure with hypoxia: Secondary | ICD-10-CM | POA: Diagnosis not present

## 2022-07-23 DIAGNOSIS — I10 Essential (primary) hypertension: Secondary | ICD-10-CM | POA: Diagnosis not present

## 2022-07-23 DIAGNOSIS — H353190 Nonexudative age-related macular degeneration, unspecified eye, stage unspecified: Secondary | ICD-10-CM | POA: Diagnosis not present

## 2022-07-23 LAB — ECHOCARDIOGRAM COMPLETE
AR max vel: 2.45 cm2
AV Area VTI: 2.55 cm2
AV Area mean vel: 2.46 cm2
AV Mean grad: 6 mmHg
AV Peak grad: 13.1 mmHg
Ao pk vel: 1.81 m/s
Area-P 1/2: 2.66 cm2
Height: 66 in
MV VTI: 2.91 cm2
S' Lateral: 3 cm
Weight: 3224.01 oz

## 2022-07-23 LAB — BASIC METABOLIC PANEL
Anion gap: 11 (ref 5–15)
BUN: 17 mg/dL (ref 8–23)
CO2: 27 mmol/L (ref 22–32)
Calcium: 8 mg/dL — ABNORMAL LOW (ref 8.9–10.3)
Chloride: 96 mmol/L — ABNORMAL LOW (ref 98–111)
Creatinine, Ser: 1.26 mg/dL — ABNORMAL HIGH (ref 0.44–1.00)
GFR, Estimated: 41 mL/min — ABNORMAL LOW (ref >60)
Glucose, Bld: 141 mg/dL — ABNORMAL HIGH (ref 70–99)
Potassium: 4.5 mmol/L (ref 3.5–5.1)
Sodium: 134 mmol/L — ABNORMAL LOW (ref 135–145)

## 2022-07-23 LAB — MAGNESIUM: Magnesium: 1.9 mg/dL (ref 1.7–2.4)

## 2022-07-23 NOTE — Progress Notes (Signed)
Progress Note   Patient: Felicia Benson UEA:[id number] DOB: [birth year] DOA: 07/21/2022     2 DOS: the patient was seen and examined on 07/23/2022   Brief hospital admission narrative course: As per H&P written by Dr. Dorna Mai on 07/21/22 89 year old female with a past medical history of hypertension, diastolic heart failure, obstructive pulmonary disease, coronary artery disease status post RCA stent, chronic kidney disease, thoracic aortic aneurysm, Crohn's disease who presented to the emergency department with worsening shortness of breath of note the patient is on 3 L of home oxygen with a concentrator and has noted desaturation down to the 70s at home when walking to the bathroom she increased her home concentrator and eventually called EMS today as her shortness of breath worsened. 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 She did have an upper respiratory tract infection 6 weeks ago and a COPD exacerbation with sputum changes at that time. She typically walks 25 feet at home and lives with her daughter. Reportedly the patient had significant wheeze when EMS saw her quiring prolonged nebulization. The patient also notes her pacemaker was recently replaced.    Assessment and Plan: * Acute on chronic hypoxic respiratory failure (HCC) Acute on chronic hypoxic respiratory failure -Patient reports using 3.5 L nasal cannula supplementation at baseline; currently requiring 4-4.5 to maintain saturations -Appears to be multifactorial in the setting of CHF and COPD exacerbation -Continue to follow clinical response  and wean off oxygen supplementation towards baseline as tolerated.  Acute on chronic diastolic CHF (congestive heart failure) (HCC) -Acute on chronic diastolic heart failure Brain natretic peptide 194.6, troponin were negative -Patient denies chest pain -Previous echo demonstrating grade 1 diastolic dysfunction with preserved ejection fraction (in 2022); updated echo pending at the moment. -Continue to follow daily weights, strict I's and O's and low-sodium diet -Continue IV diuresis and closely follow renal function and electrolytes.  COPD with acute exacerbation (HCC) -Chronic obstructive pulmonary disorder, acute exacerbation -Continue bronchodilator management, prednisone, flutter valve and wean oxygen supplementation as tolerated -No elevated WBCs, no fever, no productive coughing spells; holding on antibiotics at the moment.  Insomnia -Patient reports the use of as needed Tylenol PM at home -Continue melatonin -Good sleep hygiene discussed with patient.   Hypocalcemia -Continue oral supplementation.  Macular degeneration Macular degeneration At baseline patient lives with daughter reports some difficulties at home limiting her ADLs have consulted PT and OT  Pancreatic lesion -Pancreatic and adrenal lesions, Noted on the CT study on 07/19/2022 -Outpatient follow-up/workup to be pursuit   GERD (gastroesophageal reflux disease) -Continue PPI twice a day -Lifestyle changes discussed with patient.   Thoracic aortic aneurysm [facility]) -Patient had a CT pulmonary angiogram which showed unchanged ascending thoracic aortic aneurysm on 07/19/2022 without any acute event -Continue outpatient follow-up  CKD stage 3a, GFR 45-59 ml/min (HCC) -Chronic kidney disease stage IIIa -Appears to be stable and currently close to her baseline -Continue diuresis and follow renal function trend -Patient advised to maintain adequate oral hydration -Continue to follow strict I's and O's and urine  output. -Minimize the use of nephrotoxic agents  and avoid hypotension.  TIA (transient ischemic attack) -No acute neurologic symptoms -Continue the use of aspirin and statin.    History of cardiac pacemaker -Pacemaker, recently replaced -Good capture on the patient's ECG -Incision looks reassuring -Continue patient follow-up with cardiology service.  CAD S/P percutaneous coronary angioplasty -Coronary artery disease status post RCA stent -Negative troponin -Patient reports no chest pain -Continue risk factor modification; continue treatment with aspirin and beta-blocker.    Normocytic anemia -Appears to be anemia of chronic disease -No overt bleeding appreciated -Continue to follow hemoglobin trend/stability.  Crohn's disease (HCC) -No current concerns continue the patient's mesalamine 1500 mg twice daily -Continue outpatient follow-up with GI service.  Essential hypertension -Appears to be stable  and at baseline  -Continue current antihypertensive agents and follow vital signs. -Heart healthy diet discussed with patient.     Subjective:  Afebrile, no chest pain, no nausea or vomiting.  Reporting good urine output.  Still short winded with activity.  Nasal cannula supplementation down to 4 L.  Overall feeling better.  Physical Exam: Vitals:   07/23/22 0725 07/23/22 1112 07/23/22 1445 07/23/22 1537  BP: 120/69 130/63 (!) 117/58   Pulse: 60 62 63   Resp: 18 (!) 21 17   Temp: 98.2 F (36.8 C) 98.1 F (36.7 C) 98.3 F (36.8 C)   TempSrc: Axillary Oral Oral   SpO2: 96% 92% 91% 93%  Weight:      Height:       General exam: Alert, awake, oriented x 3; no chest pain, no nausea or vomiting; reports feeling better and breathing easier. Respiratory system: Mild scattered wheezing appreciated ; Decreased breath sounds at the bases.  No using accessory muscle.  4 L nasal cannula supplementation in place. Cardiovascular system: Rate controlled, no rubs, no gallops, no JVD  on exam. Gastrointestinal system: Abdomen is nondistended, soft and nontender. No organomegaly or masses felt. Normal bowel sounds heard. Central nervous system: Alert and oriented. No focal neurological deficits. Extremities: No cyanosis clubbing; trace edema appreciated bilaterally. Skin: No petechiae. Psychiatry: Judgement and insight appear normal. Mood & affect appropriate.   Data Reviewed: Basic metabolic panel: Sodium 134, potassium 4.5, chloride 96, bicarb 27, BUN 17, creatinine 1.26 and GFR 41 Magnesium: 1.9 2D echo: Ejection fraction 60-65%; no regional wall motion abnormalities and mild asymmetric left ventricular hypertrophy.  Grade 1 diastolic dysfunction appreciated.  No significant valvular disorder.  Family Communication: No family at bedside.  Disposition: Status is: Inpatient Remains inpatient appropriate because: Continue IV diuresis and wean off oxygen supplementation as tolerated.   Planned Discharge Destination: Home  Time spent: 35 minutes  Author: [name], MD 07/23/2022 4:12 PM  For on call review [email or web address].

## 2022-07-23 NOTE — Progress Notes (Signed)
Mobility Specialist: Progress Note    07/23/22 1145  Mobility  Activity Ambulated with assistance in hallway  Level of Assistance Contact guard assist, steadying assist  Assistive Device Front wheel walker  Distance Ambulated (ft) 300 ft  Activity Response Tolerated well  Mobility Referral Yes  $Mobility charge 1 Mobility  Mobility Specialist Start Time (ACUTE ONLY) 1130  Mobility Specialist Stop Time (ACUTE ONLY) 1145  Mobility Specialist Time Calculation (min) (ACUTE ONLY) 15 min   Pre-mobility: SpO2 91% 4LO2 During mobility: SpO2 88-93% 4LO2 with reliable pleth Post-mobility: SpO2 92% 4LO2  Pt was agreeable to mobility session. Desat during ambulation on 4LO2, but returned after brief rest and continued walk. No c/o throughout. Left in chair with all needs met. Chair alarm on.   Maurene Capes Mobility Specialist Please contact via SecureChat or Rehab office at [phone]

## 2022-07-23 NOTE — Progress Notes (Signed)
Echocardiogram 2D Echocardiogram has been performed.  Felicia Benson 07/23/2022, 1:28 PM

## 2022-07-23 NOTE — Plan of Care (Signed)
  Problem: Education: Goal: Knowledge of General Education information will improve Description: Including pain rating scale, medication(s)/side effects and non-pharmacologic comfort measures Outcome: Progressing   Problem: Clinical Measurements: Goal: Ability to maintain clinical measurements within normal limits will improve Outcome: Progressing   Problem: Clinical Measurements: Goal: Respiratory complications will improve Outcome: Progressing   Problem: Clinical Measurements: Goal: Diagnostic test results will improve Outcome: Progressing

## 2022-07-24 ENCOUNTER — Ambulatory Visit (HOSPITAL_COMMUNITY): Payer: Medicare Other

## 2022-07-24 DIAGNOSIS — H353190 Nonexudative age-related macular degeneration, unspecified eye, stage unspecified: Secondary | ICD-10-CM | POA: Diagnosis not present

## 2022-07-24 DIAGNOSIS — K219 Gastro-esophageal reflux disease without esophagitis: Secondary | ICD-10-CM

## 2022-07-24 DIAGNOSIS — J441 Chronic obstructive pulmonary disease with (acute) exacerbation: Secondary | ICD-10-CM | POA: Diagnosis not present

## 2022-07-24 DIAGNOSIS — J9621 Acute and chronic respiratory failure with hypoxia: Secondary | ICD-10-CM | POA: Diagnosis not present

## 2022-07-24 DIAGNOSIS — K869 Disease of pancreas, unspecified: Secondary | ICD-10-CM

## 2022-07-24 LAB — BASIC METABOLIC PANEL
Anion gap: 7 (ref 5–15)
BUN: 29 mg/dL — ABNORMAL HIGH (ref 8–23)
CO2: 33 mmol/L — ABNORMAL HIGH (ref 22–32)
Calcium: 8.1 mg/dL — ABNORMAL LOW (ref 8.9–10.3)
Chloride: 93 mmol/L — ABNORMAL LOW (ref 98–111)
Creatinine, Ser: 1.48 mg/dL — ABNORMAL HIGH (ref 0.44–1.00)
GFR, Estimated: 33 mL/min — ABNORMAL LOW (ref >60)
Glucose, Bld: 126 mg/dL — ABNORMAL HIGH (ref 70–99)
Potassium: 4.1 mmol/L (ref 3.5–5.1)
Sodium: 133 mmol/L — ABNORMAL LOW (ref 135–145)

## 2022-07-24 MED ORDER — ALBUTEROL SULFATE HFA 108 (90 BASE) MCG/ACT IN AERS: 2.0000 | INHALATION_SPRAY | Freq: Four times a day (QID) | RESPIRATORY_TRACT | 2 refills | Status: DC | PRN

## 2022-07-24 MED ORDER — MOMETASONE FURO-FORMOTEROL FUM 200-5 MCG/ACT IN AERO: 2.0000 | INHALATION_SPRAY | Freq: Two times a day (BID) | RESPIRATORY_TRACT | 2 refills | Status: DC

## 2022-07-24 MED ORDER — HYPROMELLOSE (GONIOSCOPIC) 2.5 % OP SOLN: 1.0000 [drp] | Freq: Two times a day (BID) | OPHTHALMIC | 1 refills | Status: DC | PRN

## 2022-07-24 MED ORDER — FUROSEMIDE 40 MG PO TABS: 60.0000 mg | ORAL_TABLET | Freq: Every day | ORAL | 2 refills | Status: DC

## 2022-07-24 MED ORDER — OCUVITE-LUTEIN PO CAPS: 1.0000 | ORAL_CAPSULE | Freq: Every day | ORAL | Status: DC

## 2022-07-24 MED ORDER — LOSARTAN POTASSIUM 25 MG PO TABS: 12.5000 mg | ORAL_TABLET | Freq: Every day | ORAL | 1 refills | Status: DC

## 2022-07-24 MED ORDER — MELATONIN 3 MG PO TABS: 3.0000 mg | ORAL_TABLET | Freq: Every day | ORAL | 1 refills | Status: DC

## 2022-07-24 MED ORDER — AMLODIPINE BESYLATE 10 MG PO TABS: 10.0000 mg | ORAL_TABLET | Freq: Every evening | ORAL | Status: DC

## 2022-07-24 MED ORDER — PREDNISONE 20 MG PO TABS: ORAL_TABLET | ORAL | 0 refills | Status: DC

## 2022-07-24 MED ORDER — POTASSIUM CHLORIDE 20 MEQ PO PACK: 20.0000 meq | PACK | Freq: Every day | ORAL | 0 refills | Status: DC

## 2022-07-24 MED ORDER — POLYETHYLENE GLYCOL 3350 17 G PO PACK: 17.0000 g | PACK | Freq: Every day | ORAL | 0 refills | Status: DC | PRN

## 2022-07-24 MED ORDER — DOCUSATE SODIUM 100 MG PO CAPS: 200.0000 mg | ORAL_CAPSULE | Freq: Two times a day (BID) | ORAL | 0 refills | Status: DC

## 2022-07-24 NOTE — TOC Initial Note (Addendum)
Transition of Care [facility]) - Initial/Assessment Note    Patient Details  Name: Felicia Benson MRN: [id number] Date of Birth: 1930/02/17  Transition of Care [facility] CM/SW Contact:    Leone Haven, RN Phone Number: 07/24/2022, 2:39 PM  Clinical Narrative:                 From home with her daughter, Alvis Lemmings, she has PCP and insurance on file.  She states she recently had HH services set up with [facility].  She currently does not have any HH services in place .  She has a walker (rollator), cane, bsc, 3 n 1, grab bars, home oxygen, baseline is 3 liters now up to 4 liters with [employer].   Patient states Alvis Lemmings will transport her home at dc today.  [name] is also her support system, she is home alone during the day, and she gets her medications from [facility] [place] [address].  NCM offered choice, patient states she would ike [facility], NCM made referral to [facility] for HHRN, HHPT, HHOT.  Morrie Sheldon is able to taker referral.    Expected Discharge Plan: Home w Home Health Services Barriers to Discharge: No Barriers Identified   Patient Goals and CMS Choice Patient states their goals for this hospitalization and ongoing recovery are:: return home with [facility] CMS Medicare.gov Compare Post Acute Care list provided to:: Patient Choice offered to / list presented to : Patient      Expected Discharge Plan and Services   Discharge Planning Services: CM Consult Post Acute Care Choice: Home Health Living arrangements for the past 2 months: Single Family Home Expected Discharge Date: 07/24/22               DME Arranged: N/A DME Agency: NA       HH Arranged: RN, PT, OT HH Agency: [facility] Date HH Agency Contacted: 07/24/22 Time HH Agency Contacted: 1437 Representative spoke with at [facility] Agency: Morrie Sheldon  Prior Living Arrangements/Services Living arrangements for the past 2 months: Single Family Home Lives with:: Adult Children Patient language and need for interpreter reviewed::  Yes Do you feel safe going back to the place where you live?: Yes      Need for Family Participation in Patient Care: Yes (Comment) Care giver support system in place?: Yes (comment) Current home services: DME (walker(rollator), cane, bsc, 3 n 1, shower stool, recliner) Criminal Activity/Legal Involvement Pertinent to Current Situation/Hospitalization: No - Comment as needed  Activities of Daily Living Home Assistive Devices/Equipment: Eyeglasses, Oxygen, Walker (specify type) ADL Screening (condition at time of admission) Patient's cognitive ability adequate to safely complete daily activities?: Yes Is the patient deaf or have difficulty hearing?: No Does the patient have difficulty seeing, even when wearing glasses/contacts?: Yes Does the patient have difficulty concentrating, remembering, or making decisions?: No Patient able to express need for assistance with ADLs?: Yes Does the patient have difficulty dressing or bathing?: Yes Independently performs ADLs?: Yes (appropriate for developmental age) Communication: Independent Dressing (OT): Needs assistance Is this a change from baseline?: Change from baseline, expected to last <3days Grooming: Independent Feeding: Independent Bathing: Needs assistance Is this a change from baseline?: Change from baseline, expected to last <3 days Toileting: Independent In/Out Bed: Needs assistance Is this a change from baseline?: Change from baseline, expected to last >3 days Walks in Home: Independent with device (comment) (walker) Does the patient have difficulty walking or climbing stairs?: Yes Weakness of Legs: Both Weakness of Arms/Hands: None  Permission Sought/Granted Permission  sought to share information with : Case Manager, Family Supports Permission granted to share information with : Yes, Verbal Permission Granted              Emotional Assessment Appearance:: Appears stated age Attitude/Demeanor/Rapport: Engaged Affect  (typically observed): Appropriate Orientation: : Oriented to Self, Oriented to Place, Oriented to  Time, Oriented to Situation Alcohol / Substance Use: Not Applicable Psych Involvement: No (comment)  Admission diagnosis:  SOB (shortness of breath) [R06.02] Heart failure, acute diastolic (HCC) [I50.31] Patient Active Problem List   Diagnosis Date Noted   Heart failure, acute diastolic (HCC) 07/21/2022   CKD stage 3a, GFR 45-59 ml/min (HCC) 07/21/2022   Thoracic aortic aneurysm (HCC) 07/21/2022   GERD (gastroesophageal reflux disease) 07/21/2022   Pancreatic lesion 07/21/2022   Macular degeneration 07/21/2022   Hypocalcemia 07/21/2022   Insomnia 07/21/2022   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   TIA (transient ischemic attack) 12/31/2020   Acute on chronic diastolic CHF (congestive heart failure) (HCC) 05/19/2019   Acute CHF (congestive heart failure) (HCC) 05/18/2019   Acute on chronic hypoxic respiratory failure (HCC) 05/18/2019   COPD with acute exacerbation (HCC) 05/18/2019   History of cardiac pacemaker 05/18/2019   AAA (abdominal aortic aneurysm) 05/18/2019   Hyperlipidemia 05/18/2019   Abdominal pain in female 05/30/2015   Essential hypertension 05/30/2015   Crohn's disease (HCC) 05/30/2015   Gastrointestinal hemorrhage with melena 05/30/2015   Blood loss anemia 05/30/2015   Hyponatremia 05/30/2015   Kidney disease 05/30/2015   Normocytic anemia 05/30/2015   CAD S/P percutaneous coronary angioplasty 05/30/2015   Acute blood loss anemia 05/30/2015   Heme + stool    PCP:  [name], MD Pharmacy:   [facility] [address], [place] - [address] [place] [address] Phone: [phone] Fax: [phone]  [facility] [address] [place] [address] Phone: [phone] Fax: [phone]     Social Determinants of Health (SDOH) Social History: SDOH Screenings   Tobacco Use:  Medium Risk (07/21/2022)   SDOH Interventions:     Readmission Risk Interventions     No data to display

## 2022-07-24 NOTE — Progress Notes (Signed)
Physical Therapy Treatment Patient Details Name: Felicia Benson MRN: [id number] DOB: 08-09-1930 Today's Date: 07/24/2022   History of Present Illness 89 year old female who presented to the emergency department with worsening shortness of breath of note the patient is on 3 L of home oxygen with a concentrator and has noted desaturation down to the 70s at home when walking to the bathroom she increased her home concentrator and eventually called EMS as her shortness of breath worsened; with a past medical history of hypertension, diastolic heart failure, obstructive pulmonary disease, coronary artery disease status post RCA stent, chronic kidney disease, thoracic aortic aneurysm, Crohn's disease    PT Comments  Pt reporting feeling better than this am, had large BM and is breathing well. Pt ambulatory around the unit at supervision level, no physical assist is needed at any time during session. Pt remains slightly deconditioned vs baseline, continuing to recommend [facility] services, pt in agreement.             SPO2 93% and greater on 4LO2, HR stable    Assistance Recommended at Discharge Set up Supervision/Assistance  If plan is discharge home, recommend the following:  Can travel by private vehicle    Assistance with cooking/housework      Equipment Recommendations  None recommended by PT    Recommendations for Other Services       Precautions / Restrictions Precautions Precautions: Fall Restrictions Weight Bearing Restrictions: No     Mobility  Bed Mobility Overal bed mobility: Needs Assistance Bed Mobility: Supine to Sit, Sit to Supine     Supine to sit: Supervision, HOB elevated Sit to supine: Supervision, HOB elevated        Transfers Overall transfer level: Needs assistance Equipment used: Rolling walker (2 wheels) Transfers: Sit to/from Stand Sit to Stand: Supervision           General transfer comment: safety    Ambulation/Gait Ambulation/Gait  assistance: Supervision Gait Distance (Feet): 200 Feet Assistive device: Rolling walker (2 wheels) Gait Pattern/deviations: Step-through pattern, Trunk flexed, Decreased stride length Gait velocity: decr     General Gait Details: cues for placement in RW, standing rest to check sats and monitor ofr fatigue   Stairs             Wheelchair Mobility     Tilt Bed    Modified Rankin (Stroke Patients Only)       Balance Overall balance assessment: Needs assistance Sitting-balance support: Feet supported Sitting balance-Leahy Scale: Good     Standing balance support: During functional activity, Reliant on assistive device for balance Standing balance-Leahy Scale: Poor Standing balance comment: reliant on RW support                            Cognition Arousal/Alertness: Awake/alert Behavior During Therapy: WFL for tasks assessed/performed Overall Cognitive Status: Within Functional Limits for tasks assessed                                          Exercises      General Comments        Pertinent Vitals/Pain Pain Assessment Pain Assessment: No/denies pain    Home Living                          Prior Function  PT Goals (current goals can now be found in the care plan section) Acute Rehab PT Goals Patient Stated Goal: Get better and be home soon PT Goal Formulation: With patient Time For Goal Achievement: 08/05/22 Potential to Achieve Goals: Good Progress towards PT goals: Progressing toward goals    Frequency    Min 3X/week      PT Plan Current plan remains appropriate    Co-evaluation              AM-PAC PT "6 Clicks" Mobility   Outcome Measure  Help needed turning from your back to your side while in a flat bed without using bedrails?: None Help needed moving from lying on your back to sitting on the side of a flat bed without using bedrails?: None Help needed moving to and from a  bed to a chair (including a wheelchair)?: None Help needed standing up from a chair using your arms (e.g., wheelchair or bedside chair)?: None Help needed to walk in hospital room?: A Little Help needed climbing 3-5 steps with a railing? : A Little 6 Click Score: 22    End of Session Equipment Utilized During Treatment: Oxygen Activity Tolerance: Patient tolerated treatment well Patient left: with call bell/phone within reach;in bed;with bed alarm set Nurse Communication: Mobility status PT Visit Diagnosis: Other abnormalities of gait and mobility (R26.89);Muscle weakness (generalized) (M62.81);Other (comment)     Time: 6213-0865 PT Time Calculation (min) (ACUTE ONLY): 23 min  Charges:    $Therapeutic Activity: 8-22 mins PT General Charges $$ ACUTE PT VISIT: 1 Visit                     Marye Round, PT DPT Acute Rehabilitation Services Secure Chat Preferred  Office [phone]    Altariq Goodall Sheliah Plane 07/24/2022, 3:18 PM

## 2022-07-24 NOTE — Discharge Summary (Signed)
Physician Discharge Summary   Patient: Felicia Benson MRN: [id number] DOB: 24-May-1930  Admit date:     07/21/2022  Discharge date: 07/24/22  Discharge Physician: [name]   PCP: [name], MD   Recommendations at discharge:  Repeat basic metabolic panel to follow ultralights renal function Reassess blood pressure and adjust antihypertensive treatment as needed Follow and normal lesions appreciated on pancreas and adrenal glands on performed CT scan.  Discharge Diagnoses: Principal Problem:   Acute on chronic hypoxic respiratory failure (HCC) Active Problems:   COPD with acute exacerbation (HCC)   Acute on chronic diastolic CHF (congestive heart failure) (HCC)   Essential hypertension   Crohn's disease (HCC)   Normocytic anemia   CAD S/P percutaneous coronary angioplasty   History of cardiac pacemaker   TIA (transient ischemic attack)   CKD stage 3a, GFR 45-59 ml/min (HCC)   Thoracic aortic aneurysm (HCC)   GERD (gastroesophageal reflux disease)   Pancreatic lesion   Macular degeneration   Hypocalcemia   Insomnia  Brief hospital admission narrative course: As per H&P written by Dr. Dorna Mai on 07/21/22 89 year old female with a past medical history of hypertension, diastolic heart failure, obstructive pulmonary disease, coronary artery disease status post RCA stent, chronic kidney disease, thoracic aortic aneurysm, Crohn's disease who presented to the emergency department with worsening shortness of breath of note the patient is on 3 L of home oxygen with a concentrator and has noted desaturation down to the 70s at home when walking to the bathroom she increased her home concentrator and eventually called EMS today as her shortness of breath worsened. 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 She did have an upper respiratory tract infection 6 weeks ago and a COPD exacerbation with sputum changes at that time. She typically walks 25 feet at home and lives with her daughter. Reportedly the patient had significant wheeze when EMS saw her quiring prolonged nebulization. The patient also notes her pacemaker was recently replaced.   Assessment and Plan: * Acute on chronic hypoxic respiratory failure (HCC) Acute on chronic hypoxic respiratory failure -Patient reports using 3.5 L nasal cannula supplementation at baseline; stabilized and back to baseline at time of discharge. -Appears to be multifactorial in the setting of CHF and COPD exacerbation -Continue outpatient follow-up and complete treatment for CHF and COPD exacerbation as mentioned above.  Acute on chronic diastolic CHF (congestive heart failure) (HCC) -Acute on chronic diastolic heart failure -Brain natretic peptide 194.6, troponin were negative -Patient denies chest pain -Previous echo demonstrating grade 1 diastolic dysfunction with preserved ejection fraction (in 2022); updated echo pending at the moment. -Continue to follow daily weights, adequate hydration and low-sodium diet -Excellent response to IV diuresis -At discharge Lasix dose adjusted to 60 mg daily -Continue close follow-up of volume status with further adjustment as required -Repeat basic metabolic panel to follow ultralights renal function.  COPD with acute exacerbation (HCC) -Chronic obstructive pulmonary disorder, mild acute exacerbation appreciated -Continue bronchodilator management, prednisone, flutter valve and wean oxygen supplementation as tolerated; at discharge back to 3.4 L [place] supplementation. -No  elevated WBCs, no fever, no productive  coughing spells; holding on antibiotics at the moment.  Insomnia -Patient reports the use of as needed Tylenol PM at home -Continue melatonin at discharge -Good sleep hygiene discussed with patient.   Hypocalcemia -Continue oral supplementation.  Macular degeneration -Macular degeneration -At baseline patient lives with daughter reports some difficulties at home limiting her ADLs. -Seen by physical therapy with recommendation for home health PT/OT at discharge.  Pancreatic lesion -Pancreatic and adrenal lesions, Noted on the CT study on 07/19/2022 -Outpatient follow-up/workup to be pursuit   GERD (gastroesophageal reflux disease) -Continue PPI twice a day -Lifestyle changes discussed with patient.   Thoracic aortic aneurysm [facility]) -Patient had a CT pulmonary angiogram which showed unchanged ascending thoracic aortic aneurysm on 07/19/2022 without any acute event -Continue outpatient follow-up  CKD stage 3a, GFR 45-59 ml/min (HCC) -Chronic kidney disease stage IIIa -Appears to be stable and currently close to her baseline -Continue to follow renal function trend, follow low-sodium diet and maintain adequate hydration. -Minimize the use of nephrotoxic agents and avoid hypotension.  TIA (transient ischemic attack) -No acute neurologic symptoms -Continue the use of aspirin and statin.    History of cardiac pacemaker -Pacemaker, recently replaced -Good capture on the patient's ECG -Incision looks reassuring -Continue patient follow-up with cardiology service.  CAD S/P percutaneous coronary angioplasty -Coronary artery disease status post RCA stent -Negative troponin -Patient reports no chest pain -Continue risk factor modification; continue treatment with aspirin and beta-blocker.    Normocytic anemia -Appears to be anemia of chronic disease -No overt bleeding appreciated -Continue to follow hemoglobin trend/stability.  Crohn's disease (HCC) -No current concerns  continue the patient's mesalamine 1500 mg twice daily -Continue outpatient follow-up with GI service.  Essential hypertension -Appears to be stable  and at baseline  -Continue current antihypertensive agents and follow vital signs. -Heart healthy diet discussed with patient.     Consultants: None Procedures performed: See below for x-ray reports. Disposition: Home with home health services. Diet recommendation: Heart healthy/low-sodium diet.  DISCHARGE MEDICATION: Allergies as of 07/24/2022       Reactions   Ibuprofen    Other reaction(s): gi bleed   Ace Inhibitors    Other reaction(s): cough   Nsaids Nausea And Vomiting   Pt has preference to tylonel        Medication List     TAKE these medications    acetaminophen 500 MG tablet Commonly known as: TYLENOL Take 1,000 mg by mouth daily.   albuterol 108 (90 Base) MCG/ACT inhaler Commonly known as: VENTOLIN HFA Inhale 2 puffs into the lungs every 6 (six) hours as needed for wheezing or shortness of breath.   amLODipine 10 MG tablet Commonly known as: NORVASC Take 1 tablet (10 mg total) by mouth every evening. Hold for next few days   aspirin EC 81 MG tablet Take 1 tablet (81 mg total) by mouth daily. Swallow whole.   atorvastatin 40 MG tablet Commonly known as: LIPITOR Take 40 mg by mouth every evening.   diphenhydramine-acetaminophen 25-500 MG Tabs tablet Commonly known as: TYLENOL PM Take 1 tablet by mouth at bedtime.   docusate sodium 100 MG capsule Commonly known as: COLACE Take 2 capsules (200 mg total) by mouth 2 (two) times daily. What changed: when to take this   esomeprazole 40 MG capsule Commonly known as: NEXIUM Take 40 mg by mouth 2 (two) times daily before a meal.   furosemide 40 MG tablet Commonly known as: LASIX Take 1.5  tablets (60 mg total) by mouth daily. What changed: how much to take   hydroxypropyl methylcellulose / hypromellose 2.5 % ophthalmic solution Commonly known as:  ISOPTO TEARS / GONIOVISC Place 1 drop into both eyes 2 (two) times daily as needed for dry eyes.   losartan 25 MG tablet Commonly known as: COZAAR Take 0.5 tablets (12.5 mg total) by mouth daily. What changed:  how much to take Another medication with the same name was removed. Continue taking this medication, and follow the directions you see here.   melatonin 3 MG Tabs tablet Take 1 tablet (3 mg total) by mouth at bedtime.   mesalamine 500 MG CR capsule Commonly known as: PENTASA Take 1,000 mg by mouth 2 (two) times daily.   Metoprolol Tartrate 75 MG Tabs Take 1 tablet by mouth 2 (two) times daily.   mometasone-formoterol 200-5 MCG/ACT Aero Commonly known as: DULERA Inhale 2 puffs into the lungs 2 (two) times daily.   montelukast 10 MG tablet Commonly known as: SINGULAIR Take 1 tablet (10 mg total) by mouth daily.   multivitamin-lutein Caps capsule Take 1 capsule by mouth daily.   Nasal Moist Gel Place 1 application  into the nose 2 (two) times daily.   polyethylene glycol 17 g packet Commonly known as: MIRALAX / GLYCOLAX Take 17 g by mouth daily as needed.   potassium chloride 20 MEQ packet Commonly known as: KLOR-CON Take 20 mEq by mouth daily.   predniSONE 20 MG tablet Commonly known as: DELTASONE Take 2 tablets by mouth daily x 1 day; then 1 tablet by mouth daily x 1 day; then half tablet by mouth daily x 3 days and stop prednisone.   Vitamin D3 125 MCG (5000 UT) Tabs Take 5,000 Units by mouth daily.        Follow-up Information     [name], MD. Schedule an appointment as soon as possible for a visit in 10 day(s).   Specialty: Internal Medicine Contact information: [address] [place] [address] [phone]                Discharge Exam: Ceasar Mons Weights   07/21/22 1438 07/23/22 0400 07/24/22 0515  Weight: 85.3 kg 91.4 kg 89.9 kg   General exam: Alert, awake, oriented x 3; no chest pain, no fever, no nausea, no  vomiting; reports feeling back to baseline and feeling ready for discharge. Respiratory system: No wheezing, no frank crackles; no using accessory muscle.  Good saturation on 3.5 L nasal cannula supplementation. Cardiovascular system: Rate controlled, no rubs, no gallops, no JVD on exam. Gastrointestinal system: Abdomen is nondistended, soft and nontender. No organomegaly or masses felt. Normal bowel sounds heard. Central nervous system: Alert and oriented. No focal neurological deficits. Extremities: No cyanosis clubbing; no edema appreciated at discharge. Skin: No petechiae. Psychiatry: Judgement and insight appear normal. Mood & affect appropriate.   Condition at discharge: Stable and improved.  The results of significant diagnostics from this hospitalization (including imaging, microbiology, ancillary and laboratory) are listed below for reference.   Imaging Studies: ECHOCARDIOGRAM COMPLETE  Result Date: 07/23/2022    ECHOCARDIOGRAM REPORT   Patient Name:   Felicia Benson Date of Exam: 07/23/2022 Medical Rec #:  [id number]        Height:       66.0 in Accession #:    [id number]       Weight:       201.5 lb Date of Birth:  1930-04-09  BSA:          2.006 m Patient Age:    89 years         BP:           130/63 mmHg Patient Gender: F                HR:           63 bpm. Exam Location:  Inpatient Procedure: 2D Echo, Cardiac Doppler and Color Doppler Indications:    CHF I50.9  History:        Patient has prior history of Echocardiogram examinations, most                 recent 12/31/2020. CHF, CAD, TIA; Risk Factors:Hypertension and                 Dyslipidemia. CKD, stage 3.  Sonographer:    Lucendia Herrlich Referring Phys: [id number] ANDREW C CORE IMPRESSIONS  1. Left ventricular ejection fraction, by estimation, is 60 to 65%. The left ventricle has normal function. The left ventricle has no regional wall motion abnormalities. There is mild asymmetric left ventricular hypertrophy of the  basal-septal segment. Left ventricular diastolic parameters are consistent with Grade I diastolic dysfunction (impaired relaxation).  2. Right ventricular systolic function is normal. The right ventricular size is normal. There is normal pulmonary artery systolic pressure. The estimated right ventricular systolic pressure is 31.7 mmHg.  3. Left atrial size was mildly dilated.  4. Right atrial size was mildly dilated.  5. The mitral valve is normal in structure. No evidence of mitral valve regurgitation. No evidence of mitral stenosis.  6. The aortic valve is tricuspid. There is mild calcification of the aortic valve. Aortic valve regurgitation is trivial. Aortic valve sclerosis/calcification is present, without any evidence of aortic stenosis.  7. Aortic dilatation noted. There is mild dilatation of the ascending aorta, measuring 42 mm.  8. The inferior vena cava is normal in size with greater than 50% respiratory variability, suggesting right atrial pressure of 3 mmHg. FINDINGS  Left Ventricle: Left ventricular ejection fraction, by estimation, is 60 to 65%. The left ventricle has normal function. The left ventricle has no regional wall motion abnormalities. The left ventricular internal cavity size was normal in size. There is  mild asymmetric left ventricular hypertrophy of the basal-septal segment. Left ventricular diastolic parameters are consistent with Grade I diastolic dysfunction (impaired relaxation). Right Ventricle: The right ventricular size is normal. No increase in right ventricular wall thickness. Right ventricular systolic function is normal. There is normal pulmonary artery systolic pressure. The tricuspid regurgitant velocity is 2.68 m/s, and  with an assumed right atrial pressure of 3 mmHg, the estimated right ventricular systolic pressure is 31.7 mmHg. Left Atrium: Left atrial size was mildly dilated. Right Atrium: Right atrial size was mildly dilated. Pericardium: There is no evidence of  pericardial effusion. Mitral Valve: The mitral valve is normal in structure. Mild mitral annular calcification. No evidence of mitral valve regurgitation. No evidence of mitral valve stenosis. MV peak gradient, 5.4 mmHg. The mean mitral valve gradient is 2.0 mmHg. Tricuspid Valve: The tricuspid valve is normal in structure. Tricuspid valve regurgitation is mild . No evidence of tricuspid stenosis. Aortic Valve: The aortic valve is tricuspid. There is mild calcification of the aortic valve. Aortic valve regurgitation is trivial. Aortic valve sclerosis/calcification is present, without any evidence of aortic stenosis. Aortic valve mean gradient measures 6.0 mmHg. Aortic valve peak gradient measures 13.1 mmHg. Aortic  valve area, by VTI measures 2.55 cm. Pulmonic Valve: The pulmonic valve was normal in structure. Pulmonic valve regurgitation is mild to moderate. No evidence of pulmonic stenosis. Aorta: Aortic dilatation noted. There is mild dilatation of the ascending aorta, measuring 42 mm. Venous: The inferior vena cava is normal in size with greater than 50% respiratory variability, suggesting right atrial pressure of 3 mmHg. IAS/Shunts: No atrial level shunt detected by color flow Doppler. Additional Comments: A device lead is visualized.  LEFT VENTRICLE PLAX 2D LVIDd:         4.30 cm   Diastology LVIDs:         3.00 cm   LV e' medial:    5.55 cm/s LV PW:         1.00 cm   LV E/e' medial:  11.5 LV IVS:        1.40 cm   LV e' lateral:   11.11 cm/s LVOT diam:     2.00 cm   LV E/e' lateral: 5.8 LV SV:         89 LV SV Index:   44 LVOT Area:     3.14 cm  RIGHT VENTRICLE             IVC RV S prime:     13.60 cm/s  IVC diam: 1.40 cm TAPSE (M-mode): 1.5 cm LEFT ATRIUM             Index        RIGHT ATRIUM           Index LA diam:        3.50 cm 1.74 cm/m   RA Area:     22.60 cm LA Vol (A2C):   41.0 ml 20.44 ml/m  RA Volume:   64.90 ml  32.35 ml/m LA Vol (A4C):   48.5 ml 24.18 ml/m LA Biplane Vol: 48.0 ml 23.93 ml/m   AORTIC VALVE                     PULMONIC VALVE AV Area (Vmax):    2.45 cm      PR End Diast Vel: 12.25 msec AV Area (Vmean):   2.46 cm AV Area (VTI):     2.55 cm AV Vmax:           181.00 cm/s AV Vmean:          114.000 cm/s AV VTI:            0.348 m AV Peak Grad:      13.1 mmHg AV Mean Grad:      6.0 mmHg LVOT Vmax:         141.33 cm/s LVOT Vmean:        89.233 cm/s LVOT VTI:          0.282 m LVOT/AV VTI ratio: 0.81  AORTA Ao Root diam: 3.60 cm Ao Asc diam:  4.40 cm MITRAL VALVE                TRICUSPID VALVE MV Area (PHT): 2.66 cm     TR Peak grad:   28.7 mmHg MV Area VTI:   2.91 cm     TR Vmax:        268.00 cm/s MV Peak grad:  5.4 mmHg MV Mean grad:  2.0 mmHg     SHUNTS MV Vmax:       1.16 m/s     Systemic VTI:  0.28 m MV Vmean:  60.1 cm/s    Systemic Diam: 2.00 cm MV Decel Time: 285 msec MV E velocity: 63.90 cm/s MV A velocity: 100.00 cm/s MV E/A ratio:  0.64 Arvilla Meres MD Electronically signed by Arvilla Meres MD Signature Date/Time: 07/23/2022/3:43:25 PM    Final    DG Chest Portable 1 View  Result Date: 07/21/2022 CLINICAL DATA:  Dyspnea. EXAM: PORTABLE CHEST 1 VIEW COMPARISON:  October 25, 2021 FINDINGS: Normal cardiac silhouette. Tortuosity and calcific atherosclerotic disease of the aorta. Hazy opacities throughout both lungs. Bilateral moderate in size pleural effusions. Postsurgical changes at the thoracic inlet. IMPRESSION: 1. Hazy opacities throughout both lungs may represent pulmonary edema. 2. Bilateral moderate in size pleural effusions. Electronically Signed   By: Ted Mcalpine M.D.   On: 07/21/2022 15:29   CT Angio Chest W/Cm &/Or Wo Cm  Result Date: 07/19/2022 CLINICAL DATA:  Ascending thoracic aortic aneurysm. EXAM: CT ANGIOGRAPHY CHEST WITH CONTRAST TECHNIQUE: Multidetector CT imaging of the chest was performed using the standard protocol during bolus administration of intravenous contrast. Multiplanar CT image reconstructions and MIPs were obtained to  evaluate the vascular anatomy. RADIATION DOSE REDUCTION: This exam was performed according to the departmental dose-optimization program which includes automated exposure control, adjustment of the mA and/or kV according to patient size and/or use of iterative reconstruction technique. CONTRAST:  75mL ISOVUE-370 IOPAMIDOL (ISOVUE-370) INJECTION 76% COMPARISON:  CTA chest 07/12/2021. FINDINGS: Cardiovascular: The ascending thoracic aortic aneurysm measuring up to 4.4 cm in maximum dimension is unchanged when measured again using similar technique. There is no evidence of dissection. There is mild calcified plaque throughout the thoracic aorta. There is no evidence of pulmonary embolism. The heart size is normal. Mild coronary artery calcifications are again seen. There is no pericardial effusion. The left chest wall cardiac device and associated leads are stable. Mediastinum/Nodes: The right thyroid lobe is enlarged and heterogeneous, unchanged going back to at least 2022. The esophagus is grossly unremarkable. There is no mediastinal, hilar, or axillary lymphadenopathy. Lungs/Pleura: The trachea and central airways are patent. There are small to moderate-sized right and small left pleural effusions with adjacent atelectasis. There is mild interlobular septal thickening. There is no other focal consolidation. There are no new, enlarging, or suspicious nodules. Upper Abdomen: A hypodense lesion in the distal pancreatic body is partially imaged but appears unchanged. A left adrenal nodule is unchanged. One year stability favors a benign adenoma. Musculoskeletal: There is no acute osseous abnormality or suspicious osseous lesion. Review of the MIP images confirms the above findings. IMPRESSION: 1. Unchanged 4.4 cm ascending thoracic aortic aneurysm. Recommend continued annual follow-up. 2. Small to moderate-sized right and small left pleural effusions with adjacent atelectasis, and mild interlobular septal thickening  consistent with mild pulmonary interstitial edema. Findings may reflect CHF. 3. Hypodense lesion in the distal pancreatic body is partially imaged but appears unchanged, favored benign. A left adrenal nodule is also unchanged, likely a benign adenoma. Recommend continued attention on any follow-up imaging of the abdomen/pelvis. Electronically Signed   By: Lesia Hausen M.D.   On: 07/19/2022 11:56    Microbiology: Results for orders placed or performed during the hospital encounter of 12/30/20  Resp Panel by RT-PCR (Flu A&B, Covid) Nasopharyngeal Swab     Status: None   Collection Time: 12/31/20 12:49 AM   Specimen: Nasopharyngeal Swab; Nasopharyngeal(NP) swabs in vial transport medium  Result Value Ref Range Status   SARS Coronavirus 2 by RT PCR NEGATIVE NEGATIVE Final    Comment: (NOTE) SARS-CoV-2 target nucleic  acids are NOT DETECTED.  The SARS-CoV-2 RNA is generally detectable in upper respiratory specimens during the acute phase of infection. The lowest concentration of SARS-CoV-2 viral copies this assay can detect is 138 copies/mL. A negative result does not preclude SARS-Cov-2 infection and should not be used as the sole basis for treatment or other patient management decisions. A negative result may occur with  improper specimen collection/handling, submission of specimen other than nasopharyngeal swab, presence of viral mutation(s) within the areas targeted by this assay, and inadequate number of viral copies(<138 copies/mL). A negative result must be combined with clinical observations, patient history, and epidemiological information. The expected result is Negative.  Fact Sheet for Patients:  BloggerCourse.com  Fact Sheet for Healthcare Providers:  SeriousBroker.it  This test is no t yet approved or cleared by the [place] FDA and  has been authorized for detection and/or diagnosis of SARS-CoV-2 by FDA under an Emergency  Use Authorization (EUA). This EUA will remain  in effect (meaning this test can be used) for the duration of the COVID-19 declaration under Section 564(b)(1) of the Act, 21 U.S.C.section 360bbb-3(b)(1), unless the authorization is terminated  or revoked sooner.       Influenza A by PCR NEGATIVE NEGATIVE Final   Influenza B by PCR NEGATIVE NEGATIVE Final    Comment: (NOTE) The Xpert Xpress SARS-CoV-2/FLU/RSV plus assay is intended as an aid in the diagnosis of influenza from Nasopharyngeal swab specimens and should not be used as a sole basis for treatment. Nasal washings and aspirates are unacceptable for Xpert Xpress SARS-CoV-2/FLU/RSV testing.  Fact Sheet for Patients: BloggerCourse.com  Fact Sheet for Healthcare Providers: SeriousBroker.it  This test is not yet approved or cleared by the [place] FDA and has been authorized for detection and/or diagnosis of SARS-CoV-2 by FDA under an Emergency Use Authorization (EUA). This EUA will remain in effect (meaning this test can be used) for the duration of the COVID-19 declaration under Section 564(b)(1) of the Act, 21 U.S.C. section 360bbb-3(b)(1), unless the authorization is terminated or revoked.  Performed at [facility], [address] [place], [place] [address]     Labs: CBC: Recent Labs  Lab 07/21/22 1516 07/22/22 0039  WBC 9.1 7.0  NEUTROABS 5.8  --   HGB 11.4* 9.8*  HCT 36.9 31.6*  MCV 96.6 93.5  PLT 314 274   Basic Metabolic Panel: Recent Labs  Lab 07/21/22 1516 07/22/22 0039 07/23/22 0045 07/24/22 0037  NA 136 137 134* 133*  K 3.6 3.6 4.5 4.1  CL 98 97* 96* 93*  CO2 28 24 27  33*  GLUCOSE 125* 207* 141* 126*  BUN 12 13 17  29*  CREATININE 1.01* 1.37* 1.26* 1.48*  CALCIUM 8.1* 8.0* 8.0* 8.1*  MG  --  1.4* 1.9  --    Liver Function Tests: Recent Labs  Lab 07/21/22 1516 07/22/22 0039  AST 23 22  ALT 14 12  ALKPHOS 73 64  BILITOT  0.7 0.5  PROT 6.6 5.8*  ALBUMIN 2.7* 2.5*   CBG: No results for input(s): "GLUCAP" in the last 168 hours.  Discharge time spent: greater than 30 minutes.  Signed: [name], MD [facility] 07/24/2022

## 2022-07-24 NOTE — Progress Notes (Signed)
   07/24/22 1051  Mobility  Activity Ambulated with assistance in hallway  Level of Assistance Minimal assist, patient does 75% or more  Assistive Device Front wheel walker  Distance Ambulated (ft) 250 ft  Activity Response Tolerated well  Mobility Referral Yes  $Mobility charge 1 Mobility  Mobility Specialist Start Time (ACUTE ONLY) 1030  Mobility Specialist Stop Time (ACUTE ONLY) 1045  Mobility Specialist Time Calculation (min) (ACUTE ONLY) 15 min   Mobility Specialist: Progress Note   Post-Mobility: HR 95, SPO2 93, 4L  Pt agreed to mobility session. Required MinA from bed to stand using RW. No c/o. After session, pt returned to bed with all needs met.   [name] Mobility Specialist Please contact via SecureChat or Rehab office at [phone]

## 2022-07-24 NOTE — Progress Notes (Signed)
RT attempted x2 to give scheduled am BID neb treatments. Pt with therapy and again later was asleep. Neb treatments will be given at scheduled time at 2000 7/16.

## 2022-07-24 NOTE — TOC Transition Note (Signed)
Transition of Care [facility]) - CM/SW Discharge Note   Patient Details  Name: PATTY LEITZKE MRN: [id number] Date of Birth: 1930-08-11  Transition of Care [facility]) CM/SW Contact:  Leone Haven, RN Phone Number: 07/24/2022, 2:46 PM   Clinical Narrative:    Patient is for dc today,  her daughter will transport her home.    Final next level of care: Home w Home Health Services Barriers to Discharge: No Barriers Identified   Patient Goals and CMS Choice CMS Medicare.gov Compare Post Acute Care list provided to:: Patient Choice offered to / list presented to : Patient  Discharge Placement                         Discharge Plan and Services Additional resources added to the After Visit Summary for     Discharge Planning Services: CM Consult Post Acute Care Choice: Home Health          DME Arranged: N/A DME Agency: NA       HH Arranged: RN, PT, OT HH Agency: [facility] Date HH Agency Contacted: 07/24/22 Time HH Agency Contacted: [id number] Representative spoke with at [facility] Agency: Morrie Sheldon  Social Determinants of Health (SDOH) Interventions SDOH Screenings   Tobacco Use: Medium Risk (07/21/2022)     Readmission Risk Interventions     No data to display

## 2022-08-06 ENCOUNTER — Ambulatory Visit (HOSPITAL_BASED_OUTPATIENT_CLINIC_OR_DEPARTMENT_OTHER): Payer: Medicare Other | Admitting: Nurse Practitioner

## 2022-08-06 ENCOUNTER — Encounter (HOSPITAL_BASED_OUTPATIENT_CLINIC_OR_DEPARTMENT_OTHER): Payer: Self-pay | Admitting: Nurse Practitioner

## 2022-08-06 ENCOUNTER — Ambulatory Visit
Admission: RE | Admit: 2022-08-06 | Discharge: 2022-08-06 | Disposition: A | Discharge status: Home / Self Care | Payer: Medicare Other | Source: Ambulatory Visit | Attending: Internal Medicine | Admitting: Internal Medicine

## 2022-08-06 VITALS — BP 112/60 | HR 71 | Resp 11 | Ht 66.0 in | Wt 188.0 lb

## 2022-08-06 DIAGNOSIS — G4733 Obstructive sleep apnea (adult) (pediatric): Secondary | ICD-10-CM

## 2022-08-06 DIAGNOSIS — J449 Chronic obstructive pulmonary disease, unspecified: Secondary | ICD-10-CM

## 2022-08-06 DIAGNOSIS — J310 Chronic rhinitis: Secondary | ICD-10-CM | POA: Diagnosis not present

## 2022-08-06 DIAGNOSIS — Z1231 Encounter for screening mammogram for malignant neoplasm of breast: Secondary | ICD-10-CM

## 2022-08-06 DIAGNOSIS — J9611 Chronic respiratory failure with hypoxia: Secondary | ICD-10-CM

## 2022-08-06 DIAGNOSIS — I5033 Acute on chronic diastolic (congestive) heart failure: Secondary | ICD-10-CM | POA: Diagnosis not present

## 2022-08-06 MED ORDER — FLUTICASONE PROPIONATE 50 MCG/ACT NA SUSP: 2.0000 | Freq: Every day | NASAL | 2 refills | Status: DC

## 2022-08-06 MED ORDER — BREZTRI AEROSPHERE 160-9-4.8 MCG/ACT IN AERO: 2.0000 | INHALATION_SPRAY | Freq: Two times a day (BID) | RESPIRATORY_TRACT | Status: DC

## 2022-08-06 MED ORDER — BREZTRI AEROSPHERE 160-9-4.8 MCG/ACT IN AERO
2.0000 | INHALATION_SPRAY | Freq: Two times a day (BID) | RESPIRATORY_TRACT | 12 refills | Status: DC
Start: 2022-08-06 — End: 2022-10-29

## 2022-08-06 NOTE — Assessment & Plan Note (Addendum)
Recent hospitalization for acute CHF exacerbation/AECOPD. Clinically improved. Advised that she needs to remain on maintenance therapies. Will trial step up to [facility] and provide with samples. She will monitor her BP at home and notify of any changes as she previously had higher readings with the [facility]. Encouraged to remain active. Action plan in place.

## 2022-08-06 NOTE — Assessment & Plan Note (Addendum)
Clinically improved. Goal >88-90%. Will obtain ONO on CPAP/O2 once she resumes therapy.   Patient Instructions  Continue Albuterol inhaler 2 puffs every 6 hours as needed for shortness of breath or wheezing. Notify if symptoms persist despite rescue inhaler/neb use.  Stop Dulera. Start Breztri 2 puffs Twice daily. Brush tongue and rinse mouth afterwards Continue singulair 1 tab At bedtime Continue supplemental oxygen 3-5 lpm for goal >88-90%  Restart CPAP nightly, minimum of 4-6 hours a night.  Maintain clean equipment Be aware of reduced alertness and do not drive or operate heavy machinery if experiencing this or drowsiness.   Saline nasal rinse (such as Netti Pot or [employer]) 1-2 times a day for sinus congestion. Use bottled, distilled water Flonase nasal spray 2 sprays each nostril 20-30 minutes after saline rinse for sinus congestion   Monitor weights at home and notify your heart doctor if you gain 2-3 pounds overnight or 5 lb in a week  Follow up with your primary care doctor regarding the lesions on your pancreas and adrenal glands to determine timing of next scan  Repeat chest x ray at follow up  Follow up in 6 weeks with Dr. Wynona Neat or Philis Nettle. If symptoms do not improve or worsen, please contact office for sooner follow up or seek emergency care.

## 2022-08-06 NOTE — Assessment & Plan Note (Signed)
Clinically improved. Back to dry weight per her report. Follow up with cardiology as scheduled. Repeat CXR to ensure effusions resolved on f/u.

## 2022-08-06 NOTE — Addendum Note (Signed)
Addended by: Winn Jock on: 08/06/2022 11:24 AM   Modules accepted: Orders

## 2022-08-06 NOTE — Assessment & Plan Note (Signed)
Suboptimal compliance. Reviewed risks of untreated OSA. She is not currently driving. Encouraged to restart therapy. She was agreeable to this. Will assess compliance at follow up and obtain ONO to ensure hypoxia is corrected with CPAP/4 lpm supplemental O2.

## 2022-08-06 NOTE — Assessment & Plan Note (Signed)
Add on saline rinses and intranasal steroid.

## 2022-08-06 NOTE — Progress Notes (Signed)
@Patient  ID: Felicia Benson, female    DOB: July 16, 1930, 89 y.o.   MRN: [id number]  Chief Complaint  Patient presents with   Follow-up    Follow up from the hospital for COPD  was told to increase o2  to 4 liter in the day and 5 at night. Pt is using liter  all the time     Referring provider: [name], MD  HPI: 89 year old female, former smoker followed for OSA, chronic respiratory failure and COPD. She is a patient of Dr. Trena Platt and last seen in office 05/11/2021. Past medical history significant for HTN, CAD s/p stent, CHF, AAA, TIA, GERD, pancreatic lesion, CKD, Crohn's disease, HLD, insomnia.   TEST/EVENTS:  07/19/2022 CTA chest PE protocol: No PE. Stable thoracic aneurysm. Atherosclerosis/CAD. Stable enlarged right thyroid lobe. Small to moderate sized right and small left effusion with atelectasis. Mild thickening. No focal consolidation. Hypodense lesion in pancreatic body, unchanged. Left adrenal nodule, unchanged.  07/21/2022 CXR: hazy opacities throughout both lungs, pulm edema. Bilateral moderate pleural effusions  05/11/2021: OV with Dr. Wynona Neat. Establishing care. Previously followed by pulmonologist in [place]. Uses Dulera as needed. Quit smoking 1985. Diagnosed with mild COPD. She does have OSA; has not been using CPAP due to waking up frequently at night. Uses lasix 20 mg in the morning. Advised she try disconnecting the tubing from the headgear when getting up to use the restroom. She's on supplemental oxygen 2 lpm. F/u 1 year.  08/06/2022: Today - follow up Patient presents today for overdue follow up. She was hospitalized recently from 07/21/2022-07/24/2022 for acute on chronic respiratory failure secondary to AECOPD and acute CHF exacerbation. Had a URI 6 weeks prior and associated COPD flare with sputum changes. Treated with IV diuretics and adjusted home lasix to 60 mg daily. She was discharged on prednisone. No abx administered.  Today, she presents with her  daughter. She tells me she is feeling much better. She's back down to her dry weight. She feels like her breathing is back to her baseline for the most part. She has an occasional non-productive cough in the evenings that usually subsides quickly. She hasn't noticed any wheezing. The swelling in her legs has mostly resolved. She denies fevers, chills, hemoptysis, orthopnea, PND, palpitations, CP. She is using her supplemental oxygen 4 lpm and 5 lpm at night. She is currently on [facility] but wants to know if she can try Breztri again. The [facility] is expensive. She has not had to use her rescue inhaler in over a week. She is not wearing her CPAP. She feels like it's too much attached to her so she gets frustrated with it, especially when she has to use the restroom at night. She is taking lasix 60 mg daily in the morning.  She is having some trouble with her ears feeling full, mostly the right one. She does have chronic sinus problem. Not using any nasal sprays. No headaches, sore throat, facial tenderness, ear pain or drainage, hearing changes.   Allergies  Allergen Reactions   Ibuprofen     Other reaction(s): gi bleed   Ace Inhibitors     Other reaction(s): cough   Nsaids Nausea And Vomiting    Pt has preference to tylonel    Immunization History  Administered Date(s) Administered   PFIZER(Purple Top)SARS-COV-2 Vaccination 01/22/2019, 02/12/2019    Past Medical History:  Diagnosis Date   AAA (abdominal aortic aneurysm) (HCC)    CHF (congestive heart failure) (HCC)  Crohn's disease (HCC)    Diverticulitis    Diverticulosis    GERD (gastroesophageal reflux disease)    GI bleed    Hypertension    OSA (obstructive sleep apnea)    Pacemaker    Thyroid nodule     Tobacco History: Social History   Tobacco Use  Smoking Status Former  Smokeless Tobacco Never   Counseling given: Not Answered   Outpatient Medications Prior to Visit  Medication Sig Dispense Refill   acetaminophen  (TYLENOL) 500 MG tablet Take 1,000 mg by mouth daily.     albuterol (VENTOLIN HFA) 108 (90 Base) MCG/ACT inhaler Inhale 2 puffs into the lungs every 6 (six) hours as needed for wheezing or shortness of breath. 8 g 2   amLODipine (NORVASC) 10 MG tablet Take 1 tablet (10 mg total) by mouth every evening.     aspirin EC 81 MG tablet Take 1 tablet (81 mg total) by mouth daily. Swallow whole. 30 tablet 11   atorvastatin (LIPITOR) 40 MG tablet Take 40 mg by mouth every evening.     Cholecalciferol (VITAMIN D3) 5000 units TABS Take 5,000 Units by mouth daily.      diphenhydramine-acetaminophen (TYLENOL PM) 25-500 MG TABS tablet Take 1 tablet by mouth at bedtime.     docusate sodium (COLACE) 100 MG capsule Take 2 capsules (200 mg total) by mouth 2 (two) times daily. 30 capsule 0   esomeprazole (NEXIUM) 40 MG capsule Take 40 mg by mouth 2 (two) times daily before a meal.     furosemide (LASIX) 40 MG tablet Take 1.5 tablets (60 mg total) by mouth daily. 45 tablet 2   hydroxypropyl methylcellulose / hypromellose (ISOPTO TEARS / GONIOVISC) 2.5 % ophthalmic solution Place 1 drop into both eyes 2 (two) times daily as needed for dry eyes. 15 mL 1   losartan (COZAAR) 25 MG tablet Take 0.5 tablets (12.5 mg total) by mouth daily. 30 tablet 1   melatonin 3 MG TABS tablet Take 1 tablet (3 mg total) by mouth at bedtime. 30 tablet 1   mesalamine (PENTASA) 500 MG CR capsule Take 1,000 mg by mouth 2 (two) times daily.     Metoprolol Tartrate 75 MG TABS Take 1 tablet by mouth 2 (two) times daily.     montelukast (SINGULAIR) 10 MG tablet Take 1 tablet (10 mg total) by mouth daily. 90 tablet 1   multivitamin-lutein (OCUVITE-LUTEIN) CAPS capsule Take 1 capsule by mouth daily.     polyethylene glycol (MIRALAX / GLYCOLAX) 17 g packet Take 17 g by mouth daily as needed. 28 each 0   potassium chloride (KLOR-CON) 20 MEQ packet Take 20 mEq by mouth daily. 30 each 0   predniSONE (DELTASONE) 20 MG tablet Take 2 tablets by mouth  daily x 1 day; then 1 tablet by mouth daily x 1 day; then half tablet by mouth daily x 3 days and stop prednisone. 5 tablet 0   Propyl Glycol-Hydroxyethylcell (NASAL MOIST) GEL Place 1 application  into the nose 2 (two) times daily.     mometasone-formoterol (DULERA) 200-5 MCG/ACT AERO Inhale 2 puffs into the lungs 2 (two) times daily. 1 each 2   No facility-administered medications prior to visit.     Review of Systems:   Constitutional: No weight loss or gain, night sweats, fevers, chills, or lassitude. +fatigue (baseline) HEENT: No headaches, difficulty swallowing, tooth/dental problems, or sore throat. No sneezing, itching, ear ache. +ear fullness, nasal congestion, post nasal drip CV:  +swelling in lower  extremities (improved). No chest pain, orthopnea, PND, anasarca, dizziness, palpitations, syncope Resp: +improved shortness of breath with exertion; occasional cough. No excess mucus or change in color of mucus.No hemoptysis. No wheezing.  No chest wall deformity GI:  No heartburn, indigestion GU: No dysuria, change in color of urine, urgency or frequency. MSK:  No joint pain or swelling Neuro: No dizziness or lightheadedness.  Psych: No depression or anxiety. Mood stable.     Physical Exam:  BP 112/60   Pulse 71   Resp 11   Ht 5\' 6"  (1.676 m)   Wt 188 lb (85.3 kg)   SpO2 91% Comment: 4 l  BMI 30.34 kg/m   GEN: Pleasant, interactive, chronically ill appearing; appears younger than stated age; obese; in no acute distress. HEENT:  Normocephalic and atraumatic. EACs patent bilaterally. TM pearly gray with present light reflex bilaterally. PERRLA. Sclera white. Nasal turbinates erythematous, moist and patent bilaterally. No rhinorrhea present. Oropharynx pink and moist, without exudate or edema. No lesions, ulcerations NECK:  Supple w/ fair ROM. No JVD present. Normal carotid impulses w/o bruits. Thyroid symmetrical with no goiter or nodules palpated. No lymphadenopathy.   CV:  RRR, no m/r/g, +1 b/l pedal edema. Pulses intact, +2 bilaterally. No cyanosis, pallor or clubbing. PULMONARY:  Unlabored, regular breathing. Clear bilaterally A&P w/o wheezes/rales/rhonchi. No accessory muscle use.  GI: BS present and normoactive. Soft, non-tender to palpation. No organomegaly or masses detected.  MSK: No erythema, warmth or tenderness. Cap refil <2 sec all extrem.  Neuro: A/Ox3. No focal deficits noted.   Skin: Warm, no lesions or rashe Psych: Normal affect and behavior. Judgement and thought content appropriate.     Lab Results:  CBC    Component Value Date/Time   WBC 7.0 07/22/2022 0039   RBC 3.38 (L) 07/22/2022 0039   HGB 9.8 (L) 07/22/2022 0039   HCT 31.6 (L) 07/22/2022 0039   PLT 274 07/22/2022 0039   MCV 93.5 07/22/2022 0039   MCH 29.0 07/22/2022 0039   MCHC 31.0 07/22/2022 0039   RDW 16.4 (H) 07/22/2022 0039   LYMPHSABS 2.3 07/21/2022 1516   MONOABS 0.9 07/21/2022 1516   EOSABS 0.1 07/21/2022 1516   BASOSABS 0.0 07/21/2022 1516    BMET    Component Value Date/Time   NA 133 (L) 07/24/2022 0037   K 4.1 07/24/2022 0037   CL 93 (L) 07/24/2022 0037   CO2 33 (H) 07/24/2022 0037   GLUCOSE 126 (H) 07/24/2022 0037   BUN 29 (H) 07/24/2022 0037   CREATININE 1.48 (H) 07/24/2022 0037   CALCIUM 8.1 (L) 07/24/2022 0037   GFRNONAA 33 (L) 07/24/2022 0037   GFRAA 44 (L) 05/21/2019 0709    BNP    Component Value Date/Time   BNP 194.6 (H) 07/21/2022 1903     Imaging:  ECHOCARDIOGRAM COMPLETE  Result Date: 07/23/2022    ECHOCARDIOGRAM REPORT   Patient Name:   DAYSHIA GOLDSBOROUGH Date of Exam: 07/23/2022 Medical Rec #:  [id number]        Height:       66.0 in Accession #:    [id number]       Weight:       201.5 lb Date of Birth:  1930-03-24         BSA:          2.006 m Patient Age:    89 years         BP:           130/63  mmHg Patient Gender: F                HR:           63 bpm. Exam Location:  Inpatient Procedure: 2D Echo, Cardiac Doppler and Color Doppler  Indications:    CHF I50.9  History:        Patient has prior history of Echocardiogram examinations, most                 recent 12/31/2020. CHF, CAD, TIA; Risk Factors:Hypertension and                 Dyslipidemia. CKD, stage 3.  Sonographer:    Lucendia Herrlich Referring Phys: [id number] ANDREW C CORE IMPRESSIONS  1. Left ventricular ejection fraction, by estimation, is 60 to 65%. The left ventricle has normal function. The left ventricle has no regional wall motion abnormalities. There is mild asymmetric left ventricular hypertrophy of the basal-septal segment. Left ventricular diastolic parameters are consistent with Grade I diastolic dysfunction (impaired relaxation).  2. Right ventricular systolic function is normal. The right ventricular size is normal. There is normal pulmonary artery systolic pressure. The estimated right ventricular systolic pressure is 31.7 mmHg.  3. Left atrial size was mildly dilated.  4. Right atrial size was mildly dilated.  5. The mitral valve is normal in structure. No evidence of mitral valve regurgitation. No evidence of mitral stenosis.  6. The aortic valve is tricuspid. There is mild calcification of the aortic valve. Aortic valve regurgitation is trivial. Aortic valve sclerosis/calcification is present, without any evidence of aortic stenosis.  7. Aortic dilatation noted. There is mild dilatation of the ascending aorta, measuring 42 mm.  8. The inferior vena cava is normal in size with greater than 50% respiratory variability, suggesting right atrial pressure of 3 mmHg. FINDINGS  Left Ventricle: Left ventricular ejection fraction, by estimation, is 60 to 65%. The left ventricle has normal function. The left ventricle has no regional wall motion abnormalities. The left ventricular internal cavity size was normal in size. There is  mild asymmetric left ventricular hypertrophy of the basal-septal segment. Left ventricular diastolic parameters are consistent with Grade I diastolic  dysfunction (impaired relaxation). Right Ventricle: The right ventricular size is normal. No increase in right ventricular wall thickness. Right ventricular systolic function is normal. There is normal pulmonary artery systolic pressure. The tricuspid regurgitant velocity is 2.68 m/s, and  with an assumed right atrial pressure of 3 mmHg, the estimated right ventricular systolic pressure is 31.7 mmHg. Left Atrium: Left atrial size was mildly dilated. Right Atrium: Right atrial size was mildly dilated. Pericardium: There is no evidence of pericardial effusion. Mitral Valve: The mitral valve is normal in structure. Mild mitral annular calcification. No evidence of mitral valve regurgitation. No evidence of mitral valve stenosis. MV peak gradient, 5.4 mmHg. The mean mitral valve gradient is 2.0 mmHg. Tricuspid Valve: The tricuspid valve is normal in structure. Tricuspid valve regurgitation is mild . No evidence of tricuspid stenosis. Aortic Valve: The aortic valve is tricuspid. There is mild calcification of the aortic valve. Aortic valve regurgitation is trivial. Aortic valve sclerosis/calcification is present, without any evidence of aortic stenosis. Aortic valve mean gradient measures 6.0 mmHg. Aortic valve peak gradient measures 13.1 mmHg. Aortic valve area, by VTI measures 2.55 cm. Pulmonic Valve: The pulmonic valve was normal in structure. Pulmonic valve regurgitation is mild to moderate. No evidence of pulmonic stenosis. Aorta: Aortic dilatation noted. There is mild dilatation of the ascending  aorta, measuring 42 mm. Venous: The inferior vena cava is normal in size with greater than 50% respiratory variability, suggesting right atrial pressure of 3 mmHg. IAS/Shunts: No atrial level shunt detected by color flow Doppler. Additional Comments: A device lead is visualized.  LEFT VENTRICLE PLAX 2D LVIDd:         4.30 cm   Diastology LVIDs:         3.00 cm   LV e' medial:    5.55 cm/s LV PW:         1.00 cm   LV E/e'  medial:  11.5 LV IVS:        1.40 cm   LV e' lateral:   11.11 cm/s LVOT diam:     2.00 cm   LV E/e' lateral: 5.8 LV SV:         89 LV SV Index:   44 LVOT Area:     3.14 cm  RIGHT VENTRICLE             IVC RV S prime:     13.60 cm/s  IVC diam: 1.40 cm TAPSE (M-mode): 1.5 cm LEFT ATRIUM             Index        RIGHT ATRIUM           Index LA diam:        3.50 cm 1.74 cm/m   RA Area:     22.60 cm LA Vol (A2C):   41.0 ml 20.44 ml/m  RA Volume:   64.90 ml  32.35 ml/m LA Vol (A4C):   48.5 ml 24.18 ml/m LA Biplane Vol: 48.0 ml 23.93 ml/m  AORTIC VALVE                     PULMONIC VALVE AV Area (Vmax):    2.45 cm      PR End Diast Vel: 12.25 msec AV Area (Vmean):   2.46 cm AV Area (VTI):     2.55 cm AV Vmax:           181.00 cm/s AV Vmean:          114.000 cm/s AV VTI:            0.348 m AV Peak Grad:      13.1 mmHg AV Mean Grad:      6.0 mmHg LVOT Vmax:         141.33 cm/s LVOT Vmean:        89.233 cm/s LVOT VTI:          0.282 m LVOT/AV VTI ratio: 0.81  AORTA Ao Root diam: 3.60 cm Ao Asc diam:  4.40 cm MITRAL VALVE                TRICUSPID VALVE MV Area (PHT): 2.66 cm     TR Peak grad:   28.7 mmHg MV Area VTI:   2.91 cm     TR Vmax:        268.00 cm/s MV Peak grad:  5.4 mmHg MV Mean grad:  2.0 mmHg     SHUNTS MV Vmax:       1.16 m/s     Systemic VTI:  0.28 m MV Vmean:      60.1 cm/s    Systemic Diam: 2.00 cm MV Decel Time: 285 msec MV E velocity: 63.90 cm/s MV A velocity: 100.00 cm/s MV E/A ratio:  0.64 Arvilla Meres MD Electronically signed by Reuel Boom  Bensimhon MD Signature Date/Time: 07/23/2022/3:43:25 PM    Final    DG Chest Portable 1 View  Result Date: 07/21/2022 CLINICAL DATA:  Dyspnea. EXAM: PORTABLE CHEST 1 VIEW COMPARISON:  October 25, 2021 FINDINGS: Normal cardiac silhouette. Tortuosity and calcific atherosclerotic disease of the aorta. Hazy opacities throughout both lungs. Bilateral moderate in size pleural effusions. Postsurgical changes at the thoracic inlet. IMPRESSION: 1. Hazy opacities  throughout both lungs may represent pulmonary edema. 2. Bilateral moderate in size pleural effusions. Electronically Signed   By: Ted Mcalpine M.D.   On: 07/21/2022 15:29   CT Angio Chest W/Cm &/Or Wo Cm  Result Date: 07/19/2022 CLINICAL DATA:  Ascending thoracic aortic aneurysm. EXAM: CT ANGIOGRAPHY CHEST WITH CONTRAST TECHNIQUE: Multidetector CT imaging of the chest was performed using the standard protocol during bolus administration of intravenous contrast. Multiplanar CT image reconstructions and MIPs were obtained to evaluate the vascular anatomy. RADIATION DOSE REDUCTION: This exam was performed according to the departmental dose-optimization program which includes automated exposure control, adjustment of the mA and/or kV according to patient size and/or use of iterative reconstruction technique. CONTRAST:  75mL ISOVUE-370 IOPAMIDOL (ISOVUE-370) INJECTION 76% COMPARISON:  CTA chest 07/12/2021. FINDINGS: Cardiovascular: The ascending thoracic aortic aneurysm measuring up to 4.4 cm in maximum dimension is unchanged when measured again using similar technique. There is no evidence of dissection. There is mild calcified plaque throughout the thoracic aorta. There is no evidence of pulmonary embolism. The heart size is normal. Mild coronary artery calcifications are again seen. There is no pericardial effusion. The left chest wall cardiac device and associated leads are stable. Mediastinum/Nodes: The right thyroid lobe is enlarged and heterogeneous, unchanged going back to at least 2022. The esophagus is grossly unremarkable. There is no mediastinal, hilar, or axillary lymphadenopathy. Lungs/Pleura: The trachea and central airways are patent. There are small to moderate-sized right and small left pleural effusions with adjacent atelectasis. There is mild interlobular septal thickening. There is no other focal consolidation. There are no new, enlarging, or suspicious nodules. Upper Abdomen: A hypodense  lesion in the distal pancreatic body is partially imaged but appears unchanged. A left adrenal nodule is unchanged. One year stability favors a benign adenoma. Musculoskeletal: There is no acute osseous abnormality or suspicious osseous lesion. Review of the MIP images confirms the above findings. IMPRESSION: 1. Unchanged 4.4 cm ascending thoracic aortic aneurysm. Recommend continued annual follow-up. 2. Small to moderate-sized right and small left pleural effusions with adjacent atelectasis, and mild interlobular septal thickening consistent with mild pulmonary interstitial edema. Findings may reflect CHF. 3. Hypodense lesion in the distal pancreatic body is partially imaged but appears unchanged, favored benign. A left adrenal nodule is also unchanged, likely a benign adenoma. Recommend continued attention on any follow-up imaging of the abdomen/pelvis. Electronically Signed   By: Lesia Hausen M.D.   On: 07/19/2022 11:56    Administration History     None           No data to display          No results found for: "NITRICOXIDE"      Assessment & Plan:   Chronic respiratory failure with hypoxia (HCC) Clinically improved. Goal >88-90%. Will obtain ONO on CPAP/O2 once she resumes therapy.   Patient Instructions  Continue Albuterol inhaler 2 puffs every 6 hours as needed for shortness of breath or wheezing. Notify if symptoms persist despite rescue inhaler/neb use.  Stop Dulera. Start Breztri 2 puffs Twice daily. Brush tongue and rinse mouth afterwards  Continue singulair 1 tab At bedtime Continue supplemental oxygen 3-5 lpm for goal >88-90%  Restart CPAP nightly, minimum of 4-6 hours a night.  Maintain clean equipment Be aware of reduced alertness and do not drive or operate heavy machinery if experiencing this or drowsiness.   Saline nasal rinse (such as Netti Pot or [employer]) 1-2 times a day for sinus congestion. Use bottled, distilled water Flonase nasal spray 2 sprays  each nostril 20-30 minutes after saline rinse for sinus congestion   Monitor weights at home and notify your heart doctor if you gain 2-3 pounds overnight or 5 lb in a week  Follow up with your primary care doctor regarding the lesions on your pancreas and adrenal glands to determine timing of next scan  Repeat chest x ray at follow up  Follow up in 6 weeks with Dr. Wynona Neat or Philis Nettle. If symptoms do not improve or worsen, please contact office for sooner follow up or seek emergency care.    COPD (chronic obstructive pulmonary disease) (HCC) Recent hospitalization for acute CHF exacerbation/AECOPD. Clinically improved. Advised that she needs to remain on maintenance therapies. Will trial step up to [facility] and provide with samples. She will monitor her BP at home and notify of any changes as she previously had higher readings with the [facility]. Encouraged to remain active. Action plan in place.  Acute on chronic diastolic CHF (congestive heart failure) (HCC) Clinically improved. Back to dry weight per her report. Follow up with cardiology as scheduled. Repeat CXR to ensure effusions resolved on f/u.   Chronic rhinitis Add on saline rinses and intranasal steroid.   OSA (obstructive sleep apnea) Suboptimal compliance. Reviewed risks of untreated OSA. She is not currently driving. Encouraged to restart therapy. She was agreeable to this. Will assess compliance at follow up and obtain ONO to ensure hypoxia is corrected with CPAP/4 lpm supplemental O2.   I spent 42 minutes of dedicated to the care of this patient on the date of this encounter to include pre-visit review of records, face-to-face time with the patient discussing conditions above, post visit ordering of testing, clinical documentation with the electronic health record, making appropriate referrals as documented, and communicating necessary findings to members of the patients care team.  Noemi Chapel, NP 08/06/2022  Pt  aware and understands NP's role.

## 2022-08-06 NOTE — Patient Instructions (Addendum)
Continue Albuterol inhaler 2 puffs every 6 hours as needed for shortness of breath or wheezing. Notify if symptoms persist despite rescue inhaler/neb use.  Stop Dulera. Start Breztri 2 puffs Twice daily. Brush tongue and rinse mouth afterwards Continue singulair 1 tab At bedtime Continue supplemental oxygen 3-5 lpm for goal >88-90%  Restart CPAP nightly, minimum of 4-6 hours a night.  Maintain clean equipment Be aware of reduced alertness and do not drive or operate heavy machinery if experiencing this or drowsiness.   Saline nasal rinse (such as Netti Pot or [employer]) 1-2 times a day for sinus congestion. Use bottled, distilled water Flonase nasal spray 2 sprays each nostril 20-30 minutes after saline rinse for sinus congestion   Monitor weights at home and notify your heart doctor if you gain 2-3 pounds overnight or 5 lb in a week  Follow up with your primary care doctor regarding the lesions on your pancreas and adrenal glands to determine timing of next scan  Repeat chest x ray at follow up  Follow up in 6 weeks with Dr. Wynona Neat or Philis Nettle. If symptoms do not improve or worsen, please contact office for sooner follow up or seek emergency care.

## 2022-08-17 ENCOUNTER — Ambulatory Visit: Payer: Medicare Other | Admitting: Thoracic Surgery (Cardiothoracic Vascular Surgery)

## 2022-08-20 ENCOUNTER — Ambulatory Visit: Payer: Medicare Other

## 2022-08-22 ENCOUNTER — Ambulatory Visit: Payer: Medicare Other | Admitting: Surgical

## 2022-08-22 ENCOUNTER — Encounter: Payer: Self-pay | Admitting: Surgical

## 2022-08-22 VITALS — BP 92/61 | HR 79 | Resp 20 | Ht 66.0 in | Wt 188.0 lb

## 2022-08-22 DIAGNOSIS — I7121 Aneurysm of the ascending aorta, without rupture: Secondary | ICD-10-CM

## 2022-08-22 NOTE — Progress Notes (Signed)
Subjective:     Patient ID: Felicia Benson, female    DOB: [birth year], 89 y.o.   MRN: [id number]  Chief Complaint  Patient presents with   Thoracic Aortic Aneurysm    1 year f/u with Chest CTA 07/19/22   Patient Active Problem List   Diagnosis Date Noted   Chronic rhinitis 08/06/2022   OSA (obstructive sleep apnea) 08/06/2022   Heart failure, acute diastolic (HCC) 07/21/2022   CKD stage 3a, GFR 45-59 ml/min (HCC) 07/21/2022   Thoracic aortic aneurysm (HCC) 07/21/2022   GERD (gastroesophageal reflux disease) 07/21/2022   Pancreatic lesion 07/21/2022   Macular degeneration 07/21/2022   Hypocalcemia 07/21/2022   Insomnia 07/21/2022   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   TIA (transient ischemic attack) 12/31/2020   Acute on chronic diastolic CHF (congestive heart failure) (HCC) 05/19/2019   Acute CHF (congestive heart failure) (HCC) 05/18/2019   Chronic respiratory failure with hypoxia (HCC) 05/18/2019   COPD (chronic obstructive pulmonary disease) (HCC) 05/18/2019   History of cardiac pacemaker 05/18/2019   AAA (abdominal aortic aneurysm) 05/18/2019   Hyperlipidemia 05/18/2019   Abdominal pain in female 05/30/2015   Essential hypertension 05/30/2015   Crohn's disease (HCC) 05/30/2015   Gastrointestinal hemorrhage with melena 05/30/2015   Blood loss anemia 05/30/2015   Hyponatremia 05/30/2015   Kidney disease 05/30/2015   Normocytic anemia 05/30/2015   CAD S/P percutaneous coronary angioplasty 05/30/2015   Acute blood loss anemia 05/30/2015   Heme + stool     Past Medical History:  Diagnosis Date   AAA (abdominal aortic aneurysm) (HCC)    CHF (congestive heart failure) (HCC)    Crohn's disease (HCC)    Diverticulitis    Diverticulosis    GERD (gastroesophageal reflux disease)    GI bleed    Hypertension    OSA (obstructive sleep apnea)    Pacemaker    Thyroid nodule    Past Surgical History:  Procedure Laterality Date   CHOLECYSTECTOMY      COLONOSCOPY WITH PROPOFOL N/A 06/01/2015   Procedure: COLONOSCOPY WITH PROPOFOL;  Surgeon: Charlott Rakes, MD;  Location[facility] ENDOSCOPY;  Service: Endoscopy;  Laterality: N/A;   CORONARY ANGIOPLASTY WITH STENT PLACEMENT     ESOPHAGOGASTRODUODENOSCOPY (EGD) WITH PROPOFOL N/A 06/01/2015   Procedure: ESOPHAGOGASTRODUODENOSCOPY (EGD) WITH PROPOFOL;  Surgeon: Charlott Rakes, MD;  Location: [facility] ENDOSCOPY;  Service: Endoscopy;  Laterality: N/A;   GIVENS CAPSULE STUDY N/A 06/03/2015   Procedure: GIVENS CAPSULE STUDY;  Surgeon: Charlott Rakes, MD;  Location: [facility] ENDOSCOPY;  Service: Endoscopy;  Laterality: N/A;   PACEMAKER PLACEMENT  2015    Current Outpatient Medications  Medication Instructions   acetaminophen (TYLENOL) 1,000 mg, Oral, Daily   albuterol (VENTOLIN HFA) 108 (90 Base) MCG/ACT inhaler 2 puffs, Inhalation, Every 6 hours PRN   amLODipine (NORVASC) 10 mg, Oral, Every evening   aspirin EC 81 mg, Oral, Daily, Swallow whole.   atorvastatin (LIPITOR) 40 mg, Oral, Every evening   Budeson-Glycopyrrol-Formoterol (BREZTRI AEROSPHERE) 160-9-4.8 MCG/ACT AERO 2 puffs, Inhalation, 2 times daily   Budeson-Glycopyrrol-Formoterol (BREZTRI AEROSPHERE) 160-9-4.8 MCG/ACT AERO 2 puffs, Inhalation, 2 times daily   diphenhydramine-acetaminophen (TYLENOL PM) 25-500 MG TABS tablet 1 tablet, Oral, Daily at bedtime   docusate sodium (COLACE) 200 mg, Oral, 2 times daily   esomeprazole (NEXIUM) 40 mg, Oral, 2 times daily before meals   fluticasone (FLONASE) 50 MCG/ACT nasal spray 2 sprays, Each Nare, Daily   furosemide (LASIX) 60 mg, Oral, Daily   hydroxypropyl methylcellulose / hypromellose (ISOPTO  TEARS / GONIOVISC) 2.5 % ophthalmic solution 1 drop, Both Eyes, 2 times daily PRN   losartan (COZAAR) 12.5 mg, Oral, Daily   melatonin 3 mg, Oral, Daily at bedtime   mesalamine (PENTASA) 1,000 mg, Oral, 2 times daily   Metoprolol Tartrate 75 MG TABS 1 tablet, Oral, 2 times daily   montelukast (SINGULAIR) 10 mg,  Oral, Daily   multivitamin-lutein (OCUVITE-LUTEIN) CAPS capsule 1 capsule, Oral, Daily   polyethylene glycol (MIRALAX / GLYCOLAX) 17 g, Oral, Daily PRN   potassium chloride (KLOR-CON) 20 MEQ packet 20 mEq, Oral, Daily   predniSONE (DELTASONE) 20 MG tablet Take 2 tablets by mouth daily x 1 day; then 1 tablet by mouth daily x 1 day; then half tablet by mouth daily x 3 days and stop prednisone.   Propyl Glycol-Hydroxyethylcell (NASAL MOIST) GEL 1 application , Nasal, 2 times daily   Vitamin D3 5,000 Units, Oral, Daily    Allergies  Allergen Reactions   Ibuprofen     Other reaction(s): gi bleed   Ace Inhibitors     Other reaction(s): cough   Nsaids Nausea And Vomiting    Pt has preference to tylonel    Social History   Occupational History   Not on file  Tobacco Use   Smoking status: Former   Smokeless tobacco: Never  Vaping Use   Vaping status: Never Used  Substance and Sexual Activity   Alcohol use: No    Comment: Quit in 1985   Drug use: No   Sexual activity: Not on file     HPI Patient is in today for follow-up on surveillance for thoracic aortic aneurysm.  She is currently on home oxygen for her COPD and had a recent hospitalization for CHF exacerbation.  Her aneurysm remains at 4.4 cm.  Blood pressure has been under good control.  She has no new or enlarging pulmonary nodules.       Objective:    There were no vitals taken for this visit. BP Readings from Last 3 Encounters:  08/22/22 92/61  08/06/22 112/60  07/24/22 117/66   Wt Readings from Last 3 Encounters:  08/22/22 188 lb (85.3 kg)  08/06/22 188 lb (85.3 kg)  07/24/22 198 lb 3.1 oz (89.9 kg)      Physical Exam Constitutional:      General: She is not in acute distress. HENT:     Head: Normocephalic and atraumatic.     Mouth/Throat:     Mouth: Mucous membranes are moist.     Pharynx: No oropharyngeal exudate.  Eyes:     General: No scleral icterus. Neck:     Vascular: No carotid bruit.   Cardiovascular:     Rate and Rhythm: Normal rate and regular rhythm.     Heart sounds: No murmur heard. Pulmonary:     Effort: Pulmonary effort is normal.     Breath sounds: Normal breath sounds.  Abdominal:     Palpations: Abdomen is soft.     Tenderness: There is no abdominal tenderness.  Musculoskeletal:     Right lower leg: Edema present.     Left lower leg: Edema present.  Skin:    Capillary Refill: Capillary refill takes less than 2 seconds.     Coloration: Skin is not jaundiced.  Neurological:     Mental Status: She is alert and oriented to person, place, and time.  Psychiatric:        Thought Content: Thought content normal.    Narrative & Impression  CLINICAL DATA:  Ascending thoracic aortic aneurysm.   EXAM: CT ANGIOGRAPHY CHEST WITH CONTRAST   TECHNIQUE: Multidetector CT imaging of the chest was performed using the standard protocol during bolus administration of intravenous contrast. Multiplanar CT image reconstructions and MIPs were obtained to evaluate the vascular anatomy.   RADIATION DOSE REDUCTION: This exam was performed according to the departmental dose-optimization program which includes automated exposure control, adjustment of the mA and/or kV according to patient size and/or use of iterative reconstruction technique.   CONTRAST:  75mL ISOVUE-370 IOPAMIDOL (ISOVUE-370) INJECTION 76%   COMPARISON:  CTA chest 07/12/2021.   FINDINGS: Cardiovascular: The ascending thoracic aortic aneurysm measuring up to 4.4 cm in maximum dimension is unchanged when measured again using similar technique. There is no evidence of dissection. There is mild calcified plaque throughout the thoracic aorta. There is no evidence of pulmonary embolism. The heart size is normal. Mild coronary artery calcifications are again seen. There is no pericardial effusion. The left chest wall cardiac device and associated leads are stable.   Mediastinum/Nodes: The right thyroid  lobe is enlarged and heterogeneous, unchanged going back to at least 2022. The esophagus is grossly unremarkable. There is no mediastinal, hilar, or axillary lymphadenopathy.   Lungs/Pleura: The trachea and central airways are patent.   There are small to moderate-sized right and small left pleural effusions with adjacent atelectasis. There is mild interlobular septal thickening. There is no other focal consolidation.   There are no new, enlarging, or suspicious nodules.   Upper Abdomen: A hypodense lesion in the distal pancreatic body is partially imaged but appears unchanged. A left adrenal nodule is unchanged. One year stability favors a benign adenoma.   Musculoskeletal: There is no acute osseous abnormality or suspicious osseous lesion.   Review of the MIP images confirms the above findings.   IMPRESSION: 1. Unchanged 4.4 cm ascending thoracic aortic aneurysm. Recommend continued annual follow-up. 2. Small to moderate-sized right and small left pleural effusions with adjacent atelectasis, and mild interlobular septal thickening consistent with mild pulmonary interstitial edema. Findings may reflect CHF. 3. Hypodense lesion in the distal pancreatic body is partially imaged but appears unchanged, favored benign. A left adrenal nodule is also unchanged, likely a benign adenoma. Recommend continued attention on any follow-up imaging of the abdomen/pelvis.     Electronically Signed   By: Lesia Hausen M.D.   On: 07/19/2022 11:56        Assessment & Plan:   Stable ascending thoracic aortic aneurysm.  I discussed with the patient the importance of ongoing medical management by primary and her specialist.  Should this aneurysm enlarge to the point of surgical intervention she unfortunately would not be a candidate due to age and multiple comorbidities.  Discussed this with her and she is in agreement that we do not need to continue to follow-up with yearly scans.  It is likely  that it will not enlarged to the point of needing surgery but certainly as she is not a candidate there is probably more risk than benefit including potential worsening kidney damage from angiogram.  We will see the patient again on a as needed basis for any requests related to cardiothoracic surgical needs that would be beneficial, but again she is likely not a candidate for any significant surgical procedures.   Problem List Items Addressed This Visit     Thoracic aortic aneurysm (HCC) - Primary    No orders of the defined types were placed in this encounter.   No  follow-ups on file.  Rowe Clack, PA-C

## 2022-08-22 NOTE — Patient Instructions (Signed)
Follow-up as needed.  Keep good control of blood pressure

## 2022-08-29 ENCOUNTER — Ambulatory Visit: Payer: Medicare Other | Admitting: Podiatry

## 2022-08-29 ENCOUNTER — Encounter: Payer: Self-pay | Admitting: Podiatry

## 2022-08-29 DIAGNOSIS — M79674 Pain in right toe(s): Secondary | ICD-10-CM

## 2022-08-29 DIAGNOSIS — D689 Coagulation defect, unspecified: Secondary | ICD-10-CM | POA: Diagnosis not present

## 2022-08-29 DIAGNOSIS — B351 Tinea unguium: Secondary | ICD-10-CM | POA: Diagnosis not present

## 2022-08-29 DIAGNOSIS — M79675 Pain in left toe(s): Secondary | ICD-10-CM | POA: Diagnosis not present

## 2022-08-29 NOTE — Progress Notes (Signed)
This patient returns to my office for at risk foot care.  This patient requires this care by a professional since this patient will be at risk due to having coagulation defect.  This patient is unable to cut nails herself since the patient cannot reach her nails.These nails are painful walking and wearing shoes.  This patient presents for at risk foot care today.  General Appearance  Alert, conversant and in no acute stress.  Vascular  Dorsalis pedis and posterior tibial  pulses are  weakly palpable  bilaterally.  Capillary return is within normal limits  bilaterally. Temperature is within normal limits  bilaterally.  Neurologic  Senn-Weinstein monofilament wire test within normal limits  bilaterally. Muscle power within normal limits bilaterally.  Nails Thick disfigured discolored nails with subungual debris  from hallux to fifth toes bilaterally. No evidence of bacterial infection or drainage bilaterally.  Orthopedic  No limitations of motion  feet .  No crepitus or effusions noted.  No bony pathology or digital deformities noted.  Skin  normotropic skin with no porokeratosis noted bilaterally.  No signs of infections or ulcers noted.     Onychomycosis  Pain in right toes  Pain in left toes  Consent was obtained for treatment procedures.   Mechanical debridement of nails 1-5  bilaterally performed with a nail nipper.  Filed with dremel without incident.    Return office visit     3 months                 Told patient to return for periodic foot care and evaluation due to potential at risk complications.   Gregory Mayer DPM   

## 2022-09-20 ENCOUNTER — Ambulatory Visit: Payer: Medicare Other | Admitting: Nurse Practitioner

## 2022-10-28 ENCOUNTER — Emergency Department (HOSPITAL_COMMUNITY): Payer: Medicare Other

## 2022-10-28 ENCOUNTER — Inpatient Hospital Stay (HOSPITAL_COMMUNITY)
Admission: EM | Admit: 2022-10-28 | Discharge: 2022-11-14 | DRG: 481 | Disposition: A | Discharge status: Skilled Nursing Facility | Payer: Medicare Other | Attending: Family Medicine | Admitting: Family Medicine

## 2022-10-28 ENCOUNTER — Encounter (HOSPITAL_COMMUNITY): Payer: Self-pay

## 2022-10-28 ENCOUNTER — Other Ambulatory Visit: Payer: Self-pay

## 2022-10-28 DIAGNOSIS — W19XXXA Unspecified fall, initial encounter: Secondary | ICD-10-CM

## 2022-10-28 DIAGNOSIS — E860 Dehydration: Secondary | ICD-10-CM | POA: Diagnosis not present

## 2022-10-28 DIAGNOSIS — I2510 Atherosclerotic heart disease of native coronary artery without angina pectoris: Secondary | ICD-10-CM | POA: Diagnosis not present

## 2022-10-28 DIAGNOSIS — M25561 Pain in right knee: Secondary | ICD-10-CM | POA: Diagnosis present

## 2022-10-28 DIAGNOSIS — Z515 Encounter for palliative care: Secondary | ICD-10-CM | POA: Diagnosis not present

## 2022-10-28 DIAGNOSIS — W010XXA Fall on same level from slipping, tripping and stumbling without subsequent striking against object, initial encounter: Secondary | ICD-10-CM | POA: Diagnosis present

## 2022-10-28 DIAGNOSIS — Z801 Family history of malignant neoplasm of trachea, bronchus and lung: Secondary | ICD-10-CM

## 2022-10-28 DIAGNOSIS — J449 Chronic obstructive pulmonary disease, unspecified: Secondary | ICD-10-CM | POA: Diagnosis present

## 2022-10-28 DIAGNOSIS — J301 Allergic rhinitis due to pollen: Secondary | ICD-10-CM

## 2022-10-28 DIAGNOSIS — S72141A Displaced intertrochanteric fracture of right femur, initial encounter for closed fracture: Secondary | ICD-10-CM | POA: Diagnosis present

## 2022-10-28 DIAGNOSIS — I10 Essential (primary) hypertension: Secondary | ICD-10-CM | POA: Diagnosis not present

## 2022-10-28 DIAGNOSIS — Z8709 Personal history of other diseases of the respiratory system: Secondary | ICD-10-CM

## 2022-10-28 DIAGNOSIS — Z79899 Other long term (current) drug therapy: Secondary | ICD-10-CM

## 2022-10-28 DIAGNOSIS — K5090 Crohn's disease, unspecified, without complications: Secondary | ICD-10-CM | POA: Diagnosis present

## 2022-10-28 DIAGNOSIS — S72001A Fracture of unspecified part of neck of right femur, initial encounter for closed fracture: Secondary | ICD-10-CM | POA: Diagnosis present

## 2022-10-28 DIAGNOSIS — Z886 Allergy status to analgesic agent status: Secondary | ICD-10-CM

## 2022-10-28 DIAGNOSIS — I5032 Chronic diastolic (congestive) heart failure: Secondary | ICD-10-CM

## 2022-10-28 DIAGNOSIS — Z950 Presence of cardiac pacemaker: Secondary | ICD-10-CM

## 2022-10-28 DIAGNOSIS — S72001D Fracture of unspecified part of neck of right femur, subsequent encounter for closed fracture with routine healing: Secondary | ICD-10-CM | POA: Diagnosis not present

## 2022-10-28 DIAGNOSIS — N179 Acute kidney failure, unspecified: Secondary | ICD-10-CM | POA: Diagnosis not present

## 2022-10-28 DIAGNOSIS — Y93B9 Activity, other involving muscle strengthening exercises: Secondary | ICD-10-CM | POA: Diagnosis not present

## 2022-10-28 DIAGNOSIS — E876 Hypokalemia: Secondary | ICD-10-CM | POA: Diagnosis not present

## 2022-10-28 DIAGNOSIS — Z7951 Long term (current) use of inhaled steroids: Secondary | ICD-10-CM

## 2022-10-28 DIAGNOSIS — J9611 Chronic respiratory failure with hypoxia: Secondary | ICD-10-CM | POA: Diagnosis present

## 2022-10-28 DIAGNOSIS — I959 Hypotension, unspecified: Secondary | ICD-10-CM | POA: Diagnosis not present

## 2022-10-28 DIAGNOSIS — Z7982 Long term (current) use of aspirin: Secondary | ICD-10-CM

## 2022-10-28 DIAGNOSIS — E875 Hyperkalemia: Secondary | ICD-10-CM | POA: Diagnosis not present

## 2022-10-28 DIAGNOSIS — K9189 Other postprocedural complications and disorders of digestive system: Secondary | ICD-10-CM | POA: Diagnosis not present

## 2022-10-28 DIAGNOSIS — E782 Mixed hyperlipidemia: Secondary | ICD-10-CM

## 2022-10-28 DIAGNOSIS — Z888 Allergy status to other drugs, medicaments and biological substances status: Secondary | ICD-10-CM

## 2022-10-28 DIAGNOSIS — E871 Hypo-osmolality and hyponatremia: Secondary | ICD-10-CM | POA: Diagnosis not present

## 2022-10-28 DIAGNOSIS — J309 Allergic rhinitis, unspecified: Secondary | ICD-10-CM | POA: Diagnosis present

## 2022-10-28 DIAGNOSIS — E8809 Other disorders of plasma-protein metabolism, not elsewhere classified: Secondary | ICD-10-CM | POA: Diagnosis not present

## 2022-10-28 DIAGNOSIS — E785 Hyperlipidemia, unspecified: Secondary | ICD-10-CM | POA: Diagnosis present

## 2022-10-28 DIAGNOSIS — Z9049 Acquired absence of other specified parts of digestive tract: Secondary | ICD-10-CM

## 2022-10-28 DIAGNOSIS — G4733 Obstructive sleep apnea (adult) (pediatric): Secondary | ICD-10-CM | POA: Diagnosis present

## 2022-10-28 DIAGNOSIS — Z66 Do not resuscitate: Secondary | ICD-10-CM | POA: Diagnosis present

## 2022-10-28 DIAGNOSIS — Y92009 Unspecified place in unspecified non-institutional (private) residence as the place of occurrence of the external cause: Secondary | ICD-10-CM | POA: Diagnosis not present

## 2022-10-28 DIAGNOSIS — K56609 Unspecified intestinal obstruction, unspecified as to partial versus complete obstruction: Secondary | ICD-10-CM

## 2022-10-28 DIAGNOSIS — Z6833 Body mass index (BMI) 33.0-33.9, adult: Secondary | ICD-10-CM | POA: Diagnosis not present

## 2022-10-28 DIAGNOSIS — I440 Atrioventricular block, first degree: Secondary | ICD-10-CM | POA: Diagnosis present

## 2022-10-28 DIAGNOSIS — D638 Anemia in other chronic diseases classified elsewhere: Secondary | ICD-10-CM | POA: Diagnosis present

## 2022-10-28 DIAGNOSIS — K567 Ileus, unspecified: Secondary | ICD-10-CM | POA: Diagnosis not present

## 2022-10-28 DIAGNOSIS — Z9981 Dependence on supplemental oxygen: Secondary | ICD-10-CM | POA: Diagnosis not present

## 2022-10-28 DIAGNOSIS — Z955 Presence of coronary angioplasty implant and graft: Secondary | ICD-10-CM

## 2022-10-28 DIAGNOSIS — E8721 Acute metabolic acidosis: Secondary | ICD-10-CM | POA: Diagnosis present

## 2022-10-28 DIAGNOSIS — R531 Weakness: Secondary | ICD-10-CM | POA: Diagnosis not present

## 2022-10-28 DIAGNOSIS — D72829 Elevated white blood cell count, unspecified: Secondary | ICD-10-CM | POA: Diagnosis present

## 2022-10-28 DIAGNOSIS — I5033 Acute on chronic diastolic (congestive) heart failure: Secondary | ICD-10-CM | POA: Diagnosis present

## 2022-10-28 DIAGNOSIS — E042 Nontoxic multinodular goiter: Secondary | ICD-10-CM | POA: Diagnosis present

## 2022-10-28 DIAGNOSIS — Z87891 Personal history of nicotine dependence: Secondary | ICD-10-CM

## 2022-10-28 DIAGNOSIS — I110 Hypertensive heart disease with heart failure: Secondary | ICD-10-CM | POA: Diagnosis present

## 2022-10-28 DIAGNOSIS — Z823 Family history of stroke: Secondary | ICD-10-CM

## 2022-10-28 DIAGNOSIS — E861 Hypovolemia: Secondary | ICD-10-CM | POA: Diagnosis not present

## 2022-10-28 DIAGNOSIS — K219 Gastro-esophageal reflux disease without esophagitis: Secondary | ICD-10-CM | POA: Diagnosis present

## 2022-10-28 DIAGNOSIS — L89152 Pressure ulcer of sacral region, stage 2: Secondary | ICD-10-CM | POA: Clinically undetermined

## 2022-10-28 DIAGNOSIS — E669 Obesity, unspecified: Secondary | ICD-10-CM | POA: Diagnosis present

## 2022-10-28 DIAGNOSIS — Z881 Allergy status to other antibiotic agents status: Secondary | ICD-10-CM

## 2022-10-28 DIAGNOSIS — Z981 Arthrodesis status: Secondary | ICD-10-CM

## 2022-10-28 DIAGNOSIS — S51812A Laceration without foreign body of left forearm, initial encounter: Secondary | ICD-10-CM | POA: Diagnosis present

## 2022-10-28 LAB — CBC WITH DIFFERENTIAL/PLATELET
Abs Immature Granulocytes: 0.05 10*3/uL (ref 0.00–0.07)
Basophils Absolute: 0 10*3/uL (ref 0.0–0.1)
Basophils Relative: 0 %
Eosinophils Absolute: 0.1 10*3/uL (ref 0.0–0.5)
Eosinophils Relative: 2 %
HCT: 34.4 % — ABNORMAL LOW (ref 36.0–46.0)
Hemoglobin: 10.4 g/dL — ABNORMAL LOW (ref 12.0–15.0)
Immature Granulocytes: 1 %
Lymphocytes Relative: 25 %
Lymphs Abs: 2.2 10*3/uL (ref 0.7–4.0)
MCH: 27.6 pg (ref 26.0–34.0)
MCHC: 30.2 g/dL (ref 30.0–36.0)
MCV: 91.2 fL (ref 80.0–100.0)
Monocytes Absolute: 0.9 10*3/uL (ref 0.1–1.0)
Monocytes Relative: 10 %
Neutro Abs: 5.4 10*3/uL (ref 1.7–7.7)
Neutrophils Relative %: 62 %
Platelets: 272 10*3/uL (ref 150–400)
RBC: 3.77 MIL/uL — ABNORMAL LOW (ref 3.87–5.11)
RDW: 17.9 % — ABNORMAL HIGH (ref 11.5–15.5)
WBC: 8.8 10*3/uL (ref 4.0–10.5)
nRBC: 0 % (ref 0.0–0.2)

## 2022-10-28 LAB — BASIC METABOLIC PANEL
Anion gap: 15 (ref 5–15)
BUN: 23 mg/dL (ref 8–23)
CO2: 29 mmol/L (ref 22–32)
Calcium: 8.7 mg/dL — ABNORMAL LOW (ref 8.9–10.3)
Chloride: 100 mmol/L (ref 98–111)
Creatinine, Ser: 1.21 mg/dL — ABNORMAL HIGH (ref 0.44–1.00)
GFR, Estimated: 43 mL/min — ABNORMAL LOW (ref >60)
Glucose, Bld: 154 mg/dL — ABNORMAL HIGH (ref 70–99)
Potassium: 3.7 mmol/L (ref 3.5–5.1)
Sodium: 144 mmol/L (ref 135–145)

## 2022-10-28 MED ORDER — ACETAMINOPHEN 325 MG PO TABS
650.0000 mg | ORAL_TABLET | Freq: Four times a day (QID) | ORAL | Status: DC | PRN
  Administered 2022-11-04 – 2022-11-14 (×10): 650 mg via ORAL
  Filled 2022-10-28 (×14): qty 2

## 2022-10-28 MED ORDER — ONDANSETRON HCL 4 MG/2ML IJ SOLN
4.0000 mg | Freq: Four times a day (QID) | INTRAMUSCULAR | Status: DC | PRN
  Administered 2022-10-29 – 2022-11-12 (×9): 4 mg via INTRAVENOUS
  Filled 2022-10-28 (×9): qty 2

## 2022-10-28 MED ORDER — FENTANYL CITRATE PF 50 MCG/ML IJ SOSY
50.0000 ug | PREFILLED_SYRINGE | Freq: Once | INTRAMUSCULAR | Status: AC
  Administered 2022-10-28: 50 ug via INTRAVENOUS
  Filled 2022-10-28: qty 1

## 2022-10-28 MED ORDER — FLUTICASONE PROPIONATE 50 MCG/ACT NA SUSP
2.0000 | Freq: Every day | NASAL | Status: DC
  Administered 2022-10-31 – 2022-11-11 (×9): 2 via NASAL
  Filled 2022-10-28: qty 16

## 2022-10-28 MED ORDER — ACETAMINOPHEN 650 MG RE SUPP: 650.0000 mg | Freq: Four times a day (QID) | RECTAL | Status: DC | PRN

## 2022-10-28 MED ORDER — NALOXONE HCL 0.4 MG/ML IJ SOLN: 0.4000 mg | INTRAMUSCULAR | Status: DC | PRN

## 2022-10-28 MED ORDER — FLUTICASONE FUROATE-VILANTEROL 200-25 MCG/ACT IN AEPB
1.0000 | INHALATION_SPRAY | Freq: Every day | RESPIRATORY_TRACT | Status: DC
  Administered 2022-10-30: 1 via RESPIRATORY_TRACT
  Filled 2022-10-28: qty 28

## 2022-10-28 MED ORDER — ALBUTEROL SULFATE (2.5 MG/3ML) 0.083% IN NEBU
2.5000 mg | INHALATION_SOLUTION | RESPIRATORY_TRACT | Status: DC | PRN
  Administered 2022-10-30: 2.5 mg via RESPIRATORY_TRACT
  Filled 2022-10-28: qty 3

## 2022-10-28 MED ORDER — MELATONIN 3 MG PO TABS
3.0000 mg | ORAL_TABLET | Freq: Every evening | ORAL | Status: DC | PRN
  Administered 2022-11-04 – 2022-11-13 (×6): 3 mg via ORAL
  Filled 2022-10-28 (×7): qty 1

## 2022-10-28 MED ORDER — UMECLIDINIUM BROMIDE 62.5 MCG/ACT IN AEPB
1.0000 | INHALATION_SPRAY | Freq: Every day | RESPIRATORY_TRACT | Status: DC
  Administered 2022-10-30 – 2022-11-13 (×15): 1 via RESPIRATORY_TRACT
  Filled 2022-10-28 (×3): qty 7

## 2022-10-28 MED ORDER — BUDESON-GLYCOPYRROL-FORMOTEROL 160-9-4.8 MCG/ACT IN AERO: 2.0000 | INHALATION_SPRAY | Freq: Two times a day (BID) | RESPIRATORY_TRACT | Status: DC

## 2022-10-28 MED ORDER — FENTANYL CITRATE PF 50 MCG/ML IJ SOSY
25.0000 ug | PREFILLED_SYRINGE | INTRAMUSCULAR | Status: DC | PRN
  Administered 2022-10-28: 25 ug via INTRAVENOUS
  Filled 2022-10-28: qty 1

## 2022-10-28 NOTE — ED Triage Notes (Signed)
Pt BIBEMs from home s/p trip & fall during physical therapy. C/o Rt hip & knee pain,  Lt arm pain. Denies hitting head or LOC.

## 2022-10-28 NOTE — ED Provider Notes (Addendum)
[place] [facility]MENT AT [facility] Provider Note   CSN: [id number] Arrival date & time: 10/28/22  1902     History  Chief Complaint  Patient presents with   Felicia Benson is a 89 y.o. female.  Patient here after mechanical fall at home.  She was doing some of her physical therapy exercises at home when she tripped on her oxygen tubing.  Landed on her right hip.  Did not hit her head.  She is not on blood thinners.  Pain mostly to the right hip and right knee.  Skin tear to left forearm per EMS.  Is not having any upper extremity pain.  No neck pain.  She wears chronic oxygen.  History of hypertension, AAA, CHF, COPD.  The history is provided by the patient.       Home Medications Prior to Admission medications   Medication Sig Start Date End Date Taking? Authorizing Provider  acetaminophen (TYLENOL) 500 MG tablet Take 1,000 mg by mouth daily.    [provider]  albuterol (VENTOLIN HFA) 108 (90 Base) MCG/ACT inhaler Inhale 2 puffs into the lungs every 6 (six) hours as needed for wheezing or shortness of breath. 07/24/22   [name], MD  amLODipine (NORVASC) 10 MG tablet Take 1 tablet (10 mg total) by mouth every evening. 07/24/22   [name], MD  aspirin EC 81 MG tablet Take 1 tablet (81 mg total) by mouth daily. Swallow whole. 03/29/21   [name], MD  atorvastatin (LIPITOR) 40 MG tablet Take 40 mg by mouth every evening.    [provider]  Budeson-Glycopyrrol-Formoterol (BREZTRI AEROSPHERE) 160-9-4.8 MCG/ACT AERO Inhale 2 puffs into the lungs in the morning and at bedtime. 08/06/22   Cobb, Ruby Cola, NP  Budeson-Glycopyrrol-Formoterol (BREZTRI AEROSPHERE) 160-9-4.8 MCG/ACT AERO Inhale 2 puffs into the lungs in the morning and at bedtime. 08/06/22   Cobb, Ruby Cola, NP  Cholecalciferol (VITAMIN D3) 5000 units TABS Take 5,000 Units by mouth daily.     [provider]  diphenhydramine-acetaminophen (TYLENOL  PM) 25-500 MG TABS tablet Take 1 tablet by mouth at bedtime.    [provider]  docusate sodium (COLACE) 100 MG capsule Take 2 capsules (200 mg total) by mouth 2 (two) times daily. 07/24/22   [name], MD  esomeprazole (NEXIUM) 40 MG capsule Take 40 mg by mouth 2 (two) times daily before a meal. 03/21/19   [provider]  fluticasone (FLONASE) 50 MCG/ACT nasal spray Place 2 sprays into both nostrils daily. 08/06/22   Cobb, Ruby Cola, NP  furosemide (LASIX) 40 MG tablet Take 1.5 tablets (60 mg total) by mouth daily. 07/24/22   [name], MD  hydroxypropyl methylcellulose / hypromellose (ISOPTO TEARS / GONIOVISC) 2.5 % ophthalmic solution Place 1 drop into both eyes 2 (two) times daily as needed for dry eyes. 07/24/22   [name], MD  losartan (COZAAR) 25 MG tablet Take 0.5 tablets (12.5 mg total) by mouth daily. 07/24/22   [name], MD  melatonin 3 MG TABS tablet Take 1 tablet (3 mg total) by mouth at bedtime. 07/24/22   [name], MD  mesalamine (PENTASA) 500 MG CR capsule Take 1,000 mg by mouth 2 (two) times daily.    [provider]  Metoprolol Tartrate 75 MG TABS Take 1 tablet by mouth 2 (two) times daily.    [provider]  montelukast (SINGULAIR) 10 MG tablet Take 1 tablet (10 mg total) by  mouth daily. 08/01/21   [name], Minna Antis, MD  multivitamin-lutein (OCUVITE-LUTEIN) CAPS capsule Take 1 capsule by mouth daily. 07/24/22   [name], MD  polyethylene glycol (MIRALAX / GLYCOLAX) 17 g packet Take 17 g by mouth daily as needed. 07/24/22   [name], MD  potassium chloride (KLOR-CON) 20 MEQ packet Take 20 mEq by mouth daily. 07/24/22   [name], MD  predniSONE (DELTASONE) 20 MG tablet Take 2 tablets by mouth daily x 1 day; then 1 tablet by mouth daily x 1 day; then half tablet by mouth daily x 3 days and stop prednisone. 07/24/22   [name], MD  Propyl Glycol-Hydroxyethylcell (NASAL MOIST) GEL Place 1 application  into  the nose 2 (two) times daily.    [provider]      Allergies    Ibuprofen, Ace inhibitors, Ciprofloxacin, Levofloxacin, and Nsaids    Review of Systems   Review of Systems  Physical Exam Updated Vital Signs BP 135/68 (BP Location: Left Arm)   Pulse 72   Temp 97.6 F (36.4 C) (Tympanic)   Resp 15   Ht 5\' 6"  (1.676 m)   Wt 84.4 kg   SpO2 92%   BMI 30.02 kg/m  Physical Exam Vitals and nursing note reviewed.  Constitutional:      General: She is not in acute distress.    Appearance: She is well-developed. She is not ill-appearing.  HENT:     Head: Normocephalic and atraumatic.     Nose: Nose normal.  Eyes:     Conjunctiva/sclera: Conjunctivae normal.     Pupils: Pupils are equal, round, and reactive to light.  Cardiovascular:     Rate and Rhythm: Normal rate and regular rhythm.     Pulses: Normal pulses.     Heart sounds: No murmur heard. Pulmonary:     Effort: Pulmonary effort is normal. No respiratory distress.     Breath sounds: Normal breath sounds.  Abdominal:     Palpations: Abdomen is soft.     Tenderness: There is no abdominal tenderness.  Musculoskeletal:        General: Swelling and tenderness present.     Cervical back: Neck supple.     Comments: Tenderness to the right hip, tenderness to the right knee with bruising to the right knee, no midline spinal tenderness  Skin:    General: Skin is warm and dry.     Capillary Refill: Capillary refill takes less than 2 seconds.     Comments: Skin tear to left forearm  Neurological:     General: No focal deficit present.     Mental Status: She is alert and oriented to person, place, and time.     Cranial Nerves: No cranial nerve deficit.     Sensory: No sensory deficit.     Motor: No weakness.     Coordination: Coordination normal.     Comments: 5+ out of 5 strength throughout, normal sensation  Psychiatric:        Mood and Affect: Mood normal.     ED Results / Procedures / Treatments    Labs (all labs ordered are listed, but only abnormal results are displayed) Labs Reviewed  CBC WITH DIFFERENTIAL/PLATELET - Abnormal; Notable for the following components:      Result Value   RBC 3.77 (*)    Hemoglobin 10.4 (*)    HCT 34.4 (*)    RDW 17.9 (*)    All other components within normal limits  BASIC  METABOLIC PANEL - Abnormal; Notable for the following components:   Glucose, Bld 154 (*)    Creatinine, Ser 1.21 (*)    Calcium 8.7 (*)    GFR, Estimated 43 (*)    All other components within normal limits  URINALYSIS, ROUTINE W REFLEX MICROSCOPIC    EKG EKG Interpretation Date/Time:  Sunday October 28 2022 19:17:59 EDT Ventricular Rate:  63 PR Interval:  214 QRS Duration:  149 QT Interval:  480 QTC Calculation: 492 R Axis:   -84  Text Interpretation: Sinus rhythm Borderline prolonged PR interval N Confirmed by Virgina Norfolk [phone]) on 10/28/2022 7:49:33 PM  Radiology CT Cervical Spine Wo Contrast  Result Date: 10/28/2022 CLINICAL DATA:  Poly trauma, blunt.  Trip and fall injury. EXAM: CT CERVICAL SPINE WITHOUT CONTRAST TECHNIQUE: Multidetector CT imaging of the cervical spine was performed without intravenous contrast. Multiplanar CT image reconstructions were also generated. RADIATION DOSE REDUCTION: This exam was performed according to the departmental dose-optimization program which includes automated exposure control, adjustment of the mA and/or kV according to patient size and/or use of iterative reconstruction technique. COMPARISON:  CTA neck 12/31/2020 FINDINGS: Alignment: Normal alignment. Skull base and vertebrae: No acute fracture. No primary bone lesion or focal pathologic process. Soft tissues and spinal canal: No prevertebral fluid or swelling. No visible canal hematoma. Disc levels: Postoperative anterior plate and screw fixation and intervertebral fusion from C3 through C5. Fused segments appears intact. Surgical hardware appears intact. Degenerative  changes at the other levels with disc space narrowing and endplate osteophyte formation. Degenerative changes in the facet joints. Upper chest: Mild interstitial changes in the lung apices, possibly edema. Surgical absence of the left thyroid gland. Diffuse heterogeneous enlargement of the right thyroid and isthmus. No change since previous study. Appearance is likely indicate nodular goiter. Other: None. IMPRESSION: 1. Normal alignment.  No acute displaced fractures identified. 2. Postoperative anterior fixation and intervertebral fusion from C3 through C5. Fused segments appear intact. 3. Mild interstitial changes in the lung apices may indicate edema. 4. Stable heterogeneous enlargement of the right thyroid and isthmus likely indicating nodular goiter. This has been evaluated on previous imaging. (ref: J Am Coll Radiol. 2015 Feb;12(2): 143-50). Electronically Signed   By: Burman Nieves M.D.   On: 10/28/2022 21:18   CT HEAD WO CONTRAST ( )  Result Date: 10/28/2022 CLINICAL DATA:  Poly trauma, blunt. Trip and fall injury during physical therapy. No loss of consciousness. EXAM: CT HEAD WITHOUT CONTRAST TECHNIQUE: Contiguous axial images were obtained from the base of the skull through the vertex without intravenous contrast. RADIATION DOSE REDUCTION: This exam was performed according to the departmental dose-optimization program which includes automated exposure control, adjustment of the mA and/or kV according to patient size and/or use of iterative reconstruction technique. COMPARISON:  MRI brain 01/11/2021.  CT head 12/31/2020 FINDINGS: Brain: Mild diffuse cerebral atrophy. Ventricular dilatation consistent with central atrophy. Low-attenuation changes in the deep white matter consistent with small vessel ischemia. No mass-effect or midline shift. No abnormal extra-axial fluid collections. Gray-white matter junctions are distinct. Basal cisterns are not effaced. No acute intracranial hemorrhage. There  is an extra-axial soft tissue lesion in the left cavernous sinus and adjacent to the anterior margin of the left pons. This measures about 7 x 10 mm. No significant change in appearance since prior studies. Most likely a small meningioma or possibly neurogenic tumor. Vascular: No hyperdense vessel or unexpected calcification. Skull: Normal. Negative for fracture or focal lesion. Sinuses/Orbits: No acute finding. Other: None.  IMPRESSION: 1. No acute intracranial abnormalities. 2. Chronic atrophy and small vessel ischemic changes. 3. Extra-axial soft tissue lesion in the left cavernous sinus region, unchanged since prior studies. This is most likely a small meningioma or possibly neurogenic tumor. Electronically Signed   By: Burman Nieves M.D.   On: 10/28/2022 21:13   DG Knee Complete 4 Views Right  Result Date: 10/28/2022 CLINICAL DATA:  Pain after a fall. EXAM: RIGHT KNEE - COMPLETE 4+ VIEW COMPARISON:  MRI 05/01/2021 FINDINGS: Degenerative changes in the right knee with medial and lateral compartment narrowing and tricompartment osteophyte formation. No evidence of acute fracture or dislocation. No focal bone lesion or bone destruction. No significant effusions. Vascular calcifications. Soft tissue swelling over the infrapatellar region likely representing contusion. IMPRESSION: 1. No acute fracture or dislocation. 2. Moderate tricompartment degenerative changes. 3. Soft tissue swelling inferior to the patella likely representing soft tissue contusion. No effusion. Electronically Signed   By: Burman Nieves M.D.   On: 10/28/2022 21:06   DG Forearm Left  Result Date: 10/28/2022 CLINICAL DATA:  Pain after a fall. EXAM: LEFT FOREARM - 2 VIEW COMPARISON:  None Available. FINDINGS: Degenerative changes demonstrated in the radiocarpal and STT joints of the left wrist. Calcification of the triangular fibrocartilage. Radius and ulna appear intact. No acute displaced fractures identified. Soft tissues are  unremarkable. IMPRESSION: Degenerative changes in the carpal bones. No acute bony abnormalities. Electronically Signed   By: Burman Nieves M.D.   On: 10/28/2022 21:05   DG Chest Portable 1 View  Result Date: 10/28/2022 CLINICAL DATA:  Larey Seat during physical therapy. Pain to the right hip and left forearm. EXAM: PORTABLE CHEST 1 VIEW COMPARISON:  07/21/2022 FINDINGS: Cardiac pacemaker. Borderline heart size with normal pulmonary vascularity. Emphysematous changes and interstitial changes in the lungs likely indicating chronic bronchitis. Pleural effusions and consolidation seen previously have resolved. No pneumothorax. Mediastinal contours appear intact. Calcification of the aorta. Degenerative changes in the spine and shoulders. Postoperative changes in the cervical spine. IMPRESSION: Emphysematous and chronic bronchitic changes in the lungs. No focal consolidation. Electronically Signed   By: Burman Nieves M.D.   On: 10/28/2022 21:04   DG Hip Unilat With Pelvis 2-3 Views Right  Result Date: 10/28/2022 CLINICAL DATA:  pain EXAM: DG HIP (WITH OR WITHOUT PELVIS) 2-3V RIGHT COMPARISON:  None Available. FINDINGS: Acute displaced right intertrochanteric femoral fracture. No right hip dislocation. No acute displaced fracture or dislocation of the left hip on frontal view. No acute displaced fracture or diastasis of the bones of the pelvis. At least mild right and moderate left hip degenerative changes. No aggressive appearing focal bone abnormality. Visualized lower lumbar spine demonstrates degenerative changes. Atherosclerotic plaque. IMPRESSION: Acute displaced right intertrochanteric femoral fracture. Electronically Signed   By: Tish Frederickson M.D.   On: 10/28/2022 21:03    Procedures Procedures    Medications Ordered in ED Medications  fentaNYL (SUBLIMAZE) injection 50 mcg (50 mcg Intravenous Given 10/28/22 2050)    ED Course/ Medical Decision Making/ A&P                                  Medical Decision Making Amount and/or Complexity of Data Reviewed Labs: ordered. Radiology: ordered.  Risk Prescription drug management. Decision regarding hospitalization.   Felicia Benson is here after mechanical fall.  History of hypertension, COPD on chronic oxygen.  History of CHF.  Normal vitals.  No fever.  Tripped  over her oxygen tubing while doing some physical therapy exercises.  Pain to the right hip, right knee.  She did not hit her head or lose consciousness.  Overall we will get a head CT neck CT to evaluate given concern for distracting injury.  Will get x-ray of the right hip, right knee.  She has some swelling to the right knee as well.  Leg looks shortened and rotated.  She has no midline spinal pain.  Will get basic labs and EKG for medical clearance suspecting that there is a hip fracture.  Per my review interpretation of EKG sinus rhythm.  No ischemic changes.  Per my review patient x-ray does appear that she has a right hip fracture.  Radiology report does confirm acute displaced right intertrochanteric femoral fracture.  Otherwise images are unremarkable per radiology report and my review and interpretation as well.  Incidentally radiology does see likely small meningioma on CT of head.  Knee x-ray with no fracture but does have contusion likely.  Medical screening labs for my review and interpretation are unremarkable.  I will talk with Dr. Charlann Boxer with orthopedics and admit to medicine for further care.  This chart was dictated using voice recognition software.  Despite best efforts to proofread,  errors can occur which can change the documentation meaning.         Final Clinical Impression(s) / ED Diagnoses Final diagnoses:  Closed fracture of right hip, initial encounter [facility])    Rx / DC Orders ED Discharge Orders     None         Virgina Norfolk, DO 10/28/22 2120    Virgina Norfolk, DO 10/28/22 2210

## 2022-10-28 NOTE — H&P (Signed)
History and Physical      Felicia Benson:[id number] DOB: 30-Aug-1930 DOA: 10/28/2022; DOS: 10/28/2022  PCP: [name], MD  Patient coming from: home   I have personally briefly reviewed patient's old medical records in [facility]  Chief Complaint: right hip pain  HPI: Felicia Benson is a 89 y.o. female with medical history significant for chronic hypoxic respiratory failure on baseline and 4 L continuous nasal cannula, COPD, chronic diastolic heart failure, essential pretension, hyperlipidemia anemia of chronic disease is a baseline hemoglobin range 10-12, who is admitted to [facility] on 10/28/2022 with acute right hip fracture after presenting from home to [facility] ED complaining of right hip pain.   Patient reports tripping while attempting to perform her physical therapy activities at home during which she tripped over her supplemental oxygen tubing, resulting in a ground level fall during which right hip was the principal point of contact with the floor below. As a result of this fall, the patient reports immediate development of sharp right hip pain, with radiation into the right groin. States that this pain has been constant since onset, with exacerbation when attempting to move the right lower extremity.  As a consequence of the associated intensity of this discomfort, reports that he is unable to bear weight on the right lower extremity at this time.  He also notes some mild acute right knee pain that started at the time of the aforementioned ground-level fall.  Otherwise, denies any acute arthralgias or myalgias as a result of the above fall.  Denies any acute numbness or paresthesias in bilateral lower extremities, and confirms that right hip representations a native joint.   Did not hit head as a component of this fall, and denies any associated loss of consciousness.  Denies any preceding or associated chest pain, shortness of breath, diaphoresis, palpitations,  nausea, vomiting, dizziness, presyncope, or syncope.  Denies any subsequent headache, neck pain, blurry vision, or diplopia.   She is on daily baby aspirin as an outpatient, but otherwise, not on any additional blood thinners.  She has a history of chronic diastolic heart failure, but denies any recent worsening of her chronic mild shortness of breath, and or any recent orthopnea, PND, or worsening of peripheral edema.     ED Course:  Vital signs in the ED were notable for the following: Afebrile; rates in the 60s to 80s; systolic pressures in the 120s to 130s; respiratory rate 16-22, oxygen saturation 95 to 100% on her baseline 4 L continuous nasal cannula.  Labs were notable for the following: BMP notable for sodium 144, creatinine 1.21.  CBC notable for the cell count 8800, hemoglobin 10.4 assesses Neuraceq/0  Reasonable to doses prior hemoglobin data point of 9.8 on 07/22/2022, bili, two 7 degree urinalysis ordered: Result currently pending.  Per my interpretation, EKG in ED demonstrated the following: Sinus rhythm with first-degree AV block, heart rate 60, nonspecific T wave inversion in aVL, and no evidence of ST changes, Cleen evidence of ST elevation.  Imaging in the ED, per corresponding formal radiology read, was notable for the following: Plain film of the right hip showed acute displaced right intertrochanteric femoral fracture.  Plain films of the right knee showed no evidence of acute fracture or dislocation.  Chest x-ray, 1 view, showed no evidence of acute cardiopulmonary process, Cleen is a Entrikin edema, effusion, pneumothorax.  Noncontrast CTh evidence of acute intracranial process, Cleen evidence of intracranial strain and subacute infarct.  CT  cervical spine showed evidence of acute fracture.  EDP spoke with Dr. Charlann [facility]er of [facility], who rec [facility] admission, and conveyed that he will formally consult, see the patient in the AM, and anticipates taking pt to the OR, requesting NPO  at MN.   While in the ED, the following were administered: Fentanyl 50 mg IV x 1 dose.  Subsequently, the patient was admitted for further evaluation management of her presenting acute displaced right intertrochanteric femoral fracture stemming from a mechanical fall without loss of consciousness.    Review of Systems: As per HPI otherwise 10 point review of systems negative.   Past Medical History:  Diagnosis Date   AAA (abdominal aortic aneurysm) (HCC)    CHF (congestive heart failure) (HCC)    Crohn's disease (HCC)    Diverticulitis    Diverticulosis    GERD (gastroesophageal reflux disease)    GI bleed    Hypertension    OSA (obstructive sleep apnea)    Pacemaker    Thyroid nodule     Past Surgical History:  Procedure Laterality Date   CHOLECYSTECTOMY     COLONOSCOPY WITH PROPOFOL N/A 06/01/2015   Procedure: COLONOSCOPY WITH PROPOFOL;  Surgeon: Charlott Rakes, MD;  Location: [facility] ENDOSCOPY;  Service: Endoscopy;  Laterality: N/A;   CORONARY ANGIOPLASTY WITH STENT PLACEMENT     ESOPHAGOGASTRODUODENOSCOPY (EGD) WITH PROPOFOL N/A 06/01/2015   Procedure: ESOPHAGOGASTRODUODENOSCOPY (EGD) WITH PROPOFOL;  Surgeon: Charlott Rakes, MD;  Location: [facility] ENDOSCOPY;  Service: Endoscopy;  Laterality: N/A;   GIVENS CAPSULE STUDY N/A 06/03/2015   Procedure: GIVENS CAPSULE STUDY;  Surgeon: Charlott Rakes, MD;  Location: [facility] ENDOSCOPY;  Service: Endoscopy;  Laterality: N/A;   PACEMAKER PLACEMENT  2015    Social History:  reports that she has quit smoking. She has never used smokeless tobacco. She reports that she does not drink alcohol and does not use drugs.   Allergies  Allergen Reactions   Ibuprofen     Other reaction(s): gi bleed   Ace Inhibitors     Other reaction(s): cough   Ciprofloxacin     Other Reaction(s): avoid fluoroquinolones due to asc aortic aneurysm   Levofloxacin     Other Reaction(s): aneurysm   Nsaids Nausea And Vomiting    Pt has preference to tylonel     Family History  Problem Relation Age of Onset   CVA Mother    Lung cancer Father    Colon cancer Neg Hx     Family history reviewed and not pertinent    Prior to Admission medications   Medication Sig Start Date End Date Taking? Authorizing Provider  acetaminophen (TYLENOL) 500 MG tablet Take 1,000 mg by mouth daily.    [provider]  albuterol (VENTOLIN HFA) 108 (90 Base) MCG/ACT inhaler Inhale 2 puffs into the lungs every 6 (six) hours as needed for wheezing or shortness of breath. 07/24/22   [name], MD  amLODipine (NORVASC) 10 MG tablet Take 1 tablet (10 mg total) by mouth every evening. 07/24/22   [name], MD  aspirin EC 81 MG tablet Take 1 tablet (81 mg total) by mouth daily. Swallow whole. 03/29/21   [name], MD  atorvastatin (LIPITOR) 40 MG tablet Take 40 mg by mouth every evening.    [provider]  Budeson-Glycopyrrol-Formoterol (BREZTRI AEROSPHERE) 160-9-4.8 MCG/ACT AERO Inhale 2 puffs into the lungs in the morning and at bedtime. 08/06/22   Cobb, Ruby Cola, NP  Budeson-Glycopyrrol-Formoterol (BREZTRI AEROSPHERE) 160-9-4.8 MCG/ACT AERO Inhale 2 puffs into  the lungs in the morning and at bedtime. 08/06/22   Cobb, Ruby Cola, NP  Cholecalciferol (VITAMIN D3) 5000 units TABS Take 5,000 Units by mouth daily.     [provider]  diphenhydramine-acetaminophen (TYLENOL PM) 25-500 MG TABS tablet Take 1 tablet by mouth at bedtime.    [provider]  docusate sodium (COLACE) 100 MG capsule Take 2 capsules (200 mg total) by mouth 2 (two) times daily. 07/24/22   [name], MD  esomeprazole (NEXIUM) 40 MG capsule Take 40 mg by mouth 2 (two) times daily before a meal. 03/21/19   [provider]  fluticasone (FLONASE) 50 MCG/ACT nasal spray Place 2 sprays into both nostrils daily. 08/06/22   Cobb, Ruby Cola, NP  furosemide (LASIX) 40 MG tablet Take 1.5 tablets (60 mg total) by mouth daily. 07/24/22   [name], MD   hydroxypropyl methylcellulose / hypromellose (ISOPTO TEARS / GONIOVISC) 2.5 % ophthalmic solution Place 1 drop into both eyes 2 (two) times daily as needed for dry eyes. 07/24/22   [name], MD  losartan (COZAAR) 25 MG tablet Take 0.5 tablets (12.5 mg total) by mouth daily. 07/24/22   [name], MD  melatonin 3 MG TABS tablet Take 1 tablet (3 mg total) by mouth at bedtime. 07/24/22   [name], MD  mesalamine (PENTASA) 500 MG CR capsule Take 1,000 mg by mouth 2 (two) times daily.    [provider]  Metoprolol Tartrate 75 MG TABS Take 1 tablet by mouth 2 (two) times daily.    [provider]  montelukast (SINGULAIR) 10 MG tablet Take 1 tablet (10 mg total) by mouth daily. 08/01/21   [name], Minna Antis, MD  multivitamin-lutein (OCUVITE-LUTEIN) CAPS capsule Take 1 capsule by mouth daily. 07/24/22   [name], MD  polyethylene glycol (MIRALAX / GLYCOLAX) 17 g packet Take 17 g by mouth daily as needed. 07/24/22   [name], MD  potassium chloride (KLOR-CON) 20 MEQ packet Take 20 mEq by mouth daily. 07/24/22   [name], MD  predniSONE (DELTASONE) 20 MG tablet Take 2 tablets by mouth daily x 1 day; then 1 tablet by mouth daily x 1 day; then half tablet by mouth daily x 3 days and stop prednisone. 07/24/22   [name], MD  Propyl Glycol-Hydroxyethylcell (NASAL MOIST) GEL Place 1 application  into the nose 2 (two) times daily.    [provider]     Objective    Physical Exam: Vitals:   10/28/22 2145 10/28/22 2200 10/28/22 2215 10/28/22 2230  BP: 128/70 124/80 135/85 124/77  Pulse: 80 82 83 87  Resp: (!) 24 (!) 23 (!) 23 (!) 29  Temp:      TempSrc:      SpO2: 97% 98% 97% 100%  Weight:      Height:        General: appears to be stated age; alert, oriented Skin: warm, dry, no rash Head:  AT/[place] Mouth:  Oral mucosa membranes appear moist, normal dentition Neck: supple; trachea midline Heart:  RRR; did not appreciate any M/R/G Lungs:  CTAB, did not appreciate any wheezes, rales, or rhonchi Abdomen: + BS; soft, ND, NT Vascular: 2+ pedal pulses b/l; 2+ radial pulses b/l Extremities: no peripheral edema, no muscle wasting; right lower extremity externally rotated and short relative to the left counterpart Neuro: sensation intact in upper and lower extremities b/l; refrain from assessment of strength of the right lower extremity in the setting of presenting acute right hip fracture  Labs on Admission: I have personally reviewed following labs and imaging studies  CBC: Recent Labs  Lab 10/28/22 1919  WBC 8.8  NEUTROABS 5.4  HGB 10.4*  HCT 34.4*  MCV 91.2  PLT 272   Basic Metabolic Panel: Recent Labs  Lab 10/28/22 1919  NA 144  K 3.7  CL 100  CO2 29  GLUCOSE 154*  BUN 23  CREATININE 1.21*  CALCIUM 8.7*   GFR: Estimated Creatinine Clearance: 33.8 mL/min (A) (by C-G formula based on SCr of 1.21 mg/dL (H)). Liver Function Tests: No results for input(s): "AST", "ALT", "ALKPHOS", "BILITOT", "PROT", "ALBUMIN" in the last 168 hours. No results for input(s): "LIPASE", "AMYLASE" in the last 168 hours. No results for input(s): "AMMONIA" in the last 168 hours. Coagulation Profile: No results for input(s): "INR", "PROTIME" in the last 168 hours. Cardiac Enzymes: No results for input(s): "CKTOTAL", "CKMB", "CKMBINDEX", "TROPONINI" in the last 168 hours. BNP (last 3 results) No results for input(s): "PROBNP" in the last 8760 hours. HbA1C: No results for input(s): "HGBA1C" in the last 72 hours. CBG: No results for input(s): "GLUCAP" in the last 168 hours. Lipid Profile: No results for input(s): "CHOL", "HDL", "LDLCALC", "TRIG", "CHOLHDL", "LDLDIRECT" in the last 72 hours. Thyroid Function Tests: No results for input(s): "TSH", "T4TOTAL", "FREET4", "T3FREE", "THYROIDAB" in the last 72 hours. Anemia Panel: No results for input(s): "VITAMINB12", "FOLATE", "FERRITIN", "TIBC", "IRON", "RETICCTPCT" in the last 72  hours. Urine analysis:    Component Value Date/Time   COLORURINE YELLOW 12/31/2020 0100   APPEARANCEUR CLEAR 12/31/2020 0100   LABSPEC 1.020 12/31/2020 0100   PHURINE 6.0 12/31/2020 0100   GLUCOSEU NEGATIVE 12/31/2020 0100   HGBUR NEGATIVE 12/31/2020 0100   BILIRUBINUR NEGATIVE 12/31/2020 0100   KETONESUR NEGATIVE 12/31/2020 0100   PROTEINUR NEGATIVE 12/31/2020 0100   NITRITE POSITIVE (A) 12/31/2020 0100   LEUKOCYTESUR NEGATIVE 12/31/2020 0100    Radiological Exams on Admission: CT Cervical Spine Wo Contrast  Result Date: 10/28/2022 CLINICAL DATA:  Poly trauma, blunt.  Trip and fall injury. EXAM: CT CERVICAL SPINE WITHOUT CONTRAST TECHNIQUE: Multidetector CT imaging of the cervical spine was performed without intravenous contrast. Multiplanar CT image reconstructions were also generated. RADIATION DOSE REDUCTION: This exam was performed according to the departmental dose-optimization program which includes automated exposure control, adjustment of the mA and/or kV according to patient size and/or use of iterative reconstruction technique. COMPARISON:  CTA neck 12/31/2020 FINDINGS: Alignment: Normal alignment. Skull base and vertebrae: No acute fracture. No primary bone lesion or focal pathologic process. Soft tissues and spinal canal: No prevertebral fluid or swelling. No visible canal hematoma. Disc levels: Postoperative anterior plate and screw fixation and intervertebral fusion from C3 through C5. Fused segments appears intact. Surgical hardware appears intact. Degenerative changes at the other levels with disc space narrowing and endplate osteophyte formation. Degenerative changes in the facet joints. Upper chest: Mild interstitial changes in the lung apices, possibly edema. Surgical absence of the left thyroid gland. Diffuse heterogeneous enlargement of the right thyroid and isthmus. No change since previous study. Appearance is likely indicate nodular goiter. Other: None. IMPRESSION: 1.  Normal alignment.  No acute displaced fractures identified. 2. Postoperative anterior fixation and intervertebral fusion from C3 through C5. Fused segments appear intact. 3. Mild interstitial changes in the lung apices may indicate edema. 4. Stable heterogeneous enlargement of the right thyroid and isthmus likely indicating nodular goiter. This has been evaluated on previous imaging. (ref: J Am Coll Radiol. 2015 Feb;12(2): 143-50). Electronically Signed  By: Burman Nieves M.D.   On: 10/28/2022 21:18   CT HEAD WO CONTRAST ( )  Result Date: 10/28/2022 CLINICAL DATA:  Poly trauma, blunt. Trip and fall injury during physical therapy. No loss of consciousness. EXAM: CT HEAD WITHOUT CONTRAST TECHNIQUE: Contiguous axial images were obtained from the base of the skull through the vertex without intravenous contrast. RADIATION DOSE REDUCTION: This exam was performed according to the departmental dose-optimization program which includes automated exposure control, adjustment of the mA and/or kV according to patient size and/or use of iterative reconstruction technique. COMPARISON:  MRI brain 01/11/2021.  CT head 12/31/2020 FINDINGS: Brain: Mild diffuse cerebral atrophy. Ventricular dilatation consistent with central atrophy. Low-attenuation changes in the deep white matter consistent with small vessel ischemia. No mass-effect or midline shift. No abnormal extra-axial fluid collections. Gray-white matter junctions are distinct. Basal cisterns are not effaced. No acute intracranial hemorrhage. There is an extra-axial soft tissue lesion in the left cavernous sinus and adjacent to the anterior margin of the left pons. This measures about 7 x 10 mm. No significant change in appearance since prior studies. Most likely a small meningioma or possibly neurogenic tumor. Vascular: No hyperdense vessel or unexpected calcification. Skull: Normal. Negative for fracture or focal lesion. Sinuses/Orbits: No acute finding. Other:  None. IMPRESSION: 1. No acute intracranial abnormalities. 2. Chronic atrophy and small vessel ischemic changes. 3. Extra-axial soft tissue lesion in the left cavernous sinus region, unchanged since prior studies. This is most likely a small meningioma or possibly neurogenic tumor. Electronically Signed   By: Burman Nieves M.D.   On: 10/28/2022 21:13   DG Knee Complete 4 Views Right  Result Date: 10/28/2022 CLINICAL DATA:  Pain after a fall. EXAM: RIGHT KNEE - COMPLETE 4+ VIEW COMPARISON:  MRI 05/01/2021 FINDINGS: Degenerative changes in the right knee with medial and lateral compartment narrowing and tricompartment osteophyte formation. No evidence of acute fracture or dislocation. No focal bone lesion or bone destruction. No significant effusions. Vascular calcifications. Soft tissue swelling over the infrapatellar region likely representing contusion. IMPRESSION: 1. No acute fracture or dislocation. 2. Moderate tricompartment degenerative changes. 3. Soft tissue swelling inferior to the patella likely representing soft tissue contusion. No effusion. Electronically Signed   By: Burman Nieves M.D.   On: 10/28/2022 21:06   DG Forearm Left  Result Date: 10/28/2022 CLINICAL DATA:  Pain after a fall. EXAM: LEFT FOREARM - 2 VIEW COMPARISON:  None Available. FINDINGS: Degenerative changes demonstrated in the radiocarpal and STT joints of the left wrist. Calcification of the triangular fibrocartilage. Radius and ulna appear intact. No acute displaced fractures identified. Soft tissues are unremarkable. IMPRESSION: Degenerative changes in the carpal bones. No acute bony abnormalities. Electronically Signed   By: Burman Nieves M.D.   On: 10/28/2022 21:05   DG Chest Portable 1 View  Result Date: 10/28/2022 CLINICAL DATA:  Larey Seat during physical therapy. Pain to the right hip and left forearm. EXAM: PORTABLE CHEST 1 VIEW COMPARISON:  07/21/2022 FINDINGS: Cardiac pacemaker. Borderline heart size with  normal pulmonary vascularity. Emphysematous changes and interstitial changes in the lungs likely indicating chronic bronchitis. Pleural effusions and consolidation seen previously have resolved. No pneumothorax. Mediastinal contours appear intact. Calcification of the aorta. Degenerative changes in the spine and shoulders. Postoperative changes in the cervical spine. IMPRESSION: Emphysematous and chronic bronchitic changes in the lungs. No focal consolidation. Electronically Signed   By: Burman Nieves M.D.   On: 10/28/2022 21:04   DG Hip Unilat With Pelvis 2-3 Views Right  Result Date: 10/28/2022 CLINICAL DATA:  pain EXAM: DG HIP (WITH OR WITHOUT PELVIS) 2-3V RIGHT COMPARISON:  None Available. FINDINGS: Acute displaced right intertrochanteric femoral fracture. No right hip dislocation. No acute displaced fracture or dislocation of the left hip on frontal view. No acute displaced fracture or diastasis of the bones of the pelvis. At least mild right and moderate left hip degenerative changes. No aggressive appearing focal bone abnormality. Visualized lower lumbar spine demonstrates degenerative changes. Atherosclerotic plaque. IMPRESSION: Acute displaced right intertrochanteric femoral fracture. Electronically Signed   By: Tish Frederickson M.D.   On: 10/28/2022 21:03      Assessment/Plan   Principal Problem:   Closed right hip fracture [facility]) Active Problems:   Chronic diastolic CHF (congestive heart failure) (HCC)   Essential hypertension   Chronic hypoxic respiratory failure (HCC)   Hyperlipidemia   Fall at home, initial encounter   History of COPD   Allergic rhinitis   Anemia of chronic disease     #) Acute displaced right intertrochanteric femoral fracture: confirmed via presenting plain films and stemming from ground level mechanical fall without associated loss of consciousness that occurred earlier on the day of admission, as further described above, resulting in immediate develop of  acute right hip pain.  At this time, the right lower extremity appears neurovascularly intact, and patient reports adequate pain control.  On daily baby aspirin at home, but otherwise no additional blood thinners.    The patient's case/imaging were discussed with the on-call orthopedic surgeon, Dr. Charlann Boxer of [facility],  who recommended admission to the hospitalist service for further evaluation/management of acute right hip fx, including preoperative medical optimization, and plan to take pt to the OR tomorrow (10/21) for definitive surgical management. Consistent with this, orthopedic surgery requests that pt be made NPO at MN.   Gupta Score for this patient in the context of anticipated aforementioned orthopedic surgery conveys a  4.84% perioperative risk for significant cardiac event. No evidence to suggest acutely decompensated heart failure or acute MI. Consequently, no absolute contraindications to proceeding with proposed orthopedic surgery at this time.   Plan: Formal orthopedic surgery consult for definitive surgical management, with plan to take patient to the OR tomorrow , 10/21. NPO after MN in anticipation of this procedure.  No pharmacologic anticoagulation leading up to this anticipated surgery. SCD's. Prn IV fentanyl. Anticipate postoperative PT consult. Check INR.  Check 25-hydroxy vit D level to assess for any underlying pathological contribution towards the patient's fracture stemming from associated vitamin deficiency. Type and screen ordered.  Holding home daily baby aspirin for now.                   #) Ground level mechanical fall: The patient reports a ground level mechanical fall earlier today in which she tripped over her home oxygen tubing without any associated loss of consciousness. Appears to be purely mechanical in nature, without clinical evidence at this time to suggest contributory dizziness, presyncope, syncope, or acute ischemic CVA. Does not appear to have  hit head as component of this fall. presenting CT head showed no evidence of acute intracranial process, including no evidence of intracranial hemorrhage, and presentation does not appear to be associated any acute neurologic deficits.  While this fall appears to be purely mechanical in nature, will also check urinalysis to evaluate for any underlying infectious contribution.   Plan: Check urinalysis, as above.  CMP and CBC with differential in the morning. Fall precautions. Anticipate postoperative PT consult.                #)  Chronic hypoxic respiratory failure: Documented history of such in the setting of her severe COPD, on chronic 4 L nasal cannula at baseline, pulm which the patient has been maintaining oxygen saturations in the range of 97 to 100% in the ED thus far.  Plan: Continue supplemental oxygen, as above.  Resume home scheduled Breztri.  As needed albuterol nebulizer ordered.  Check serum magnesium level.  Monitor strict I's and O's and daily weights.                 #) COPD: Documented history thereof, without clinical evidence of acute exacerbation at this time.  Outpatient respiratory regimen includes the following: Scheduled Breztri inhaler as well as as needed albuterol inhaler.  Oxygen saturations in the high 90s to 100% on her baseline 4 L continuous nasal cannula.  Plan: cont outpatient Breztri inhaler. Prn albuterol nebulizer. Check CMP and serum magnesium level in the AM.                     #) Chronic diastolic heart failure: documented history of such, with most recent echocardiogram performed in July 2024, which is notable for LVEF 66 hypersynchrony grade 1 diastolic dysfunction, and normal right ventricular systolic function. No clinical or radiographic evidence to suggest acutely decompensated heart failure at this time. home diuretic regimen reportedly consists of the following: Lasix 60 mg p.o. daily.  She is also on potassium  chloride 20 mill colons p.o. daily.   Plan: monitor strict I's & O's and daily weights. Repeat CMP in AM. Check serum mag level.  In the setting of current n.p.o. status leading up to anticipated definitive surgical correction relating to her presenting acute right hip fracture, will hold next dose of oral Lasix and potassium chloride.                 #) Essential Hypertension: documented h/o such, with outpatient antihypertensive regimen including Lasix, losartan, Norvasc, metoprolol tartrate.  SBP's in the ED today: 120s 130s mmHg.   Plan: Close monitoring of subsequent BP via routine VS. in the setting of current n.p.o. status, will hold him and upper tensive medications for now.  Monitor strict I's and O's and daily weights.                   #) Hyperlipidemia: documented h/o such. On high intensity atorvastatin as outpatient.   Plan: In the setting of current n.p.o. status, will hold home statin for now.                  #) Allergic Rhinitis: documented h/o such, on scheduled intranasal Flonase as outpatient.    Plan: cont home Flonase.                   #) Anemia of chronic disease: Documented history of such, a/w with baseline hgb range 10-12, with presenting hgb consistent with this range, in the absence of any overt evidence of active bleed.     Plan: Repeat CBC in the morning.  Check INR.  In the setting of anticipated surgery to her acute right hip fracture, will also pursue type and screen at this time.     DVT prophylaxis: SCD's   Code Status: DNR/DNI per patient and consistent with code status documentation during most recent prior hospitalization Family Communication: none Disposition Plan: Per Rounding Team Consults called: EDP d/w Dr. Charlann Boxer of [facility], who will formally consult, as further detailed above;  Admission status: Inpatient  I SPENT GREATER THAN 75  MINUTES IN CLINICAL CARE TIME/MEDICAL  DECISION-MAKING IN COMPLETING THIS ADMISSION.      Chaney Born Mikenzi Raysor DO [facility]  From 7PM - 7AM   10/28/2022, 10:35 PM

## 2022-10-29 ENCOUNTER — Inpatient Hospital Stay (HOSPITAL_COMMUNITY): Payer: Medicare Other | Admitting: Anesthesiology

## 2022-10-29 DIAGNOSIS — S72001D Fracture of unspecified part of neck of right femur, subsequent encounter for closed fracture with routine healing: Secondary | ICD-10-CM | POA: Diagnosis not present

## 2022-10-29 DIAGNOSIS — D638 Anemia in other chronic diseases classified elsewhere: Secondary | ICD-10-CM | POA: Diagnosis not present

## 2022-10-29 DIAGNOSIS — I10 Essential (primary) hypertension: Secondary | ICD-10-CM | POA: Diagnosis not present

## 2022-10-29 DIAGNOSIS — I5032 Chronic diastolic (congestive) heart failure: Secondary | ICD-10-CM | POA: Diagnosis not present

## 2022-10-29 LAB — COMPREHENSIVE METABOLIC PANEL
ALT: 14 U/L (ref 0–44)
AST: 19 U/L (ref 15–41)
Albumin: 2.6 g/dL — ABNORMAL LOW (ref 3.5–5.0)
Alkaline Phosphatase: 68 U/L (ref 38–126)
Anion gap: 14 (ref 5–15)
BUN: 23 mg/dL (ref 8–23)
CO2: 26 mmol/L (ref 22–32)
Calcium: 8.5 mg/dL — ABNORMAL LOW (ref 8.9–10.3)
Chloride: 101 mmol/L (ref 98–111)
Creatinine, Ser: 1.07 mg/dL — ABNORMAL HIGH (ref 0.44–1.00)
GFR, Estimated: 49 mL/min — ABNORMAL LOW (ref >60)
Glucose, Bld: 125 mg/dL — ABNORMAL HIGH (ref 70–99)
Potassium: 3.5 mmol/L (ref 3.5–5.1)
Sodium: 141 mmol/L (ref 135–145)
Total Bilirubin: 1 mg/dL (ref 0.3–1.2)
Total Protein: 6.1 g/dL — ABNORMAL LOW (ref 6.5–8.1)

## 2022-10-29 LAB — CBC WITH DIFFERENTIAL/PLATELET
Abs Immature Granulocytes: 0.12 10*3/uL — ABNORMAL HIGH (ref 0.00–0.07)
Basophils Absolute: 0 10*3/uL (ref 0.0–0.1)
Basophils Relative: 0 %
Eosinophils Absolute: 0 10*3/uL (ref 0.0–0.5)
Eosinophils Relative: 0 %
HCT: 33 % — ABNORMAL LOW (ref 36.0–46.0)
Hemoglobin: 9.7 g/dL — ABNORMAL LOW (ref 12.0–15.0)
Immature Granulocytes: 1 %
Lymphocytes Relative: 9 %
Lymphs Abs: 1.2 10*3/uL (ref 0.7–4.0)
MCH: 28.1 pg (ref 26.0–34.0)
MCHC: 29.4 g/dL — ABNORMAL LOW (ref 30.0–36.0)
MCV: 95.7 fL (ref 80.0–100.0)
Monocytes Absolute: 1.3 10*3/uL — ABNORMAL HIGH (ref 0.1–1.0)
Monocytes Relative: 10 %
Neutro Abs: 11.1 10*3/uL — ABNORMAL HIGH (ref 1.7–7.7)
Neutrophils Relative %: 80 %
Platelets: 268 10*3/uL (ref 150–400)
RBC: 3.45 MIL/uL — ABNORMAL LOW (ref 3.87–5.11)
RDW: 18.1 % — ABNORMAL HIGH (ref 11.5–15.5)
WBC: 13.9 10*3/uL — ABNORMAL HIGH (ref 4.0–10.5)
nRBC: 0 % (ref 0.0–0.2)

## 2022-10-29 LAB — MAGNESIUM
Magnesium: 1.5 mg/dL — ABNORMAL LOW (ref 1.7–2.4)
Magnesium: 1.5 mg/dL — ABNORMAL LOW (ref 1.7–2.4)

## 2022-10-29 LAB — VITAMIN D 25 HYDROXY (VIT D DEFICIENCY, FRACTURES): Vit D, 25-Hydroxy: 87.66 ng/mL (ref 30–100)

## 2022-10-29 LAB — PROTIME-INR
INR: 1.1 (ref 0.8–1.2)
Prothrombin Time: 14.8 s (ref 11.4–15.2)

## 2022-10-29 MED ORDER — DEXAMETHASONE SODIUM PHOSPHATE 4 MG/ML IJ SOLN
INTRAMUSCULAR | Status: DC | PRN
  Administered 2022-10-29: 10 mg via PERINEURAL

## 2022-10-29 MED ORDER — ROPIVACAINE HCL 5 MG/ML IJ SOLN
INTRAMUSCULAR | Status: DC | PRN
  Administered 2022-10-29: 20 mL via PERINEURAL

## 2022-10-29 MED ORDER — HYDROMORPHONE HCL 1 MG/ML IJ SOLN
0.5000 mg | INTRAMUSCULAR | Status: DC | PRN
  Administered 2022-10-29 – 2022-11-11 (×12): 0.5 mg via INTRAVENOUS
  Filled 2022-10-29 (×4): qty 0.5
  Filled 2022-10-29: qty 1
  Filled 2022-10-29 (×2): qty 0.5
  Filled 2022-10-29: qty 1
  Filled 2022-10-29 (×5): qty 0.5
  Filled 2022-10-29: qty 1
  Filled 2022-10-29: qty 0.5

## 2022-10-29 MED ORDER — ALUM & MAG HYDROXIDE-SIMETH 200-200-20 MG/5ML PO SUSP
30.0000 mL | ORAL | Status: DC | PRN
  Administered 2022-10-29: 30 mL via ORAL
  Filled 2022-10-29: qty 30

## 2022-10-29 MED ORDER — MAGNESIUM SULFATE 2 GM/50ML IV SOLN
2.0000 g | Freq: Once | INTRAVENOUS | Status: AC
  Administered 2022-10-29: 2 g via INTRAVENOUS
  Filled 2022-10-29: qty 50

## 2022-10-29 NOTE — Anesthesia Preprocedure Evaluation (Signed)
Anesthesia Evaluation  Patient identified by MRN, date of birth, ID band Patient awake    Reviewed: Allergy & Precautions, NPO status , Patient's Chart, lab work & pertinent test results  Airway Mallampati: II  TM Distance: >3 FB Neck ROM: Full    Dental  (+) Dental Advisory Given, Lower Dentures, Upper Dentures   Pulmonary sleep apnea , COPD,  oxygen dependent, former smoker   Pulmonary exam normal breath sounds clear to auscultation       Cardiovascular hypertension, + CAD and +CHF  Normal cardiovascular exam+ pacemaker  Rhythm:Regular Rate:Normal     Neuro/Psych TIA   GI/Hepatic Neg liver ROS,GERD  ,,  Endo/Other  negative endocrine ROS    Renal/GU Renal InsufficiencyRenal disease     Musculoskeletal Right femur fracture    Abdominal   Peds  Hematology  (+) Blood dyscrasia, anemia   Anesthesia Other Findings Day of surgery medications reviewed with the patient.  Reproductive/Obstetrics                              Anesthesia Physical Anesthesia Plan  ASA: 4  Anesthesia Plan: Regional   Post-op Pain Management: Regional block*   Induction: Intravenous  PONV Risk Score and Plan: 2 and Treatment may vary due to age or medical condition  Airway Management Planned: Nasal Cannula  Additional Equipment:   Intra-op Plan:   Post-operative Plan:   Informed Consent: I have reviewed the patients History and Physical, chart, labs and discussed the procedure including the risks, benefits and alternatives for the proposed anesthesia with the patient or authorized representative who has indicated his/her understanding and acceptance.     Dental advisory given  Plan Discussed with: CRNA  Anesthesia Plan Comments:          Anesthesia Quick Evaluation

## 2022-10-29 NOTE — Anesthesia Procedure Notes (Signed)
Anesthesia Regional Block: Femoral nerve block   Pre-Anesthetic Checklist: , timeout performed,  Correct Patient, Correct Site, Correct Laterality,  Correct Procedure, Correct Position, site marked,  Risks and benefits discussed,  Surgical consent,  Pre-op evaluation,  At surgeon's request and post-op pain management  Laterality: Right  Prep: chloraprep       Needles:  Injection technique: Single-shot  Needle Type: Echogenic Needle     Needle Length: 9cm  Needle Gauge: 21     Additional Needles:   Procedures:,,,, ultrasound used (permanent image in chart),,    Narrative:  Start time: 10/29/2022 11:42 AM End time: 10/29/2022 11:50 AM Injection made incrementally with aspirations every 5 mL.  Performed by: Personally  Anesthesiologist: Collene Schlichter, MD  Additional Notes: No pain on injection. No increased resistance to injection. Injection made in 5cc increments.  Good needle visualization.  Patient tolerated procedure well.

## 2022-10-29 NOTE — ED Notes (Signed)
NAD noted at this time, pt resting in bed, respirations even and unlabored, skin warm and dry, bed in low position, call light within reach. Comfort measures offered. Will continue to monitor.

## 2022-10-29 NOTE — Significant Event (Signed)
Rapid Response Event Note   Reason for Call :  "Second set of eyes"   Patient A&Ox4 sitting up in bed with daughter and MD at bedside. Lungs clear and heart tones normal. Transitioned patient from [place] to [place]. Patient wears 4L [place] baseline at home. Per MD [place] O2 goal >90%.   107/73 (83) HR 105 RR 18 O2 92% 6L [place]  Truddie Crumble, RN

## 2022-10-29 NOTE — Progress Notes (Signed)
[facility]                                                                               Felicia Benson, is a 89 y.o. female, DOB - 04/30/1930, MVH:[id number] Admit date - 10/28/2022    Outpatient Primary MD for the patient is [name], MD  LOS - 1  days    Brief summary    Felicia Benson is a 89 y.o. female with medical history significant for chronic hypoxic respiratory failure on baseline and 4 L continuous nasal cannula, COPD, chronic diastolic heart failure, essential pretension, hyperlipidemia anemia of chronic disease is a baseline hemoglobin range 10-12,  admitted to [facility] on 10/28/2022 with acute right hip fracture. Orthopedics consulted.     Assessment & Plan    Assessment and Plan:  Closed right hip fracture Orthopedics consulted and recommendations given.  Pain control.  Therapy evaluations tomorrow after surgery.    Chronic hypoxic respiratory failure:  Secondary to severe COPD and she is on 4 lit of [place] oxygen.  Continue with bronchodilators and [place] oxygen to keep sats greater than >90%   Chronic diastolic CHF;  BP parameters are borderline low.  Holding lasix.  Continue with strict intake and output.  Daily weights.    Hypertension:  BP borderline.    Anemia of chronic disease:  Baseline hemoglobin around 9.    Hypomagnesemia:  Replaced.    Leukocytosis:  Check UA.  CXR showing Emphysematous and chronic bronchitic changes in the lungs. No focal consolidation. Continue to monitor.      Estimated body mass index is 30.02 kg/m as calculated from the following:   Height as of this encounter: 5\' 6"  (1.676 m).   Weight as of this encounter: 84.4 kg.  Code Status: Dnr Limited.  DVT Prophylaxis:  SCDs Start: 10/28/22 2232   Level of Care: Level of care: Med-Surg Family Communication: none at bedside.   Disposition Plan:     Remains inpatient appropriate:  pending surgical repair   Procedures:  None.    Consultants:   Orthopedics.   Antimicrobials:   Anti-infectives (From admission, onward)    None        Medications  Scheduled Meds:  fluticasone  2 spray Each Nare Daily   fluticasone furoate-vilanterol  1 puff Inhalation Daily   umeclidinium bromide  1 puff Inhalation Daily   Continuous Infusions: PRN Meds:.acetaminophen **OR** acetaminophen, albuterol, alum & mag hydroxide-simeth, HYDROmorphone (DILAUDID) injection, melatonin, naLOXone (NARCAN)  injection, ondansetron (ZOFRAN) IV    Subjective:   Felicia Benson was seen and examined today.  Painful ROM. No sob or chest pain.   Objective:   Vitals:   10/29/22 1300 10/29/22 1302 10/29/22 1334 10/29/22 1344  BP: (!) 72/60 94/70  107/73  Pulse:  (!) 108    Resp:  18  18  Temp:  98.7 F (37.1 C)    TempSrc:  Axillary    SpO2: 90% 90% 90% 92%  Weight:      Height:        Intake/Output Summary (Last 24 hours) at 10/29/2022 1713 Last data filed at 10/28/2022 2232 Gross per 24 hour  Intake 0 ml  Output --  Net 0 ml   Filed Weights   10/28/22 1940  Weight: 84.4 kg     Exam General exam: Appears calm and comfortable  Respiratory system: Clear to auscultation. Respiratory effort normal. On 6 lit of [place] oxygen.   Cardiovascular system: S1 & S2 heard, RRR. No JVD,  Gastrointestinal system: Abdomen is nondistended, soft and nontender. Central nervous system: alert and oriented.  Extremities: painful ROM of the lower extremity.  Skin: No rashes, lesions or ulcers Psychiatry: Mood & affect appropriate.     Data Reviewed:  I have personally reviewed following labs and imaging studies   CBC Lab Results  Component Value Date   WBC 13.9 (H) 10/29/2022   RBC 3.45 (L) 10/29/2022   HGB 9.7 (L) 10/29/2022   HCT 33.0 (L) 10/29/2022   MCV 95.7 10/29/2022   MCH 28.1 10/29/2022   PLT 268 10/29/2022   MCHC 29.4 (L) 10/29/2022   RDW 18.1 (H) 10/29/2022   LYMPHSABS 1.2 10/29/2022   MONOABS 1.3 (H) 10/29/2022    EOSABS 0.0 10/29/2022   BASOSABS 0.0 10/29/2022     Last metabolic panel Lab Results  Component Value Date   NA 141 10/29/2022   K 3.5 10/29/2022   CL 101 10/29/2022   CO2 26 10/29/2022   BUN 23 10/29/2022   CREATININE 1.07 (H) 10/29/2022   GLUCOSE 125 (H) 10/29/2022   GFRNONAA 49 (L) 10/29/2022   GFRAA 44 (L) 05/21/2019   CALCIUM 8.5 (L) 10/29/2022   PHOS 3.2 05/21/2019   PROT 6.1 (L) 10/29/2022   ALBUMIN 2.6 (L) 10/29/2022   BILITOT 1.0 10/29/2022   ALKPHOS 68 10/29/2022   AST 19 10/29/2022   ALT 14 10/29/2022   ANIONGAP 14 10/29/2022    CBG (last 3)  No results for input(s): "GLUCAP" in the last 72 hours.    Coagulation Profile: Recent Labs  Lab 10/29/22 0254  INR 1.1     Radiology Studies: CT Cervical Spine Wo Contrast  Result Date: 10/28/2022 CLINICAL DATA:  Poly trauma, blunt.  Trip and fall injury. EXAM: CT CERVICAL SPINE WITHOUT CONTRAST TECHNIQUE: Multidetector CT imaging of the cervical spine was performed without intravenous contrast. Multiplanar CT image reconstructions were also generated. RADIATION DOSE REDUCTION: This exam was performed according to the departmental dose-optimization program which includes automated exposure control, adjustment of the mA and/or kV according to patient size and/or use of iterative reconstruction technique. COMPARISON:  CTA neck 12/31/2020 FINDINGS: Alignment: Normal alignment. Skull base and vertebrae: No acute fracture. No primary bone lesion or focal pathologic process. Soft tissues and spinal canal: No prevertebral fluid or swelling. No visible canal hematoma. Disc levels: Postoperative anterior plate and screw fixation and intervertebral fusion from C3 through C5. Fused segments appears intact. Surgical hardware appears intact. Degenerative changes at the other levels with disc space narrowing and endplate osteophyte formation. Degenerative changes in the facet joints. Upper chest: Mild interstitial changes in the lung  apices, possibly edema. Surgical absence of the left thyroid gland. Diffuse heterogeneous enlargement of the right thyroid and isthmus. No change since previous study. Appearance is likely indicate nodular goiter. Other: None. IMPRESSION: 1. Normal alignment.  No acute displaced fractures identified. 2. Postoperative anterior fixation and intervertebral fusion from C3 through C5. Fused segments appear intact. 3. Mild interstitial changes in the lung apices may indicate edema. 4. Stable heterogeneous enlargement of the right thyroid and isthmus likely indicating nodular goiter. This has been evaluated on previous imaging. (ref: J  Am Coll Radiol. 2015 Feb;12(2): 143-50). Electronically Signed   By: Burman Nieves M.D.   On: 10/28/2022 21:18   CT HEAD WO CONTRAST ( )  Result Date: 10/28/2022 CLINICAL DATA:  Poly trauma, blunt. Trip and fall injury during physical therapy. No loss of consciousness. EXAM: CT HEAD WITHOUT CONTRAST TECHNIQUE: Contiguous axial images were obtained from the base of the skull through the vertex without intravenous contrast. RADIATION DOSE REDUCTION: This exam was performed according to the departmental dose-optimization program which includes automated exposure control, adjustment of the mA and/or kV according to patient size and/or use of iterative reconstruction technique. COMPARISON:  MRI brain 01/11/2021.  CT head 12/31/2020 FINDINGS: Brain: Mild diffuse cerebral atrophy. Ventricular dilatation consistent with central atrophy. Low-attenuation changes in the deep white matter consistent with small vessel ischemia. No mass-effect or midline shift. No abnormal extra-axial fluid collections. Gray-white matter junctions are distinct. Basal cisterns are not effaced. No acute intracranial hemorrhage. There is an extra-axial soft tissue lesion in the left cavernous sinus and adjacent to the anterior margin of the left pons. This measures about 7 x 10 mm. No significant change in  appearance since prior studies. Most likely a small meningioma or possibly neurogenic tumor. Vascular: No hyperdense vessel or unexpected calcification. Skull: Normal. Negative for fracture or focal lesion. Sinuses/Orbits: No acute finding. Other: None. IMPRESSION: 1. No acute intracranial abnormalities. 2. Chronic atrophy and small vessel ischemic changes. 3. Extra-axial soft tissue lesion in the left cavernous sinus region, unchanged since prior studies. This is most likely a small meningioma or possibly neurogenic tumor. Electronically Signed   By: Burman Nieves M.D.   On: 10/28/2022 21:13   DG Knee Complete 4 Views Right  Result Date: 10/28/2022 CLINICAL DATA:  Pain after a fall. EXAM: RIGHT KNEE - COMPLETE 4+ VIEW COMPARISON:  MRI 05/01/2021 FINDINGS: Degenerative changes in the right knee with medial and lateral compartment narrowing and tricompartment osteophyte formation. No evidence of acute fracture or dislocation. No focal bone lesion or bone destruction. No significant effusions. Vascular calcifications. Soft tissue swelling over the infrapatellar region likely representing contusion. IMPRESSION: 1. No acute fracture or dislocation. 2. Moderate tricompartment degenerative changes. 3. Soft tissue swelling inferior to the patella likely representing soft tissue contusion. No effusion. Electronically Signed   By: Burman Nieves M.D.   On: 10/28/2022 21:06   DG Forearm Left  Result Date: 10/28/2022 CLINICAL DATA:  Pain after a fall. EXAM: LEFT FOREARM - 2 VIEW COMPARISON:  None Available. FINDINGS: Degenerative changes demonstrated in the radiocarpal and STT joints of the left wrist. Calcification of the triangular fibrocartilage. Radius and ulna appear intact. No acute displaced fractures identified. Soft tissues are unremarkable. IMPRESSION: Degenerative changes in the carpal bones. No acute bony abnormalities. Electronically Signed   By: Burman Nieves M.D.   On: 10/28/2022 21:05   DG  Chest Portable 1 View  Result Date: 10/28/2022 CLINICAL DATA:  Larey Seat during physical therapy. Pain to the right hip and left forearm. EXAM: PORTABLE CHEST 1 VIEW COMPARISON:  07/21/2022 FINDINGS: Cardiac pacemaker. Borderline heart size with normal pulmonary vascularity. Emphysematous changes and interstitial changes in the lungs likely indicating chronic bronchitis. Pleural effusions and consolidation seen previously have resolved. No pneumothorax. Mediastinal contours appear intact. Calcification of the aorta. Degenerative changes in the spine and shoulders. Postoperative changes in the cervical spine. IMPRESSION: Emphysematous and chronic bronchitic changes in the lungs. No focal consolidation. Electronically Signed   By: Burman Nieves M.D.   On: 10/28/2022 21:04  DG Hip Unilat With Pelvis 2-3 Views Right  Result Date: 10/28/2022 CLINICAL DATA:  pain EXAM: DG HIP (WITH OR WITHOUT PELVIS) 2-3V RIGHT COMPARISON:  None Available. FINDINGS: Acute displaced right intertrochanteric femoral fracture. No right hip dislocation. No acute displaced fracture or dislocation of the left hip on frontal view. No acute displaced fracture or diastasis of the bones of the pelvis. At least mild right and moderate left hip degenerative changes. No aggressive appearing focal bone abnormality. Visualized lower lumbar spine demonstrates degenerative changes. Atherosclerotic plaque. IMPRESSION: Acute displaced right intertrochanteric femoral fracture. Electronically Signed   By: Tish Frederickson M.D.   On: 10/28/2022 21:03       Kathlen Mody M.D. [facility] 10/29/2022, 5:13 PM  Available via Epic secure chat 7am-7pm After 7 pm, please refer to night coverage provider listed on amion.

## 2022-10-29 NOTE — ED Notes (Signed)
ED TO INPATIENT HANDOFF REPORT  ED Nurse Name and Phone #: Lilja Soland 77  S Name/Age/Gender Felicia Benson 89 y.o. female Room/Bed: 036C/036C  Code Status   Code Status: Limited: Do not attempt resuscitation (DNR) -DNR-LIMITED -Do Not Intubate/DNI   Home/SNF/Other Home Patient oriented to: self, place, time, and situation Is this baseline? Yes   Triage Complete: Triage complete  Chief Complaint Closed right hip fracture (HCC) [S72.001A]  Triage Note Pt BIBEMs from home s/p trip & fall during physical therapy. C/o Rt hip & knee pain,  Lt arm pain. Denies hitting head or LOC.   Allergies Allergies  Allergen Reactions   Ibuprofen     Other reaction(s): gi bleed   Ace Inhibitors     Other reaction(s): cough   Breztri Aerosphere [Budeson-Glycopyrrol-Formoterol] Hypertension   Ciprofloxacin     Other Reaction(s): avoid fluoroquinolones due to asc aortic aneurysm   Levofloxacin     Other Reaction(s): aneurysm   Nsaids Nausea And Vomiting    Pt has preference to tylonel    Level of Care/Admitting Diagnosis ED Disposition     ED Disposition  Admit   Condition  --   Comment  Hospital Area: [facility] [[id number]]  Level of Care: Med-Surg [16]  May admit patient to [facility] or [facility] if equivalent level of care is available:: No  Covid Evaluation: Asymptomatic - no recent exposure (last 10 days) testing not required  Diagnosis: Closed right hip fracture [facility]) [[id number]]  Admitting Physician: Angie Fava [[id number]]  Attending Physician: Angie Fava [[id number]]  Certification:: I certify this patient will need inpatient services for at least 2 midnights  Expected Medical Readiness: 10/30/2022          B Medical/Surgery History Past Medical History:  Diagnosis Date   AAA (abdominal aortic aneurysm) (HCC)    CHF (congestive heart failure) (HCC)    Crohn's disease (HCC)    Diverticulitis    Diverticulosis    GERD  (gastroesophageal reflux disease)    GI bleed    Hypertension    OSA (obstructive sleep apnea)    Pacemaker    Thyroid nodule    Past Surgical History:  Procedure Laterality Date   CHOLECYSTECTOMY     COLONOSCOPY WITH PROPOFOL N/A 06/01/2015   Procedure: COLONOSCOPY WITH PROPOFOL;  Surgeon: Charlott Rakes, MD;  Location: [facility] ENDOSCOPY;  Service: Endoscopy;  Laterality: N/A;   CORONARY ANGIOPLASTY WITH STENT PLACEMENT     ESOPHAGOGASTRODUODENOSCOPY (EGD) WITH PROPOFOL N/A 06/01/2015   Procedure: ESOPHAGOGASTRODUODENOSCOPY (EGD) WITH PROPOFOL;  Surgeon: Charlott Rakes, MD;  Location: [facility] ENDOSCOPY;  Service: Endoscopy;  Laterality: N/A;   GIVENS CAPSULE STUDY N/A 06/03/2015   Procedure: GIVENS CAPSULE STUDY;  Surgeon: Charlott Rakes, MD;  Location: [facility] ENDOSCOPY;  Service: Endoscopy;  Laterality: N/A;   PACEMAKER PLACEMENT  2015     A IV Location/Drains/Wounds Patient Lines/Drains/Airways Status     Active Line/Drains/Airways     Name Placement date Placement time Site Days   Peripheral IV 10/28/22 20 G Right Antecubital 10/28/22  2050  Antecubital  1   External Urinary Catheter 10/28/22  2322  --  1            Intake/Output Last 24 hours  Intake/Output Summary (Last 24 hours) at 10/29/2022 1223 Last data filed at 10/28/2022 2232 Gross per 24 hour  Intake 0 ml  Output --  Net 0 ml    Labs/Imaging Results for orders placed or performed during the hospital encounter  of 10/28/22 (from the past 48 hour(s))  CBC with Differential     Status: Abnormal   Collection Time: 10/28/22  7:19 PM  Result Value Ref Range   WBC 8.8 4.0 - 10.5 K/uL   RBC 3.77 (L) 3.87 - 5.11 MIL/uL   Hemoglobin 10.4 (L) 12.0 - 15.0 g/dL   HCT 95.2 (L) 84.1 - 32.4 %   MCV 91.2 80.0 - 100.0 fL   MCH 27.6 26.0 - 34.0 pg   MCHC 30.2 30.0 - 36.0 g/dL   RDW 40.1 (H) 02.7 - 25.3 %   Platelets 272 150 - 400 K/uL   nRBC 0.0 0.0 - 0.2 %   Neutrophils Relative % 62 %   Neutro Abs 5.4 1.7 - 7.7 K/uL    Lymphocytes Relative 25 %   Lymphs Abs 2.2 0.7 - 4.0 K/uL   Monocytes Relative 10 %   Monocytes Absolute 0.9 0.1 - 1.0 K/uL   Eosinophils Relative 2 %   Eosinophils Absolute 0.1 0.0 - 0.5 K/uL   Basophils Relative 0 %   Basophils Absolute 0.0 0.0 - 0.1 K/uL   Immature Granulocytes 1 %   Abs Immature Granulocytes 0.05 0.00 - 0.07 K/uL    Comment: Performed at [facility], [address], [place], [place] [address]  Basic metabolic panel     Status: Abnormal   Collection Time: 10/28/22  7:19 PM  Result Value Ref Range   Sodium 144 135 - 145 mmol/L   Potassium 3.7 3.5 - 5.1 mmol/L   Chloride 100 98 - 111 mmol/L   CO2 29 22 - 32 mmol/L   Glucose, Bld 154 (H) 70 - 99 mg/dL    Comment: Glucose reference range applies only to samples taken after fasting for at least 8 hours.   BUN 23 8 - 23 mg/dL   Creatinine, Ser 3.47 (H) 0.44 - 1.00 mg/dL   Calcium 8.7 (L) 8.9 - 10.3 mg/dL   GFR, Estimated 43 (L) >60 mL/min    Comment: (NOTE) Calculated using the CKD-EPI Creatinine Equation (2021)    Anion gap 15 5 - 15    Comment: Performed at [facility], [address], [place], [place] [address]  Type and screen [facility]     Status: None   Collection Time: 10/28/22 11:20 PM  Result Value Ref Range   ABO/RH(D) A POS    Antibody Screen NEG    Sample Expiration      10/31/2022,2359 Performed at [facility], [address] [place], [place] [address]   Magnesium     Status: Abnormal   Collection Time: 10/28/22 11:31 PM  Result Value Ref Range   Magnesium 1.5 (L) 1.7 - 2.4 mg/dL    Comment: Performed at [facility], [address] [place], [place] [address]  VITAMIN D 25 Hydroxy (Vit-D Deficiency, Fractures)     Status: None   Collection Time: 10/28/22 11:31 PM  Result Value Ref Range   Vit D, 25-Hydroxy 87.66 30 - 100 ng/mL    Comment: (NOTE) Vitamin D deficiency has been defined by the Institute of Medicine  and an Endocrine Society  practice guideline as a level of serum 25-OH  vitamin D less than 20 ng/mL (1,2). The Endocrine Society went on to  further define vitamin D insufficiency as a level between 21 and 29  ng/mL (2).  1. IOM (Institute of Medicine). 2010. Dietary reference intakes for  calcium and D.  [place]: [employer]. 2. Holick MF, Binkley [place], Bischoff-Ferrari HA, et al. Evaluation,  treatment, and prevention of vitamin D deficiency: an Endocrine  Society clinical practice guideline, JCEM. 2011 Jul; 96(7): 1911-30.  Performed at [facility], [address] [place], [place] [address]   CBC with Differential/Platelet     Status: Abnormal   Collection Time: 10/29/22  2:54 AM  Result Value Ref Range   WBC 13.9 (H) 4.0 - 10.5 K/uL   RBC 3.45 (L) 3.87 - 5.11 MIL/uL   Hemoglobin 9.7 (L) 12.0 - 15.0 g/dL   HCT 60.4 (L) 54.0 - 98.1 %   MCV 95.7 80.0 - 100.0 fL   MCH 28.1 26.0 - 34.0 pg   MCHC 29.4 (L) 30.0 - 36.0 g/dL   RDW 19.1 (H) 47.8 - 29.5 %   Platelets 268 150 - 400 K/uL   nRBC 0.0 0.0 - 0.2 %   Neutrophils Relative % 80 %   Neutro Abs 11.1 (H) 1.7 - 7.7 K/uL   Lymphocytes Relative 9 %   Lymphs Abs 1.2 0.7 - 4.0 K/uL   Monocytes Relative 10 %   Monocytes Absolute 1.3 (H) 0.1 - 1.0 K/uL   Eosinophils Relative 0 %   Eosinophils Absolute 0.0 0.0 - 0.5 K/uL   Basophils Relative 0 %   Basophils Absolute 0.0 0.0 - 0.1 K/uL   Immature Granulocytes 1 %   Abs Immature Granulocytes 0.12 (H) 0.00 - 0.07 K/uL    Comment: Performed at [facility], [address], [place], [place] [address]  Comprehensive metabolic panel     Status: Abnormal   Collection Time: 10/29/22  2:54 AM  Result Value Ref Range   Sodium 141 135 - 145 mmol/L   Potassium 3.5 3.5 - 5.1 mmol/L   Chloride 101 98 - 111 mmol/L   CO2 26 22 - 32 mmol/L   Glucose, Bld 125 (H) 70 - 99 mg/dL    Comment: Glucose reference range applies only to samples taken after fasting for at least 8 hours.   BUN 23 8  - 23 mg/dL   Creatinine, Ser 8.65 (H) 0.44 - 1.00 mg/dL   Calcium 8.5 (L) 8.9 - 10.3 mg/dL   Total Protein 6.1 (L) 6.5 - 8.1 g/dL   Albumin 2.6 (L) 3.5 - 5.0 g/dL   AST 19 15 - 41 U/L   ALT 14 0 - 44 U/L   Alkaline Phosphatase 68 38 - 126 U/L   Total Bilirubin 1.0 0.3 - 1.2 mg/dL   GFR, Estimated 49 (L) >60 mL/min    Comment: (NOTE) Calculated using the CKD-EPI Creatinine Equation (2021)    Anion gap 14 5 - 15    Comment: Performed at [facility], [address], [place], [place] [address]  Magnesium     Status: Abnormal   Collection Time: 10/29/22  2:54 AM  Result Value Ref Range   Magnesium 1.5 (L) 1.7 - 2.4 mg/dL    Comment: Performed at [facility], [address], [place], [place] [address]  Protime-INR     Status: None   Collection Time: 10/29/22  2:54 AM  Result Value Ref Range   Prothrombin Time 14.8 11.4 - 15.2 seconds   INR 1.1 0.8 - 1.2    Comment: (NOTE) INR goal varies based on device and disease states. Performed at [facility], [address], [place], [place] [address]    CT Cervical Spine  Wo Contrast  Result Date: 10/28/2022 CLINICAL DATA:  Poly trauma, blunt.  Trip and fall injury. EXAM: CT CERVICAL SPINE WITHOUT CONTRAST TECHNIQUE: Multidetector CT imaging of the cervical spine was performed without intravenous contrast. Multiplanar CT image reconstructions were also generated. RADIATION DOSE REDUCTION: This exam was performed according to the departmental dose-optimization program which includes automated exposure control, adjustment of the mA and/or kV according to patient size and/or use of iterative reconstruction technique. COMPARISON:  CTA neck 12/31/2020 FINDINGS: Alignment: Normal alignment. Skull base and vertebrae: No acute fracture. No primary bone lesion or focal pathologic process. Soft tissues and spinal canal: No prevertebral fluid or swelling. No visible canal hematoma. Disc levels: Postoperative anterior plate and screw  fixation and intervertebral fusion from C3 through C5. Fused segments appears intact. Surgical hardware appears intact. Degenerative changes at the other levels with disc space narrowing and endplate osteophyte formation. Degenerative changes in the facet joints. Upper chest: Mild interstitial changes in the lung apices, possibly edema. Surgical absence of the left thyroid gland. Diffuse heterogeneous enlargement of the right thyroid and isthmus. No change since previous study. Appearance is likely indicate nodular goiter. Other: None. IMPRESSION: 1. Normal alignment.  No acute displaced fractures identified. 2. Postoperative anterior fixation and intervertebral fusion from C3 through C5. Fused segments appear intact. 3. Mild interstitial changes in the lung apices may indicate edema. 4. Stable heterogeneous enlargement of the right thyroid and isthmus likely indicating nodular goiter. This has been evaluated on previous imaging. (ref: J Am Coll Radiol. 2015 Feb;12(2): 143-50). Electronically Signed   By: Burman Nieves M.D.   On: 10/28/2022 21:18   CT HEAD WO CONTRAST ( )  Result Date: 10/28/2022 CLINICAL DATA:  Poly trauma, blunt. Trip and fall injury during physical therapy. No loss of consciousness. EXAM: CT HEAD WITHOUT CONTRAST TECHNIQUE: Contiguous axial images were obtained from the base of the skull through the vertex without intravenous contrast. RADIATION DOSE REDUCTION: This exam was performed according to the departmental dose-optimization program which includes automated exposure control, adjustment of the mA and/or kV according to patient size and/or use of iterative reconstruction technique. COMPARISON:  MRI brain 01/11/2021.  CT head 12/31/2020 FINDINGS: Brain: Mild diffuse cerebral atrophy. Ventricular dilatation consistent with central atrophy. Low-attenuation changes in the deep white matter consistent with small vessel ischemia. No mass-effect or midline shift. No abnormal extra-axial  fluid collections. Gray-white matter junctions are distinct. Basal cisterns are not effaced. No acute intracranial hemorrhage. There is an extra-axial soft tissue lesion in the left cavernous sinus and adjacent to the anterior margin of the left pons. This measures about 7 x 10 mm. No significant change in appearance since prior studies. Most likely a small meningioma or possibly neurogenic tumor. Vascular: No hyperdense vessel or unexpected calcification. Skull: Normal. Negative for fracture or focal lesion. Sinuses/Orbits: No acute finding. Other: None. IMPRESSION: 1. No acute intracranial abnormalities. 2. Chronic atrophy and small vessel ischemic changes. 3. Extra-axial soft tissue lesion in the left cavernous sinus region, unchanged since prior studies. This is most likely a small meningioma or possibly neurogenic tumor. Electronically Signed   By: Burman Nieves M.D.   On: 10/28/2022 21:13   DG Knee Complete 4 Views Right  Result Date: 10/28/2022 CLINICAL DATA:  Pain after a fall. EXAM: RIGHT KNEE - COMPLETE 4+ VIEW COMPARISON:  MRI 05/01/2021 FINDINGS: Degenerative changes in the right knee with medial and lateral compartment narrowing and tricompartment osteophyte formation. No evidence of acute fracture or dislocation. No focal bone lesion  or bone destruction. No significant effusions. Vascular calcifications. Soft tissue swelling over the infrapatellar region likely representing contusion. IMPRESSION: 1. No acute fracture or dislocation. 2. Moderate tricompartment degenerative changes. 3. Soft tissue swelling inferior to the patella likely representing soft tissue contusion. No effusion. Electronically Signed   By: Burman Nieves M.D.   On: 10/28/2022 21:06   DG Forearm Left  Result Date: 10/28/2022 CLINICAL DATA:  Pain after a fall. EXAM: LEFT FOREARM - 2 VIEW COMPARISON:  None Available. FINDINGS: Degenerative changes demonstrated in the radiocarpal and STT joints of the left wrist.  Calcification of the triangular fibrocartilage. Radius and ulna appear intact. No acute displaced fractures identified. Soft tissues are unremarkable. IMPRESSION: Degenerative changes in the carpal bones. No acute bony abnormalities. Electronically Signed   By: Burman Nieves M.D.   On: 10/28/2022 21:05   DG Chest Portable 1 View  Result Date: 10/28/2022 CLINICAL DATA:  Larey Seat during physical therapy. Pain to the right hip and left forearm. EXAM: PORTABLE CHEST 1 VIEW COMPARISON:  07/21/2022 FINDINGS: Cardiac pacemaker. Borderline heart size with normal pulmonary vascularity. Emphysematous changes and interstitial changes in the lungs likely indicating chronic bronchitis. Pleural effusions and consolidation seen previously have resolved. No pneumothorax. Mediastinal contours appear intact. Calcification of the aorta. Degenerative changes in the spine and shoulders. Postoperative changes in the cervical spine. IMPRESSION: Emphysematous and chronic bronchitic changes in the lungs. No focal consolidation. Electronically Signed   By: Burman Nieves M.D.   On: 10/28/2022 21:04   DG Hip Unilat With Pelvis 2-3 Views Right  Result Date: 10/28/2022 CLINICAL DATA:  pain EXAM: DG HIP (WITH OR WITHOUT PELVIS) 2-3V RIGHT COMPARISON:  None Available. FINDINGS: Acute displaced right intertrochanteric femoral fracture. No right hip dislocation. No acute displaced fracture or dislocation of the left hip on frontal view. No acute displaced fracture or diastasis of the bones of the pelvis. At least mild right and moderate left hip degenerative changes. No aggressive appearing focal bone abnormality. Visualized lower lumbar spine demonstrates degenerative changes. Atherosclerotic plaque. IMPRESSION: Acute displaced right intertrochanteric femoral fracture. Electronically Signed   By: Tish Frederickson M.D.   On: 10/28/2022 21:03    Pending Labs Unresulted Labs (From admission, onward)     Start     Ordered   10/28/22  1915  Urinalysis, Routine w reflex microscopic -Urine, Clean Catch  Once,   URGENT       Question:  Specimen Source  Answer:  Urine, Clean Catch   10/28/22 1915            Vitals/Pain Today's Vitals   10/29/22 1114 10/29/22 1122 10/29/22 1145 10/29/22 1200  BP: 93/61 92/63 100/65 (!) 88/60  Pulse: (!) 109 (!) 115 (!) 114 (!) 110  Resp: 13 (!) 21 17 18   Temp: 97.8 F (36.6 C) 99 F (37.2 C)  99.3 F (37.4 C)  TempSrc: Axillary     SpO2: (!) 86% 90% 90% 97%  Weight:      Height:      PainSc:  10-Worst pain ever  8     Isolation Precautions No active isolations  Medications Medications  acetaminophen (TYLENOL) tablet 650 mg (has no administration in time range)    Or  acetaminophen (TYLENOL) suppository 650 mg (has no administration in time range)  melatonin tablet 3 mg (has no administration in time range)  ondansetron (ZOFRAN) injection 4 mg (4 mg Intravenous Given 10/29/22 0823)  naloxone (NARCAN) injection 0.4 mg (has no administration in time range)  fluticasone (FLONASE) 50 MCG/ACT nasal spray 2 spray (has no administration in time range)  albuterol (PROVENTIL) (2.5 MG/3ML) 0.083% nebulizer solution 2.5 mg (has no administration in time range)  fluticasone furoate-vilanterol (BREO ELLIPTA) 200-25 MCG/ACT 1 puff (1 puff Inhalation Not Given 10/29/22 1143)  umeclidinium bromide (INCRUSE ELLIPTA) 62.5 MCG/ACT 1 puff (1 puff Inhalation Not Given 10/29/22 1144)  HYDROmorphone (DILAUDID) injection 0.5 mg (0.5 mg Intravenous Given 10/29/22 0824)  fentaNYL (SUBLIMAZE) injection 50 mcg (50 mcg Intravenous Given 10/28/22 2050)    Mobility walks with device     Focused Assessments     R Recommendations: See Admitting Provider Note  Report given to:   Additional Notes:  Just returned from having nerve block in PACU

## 2022-10-29 NOTE — Consult Note (Addendum)
Reason for Consult:Right hip fx Referring Physician: Kathlen Mody Time called: [id number] Time at bedside: 0941   Felicia Benson is an 89 y.o. female.  HPI: Zeniah was doing her morning exercises when her foot got caught on the rug and she fell. She had immediate right hip pain and could not get up. She was brought to the ED where x-rays showed a right hip fx and orthopedic surgery was consulted. She lives at home with her daughter and does not use any assistive devices to ambulate.  Past Medical History:  Diagnosis Date   AAA (abdominal aortic aneurysm) (HCC)    CHF (congestive heart failure) (HCC)    Crohn's disease (HCC)    Diverticulitis    Diverticulosis    GERD (gastroesophageal reflux disease)    GI bleed    Hypertension    OSA (obstructive sleep apnea)    Pacemaker    Thyroid nodule     Past Surgical History:  Procedure Laterality Date   CHOLECYSTECTOMY     COLONOSCOPY WITH PROPOFOL N/A 06/01/2015   Procedure: COLONOSCOPY WITH PROPOFOL;  Surgeon: Charlott Rakes, MD;  Location: [facility] ENDOSCOPY;  Service: Endoscopy;  Laterality: N/A;   CORONARY ANGIOPLASTY WITH STENT PLACEMENT     ESOPHAGOGASTRODUODENOSCOPY (EGD) WITH PROPOFOL N/A 06/01/2015   Procedure: ESOPHAGOGASTRODUODENOSCOPY (EGD) WITH PROPOFOL;  Surgeon: Charlott Rakes, MD;  Location: [facility] ENDOSCOPY;  Service: Endoscopy;  Laterality: N/A;   GIVENS CAPSULE STUDY N/A 06/03/2015   Procedure: GIVENS CAPSULE STUDY;  Surgeon: Charlott Rakes, MD;  Location: [facility] ENDOSCOPY;  Service: Endoscopy;  Laterality: N/A;   PACEMAKER PLACEMENT  2015    Family History  Problem Relation Age of Onset   CVA Mother    Lung cancer Father    Colon cancer Neg Hx     Social History:  reports that she has quit smoking. She has never used smokeless tobacco. She reports that she does not drink alcohol and does not use drugs.  Allergies:  Allergies  Allergen Reactions   Ibuprofen     Other reaction(s): gi bleed   Ace Inhibitors      Other reaction(s): cough   Breztri Aerosphere [Budeson-Glycopyrrol-Formoterol] Hypertension   Ciprofloxacin     Other Reaction(s): avoid fluoroquinolones due to asc aortic aneurysm   Levofloxacin     Other Reaction(s): aneurysm   Nsaids Nausea And Vomiting    Pt has preference to tylonel    Medications: I have reviewed the patient's current medications.  Results for orders placed or performed during the hospital encounter of 10/28/22 (from the past 48 hour(s))  CBC with Differential     Status: Abnormal   Collection Time: 10/28/22  7:19 PM  Result Value Ref Range   WBC 8.8 4.0 - 10.5 K/uL   RBC 3.77 (L) 3.87 - 5.11 MIL/uL   Hemoglobin 10.4 (L) 12.0 - 15.0 g/dL   HCT 62.1 (L) 30.8 - 65.7 %   MCV 91.2 80.0 - 100.0 fL   MCH 27.6 26.0 - 34.0 pg   MCHC 30.2 30.0 - 36.0 g/dL   RDW 84.6 (H) 96.2 - 95.2 %   Platelets 272 150 - 400 K/uL   nRBC 0.0 0.0 - 0.2 %   Neutrophils Relative % 62 %   Neutro Abs 5.4 1.7 - 7.7 K/uL   Lymphocytes Relative 25 %   Lymphs Abs 2.2 0.7 - 4.0 K/uL   Monocytes Relative 10 %   Monocytes Absolute 0.9 0.1 - 1.0 K/uL   Eosinophils Relative 2 %  Eosinophils Absolute 0.1 0.0 - 0.5 K/uL   Basophils Relative 0 %   Basophils Absolute 0.0 0.0 - 0.1 K/uL   Immature Granulocytes 1 %   Abs Immature Granulocytes 0.05 0.00 - 0.07 K/uL    Comment: Performed at [facility], [address], [place], [place] [address]  Basic metabolic panel     Status: Abnormal   Collection Time: 10/28/22  7:19 PM  Result Value Ref Range   Sodium 144 135 - 145 mmol/L   Potassium 3.7 3.5 - 5.1 mmol/L   Chloride 100 98 - 111 mmol/L   CO2 29 22 - 32 mmol/L   Glucose, Bld 154 (H) 70 - 99 mg/dL    Comment: Glucose reference range applies only to samples taken after fasting for at least 8 hours.   BUN 23 8 - 23 mg/dL   Creatinine, Ser 1.32 (H) 0.44 - 1.00 mg/dL   Calcium 8.7 (L) 8.9 - 10.3 mg/dL   GFR, Estimated 43 (L) >60 mL/min    Comment: (NOTE) Calculated using the  CKD-EPI Creatinine Equation (2021)    Anion gap 15 5 - 15    Comment: Performed at [facility], [address], [place], [place] [address]  Type and screen [facility]     Status: None   Collection Time: 10/28/22 11:20 PM  Result Value Ref Range   ABO/RH(D) A POS    Antibody Screen NEG    Sample Expiration      10/31/2022,2359 Performed at [facility], [address], [place], [place] [address]   Magnesium     Status: Abnormal   Collection Time: 10/28/22 11:31 PM  Result Value Ref Range   Magnesium 1.5 (L) 1.7 - 2.4 mg/dL    Comment: Performed at [facility], [address], [place], [place] [address]  VITAMIN D 25 Hydroxy (Vit-D Deficiency, Fractures)     Status: None   Collection Time: 10/28/22 11:31 PM  Result Value Ref Range   Vit D, 25-Hydroxy 87.66 30 - 100 ng/mL    Comment: (NOTE) Vitamin D deficiency has been defined by the Institute of Medicine  and an Endocrine Society practice guideline as a level of serum 25-OH  vitamin D less than 20 ng/mL (1,2). The Endocrine Society went on to  further define vitamin D insufficiency as a level between 21 and 29  ng/mL (2).  1. IOM (Institute of Medicine). 2010. Dietary reference intakes for  calcium and D. [place]: [employer]. 2. Holick MF, Binkley [place], Bischoff-Ferrari HA, et al. Evaluation,  treatment, and prevention of vitamin D deficiency: an Endocrine  Society clinical practice guideline, JCEM. 2011 Jul; 96(7): 1911-30.  Performed at [facility], [address] [place], [place] [address]   CBC with Differential/Platelet     Status: Abnormal   Collection Time: 10/29/22  2:54 AM  Result Value Ref Range   WBC 13.9 (H) 4.0 - 10.5 K/uL   RBC 3.45 (L) 3.87 - 5.11 MIL/uL   Hemoglobin 9.7 (L) 12.0 - 15.0 g/dL   HCT 59.5 (L) 63.8 - 75.6 %   MCV 95.7 80.0 - 100.0 fL   MCH 28.1 26.0 - 34.0 pg   MCHC 29.4 (L) 30.0 - 36.0 g/dL   RDW 43.3 (H) 29.5 - 18.8 %    Platelets 268 150 - 400 K/uL   nRBC 0.0 0.0 - 0.2 %   Neutrophils Relative % 80 %  Neutro Abs 11.1 (H) 1.7 - 7.7 K/uL   Lymphocytes Relative 9 %   Lymphs Abs 1.2 0.7 - 4.0 K/uL   Monocytes Relative 10 %   Monocytes Absolute 1.3 (H) 0.1 - 1.0 K/uL   Eosinophils Relative 0 %   Eosinophils Absolute 0.0 0.0 - 0.5 K/uL   Basophils Relative 0 %   Basophils Absolute 0.0 0.0 - 0.1 K/uL   Immature Granulocytes 1 %   Abs Immature Granulocytes 0.12 (H) 0.00 - 0.07 K/uL    Comment: Performed at [facility], [address], [place], [place] [address]  Comprehensive metabolic panel     Status: Abnormal   Collection Time: 10/29/22  2:54 AM  Result Value Ref Range   Sodium 141 135 - 145 mmol/L   Potassium 3.5 3.5 - 5.1 mmol/L   Chloride 101 98 - 111 mmol/L   CO2 26 22 - 32 mmol/L   Glucose, Bld 125 (H) 70 - 99 mg/dL    Comment: Glucose reference range applies only to samples taken after fasting for at least 8 hours.   BUN 23 8 - 23 mg/dL   Creatinine, Ser 2.70 (H) 0.44 - 1.00 mg/dL   Calcium 8.5 (L) 8.9 - 10.3 mg/dL   Total Protein 6.1 (L) 6.5 - 8.1 g/dL   Albumin 2.6 (L) 3.5 - 5.0 g/dL   AST 19 15 - 41 U/L   ALT 14 0 - 44 U/L   Alkaline Phosphatase 68 38 - 126 U/L   Total Bilirubin 1.0 0.3 - 1.2 mg/dL   GFR, Estimated 49 (L) >60 mL/min    Comment: (NOTE) Calculated using the CKD-EPI Creatinine Equation (2021)    Anion gap 14 5 - 15    Comment: Performed at [facility], [address] [place], [place] [address]  Magnesium     Status: Abnormal   Collection Time: 10/29/22  2:54 AM  Result Value Ref Range   Magnesium 1.5 (L) 1.7 - 2.4 mg/dL    Comment: Performed at [facility], [address], [place], [place] [address]  Protime-INR     Status: None   Collection Time: 10/29/22  2:54 AM  Result Value Ref Range   Prothrombin Time 14.8 11.4 - 15.2 seconds   INR 1.1 0.8 - 1.2    Comment: (NOTE) INR goal varies based on device and disease states. Performed at [facility], [address] [place], [place] [address]     CT Cervical Spine Wo Contrast  Result Date: 10/28/2022 CLINICAL DATA:  Poly trauma, blunt.  Trip and fall injury. EXAM: CT CERVICAL SPINE WITHOUT CONTRAST TECHNIQUE: Multidetector CT imaging of the cervical spine was performed without intravenous contrast. Multiplanar CT image reconstructions were also generated. RADIATION DOSE REDUCTION: This exam was performed according to the departmental dose-optimization program which includes automated exposure control, adjustment of the mA and/or kV according to patient size and/or use of iterative reconstruction technique. COMPARISON:  CTA neck 12/31/2020 FINDINGS: Alignment: Normal alignment. Skull base and vertebrae: No acute fracture. No primary bone lesion or focal pathologic process. Soft tissues and spinal canal: No prevertebral fluid or swelling. No visible canal hematoma. Disc levels: Postoperative anterior plate and screw fixation and intervertebral fusion from C3 through C5. Fused segments appears intact. Surgical hardware appears intact. Degenerative changes at the other levels with disc space narrowing and endplate osteophyte formation. Degenerative changes in the facet joints. Upper chest: Mild interstitial changes in the lung apices, possibly  edema. Surgical absence of the left thyroid gland. Diffuse heterogeneous enlargement of the right thyroid and isthmus. No change since previous study. Appearance is likely indicate nodular goiter. Other: None. IMPRESSION: 1. Normal alignment.  No acute displaced fractures identified. 2. Postoperative anterior fixation and intervertebral fusion from C3 through C5. Fused segments appear intact. 3. Mild interstitial changes in the lung apices may indicate edema. 4. Stable heterogeneous enlargement of the right thyroid and isthmus likely indicating nodular goiter. This has been evaluated on previous imaging. (ref: J Am Coll Radiol. 2015 Feb;12(2): 143-50).  Electronically Signed   By: Burman Nieves M.D.   On: 10/28/2022 21:18   CT HEAD WO CONTRAST ( )  Result Date: 10/28/2022 CLINICAL DATA:  Poly trauma, blunt. Trip and fall injury during physical therapy. No loss of consciousness. EXAM: CT HEAD WITHOUT CONTRAST TECHNIQUE: Contiguous axial images were obtained from the base of the skull through the vertex without intravenous contrast. RADIATION DOSE REDUCTION: This exam was performed according to the departmental dose-optimization program which includes automated exposure control, adjustment of the mA and/or kV according to patient size and/or use of iterative reconstruction technique. COMPARISON:  MRI brain 01/11/2021.  CT head 12/31/2020 FINDINGS: Brain: Mild diffuse cerebral atrophy. Ventricular dilatation consistent with central atrophy. Low-attenuation changes in the deep white matter consistent with small vessel ischemia. No mass-effect or midline shift. No abnormal extra-axial fluid collections. Gray-white matter junctions are distinct. Basal cisterns are not effaced. No acute intracranial hemorrhage. There is an extra-axial soft tissue lesion in the left cavernous sinus and adjacent to the anterior margin of the left pons. This measures about 7 x 10 mm. No significant change in appearance since prior studies. Most likely a small meningioma or possibly neurogenic tumor. Vascular: No hyperdense vessel or unexpected calcification. Skull: Normal. Negative for fracture or focal lesion. Sinuses/Orbits: No acute finding. Other: None. IMPRESSION: 1. No acute intracranial abnormalities. 2. Chronic atrophy and small vessel ischemic changes. 3. Extra-axial soft tissue lesion in the left cavernous sinus region, unchanged since prior studies. This is most likely a small meningioma or possibly neurogenic tumor. Electronically Signed   By: Burman Nieves M.D.   On: 10/28/2022 21:13   DG Knee Complete 4 Views Right  Result Date: 10/28/2022 CLINICAL DATA:  Pain  after a fall. EXAM: RIGHT KNEE - COMPLETE 4+ VIEW COMPARISON:  MRI 05/01/2021 FINDINGS: Degenerative changes in the right knee with medial and lateral compartment narrowing and tricompartment osteophyte formation. No evidence of acute fracture or dislocation. No focal bone lesion or bone destruction. No significant effusions. Vascular calcifications. Soft tissue swelling over the infrapatellar region likely representing contusion. IMPRESSION: 1. No acute fracture or dislocation. 2. Moderate tricompartment degenerative changes. 3. Soft tissue swelling inferior to the patella likely representing soft tissue contusion. No effusion. Electronically Signed   By: Burman Nieves M.D.   On: 10/28/2022 21:06   DG Forearm Left  Result Date: 10/28/2022 CLINICAL DATA:  Pain after a fall. EXAM: LEFT FOREARM - 2 VIEW COMPARISON:  None Available. FINDINGS: Degenerative changes demonstrated in the radiocarpal and STT joints of the left wrist. Calcification of the triangular fibrocartilage. Radius and ulna appear intact. No acute displaced fractures identified. Soft tissues are unremarkable. IMPRESSION: Degenerative changes in the carpal bones. No acute bony abnormalities. Electronically Signed   By: Burman Nieves M.D.   On: 10/28/2022 21:05   DG Chest Portable 1 View  Result Date: 10/28/2022 CLINICAL DATA:  Larey Seat during physical therapy. Pain to the right hip and left  forearm. EXAM: PORTABLE CHEST 1 VIEW COMPARISON:  07/21/2022 FINDINGS: Cardiac pacemaker. Borderline heart size with normal pulmonary vascularity. Emphysematous changes and interstitial changes in the lungs likely indicating chronic bronchitis. Pleural effusions and consolidation seen previously have resolved. No pneumothorax. Mediastinal contours appear intact. Calcification of the aorta. Degenerative changes in the spine and shoulders. Postoperative changes in the cervical spine. IMPRESSION: Emphysematous and chronic bronchitic changes in the lungs. No  focal consolidation. Electronically Signed   By: Burman Nieves M.D.   On: 10/28/2022 21:04   DG Hip Unilat With Pelvis 2-3 Views Right  Result Date: 10/28/2022 CLINICAL DATA:  pain EXAM: DG HIP (WITH OR WITHOUT PELVIS) 2-3V RIGHT COMPARISON:  None Available. FINDINGS: Acute displaced right intertrochanteric femoral fracture. No right hip dislocation. No acute displaced fracture or dislocation of the left hip on frontal view. No acute displaced fracture or diastasis of the bones of the pelvis. At least mild right and moderate left hip degenerative changes. No aggressive appearing focal bone abnormality. Visualized lower lumbar spine demonstrates degenerative changes. Atherosclerotic plaque. IMPRESSION: Acute displaced right intertrochanteric femoral fracture. Electronically Signed   By: Tish Frederickson M.D.   On: 10/28/2022 21:03    Review of Systems  HENT:  Negative for ear discharge, ear pain, hearing loss and tinnitus.   Eyes:  Negative for photophobia and pain.  Respiratory:  Negative for cough and shortness of breath.   Cardiovascular:  Negative for chest pain.  Gastrointestinal:  Negative for abdominal pain, nausea and vomiting.  Genitourinary:  Negative for dysuria, flank pain, frequency and urgency.  Musculoskeletal:  Positive for arthralgias (Right hip). Negative for back pain, myalgias and neck pain.  Neurological:  Negative for dizziness and headaches.  Hematological:  Does not bruise/bleed easily.  Psychiatric/Behavioral:  The patient is not nervous/anxious.    Blood pressure 128/80, pulse (!) 113, temperature (!) 97.5 F (36.4 C), temperature source Oral, resp. rate (!) 30, height 5\' 6"  (1.676 m), weight 84.4 kg, SpO2 90%. Physical Exam Constitutional:      General: She is not in acute distress.    Appearance: She is well-developed. She is not diaphoretic.  HENT:     Head: Normocephalic and atraumatic.  Eyes:     General: No scleral icterus.       Right eye: No discharge.         Left eye: No discharge.     Conjunctiva/sclera: Conjunctivae normal.  Cardiovascular:     Rate and Rhythm: Normal rate and regular rhythm.  Pulmonary:     Effort: Pulmonary effort is normal. No respiratory distress.  Musculoskeletal:     Cervical back: Normal range of motion.     Comments: RLE No traumatic wounds, ecchymosis, or rash  Mod TTP hip  No knee or ankle effusion  Knee stable to varus/ valgus and anterior/posterior stress  Sens DPN, SPN, TN intact  Motor EHL, ext, flex, evers 5/5  DP 1+, PT 0, 1+ NP edema  Skin:    General: Skin is warm and dry.  Neurological:     Mental Status: She is alert.  Psychiatric:        Mood and Affect: Mood normal.        Behavior: Behavior normal.     Assessment/Plan: Right hip fx -- Plan IMN, surgeon and timing TBD. May give diet today. Tentatively plan tomorrow afternoon with Dr. Blanchie Dessert. Please keep NPO after MN. Multiple medical problems including chronic hypoxic respiratory failure on baseline and 4 L continuous nasal cannula,  COPD, chronic diastolic heart failure, essential pretension, hyperlipidemia, and  anemia of chronic disease is a baseline hemoglobin range 10-12 -- per primary service    Freeman Caldron, PA-C Orthopedic Surgery [phone] 10/29/2022, 9:48 AM

## 2022-10-29 NOTE — ED Notes (Signed)
Transferred to PACU for nerve block

## 2022-10-29 NOTE — TOC CAGE-AID Note (Signed)
Transition of Care [facility] [place] [facility]) - CAGE-AID Screening   Patient Details  Name: Felicia Benson MRN: [id number] Date of Birth: 22-Aug-1930  Clinical Narrative:  Patient denies any current alcohol or drug use, no need to provide substance abuse resources at this time.  CAGE-AID Screening:    Have You Ever Felt You Ought to Cut Down on Your Drinking or Drug Use?: No Have People Annoyed You By Critizing Your Drinking Or Drug Use?: No Have You Felt Bad Or Guilty About Your Drinking Or Drug Use?: No Have You Ever Had a Drink or Used Drugs First Thing In The Morning to Steady Your Nerves or to Get Rid of a Hangover?: No CAGE-AID Score: 0  Substance Abuse Education Offered: No

## 2022-10-29 NOTE — Progress Notes (Signed)
   10/29/22 1302  Assess: MEWS Score  Temp 98.7 F (37.1 C)  BP 94/70  MAP (mmHg) 79  Pulse Rate (!) 108  Resp 18  SpO2 90 %  O2 Device Simple Mask  O2 Flow Rate (L/min) 8 L/min  Assess: MEWS Score  MEWS Temp 0  MEWS Systolic 1  MEWS Pulse 1  MEWS RR 0  MEWS LOC 0  MEWS Score 2  MEWS Score Color Yellow  Assess: if the MEWS score is Yellow or Red  Were vital signs accurate and taken at a resting state? Yes  MEWS guidelines implemented  No, previously yellow, continue vital signs every 4 hours  Assess: SIRS CRITERIA  SIRS Temperature  0  SIRS Pulse 1  SIRS Respirations  0  SIRS WBC 1  SIRS Score Sum  2

## 2022-10-30 ENCOUNTER — Other Ambulatory Visit: Payer: Self-pay

## 2022-10-30 ENCOUNTER — Encounter (HOSPITAL_COMMUNITY): Payer: Self-pay | Admitting: Internal Medicine

## 2022-10-30 ENCOUNTER — Inpatient Hospital Stay (HOSPITAL_COMMUNITY): Payer: Self-pay | Admitting: Anesthesiology

## 2022-10-30 ENCOUNTER — Encounter (HOSPITAL_COMMUNITY)
Admission: EM | Disposition: A | Discharge status: Skilled Nursing Facility | Payer: Self-pay | Source: Home / Self Care | Attending: Internal Medicine

## 2022-10-30 ENCOUNTER — Inpatient Hospital Stay (HOSPITAL_COMMUNITY): Payer: Medicare Other | Admitting: Anesthesiology

## 2022-10-30 ENCOUNTER — Inpatient Hospital Stay (HOSPITAL_COMMUNITY): Payer: Medicare Other

## 2022-10-30 DIAGNOSIS — I2510 Atherosclerotic heart disease of native coronary artery without angina pectoris: Secondary | ICD-10-CM | POA: Diagnosis not present

## 2022-10-30 DIAGNOSIS — S72001A Fracture of unspecified part of neck of right femur, initial encounter for closed fracture: Secondary | ICD-10-CM | POA: Diagnosis not present

## 2022-10-30 HISTORY — PX: INTRAMEDULLARY (IM) NAIL INTERTROCHANTERIC: SHX5875

## 2022-10-30 LAB — CBC WITH DIFFERENTIAL/PLATELET
Abs Immature Granulocytes: 0.1 10*3/uL — ABNORMAL HIGH (ref 0.00–0.07)
Basophils Absolute: 0 10*3/uL (ref 0.0–0.1)
Basophils Relative: 0 %
Eosinophils Absolute: 0 10*3/uL (ref 0.0–0.5)
Eosinophils Relative: 0 %
HCT: 25.3 % — ABNORMAL LOW (ref 36.0–46.0)
Hemoglobin: 7.9 g/dL — ABNORMAL LOW (ref 12.0–15.0)
Immature Granulocytes: 1 %
Lymphocytes Relative: 6 %
Lymphs Abs: 0.9 10*3/uL (ref 0.7–4.0)
MCH: 27.9 pg (ref 26.0–34.0)
MCHC: 31.2 g/dL (ref 30.0–36.0)
MCV: 89.4 fL (ref 80.0–100.0)
Monocytes Absolute: 1.5 10*3/uL — ABNORMAL HIGH (ref 0.1–1.0)
Monocytes Relative: 10 %
Neutro Abs: 12.5 10*3/uL — ABNORMAL HIGH (ref 1.7–7.7)
Neutrophils Relative %: 83 %
Platelets: 187 10*3/uL (ref 150–400)
RBC: 2.83 MIL/uL — ABNORMAL LOW (ref 3.87–5.11)
RDW: 17.8 % — ABNORMAL HIGH (ref 11.5–15.5)
WBC: 15 10*3/uL — ABNORMAL HIGH (ref 4.0–10.5)
nRBC: 0 % (ref 0.0–0.2)

## 2022-10-30 LAB — RETICULOCYTES
Immature Retic Fract: 33.7 % — ABNORMAL HIGH (ref 2.3–15.9)
RBC.: 2.85 MIL/uL — ABNORMAL LOW (ref 3.87–5.11)
Retic Count, Absolute: 86.9 10*3/uL (ref 19.0–186.0)
Retic Ct Pct: 3.1 % (ref 0.4–3.1)

## 2022-10-30 LAB — BASIC METABOLIC PANEL
Anion gap: 15 (ref 5–15)
BUN: 37 mg/dL — ABNORMAL HIGH (ref 8–23)
CO2: 28 mmol/L (ref 22–32)
Calcium: 8.8 mg/dL — ABNORMAL LOW (ref 8.9–10.3)
Chloride: 96 mmol/L — ABNORMAL LOW (ref 98–111)
Creatinine, Ser: 2.02 mg/dL — ABNORMAL HIGH (ref 0.44–1.00)
GFR, Estimated: 23 mL/min — ABNORMAL LOW (ref >60)
Glucose, Bld: 111 mg/dL — ABNORMAL HIGH (ref 70–99)
Potassium: 3.5 mmol/L (ref 3.5–5.1)
Sodium: 139 mmol/L (ref 135–145)

## 2022-10-30 LAB — SURGICAL PCR SCREEN
MRSA, PCR: NEGATIVE
Staphylococcus aureus: NEGATIVE

## 2022-10-30 LAB — VITAMIN B12: Vitamin B-12: 235 pg/mL (ref 180–914)

## 2022-10-30 LAB — PREPARE RBC (CROSSMATCH)

## 2022-10-30 LAB — IRON AND TIBC
Iron: 80 ug/dL (ref 28–170)
Saturation Ratios: 27 % (ref 10.4–31.8)
TIBC: 295 ug/dL (ref 250–450)
UIBC: 215 ug/dL

## 2022-10-30 LAB — FOLATE: Folate: 9.1 ng/mL (ref >5.9)

## 2022-10-30 LAB — FERRITIN: Ferritin: 48 ng/mL (ref 11–307)

## 2022-10-30 SURGERY — FIXATION, FRACTURE, INTERTROCHANTERIC, WITH INTRAMEDULLARY ROD
Anesthesia: Spinal | Laterality: Right

## 2022-10-30 MED ORDER — ONDANSETRON HCL 4 MG/2ML IJ SOLN: 4.0000 mg | Freq: Once | INTRAMUSCULAR | Status: DC | PRN

## 2022-10-30 MED ORDER — BUPIVACAINE IN DEXTROSE 0.75-8.25 % IT SOLN
INTRATHECAL | Status: DC | PRN
  Administered 2022-10-30: 1.6 mL via INTRATHECAL

## 2022-10-30 MED ORDER — ENOXAPARIN SODIUM 40 MG/0.4ML IJ SOSY
40.0000 mg | PREFILLED_SYRINGE | INTRAMUSCULAR | Status: DC
Start: 2022-10-31 — End: 2022-10-31
  Administered 2022-10-31: 40 mg via SUBCUTANEOUS
  Filled 2022-10-30: qty 0.4

## 2022-10-30 MED ORDER — ONDANSETRON HCL 4 MG/2ML IJ SOLN
INTRAMUSCULAR | Status: AC
  Filled 2022-10-30: qty 2

## 2022-10-30 MED ORDER — KETAMINE HCL 50 MG/5ML IJ SOSY
PREFILLED_SYRINGE | INTRAMUSCULAR | Status: AC
  Filled 2022-10-30: qty 5

## 2022-10-30 MED ORDER — 0.9 % SODIUM CHLORIDE (POUR BTL) OPTIME
TOPICAL | Status: DC | PRN
  Administered 2022-10-30: 1000 mL

## 2022-10-30 MED ORDER — FENTANYL CITRATE (PF) 100 MCG/2ML IJ SOLN
25.0000 ug | INTRAMUSCULAR | Status: DC | PRN
  Administered 2022-10-30: 50 ug via INTRAVENOUS

## 2022-10-30 MED ORDER — OXYCODONE HCL 5 MG PO TABS
5.0000 mg | ORAL_TABLET | ORAL | Status: DC | PRN
  Administered 2022-10-30 – 2022-11-02 (×5): 5 mg via ORAL
  Administered 2022-11-06: 7.5 mg via ORAL
  Administered 2022-11-07 – 2022-11-08 (×4): 5 mg via ORAL
  Administered 2022-11-08 – 2022-11-09 (×2): 7.5 mg via ORAL
  Filled 2022-10-30 (×2): qty 1
  Filled 2022-10-30: qty 2
  Filled 2022-10-30: qty 1
  Filled 2022-10-30: qty 2
  Filled 2022-10-30 (×7): qty 1
  Filled 2022-10-30 (×2): qty 2

## 2022-10-30 MED ORDER — KETAMINE HCL 10 MG/ML IJ SOLN
INTRAMUSCULAR | Status: DC | PRN
  Administered 2022-10-30: 20 mg via INTRAVENOUS

## 2022-10-30 MED ORDER — PHENYLEPHRINE HCL-NACL 20-0.9 MG/250ML-% IV SOLN
INTRAVENOUS | Status: DC | PRN
  Administered 2022-10-30: 50 ug/min via INTRAVENOUS

## 2022-10-30 MED ORDER — MOMETASONE FURO-FORMOTEROL FUM 100-5 MCG/ACT IN AERO
2.0000 | INHALATION_SPRAY | Freq: Two times a day (BID) | RESPIRATORY_TRACT | Status: DC
  Administered 2022-10-30 – 2022-11-14 (×30): 2 via RESPIRATORY_TRACT
  Filled 2022-10-30 (×3): qty 8.8

## 2022-10-30 MED ORDER — SODIUM CHLORIDE 0.9% IV SOLUTION: Freq: Once | INTRAVENOUS | Status: DC

## 2022-10-30 MED ORDER — MONTELUKAST SODIUM 10 MG PO TABS
10.0000 mg | ORAL_TABLET | Freq: Every evening | ORAL | Status: DC
  Administered 2022-10-31 – 2022-11-14 (×13): 10 mg via ORAL
  Filled 2022-10-30 (×14): qty 1

## 2022-10-30 MED ORDER — POTASSIUM CHLORIDE 2 MEQ/ML IV SOLN
INTRAVENOUS | Status: AC
  Filled 2022-10-30 (×2): qty 1000

## 2022-10-30 MED ORDER — ATORVASTATIN CALCIUM 40 MG PO TABS
40.0000 mg | ORAL_TABLET | Freq: Every evening | ORAL | Status: DC
  Administered 2022-10-31 – 2022-11-05 (×5): 40 mg via ORAL
  Filled 2022-10-30 (×6): qty 1

## 2022-10-30 MED ORDER — CHLORHEXIDINE GLUCONATE 0.12 % MT SOLN
15.0000 mL | OROMUCOSAL | Status: AC
  Administered 2022-10-30: 15 mL via OROMUCOSAL
  Filled 2022-10-30 (×2): qty 15

## 2022-10-30 MED ORDER — METOPROLOL TARTRATE 25 MG PO TABS
75.0000 mg | ORAL_TABLET | Freq: Two times a day (BID) | ORAL | Status: DC
  Administered 2022-10-30 – 2022-11-01 (×4): 75 mg via ORAL
  Filled 2022-10-30 (×4): qty 3

## 2022-10-30 MED ORDER — LACTATED RINGERS IV SOLN
INTRAVENOUS | Status: DC | PRN
Start: 2022-10-30 — End: 2022-10-30

## 2022-10-30 MED ORDER — CEFAZOLIN SODIUM-DEXTROSE 2-4 GM/100ML-% IV SOLN
2.0000 g | Freq: Three times a day (TID) | INTRAVENOUS | Status: AC
  Administered 2022-10-30 – 2022-10-31 (×2): 2 g via INTRAVENOUS
  Filled 2022-10-30 (×2): qty 100

## 2022-10-30 MED ORDER — CHLORHEXIDINE GLUCONATE 4 % EX SOLN
60.0000 mL | Freq: Once | CUTANEOUS | Status: AC
  Administered 2022-10-30: 4 via TOPICAL

## 2022-10-30 MED ORDER — ALBUTEROL SULFATE (2.5 MG/3ML) 0.083% IN NEBU
INHALATION_SOLUTION | RESPIRATORY_TRACT | Status: AC
  Administered 2022-10-30: 2.5 mg
  Filled 2022-10-30: qty 3

## 2022-10-30 MED ORDER — CEFAZOLIN SODIUM-DEXTROSE 2-4 GM/100ML-% IV SOLN
2.0000 g | INTRAVENOUS | Status: AC
Start: 2022-10-30 — End: 2022-10-30
  Administered 2022-10-30: 2 g via INTRAVENOUS
  Filled 2022-10-30: qty 100

## 2022-10-30 MED ORDER — POVIDONE-IODINE 10 % EX SWAB: 2.0000 | Freq: Once | CUTANEOUS | Status: DC

## 2022-10-30 MED ORDER — PROPOFOL 1000 MG/100ML IV EMUL
INTRAVENOUS | Status: AC
  Filled 2022-10-30: qty 100

## 2022-10-30 MED ORDER — PROPOFOL 500 MG/50ML IV EMUL
INTRAVENOUS | Status: DC | PRN
  Administered 2022-10-30: 25 ug/kg/min via INTRAVENOUS

## 2022-10-30 MED ORDER — FENTANYL CITRATE (PF) 100 MCG/2ML IJ SOLN
INTRAMUSCULAR | Status: AC
  Administered 2022-10-30: 50 ug via INTRAVENOUS
  Filled 2022-10-30: qty 2

## 2022-10-30 MED ORDER — PROPOFOL 10 MG/ML IV BOLUS
INTRAVENOUS | Status: DC | PRN
  Administered 2022-10-30: 20 mg via INTRAVENOUS

## 2022-10-30 MED ORDER — IPRATROPIUM-ALBUTEROL 0.5-2.5 (3) MG/3ML IN SOLN: 3.0000 mL | RESPIRATORY_TRACT | Status: DC

## 2022-10-30 SURGICAL SUPPLY — 43 items
ADH SKN CLS LQ APL DERMABOND (GAUZE/BANDAGES/DRESSINGS) ×1
APL PRP STRL LF DISP 70% ISPRP (MISCELLANEOUS) ×1
BAG COUNTER SPONGE SURGICOUNT (BAG) ×1 IMPLANT
BAG SPNG CNTER NS LX DISP (BAG) ×1
BIT DRILL INTERTAN LAG SCREW (BIT) IMPLANT
BIT DRILL LONG 4.0 (BIT) IMPLANT
CATH FOLEY 2WAY SLVR 5CC 14FR (CATHETERS) IMPLANT
CHLORAPREP W/TINT 26 (MISCELLANEOUS) ×1 IMPLANT
COVER PERINEAL POST (MISCELLANEOUS) ×1 IMPLANT
COVER SURGICAL LIGHT HANDLE (MISCELLANEOUS) ×1 IMPLANT
DERMABOND ADVANCED .7 DNX6 (GAUZE/BANDAGES/DRESSINGS) ×1 IMPLANT
DRAPE C-ARM 42X72 X-RAY (DRAPES) ×1 IMPLANT
DRAPE C-ARMOR (DRAPES) ×1 IMPLANT
DRAPE STERI IOBAN 125X83 (DRAPES) ×1 IMPLANT
DRAPE U-SHAPE 47X51 STRL (DRAPES) ×2 IMPLANT
DRILL BIT LONG 4.0 (BIT) ×1
DRSG MEPILEX POST OP 4X8 (GAUZE/BANDAGES/DRESSINGS) IMPLANT
DRSG TEGADERM 4X4.75 (GAUZE/BANDAGES/DRESSINGS) ×3 IMPLANT
ELECT REM PT RETURN 15FT ADLT (MISCELLANEOUS) IMPLANT
GAUZE SPONGE 4X4 16PLY NS LF (WOUND CARE) ×1 IMPLANT
GLOVE BIO SURGEON STRL SZ 6.5 (GLOVE) ×1 IMPLANT
GLOVE BIOGEL PI IND STRL 6.5 (GLOVE) ×1 IMPLANT
GLOVE BIOGEL PI IND STRL 8 (GLOVE) ×1 IMPLANT
GLOVE SURG ORTHO 8.0 STRL STRW (GLOVE) ×2 IMPLANT
GOWN STRL REUS W/TWL XL LVL3 (GOWN DISPOSABLE) ×3 IMPLANT
GUIDE PIN 3.2X343 (PIN) ×2
GUIDE PIN 3.2X343MM (PIN) ×2
KIT BASIN OR (CUSTOM PROCEDURE TRAY) ×1 IMPLANT
KIT TURNOVER KIT A (KITS) IMPLANT
MANIFOLD NEPTUNE II (INSTRUMENTS) ×1 IMPLANT
NAIL INTERTAN 10X18 130D 10S (Nail) IMPLANT
NS IRRIG 1000ML POUR BTL (IV SOLUTION) ×1 IMPLANT
PACK GENERAL/GYN (CUSTOM PROCEDURE TRAY) ×1 IMPLANT
PAD ARMBOARD 7.5X6 YLW CONV (MISCELLANEOUS) ×1 IMPLANT
PIN GUIDE 3.2X343MM (PIN) IMPLANT
SCREW LAG COMPR KIT 100/95 (Screw) IMPLANT
SCREW TRIGEN LOW PROF 5.0X32.5 (Screw) IMPLANT
SUT MNCRL AB 3-0 PS2 18 (SUTURE) ×1 IMPLANT
SUT VIC AB 0 CT1 27 (SUTURE) ×1
SUT VIC AB 0 CT1 27XBRD ANBCTR (SUTURE) ×1 IMPLANT
SUT VIC AB 2-0 CT2 27 (SUTURE) ×1 IMPLANT
SUT VIC AB CT1 27XBRD ANBCTRL (SUTURE) ×1
TOWEL OR NON WOVEN STRL DISP B (DISPOSABLE) ×1 IMPLANT

## 2022-10-30 NOTE — Op Note (Signed)
DATE OF SURGERY:  10/30/2022  TIME: 6:06 PM  PATIENT NAME:  Felicia Benson  AGE: 89 y.o.  PRE-OPERATIVE DIAGNOSIS:  RIGHT HIP FRACTURE  POST-OPERATIVE DIAGNOSIS:  SAME  PROCEDURE:  INTRAMEDULLARY nailing of right femur  SURGEON:  Johnson Arizola A Kyria Bumgardner  ASSISTANT:  Kathie Dike, PA-C, was present and scrubbed throughout the case, critical for assistance with exposure, retraction, instrumentation, and closure.  OPERATIVE IMPLANTS:  [employer] Intertan Nail 10 x 180 mm , 100/95 lag compression screw, 1 distal interlock  Implant Name Type Inv. Item Serial No. Manufacturer Lot No. LRB No. Used Action  NAIL INTERTAN 10X18 130D 10S - [id number] Nail NAIL INTERTAN 10X18 130D 10S  [employer] [id number] Right 1 Implanted  SCREW LAG COMPR KIT 100/95 - [id number] Screw SCREW LAG COMPR KIT 100/95  [facility] [employer] [id number] Right 1 Implanted  SCREW TRIGEN LOW PROF 5.0X32.5 - [id number] Screw SCREW TRIGEN LOW PROF 5.0X32.5  [employer] [id number] Right 1 Implanted     ESTIMATED BLOOD LOSS: 150cc  PREOPERATIVE INDICATIONS:  Felicia Benson is a 89 y.o. year old who fell and suffered an RIGHT HIP FRACTURE. She was brought into the ER and then admitted and optimized and then elected for surgical intervention.    The risks benefits and alternatives were discussed with the patient including but not limited to the risks of nonoperative treatment, versus surgical intervention including infection, bleeding, nerve injury, malunion, nonunion, hardware prominence, hardware failure, need for hardware removal, blood clots, cardiopulmonary complications, morbidity, mortality, among others, and they were willing to proceed.    OPERATIVE PROCEDURE:  The patient was brought to the operating room and placed in the supine position. Anesthesia was administered. She was placed on the fracture table.  Closed reduction was performed under C-arm guidance.  Time  out was then performed after sterile prep and drape. She received preoperative antibiotics.  Small incision proximal to the greater trochanter was made and carried down through skin and subcutaneous tissue.  Threaded guidewire was directed at the tip of the greater trochanter and advanced into the proximal metaphysis.  Positioning was confirmed with fluoroscopy.  I then used an entry reamer to enter the medullary canal.  I then passed a 10 x 180 mm InterTAN down the center of the canal attached to the targeting arm.  I then used the targeting arm to make a percutaneous incision and directed a threaded guidewire up into the head/neck segment.  I confirmed adequate tip apex distance and measured the length.  I decided to place a 100 mm screw.  I then drilled the path for the compression screw and placed an antirotation bar.  I then placed the lag screw and then placed the compression screw and compressed approximately 5 mm.  The proximal portion of the nail was statically locked.   I used the targeting arm to place a distal interlocking screw.  The targeting arm was removed.  Final fluoroscopic imaging was obtained.    The wounds were irrigated copiously, and vancomycin powder was placed in the wounds.  The gluteal fascia was closed with 0 Vicryl, and skin was closed with 2-0 Vicryl and 3-0 Monocryl.  Sterile dressing was applied with Dermabond, 4 x 4 gauze, and Tegaderm.  The patient was awakened and returned to PACU in stable and satisfactory condition. There were no complications and the patient tolerated the procedure well.   Post op recs: WB: WBAT RLE Abx: ancef x23 hours post op  Imaging: PACU xrays Dressing: keep intact until follow up, change PRN if soiled or saturated. DVT prophylaxis: lovenox starting POD1 x4 weeks Follow up: 2 weeks after surgery for a wound check with Dr. Blanchie Dessert at [facility].  Address: [address], [place], [place] [address]  Office Phone: [phone]  Weber Cooks, MD Orthopaedic Surgery

## 2022-10-30 NOTE — Plan of Care (Signed)
  Problem: Clinical Measurements: Goal: Will remain free from infection Outcome: Progressing Goal: Diagnostic test results will improve Outcome: Progressing Goal: Respiratory complications will improve Outcome: Progressing Goal: Cardiovascular complication will be avoided Outcome: Progressing   Problem: Activity: Goal: Risk for activity intolerance will decrease Outcome: Progressing   

## 2022-10-30 NOTE — Progress Notes (Signed)
PT Cancellation Note  Patient Details Name: TEWANDA DUITSMAN MRN: [id number] DOB: 10-04-1930   Cancelled Treatment:    Reason Eval/Treat Not Completed: Other (comment)  Likely OR today;  Will follow up for PT eval postop;   Van Clines, PT  Acute Rehabilitation Services Office [phone] Secure Chat welcomed    Levi Aland 10/30/2022, 8:37 AM

## 2022-10-30 NOTE — Anesthesia Preprocedure Evaluation (Signed)
Anesthesia Evaluation  Patient identified by MRN, date of birth, ID band Patient awake    Reviewed: Allergy & Precautions, NPO status , Patient's Chart, lab work & pertinent test results, reviewed documented beta blocker date and time   Airway Mallampati: II  TM Distance: >3 FB Neck ROM: Full    Dental  (+) Dental Advisory Given, Lower Dentures, Upper Dentures   Pulmonary sleep apnea , COPD (4L [place] O2 QDAY, 5L [place] 02 QHS),  oxygen dependent, former smoker   Pulmonary exam normal breath sounds clear to auscultation       Cardiovascular hypertension, Pt. on medications and Pt. on home beta blockers + CAD, + Cardiac Stents (RCA) and +CHF  Normal cardiovascular exam+ pacemaker  Rhythm:Regular Rate:Normal     Neuro/Psych TIA   GI/Hepatic Neg liver ROS,GERD  Medicated,,  Endo/Other  Obesity   Renal/GU Renal InsufficiencyRenal disease     Musculoskeletal Right femur fracture    Abdominal   Peds  Hematology  (+) Blood dyscrasia, anemia Plt 187k   Anesthesia Other Findings Day of surgery medications reviewed with the patient.  Reproductive/Obstetrics                             Anesthesia Physical Anesthesia Plan  ASA: 4  Anesthesia Plan: Spinal   Post-op Pain Management: Tylenol PO (pre-op)*   Induction: Intravenous  PONV Risk Score and Plan: 2 and Treatment may vary due to age or medical condition, TIVA, Propofol infusion, Dexamethasone and Ondansetron  Airway Management Planned: Natural Airway and Simple Face Mask  Additional Equipment:   Intra-op Plan:   Post-operative Plan:   Informed Consent: I have reviewed the patients History and Physical, chart, labs and discussed the procedure including the risks, benefits and alternatives for the proposed anesthesia with the patient or authorized representative who has indicated his/her understanding and acceptance.   Patient has DNR.   Discussed DNR with patient, Discussed DNR with power of attorney and Suspend DNR.   Dental advisory given  Plan Discussed with: CRNA  Anesthesia Plan Comments:         Anesthesia Quick Evaluation

## 2022-10-30 NOTE — Transfer of Care (Signed)
Immediate Anesthesia Transfer of Care Note  Patient: Felicia Benson  Procedure(s) Performed: INTRAMEDULLARY nailing of right femur (Right)  Patient Location: PACU  Anesthesia Type:Spinal  Level of Consciousness: awake, alert , and oriented  Airway & Oxygen Therapy: Patient Spontanous Breathing and Patient connected to face mask oxygen  Post-op Assessment: Report given to RN and Post -op Vital signs reviewed and stable  Post vital signs: Reviewed and stable  Last Vitals:  Vitals Value Taken Time  BP 117/62 10/30/22 1900  Temp    Pulse 67 10/30/22 1904  Resp 19 10/30/22 1904  SpO2 93 % 10/30/22 1904  Vitals shown include unfiled device data.  Last Pain:  Vitals:   10/30/22 0900  TempSrc:   PainSc: 0-No pain         Complications: No notable events documented.

## 2022-10-30 NOTE — H&P (View-Only) (Signed)
     Subjective: Patient reports well-controlled since the block she got yesterday.  She localizes pain only to the right hip area.  She denies pain other joints or extremities.  She denies distal numbness and tingling.  Objective:   VITALS:   Vitals:   10/29/22 1302 10/29/22 1334 10/29/22 1344 10/30/22 0500  BP: 94/70  107/73   Pulse: (!) 108     Resp: 18  18   Temp: 98.7 F (37.1 C)     TempSrc: Axillary     SpO2: 90% 90% 92%   Weight:    86.5 kg  Height:        Intact pulses distally Dorsiflexion/Plantar flexion intact No cellulitis present Compartment soft    Lab Results  Component Value Date   WBC 13.9 (H) 10/29/2022   HGB 9.7 (L) 10/29/2022   HCT 33.0 (L) 10/29/2022   MCV 95.7 10/29/2022   PLT 268 10/29/2022   BMET    Component Value Date/Time   NA 141 10/29/2022 0254   K 3.5 10/29/2022 0254   CL 101 10/29/2022 0254   CO2 26 10/29/2022 0254   GLUCOSE 125 (H) 10/29/2022 0254   BUN 23 10/29/2022 0254   CREATININE 1.07 (H) 10/29/2022 0254   CALCIUM 8.5 (L) 10/29/2022 0254   GFRNONAA 49 (L) 10/29/2022 0254    Xray: Right hip x-rays reviewed demonstrates minimally displaced intertrochanteric femur fracture  Assessment/Plan:     Principal Problem:   Closed right hip fracture (HCC) Active Problems:   Essential hypertension   Chronic hypoxic respiratory failure (HCC)   Hyperlipidemia   Chronic diastolic CHF (congestive heart failure) (HCC)   Fall at home, initial encounter   History of COPD   Allergic rhinitis   Anemia of chronic disease   Right intertrochanteric femur fracture  Discussed with the patient that she has a intertrochanteric femur fracture.  This is an unstable fracture.  Would benefit from open reduction internal fixation with cephalomedullary nail.  Risks and benefits of surgery were discussed with the patient.  Unfortunately limited OR availability here at [facility] today.  Will touch base with the OR early afternoon to see if  there will be space to do the surgery here later today, otherwise OR space is available at [facility] long tomorrow.  Please keep n.p.o. for now.  Gwendolynn Merkey A Lutie Pickler 10/30/2022, 7:03 AM   Weber Cooks, MD  Contact information:   [phone] 7am-5pm epic message Dr. Blanchie Dessert, or call office for patient follow up: [phone] After hours and holidays please check [email or web address] for group call information for [facility]

## 2022-10-30 NOTE — Interval H&P Note (Signed)
The patient has been re-examined, and the chart reviewed, and there have been no interval changes to the documented history and physical.    Plan for ORIF Right intertroch femur fracture with cephallomedullary nail.  The operative side was examined and the patient was confirmed to have sensation to DPN, SPN, TN intact, Motor EHL, ext, flex 5/5, and DP 2+, PT 2+, No significant edema.   The risks, benefits, and alternatives have been discussed at length with patient, and the patient is willing to proceed.  Right hip marked. Consent has been signed.

## 2022-10-30 NOTE — Anesthesia Procedure Notes (Addendum)
Spinal  Patient location during procedure: OR Start time: 10/30/2022 4:52 PM End time: 10/30/2022 5:02 PM Reason for block: surgical anesthesia Staffing Performed: anesthesiologist  Anesthesiologist: Collene Schlichter, MD Performed by: Collene Schlichter, MD Authorized by: Collene Schlichter, MD   Preanesthetic Checklist Completed: patient identified, IV checked, risks and benefits discussed, surgical consent, monitors and equipment checked, pre-op evaluation and timeout performed Spinal Block Patient position: right lateral decubitus Prep: DuraPrep and site prepped and draped Patient monitoring: continuous pulse ox and blood pressure Approach: midline Location: L3-4 Injection technique: single-shot Needle Needle type: Pencan and Whitacre  Needle gauge: 22 G Assessment Events: CSF return Additional Notes Functioning IV was confirmed and monitors were applied. Sterile prep and drape, including hand hygiene, mask and sterile gloves were used. The patient was positioned and the spine was prepped. The skin was anesthetized with lidocaine.  Free flow of clear CSF was obtained prior to injecting local anesthetic into the CSF.  The spinal needle aspirated freely following injection.  The needle was carefully withdrawn.  The patient tolerated the procedure well. Consent was obtained prior to procedure with all questions answered and concerns addressed. Risks including but not limited to bleeding, infection, nerve damage, paralysis, failed block, inadequate analgesia, allergic reaction, high spinal, itching and headache were discussed and the patient wished to proceed.   Arrie Aran, MD

## 2022-10-30 NOTE — Progress Notes (Signed)
     Subjective: Patient reports well-controlled since the block she got yesterday.  She localizes pain only to the right hip area.  She denies pain other joints or extremities.  She denies distal numbness and tingling.  Objective:   VITALS:   Vitals:   10/29/22 1302 10/29/22 1334 10/29/22 1344 10/30/22 0500  BP: 94/70  107/73   Pulse: (!) 108     Resp: 18  18   Temp: 98.7 F (37.1 C)     TempSrc: Axillary     SpO2: 90% 90% 92%   Weight:    86.5 kg  Height:        Intact pulses distally Dorsiflexion/Plantar flexion intact No cellulitis present Compartment soft    Lab Results  Component Value Date   WBC 13.9 (H) 10/29/2022   HGB 9.7 (L) 10/29/2022   HCT 33.0 (L) 10/29/2022   MCV 95.7 10/29/2022   PLT 268 10/29/2022   BMET    Component Value Date/Time   NA 141 10/29/2022 0254   K 3.5 10/29/2022 0254   CL 101 10/29/2022 0254   CO2 26 10/29/2022 0254   GLUCOSE 125 (H) 10/29/2022 0254   BUN 23 10/29/2022 0254   CREATININE 1.07 (H) 10/29/2022 0254   CALCIUM 8.5 (L) 10/29/2022 0254   GFRNONAA 49 (L) 10/29/2022 0254    Xray: Right hip x-rays reviewed demonstrates minimally displaced intertrochanteric femur fracture  Assessment/Plan:     Principal Problem:   Closed right hip fracture (HCC) Active Problems:   Essential hypertension   Chronic hypoxic respiratory failure (HCC)   Hyperlipidemia   Chronic diastolic CHF (congestive heart failure) (HCC)   Fall at home, initial encounter   History of COPD   Allergic rhinitis   Anemia of chronic disease   Right intertrochanteric femur fracture  Discussed with the patient that she has a intertrochanteric femur fracture.  This is an unstable fracture.  Would benefit from open reduction internal fixation with cephalomedullary nail.  Risks and benefits of surgery were discussed with the patient.  Unfortunately limited OR availability here at [facility] today.  Will touch base with the OR early afternoon to see if  there will be space to do the surgery here later today, otherwise OR space is available at [facility] long tomorrow.  Please keep n.p.o. for now.  Gwendolynn Merkey A Lutie Pickler 10/30/2022, 7:03 AM   Weber Cooks, MD  Contact information:   [phone] 7am-5pm epic message Dr. Blanchie Dessert, or call office for patient follow up: [phone] After hours and holidays please check [email or web address] for group call information for [facility]

## 2022-10-30 NOTE — Progress Notes (Signed)
Anesthesia made aware of Hgb 7.9 today. Ordered to crossmatch two units and transfuse one at this time. Orders placed in EPIC.

## 2022-10-30 NOTE — Progress Notes (Signed)
PROGRESS NOTE    Felicia Benson  UVO:[id number] DOB: January 23, 1930 DOA: 10/28/2022 PCP: [name], MD    Brief Narrative:   Felicia Benson is a 89 y.o. female with past medical history significant for chronic respiratory failure/COPD on 4 L nasal cannula at baseline, chronic diastolic congestive heart failure, HTN, HLD, anemia of chronic medical disease who presented to [facility] ED on 10/20 from home via EMS complaining of right hip and knee pain following mechanical fall.  Denies loss of consciousness or striking his head.  Patient reported immediate pain to his right hip region following fall with inability to move his right lower extremity.  Denies headache, blurred vision, chest pain, no shortness of breath, no palpitations, no N/V/D, no dizziness, no syncope versus presyncopal episode.  Takes baby aspirin outpatient, otherwise not on any additional blood thinners.  In the ED, temperature 97.6 F, HR 72, RR 15, BP 135/68, SpO2 92% on 4 L nasal cannula.  WBC 13.9, hemoglobin 9.7, platelet count 268.  Sodium 141, potassium 3.5, chloride 101, CO2 26, glucose 125, BUN 23, creatinine 1.07, AST 19, ALT 14, total bili 1.0.  Right hip/pelvis with acute displaced right intertrochanteric femoral fracture.  Right knee x-ray with no acute fracture/dislocation, soft tissue swelling inferior to patella likely representative contusion.  Chest x-ray with emphysematous and chronic bronchitic changes of the lungs, no focal consolidation.  CT head/C-spine with no acute intracranial abnormality, no displaced fracture, noted postoperative anterior fixation/fusion C3-C5, stable heterogeneous enlargement of right thyroid and isthmus likely indicative of multinodular goiter.  Orthopedics was consulted.  TRH consulted for admission for further evaluation and management of right displaced intertrochanteric femoral fracture.  Assessment & Plan:   Displaced right intertrochanteric femoral fracture Patient presenting to ED  with right hip pain with inability ambulate following mechanical fall at home.  Imaging notable for acute displaced right intertrochanteric femoral fracture.  -- Orthopedics following, appreciate assistance -- Vitamin D 25-hydroxy level: 87.66 -- NPO, pending ORIF/IMN by orthopedics this afternoon -- PT/OT evaluation postoperatively, anticipate likely need of SNF placement  Acute renal failure Etiology likely secondary to dehydration from prolonged n.p.o. status while awaiting operative management as above. -- Cr 1.21>1.07>2.02 -- start LR w/ 20 meQ KCL at 3mL/h x 1 day --Hold home losartan due to AKI as above -- repeat BMP in am  Chronic respiratory failure/COPD on home O2 Baseline oxygen requirement 4 L per nasal cannula.  Chest x-ray on admission with noted emphysematous/chronic bronchitic changes with no focal consolidation. -- Dulera 2 puffs twice daily -- Incruse Ellipta 1 puff daily -- Montelukast 10 mg p.o. nightly --Continue supplemental oxygen, maintain SpO2 greater than 88%  Chronic diastolic congestive heart failure, compensated Essential hypertension -- Metoprolol tartrate 75 mg p.o. twice daily -- Hold home amlodipine and losartan for now -- Continue monitor BP  Hyperlipidemia -- Atorvastatin 40 mg p.o. daily  Anemia of chronic medical disease -- Hgb 10.4>9.7>7.9 -- CBC daily -- transfuse for hemoglobin <7.0   DVT prophylaxis: SCDs Start: 10/28/22 2232    Code Status: Limited: Do not attempt resuscitation (DNR) -DNR-LIMITED -Do Not Intubate/DNI  Family Communication: No family present at bedside this morning  Disposition Plan:  Level of care: Med-Surg Status is: Inpatient Remains inpatient appropriate because: Pending operative management for hip fracture, may need SNF placement thereafter    Consultants:  [facility], Dr. Blanchie Dessert  Procedures:  Pending ORIF/IMN for right hip fracture  Antimicrobials:  None   Subjective: Patient's examined  bedside, resting comfortably.  Lying in bed.  Pain controlled when she remains "still".  Awaiting operative management for hip fracture, pending this afternoon.  No other specific questions or concerns at this time.  Denies headache, no visual changes, no chest pain, no palpitations, no shortness of breath, no abdominal pain, no fever/chills/night sweats, no focal weakness, no fatigue, no paresthesias.  No acute events overnight per nursing.  Objective: Vitals:   10/29/22 2000 10/30/22 0500 10/30/22 0832 10/30/22 0900  BP: 106/72   107/67  Pulse:      Resp:      Temp: 98 F (36.7 C)     TempSrc: Oral     SpO2:   98%   Weight:  86.5 kg    Height:        Intake/Output Summary (Last 24 hours) at 10/30/2022 1357 Last data filed at 10/30/2022 1220 Gross per 24 hour  Intake 166.47 ml  Output 200 ml  Net -33.53 ml   Filed Weights   10/28/22 1940 10/30/22 0500  Weight: 84.4 kg 86.5 kg    Examination:  Physical Exam: GEN: NAD, alert and oriented x 3, wd/wn HEENT: NCAT, PERRL, EOMI, sclera clear, MMM PULM: CTAB w/o wheezes/crackles, normal respiratory effort CV: RRR w/o M/G/R GI: abd soft, NTND, NABS, no R/G/M MSK: no peripheral edema NEURO: No focal neurological deficits PSYCH: normal mood/affect Integumentary: no concerning rashes/lesions/wounds noted exposed skin surfaces    Data Reviewed: I have personally reviewed following labs and imaging studies  CBC: Recent Labs  Lab 10/28/22 1919 10/29/22 0254 10/30/22 0747  WBC 8.8 13.9* 15.0*  NEUTROABS 5.4 11.1* 12.5*  HGB 10.4* 9.7* 7.9*  HCT 34.4* 33.0* 25.3*  MCV 91.2 95.7 89.4  PLT 272 268 187   Basic Metabolic Panel: Recent Labs  Lab 10/28/22 1919 10/28/22 2331 10/29/22 0254 10/30/22 0747  NA 144  --  141 139  K 3.7  --  3.5 3.5  CL 100  --  101 96*  CO2 29  --  26 28  GLUCOSE 154*  --  125* 111*  BUN 23  --  23 37*  CREATININE 1.21*  --  1.07* 2.02*  CALCIUM 8.7*  --  8.5* 8.8*  MG  --  1.5* 1.5*  --     GFR: Estimated Creatinine Clearance: 20.5 mL/min (A) (by C-G formula based on SCr of 2.02 mg/dL (H)). Liver Function Tests: Recent Labs  Lab 10/29/22 0254  AST 19  ALT 14  ALKPHOS 68  BILITOT 1.0  PROT 6.1*  ALBUMIN 2.6*   No results for input(s): "LIPASE", "AMYLASE" in the last 168 hours. No results for input(s): "AMMONIA" in the last 168 hours. Coagulation Profile: Recent Labs  Lab 10/29/22 0254  INR 1.1   Cardiac Enzymes: No results for input(s): "CKTOTAL", "CKMB", "CKMBINDEX", "TROPONINI" in the last 168 hours. BNP (last 3 results) No results for input(s): "PROBNP" in the last 8760 hours. HbA1C: No results for input(s): "HGBA1C" in the last 72 hours. CBG: No results for input(s): "GLUCAP" in the last 168 hours. Lipid Profile: No results for input(s): "CHOL", "HDL", "LDLCALC", "TRIG", "CHOLHDL", "LDLDIRECT" in the last 72 hours. Thyroid Function Tests: No results for input(s): "TSH", "T4TOTAL", "FREET4", "T3FREE", "THYROIDAB" in the last 72 hours. Anemia Panel: No results for input(s): "VITAMINB12", "FOLATE", "FERRITIN", "TIBC", "IRON", "RETICCTPCT" in the last 72 hours. Sepsis Labs: No results for input(s): "PROCALCITON", "LATICACIDVEN" in the last 168 hours.  No results found for this or any previous visit (from the past 240 hour(s)).  Radiology Studies: CT Cervical Spine Wo Contrast  Result Date: 10/28/2022 CLINICAL DATA:  Poly trauma, blunt.  Trip and fall injury. EXAM: CT CERVICAL SPINE WITHOUT CONTRAST TECHNIQUE: Multidetector CT imaging of the cervical spine was performed without intravenous contrast. Multiplanar CT image reconstructions were also generated. RADIATION DOSE REDUCTION: This exam was performed according to the departmental dose-optimization program which includes automated exposure control, adjustment of the mA and/or kV according to patient size and/or use of iterative reconstruction technique. COMPARISON:  CTA neck 12/31/2020  FINDINGS: Alignment: Normal alignment. Skull base and vertebrae: No acute fracture. No primary bone lesion or focal pathologic process. Soft tissues and spinal canal: No prevertebral fluid or swelling. No visible canal hematoma. Disc levels: Postoperative anterior plate and screw fixation and intervertebral fusion from C3 through C5. Fused segments appears intact. Surgical hardware appears intact. Degenerative changes at the other levels with disc space narrowing and endplate osteophyte formation. Degenerative changes in the facet joints. Upper chest: Mild interstitial changes in the lung apices, possibly edema. Surgical absence of the left thyroid gland. Diffuse heterogeneous enlargement of the right thyroid and isthmus. No change since previous study. Appearance is likely indicate nodular goiter. Other: None. IMPRESSION: 1. Normal alignment.  No acute displaced fractures identified. 2. Postoperative anterior fixation and intervertebral fusion from C3 through C5. Fused segments appear intact. 3. Mild interstitial changes in the lung apices may indicate edema. 4. Stable heterogeneous enlargement of the right thyroid and isthmus likely indicating nodular goiter. This has been evaluated on previous imaging. (ref: J Am Coll Radiol. 2015 Feb;12(2): 143-50). Electronically Signed   By: Burman Nieves M.D.   On: 10/28/2022 21:18   CT HEAD WO CONTRAST ( )  Result Date: 10/28/2022 CLINICAL DATA:  Poly trauma, blunt. Trip and fall injury during physical therapy. No loss of consciousness. EXAM: CT HEAD WITHOUT CONTRAST TECHNIQUE: Contiguous axial images were obtained from the base of the skull through the vertex without intravenous contrast. RADIATION DOSE REDUCTION: This exam was performed according to the departmental dose-optimization program which includes automated exposure control, adjustment of the mA and/or kV according to patient size and/or use of iterative reconstruction technique. COMPARISON:  MRI brain  01/11/2021.  CT head 12/31/2020 FINDINGS: Brain: Mild diffuse cerebral atrophy. Ventricular dilatation consistent with central atrophy. Low-attenuation changes in the deep white matter consistent with small vessel ischemia. No mass-effect or midline shift. No abnormal extra-axial fluid collections. Gray-white matter junctions are distinct. Basal cisterns are not effaced. No acute intracranial hemorrhage. There is an extra-axial soft tissue lesion in the left cavernous sinus and adjacent to the anterior margin of the left pons. This measures about 7 x 10 mm. No significant change in appearance since prior studies. Most likely a small meningioma or possibly neurogenic tumor. Vascular: No hyperdense vessel or unexpected calcification. Skull: Normal. Negative for fracture or focal lesion. Sinuses/Orbits: No acute finding. Other: None. IMPRESSION: 1. No acute intracranial abnormalities. 2. Chronic atrophy and small vessel ischemic changes. 3. Extra-axial soft tissue lesion in the left cavernous sinus region, unchanged since prior studies. This is most likely a small meningioma or possibly neurogenic tumor. Electronically Signed   By: Burman Nieves M.D.   On: 10/28/2022 21:13   DG Knee Complete 4 Views Right  Result Date: 10/28/2022 CLINICAL DATA:  Pain after a fall. EXAM: RIGHT KNEE - COMPLETE 4+ VIEW COMPARISON:  MRI 05/01/2021 FINDINGS: Degenerative changes in the right knee with medial and lateral compartment narrowing and tricompartment osteophyte formation. No evidence of acute fracture or  dislocation. No focal bone lesion or bone destruction. No significant effusions. Vascular calcifications. Soft tissue swelling over the infrapatellar region likely representing contusion. IMPRESSION: 1. No acute fracture or dislocation. 2. Moderate tricompartment degenerative changes. 3. Soft tissue swelling inferior to the patella likely representing soft tissue contusion. No effusion. Electronically Signed   By: Burman Nieves M.D.   On: 10/28/2022 21:06   DG Forearm Left  Result Date: 10/28/2022 CLINICAL DATA:  Pain after a fall. EXAM: LEFT FOREARM - 2 VIEW COMPARISON:  None Available. FINDINGS: Degenerative changes demonstrated in the radiocarpal and STT joints of the left wrist. Calcification of the triangular fibrocartilage. Radius and ulna appear intact. No acute displaced fractures identified. Soft tissues are unremarkable. IMPRESSION: Degenerative changes in the carpal bones. No acute bony abnormalities. Electronically Signed   By: Burman Nieves M.D.   On: 10/28/2022 21:05   DG Chest Portable 1 View  Result Date: 10/28/2022 CLINICAL DATA:  Larey Seat during physical therapy. Pain to the right hip and left forearm. EXAM: PORTABLE CHEST 1 VIEW COMPARISON:  07/21/2022 FINDINGS: Cardiac pacemaker. Borderline heart size with normal pulmonary vascularity. Emphysematous changes and interstitial changes in the lungs likely indicating chronic bronchitis. Pleural effusions and consolidation seen previously have resolved. No pneumothorax. Mediastinal contours appear intact. Calcification of the aorta. Degenerative changes in the spine and shoulders. Postoperative changes in the cervical spine. IMPRESSION: Emphysematous and chronic bronchitic changes in the lungs. No focal consolidation. Electronically Signed   By: Burman Nieves M.D.   On: 10/28/2022 21:04   DG Hip Unilat With Pelvis 2-3 Views Right  Result Date: 10/28/2022 CLINICAL DATA:  pain EXAM: DG HIP (WITH OR WITHOUT PELVIS) 2-3V RIGHT COMPARISON:  None Available. FINDINGS: Acute displaced right intertrochanteric femoral fracture. No right hip dislocation. No acute displaced fracture or dislocation of the left hip on frontal view. No acute displaced fracture or diastasis of the bones of the pelvis. At least mild right and moderate left hip degenerative changes. No aggressive appearing focal bone abnormality. Visualized lower lumbar spine demonstrates degenerative  changes. Atherosclerotic plaque. IMPRESSION: Acute displaced right intertrochanteric femoral fracture. Electronically Signed   By: Tish Frederickson M.D.   On: 10/28/2022 21:03        Scheduled Meds:  atorvastatin  40 mg Oral QPM   fluticasone  2 spray Each Nare Daily   metoprolol tartrate  75 mg Oral BID   mometasone-formoterol  2 puff Inhalation BID   montelukast  10 mg Oral QPM   povidone-iodine  2 Application Topical Once   umeclidinium bromide  1 puff Inhalation Daily   Continuous Infusions:   ceFAZolin (ANCEF) IV       LOS: 2 days    Time spent: 51 minutes spent on chart review, discussion with nursing staff, consultants, updating family and interview/physical exam; more than 50% of that time was spent in counseling and/or coordination of care.    Alvira Philips [place], DO [facility] Available via Epic secure chat 7am-7pm After these hours, please refer to coverage provider listed on [email or web address] 10/30/2022, 1:57 PM

## 2022-10-31 ENCOUNTER — Encounter (HOSPITAL_COMMUNITY): Payer: Self-pay | Admitting: Orthopedic Surgery

## 2022-10-31 DIAGNOSIS — S72001A Fracture of unspecified part of neck of right femur, initial encounter for closed fracture: Secondary | ICD-10-CM | POA: Diagnosis not present

## 2022-10-31 LAB — BASIC METABOLIC PANEL
Anion gap: 9 (ref 5–15)
BUN: 42 mg/dL — ABNORMAL HIGH (ref 8–23)
CO2: 27 mmol/L (ref 22–32)
Calcium: 7.7 mg/dL — ABNORMAL LOW (ref 8.9–10.3)
Chloride: 97 mmol/L — ABNORMAL LOW (ref 98–111)
Creatinine, Ser: 1.95 mg/dL — ABNORMAL HIGH (ref 0.44–1.00)
GFR, Estimated: 24 mL/min — ABNORMAL LOW (ref >60)
Glucose, Bld: 126 mg/dL — ABNORMAL HIGH (ref 70–99)
Potassium: 3.8 mmol/L (ref 3.5–5.1)
Sodium: 133 mmol/L — ABNORMAL LOW (ref 135–145)

## 2022-10-31 LAB — CBC
HCT: 28.9 % — ABNORMAL LOW (ref 36.0–46.0)
Hemoglobin: 9.4 g/dL — ABNORMAL LOW (ref 12.0–15.0)
MCH: 28.6 pg (ref 26.0–34.0)
MCHC: 32.5 g/dL (ref 30.0–36.0)
MCV: 87.8 fL (ref 80.0–100.0)
Platelets: 166 10*3/uL (ref 150–400)
RBC: 3.29 MIL/uL — ABNORMAL LOW (ref 3.87–5.11)
RDW: 16.9 % — ABNORMAL HIGH (ref 11.5–15.5)
WBC: 13.4 10*3/uL — ABNORMAL HIGH (ref 4.0–10.5)
nRBC: 0 % (ref 0.0–0.2)

## 2022-10-31 LAB — MAGNESIUM: Magnesium: 2.2 mg/dL (ref 1.7–2.4)

## 2022-10-31 MED ORDER — SODIUM CHLORIDE 0.9 % IV BOLUS
250.0000 mL | Freq: Once | INTRAVENOUS | Status: AC
  Administered 2022-10-31: 250 mL via INTRAVENOUS

## 2022-10-31 MED ORDER — PANTOPRAZOLE SODIUM 40 MG PO TBEC
40.0000 mg | DELAYED_RELEASE_TABLET | Freq: Every day | ORAL | Status: DC
  Administered 2022-10-31 – 2022-11-05 (×6): 40 mg via ORAL
  Filled 2022-10-31 (×8): qty 1

## 2022-10-31 MED ORDER — ENOXAPARIN SODIUM 30 MG/0.3ML IJ SOSY
30.0000 mg | PREFILLED_SYRINGE | INTRAMUSCULAR | Status: DC
  Administered 2022-11-01 – 2022-11-14 (×14): 30 mg via SUBCUTANEOUS
  Filled 2022-10-31 (×14): qty 0.3

## 2022-10-31 NOTE — Discharge Instructions (Signed)
Orthopedic Discharge Instructions  Diet: As you were doing prior to hospitalization   Shower:  May shower but keep the wounds dry, use an occlusive plastic wrap, NO SOAKING IN TUB.  If the bandage gets wet, change with a clean dry gauze.  If you have a splint on, leave the splint in place and keep the splint dry with a plastic bag.  Dressing:  You may change your dressing 3-5 days after surgery.  If the dressing remains clean and dry it can also be left on until follow up.  If you change the dressing replace with clean gauze and tape or ace wrap.  For most surgeries, the stitches used are dissolvable and don't need to be removed.  However, depending on your surgery, you may have stitches that will need to be removed in the office in about 2 weeks.    Activity:  Increase activity slowly as tolerated, but follow the weight bearing instructions below.  Do not drive for the next 4-6 weeks.  In addition, you cannot be taking narcotics while you drive, and you must feel in control of the vehicle.    Weight Bearing:   You may bear weight on the operative leg as tolerated.    Blood clot prevention (DVT Prophylaxis): After surgery you are at an increased risk for a blood clot. you were prescribed a blood thinner, lovenox 40mg , to be taken once daily for a total of 4 weeks from surgery to help reduce your risk of getting a blood clot. Signs of a pulmonary embolus (blood clot in the lungs) include sudden short of breath, feeling lightheaded or dizzy, chest pain with a deep breath, rapid pulse rapid breathing.  Signs of a blood clot in your arms or legs include new unexplained swelling and cramping, warm, red or darkened skin around the painful area.  Please call the office or 911 right away if these signs or symptoms develop.  To prevent constipation: you may use a stool softener such as - Colace (over the counter) 100 mg by mouth twice a day  Drink plenty of fluids (prune juice may be helpful) and high fiber  foods Miralax (over the counter) for constipation as needed.    Itching:  If you experience itching with your medications, try taking only a single pain pill, or even half a pain pill at a time.  You may take up to 10 pain pills per day, and you can also use benadryl over the counter for itching or also to help with sleep.   Precautions:  If you experience chest pain or shortness of breath - call 911 immediately for transfer to the hospital emergency department!!  Call office [phone]) for the following: Temperature greater than 101F Persistent nausea and vomiting Severe uncontrolled pain Redness, tenderness, or signs of infection (pain, swelling, redness, odor or green/yellow discharge around the site) Difficulty breathing, headache or visual disturbances Hives Persistent dizziness or light-headedness Extreme fatigue Any other questions or concerns you may have after discharge  In an emergency, call 911 or go to an Emergency Department at a nearby hospital  Follow- Up Appointment:  Please call for an appointment to be seen approximately 2-3 week after surgery in [facility] with your surgeon Dr. Weber Cooks - [phone] Address: [address], [place], [place] [address]

## 2022-10-31 NOTE — Progress Notes (Signed)
PROGRESS NOTE    Felicia Benson  UVO:[id number] DOB: [birth year] DOA: 10/28/2022 PCP: [name], MD    Brief Narrative:   Felicia Benson is a 89 y.o. female with past medical history significant for chronic respiratory failure/COPD on 4 L nasal cannula at baseline, chronic diastolic congestive heart failure, HTN, HLD, anemia of chronic medical disease who presented to [facility] ED on 10/20 from home via EMS complaining of right hip and knee pain following mechanical fall.  Denies loss of consciousness or striking his head.  Patient reported immediate pain to his right hip region following fall with inability to move his right lower extremity.  Denies headache, blurred vision, chest pain, no shortness of breath, no palpitations, no N/V/D, no dizziness, no syncope versus presyncopal episode.  Takes baby aspirin outpatient, otherwise not on any additional blood thinners.  In the ED, temperature 97.6 F, HR 72, RR 15, BP 135/68, SpO2 92% on 4 L nasal cannula.  WBC 13.9, hemoglobin 9.7, platelet count 268.  Sodium 141, potassium 3.5, chloride 101, CO2 26, glucose 125, BUN 23, creatinine 1.07, AST 19, ALT 14, total bili 1.0.  Right hip/pelvis with acute displaced right intertrochanteric femoral fracture.  Right knee x-ray with no acute fracture/dislocation, soft tissue swelling inferior to patella likely representative contusion.  Chest x-ray with emphysematous and chronic bronchitic changes of the lungs, no focal consolidation.  CT head/C-spine with no acute intracranial abnormality, no displaced fracture, noted postoperative anterior fixation/fusion C3-C5, stable heterogeneous enlargement of right thyroid and isthmus likely indicative of multinodular goiter.  Orthopedics was consulted.  TRH consulted for admission for further evaluation and management of right displaced intertrochanteric femoral fracture.  Assessment & Plan:   Displaced right intertrochanteric femoral fracture Patient presenting to ED  with right hip pain with inability ambulate following mechanical fall at home.  Imaging notable for acute displaced right intertrochanteric femoral fracture.  Orthopedics was consulted and patient underwent ORIF by Dr. Blanchie Dessert on 10/30/2022 -- Orthopedics following, appreciate assistance -- Vitamin D 25-hydroxy level: 87.66 -- WBAT RLE -- Lovenox for postoperative DVT prophylaxis -- PT/OT evaluation: Pending -- 2-week postoperative follow-up with orthopedics for wound check  Acute renal failure Etiology likely secondary to dehydration from prolonged n.p.o. status while awaiting operative management as above. -- Cr 1.21>1.07>2.02 -- start LR w/ 20 meQ KCL at 41mL/h x 1 day -- Hold home losartan due to AKI as above -- repeat BMP in am  Chronic respiratory failure/COPD on home O2 Baseline oxygen requirement 4 L per nasal cannula.  Chest x-ray on admission with noted emphysematous/chronic bronchitic changes with no focal consolidation. -- Dulera 2 puffs twice daily -- Incruse Ellipta 1 puff daily -- Montelukast 10 mg p.o. nightly --Continue supplemental oxygen, maintain SpO2 greater than 88%  Chronic diastolic congestive heart failure, compensated Essential hypertension -- Metoprolol tartrate 75 mg p.o. twice daily -- Hold home amlodipine and losartan for now -- Continue monitor BP  Hyperlipidemia -- Atorvastatin 40 mg p.o. daily  Anemia of chronic medical disease -- Hgb 10.4>9.7>7.9>9.4 -- CBC daily -- transfuse for hemoglobin <7.0 -- Repeat CBC in a.m.   DVT prophylaxis: enoxaparin (LOVENOX) injection 40 mg Start: 10/31/22 0900 SCDs Start: 10/28/22 2232    Code Status: Limited: Do not attempt resuscitation (DNR) -DNR-LIMITED -Do Not Intubate/DNI  Family Communication: No family present at bedside this morning  Disposition Plan:  Level of care: Med-Surg Status is: Inpatient Remains inpatient appropriate because: Pending PT/OT evaluation, anticipate need of SNF  placement  Consultants:  Orthopedics, Dr. Blanchie Dessert  Procedures:  ORIF/IMN for right hip fracture; Dr. Blanchie Dessert; 10/30/2022  Antimicrobials:  None   Subjective: Patient's examined bedside, resting comfortably.  Lying in bed.  Reports pain controlled.  Awaiting PT/OT evaluation.  No other specific questions or concerns at this time.  Denies headache, no visual changes, no chest pain, no palpitations, no shortness of breath, no abdominal pain, no fever/chills/night sweats, no focal weakness, no fatigue, no paresthesias.  No acute events overnight per nursing staff.  Objective: Vitals:   10/30/22 2021 10/31/22 0531 10/31/22 0740 10/31/22 0837  BP: 102/68 112/67 106/65   Pulse: 85 76 79   Resp: 20   19  Temp: 98 F (36.7 C) 97.9 F (36.6 C) 98.8 F (37.1 C)   TempSrc: Oral Oral Oral   SpO2: 90% 97% 93%   Weight:      Height:        Intake/Output Summary (Last 24 hours) at 10/31/2022 1337 Last data filed at 10/31/2022 0600 Gross per 24 hour  Intake 1605.82 ml  Output 150 ml  Net 1455.82 ml   Filed Weights   10/28/22 1940 10/30/22 0500 10/30/22 1523  Weight: 84.4 kg 86.5 kg 86.5 kg    Examination:  Physical Exam: GEN: NAD, alert and oriented x 3, wd/wn HEENT: NCAT, PERRL, EOMI, sclera clear, MMM PULM: CTAB w/o wheezes/crackles, normal respiratory effort CV: RRR w/o M/G/R GI: abd soft, NTND, NABS, no R/G/M MSK: no peripheral edema, surgical dressing noted in place, clean/dry/intact NEURO: No focal neurological deficits PSYCH: normal mood/affect Integumentary: Right lower extremity surgical dressing as above, otherwise no concerning rashes/lesions/wounds noted exposed skin surfaces    Data Reviewed: I have personally reviewed following labs and imaging studies  CBC: Recent Labs  Lab 10/28/22 1919 10/29/22 0254 10/30/22 0747 10/31/22 0541  WBC 8.8 13.9* 15.0* 13.4*  NEUTROABS 5.4 11.1* 12.5*  --   HGB 10.4* 9.7* 7.9* 9.4*  HCT 34.4* 33.0* 25.3* 28.9*   MCV 91.2 95.7 89.4 87.8  PLT 272 268 187 166   Basic Metabolic Panel: Recent Labs  Lab 10/28/22 1919 10/28/22 2331 10/29/22 0254 10/30/22 0747 10/31/22 0541  NA 144  --  141 139 133*  K 3.7  --  3.5 3.5 3.8  CL 100  --  101 96* 97*  CO2 29  --  26 28 27   GLUCOSE 154*  --  125* 111* 126*  BUN 23  --  23 37* 42*  CREATININE 1.21*  --  1.07* 2.02* 1.95*  CALCIUM 8.7*  --  8.5* 8.8* 7.7*  MG  --  1.5* 1.5*  --  2.2   GFR: Estimated Creatinine Clearance: 21.3 mL/min (A) (by C-G formula based on SCr of 1.95 mg/dL (H)). Liver Function Tests: Recent Labs  Lab 10/29/22 0254  AST 19  ALT 14  ALKPHOS 68  BILITOT 1.0  PROT 6.1*  ALBUMIN 2.6*   No results for input(s): "LIPASE", "AMYLASE" in the last 168 hours. No results for input(s): "AMMONIA" in the last 168 hours. Coagulation Profile: Recent Labs  Lab 10/29/22 0254  INR 1.1   Cardiac Enzymes: No results for input(s): "CKTOTAL", "CKMB", "CKMBINDEX", "TROPONINI" in the last 168 hours. BNP (last 3 results) No results for input(s): "PROBNP" in the last 8760 hours. HbA1C: No results for input(s): "HGBA1C" in the last 72 hours. CBG: No results for input(s): "GLUCAP" in the last 168 hours. Lipid Profile: No results for input(s): "CHOL", "HDL", "LDLCALC", "TRIG", "CHOLHDL", "LDLDIRECT" in the last  72 hours. Thyroid Function Tests: No results for input(s): "TSH", "T4TOTAL", "FREET4", "T3FREE", "THYROIDAB" in the last 72 hours. Anemia Panel: Recent Labs    10/30/22 0747 10/30/22 2229  VITAMINB12  --  235  FOLATE 9.1  --   FERRITIN  --  48  TIBC  --  295  IRON  --  80  RETICCTPCT 3.1  --    Sepsis Labs: No results for input(s): "PROCALCITON", "LATICACIDVEN" in the last 168 hours.  Recent Results (from the past 240 hour(s))  Surgical pcr screen     Status: None   Collection Time: 10/30/22 12:42 PM   Specimen: Nasal Mucosa; Nasal Swab  Result Value Ref Range Status   MRSA, PCR NEGATIVE NEGATIVE Final    Staphylococcus aureus NEGATIVE NEGATIVE Final    Comment: (NOTE) The Xpert SA Assay (FDA approved for NASAL specimens in patients 16 years of age and older), is one component of a comprehensive surveillance program. It is not intended to diagnose infection nor to guide or monitor treatment. Performed at [facility], [address] [place], [place] [address]          Radiology Studies: DG HIP UNILAT W OR W/O PELVIS 2-3 VIEWS RIGHT  Result Date: 10/30/2022 CLINICAL DATA:  Postoperative state.  Right hip fracture. EXAM: DG HIP (WITH OR WITHOUT PELVIS) 2-3V RIGHT COMPARISON:  10/28/2022. FINDINGS: There is redemonstration of a comminuted intertrochanteric fracture of the right hip with interval placement of fixation hardware. Alignment appears anatomic. Degenerative changes are noted at the hips bilaterally and in the lower lumbar spine. Postsurgical air is noted in the soft tissues lateral to the right hip. IMPRESSION: Status post open reduction internal fixation of right intertrochanteric fracture. Electronically Signed   By: Thornell Sartorius M.D.   On: 10/30/2022 23:19   DG FEMUR, MIN 2 VIEWS RIGHT  Result Date: 10/30/2022 CLINICAL DATA:  Elective surgery EXAM: RIGHT FEMUR 2 VIEWS COMPARISON:  Right hip x-ray 10/28/2022 FINDINGS: Eight intraoperative fluoroscopic views of the right hip. There is a new right femoral intramedullary nail and hip screw fixating intratrochanteric fracture. Alignment is anatomic. Fluoroscopy time: 1 minute 9 seconds. Fluoroscopy dose: 14.86 micro gray. IMPRESSION: Intraoperative fluoroscopic views of the right hip. Electronically Signed   By: Darliss Cheney M.D.   On: 10/30/2022 22:08   DG C-Arm 1-60 Min-No Report  Result Date: 10/30/2022 Fluoroscopy was utilized by the requesting physician.  No radiographic interpretation.        Scheduled Meds:  atorvastatin  40 mg Oral QPM   enoxaparin (LOVENOX) injection  40 mg Subcutaneous Q24H   fluticasone   2 spray Each Nare Daily   metoprolol tartrate  75 mg Oral BID   mometasone-formoterol  2 puff Inhalation BID   montelukast  10 mg Oral QPM   umeclidinium bromide  1 puff Inhalation Daily   Continuous Infusions:  lactated ringers 1,000 mL with potassium chloride 20 mEq infusion Stopped (10/31/22 1307)     LOS: 3 days    Time spent: 51 minutes spent on chart review, discussion with nursing staff, consultants, updating family and interview/physical exam; more than 50% of that time was spent in counseling and/or coordination of care.    Alvira Philips [place], DO [facility] Available via Epic secure chat 7am-7pm After these hours, please refer to coverage provider listed on [email or web address] 10/31/2022, 1:37 PM

## 2022-10-31 NOTE — Evaluation (Signed)
Physical Therapy Evaluation Patient Details Name: Felicia Benson MRN: [id number] DOB: 09/15/1930 Today's Date: 10/31/2022  History of Present Illness  Felicia Benson is a 89 y.o. female admitted to [facility] on 10/28/2022 with acute right hip fracture; s/p IMNail 10/22; with medical history significant for chronic hypoxic respiratory failure on baseline and 4 L continuous nasal cannula, COPD, chronic diastolic heart failure, essential pretension, hyperlipidemia anemia of chronic disease is a baseline hemoglobin range 10-12,  Clinical Impression   Pt admitted with above diagnosis. Lives at home home with daughter, in a single-level home with 1 step to enter; Prior to admission, pt was able to manage walking in her home with cane and occasionally RW; Reports Independence with basic ADLs; Presents to PT with a significant funcitonal decline compare to her baseline, R hip pain, decr activity tolerance, dependencies with bed mobility and transfers; Currently needs +2/3 Total assist for bed mobility, soft BPs sitting up EOB, coupled with dizziness; Patient will benefit from continued inpatient follow up therapy, <3 hours/day;  Pt currently with functional limitations due to the deficits listed below (see PT Problem List). Pt will benefit from skilled PT to increase their independence and safety with mobility to allow discharge to the venue listed below.                Can travel by private vehicle   No    Equipment Recommendations Wheelchair (measurements PT);Wheelchair cushion (measurements PT);Hospital bed;Hoyer lift  Recommendations for Other Services       Functional Status Assessment Patient has had a recent decline in their functional status and demonstrates the ability to make significant improvements in function in a reasonable and predictable amount of time.     Precautions / Restrictions Precautions Precautions: Fall;Other (comment) Precaution Comments: BPs  soft Restrictions Weight Bearing Restrictions: No RLE Weight Bearing: Weight bearing as tolerated      Mobility  Bed Mobility Overal bed mobility: Needs Assistance Bed Mobility: Supine to Sit, Sit to Supine     Supine to sit: Total assist, +2 for physical assistance Sit to supine: Total assist, +2 for physical assistance   General bed mobility comments: Total assist and heavy use of bedpad to move hips; pt able to initiate sitting up and use UEs for reaching    Transfers Overall transfer level: Needs assistance Equipment used: 2 person hand held assist (bed pad) Transfers: Bed to chair/wheelchair/BSC            Lateral/Scoot Transfers: Total assist, +2 physical assistance, +2 safety/equipment General transfer comment: Simulated lateral scooting transfer at EOB using bed pad and +3 assist to scoot towards [facility]    Ambulation/Gait                  Stairs            Wheelchair Mobility     Tilt Bed    Modified Rankin (Stroke Patients Only)       Balance                                             Pertinent Vitals/Pain Pain Assessment Pain Assessment: Faces Faces Pain Scale: Hurts even more Pain Location: R hip with motion Pain Descriptors / Indicators: Grimacing, Guarding Pain Intervention(s): Monitored during session, Premedicated before session    Home Living Family/patient expects to be discharged to:: Private residence  Living Arrangements: Children Available Help at Discharge: Family;Available 24 hours/day Type of Home: House Home Access: Stairs to enter Entrance Stairs-Rails: None Entrance Stairs-Number of Steps: 1   Home Layout: One level Home Equipment: Agricultural consultant (2 wheels);Cane - single point;Shower seat;BSC/3in1 Additional Comments: 4L O2 baseline, wears CPAP at night    Prior Function Prior Level of Function : Needs assist             Mobility Comments: SPC mainly, RW for community mobility and  "sometimes at home" ADLs Comments: ind with ADLs     Extremity/Trunk Assessment   Upper Extremity Assessment Upper Extremity Assessment: Defer to OT evaluation (very fragile skin)    Lower Extremity Assessment Lower Extremity Assessment: RLE deficits/detail;LLE deficits/detail RLE Deficits / Details: Decr aROM and strength, limited by pain post fracture and surgical repair LLE Deficits / Details: Decr knee flexion ROM aith soreness/stiffness       Communication   Communication Communication: No apparent difficulties (Possible hearing impairment)  Cognition Arousal: Alert Behavior During Therapy: WFL for tasks assessed/performed Overall Cognitive Status: Within Functional Limits for tasks assessed                                 General Comments: Overall fatigued, able to interact and answer questions with occasional delay        General Comments General comments (skin integrity, edema, etc.): Session initiated on 6L supplemental O2, and sats decr to low to mid 80s; incr O2 to 8L, and sats stayed consistently up in the 90s; BPs soft throughout time sitting EOB; 81/53, 82/50, 86/56; HR 73; Opted to lay back down and RN joined [place]; Back supine, BP 96/62    Exercises     Assessment/Plan    PT Assessment Patient needs continued PT services  PT Problem List Decreased strength;Decreased range of motion;Decreased activity tolerance;Decreased balance;Decreased mobility;Decreased knowledge of use of DME;Cardiopulmonary status limiting activity;Pain       PT Treatment Interventions DME instruction;Gait training;Functional mobility training;Therapeutic activities;Therapeutic exercise;Balance training;Neuromuscular re-education;Cognitive remediation;Patient/family education;Wheelchair mobility training    PT Goals (Current goals can be found in the Care Plan section)  Acute Rehab PT Goals Patient Stated Goal: Didn't specifically state PT Goal Formulation: With  patient/family Time For Goal Achievement: 11/14/22 Potential to Achieve Goals: Fair    Frequency Min 1X/week     Co-evaluation               AM-PAC PT "6 Clicks" Mobility  Outcome Measure Help needed turning from your back to your side while in a flat bed without using bedrails?: Total Help needed moving from lying on your back to sitting on the side of a flat bed without using bedrails?: Total Help needed moving to and from a bed to a chair (including a wheelchair)?: Total Help needed standing up from a chair using your arms (e.g., wheelchair or bedside chair)?: Total Help needed to walk in hospital room?: Total Help needed climbing 3-5 steps with a railing? : Total 6 Click Score: 6    End of Session Equipment Utilized During Treatment: Oxygen (bed pads) Activity Tolerance: Other (comment) (limited by soft BPs) Patient left: in bed;with call bell/phone within reach;with bed alarm set;with nursing/sitter in room Nurse Communication: Mobility status;Other (comment) (vitals response to upright activity) PT Visit Diagnosis: Unsteadiness on feet (R26.81);History of falling (Z91.81);Muscle weakness (generalized) (M62.81);Pain Pain - Right/Left: Right Pain - part of body: Hip  Time: 1610-9604 PT Time Calculation (min) (ACUTE ONLY): 46 min   Charges:   PT Evaluation $PT Eval Moderate Complexity: 1 Mod PT Treatments $Therapeutic Activity: 23-37 mins PT General Charges $$ ACUTE PT VISIT: 1 Visit         Van Clines, PT  Acute Rehabilitation Services Office [phone] Secure Chat welcomed   Levi Aland 10/31/2022, 7:04 PM

## 2022-10-31 NOTE — Plan of Care (Signed)
  Problem: Elimination: Goal: Will not experience complications related to bowel motility Outcome: Progressing   Problem: Elimination: Goal: Will not experience complications related to urinary retention Outcome: Progressing   Problem: Health Behavior/Discharge Planning: Goal: Ability to manage health-related needs will improve Outcome: Progressing

## 2022-10-31 NOTE — Progress Notes (Signed)
     Subjective: Patient reports pain as mild.  Lying comfortably in bed this morning. No Concerns. Discussed plan for mobilization with PT today.  Objective:   VITALS:   Vitals:   10/30/22 2000 10/30/22 2008 10/30/22 2021 10/31/22 0531  BP: 103/66  102/68 112/67  Pulse: 76 80 85 76  Resp: (!) 23 19 20    Temp:  (!) 97.3 F (36.3 C) 98 F (36.7 C) 97.9 F (36.6 C)  TempSrc:   Oral Oral  SpO2: 90% 92% 90% 97%  Weight:      Height:        Sensation intact distally Intact pulses distally Dorsiflexion/Plantar flexion intact Compartment soft    Lab Results  Component Value Date   WBC 13.4 (H) 10/31/2022   HGB 9.4 (L) 10/31/2022   HCT 28.9 (L) 10/31/2022   MCV 87.8 10/31/2022   PLT 166 10/31/2022   BMET    Component Value Date/Time   NA 139 10/30/2022 0747   K 3.5 10/30/2022 0747   CL 96 (L) 10/30/2022 0747   CO2 28 10/30/2022 0747   GLUCOSE 111 (H) 10/30/2022 0747   BUN 37 (H) 10/30/2022 0747   CREATININE 2.02 (H) 10/30/2022 0747   CALCIUM 8.8 (L) 10/30/2022 0747   GFRNONAA 23 (L) 10/30/2022 0747      Xray: well reduced intertroch fx, hardware in place without adverse features  Assessment/Plan: 1 Day Post-Op   Principal Problem:   Closed right hip fracture (HCC) Active Problems:   Essential hypertension   Chronic hypoxic respiratory failure (HCC)   Hyperlipidemia   Chronic diastolic CHF (congestive heart failure) (HCC)   Fall at home, initial encounter   History of COPD   Allergic rhinitis   Anemia of chronic disease  S/p right Intertroch femur fracture ORIF 10/30/22  Post op recs: WB: WBAT RLE Abx: ancef x23 hours post op Imaging: PACU xrays Dressing: keep intact until follow up, change PRN if soiled or saturated. DVT prophylaxis: lovenox starting POD1 x4 weeks Follow up: 2 weeks after surgery for a wound check with Dr. Blan[facility] Dessert at [facility].  Address: [address], [place], [place] [address]  Office Phone: [phone]   Weber Cooks, MD Orthopaedic Surgery    Lexii Walsh A Denissa Cozart 10/31/2022, 6:19 AM   Weber Cooks, MD  Contact information:   [phone] 7am-5pm epic message Dr. Blanchie Dessert, or call office for patient follow up: [phone] After hours and holidays please check [email or web address] for group call information for [facility]

## 2022-10-31 NOTE — Care Management Important Message (Signed)
Important Message  Patient Details  Name: MALVA THANG MRN: [id number] Date of Birth: November 20, 1930   Important Message Given:  Yes - Medicare IM     Sherilyn Banker 10/31/2022, 2:48 PM

## 2022-11-01 DIAGNOSIS — S72001A Fracture of unspecified part of neck of right femur, initial encounter for closed fracture: Secondary | ICD-10-CM | POA: Diagnosis not present

## 2022-11-01 LAB — CBC
HCT: 27.3 % — ABNORMAL LOW (ref 36.0–46.0)
Hemoglobin: 8.8 g/dL — ABNORMAL LOW (ref 12.0–15.0)
MCH: 28.5 pg (ref 26.0–34.0)
MCHC: 32.2 g/dL (ref 30.0–36.0)
MCV: 88.3 fL (ref 80.0–100.0)
Platelets: 173 10*3/uL (ref 150–400)
RBC: 3.09 MIL/uL — ABNORMAL LOW (ref 3.87–5.11)
RDW: 16.8 % — ABNORMAL HIGH (ref 11.5–15.5)
WBC: 12.8 10*3/uL — ABNORMAL HIGH (ref 4.0–10.5)
nRBC: 0 % (ref 0.0–0.2)

## 2022-11-01 LAB — BASIC METABOLIC PANEL
Anion gap: 11 (ref 5–15)
BUN: 51 mg/dL — ABNORMAL HIGH (ref 8–23)
CO2: 24 mmol/L (ref 22–32)
Calcium: 7.7 mg/dL — ABNORMAL LOW (ref 8.9–10.3)
Chloride: 97 mmol/L — ABNORMAL LOW (ref 98–111)
Creatinine, Ser: 1.82 mg/dL — ABNORMAL HIGH (ref 0.44–1.00)
GFR, Estimated: 26 mL/min — ABNORMAL LOW (ref >60)
Glucose, Bld: 126 mg/dL — ABNORMAL HIGH (ref 70–99)
Potassium: 4 mmol/L (ref 3.5–5.1)
Sodium: 132 mmol/L — ABNORMAL LOW (ref 135–145)

## 2022-11-01 LAB — BPAM RBC
Blood Product Expiration Date: 202411152359
Blood Product Expiration Date: 202411152359
Blood Product Expiration Date: 202411152359
Blood Product Expiration Date: 202411172359
ISSUE DATE / TIME: 202410221652
ISSUE DATE / TIME: 202410221744
Unit Type and Rh: 6200
Unit Type and Rh: 6200
Unit Type and Rh: 6200
Unit Type and Rh: 6200

## 2022-11-01 LAB — TYPE AND SCREEN
Unit division: 0
Unit division: 0
Unit division: 0
Unit division: 0

## 2022-11-01 MED ORDER — SENNOSIDES-DOCUSATE SODIUM 8.6-50 MG PO TABS
1.0000 | ORAL_TABLET | Freq: Two times a day (BID) | ORAL | Status: DC
  Administered 2022-11-01 – 2022-11-10 (×15): 1 via ORAL
  Filled 2022-11-01 (×16): qty 1

## 2022-11-01 MED ORDER — SODIUM CHLORIDE 0.9 % IV BOLUS
500.0000 mL | Freq: Once | INTRAVENOUS | Status: AC
  Administered 2022-11-01: 500 mL via INTRAVENOUS

## 2022-11-01 MED ORDER — POLYETHYLENE GLYCOL 3350 17 G PO PACK
17.0000 g | PACK | Freq: Every day | ORAL | Status: DC | PRN
  Administered 2022-11-02: 17 g via ORAL
  Filled 2022-11-01: qty 1

## 2022-11-01 NOTE — Progress Notes (Signed)
     Subjective: Patient reports pain as mild.  Lying comfortably in bed this morning. Worked with PT yesterday but reports it was difficult. She was able to sit up side of bed with assistance but not able to stand. Eager to do more with therapy.  Objective:   VITALS:   Vitals:   11/01/22 0010 11/01/22 0337 11/01/22 0439 11/01/22 0538  BP: 99/61 (!) 91/51 (!) 111/58 108/68  Pulse:  86 91 95  Resp:  16 16 18   Temp:  98 F (36.7 C)  98 F (36.7 C)  TempSrc:  Oral  Axillary  SpO2:  98% 97% 92%  Weight:      Height:        Sensation intact distally Intact pulses distally Dorsiflexion/Plantar flexion intact Compartment soft    Lab Results  Component Value Date   WBC 13.4 (H) 10/31/2022   HGB 9.4 (L) 10/31/2022   HCT 28.9 (L) 10/31/2022   MCV 87.8 10/31/2022   PLT 166 10/31/2022   BMET    Component Value Date/Time   NA 133 (L) 10/31/2022 0541   K 3.8 10/31/2022 0541   CL 97 (L) 10/31/2022 0541   CO2 27 10/31/2022 0541   GLUCOSE 126 (H) 10/31/2022 0541   BUN 42 (H) 10/31/2022 0541   CREATININE 1.95 (H) 10/31/2022 0541   CALCIUM 7.7 (L) 10/31/2022 0541   GFRNONAA 24 (L) 10/31/2022 0541      Xray: well reduced intertroch fx, hardware in place without adverse features  Assessment/Plan: 2 Days Post-Op   Principal Problem:   Closed right hip fracture (HCC) Active Problems:   Essential hypertension   Chronic hypoxic respiratory failure (HCC)   Hyperlipidemia   Chronic diastolic CHF (congestive heart failure) (HCC)   Fall at home, initial encounter   History of COPD   Allergic rhinitis   Anemia of chronic disease  S/p right Intertroch femur fracture ORIF 10/30/22  Post op recs: WB: WBAT RLE Abx: ancef x23 hours post op Imaging: PACU xrays Dressing: keep intact until follow up, change PRN if soiled or saturated. DVT prophylaxis: lovenox starting POD1 x4 weeks Follow up: 2 weeks after surgery for a wound check with Dr. Blanchie Dessert at [facility].  Address: [address], [place], [place] [address]  Office Phone: [phone]   Weber Cooks, MD Orthopaedic Surgery    Felicia Benson 11/01/2022, 7:23 AM   Weber Cooks, MD  Contact information:   [phone] 7am-5pm epic message Dr. Blanchie Dessert, or call office for patient follow up: [phone] After hours and holidays please check [email or web address] for group call information for [facility]

## 2022-11-01 NOTE — Evaluation (Signed)
Occupational Therapy Evaluation Patient Details Name: Felicia Benson MRN: [id number] DOB: 1930/12/31 Today's Date: 11/01/2022   History of Present Illness Pt is a 89 y/o female presenting on 10/20 after fall.  Found with R hip fx, s/p IM nail 10/22.  PMH includes: chronic hypoxic respiratory failure on baseline and 4 L continuous nasal cannula, COPD, chronic diastolic heart failure, essential pretension, hyperlipidemia anemia of chronic disease is a baseline hemoglobin range 10-12.   Clinical Impression   PTA patient reports independent with ADLs, mobility using using cane or RW. Admitted for above and presents with problem list below.  Patient limited today by hypotension, worsening when sitting EOB.  She requires max assist +2 for bed mobility, min to total assist +2 for ADLs.  O2 stable today on 6L O2, but BP soft (down to 73/45 after sitting EOB).  Pt pleasantly confused, lethargic and requires cueing to maintain attention throughout session.  Based on performance today, believe patient will best benefit from continued OT services acutely and after dc at inpatient setting with <3hrs/day to optimize independence, safety with ADLs and mobility.      If plan is discharge home, recommend the following: Two people to help with walking and/or transfers;Two people to help with bathing/dressing/bathroom    Functional Status Assessment  Patient has had a recent decline in their functional status and demonstrates the ability to make significant improvements in function in a reasonable and predictable amount of time.  Equipment Recommendations  Other (comment) (DEFER)    Recommendations for Other Services       Precautions / Restrictions Precautions Precautions: Fall;Other (comment) Precaution Comments: BPs soft, oxygen mask Restrictions Weight Bearing Restrictions: Yes RLE Weight Bearing: Weight bearing as tolerated      Mobility Bed Mobility Overal bed mobility: Needs Assistance Bed  Mobility: Supine to Sit, Sit to Supine     Supine to sit: +2 for physical assistance, Max assist Sit to supine: +2 for physical assistance, Max assist   General bed mobility comments: overall total assist using bed pad to/from EOB, able to assist with UEs minimally on rails    Transfers                   General transfer comment: unable at this time due to blood pressures.      Balance Overall balance assessment: Needs assistance Sitting-balance support: Bilateral upper extremity supported, Single extremity supported Sitting balance-Leahy Scale: Poor Sitting balance - Comments: r lateral lean that increased with time sitting. Pt was only able to sit for ~1 min Postural control: Right lateral lean                                 ADL either performed or assessed with clinical judgement   ADL Overall ADL's : Needs assistance/impaired     Grooming: Minimal assistance;Sitting Grooming Details (indicate cue type and reason): supine without assist, dynaimcally at EOB LOB to R side         Upper Body Dressing : Moderate assistance;Sitting   Lower Body Dressing: Total assistance;+2 for physical assistance;Sitting/lateral leans;Bed level     Toilet Transfer Details (indicate cue type and reason): deferred d/t low BP         Functional mobility during ADLs: Maximal assistance;+2 for physical assistance       Vision   Vision Assessment?: No apparent visual deficits Additional Comments: glasses for reading     Perception  Praxis         Pertinent Vitals/Pain Pain Assessment Pain Assessment: Faces Faces Pain Scale: Hurts whole lot Pain Location: R hip with motion Pain Descriptors / Indicators: Grimacing, Guarding Pain Intervention(s): Limited activity within patient's tolerance, Monitored during session, Repositioned, Premedicated before session     Extremity/Trunk Assessment Upper Extremity Assessment Upper Extremity Assessment:  Generalized weakness (brusied arms bilaterally)   Lower Extremity Assessment Lower Extremity Assessment: Defer to PT evaluation       Communication Communication Communication: Hearing impairment   Cognition Arousal: Lethargic Behavior During Therapy: WFL for tasks assessed/performed Overall Cognitive Status: Impaired/Different from baseline                                 General Comments: some confusion noted, likely medication and low BP related.  States year is 2240, but able to correct when prompted.  She follows simple commands with increased time.  Further cognitive assessment warrented     General Comments  Pt is on 6L O2 via oxymask with O2 sats 97%. PT BP in supine 89/60, sitting EOB 88/60 initially, pt started to fall toward the R and lean her head on therapist pt was assist at total A to supine with BP getting taken and BP 73/45. Pt was placed in trendelenburg and BP up to 90/47. Pt was positioned with HOB lower than 30 degrees and BP 81/50. RN is aware    Exercises     Shoulder Instructions      Home Living Family/patient expects to be discharged to:: Private residence Living Arrangements: Children Available Help at Discharge: Family;Available 24 hours/day Type of Home: House Home Access: Stairs to enter [employer] of Steps: 1 Entrance Stairs-Rails: None Home Layout: One level     Bathroom Shower/Tub: Producer, television/film/video: Handicapped height     Home Equipment: Agricultural consultant (2 wheels);Cane - single point;Shower seat;BSC/3in1   Additional Comments: 4L O2 baseline, wears CPAP at night      Prior Functioning/Environment Prior Level of Function : Needs assist             Mobility Comments: SPC mainly, RW for community mobility and "sometimes at home" ADLs Comments: ind with ADLs        OT Problem List: Decreased strength;Decreased activity tolerance;Impaired balance (sitting and/or  standing);Pain;Obesity;Cardiopulmonary status limiting activity;Decreased knowledge of precautions;Decreased knowledge of use of DME or AE;Decreased safety awareness;Decreased cognition      OT Treatment/Interventions: Self-care/ADL training;Therapeutic exercise;DME and/or AE instruction;Therapeutic activities;Cognitive remediation/compensation;Balance training;Patient/family education    OT Goals(Current goals can be found in the care plan section) Acute Rehab OT Goals Patient Stated Goal: get better OT Goal Formulation: With patient Time For Goal Achievement: 11/15/22 Potential to Achieve Goals: Fair  OT Frequency: Min 1X/week    Co-evaluation PT/OT/SLP Co-Evaluation/Treatment: Yes Reason for Co-Treatment: For patient/therapist safety;To address functional/ADL transfers PT goals addressed during session: Mobility/safety with mobility;Balance OT goals addressed during session: ADL's and self-care      AM-PAC OT "6 Clicks" Daily Activity     Outcome Measure Help from another person eating meals?: A Lot Help from another person taking care of personal grooming?: A Lot Help from another person toileting, which includes using toliet, bedpan, or urinal?: Total Help from another person bathing (including washing, rinsing, drying)?: A Lot Help from another person to put on and taking off regular upper body clothing?: A Lot Help from another person to  put on and taking off regular lower body clothing?: Total 6 Click Score: 10   End of Session Equipment Utilized During Treatment: Oxygen Nurse Communication: Mobility status;Other (comment) (BP)  Activity Tolerance: Treatment limited secondary to medical complications (Comment) (hypotension) Patient left: in bed;with call bell/phone within reach;with bed alarm set  OT Visit Diagnosis: Other abnormalities of gait and mobility (R26.89);Muscle weakness (generalized) (M62.81);Pain;History of falling (Z91.81) Pain - Right/Left: Right Pain -  part of body: Hip                Time: 0981-1914 OT Time Calculation (min): 32 min Charges:  OT General Charges $OT Visit: 1 Visit OT Evaluation $OT Eval Moderate Complexity: 1 Mod  Barry Brunner, OT Acute Rehabilitation Services Office [phone]   Chancy Milroy 11/01/2022, 2:23 PM

## 2022-11-01 NOTE — Progress Notes (Signed)
PROGRESS NOTE    BREIGH MCKEAG  NWG:[id number] DOB: 17-Aug-1930 DOA: 10/28/2022 PCP: [name], MD    Brief Narrative:   Felicia Benson is a 89 y.o. female with past medical history significant for chronic respiratory failure/COPD on 4 L nasal cannula at baseline, chronic diastolic congestive heart failure, HTN, HLD, anemia of chronic medical disease who presented to [facility] ED on 10/20 from home via EMS complaining of right hip and knee pain following mechanical fall.  Denies loss of consciousness or striking his head.  Patient reported immediate pain to his right hip region following fall with inability to move his right lower extremity.  Denies headache, blurred vision, chest pain, no shortness of breath, no palpitations, no N/V/D, no dizziness, no syncope versus presyncopal episode.  Takes baby aspirin outpatient, otherwise not on any additional blood thinners.  In the ED, temperature 97.6 F, HR 72, RR 15, BP 135/68, SpO2 92% on 4 L nasal cannula.  WBC 13.9, hemoglobin 9.7, platelet count 268.  Sodium 141, potassium 3.5, chloride 101, CO2 26, glucose 125, BUN 23, creatinine 1.07, AST 19, ALT 14, total bili 1.0.  Right hip/pelvis with acute displaced right intertrochanteric femoral fracture.  Right knee x-ray with no acute fracture/dislocation, soft tissue swelling inferior to patella likely representative contusion.  Chest x-ray with emphysematous and chronic bronchitic changes of the lungs, no focal consolidation.  CT head/C-spine with no acute intracranial abnormality, no displaced fracture, noted postoperative anterior fixation/fusion C3-C5, stable heterogeneous enlargement of right thyroid and isthmus likely indicative of multinodular goiter.  Orthopedics was consulted.  TRH consulted for admission for further evaluation and management of right displaced intertrochanteric femoral fracture.  Assessment & Plan:   Displaced right intertrochanteric femoral fracture Patient presenting to ED  with right hip pain with inability ambulate following mechanical fall at home.  Imaging notable for acute displaced right intertrochanteric femoral fracture.  Orthopedics was consulted and patient underwent ORIF by Dr. Blanchie Dessert on 10/30/2022 -- Orthopedics following, appreciate assistance -- Vitamin D 25-hydroxy level: 87.66 -- WBAT RLE -- Lovenox for postoperative DVT prophylaxis -- PT/OT evaluation: Pending -- 2-week postoperative follow-up with orthopedics for wound check  Acute renal failure Etiology likely secondary to dehydration from prolonged n.p.o. status while awaiting operative management as above. -- Cr 1.21>1.07>2.02>1.95>1.82 -- Hold home losartan due to AKI as above -- Continue to encourage increased oral intake -- repeat BMP in am  Chronic respiratory failure/COPD on home O2 Baseline oxygen requirement 4 L per nasal cannula.  Chest x-ray on admission with noted emphysematous/chronic bronchitic changes with no focal consolidation. -- Dulera 2 puffs twice daily -- Incruse Ellipta 1 puff daily -- Montelukast 10 mg p.o. nightly --Continue supplemental oxygen, maintain SpO2 greater than 88%  Chronic diastolic congestive heart failure, compensated Essential hypertension -- Hold home amlodipine, metoprolol and losartan for now -- 500 mL NS bolus today -- Continue monitor BP  Hyperlipidemia -- Atorvastatin 40 mg p.o. daily  Anemia of chronic medical disease -- Hgb 10.4>9.7>7.9>9.4 -- transfuse for hemoglobin <7.0 -- Repeat CBC in a.m.   DVT prophylaxis: enoxaparin (LOVENOX) injection 30 mg Start: 11/01/22 0900 SCDs Start: 10/28/22 2232    Code Status: Limited: Do not attempt resuscitation (DNR) -DNR-LIMITED -Do Not Intubate/DNI  Family Communication: No family present at bedside this morning  Disposition Plan:  Level of care: Med-Surg Status is: Inpatient Remains inpatient appropriate because: Pending SNF placement    Consultants:  Orthopedics, Dr.  Blanchie Dessert  Procedures:  ORIF/IMN for right hip fracture;  Dr. Blanchie Dessert; 10/30/2022  Antimicrobials:  Perioperative cefazolin   Subjective: Patient's examined bedside, resting comfortably.  Lying in bed.  Only able to tolerate sitting at edge of bed with physical therapy yesterday.  She hopes to be able to progress further today.  Pain is moderately controlled.  Patient with hypotension, received IV fluid bolus overnight.  Discussed with patient need to increase oral intake as well to avoid further IV fluid administration given her underlying CHF and respiratory compromise, but will give small 500 mL bolus today.  No other specific questions or concerns at this time.  Denies headache, no visual changes, no chest pain, no palpitations, no shortness of breath, no abdominal pain, no fever/chills/night sweats, no focal weakness, no fatigue, no paresthesias.  No acute events overnight per nursing staff.  Objective: Vitals:   11/01/22 0538 11/01/22 0823 11/01/22 1139 11/01/22 1142  BP: 108/68     Pulse: 95     Resp: 18     Temp: 98 F (36.7 C)     TempSrc: Axillary     SpO2: 92% 97% 97% 97%  Weight:      Height:       No intake or output data in the 24 hours ending 11/01/22 1214  Filed Weights   10/28/22 1940 10/30/22 0500 10/30/22 1523  Weight: 84.4 kg 86.5 kg 86.5 kg    Examination:  Physical Exam: GEN: NAD, alert and oriented x 3, wd/wn HEENT: NCAT, PERRL, EOMI, sclera clear, MMM PULM: CTAB w/o wheezes/crackles, normal respiratory effort CV: RRR w/o M/G/R GI: abd soft, NTND, NABS, no R/G/M MSK: no peripheral edema, surgical dressing noted in place, clean/dry/intact NEURO: No focal neurological deficits PSYCH: normal mood/affect Integumentary: Right lower extremity surgical dressing as above, otherwise no concerning rashes/lesions/wounds noted exposed skin surfaces    Data Reviewed: I have personally reviewed following labs and imaging studies  CBC: Recent Labs  Lab  10/28/22 1919 10/29/22 0254 10/30/22 0747 10/31/22 0541  WBC 8.8 13.9* 15.0* 13.4*  NEUTROABS 5.4 11.1* 12.5*  --   HGB 10.4* 9.7* 7.9* 9.4*  HCT 34.4* 33.0* 25.3* 28.9*  MCV 91.2 95.7 89.4 87.8  PLT 272 268 187 166   Basic Metabolic Panel: Recent Labs  Lab 10/28/22 1919 10/28/22 2331 10/29/22 0254 10/30/22 0747 10/31/22 0541 11/01/22 0816  NA 144  --  141 139 133* 132*  K 3.7  --  3.5 3.5 3.8 4.0  CL 100  --  101 96* 97* 97*  CO2 29  --  26 28 27 24   GLUCOSE 154*  --  125* 111* 126* 126*  BUN 23  --  23 37* 42* 51*  CREATININE 1.21*  --  1.07* 2.02* 1.95* 1.82*  CALCIUM 8.7*  --  8.5* 8.8* 7.7* 7.7*  MG  --  1.5* 1.5*  --  2.2  --    GFR: Estimated Creatinine Clearance: 22.8 mL/min (A) (by C-G formula based on SCr of 1.82 mg/dL (H)). Liver Function Tests: Recent Labs  Lab 10/29/22 0254  AST 19  ALT 14  ALKPHOS 68  BILITOT 1.0  PROT 6.1*  ALBUMIN 2.6*   No results for input(s): "LIPASE", "AMYLASE" in the last 168 hours. No results for input(s): "AMMONIA" in the last 168 hours. Coagulation Profile: Recent Labs  Lab 10/29/22 0254  INR 1.1   Cardiac Enzymes: No results for input(s): "CKTOTAL", "CKMB", "CKMBINDEX", "TROPONINI" in the last 168 hours. BNP (last 3 results) No results for input(s): "PROBNP" in the last 8760 hours.  HbA1C: No results for input(s): "HGBA1C" in the last 72 hours. CBG: No results for input(s): "GLUCAP" in the last 168 hours. Lipid Profile: No results for input(s): "CHOL", "HDL", "LDLCALC", "TRIG", "CHOLHDL", "LDLDIRECT" in the last 72 hours. Thyroid Function Tests: No results for input(s): "TSH", "T4TOTAL", "FREET4", "T3FREE", "THYROIDAB" in the last 72 hours. Anemia Panel: Recent Labs    10/30/22 0747 10/30/22 2229  VITAMINB12  --  235  FOLATE 9.1  --   FERRITIN  --  48  TIBC  --  295  IRON  --  80  RETICCTPCT 3.1  --    Sepsis Labs: No results for input(s): "PROCALCITON", "LATICACIDVEN" in the last 168  hours.  Recent Results (from the past 240 hour(s))  Surgical pcr screen     Status: None   Collection Time: 10/30/22 12:42 PM   Specimen: Nasal Mucosa; Nasal Swab  Result Value Ref Range Status   MRSA, PCR NEGATIVE NEGATIVE Final   Staphylococcus aureus NEGATIVE NEGATIVE Final    Comment: (NOTE) The Xpert SA Assay (FDA approved for NASAL specimens in patients 59 years of age and older), is one component of a comprehensive surveillance program. It is not intended to diagnose infection nor to guide or monitor treatment. Performed at [facility], [address], [place], [place] [address]          Radiology Studies: DG HIP UNILAT W OR W/O PELVIS 2-3 VIEWS RIGHT  Result Date: 10/30/2022 CLINICAL DATA:  Postoperative state.  Right hip fracture. EXAM: DG HIP (WITH OR WITHOUT PELVIS) 2-3V RIGHT COMPARISON:  10/28/2022. FINDINGS: There is redemonstration of a comminuted intertrochanteric fracture of the right hip with interval placement of fixation hardware. Alignment appears anatomic. Degenerative changes are noted at the hips bilaterally and in the lower lumbar spine. Postsurgical air is noted in the soft tissues lateral to the right hip. IMPRESSION: Status post open reduction internal fixation of right intertrochanteric fracture. Electronically Signed   By: Thornell Sartorius M.D.   On: 10/30/2022 23:19   DG FEMUR, MIN 2 VIEWS RIGHT  Result Date: 10/30/2022 CLINICAL DATA:  Elective surgery EXAM: RIGHT FEMUR 2 VIEWS COMPARISON:  Right hip x-ray 10/28/2022 FINDINGS: Eight intraoperative fluoroscopic views of the right hip. There is a new right femoral intramedullary nail and hip screw fixating intratrochanteric fracture. Alignment is anatomic. Fluoroscopy time: 1 minute 9 seconds. Fluoroscopy dose: 14.86 micro gray. IMPRESSION: Intraoperative fluoroscopic views of the right hip. Electronically Signed   By: Darliss Cheney M.D.   On: 10/30/2022 22:08   DG C-Arm 1-60 Min-No Report  Result  Date: 10/30/2022 Fluoroscopy was utilized by the requesting physician.  No radiographic interpretation.        Scheduled Meds:  atorvastatin  40 mg Oral QPM   enoxaparin (LOVENOX) injection  30 mg Subcutaneous Q24H   fluticasone  2 spray Each Nare Daily   metoprolol tartrate  75 mg Oral BID   mometasone-formoterol  2 puff Inhalation BID   montelukast  10 mg Oral QPM   pantoprazole  40 mg Oral Daily   umeclidinium bromide  1 puff Inhalation Daily   Continuous Infusions:     LOS: 4 days    Time spent: 51 minutes spent on chart review, discussion with nursing staff, consultants, updating family and interview/physical exam; more than 50% of that time was spent in counseling and/or coordination of care.    Alvira Philips [place], DO [facility] Available via Epic secure chat 7am-7pm After these hours, please refer  to coverage provider listed on [email or web address] 11/01/2022, 12:14 PM

## 2022-11-01 NOTE — TOC Initial Note (Addendum)
Transition of Care [facility]) - Initial/Assessment Note    Patient Details  Name: Felicia Benson MRN: [id number] Date of Birth: 30-Jun-1930  [facility] CM/SW Contact:    Lorri Frederick, LCSW Phone Number: 11/01/2022, 10:28 AM  Clinical Narrative:        CSW met with pt regarding PT recommendation for SNF.  Pt agreeable to this, medicare choice document provided, permission given to send out referral in hub.  Pt from home with daughter Alvis Lemmings, permission given to speak with her.  [facility] services currently in place.  CSW attempted to contact daughter Alvis Lemmings, left message.  Referral sent out in hub for SNF.          1130: TC daughter Alvis Lemmings.  She is in agreement with SNF.  Will be by later today to review bed offers.  Bed offers updated on medicare.gov listing and placed back in pt room.  1515: CSW met with pt and daughter Alvis Lemmings in room, updated bed offers.  [name] is going to go to [facility] and [facility], will make decision once she sees each facility.   Expected Discharge Plan: Skilled Nursing Facility Barriers to Discharge: Continued Medical Work up, SNF Pending bed offer   Patient Goals and CMS Choice Patient states their goals for this hospitalization and ongoing recovery are:: walk with walker, O2 at 4L CMS Medicare.gov Compare Post Acute Care list provided to:: Patient Choice offered to / list presented to : Patient      Expected Discharge Plan and Services In-house Referral: Clinical Social Work   Post Acute Care Choice: Skilled Nursing Facility Living arrangements for the past 2 months: Single Family Home                                      Prior Living Arrangements/Services Living arrangements for the past 2 months: Single Family Home Lives with:: Adult Children (daughter Alvis Lemmings) Patient language and need for interpreter reviewed:: Yes Do you feel safe going back to the place where you live?: Yes      Need for Family Participation in Patient Care:  Yes (Comment) Care giver support system in place?: Yes (comment) Current home services: Home OT, Home PT ([facility]) Criminal Activity/Legal Involvement Pertinent to Current Situation/Hospitalization: No - Comment as needed  Activities of Daily Living   ADL Screening (condition at time of admission) Independently performs ADLs?: Yes (appropriate for developmental age) Is the patient deaf or have difficulty hearing?: No Does the patient have difficulty seeing, even when wearing glasses/contacts?: No Does the patient have difficulty concentrating, remembering, or making decisions?: No  Permission Sought/Granted Permission sought to share information with : Family Supports Permission granted to share information with : Yes, Verbal Permission Granted  Share Information with NAME: daughter Alvis Lemmings  Permission granted to share info w AGENCY: SNF        Emotional Assessment Appearance:: Appears stated age Attitude/Demeanor/Rapport: Engaged Affect (typically observed): Appropriate Orientation: : Oriented to Self, Oriented to Place, Oriented to  Time, Oriented to Situation      Admission diagnosis:  Closed right hip fracture (HCC) [S72.001A] Closed fracture of right hip, initial encounter [facility]) [S72.001A] Patient Active Problem List   Diagnosis Date Noted   Closed right hip fracture (HCC) 10/28/2022   Fall at home, initial encounter 10/28/2022   History of COPD 10/28/2022   Allergic rhinitis 10/28/2022   Anemia of chronic disease 10/28/2022  Chronic rhinitis 08/06/2022   OSA (obstructive sleep apnea) 08/06/2022   Heart failure, acute diastolic (HCC) 07/21/2022   CKD stage 3a, GFR 45-59 ml/min (HCC) 07/21/2022   Thoracic aortic aneurysm (HCC) 07/21/2022   GERD (gastroesophageal reflux disease) 07/21/2022   Pancreatic lesion 07/21/2022   Macular degeneration 07/21/2022   Hypocalcemia 07/21/2022   Insomnia 07/21/2022   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   TIA (transient  ischemic attack) 12/31/2020   Chronic diastolic CHF (congestive heart failure) (HCC) 05/19/2019   Acute CHF (congestive heart failure) (HCC) 05/18/2019   Chronic hypoxic respiratory failure (HCC) 05/18/2019   COPD (chronic obstructive pulmonary disease) (HCC) 05/18/2019   History of cardiac pacemaker 05/18/2019   AAA (abdominal aortic aneurysm) 05/18/2019   Hyperlipidemia 05/18/2019   Abdominal pain in female 05/30/2015   Essential hypertension 05/30/2015   Crohn's disease (HCC) 05/30/2015   Gastrointestinal hemorrhage with melena 05/30/2015   Blood loss anemia 05/30/2015   Hyponatremia 05/30/2015   Kidney disease 05/30/2015   Normocytic anemia 05/30/2015   CAD S/P percutaneous coronary angioplasty 05/30/2015   Acute blood loss anemia 05/30/2015   Heme + stool    PCP:  [name], MD Pharmacy:   [facility] [address] [place] - [address] [place] [address] Phone: [phone] Fax: [phone]  [facility] [address] [place] [address] Phone: [phone] Fax: [phone]     Social Determinants of Health (SDOH) Social History: SDOH Screenings   Food Insecurity: No Food Insecurity (10/29/2022)  Housing: Low Risk  (10/29/2022)  Transportation Needs: No Transportation Needs (10/29/2022)  Tobacco Use: Medium Risk (10/30/2022)   SDOH Interventions:     Readmission Risk Interventions    07/24/2022    2:49 PM  Readmission Risk Prevention Plan  Transportation Screening Complete  PCP or Specialist Appt within 5-7 Days Complete  Home Care Screening Complete  Medication Review (RN CM) Complete

## 2022-11-01 NOTE — Plan of Care (Signed)
°  Problem: Clinical Measurements: °Goal: Respiratory complications will improve °Outcome: Progressing °  °Problem: Clinical Measurements: °Goal: Cardiovascular complication will be avoided °Outcome: Progressing °  °Problem: Activity: °Goal: Risk for activity intolerance will decrease °Outcome: Progressing °  °

## 2022-11-01 NOTE — Anesthesia Postprocedure Evaluation (Signed)
Anesthesia Post Note  Patient: Felicia Benson  Procedure(s) Performed: INTRAMEDULLARY nailing of right femur (Right)     Patient location during evaluation: PACU Anesthesia Type: Spinal Level of consciousness: oriented and awake and alert Pain management: pain level controlled Vital Signs Assessment: post-procedure vital signs reviewed and stable Respiratory status: spontaneous breathing, respiratory function stable and patient connected to nasal cannula oxygen Cardiovascular status: blood pressure returned to baseline and stable Postop Assessment: no headache, no backache and no apparent nausea or vomiting Anesthetic complications: no   No notable events documented.  Last Vitals:  Vitals:   11/01/22 0439 11/01/22 0538  BP: (!) 111/58 108/68  Pulse: 91 95  Resp: 16 18  Temp:  36.7 C  SpO2: 97% 92%    Last Pain:  Vitals:   11/01/22 0538  TempSrc: Axillary  PainSc: 0-No pain                 Sandor Arboleda S

## 2022-11-01 NOTE — Progress Notes (Signed)
Physical Therapy Treatment Patient Details Name: Felicia Benson MRN: [id number] DOB: [birth year] Today's Date: 11/01/2022   History of Present Illness Felicia Benson is a 89 y.o. female admitted to [facility] on 10/28/2022 with acute right hip fracture; s/p IMNail 10/22; with medical history significant for chronic hypoxic respiratory failure on baseline and 4 L continuous nasal cannula, COPD, chronic diastolic heart failure, essential pretension, hyperlipidemia anemia of chronic disease is a baseline hemoglobin range 10-12,    PT Comments  Pt continues to be limited by soft blood pressures that fall in sitting. Pt was able to sit EOB for less than 1 min before started to rest her head on physical therapist and assist at total A to supine. Pt was total A for bed mobility and has very limited mobility in the bil LE. Pt states that she is normally on 4 L O2 at home and her O2 sats are around 94%. Pt has been on 5-6 L O2 in hospital with O2 sats around 97%. Due to pt current functional status, PLOF, home set up and available assistance at home recommending skilled physical therapy services< 3 hours/day on discharge from acute care hospital setting in order to decrease risk for falls, injury, immobility, skin break down and re-hospitalization.     If plan is discharge home, recommend the following: Two people to help with walking and/or transfers;Assist for transportation   Can travel by private vehicle     No  Equipment Recommendations  Wheelchair (measurements PT);Wheelchair cushion (measurements PT);Hospital bed;Hoyer lift       Precautions / Restrictions Precautions Precautions: Fall;Other (comment) Precaution Comments: BPs soft, oxy mask Restrictions Weight Bearing Restrictions: Yes RLE Weight Bearing: Weight bearing as tolerated     Mobility  Bed Mobility Overal bed mobility: Needs Assistance Bed Mobility: Supine to Sit, Sit to Supine     Supine to sit: +2 for physical  assistance, Max assist Sit to supine: +2 for physical assistance, Max assist   General bed mobility comments: Total assist and heavy use of bedpad. Pt was able to help less thatn 25% with R and L LE and wiht LUE to pull herself to the R with the rail    Transfers     General transfer comment: unable at this time due to blood pressures.    Ambulation/Gait     General Gait Details: unable at this time.       Balance Overall balance assessment: Needs assistance Sitting-balance support: Bilateral upper extremity supported, Single extremity supported Sitting balance-Leahy Scale: Poor Sitting balance - Comments: r lateral lean that increased with time sitting. Pt was only able to sit for ~1 min Postural control: Right lateral lean        Cognition Arousal: Alert Behavior During Therapy: WFL for tasks assessed/performed Overall Cognitive Status: Impaired/Different from baseline         General Comments: some confusion that pt is aware of stated year was 2240. But when prompted by occupational therapist pt was able to state 2024           General Comments General comments (skin integrity, edema, etc.): Pt is on 6L O2 via oxymask with O2 sats 97%. PT BP in supine 89/60, sitting EOB 88/60 initially, pt started to fall toward the R and lean her head on therapist pt was assist at total A to supine with BP getting taken and BP 73/45. Pt was placed in trendelenburg and BP up to 90/47. Pt was positioned with HOB lower  than 30 degrees and BP 81/50. RN is aware      Pertinent Vitals/Pain Pain Assessment Pain Assessment: Faces Faces Pain Scale: Hurts whole lot Pain Location: R hip with motion Pain Descriptors / Indicators: Grimacing, Guarding Pain Intervention(s): Monitored during session, RN gave pain meds during session, Limited activity within patient's tolerance     PT Goals (current goals can now be found in the care plan section) Acute Rehab PT Goals Patient Stated Goal:  Pt would like to get back to PLOF PT Goal Formulation: With patient/family Time For Goal Achievement: 11/14/22 Potential to Achieve Goals: Fair Progress towards PT goals: Progressing toward goals    Frequency    Min 1X/week      PT Plan  Continue with current POC    Co-evaluation PT/OT/SLP Co-Evaluation/Treatment: Yes Reason for Co-Treatment: Complexity of the patient's impairments (multi-system involvement) PT goals addressed during session: Mobility/safety with mobility;Balance        AM-PAC PT "6 Clicks" Mobility   Outcome Measure  Help needed turning from your back to your side while in a flat bed without using bedrails?: Total Help needed moving from lying on your back to sitting on the side of a flat bed without using bedrails?: Total Help needed moving to and from a bed to a chair (including a wheelchair)?: Total Help needed standing up from a chair using your arms (e.g., wheelchair or bedside chair)?: Total Help needed to walk in hospital room?: Total Help needed climbing 3-5 steps with a railing? : Total 6 Click Score: 6    End of Session Equipment Utilized During Treatment: Oxygen;Gait belt Activity Tolerance: Treatment limited secondary to medical complications (Comment) (pt limited by BP) Patient left: in bed;with call bell/phone within reach;with bed alarm set Nurse Communication: Mobility status PT Visit Diagnosis: Unsteadiness on feet (R26.81);History of falling (Z91.81);Muscle weakness (generalized) (M62.81);Pain Pain - Right/Left: Right Pain - part of body: Hip     Time: 9528-4132 PT Time Calculation (min) (ACUTE ONLY): 36 min  Charges:    $Therapeutic Activity: 8-22 mins PT General Charges $$ ACUTE PT VISIT: 1 Visit                    Harrel Carina, DPT, CLT  Acute Rehabilitation Services Office: [phone] (Secure chat preferred)    Claudia Desanctis 11/01/2022, 11:46 AM

## 2022-11-01 NOTE — [place] FL2 (Signed)
[place] MEDICAID FL2 LEVEL OF CARE FORM     IDENTIFICATION  Patient Name: Felicia Benson Birthdate: [birth year] Sex: female Admission Date (Current Location): 10/28/2022  [facility] and [place] Number:  Producer, television/film/video and Address:  [facility] [place], [address] [place], [place] [address]      Provider Number: [id number]  Attending Physician Name and Address:  [place], Eric J, DO  Relative Name and Phone Number:  [name] Daughter [phone]    Current Level of Care: Hospital Recommended Level of Care: Skilled Nursing Facility Prior Approval Number:    Date Approved/Denied:   PASRR Number: [id number]  Discharge Plan: SNF    Current Diagnoses: Patient Active Problem List   Diagnosis Date Noted   Closed right hip fracture (HCC) 10/28/2022   Fall at home, initial encounter 10/28/2022   History of COPD 10/28/2022   Allergic rhinitis 10/28/2022   Anemia of chronic disease 10/28/2022   Chronic rhinitis 08/06/2022   OSA (obstructive sleep apnea) 08/06/2022   Heart failure, acute diastolic (HCC) 07/21/2022   CKD stage 3a, GFR 45-59 ml/min (HCC) 07/21/2022   Thoracic aortic aneurysm (HCC) 07/21/2022   GERD (gastroesophageal reflux disease) 07/21/2022   Pancreatic lesion 07/21/2022   Macular degeneration 07/21/2022   Hypocalcemia 07/21/2022   Insomnia 07/21/2022   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   TIA (transient ischemic attack) 12/31/2020   Chronic diastolic CHF (congestive heart failure) (HCC) 05/19/2019   Acute CHF (congestive heart failure) (HCC) 05/18/2019   Chronic hypoxic respiratory failure (HCC) 05/18/2019   COPD (chronic obstructive pulmonary disease) (HCC) 05/18/2019   History of cardiac pacemaker 05/18/2019   AAA (abdominal aortic aneurysm) 05/18/2019   Hyperlipidemia 05/18/2019   Abdominal pain in female 05/30/2015   Essential hypertension 05/30/2015   Crohn's disease (HCC) 05/30/2015   Gastrointestinal  hemorrhage with melena 05/30/2015   Blood loss anemia 05/30/2015   Hyponatremia 05/30/2015   Kidney disease 05/30/2015   Normocytic anemia 05/30/2015   CAD S/P percutaneous coronary angioplasty 05/30/2015   Acute blood loss anemia 05/30/2015   Heme + stool     Orientation RESPIRATION BLADDER Height & Weight     Self, Time, Situation, Place  O2 Continent, External catheter Weight: 190 lb 11.2 oz (86.5 kg) Height:  5\' 6"  (167.6 cm)  BEHAVIORAL SYMPTOMS/MOOD NEUROLOGICAL BOWEL NUTRITION STATUS      Continent Diet (see discharge summary)  AMBULATORY STATUS COMMUNICATION OF NEEDS Skin   Total Care Verbally Surgical wounds, Other (Comment) (redness, ecchymosis)                       Personal Care Assistance Level of Assistance  Bathing, Feeding, Dressing Bathing Assistance: Maximum assistance Feeding assistance: Limited assistance Dressing Assistance: Maximum assistance     Functional Limitations Info  Sight, Hearing, Speech Sight Info: Impaired Hearing Info: Impaired Speech Info: Adequate    SPECIAL CARE FACTORS FREQUENCY  PT (By licensed PT), OT (By licensed OT)     PT Frequency: 5x week OT Frequency: 5x week            Contractures Contractures Info: Not present    Additional Factors Info  Code Status, Allergies Code Status Info: DNR Allergies Info: Ibuprofen, Ace Inhibitors, Breztri Aerosphere (Budeson-glycopyrrol-formoterol), Ciprofloxacin, Levofloxacin, Nsaids           Current Medications (11/01/2022):  This is the current hospital active medication list Current Facility-Administered Medications  Medication Dose Route Frequency Provider Last Rate Last Admin  acetaminophen (TYLENOL) tablet 650 mg  650 mg Oral Q6H PRN Kathie Dike M, PA-C       Or   acetaminophen (TYLENOL) suppository 650 mg  650 mg Rectal Q6H PRN Kathie Dike M, PA-C       albuterol (PROVENTIL) (2.5 MG/3ML) 0.083% nebulizer solution 2.5 mg  2.5 mg Nebulization Q4H PRN  Kathie Dike M, PA-C   2.5 mg at 10/30/22 1956   alum & mag hydroxide-simeth (MAALOX/MYLANTA) 200-200-20 MG/5ML suspension 30 mL  30 mL Oral Q4H PRN Cecil Cobbs, PA-C   30 mL at 10/29/22 1745   atorvastatin (LIPITOR) tablet 40 mg  40 mg Oral QPM Kathie Dike M, PA-C   40 mg at 10/31/22 1653   enoxaparin (LOVENOX) injection 30 mg  30 mg Subcutaneous Q24H [place], Alvira Philips, DO   30 mg at 11/01/22 0830   fluticasone (FLONASE) 50 MCG/ACT nasal spray 2 spray  2 spray Each Nare Daily Cecil Cobbs, PA-C   2 spray at 11/01/22 8657   HYDROmorphone (DILAUDID) injection 0.5 mg  0.5 mg Intravenous Q2H PRN Cecil Cobbs, PA-C   0.5 mg at 10/31/22 0033   melatonin tablet 3 mg  3 mg Oral QHS PRN Kathie Dike M, PA-C       metoprolol tartrate (LOPRESSOR) tablet 75 mg  75 mg Oral BID Kathie Dike M, PA-C   75 mg at 11/01/22 8469   mometasone-formoterol (DULERA) 100-5 MCG/ACT inhaler 2 puff  2 puff Inhalation BID Cecil Cobbs, PA-C   2 puff at 11/01/22 0824   montelukast (SINGULAIR) tablet 10 mg  10 mg Oral QPM Kathie Dike M, PA-C   10 mg at 10/31/22 1653   naloxone (NARCAN) injection 0.4 mg  0.4 mg Intravenous PRN Kathie Dike M, PA-C       ondansetron [facility]) injection 4 mg  4 mg Intravenous Q6H PRN Kathie Dike M, PA-C   4 mg at 10/30/22 1734   oxyCODONE (Oxy IR/ROXICODONE) immediate release tablet 5-7.5 mg  5-7.5 mg Oral Q4H PRN Kathie Dike M, PA-C   5 mg at 11/01/22 0010   pantoprazole (PROTONIX) EC tablet 40 mg  40 mg Oral Daily [place], Eric J, DO   40 mg at 11/01/22 0830   umeclidinium bromide (INCRUSE ELLIPTA) 62.5 MCG/ACT 1 puff  1 puff Inhalation Daily Cecil Cobbs, PA-C   1 puff at 11/01/22 6295     Discharge Medications: Please see discharge summary for a list of discharge medications.  Relevant Imaging Results:  Relevant Lab Results:   Additional Information SSN: [id number]  Lorri Frederick, LCSW

## 2022-11-02 ENCOUNTER — Inpatient Hospital Stay (HOSPITAL_COMMUNITY): Payer: Medicare Other

## 2022-11-02 DIAGNOSIS — S72001A Fracture of unspecified part of neck of right femur, initial encounter for closed fracture: Secondary | ICD-10-CM | POA: Diagnosis not present

## 2022-11-02 LAB — MAGNESIUM: Magnesium: 2.1 mg/dL (ref 1.7–2.4)

## 2022-11-02 LAB — CBC
HCT: 27 % — ABNORMAL LOW (ref 36.0–46.0)
Hemoglobin: 8.8 g/dL — ABNORMAL LOW (ref 12.0–15.0)
MCH: 28.7 pg (ref 26.0–34.0)
MCHC: 32.6 g/dL (ref 30.0–36.0)
MCV: 87.9 fL (ref 80.0–100.0)
Platelets: 179 10*3/uL (ref 150–400)
RBC: 3.07 MIL/uL — ABNORMAL LOW (ref 3.87–5.11)
RDW: 16.8 % — ABNORMAL HIGH (ref 11.5–15.5)
WBC: 13.2 10*3/uL — ABNORMAL HIGH (ref 4.0–10.5)
nRBC: 0.2 % (ref 0.0–0.2)

## 2022-11-02 LAB — BASIC METABOLIC PANEL
Anion gap: 11 (ref 5–15)
BUN: 50 mg/dL — ABNORMAL HIGH (ref 8–23)
CO2: 24 mmol/L (ref 22–32)
Calcium: 7.5 mg/dL — ABNORMAL LOW (ref 8.9–10.3)
Chloride: 94 mmol/L — ABNORMAL LOW (ref 98–111)
Creatinine, Ser: 1.49 mg/dL — ABNORMAL HIGH (ref 0.44–1.00)
GFR, Estimated: 33 mL/min — ABNORMAL LOW (ref >60)
Glucose, Bld: 149 mg/dL — ABNORMAL HIGH (ref 70–99)
Potassium: 3.9 mmol/L (ref 3.5–5.1)
Sodium: 129 mmol/L — ABNORMAL LOW (ref 135–145)

## 2022-11-02 LAB — PROCALCITONIN: Procalcitonin: 0.31 ng/mL

## 2022-11-02 MED ORDER — METOPROLOL TARTRATE 50 MG PO TABS
50.0000 mg | ORAL_TABLET | Freq: Two times a day (BID) | ORAL | Status: DC
  Administered 2022-11-02: 50 mg via ORAL
  Filled 2022-11-02: qty 1

## 2022-11-02 MED ORDER — SODIUM CHLORIDE 0.9 % IV BOLUS
500.0000 mL | Freq: Once | INTRAVENOUS | Status: AC
  Administered 2022-11-02: 500 mL via INTRAVENOUS

## 2022-11-02 MED ORDER — METOPROLOL TARTRATE 5 MG/5ML IV SOLN
5.0000 mg | Freq: Once | INTRAVENOUS | Status: AC
  Administered 2022-11-02: 5 mg via INTRAVENOUS
  Filled 2022-11-02: qty 5

## 2022-11-02 NOTE — Progress Notes (Signed)
Physical Therapy Treatment Patient Details Name: Felicia Benson MRN: [id number] DOB: [birth year] Today's Date: 11/02/2022   History of Present Illness Pt is a 89 y/o female presenting on 10/20 after fall.  Found with R hip fx, s/p IM nail 10/22.  PMH includes: chronic hypoxic respiratory failure on baseline and 4 L continuous nasal cannula, COPD, chronic diastolic heart failure, essential pretension, hyperlipidemia anemia of chronic disease is a baseline hemoglobin range 10-12.    PT Comments  Pt slowly progressing. Improved BP this session; pt was limited by O2 sats this session. Pt continues at Total A for bed mobility including rolling and supine<>sitting. Pt is able to help with moving the LLE and with turning toward the R for sitting EOB less than 25%. Due to pt current functional status, PLOF, home set up and available assistance at home recommending skilled physical therapy services< 3 hours/day on discharge from acute care hospital setting in order to decrease risk for falls, injury, immobility, skin break down and re-hospitalization.  Pt O2 sats dropped to 72% on [place] and HR up to 132 bpm pt became very fatigued in sitting and had to be assisted back to supine.    If plan is discharge home, recommend the following: Two people to help with walking and/or transfers;Assist for transportation   Can travel by private vehicle     No  Equipment Recommendations  Wheelchair (measurements PT);Wheelchair cushion (measurements PT);Hospital bed;Hoyer lift       Precautions / Restrictions Precautions Precautions: Fall;Other (comment) Precaution Comments: BPs soft, oxygen Restrictions Weight Bearing Restrictions: Yes RLE Weight Bearing: Weight bearing as tolerated     Mobility  Bed Mobility Overal bed mobility: Needs Assistance Bed Mobility: Supine to Sit, Sit to Supine     Supine to sit: +2 for physical assistance, Max assist Sit to supine: +2 for physical assistance, Max assist    General bed mobility comments: overall total assist using bed pad to/from EOB, able to assist with UEs minimally on rails    Transfers   General transfer comment: unable at this time due to O2 sats    Ambulation/Gait     General Gait Details: unable at this time.       Balance Overall balance assessment: Needs assistance Sitting-balance support: Bilateral upper extremity supported, Single extremity supported Sitting balance-Leahy Scale: Fair Sitting balance - Comments: improved mid line sitting. Due to O2 sats started to lean. Able to sit for ~ 1.5 min            Cognition Arousal: Alert Behavior During Therapy: [facility] for tasks assessed/performed Overall Cognitive Status: Impaired/Different from baseline             General Comments General comments (skin integrity, edema, etc.): Supine BP 112/81, Sitting 115/70 , back in supine 116/67. Pt O2 sats dropping down to 72% pt changed from [place] to oxymask continued with Low O2 saturations and HR up to 132 bpm.      Pertinent Vitals/Pain Pain Assessment Pain Assessment: Faces Faces Pain Scale: Hurts whole lot Pain Location: R hip with motion Pain Descriptors / Indicators: Grimacing, Guarding Pain Intervention(s): Monitored during session, Limited activity within patient's tolerance, Repositioned     PT Goals (current goals can now be found in the care plan section) Acute Rehab PT Goals Patient Stated Goal: Pt would like to get back to PLOF PT Goal Formulation: With patient/family Time For Goal Achievement: 11/14/22 Potential to Achieve Goals: Fair Progress towards PT goals: Progressing toward goals  Frequency    Min 1X/week      PT Plan  Continue with current POC       AM-PAC PT "6 Clicks" Mobility   Outcome Measure  Help needed turning from your back to your side while in a flat bed without using bedrails?: Total Help needed moving from lying on your back to sitting on the side of a flat bed without  using bedrails?: Total Help needed moving to and from a bed to a chair (including a wheelchair)?: Total Help needed standing up from a chair using your arms (e.g., wheelchair or bedside chair)?: Total Help needed to walk in hospital room?: Total Help needed climbing 3-5 steps with a railing? : Total 6 Click Score: 6    End of Session Equipment Utilized During Treatment: Oxygen;Gait belt Activity Tolerance: Treatment limited secondary to medical complications (Comment) (pt limited by BP) Patient left: in bed;with call bell/phone within reach;with bed alarm set Nurse Communication: Mobility status PT Visit Diagnosis: Unsteadiness on feet (R26.81);History of falling (Z91.81);Muscle weakness (generalized) (M62.81);Pain Pain - Right/Left: Right Pain - part of body: Hip     Time: 1610-9604 PT Time Calculation (min) (ACUTE ONLY): 33 min  Charges:    $Therapeutic Activity: 23-37 mins PT General Charges $$ ACUTE PT VISIT: 1 Visit                    Harrel Carina, DPT, CLT  Acute Rehabilitation Services Office: [phone] (Secure chat preferred)    Claudia Desanctis 11/02/2022, 4:06 PM

## 2022-11-02 NOTE — Progress Notes (Signed)
Patient has elevated HR 120-130's and RR 22-24 at rest, bringing patient MEWS to Red and then yellow. MD made aware, new orders placed for metoprolol IV.

## 2022-11-02 NOTE — Progress Notes (Signed)
PROGRESS NOTE    Felicia Benson  VWU:[id number] DOB: October 08, 1930 DOA: 10/28/2022 PCP: [name], MD    Brief Narrative:   Felicia Benson is a 89 y.o. female with past medical history significant for chronic respiratory failure/COPD on 4 L nasal cannula at baseline, chronic diastolic congestive heart failure, HTN, HLD, anemia of chronic medical disease who presented to [facility] ED on 10/20 from home via EMS complaining of right hip and knee pain following mechanical fall.  Denies loss of consciousness or striking his head.  Patient reported immediate pain to his right hip region following fall with inability to move his right lower extremity.  Denies headache, blurred vision, chest pain, no shortness of breath, no palpitations, no N/V/D, no dizziness, no syncope versus presyncopal episode.  Takes baby aspirin outpatient, otherwise not on any additional blood thinners.  In the ED, temperature 97.6 F, HR 72, RR 15, BP 135/68, SpO2 92% on 4 L nasal cannula.  WBC 13.9, hemoglobin 9.7, platelet count 268.  Sodium 141, potassium 3.5, chloride 101, CO2 26, glucose 125, BUN 23, creatinine 1.07, AST 19, ALT 14, total bili 1.0.  Right hip/pelvis with acute displaced right intertrochanteric femoral fracture.  Right knee x-ray with no acute fracture/dislocation, soft tissue swelling inferior to patella likely representative contusion.  Chest x-ray with emphysematous and chronic bronchitic changes of the lungs, no focal consolidation.  CT head/C-spine with no acute intracranial abnormality, no displaced fracture, noted postoperative anterior fixation/fusion C3-C5, stable heterogeneous enlargement of right thyroid and isthmus likely indicative of multinodular goiter.  Orthopedics was consulted.  TRH consulted for admission for further evaluation and management of right displaced intertrochanteric femoral fracture.  Assessment & Plan:   Displaced right intertrochanteric femoral fracture Patient presenting to ED  with right hip pain with inability ambulate following mechanical fall at home.  Imaging notable for acute displaced right intertrochanteric femoral fracture.  Orthopedics was consulted and patient underwent ORIF by Dr. Blanchie Dessert on 10/30/2022 -- Orthopedics following, appreciate assistance -- Vitamin D 25-hydroxy level: 87.66 -- WBAT RLE -- Lovenox for postoperative DVT prophylaxis -- PT/OT evaluation: Pending -- 2-week postoperative follow-up with orthopedics for wound check  Acute renal failure Etiology likely secondary to dehydration from prolonged n.p.o. status while awaiting operative management as above. -- Cr 1.21>1.07>2.02>1.95>1.82>1.49 -- Hold home losartan due to AKI as above -- Continue to encourage increased oral intake -- repeat BMP in am  Chronic respiratory failure/COPD on home O2 Baseline oxygen requirement 4 L per nasal cannula.  Chest x-ray on admission with noted emphysematous/chronic bronchitic changes with no focal consolidation. -- Dulera 2 puffs twice daily -- Incruse Ellipta 1 puff daily -- Montelukast 10 mg p.o. nightly --Continue supplemental oxygen, maintain SpO2 greater than 88%  Chronic diastolic congestive heart failure, compensated Essential hypertension -- Hold home amlodipine, metoprolol and losartan for now -- repeat 500 mL NS bolus today -- Continue monitor BP  Hyperlipidemia -- Atorvastatin 40 mg p.o. daily  Anemia of chronic medical disease -- Hgb 10.4>9.7>7.9>9.4>8.8 -- transfuse for hemoglobin <7.0 -- Repeat CBC in a.m.   DVT prophylaxis: enoxaparin (LOVENOX) injection 30 mg Start: 11/01/22 0900 SCDs Start: 10/28/22 2232    Code Status: Limited: Do not attempt resuscitation (DNR) -DNR-LIMITED -Do Not Intubate/DNI  Family Communication: No family present at bedside this morning  Disposition Plan:  Level of care: Med-Surg Status is: Inpatient Remains inpatient appropriate because: Pending SNF placement    Consultants:   Orthopedics, Dr. Blanchie Dessert  Procedures:  ORIF/IMN for right hip  fracture; Dr. Blanchie Dessert; 10/30/2022  Antimicrobials:  Perioperative cefazolin   Subjective: Patient's examined bedside, resting comfortably.  Lying in bed.  Overall feels improved, as well as pain improving.  Was unable to perform much with physical therapy yesterday but feels like she will be able to do little more today.   No other specific questions or concerns at this time.  Denies headache, no visual changes, no chest pain, no palpitations, no shortness of breath, no abdominal pain, no fever/chills/night sweats, no focal weakness, no fatigue, no paresthesias.  No acute events overnight per nursing staff.  Objective: Vitals:   11/02/22 0458 11/02/22 0729 11/02/22 0854 11/02/22 0855  BP: 111/67 93/68    Pulse: (!) 104 (!) 112    Resp: 18 20    Temp:  98.4 F (36.9 C)    TempSrc:  Axillary    SpO2: 98% 100% 97% 99%  Weight:      Height:        Intake/Output Summary (Last 24 hours) at 11/02/2022 1112 Last data filed at 11/02/2022 0631 Gross per 24 hour  Intake --  Output 800 ml  Net -800 ml    Filed Weights   10/28/22 1940 10/30/22 0500 10/30/22 1523  Weight: 84.4 kg 86.5 kg 86.5 kg    Examination:  Physical Exam: GEN: NAD, alert and oriented x 3, wd/wn HEENT: NCAT, PERRL, EOMI, sclera clear, MMM PULM: CTAB w/o wheezes/crackles, normal respiratory effort CV: RRR w/o M/G/R GI: abd soft, NTND, NABS, no R/G/M MSK: no peripheral edema, surgical dressing noted in place, clean/dry/intact NEURO: No focal neurological deficits PSYCH: normal mood/affect Integumentary: Right lower extremity surgical dressing as above, otherwise no concerning rashes/lesions/wounds noted exposed skin surfaces    Data Reviewed: I have personally reviewed following labs and imaging studies  CBC: Recent Labs  Lab 10/28/22 1919 10/29/22 0254 10/30/22 0747 10/31/22 0541 11/01/22 1308 11/02/22 0744  WBC 8.8 13.9* 15.0*  13.4* 12.8* 13.2*  NEUTROABS 5.4 11.1* 12.5*  --   --   --   HGB 10.4* 9.7* 7.9* 9.4* 8.8* 8.8*  HCT 34.4* 33.0* 25.3* 28.9* 27.3* 27.0*  MCV 91.2 95.7 89.4 87.8 88.3 87.9  PLT 272 268 187 166 173 179   Basic Metabolic Panel: Recent Labs  Lab 10/28/22 2331 10/29/22 0254 10/30/22 0747 10/31/22 0541 11/01/22 0816 11/02/22 0744  NA  --  141 139 133* 132* 129*  K  --  3.5 3.5 3.8 4.0 3.9  CL  --  101 96* 97* 97* 94*  CO2  --  26 28 27 24 24   GLUCOSE  --  125* 111* 126* 126* 149*  BUN  --  23 37* 42* 51* 50*  CREATININE  --  1.07* 2.02* 1.95* 1.82* 1.49*  CALCIUM  --  8.5* 8.8* 7.7* 7.7* 7.5*  MG 1.5* 1.5*  --  2.2  --  2.1   GFR: Estimated Creatinine Clearance: 27.8 mL/min (A) (by C-G formula based on SCr of 1.49 mg/dL (H)). Liver Function Tests: Recent Labs  Lab 10/29/22 0254  AST 19  ALT 14  ALKPHOS 68  BILITOT 1.0  PROT 6.1*  ALBUMIN 2.6*   No results for input(s): "LIPASE", "AMYLASE" in the last 168 hours. No results for input(s): "AMMONIA" in the last 168 hours. Coagulation Profile: Recent Labs  Lab 10/29/22 0254  INR 1.1   Cardiac Enzymes: No results for input(s): "CKTOTAL", "CKMB", "CKMBINDEX", "TROPONINI" in the last 168 hours. BNP (last 3 results) No results for input(s): "PROBNP" in the  last 8760 hours. HbA1C: No results for input(s): "HGBA1C" in the last 72 hours. CBG: No results for input(s): "GLUCAP" in the last 168 hours. Lipid Profile: No results for input(s): "CHOL", "HDL", "LDLCALC", "TRIG", "CHOLHDL", "LDLDIRECT" in the last 72 hours. Thyroid Function Tests: No results for input(s): "TSH", "T4TOTAL", "FREET4", "T3FREE", "THYROIDAB" in the last 72 hours. Anemia Panel: Recent Labs    10/30/22 2229  VITAMINB12 235  FERRITIN 48  TIBC 295  IRON 80   Sepsis Labs: No results for input(s): "PROCALCITON", "LATICACIDVEN" in the last 168 hours.  Recent Results (from the past 240 hour(s))  Surgical pcr screen     Status: None   Collection  Time: 10/30/22 12:42 PM   Specimen: Nasal Mucosa; Nasal Swab  Result Value Ref Range Status   MRSA, PCR NEGATIVE NEGATIVE Final   Staphylococcus aureus NEGATIVE NEGATIVE Final    Comment: (NOTE) The Xpert SA Assay (FDA approved for NASAL specimens in patients 65 years of age and older), is one component of a comprehensive surveillance program. It is not intended to diagnose infection nor to guide or monitor treatment. Performed at [facility], [address], [place], [place] [address]          Radiology Studies: DG CHEST PORT 1 VIEW  Result Date: 11/02/2022 CLINICAL DATA:  Shortness of breath. EXAM: PORTABLE CHEST 1 VIEW COMPARISON:  10/28/2022. FINDINGS: Bilateral lungs appear hyperlucent with coarse bronchovascular markings, concerning for underlying COPD. There are slightly increased interstitial markings when compared to the prior exam, concerning for mild underlying pulmonary edema. There are atelectatic changes/scarring at the lung bases. Subtle blunting of left lateral costophrenic angle is similar to the prior study and may represent trace pleural effusion versus overlying soft tissue. Bilateral lungs otherwise appear clear. Stable cardio-mediastinal silhouette. There is a left sided 2-lead pacemaker. No acute osseous abnormalities. Partially seen lower cervical spinal fixation hardware. There are surgical staples along the left inferomedial neck region. The soft tissues are within normal limits. IMPRESSION: *Findings concerning for COPD. There is probable superimposed mild pulmonary edema. *Blunting of left lateral costophrenic angle is similar to the prior study and may represent trace pleural effusion versus overlying soft tissue. Electronically Signed   By: Jules Schick M.D.   On: 11/02/2022 08:19        Scheduled Meds:  atorvastatin  40 mg Oral QPM   enoxaparin (LOVENOX) injection  30 mg Subcutaneous Q24H   fluticasone  2 spray Each Nare Daily    mometasone-formoterol  2 puff Inhalation BID   montelukast  10 mg Oral QPM   pantoprazole  40 mg Oral Daily   senna-docusate  1 tablet Oral BID   umeclidinium bromide  1 puff Inhalation Daily   Continuous Infusions:     LOS: 5 days    Time spent: 51 minutes spent on chart review, discussion with nursing staff, consultants, updating family and interview/physical exam; more than 50% of that time was spent in counseling and/or coordination of care.    Alvira Philips [place], DO [facility] Available via Epic secure chat 7am-7pm After these hours, please refer to coverage provider listed on [email or web address] 11/02/2022, 11:12 AM

## 2022-11-02 NOTE — TOC Progression Note (Signed)
Transition of Care [facility]) - Progression Note    Patient Details  Name: Felicia Benson MRN: [id number] Date of Birth: 09-10-1930  Transition of Care [facility]) CM/SW Contact  Lorri Frederick, LCSW Phone Number: 11/02/2022, 2:30 PM  Clinical Narrative:   CSW spoke with daughter Alvis Lemmings, she does want to accept offer at [facility].  CSW confirmed with [facility]: they can receive pt on Saturday.      Expected Discharge Plan: Skilled Nursing Facility Barriers to Discharge: Continued Medical Work up, SNF Pending bed offer  Expected Discharge Plan and Services In-house Referral: Clinical Social Work   Post Acute Care Choice: Skilled Nursing Facility Living arrangements for the past 2 months: Single Family Home                                       Social Determinants of Health (SDOH) Interventions SDOH Screenings   Food Insecurity: No Food Insecurity (10/29/2022)  Housing: Low Risk  (10/29/2022)  Transportation Needs: No Transportation Needs (10/29/2022)  Tobacco Use: Medium Risk (10/30/2022)    Readmission Risk Interventions    07/24/2022    2:49 PM  Readmission Risk Prevention Plan  Transportation Screening Complete  PCP or Specialist Appt within 5-7 Days Complete  Home Care Screening Complete  Medication Review (RN CM) Complete

## 2022-11-03 ENCOUNTER — Inpatient Hospital Stay (HOSPITAL_COMMUNITY): Payer: Medicare Other

## 2022-11-03 DIAGNOSIS — S72001A Fracture of unspecified part of neck of right femur, initial encounter for closed fracture: Secondary | ICD-10-CM | POA: Diagnosis not present

## 2022-11-03 MED ORDER — METOPROLOL TARTRATE 50 MG PO TABS
75.0000 mg | ORAL_TABLET | Freq: Two times a day (BID) | ORAL | Status: DC
  Administered 2022-11-03 – 2022-11-07 (×7): 75 mg via ORAL
  Filled 2022-11-03 (×8): qty 1

## 2022-11-03 MED ORDER — IOHEXOL 350 MG/ML SOLN
60.0000 mL | Freq: Once | INTRAVENOUS | Status: AC | PRN
  Administered 2022-11-03: 60 mL via INTRAVENOUS

## 2022-11-03 MED ORDER — IOHEXOL 9 MG/ML PO SOLN
500.0000 mL | ORAL | Status: AC
  Administered 2022-11-03 (×2): 500 mL via ORAL

## 2022-11-03 MED ORDER — SORBITOL 70 % SOLN
960.0000 mL | TOPICAL_OIL | Freq: Once | ORAL | Status: DC
  Filled 2022-11-03: qty 240

## 2022-11-03 NOTE — Plan of Care (Signed)

## 2022-11-03 NOTE — Progress Notes (Signed)
PROGRESS NOTE    [name]  BMW:[id number] DOB: [birth year] DOA: 10/28/2022 PCP: [name], MD    Brief Narrative:   Felicia Benson is a 89 y.o. female with past medical history significant for chronic respiratory failure/COPD on 4 L nasal cannula at baseline, chronic diastolic congestive heart failure, HTN, HLD, anemia of chronic medical disease who presented to [facility] ED on 10/20 from home via EMS complaining of right hip and knee pain following mechanical fall.  Denies loss of consciousness or striking his head.  Patient reported immediate pain to his right hip region following fall with inability to move his right lower extremity.  Denies headache, blurred vision, chest pain, no shortness of breath, no palpitations, no N/V/D, no dizziness, no syncope versus presyncopal episode.  Takes baby aspirin outpatient, otherwise not on any additional blood thinners.  In the ED, temperature 97.6 F, HR 72, RR 15, BP 135/68, SpO2 92% on 4 L nasal cannula.  WBC 13.9, hemoglobin 9.7, platelet count 268.  Sodium 141, potassium 3.5, chloride 101, CO2 26, glucose 125, BUN 23, creatinine 1.07, AST 19, ALT 14, total bili 1.0.  Right hip/pelvis with acute displaced right intertrochanteric femoral fracture.  Right knee x-ray with no acute fracture/dislocation, soft tissue swelling inferior to patella likely representative contusion.  Chest x-ray with emphysematous and chronic bronchitic changes of the lungs, no focal consolidation.  CT head/C-spine with no acute intracranial abnormality, no displaced fracture, noted postoperative anterior fixation/fusion C3-C5, stable heterogeneous enlargement of right thyroid and isthmus likely indicative of multinodular goiter.  Orthopedics was consulted.  TRH consulted for admission for further evaluation and management of right displaced intertrochanteric femoral fracture.  Assessment & Plan:   Displaced right intertrochanteric femoral fracture Patient presenting to ED  with right hip pain with inability ambulate following mechanical fall at home.  Imaging notable for acute displaced right intertrochanteric femoral fracture.  Orthopedics was consulted and patient underwent ORIF by Dr. Blanchie Dessert on 10/30/2022 -- Orthopedics following, appreciate assistance -- Vitamin D 25-hydroxy level: 87.66 -- WBAT RLE -- Lovenox for postoperative DVT prophylaxis -- PT/OT evaluation: Pending -- 2-week postoperative follow-up with orthopedics for wound check  Abdominal pain, nausea/vomiting Overnight, patient with 1 episode of emesis while eating dinner.  Also with mild generalized abdominal discomfort.  No reported bowel movement for several days despite stool softeners/laxatives.  Patient does endorse history of small bowel obstruction 1 year ago treated conservatively by NG tube decompression with resolution.  No previous history of intra-abdominal surgeries. -- Check abdominal x-ray -- Enema -- If continues to be symptomatic, will likely need further imaging with CT abdomen/pelvis  Acute renal failure Etiology likely secondary to dehydration from prolonged n.p.o. status while awaiting operative management as above. -- Cr 1.21>1.07>2.02>1.95>1.82>1.49 -- Hold home losartan due to AKI as above -- Continue to encourage increased oral intake -- repeat BMP in am  Chronic respiratory failure/COPD on home O2 Baseline oxygen requirement 4 L per nasal cannula.  Chest x-ray on admission with noted emphysematous/chronic bronchitic changes with no focal consolidation. -- Dulera 2 puffs twice daily -- Incruse Ellipta 1 puff daily -- Montelukast 10 mg p.o. nightly --Continue supplemental oxygen, maintain SpO2 greater than 88%  Chronic diastolic congestive heart failure, compensated Essential hypertension -- Hold home amlodipine, metoprolol and losartan for now -- repeat 500 mL NS bolus today -- Continue monitor BP  Hyperlipidemia -- Atorvastatin 40 mg p.o. daily  Anemia  of chronic medical disease -- Hgb 10.4>9.7>7.9>9.4>8.8 -- transfuse for hemoglobin <7.0 --  Repeat CBC in a.m.   DVT prophylaxis: enoxaparin (LOVENOX) injection 30 mg Start: 11/01/22 0900 SCDs Start: 10/28/22 2232    Code Status: Limited: Do not attempt resuscitation (DNR) -DNR-LIMITED -Do Not Intubate/DNI  Family Communication: No family present at bedside this morning  Disposition Plan:  Level of care: Med-Surg Status is: Inpatient Remains inpatient appropriate because: Pending SNF placement    Consultants:  Orthopedics, Dr. Blanchie Dessert  Procedures:  ORIF/IMN for right hip fracture; Dr. Blanchie Dessert; 10/30/2022  Antimicrobials:  Perioperative cefazolin   Subjective: Patient's examined bedside, resting comfortably.  Lying in bed.  Reports mild generalized abdominal pain.  Endorses 1 episode of emesis last night while eating dinner.  Denies bowel movements over the last several days despite stool softener/laxative.  Requesting enema.  Will check abdominal x-ray.  Does endorse history of small bowel obstruction 1 year ago relieved with NG tube decompression.  No previous history of intra-abdominal surgeries.  No other specific questions or concerns at this time.  Denies headache, no visual changes, no chest pain, no palpitations, no shortness of breath, no abdominal pain, no fever/chills/night sweats, no focal weakness, no fatigue, no paresthesias.  No other acute events overnight per nursing staff.  Objective: Vitals:   11/03/22 0429 11/03/22 0500 11/03/22 0736 11/03/22 0830  BP: 125/80  114/78   Pulse: (!) 109  83   Resp: 20     Temp: 98.2 F (36.8 C)  98 F (36.7 C)   TempSrc:   Oral   SpO2: 94%  96% 97%  Weight:  94 kg    Height:        Intake/Output Summary (Last 24 hours) at 11/03/2022 1050 Last data filed at 11/02/2022 2017 Gross per 24 hour  Intake --  Output 350 ml  Net -350 ml    Filed Weights   10/30/22 0500 10/30/22 1523 11/03/22 0500  Weight: 86.5 kg  86.5 kg 94 kg    Examination:  Physical Exam: GEN: NAD, alert and oriented x 3, wd/wn HEENT: NCAT, PERRL, EOMI, sclera clear, MMM PULM: CTAB w/o wheezes/crackles, normal respiratory effort CV: RRR w/o M/G/R GI: abd soft, ND, mild TTP, + BS MSK: no peripheral edema, surgical dressing noted in place, clean/dry/intact NEURO: No focal neurological deficits PSYCH: normal mood/affect Integumentary: Right lower extremity surgical dressing as above, otherwise no concerning rashes/lesions/wounds noted exposed skin surfaces    Data Reviewed: I have personally reviewed following labs and imaging studies  CBC: Recent Labs  Lab 10/28/22 1919 10/29/22 0254 10/30/22 0747 10/31/22 0541 11/01/22 1308 11/02/22 0744  WBC 8.8 13.9* 15.0* 13.4* 12.8* 13.2*  NEUTROABS 5.4 11.1* 12.5*  --   --   --   HGB 10.4* 9.7* 7.9* 9.4* 8.8* 8.8*  HCT 34.4* 33.0* 25.3* 28.9* 27.3* 27.0*  MCV 91.2 95.7 89.4 87.8 88.3 87.9  PLT 272 268 187 166 173 179   Basic Metabolic Panel: Recent Labs  Lab 10/28/22 2331 10/29/22 0254 10/30/22 0747 10/31/22 0541 11/01/22 0816 11/02/22 0744  NA  --  141 139 133* 132* 129*  K  --  3.5 3.5 3.8 4.0 3.9  CL  --  101 96* 97* 97* 94*  CO2  --  26 28 27 24 24   GLUCOSE  --  125* 111* 126* 126* 149*  BUN  --  23 37* 42* 51* 50*  CREATININE  --  1.07* 2.02* 1.95* 1.82* 1.49*  CALCIUM  --  8.5* 8.8* 7.7* 7.7* 7.5*  MG 1.5* 1.5*  --  2.2  --  2.1   GFR: Estimated Creatinine Clearance: 29 mL/min (A) (by C-G formula based on SCr of 1.49 mg/dL (H)). Liver Function Tests: Recent Labs  Lab 10/29/22 0254  AST 19  ALT 14  ALKPHOS 68  BILITOT 1.0  PROT 6.1*  ALBUMIN 2.6*   No results for input(s): "LIPASE", "AMYLASE" in the last 168 hours. No results for input(s): "AMMONIA" in the last 168 hours. Coagulation Profile: Recent Labs  Lab 10/29/22 0254  INR 1.1   Cardiac Enzymes: No results for input(s): "CKTOTAL", "CKMB", "CKMBINDEX", "TROPONINI" in the last 168  hours. BNP (last 3 results) No results for input(s): "PROBNP" in the last 8760 hours. HbA1C: No results for input(s): "HGBA1C" in the last 72 hours. CBG: No results for input(s): "GLUCAP" in the last 168 hours. Lipid Profile: No results for input(s): "CHOL", "HDL", "LDLCALC", "TRIG", "CHOLHDL", "LDLDIRECT" in the last 72 hours. Thyroid Function Tests: No results for input(s): "TSH", "T4TOTAL", "FREET4", "T3FREE", "THYROIDAB" in the last 72 hours. Anemia Panel: No results for input(s): "VITAMINB12", "FOLATE", "FERRITIN", "TIBC", "IRON", "RETICCTPCT" in the last 72 hours.  Sepsis Labs: Recent Labs  Lab 11/02/22 0743  PROCALCITON 0.31    Recent Results (from the past 240 hour(s))  Surgical pcr screen     Status: None   Collection Time: 10/30/22 12:42 PM   Specimen: Nasal Mucosa; Nasal Swab  Result Value Ref Range Status   MRSA, PCR NEGATIVE NEGATIVE Final   Staphylococcus aureus NEGATIVE NEGATIVE Final    Comment: (NOTE) The Xpert SA Assay (FDA approved for NASAL specimens in patients 39 years of age and older), is one component of a comprehensive surveillance program. It is not intended to diagnose infection nor to guide or monitor treatment. Performed at [facility], [address], [place], [place] [address]          Radiology Studies: DG CHEST PORT 1 VIEW  Result Date: 11/02/2022 CLINICAL DATA:  Shortness of breath. EXAM: PORTABLE CHEST 1 VIEW COMPARISON:  10/28/2022. FINDINGS: Bilateral lungs appear hyperlucent with coarse bronchovascular markings, concerning for underlying COPD. There are slightly increased interstitial markings when compared to the prior exam, concerning for mild underlying pulmonary edema. There are atelectatic changes/scarring at the lung bases. Subtle blunting of left lateral costophrenic angle is similar to the prior study and may represent trace pleural effusion versus overlying soft tissue. Bilateral lungs otherwise appear clear. Stable  cardio-mediastinal silhouette. There is a left sided 2-lead pacemaker. No acute osseous abnormalities. Partially seen lower cervical spinal fixation hardware. There are surgical staples along the left inferomedial neck region. The soft tissues are within normal limits. IMPRESSION: *Findings concerning for COPD. There is probable superimposed mild pulmonary edema. *Blunting of left lateral costophrenic angle is similar to the prior study and may represent trace pleural effusion versus overlying soft tissue. Electronically Signed   By: Jules Schick M.D.   On: 11/02/2022 08:19        Scheduled Meds:  atorvastatin  40 mg Oral QPM   enoxaparin (LOVENOX) injection  30 mg Subcutaneous Q24H   fluticasone  2 spray Each Nare Daily   metoprolol tartrate  75 mg Oral BID   mometasone-formoterol  2 puff Inhalation BID   montelukast  10 mg Oral QPM   pantoprazole  40 mg Oral Daily   senna-docusate  1 tablet Oral BID   sorbitol, milk of mag, mineral oil, glycerin (SMOG) enema  960 mL Rectal Once   umeclidinium bromide  1 puff Inhalation Daily   Continuous  Infusions:     LOS: 6 days    Time spent: 51 minutes spent on chart review, discussion with nursing staff, consultants, updating family and interview/physical exam; more than 50% of that time was spent in counseling and/or coordination of care.    Alvira Philips [place], DO [facility] Available via Epic secure chat 7am-7pm After these hours, please refer to coverage provider listed on [email or web address] 11/03/2022, 10:50 AM

## 2022-11-04 DIAGNOSIS — K567 Ileus, unspecified: Secondary | ICD-10-CM | POA: Diagnosis not present

## 2022-11-04 DIAGNOSIS — K9189 Other postprocedural complications and disorders of digestive system: Secondary | ICD-10-CM

## 2022-11-04 DIAGNOSIS — S72001A Fracture of unspecified part of neck of right femur, initial encounter for closed fracture: Secondary | ICD-10-CM | POA: Diagnosis not present

## 2022-11-04 LAB — CBC
HCT: 28.7 % — ABNORMAL LOW (ref 36.0–46.0)
Hemoglobin: 9.4 g/dL — ABNORMAL LOW (ref 12.0–15.0)
MCH: 28.9 pg (ref 26.0–34.0)
MCHC: 32.8 g/dL (ref 30.0–36.0)
MCV: 88.3 fL (ref 80.0–100.0)
Platelets: 249 10*3/uL (ref 150–400)
RBC: 3.25 MIL/uL — ABNORMAL LOW (ref 3.87–5.11)
RDW: 16.4 % — ABNORMAL HIGH (ref 11.5–15.5)
WBC: 12.9 10*3/uL — ABNORMAL HIGH (ref 4.0–10.5)
nRBC: 0.8 % — ABNORMAL HIGH (ref 0.0–0.2)

## 2022-11-04 LAB — BASIC METABOLIC PANEL
Anion gap: 10 (ref 5–15)
BUN: 52 mg/dL — ABNORMAL HIGH (ref 8–23)
CO2: 25 mmol/L (ref 22–32)
Calcium: 7.8 mg/dL — ABNORMAL LOW (ref 8.9–10.3)
Chloride: 91 mmol/L — ABNORMAL LOW (ref 98–111)
Creatinine, Ser: 1.63 mg/dL — ABNORMAL HIGH (ref 0.44–1.00)
GFR, Estimated: 30 mL/min — ABNORMAL LOW (ref >60)
Glucose, Bld: 100 mg/dL — ABNORMAL HIGH (ref 70–99)
Potassium: 4.7 mmol/L (ref 3.5–5.1)
Sodium: 126 mmol/L — ABNORMAL LOW (ref 135–145)

## 2022-11-04 LAB — MAGNESIUM: Magnesium: 2.3 mg/dL (ref 1.7–2.4)

## 2022-11-04 MED ORDER — DEXTROSE-SODIUM CHLORIDE 5-0.9 % IV SOLN: INTRAVENOUS | Status: DC

## 2022-11-04 MED ORDER — PROCHLORPERAZINE EDISYLATE 10 MG/2ML IJ SOLN
10.0000 mg | Freq: Four times a day (QID) | INTRAMUSCULAR | Status: DC | PRN
  Administered 2022-11-04 – 2022-11-06 (×2): 10 mg via INTRAVENOUS
  Filled 2022-11-04 (×2): qty 2

## 2022-11-04 NOTE — Progress Notes (Signed)
PROGRESS NOTE    Felicia Benson  ZOX:[id number] DOB: [birth year] DOA: 10/28/2022 PCP: [name], MD    Brief Narrative:   Felicia Benson is a 89 y.o. female with past medical history significant for chronic respiratory failure/COPD on 4 L nasal cannula at baseline, chronic diastolic congestive heart failure, HTN, HLD, anemia of chronic medical disease who presented to [facility] ED on 10/20 from home via EMS complaining of right hip and knee pain following mechanical fall.  Denies loss of consciousness or striking his head.  Patient reported immediate pain to his right hip region following fall with inability to move his right lower extremity.  Denies headache, blurred vision, chest pain, no shortness of breath, no palpitations, no N/V/D, no dizziness, no syncope versus presyncopal episode.  Takes baby aspirin outpatient, otherwise not on any additional blood thinners.  In the ED, temperature 97.6 F, HR 72, RR 15, BP 135/68, SpO2 92% on 4 L nasal cannula.  WBC 13.9, hemoglobin 9.7, platelet count 268.  Sodium 141, potassium 3.5, chloride 101, CO2 26, glucose 125, BUN 23, creatinine 1.07, AST 19, ALT 14, total bili 1.0.  Right hip/pelvis with acute displaced right intertrochanteric femoral fracture.  Right knee x-ray with no acute fracture/dislocation, soft tissue swelling inferior to patella likely representative contusion.  Chest x-ray with emphysematous and chronic bronchitic changes of the lungs, no focal consolidation.  CT head/C-spine with no acute intracranial abnormality, no displaced fracture, noted postoperative anterior fixation/fusion C3-C5, stable heterogeneous enlargement of right thyroid and isthmus likely indicative of multinodular goiter.  Orthopedics was consulted.  TRH consulted for admission for further evaluation and management of right displaced intertrochanteric femoral fracture.  Assessment & Plan:   Displaced right intertrochanteric femoral fracture Patient presenting to ED  with right hip pain with inability ambulate following mechanical fall at home.  Imaging notable for acute displaced right intertrochanteric femoral fracture.  Orthopedics was consulted and patient underwent ORIF by Dr. Blanchie Dessert on 10/30/2022 -- Orthopedics following, appreciate assistance -- Vitamin D 25-hydroxy level: 87.66 -- WBAT RLE -- Lovenox for postoperative DVT prophylaxis -- PT/OT evaluation: Pending -- 2-week postoperative follow-up with orthopedics for wound check  Postoperative ileus Patient with episode of emesis while eating dinner on 10/25 associated with mild generalized abdominal discomfort.  No reported bowel movement for several days despite stool softeners/laxatives.  Patient does endorse history of small bowel obstruction 1 year ago treated conservatively by NG tube decompression with resolution.  No previous history of intra-abdominal surgeries.  Abdominal x-ray 10/26 with gaseous dilation loops of large and small bowel consistent with ileus.  CT abdomen/pelvis with contrast with moderate colonic distention without evidence of high-grade bowel obstruction, diverticulosis without acute diverticulitis, stable indeterminate lesions pancreas and left adrenal gland likely benign given long-term stability -- N.p.o., sips with meds, ice chips -- IV fluid hydration -- Repeat abdominal x-ray in the a.m. -- If does not progress, may need to consider NG tube for abdominal decompression versus general surgery involvement  Acute renal failure Etiology likely secondary to dehydration from prolonged n.p.o. status while awaiting operative management as above. -- Cr 1.21>1.07>2.02>1.95>1.82>1.49>1.63 -- Hold home losartan due to AKI as above -- Start IV fluid hydration -- repeat BMP in am  Chronic respiratory failure/COPD on home O2 Baseline oxygen requirement 4 L per nasal cannula.  Chest x-ray on admission with noted emphysematous/chronic bronchitic changes with no focal  consolidation. -- Dulera 2 puffs twice daily -- Incruse Ellipta 1 puff daily -- Montelukast 10 mg p.o.  nightly --Continue supplemental oxygen, maintain SpO2 greater than 88%  Chronic diastolic congestive heart failure, compensated Essential hypertension -- Hold home amlodipine, metoprolol and losartan for now -- repeat 500 mL NS bolus today -- Continue monitor BP  Hyperlipidemia -- Atorvastatin 40 mg p.o. daily  Anemia of chronic medical disease -- Hgb 10.4>9.7>7.9>9.4>8.8>9.4 -- transfuse for hemoglobin <7.0 -- Repeat CBC in a.m.   DVT prophylaxis: enoxaparin (LOVENOX) injection 30 mg Start: 11/01/22 0900 SCDs Start: 10/28/22 2232    Code Status: Limited: Do not attempt resuscitation (DNR) -DNR-LIMITED -Do Not Intubate/DNI  Family Communication: No family present at bedside this morning  Disposition Plan:  Level of care: Med-Surg Status is: Inpatient Remains inpatient appropriate because: Pending SNF placement    Consultants:  Orthopedics, Dr. Blanchie Dessert  Procedures:  ORIF/IMN for right hip fracture; Dr. Blanchie Dessert; 10/30/2022  Antimicrobials:  Perioperative cefazolin   Subjective: Patient's examined bedside, resting comfortably.  Lying in bed.  Continues with nausea.  Discussed findings of CT scan concerning for postoperative ileus.  Does endorse positive flatus.  Will make n.p.o., start IV fluids today.  If fails to further progress, may need to consider NG tube placement for decompression and possible general surgery evaluation.  No other specific questions or concerns at this time.  Denies headache, no visual changes, no chest pain, no palpitations, no shortness of breath, no fever/chills/night sweats, no focal weakness, no fatigue, no paresthesias.  No other acute events overnight per nursing staff.  Objective: Vitals:   11/04/22 0109 11/04/22 0345 11/04/22 0500 11/04/22 0749  BP: 105/62 101/66  94/63  Pulse: 95 (!) 102  (!) 113  Resp:  16    Temp: 98.9 F  (37.2 C) 97.7 F (36.5 C)  98.3 F (36.8 C)  TempSrc: Oral Oral  Oral  SpO2: 93% 93%  91%  Weight:   94.7 kg   Height:        Intake/Output Summary (Last 24 hours) at 11/04/2022 1027 Last data filed at 11/04/2022 0110 Gross per 24 hour  Intake 120 ml  Output 701 ml  Net -581 ml    Filed Weights   10/30/22 1523 11/03/22 0500 11/04/22 0500  Weight: 86.5 kg 94 kg 94.7 kg    Examination:  Physical Exam: GEN: NAD, alert and oriented x 3, wd/wn HEENT: NCAT, PERRL, EOMI, sclera clear, MMM PULM: CTAB w/o wheezes/crackles, normal respiratory effort, on 4 L nasal cannula which is her baseline CV: RRR w/o M/G/R GI: abd soft, ND, mild TTP, + faint high-pitched BS MSK: no peripheral edema, surgical dressing noted in place, clean/dry/intact NEURO: No focal neurological deficits PSYCH: normal mood/affect Integumentary: Right lower extremity surgical dressing as above, otherwise no concerning rashes/lesions/wounds noted exposed skin surfaces    Data Reviewed: I have personally reviewed following labs and imaging studies  CBC: Recent Labs  Lab 10/28/22 1919 10/29/22 0254 10/30/22 0747 10/31/22 0541 11/01/22 1308 11/02/22 0744 11/04/22 0751  WBC 8.8 13.9* 15.0* 13.4* 12.8* 13.2* 12.9*  NEUTROABS 5.4 11.1* 12.5*  --   --   --   --   HGB 10.4* 9.7* 7.9* 9.4* 8.8* 8.8* 9.4*  HCT 34.4* 33.0* 25.3* 28.9* 27.3* 27.0* 28.7*  MCV 91.2 95.7 89.4 87.8 88.3 87.9 88.3  PLT 272 268 187 166 173 179 249   Basic Metabolic Panel: Recent Labs  Lab 10/28/22 2331 10/29/22 0254 10/30/22 0747 10/31/22 0541 11/01/22 0816 11/02/22 0744 11/04/22 0751  NA  --  141 139 133* 132* 129* 126*  K  --  3.5 3.5 3.8 4.0 3.9 4.7  CL  --  101 96* 97* 97* 94* 91*  CO2  --  26 28 27 24 24 25   GLUCOSE  --  125* 111* 126* 126* 149* 100*  BUN  --  23 37* 42* 51* 50* 52*  CREATININE  --  1.07* 2.02* 1.95* 1.82* 1.49* 1.63*  CALCIUM  --  8.5* 8.8* 7.7* 7.7* 7.5* 7.8*  MG 1.5* 1.5*  --  2.2  --  2.1 2.3    GFR: Estimated Creatinine Clearance: 26.6 mL/min (A) (by C-G formula based on SCr of 1.63 mg/dL (H)). Liver Function Tests: Recent Labs  Lab 10/29/22 0254  AST 19  ALT 14  ALKPHOS 68  BILITOT 1.0  PROT 6.1*  ALBUMIN 2.6*   No results for input(s): "LIPASE", "AMYLASE" in the last 168 hours. No results for input(s): "AMMONIA" in the last 168 hours. Coagulation Profile: Recent Labs  Lab 10/29/22 0254  INR 1.1   Cardiac Enzymes: No results for input(s): "CKTOTAL", "CKMB", "CKMBINDEX", "TROPONINI" in the last 168 hours. BNP (last 3 results) No results for input(s): "PROBNP" in the last 8760 hours. HbA1C: No results for input(s): "HGBA1C" in the last 72 hours. CBG: No results for input(s): "GLUCAP" in the last 168 hours. Lipid Profile: No results for input(s): "CHOL", "HDL", "LDLCALC", "TRIG", "CHOLHDL", "LDLDIRECT" in the last 72 hours. Thyroid Function Tests: No results for input(s): "TSH", "T4TOTAL", "FREET4", "T3FREE", "THYROIDAB" in the last 72 hours. Anemia Panel: No results for input(s): "VITAMINB12", "FOLATE", "FERRITIN", "TIBC", "IRON", "RETICCTPCT" in the last 72 hours.  Sepsis Labs: Recent Labs  Lab 11/02/22 0743  PROCALCITON 0.31    Recent Results (from the past 240 hour(s))  Surgical pcr screen     Status: None   Collection Time: 10/30/22 12:42 PM   Specimen: Nasal Mucosa; Nasal Swab  Result Value Ref Range Status   MRSA, PCR NEGATIVE NEGATIVE Final   Staphylococcus aureus NEGATIVE NEGATIVE Final    Comment: (NOTE) The Xpert SA Assay (FDA approved for NASAL specimens in patients 27 years of age and older), is one component of a comprehensive surveillance program. It is not intended to diagnose infection nor to guide or monitor treatment. Performed at [facility], [address], [place], [place] [address]          Radiology Studies: CT ABDOMEN PELVIS W CONTRAST  Result Date: 11/03/2022 CLINICAL DATA:  Hip surgery 5 days ago, nausea  and vomiting, pain EXAM: CT ABDOMEN AND PELVIS WITH CONTRAST TECHNIQUE: Multidetector CT imaging of the abdomen and pelvis was performed using the standard protocol following bolus administration of intravenous contrast. RADIATION DOSE REDUCTION: This exam was performed according to the departmental dose-optimization program which includes automated exposure control, adjustment of the mA and/or kV according to patient size and/or use of iterative reconstruction technique. CONTRAST:  60mL OMNIPAQUE IOHEXOL 350 MG/ML SOLN COMPARISON:  11/03/2022, 01/13/2022 FINDINGS: Lower chest: Trace left pleural effusion. Scattered areas of dependent consolidation within the lower lobes, left greater than right, favor atelectasis. Stable cardiomegaly without pericardial effusion. Hepatobiliary: No focal liver abnormality is seen. Status post cholecystectomy. No biliary dilatation. Pancreas: Stable cystic areas within the pancreatic body and tail, measuring up to 1.5 cm. No specific imaging follow-up is recommended given size and long-term stability. No acute inflammatory changes or pancreatic duct dilation. Spleen: Normal in size without focal abnormality. Adrenals/Urinary Tract: The kidneys enhance normally and symmetrically. Punctate less than 2 mm nonobstructing left renal calculus. No obstructive uropathy  within either kidney. The bladder is minimally distended, without gross abnormality. The right adrenal is unremarkable. Stable 1.9 cm indeterminate left adrenal nodule, measuring 93 HU, likely adenoma based on lack of significant change since 10/18/2020. Stomach/Bowel: There is no evidence of high-grade bowel obstruction. Oral contrast administered for the exam has progressed into the colon by the time of imaging. Colon is moderately distended with scattered gas fluid levels, which may reflect postoperative ileus given recent orthopedic surgery. Diverticulosis throughout the colon, most pronounced within the sigmoid, without  evidence of acute diverticulitis. No bowel wall thickening or inflammatory change. Small hiatal hernia. Vascular/Lymphatic: Aortic atherosclerosis. No enlarged abdominal or pelvic lymph nodes. Reproductive: Status post hysterectomy. No adnexal masses. Other: No free fluid or free intraperitoneal gas. No abdominal wall hernia. Musculoskeletal: Postsurgical changes from recent right hip ORIF spanning an intertrochanteric right hip fracture, with extensive postsurgical changes in the overlying soft tissues. No other acute bony abnormalities. Reconstructed images demonstrate no additional findings. IMPRESSION: 1. Moderate colonic distension which may reflect postoperative ileus given recent orthopedic surgery. No evidence of high-grade bowel obstruction, as oral contrast has progressed into the colon by the time of imaging. 2. Colonic diverticulosis without evidence of acute diverticulitis. 3. Stable indeterminate lesions within the pancreas and left adrenal gland as above, likely benign given long-term stability. No specific imaging follow-up is required. 4.  Aortic Atherosclerosis (ICD10-I70.0). 5. Postsurgical changes from recent ORIF of an intertrochanteric right hip fracture. No evidence of acute complication. Electronically Signed   By: Sharlet Salina M.D.   On: 11/03/2022 20:58   DG Abd 1 View  Result Date: 11/03/2022 CLINICAL DATA:  [id number] Abdominal pain [id number] EXAM: ABDOMEN - 1 VIEW COMPARISON:  November 02, 2022, January 13, 2022 FINDINGS: There is gaseous dilation of loops of large and small bowel. Some rectal air is visualized. Status post RIGHT femoral intramedullary rod fixation. IMPRESSION: There is gaseous dilation of loops of large and small bowel. Findings are favored to reflect ileus in a postsurgical status. If clinical concern for acute obstruction, recommend dedicated CT. Electronically Signed   By: Meda Klinefelter M.D.   On: 11/03/2022 13:38        Scheduled Meds:  atorvastatin  40 mg  Oral QPM   enoxaparin (LOVENOX) injection  30 mg Subcutaneous Q24H   fluticasone  2 spray Each Nare Daily   metoprolol tartrate  75 mg Oral BID   mometasone-formoterol  2 puff Inhalation BID   montelukast  10 mg Oral QPM   pantoprazole  40 mg Oral Daily   senna-docusate  1 tablet Oral BID   umeclidinium bromide  1 puff Inhalation Daily   Continuous Infusions:  dextrose 5 % and 0.9 % NaCl 50 mL/hr at 11/04/22 0829      LOS: 7 days    Time spent: 51 minutes spent on chart review, discussion with nursing staff, consultants, updating family and interview/physical exam; more than 50% of that time was spent in counseling and/or coordination of care.    Alvira Philips [place], DO [facility] Available via Epic secure chat 7am-7pm After these hours, please refer to coverage provider listed on [email or web address] 11/04/2022, 10:27 AM

## 2022-11-05 ENCOUNTER — Inpatient Hospital Stay (HOSPITAL_COMMUNITY): Payer: Medicare Other

## 2022-11-05 DIAGNOSIS — S72001A Fracture of unspecified part of neck of right femur, initial encounter for closed fracture: Secondary | ICD-10-CM | POA: Diagnosis not present

## 2022-11-05 DIAGNOSIS — K9189 Other postprocedural complications and disorders of digestive system: Secondary | ICD-10-CM | POA: Diagnosis not present

## 2022-11-05 DIAGNOSIS — K567 Ileus, unspecified: Secondary | ICD-10-CM | POA: Diagnosis not present

## 2022-11-05 LAB — BASIC METABOLIC PANEL
Anion gap: 9 (ref 5–15)
BUN: 56 mg/dL — ABNORMAL HIGH (ref 8–23)
CO2: 21 mmol/L — ABNORMAL LOW (ref 22–32)
Calcium: 7.2 mg/dL — ABNORMAL LOW (ref 8.9–10.3)
Chloride: 95 mmol/L — ABNORMAL LOW (ref 98–111)
Creatinine, Ser: 1.58 mg/dL — ABNORMAL HIGH (ref 0.44–1.00)
GFR, Estimated: 31 mL/min — ABNORMAL LOW (ref >60)
Glucose, Bld: 100 mg/dL — ABNORMAL HIGH (ref 70–99)
Potassium: 4.7 mmol/L (ref 3.5–5.1)
Sodium: 125 mmol/L — ABNORMAL LOW (ref 135–145)

## 2022-11-05 LAB — CBC
HCT: 26.1 % — ABNORMAL LOW (ref 36.0–46.0)
Hemoglobin: 8.5 g/dL — ABNORMAL LOW (ref 12.0–15.0)
MCH: 28.7 pg (ref 26.0–34.0)
MCHC: 32.6 g/dL (ref 30.0–36.0)
MCV: 88.2 fL (ref 80.0–100.0)
Platelets: 253 10*3/uL (ref 150–400)
RBC: 2.96 MIL/uL — ABNORMAL LOW (ref 3.87–5.11)
RDW: 16.2 % — ABNORMAL HIGH (ref 11.5–15.5)
WBC: 10.1 10*3/uL (ref 4.0–10.5)
nRBC: 0.5 % — ABNORMAL HIGH (ref 0.0–0.2)

## 2022-11-05 LAB — MAGNESIUM: Magnesium: 2.3 mg/dL (ref 1.7–2.4)

## 2022-11-05 MED ORDER — SODIUM CHLORIDE 0.9 % IV SOLN: INTRAVENOUS | Status: AC

## 2022-11-05 NOTE — Progress Notes (Signed)
Mobility Specialist: Progress Note   11/05/22 1603  Mobility  Activity Transferred from chair to bed  Level of Assistance +2 (takes two people)  Assistive Device MaxiMove  RLE Weight Bearing WBAT  Activity Response Tolerated well  Mobility Referral Yes  $Mobility charge 1 Mobility  Mobility Specialist Start Time (ACUTE ONLY) 1535  Mobility Specialist Stop Time (ACUTE ONLY) 1604  Mobility Specialist Time Calculation (min) (ACUTE ONLY) 29 min    Pt requested to move back to bed - received in chair. Maxi-move used. Had c/o RLE pain during lift movement. Left in bed with all needs met, call bell in reach.   Maurene Capes Mobility Specialist Please contact via SecureChat or Rehab office at [phone]

## 2022-11-05 NOTE — Progress Notes (Signed)
Physical Therapy Treatment Patient Details Name: Felicia Benson MRN: [id number] DOB: September 26, 1930 Today's Date: 11/05/2022   History of Present Illness Pt is a 89 y/o female presenting on 10/20 after fall.  Found with R hip fx, s/p IM nail 10/22.  PMH includes: chronic hypoxic respiratory failure on baseline and 4 L continuous nasal cannula, COPD, chronic diastolic heart failure, essential pretension, hyperlipidemia anemia of chronic disease is a baseline hemoglobin range 10-12.    PT Comments  Continuing work on functional mobility and activity tolerance; session focused on discerning a safe way to transfer out of bed to chair, utilizing the maximove for mechanical lift transfer; patient rolled well right and left using bed rails for lift pad placement; during the transfer, opted to keep the carriage in a more reclined position for better tolerance of the right leg strap given her hip fracture and surgical repair; overall, [name] tolerated the maxi move transfer quite well and her blood pressure was stable in the recliner with her feet up and in a semi reclined position; will plan to use maximove to help patient out of bed next session, and then practice sit to stand from the chair    If plan is discharge home, recommend the following: Two people to help with walking and/or transfers;Assist for transportation   Can travel by private vehicle     No  Equipment Recommendations  Wheelchair (measurements PT);Wheelchair cushion (measurements PT);Hospital bed;Hoyer lift    Recommendations for Other Services       Precautions / Restrictions Precautions Precautions: Fall;Other (comment) Precaution Comments: BPs soft, oxygen 4L at baseline Restrictions RLE Weight Bearing: Weight bearing as tolerated     Mobility  Bed Mobility Overal bed mobility: Needs Assistance Bed Mobility: Rolling Rolling: Min assist, Used rails         General bed mobility comments: Min assist to roll R and L for  Maximove pad placement    Transfers Overall transfer level: Needs assistance Equipment used: 2 person hand held assist, Ambulation equipment used Transfers: Bed to chair/wheelchair/BSC             General transfer comment: Opted to use the [name] for OOB transfers; notable that we kept the carriage in mostly reclined position for comfort; use the Maximove with teh gray base for her particular recliner Transfer via Lift Equipment: Maximove  Ambulation/Gait                   Stairs             Wheelchair Mobility     Tilt Bed    Modified Rankin (Stroke Patients Only)       Balance                                            Cognition Arousal: Alert Behavior During Therapy: WFL for tasks assessed/performed Overall Cognitive Status: Within Functional Limits for tasks assessed                                 General Comments: Fro simple mobility        Exercises General Exercises - Lower Extremity Ankle Circles/Pumps: AROM, Both, 15 reps Quad Sets: AROM, Right, 5 reps, Other (comment) (Needing ;multimodal cues for quad contraction) Short Arc Quad: AAROM, Right, 5 reps  General Comments General comments (skin integrity, edema, etc.): Up in chair, BP 110/80, HR 80; O2 sats 94% on 4L supplemental O2      Pertinent Vitals/Pain Pain Assessment Pain Assessment: Faces Faces Pain Scale: Hurts whole lot Pain Location: R hip with motion; brief moments of pain ramping up; decreases quickly Pain Descriptors / Indicators: Grimacing, Guarding Pain Intervention(s): Monitored during session    Home Living                          Prior Function            PT Goals (current goals can now be found in the care plan section) Acute Rehab PT Goals Patient Stated Goal: Pt would like to get back to PLOF PT Goal Formulation: With patient/family Time For Goal Achievement: 11/14/22 Potential to Achieve Goals:  Fair Progress towards PT goals: Progressing toward goals    Frequency    Min 1X/week      PT Plan      Co-evaluation              AM-PAC PT "6 Clicks" Mobility   Outcome Measure  Help needed turning from your back to your side while in a flat bed without using bedrails?: A Lot Help needed moving from lying on your back to sitting on the side of a flat bed without using bedrails?: Total Help needed moving to and from a bed to a chair (including a wheelchair)?: Total Help needed standing up from a chair using your arms (e.g., wheelchair or bedside chair)?: Total Help needed to walk in hospital room?: Total Help needed climbing 3-5 steps with a railing? : Total 6 Click Score: 7    End of Session Equipment Utilized During Treatment: Oxygen;Other (comment) (Maximove) Activity Tolerance: Patient tolerated treatment well Patient left: in chair;with call bell/phone within reach;with chair alarm set;with family/visitor present Nurse Communication: Mobility status PT Visit Diagnosis: Unsteadiness on feet (R26.81);History of falling (Z91.81);Muscle weakness (generalized) (M62.81);Pain Pain - Right/Left: Right Pain - part of body: Hip     Time: 1241-1330 PT Time Calculation (min) (ACUTE ONLY): 49 min  Charges:    $Therapeutic Exercise: 8-22 mins $Therapeutic Activity: 23-37 mins PT General Charges $$ ACUTE PT VISIT: 1 Visit                     Van Clines, PT  Acute Rehabilitation Services Office [phone] Secure Chat welcomed    Levi Aland 11/05/2022, 5:05 PM

## 2022-11-05 NOTE — Progress Notes (Signed)
PROGRESS NOTE    AMEN MIKULA  ZOX:[id number] DOB: 12-03-1930 DOA: 10/28/2022 PCP: [name], MD    Brief Narrative:   Felicia Benson is a 89 y.o. female with past medical history significant for chronic respiratory failure/COPD on 4 L nasal cannula at baseline, chronic diastolic congestive heart failure, HTN, HLD, anemia of chronic medical disease who presented to [facility] ED on 10/20 from home via EMS complaining of right hip and knee pain following mechanical fall.  Denies loss of consciousness or striking his head.  Patient reported immediate pain to his right hip region following fall with inability to move his right lower extremity.  Denies headache, blurred vision, chest pain, no shortness of breath, no palpitations, no N/V/D, no dizziness, no syncope versus presyncopal episode.  Takes baby aspirin outpatient, otherwise not on any additional blood thinners.  In the ED, temperature 97.6 F, HR 72, RR 15, BP 135/68, SpO2 92% on 4 L nasal cannula.  WBC 13.9, hemoglobin 9.7, platelet count 268.  Sodium 141, potassium 3.5, chloride 101, CO2 26, glucose 125, BUN 23, creatinine 1.07, AST 19, ALT 14, total bili 1.0.  Right hip/pelvis with acute displaced right intertrochanteric femoral fracture.  Right knee x-ray with no acute fracture/dislocation, soft tissue swelling inferior to patella likely representative contusion.  Chest x-ray with emphysematous and chronic bronchitic changes of the lungs, no focal consolidation.  CT head/C-spine with no acute intracranial abnormality, no displaced fracture, noted postoperative anterior fixation/fusion C3-C5, stable heterogeneous enlargement of right thyroid and isthmus likely indicative of multinodular goiter.  Orthopedics was consulted.  TRH consulted for admission for further evaluation and management of right displaced intertrochanteric femoral fracture.  Assessment & Plan:   Displaced right intertrochanteric femoral fracture Patient presenting to ED  with right hip pain with inability ambulate following mechanical fall at home.  Imaging notable for acute displaced right intertrochanteric femoral fracture.  Orthopedics was consulted and patient underwent ORIF by Dr. Blanchie Dessert on 10/30/2022 -- Orthopedics following, appreciate assistance -- Vitamin D 25-hydroxy level: 87.66 -- WBAT RLE -- Lovenox for postoperative DVT prophylaxis -- PT/OT evaluation: Pending -- 2-week postoperative follow-up with orthopedics for wound check  Postoperative ileus Patient with episode of emesis while eating dinner on 10/25 associated with mild generalized abdominal discomfort.  No reported bowel movement for several days despite stool softeners/laxatives.  Patient does endorse history of small bowel obstruction 1 year ago treated conservatively by NG tube decompression with resolution.  No previous history of intra-abdominal surgeries.  Abdominal x-ray 10/26 with gaseous dilation loops of large and small bowel consistent with ileus.  CT abdomen/pelvis with contrast with moderate colonic distention without evidence of high-grade bowel obstruction, diverticulosis without acute diverticulitis, stable indeterminate lesions pancreas and left adrenal gland likely benign given long-term stability -- Advance to clear liquid diet today -- IVF w/ NS at 75 mL/h -- Repeat abdominal x-ray in the a.m. -- If does not progress, may need to consider NG tube for abdominal decompression versus general surgery involvement  Acute renal failure Etiology likely secondary to dehydration from prolonged n.p.o. status while awaiting operative management as above. -- Cr 1.21>1.07>2.02>1.95>1.82>1.49>1.63>1.58 -- Hold home losartan due to AKI as above -- Continue IVF w/ NS at 75 mL/h -- repeat BMP in am  Chronic respiratory failure/COPD on home O2 Baseline oxygen requirement 4 L per nasal cannula.  Chest x-ray on admission with noted emphysematous/chronic bronchitic changes with no focal  consolidation. -- Dulera 2 puffs twice daily -- Incruse Ellipta 1 puff  daily -- Montelukast 10 mg p.o. nightly --Continue supplemental oxygen, maintain SpO2 greater than 88%  Chronic diastolic congestive heart failure, compensated Essential hypertension -- Hold home amlodipine, metoprolol and losartan for now -- repeat 500 mL NS bolus today -- Continue monitor BP  Hyperlipidemia -- Atorvastatin 40 mg p.o. daily  Anemia of chronic medical disease -- Hgb 10.4>9.7>7.9>9.4>8.8>9.4 -- transfuse for hemoglobin <7.0 -- Repeat CBC in a.m.   DVT prophylaxis: enoxaparin (LOVENOX) injection 30 mg Start: 11/01/22 0900 SCDs Start: 10/28/22 2232    Code Status: Limited: Do not attempt resuscitation (DNR) -DNR-LIMITED -Do Not Intubate/DNI  Family Communication: No family present at bedside this morning  Disposition Plan:  Level of care: Med-Surg Status is: Inpatient Remains inpatient appropriate because: Pending SNF placement; needs further advancement of diet and resolution of postoperative ileus    Consultants:  Orthopedics, Dr. Blanchie Dessert  Procedures:  ORIF/IMN for right hip fracture; Dr. Blanchie Dessert; 10/30/2022  Antimicrobials:  Perioperative cefazolin   Subjective: Patient's examined bedside, resting comfortably.  Lying in bed.  Continues with generalized abdominal pain, although less distended.  Reports passing flatus.  Wishes to advance to clear liquid diet today.  No further nausea/vomiting.  Will continue IV fluids today.  No other specific questions or concerns at this time.  Denies headache, no visual changes, no chest pain, no palpitations, no shortness of breath, no fever/chills/night sweats, no focal weakness, no fatigue, no paresthesias.  No other acute events overnight per nursing staff.  Objective: Vitals:   11/04/22 1931 11/05/22 0507 11/05/22 0809 11/05/22 0814  BP: 109/67 106/66  108/65  Pulse: 72 74  78  Resp: 16 16  18   Temp: 98.1 F (36.7 C) 98.5 F (36.9  C)  97.8 F (36.6 C)  TempSrc: Oral Oral  Oral  SpO2: 97% 94% 92% 91%  Weight:      Height:        Intake/Output Summary (Last 24 hours) at 11/05/2022 1117 Last data filed at 11/05/2022 0000 Gross per 24 hour  Intake 891.95 ml  Output --  Net 891.95 ml    Filed Weights   10/30/22 1523 11/03/22 0500 11/04/22 0500  Weight: 86.5 kg 94 kg 94.7 kg    Examination:  Physical Exam: GEN: NAD, alert and oriented x 3, wd/wn HEENT: NCAT, PERRL, EOMI, sclera clear, MMM PULM: CTAB w/o wheezes/crackles, normal respiratory effort, on 4 L nasal cannula which is her baseline CV: RRR w/o M/G/R GI: abd soft, ND, mild TTP, + high-pitched BS MSK: no peripheral edema, surgical dressing noted in place, clean/dry/intact NEURO: No focal neurological deficits PSYCH: normal mood/affect Integumentary: Right lower extremity surgical dressing as above, otherwise no concerning rashes/lesions/wounds noted exposed skin surfaces    Data Reviewed: I have personally reviewed following labs and imaging studies  CBC: Recent Labs  Lab 10/30/22 0747 10/31/22 0541 11/01/22 1308 11/02/22 0744 11/04/22 0751 11/05/22 0830  WBC 15.0* 13.4* 12.8* 13.2* 12.9* 10.1  NEUTROABS 12.5*  --   --   --   --   --   HGB 7.9* 9.4* 8.8* 8.8* 9.4* 8.5*  HCT 25.3* 28.9* 27.3* 27.0* 28.7* 26.1*  MCV 89.4 87.8 88.3 87.9 88.3 88.2  PLT 187 166 173 179 249 253   Basic Metabolic Panel: Recent Labs  Lab 10/31/22 0541 11/01/22 0816 11/02/22 0744 11/04/22 0751 11/05/22 0830  NA 133* 132* 129* 126* 125*  K 3.8 4.0 3.9 4.7 4.7  CL 97* 97* 94* 91* 95*  CO2 27 24 24 25  21*  GLUCOSE  126* 126* 149* 100* 100*  BUN 42* 51* 50* 52* 56*  CREATININE 1.95* 1.82* 1.49* 1.63* 1.58*  CALCIUM 7.7* 7.7* 7.5* 7.8* 7.2*  MG 2.2  --  2.1 2.3 2.3   GFR: Estimated Creatinine Clearance: 27.5 mL/min (A) (by C-G formula based on SCr of 1.58 mg/dL (H)). Liver Function Tests: No results for input(s): "AST", "ALT", "ALKPHOS", "BILITOT",  "PROT", "ALBUMIN" in the last 168 hours.  No results for input(s): "LIPASE", "AMYLASE" in the last 168 hours. No results for input(s): "AMMONIA" in the last 168 hours. Coagulation Profile: No results for input(s): "INR", "PROTIME" in the last 168 hours.  Cardiac Enzymes: No results for input(s): "CKTOTAL", "CKMB", "CKMBINDEX", "TROPONINI" in the last 168 hours. BNP (last 3 results) No results for input(s): "PROBNP" in the last 8760 hours. HbA1C: No results for input(s): "HGBA1C" in the last 72 hours. CBG: No results for input(s): "GLUCAP" in the last 168 hours. Lipid Profile: No results for input(s): "CHOL", "HDL", "LDLCALC", "TRIG", "CHOLHDL", "LDLDIRECT" in the last 72 hours. Thyroid Function Tests: No results for input(s): "TSH", "T4TOTAL", "FREET4", "T3FREE", "THYROIDAB" in the last 72 hours. Anemia Panel: No results for input(s): "VITAMINB12", "FOLATE", "FERRITIN", "TIBC", "IRON", "RETICCTPCT" in the last 72 hours.  Sepsis Labs: Recent Labs  Lab 11/02/22 0743  PROCALCITON 0.31    Recent Results (from the past 240 hour(s))  Surgical pcr screen     Status: None   Collection Time: 10/30/22 12:42 PM   Specimen: Nasal Mucosa; Nasal Swab  Result Value Ref Range Status   MRSA, PCR NEGATIVE NEGATIVE Final   Staphylococcus aureus NEGATIVE NEGATIVE Final    Comment: (NOTE) The Xpert SA Assay (FDA approved for NASAL specimens in patients 58 years of age and older), is one component of a comprehensive surveillance program. It is not intended to diagnose infection nor to guide or monitor treatment. Performed at [facility], [address], [place], [place] [address]          Radiology Studies: DG Abd Portable 1V  Result Date: 11/05/2022 CLINICAL DATA:  [id number] Ileus, postoperative (HCC) [id number] EXAM: PORTABLE ABDOMEN - 1 VIEW COMPARISON:  Abdominal radiograph 11/03/22 FINDINGS: Redemonstrated diffusely dilated loops of small bowel, not significantly changed in size  from prior exam. No pneumatosis. No supine evidence of pneumoperitoneum. Partially imaged intramedullary screws along the right femur. Bibasilar atelectasis. IMPRESSION: Redemonstrated diffusely dilated loops of small bowel, not significantly changed in size from prior exam. Electronically Signed   By: Lorenza Cambridge M.D.   On: 11/05/2022 10:38   CT ABDOMEN PELVIS W CONTRAST  Result Date: 11/03/2022 CLINICAL DATA:  Hip surgery 5 days ago, nausea and vomiting, pain EXAM: CT ABDOMEN AND PELVIS WITH CONTRAST TECHNIQUE: Multidetector CT imaging of the abdomen and pelvis was performed using the standard protocol following bolus administration of intravenous contrast. RADIATION DOSE REDUCTION: This exam was performed according to the departmental dose-optimization program which includes automated exposure control, adjustment of the mA and/or kV according to patient size and/or use of iterative reconstruction technique. CONTRAST:  60mL OMNIPAQUE IOHEXOL 350 MG/ML SOLN COMPARISON:  11/03/2022, 01/13/2022 FINDINGS: Lower chest: Trace left pleural effusion. Scattered areas of dependent consolidation within the lower lobes, left greater than right, favor atelectasis. Stable cardiomegaly without pericardial effusion. Hepatobiliary: No focal liver abnormality is seen. Status post cholecystectomy. No biliary dilatation. Pancreas: Stable cystic areas within the pancreatic body and tail, measuring up to 1.5 cm. No specific imaging follow-up is recommended given size and long-term stability. No acute  inflammatory changes or pancreatic duct dilation. Spleen: Normal in size without focal abnormality. Adrenals/Urinary Tract: The kidneys enhance normally and symmetrically. Punctate less than 2 mm nonobstructing left renal calculus. No obstructive uropathy within either kidney. The bladder is minimally distended, without gross abnormality. The right adrenal is unremarkable. Stable 1.9 cm indeterminate left adrenal nodule, measuring  93 HU, likely adenoma based on lack of significant change since 10/18/2020. Stomach/Bowel: There is no evidence of high-grade bowel obstruction. Oral contrast administered for the exam has progressed into the colon by the time of imaging. Colon is moderately distended with scattered gas fluid levels, which may reflect postoperative ileus given recent orthopedic surgery. Diverticulosis throughout the colon, most pronounced within the sigmoid, without evidence of acute diverticulitis. No bowel wall thickening or inflammatory change. Small hiatal hernia. Vascular/Lymphatic: Aortic atherosclerosis. No enlarged abdominal or pelvic lymph nodes. Reproductive: Status post hysterectomy. No adnexal masses. Other: No free fluid or free intraperitoneal gas. No abdominal wall hernia. Musculoskeletal: Postsurgical changes from recent right hip ORIF spanning an intertrochanteric right hip fracture, with extensive postsurgical changes in the overlying soft tissues. No other acute bony abnormalities. Reconstructed images demonstrate no additional findings. IMPRESSION: 1. Moderate colonic distension which may reflect postoperative ileus given recent orthopedic surgery. No evidence of high-grade bowel obstruction, as oral contrast has progressed into the colon by the time of imaging. 2. Colonic diverticulosis without evidence of acute diverticulitis. 3. Stable indeterminate lesions within the pancreas and left adrenal gland as above, likely benign given long-term stability. No specific imaging follow-up is required. 4.  Aortic Atherosclerosis (ICD10-I70.0). 5. Postsurgical changes from recent ORIF of an intertrochanteric right hip fracture. No evidence of acute complication. Electronically Signed   By: Sharlet Salina M.D.   On: 11/03/2022 20:58        Scheduled Meds:  atorvastatin  40 mg Oral QPM   enoxaparin (LOVENOX) injection  30 mg Subcutaneous Q24H   fluticasone  2 spray Each Nare Daily   metoprolol tartrate  75 mg Oral  BID   mometasone-formoterol  2 puff Inhalation BID   montelukast  10 mg Oral QPM   pantoprazole  40 mg Oral Daily   senna-docusate  1 tablet Oral BID   umeclidinium bromide  1 puff Inhalation Daily   Continuous Infusions:      LOS: 8 days    Time spent: 51 minutes spent on chart review, discussion with nursing staff, consultants, updating family and interview/physical exam; more than 50% of that time was spent in counseling and/or coordination of care.    Alvira Philips [place], DO [facility] Available via Epic secure chat 7am-7pm After these hours, please refer to coverage provider listed on [email or web address] 11/05/2022, 11:17 AM

## 2022-11-06 ENCOUNTER — Inpatient Hospital Stay (HOSPITAL_COMMUNITY): Payer: Medicare Other

## 2022-11-06 DIAGNOSIS — K567 Ileus, unspecified: Secondary | ICD-10-CM | POA: Diagnosis not present

## 2022-11-06 DIAGNOSIS — S72001A Fracture of unspecified part of neck of right femur, initial encounter for closed fracture: Secondary | ICD-10-CM | POA: Diagnosis not present

## 2022-11-06 DIAGNOSIS — K9189 Other postprocedural complications and disorders of digestive system: Secondary | ICD-10-CM | POA: Diagnosis not present

## 2022-11-06 LAB — CBC
HCT: 29.1 % — ABNORMAL LOW (ref 36.0–46.0)
Hemoglobin: 9.4 g/dL — ABNORMAL LOW (ref 12.0–15.0)
MCH: 28.8 pg (ref 26.0–34.0)
MCHC: 32.3 g/dL (ref 30.0–36.0)
MCV: 89.3 fL (ref 80.0–100.0)
Platelets: 346 10*3/uL (ref 150–400)
RBC: 3.26 MIL/uL — ABNORMAL LOW (ref 3.87–5.11)
RDW: 16.3 % — ABNORMAL HIGH (ref 11.5–15.5)
WBC: 11 10*3/uL — ABNORMAL HIGH (ref 4.0–10.5)
nRBC: 0.5 % — ABNORMAL HIGH (ref 0.0–0.2)

## 2022-11-06 LAB — BASIC METABOLIC PANEL
Anion gap: 13 (ref 5–15)
BUN: 59 mg/dL — ABNORMAL HIGH (ref 8–23)
CO2: 20 mmol/L — ABNORMAL LOW (ref 22–32)
Calcium: 7.7 mg/dL — ABNORMAL LOW (ref 8.9–10.3)
Chloride: 92 mmol/L — ABNORMAL LOW (ref 98–111)
Creatinine, Ser: 1.61 mg/dL — ABNORMAL HIGH (ref 0.44–1.00)
GFR, Estimated: 30 mL/min — ABNORMAL LOW (ref >60)
Glucose, Bld: 139 mg/dL — ABNORMAL HIGH (ref 70–99)
Potassium: 4.8 mmol/L (ref 3.5–5.1)
Sodium: 125 mmol/L — ABNORMAL LOW (ref 135–145)

## 2022-11-06 LAB — MAGNESIUM: Magnesium: 2.4 mg/dL (ref 1.7–2.4)

## 2022-11-06 MED ORDER — DIATRIZOATE MEGLUMINE & SODIUM 66-10 % PO SOLN
90.0000 mL | Freq: Once | ORAL | Status: AC
  Administered 2022-11-06: 90 mL via NASOGASTRIC
  Filled 2022-11-06: qty 90

## 2022-11-06 MED ORDER — MESALAMINE ER 250 MG PO CPCR
1000.0000 mg | ORAL_CAPSULE | Freq: Two times a day (BID) | ORAL | Status: DC
  Administered 2022-11-08 – 2022-11-14 (×13): 1000 mg via ORAL
  Filled 2022-11-06 (×16): qty 4

## 2022-11-06 NOTE — Care Management Important Message (Signed)
Important Message  Patient Details  Name: Felicia Benson MRN: [id number] Date of Birth: September 02, 1930   Important Message Given:  Yes - Medicare IM     Sherilyn Banker 11/06/2022, 12:06 PM

## 2022-11-06 NOTE — Progress Notes (Signed)
Occupational Therapy Treatment Patient Details Name: Felicia Benson MRN: [id number] DOB: 1930/04/24 Today's Date: 11/06/2022   History of present illness Pt is a 89 y/o female presenting on 10/20 after fall.  Found with R hip fx, s/p IM nail 10/22.  PMH includes: chronic hypoxic respiratory failure on baseline and 4 L continuous nasal cannula, COPD, chronic diastolic heart failure, essential pretension, hyperlipidemia anemia of chronic disease is a baseline hemoglobin range 10-12.   OT comments  Patient received in supine and stated she felt nauseous but willing to try. Patient stated she wanted to attempt transfer without maximove and agreed to use [facility] for safety. Patient was mod assist +2 for sidelying to sitting on EOB. Once on EOB patient stated nausea increased and after a few minutes began vomiting. Patient tolerated ~5 minutes on EOB before asking to return to supine. Patient had small BM while on EOB and performed rolling to clean up. Patient feeling better at end of session. Patient will benefit from continued inpatient follow up therapy, <3 hours/day to continue to address self care and functional transfers. Acute OT to continue to follow.       If plan is discharge home, recommend the following:  Two people to help with walking and/or transfers;Two people to help with bathing/dressing/bathroom   Equipment Recommendations  Other (comment) (defer)    Recommendations for Other Services      Precautions / Restrictions Precautions Precautions: Fall;Other (comment) Precaution Comments: BPs soft, oxygen 4L at baseline Restrictions Weight Bearing Restrictions: Yes RLE Weight Bearing: Weight bearing as tolerated       Mobility Bed Mobility Overal bed mobility: Needs Assistance Bed Mobility: Rolling, Sidelying to Sit, Sit to Sidelying Rolling: Min assist, Mod assist, Used rails Sidelying to sit: Mod assist, +2 for physical assistance     Sit to sidelying: Max assist, +2 for  physical assistance General bed mobility comments: min assist to roll to left and mod assist to roll to right, assistance with BLEs and trunk with getting to EOB and back to supine    Transfers Overall transfer level: Needs assistance                 General transfer comment: not attempted due to neausea     Balance Overall balance assessment: Needs assistance Sitting-balance support: Bilateral upper extremity supported, Single extremity supported Sitting balance-Leahy Scale: Fair Sitting balance - Comments: CGA due to neausea and vomiting Postural control: Posterior lean                                 ADL either performed or assessed with clinical judgement   ADL Overall ADL's : Needs assistance/impaired     Grooming: Minimal assistance;Sitting;Wash/dry face Grooming Details (indicate cue type and reason): seated on EOB     Lower Body Bathing: Total assistance;+2 for physical assistance;Bed level Lower Body Bathing Details (indicate cue type and reason): patient performed rolling for peri area cleaning from [facility]                            Extremity/Trunk Assessment              Vision       Perception     Praxis      Cognition Arousal: Alert Behavior During Therapy: [facility] for tasks assessed/performed Overall Cognitive Status: Within Functional Limits for tasks assessed  General Comments: patient with nausea and vomiting this session        Exercises      Shoulder Instructions       General Comments BP in supine at beginning of session 11/80 and at end of session 106/79. SpO2 92-94%    Pertinent Vitals/ Pain       Pain Assessment Pain Assessment: Faces Faces Pain Scale: Hurts little more Pain Location: Right hip with bed mobility Pain Descriptors / Indicators: Grimacing, Guarding Pain Intervention(s): Monitored during session, Repositioned, RN gave pain meds during  session  Home Living                                          Prior Functioning/Environment              Frequency  Min 1X/week        Progress Toward Goals  OT Goals(current goals can now be found in the care plan section)  Progress towards OT goals: Progressing toward goals  Acute Rehab OT Goals Patient Stated Goal: feel better OT Goal Formulation: With patient Time For Goal Achievement: 11/15/22 Potential to Achieve Goals: Fair ADL Goals Pt Will Perform Grooming: with set-up;sitting Pt Will Perform Upper Body Dressing: with set-up;sitting Pt Will Transfer to Toilet: with max assist;with +2 assist;stand pivot transfer;bedside commode Additional ADL Goal #1: Pt will complete bed mobility with mod assist +2. Additional ADL Goal #2: Pt will maintain sitting balance at EOB during ADLs with no more than supervision for 5 minutes.  Plan      Co-evaluation                 AM-PAC OT "6 Clicks" Daily Activity     Outcome Measure   Help from another person eating meals?: A Lot Help from another person taking care of personal grooming?: A Lot Help from another person toileting, which includes using toliet, bedpan, or urinal?: Total Help from another person bathing (including washing, rinsing, drying)?: A Lot Help from another person to put on and taking off regular upper body clothing?: A Lot Help from another person to put on and taking off regular lower body clothing?: Total 6 Click Score: 10    End of Session Equipment Utilized During Treatment: Oxygen  OT Visit Diagnosis: Other abnormalities of gait and mobility (R26.89);Muscle weakness (generalized) (M62.81);Pain;History of falling (Z91.81) Pain - Right/Left: Right Pain - part of body: Hip   Activity Tolerance Treatment limited secondary to medical complications (Comment) (neausea and vomiting)   Patient Left in bed;with call bell/phone within reach;with bed alarm set   Nurse  Communication Mobility status;Other (comment) (neausea)        Time: 0930-1010 OT Time Calculation (min): 40 min  Charges: OT General Charges $OT Visit: 1 Visit OT Treatments $Self Care/Home Management : 23-37 mins $Therapeutic Activity: 8-22 mins  Alfonse Flavors, OTA Acute Rehabilitation Services  Office [phone]   Dewain Penning 11/06/2022, 11:20 AM

## 2022-11-06 NOTE — Consult Note (Signed)
[facility] Consult  Referring Provider: [facility] hospitalist/Dr. [place] Primary Care Physician:  [name], MD Primary Gastroenterologist: Dr. Myrtie [facility]  Reason for Consultation: Ileus  HPI: Felicia Benson is a 89 y.o. female admitted on 10/28/2022 after a fall, noted to have acute displaced right intertrochanteric femoral fracture, status post ORIF on 10/30/2022. Patient states for the last 10 days she has not had any bowel movements, has developed worsening abdominal distention, discomfort and has had 2 episodes of vomiting. She has prior history of small bowel obstruction requiring conservative management with NG tube decompression. She has history of Crohn's disease diagnosed about 18 to 20 years ago, takes Pentasa 500 mg 2 tablets 4 times a day at home.  Last EGD was performed in 2017 by Dr. Bosie Clos which showed acute duodenitis, mild gastritis, no evidence of H. pylori or intestinal metaplasia. Last colonoscopy was performed by Dr. Bosie Clos in 2017 which showed diverticulosis in sigmoid and descending, internal hemorrhoids, repeat not recommended due to age. She had a capsule endoscopy in 2017 for anemia and occult blood in stool, history of Crohn's disease and it showed active Crohn's with possible stricturing.  Patient states that today she had a small bowel movement and is passing gas and feels like the abdominal distention has improved minimally. An NG tube was placed a few minutes ago and has not yet been connected to wall suction.   Past Medical History:  Diagnosis Date   AAA (abdominal aortic aneurysm) (HCC)    CHF (congestive heart failure) (HCC)    Crohn's disease (HCC)    Diverticulitis    Diverticulosis    GERD (gastroesophageal reflux disease)    GI bleed    Hypertension    OSA (obstructive sleep apnea)    Pacemaker    Thyroid nodule     Past Surgical History:  Procedure Laterality Date   CHOLECYSTECTOMY     COLONOSCOPY WITH PROPOFOL  N/A 06/01/2015   Procedure: COLONOSCOPY WITH PROPOFOL;  Surgeon: Charlott Rakes, MD;  Location: [facility] ENDOSCOPY;  Service: Endoscopy;  Laterality: N/A;   CORONARY ANGIOPLASTY WITH STENT PLACEMENT     ESOPHAGOGASTRODUODENOSCOPY (EGD) WITH PROPOFOL N/A 06/01/2015   Procedure: ESOPHAGOGASTRODUODENOSCOPY (EGD) WITH PROPOFOL;  Surgeon: Charlott Rakes, MD;  Location: [facility] ENDOSCOPY;  Service: Endoscopy;  Laterality: N/A;   GIVENS CAPSULE STUDY N/A 06/03/2015   Procedure: GIVENS CAPSULE STUDY;  Surgeon: Charlott Rakes, MD;  Location: [facility] ENDOSCOPY;  Service: Endoscopy;  Laterality: N/A;   INTRAMEDULLARY (IM) NAIL INTERTROCHANTERIC Right 10/30/2022   Procedure: INTRAMEDULLARY nailing of right femur;  Surgeon: Joen Laura, MD;  Location: [facility] OR;  Service: Orthopedics;  Laterality: Right;   PACEMAKER PLACEMENT  2015    Prior to Admission medications   Medication Sig Start Date End Date Taking? Authorizing Provider  acetaminophen (TYLENOL) 500 MG tablet Take 1,000 mg by mouth daily.   Yes [provider]  albuterol (VENTOLIN HFA) 108 (90 Base) MCG/ACT inhaler Inhale 2 puffs into the lungs every 6 (six) hours as needed for wheezing or shortness of breath. 07/24/22  Yes [name], MD  amLODipine (NORVASC) 10 MG tablet Take 1 tablet (10 mg total) by mouth every evening. Patient taking differently: Take 10 mg by mouth daily. 07/24/22  Yes [name], MD  aspirin EC 81 MG tablet Take 1 tablet (81 mg total) by mouth daily. Swallow whole. 03/29/21  Yes [name], MD  atorvastatin (LIPITOR) 40 MG tablet Take 40 mg by mouth every evening.   Yes [provider]  Cholecalciferol (VITAMIN D3) 5000 units TABS Take 5,000 Units by mouth daily.    Yes [provider]  docusate sodium (COLACE) 100 MG capsule Take 2 capsules (200 mg total) by mouth 2 (two) times daily. Patient taking differently: Take 200 mg by mouth daily. 07/24/22  Yes [name], MD  fluticasone  [facility]) 50 MCG/ACT nasal spray Place 2 sprays into both nostrils daily. Patient taking differently: Place 2 sprays into both nostrils daily as needed for allergies. 08/06/22  Yes Cobb, Ruby Cola, NP  furosemide (LASIX) 40 MG tablet Take 1.5 tablets (60 mg total) by mouth daily. 07/24/22  Yes [name], MD  losartan (COZAAR) 25 MG tablet Take 0.5 tablets (12.5 mg total) by mouth daily. 07/24/22  Yes [name], MD  melatonin 3 MG TABS tablet Take 1 tablet (3 mg total) by mouth at bedtime. Patient taking differently: Take 3 mg by mouth at bedtime as needed (sleep). 07/24/22  Yes [name], MD  mesalamine (PENTASA) 500 MG CR capsule Take 1,000 mg by mouth 2 (two) times daily.   Yes [provider]  Metoprolol Tartrate 75 MG TABS Take 75 mg by mouth 2 (two) times daily.   Yes [provider]  mometasone-formoterol (DULERA) 100-5 MCG/ACT AERO Inhale 2 puffs into the lungs 2 (two) times daily.   Yes [provider]  montelukast (SINGULAIR) 10 MG tablet Take 1 tablet (10 mg total) by mouth daily. Patient taking differently: Take 10 mg by mouth every evening. 08/01/21  Yes [name], Adewale A, MD  pantoprazole (PROTONIX) 40 MG tablet Take 40 mg by mouth 2 (two) times daily.   Yes [provider]  polyethylene glycol (MIRALAX / GLYCOLAX) 17 g packet Take 17 g by mouth daily as needed. Patient taking differently: Take 17 g by mouth daily as needed for mild constipation. 07/24/22  Yes [name], MD  Propyl Glycol-Hydroxyethylcell (NASAL MOIST) GEL Place 1 application  into the nose 2 (two) times daily.   Yes [provider]    Current Facility-Administered Medications  Medication Dose Route Frequency Provider Last Rate Last Admin   acetaminophen (TYLENOL) tablet 650 mg  650 mg Oral Q6H PRN Cecil Cobbs, PA-C   650 mg at 11/04/22 2107   Or   acetaminophen (TYLENOL) suppository 650 mg  650 mg Rectal Q6H PRN Cecil Cobbs, PA-C        albuterol (PROVENTIL) (2.5 MG/3ML) 0.083% nebulizer solution 2.5 mg  2.5 mg Nebulization Q4H PRN Kathie Dike M, PA-C   2.5 mg at 10/30/22 1956   alum & mag hydroxide-simeth (MAALOX/MYLANTA) 200-200-20 MG/5ML suspension 30 mL  30 mL Oral Q4H PRN Cecil Cobbs, PA-C   30 mL at 10/29/22 1745   atorvastatin (LIPITOR) tablet 40 mg  40 mg Oral QPM Kathie Dike M, PA-C   40 mg at 11/05/22 1807   diatrizoate meglumine-sodium (GASTROGRAFIN) 66-10 % solution 90 mL  90 mL Per NG tube Once [place], Eric J, DO       enoxaparin (LOVENOX) injection 30 mg  30 mg Subcutaneous Q24H [place], Alvira Philips, DO   30 mg at 11/06/22 0955   fluticasone (FLONASE) 50 MCG/ACT nasal spray 2 spray  2 spray Each Nare Daily Cecil Cobbs, PA-C   2 spray at 11/05/22 1149   HYDROmorphone (DILAUDID) injection 0.5 mg  0.5 mg Intravenous Q2H PRN Cecil Cobbs, PA-C   0.5 mg at 10/31/22 0033   melatonin tablet 3 mg  3 mg  Oral QHS PRN Cecil Cobbs, PA-C   3 mg at 11/05/22 2233   [START ON 11/07/2022] mesalamine (PENTASA) CR capsule 1,000 mg  1,000 mg Oral BID [place], Alvira Philips, DO       metoprolol tartrate (LOPRESSOR) tablet 75 mg  75 mg Oral BID [place], Eric J, DO   75 mg at 11/05/22 2037   mometasone-formoterol (DULERA) 100-5 MCG/ACT inhaler 2 puff  2 puff Inhalation BID Cecil Cobbs, PA-C   2 puff at 11/06/22 0737   montelukast (SINGULAIR) tablet 10 mg  10 mg Oral QPM Kathie Dike M, PA-C   10 mg at 11/05/22 1807   naloxone [facility]) injection 0.4 mg  0.4 mg Intravenous PRN Kathie Dike M, PA-C       ondansetron [facility]) injection 4 mg  4 mg Intravenous Q6H PRN Kathie Dike M, PA-C   4 mg at 11/06/22 1336   oxyCODONE (Oxy IR/ROXICODONE) immediate release tablet 5-7.5 mg  5-7.5 mg Oral Q4H PRN Cecil Cobbs, PA-C   5 mg at 11/02/22 1443   pantoprazole (PROTONIX) EC tablet 40 mg  40 mg Oral Daily [place], Eric J, DO   40 mg at 11/05/22 1149   polyethylene glycol (MIRALAX / GLYCOLAX)  packet 17 g  17 g Oral Daily PRN [place], Eric J, DO   17 g at 11/02/22 1038   prochlorperazine (COMPAZINE) injection 10 mg  10 mg Intravenous Q6H PRN [place], Eric J, DO   10 mg at 11/06/22 7124   senna-docusate (Senokot-S) tablet 1 tablet  1 tablet Oral BID [place], Eric J, DO   1 tablet at 11/05/22 2038   umeclidinium bromide (INCRUSE ELLIPTA) 62.5 MCG/ACT 1 puff  1 puff Inhalation Daily Cecil Cobbs, PA-C   1 puff at 11/06/22 5809    Allergies as of 10/28/2022 - Review Complete 10/28/2022  Allergen Reaction Noted   Ibuprofen  07/13/2021   Ace inhibitors  07/13/2021   Breztri aerosphere [budeson-glycopyrrol-formoterol] Hypertension 10/28/2022   Ciprofloxacin  08/22/2022   Levofloxacin  08/22/2022   Nsaids Nausea And Vomiting 12/30/2020    Family History  Problem Relation Age of Onset   CVA Mother    Lung cancer Father    Colon cancer Neg Hx     Social History   Socioeconomic History   Marital status: Widowed    Spouse name: Not on file   Number of children: Not on file   Years of education: Not on file   Highest education level: Not on file  Occupational History   Not on file  Tobacco Use   Smoking status: Former   Smokeless tobacco: Never  Vaping Use   Vaping status: Never Used  Substance and Sexual Activity   Alcohol use: No    Comment: Quit in 1985   Drug use: No   Sexual activity: Not on file  Other Topics Concern   Not on file  Social History Narrative   Not on file   Social Determinants of Health   Financial Resource Strain: Not on file  Food Insecurity: No Food Insecurity (10/29/2022)   Hunger Vital Sign    Worried About Running Out of Food in the Last Year: Never true    Ran Out of Food in the Last Year: Never true  Transportation Needs: No Transportation Needs (10/29/2022)   PRAPARE - Administrator, Civil Service (Medical): No    Lack of Transportation (Non-Medical): No  Physical Activity: Not on file  Stress: Not on  file   Social Connections: Not on file  Intimate Partner Violence: Not on file    Review of Systems: As per HPI  Physical Exam: Vital signs in last 24 hours: Temp:  [98.5 F (36.9 C)-98.6 F (37 C)] 98.5 F (36.9 C) (10/29 0425) Pulse Rate:  [72-78] 78 (10/29 0425) Resp:  [18] 18 (10/29 0425) BP: (102-112)/(67-76) 112/76 (10/29 0425) SpO2:  [93 %-96 %] 93 % (10/29 0738) Weight:  [97.4 kg] 97.4 kg (10/29 0500) Last BM Date : 10/25/22  General:   NG tube placed, not yet connected to wall suction  Head:  Normocephalic and atraumatic. Eyes:  Sclera clear, no icterus.  Mild pallor Ears:  Normal auditory acuity. Nose:  No deformity, discharge,  or lesions. Mouth:  No deformity or lesions.  Oropharynx pink & moist. Neck:  Supple; no masses or thyromegaly. Lungs:  Clear throughout to auscultation.   No wheezes, crackles, or rhonchi. No acute distress. Heart:  Regular rate and rhythm; no murmurs, clicks, rubs,  or gallops. Extremities:  Without clubbing or edema. Neurologic:  Alert and  oriented x4;  grossly normal neurologically. Skin:  Intact without significant lesions or rashes. Psych:  Alert and cooperative. Normal mood and affect. Abdomen: Distended, mild diffuse tenderness, bowel sounds audible     Lab Results: Recent Labs    11/04/22 0751 11/05/22 0830 11/06/22 0830  WBC 12.9* 10.1 11.0*  HGB 9.4* 8.5* 9.4*  HCT 28.7* 26.1* 29.1*  PLT 249 253 346   BMET Recent Labs    11/04/22 0751 11/05/22 0830 11/06/22 0830  NA 126* 125* 125*  K 4.7 4.7 4.8  CL 91* 95* 92*  CO2 25 21* 20*  GLUCOSE 100* 100* 139*  BUN 52* 56* 59*  CREATININE 1.63* 1.58* 1.61*  CALCIUM 7.8* 7.2* 7.7*   LFT No results for input(s): "PROT", "ALBUMIN", "AST", "ALT", "ALKPHOS", "BILITOT", "BILIDIR", "IBILI" in the last 72 hours. PT/INR No results for input(s): "LABPROT", "INR" in the last 72 hours.  Studies/Results: DG Abd Portable 1V-Small Bowel Protocol-Position Verification  Result  Date: 11/06/2022 CLINICAL DATA:  NG tube placement. EXAM: PORTABLE ABDOMEN - 1 VIEW COMPARISON:  Radiograph yesterday, CT 11/03/2022 FINDINGS: Tip and side port of the enteric tube below the diaphragm in the stomach. Gaseous bowel distention is similar to yesterday's exam. IMPRESSION: Tip and side port of the enteric tube below the diaphragm in the stomach. Electronically Signed   By: Narda Rutherford M.D.   On: 11/06/2022 14:38   DG Abd Portable 1V  Result Date: 11/05/2022 CLINICAL DATA:  [id number] Ileus, postoperative (HCC) [id number] EXAM: PORTABLE ABDOMEN - 1 VIEW COMPARISON:  Abdominal radiograph 11/03/22 FINDINGS: Redemonstrated diffusely dilated loops of small bowel, not significantly changed in size from prior exam. No pneumatosis. No supine evidence of pneumoperitoneum. Partially imaged intramedullary screws along the right femur. Bibasilar atelectasis. IMPRESSION: Redemonstrated diffusely dilated loops of small bowel, not significantly changed in size from prior exam. Electronically Signed   By: Lorenza Cambridge M.D.   On: 11/05/2022 10:38    Impression: Postop ileus NG tube placed, not connected to wall suction yet Potassium 4.8, sodium low at 125, magnesium 2.4 Elevated BUN 59, elevated creatinine 1.61, low GFR 30  CT abdomen pelvis with contrast 11/03/2022: No evidence of high-grade small bowel obstruction, colon moderately distended with scattered gas/fluid level which may represent postoperative ileus, diverticulosis throughout colon Abdominal x-ray 11/03/2022: Gaseous dilation of loops of large and small bowel, ileus, postsurgical Abdominal x-ray 11/05/2022: Dilated loops of small bowel,  not significantly changed Labs: 10/29/2022 -T. bili 1, AST 19, ALT 14, ALP 68, normal PT/INR 14.8/1.1 Normocytic anemia, hemoglobin 9.4, platelet 346  Plan: Surgical team has been consulted, small bowel obstruction protocol initial and 8-hour delay abdominal x-ray has been ordered.  Post x-ray, recommend  connecting NG tube to intermittent wall suction.  Because patient is status post hip surgery, early aggressive ambulation may not be possible however is encouraged.  Limiting narcotics and continues to keeping potassium above 4 will help.  Patient states that apple juice, prune juice helps with bowel movements at home when she is constipated and she did have a bowel movement today morning which is encouraging.  MiraLAX 17 g as needed and senna 1 tablet twice a day has been ordered, which can possibly be given with NG tube clamped for 30 minutes after receiving these medications.  I will follow x-rays as ordered along with the primary team and surgical team, hopefully conservative management will be enough for small bowel obstruction/postoperative ileus to resolve.   LOS: 9 days   Kerin Salen, MD  11/06/2022, 3:33 PM

## 2022-11-06 NOTE — Progress Notes (Signed)
PROGRESS NOTE    Felicia Benson  ZOX:[id number] DOB: 07-03-1930 DOA: 10/28/2022 PCP: [name], MD    Brief Narrative:   Felicia Benson is a 89 y.o. female with past medical history significant for chronic respiratory failure/COPD on 4 L nasal cannula at baseline, chronic diastolic congestive heart failure, HTN, HLD, anemia of chronic medical disease who presented to [facility] ED on 10/20 from home via EMS complaining of right hip and knee pain following mechanical fall.  Denies loss of consciousness or striking his head.  Patient reported immediate pain to his right hip region following fall with inability to move his right lower extremity.  Denies headache, blurred vision, chest pain, no shortness of breath, no palpitations, no N/V/D, no dizziness, no syncope versus presyncopal episode.  Takes baby aspirin outpatient, otherwise not on any additional blood thinners.  In the ED, temperature 97.6 F, HR 72, RR 15, BP 135/68, SpO2 92% on 4 L nasal cannula.  WBC 13.9, hemoglobin 9.7, platelet count 268.  Sodium 141, potassium 3.5, chloride 101, CO2 26, glucose 125, BUN 23, creatinine 1.07, AST 19, ALT 14, total bili 1.0.  Right hip/pelvis with acute displaced right intertrochanteric femoral fracture.  Right knee x-ray with no acute fracture/dislocation, soft tissue swelling inferior to patella likely representative contusion.  Chest x-ray with emphysematous and chronic bronchitic changes of the lungs, no focal consolidation.  CT head/C-spine with no acute intracranial abnormality, no displaced fracture, noted postoperative anterior fixation/fusion C3-C5, stable heterogeneous enlargement of right thyroid and isthmus likely indicative of multinodular goiter.  Orthopedics was consulted.  TRH consulted for admission for further evaluation and management of right displaced intertrochanteric femoral fracture.  Assessment & Plan:   Displaced right intertrochanteric femoral fracture Patient presenting to ED  with right hip pain with inability ambulate following mechanical fall at home.  Imaging notable for acute displaced right intertrochanteric femoral fracture.  Orthopedics was consulted and patient underwent ORIF by Dr. Blanchie Dessert on 10/30/2022 -- Orthopedics following, appreciate assistance -- Vitamin D 25-hydroxy level: 87.66 -- WBAT RLE -- Lovenox for postoperative DVT prophylaxis -- PT/OT evaluation: Pending -- 2-week postoperative follow-up with orthopedics for wound check  Postoperative ileus Patient with episode of emesis while eating dinner on 10/25 associated with mild generalized abdominal discomfort.  No reported bowel movement for several days despite stool softeners/laxatives.  Patient does endorse history of small bowel obstruction 1 year ago treated conservatively by NG tube decompression with resolution.  No previous history of intra-abdominal surgeries.  Abdominal x-ray 10/26 with gaseous dilation loops of large and small bowel consistent with ileus.  CT abdomen/pelvis with contrast with moderate colonic distention without evidence of high-grade bowel obstruction, diverticulosis without acute diverticulitis, stable indeterminate lesions pancreas and left adrenal gland likely benign given long-term stability -- Clear liquid diet -- IVF w/ NS at 75 mL/h -- Given lack of progression, discussed with general surgery 10/29; recommend NGT placement, GI consult  Acute renal failure Etiology likely secondary to dehydration from prolonged n.p.o. status while awaiting operative management as above. -- Cr 1.21>1.07>2.02>1.95>1.82>1.49>1.63>1.58>labs pending this am -- Hold home losartan due to AKI as above -- Continue IVF w/ NS at 75 mL/h -- repeat BMP in am  Chronic respiratory failure/COPD on home O2 Baseline oxygen requirement 4 L per nasal cannula.  Chest x-ray on admission with noted emphysematous/chronic bronchitic changes with no focal consolidation. -- Dulera 2 puffs twice  daily -- Incruse Ellipta 1 puff daily -- Montelukast 10 mg p.o. nightly --Continue supplemental oxygen,  maintain SpO2 greater than 88%  Chronic diastolic congestive heart failure, compensated Essential hypertension -- Hold home amlodipine, metoprolol and losartan for now -- repeat 500 mL NS bolus today -- Continue monitor BP  Hyperlipidemia -- Atorvastatin 40 mg p.o. daily  History of Crohn's disease Follows with [facility] gastroenterology, Dr. Bosie Clos outpatient.  On mesalamine. -- GI consult as above  Anemia of chronic medical disease -- Hgb 10.4>9.7>7.9>9.4>8.8>9.4>8.5>9.4 -- transfuse for hemoglobin <7.0 -- Repeat CBC in a.m.   DVT prophylaxis: enoxaparin (LOVENOX) injection 30 mg Start: 11/01/22 0900 SCDs Start: 10/28/22 2232    Code Status: Limited: Do not attempt resuscitation (DNR) -DNR-LIMITED -Do Not Intubate/DNI  Family Communication: No family present at bedside this morning  Disposition Plan:  Level of care: Med-Surg Status is: Inpatient Remains inpatient appropriate because: Pending SNF placement; needs further advancement of diet and resolution of postoperative ileus    Consultants:  Orthopedics, Dr. Blanchie Dessert General Surgery - discussed on 10/29; recommended NGT, GI consult [facility]  Procedures:  ORIF/IMN for right hip fracture; Dr. Blanchie Dessert; 10/30/2022  Antimicrobials:  Perioperative cefazolin   Subjective: Patient's examined bedside, lying in bed.  Continues with generalized abdominal pain; and feels abdominal distention progressing today. No further nausea and vomiting over past 2 days; but reports progressive abdominal distention and continued generalized abdominal pain, reports passing flatus. No further nausea/vomiting.  Discussed with general surgery, recommended NGT to suction and GI consult. Patient with no other specific questions or concerns at this time.  Denies headache, no visual changes, no chest pain, no palpitations, no shortness of  breath, no fever/chills/night sweats, no focal weakness, no fatigue, no paresthesias.  No other acute events overnight per nursing staff.  Objective: Vitals:   11/05/22 2151 11/06/22 0425 11/06/22 0500 11/06/22 0738  BP:  112/76    Pulse: 72 78    Resp:  18    Temp:  98.5 F (36.9 C)    TempSrc:  Oral    SpO2: 95% 96%  93%  Weight:   97.4 kg   Height:        Intake/Output Summary (Last 24 hours) at 11/06/2022 1003 Last data filed at 11/05/2022 1700 Gross per 24 hour  Intake 1057.62 ml  Output 800 ml  Net 257.62 ml    Filed Weights   11/03/22 0500 11/04/22 0500 11/06/22 0500  Weight: 94 kg 94.7 kg 97.4 kg    Examination:  Physical Exam: GEN: NAD, alert and oriented x 3, elderly in appearance HEENT: NCAT, PERRL, EOMI, sclera clear, MMM PULM: CTAB w/o wheezes/crackles, normal respiratory effort, on 4 L nasal cannula which is her baseline CV: RRR w/o M/G/R GI: abd soft, + distention, mild TTP, + high-pitched BS MSK: no peripheral edema, surgical dressing noted in place, clean/dry/intact NEURO: No focal neurological deficits PSYCH: normal mood/affect Integumentary: Right lower extremity surgical dressing as above, eccymosis LUE, otherwise no concerning rashes/lesions/wounds noted exposed skin surfaces    Data Reviewed: I have personally reviewed following labs and imaging studies  CBC: Recent Labs  Lab 11/01/22 1308 11/02/22 0744 11/04/22 0751 11/05/22 0830 11/06/22 0830  WBC 12.8* 13.2* 12.9* 10.1 11.0*  HGB 8.8* 8.8* 9.4* 8.5* 9.4*  HCT 27.3* 27.0* 28.7* 26.1* 29.1*  MCV 88.3 87.9 88.3 88.2 89.3  PLT 173 179 249 253 346   Basic Metabolic Panel: Recent Labs  Lab 10/31/22 0541 11/01/22 0816 11/02/22 0744 11/04/22 0751 11/05/22 0830  NA 133* 132* 129* 126* 125*  K 3.8 4.0 3.9 4.7 4.7  CL 97*  97* 94* 91* 95*  CO2 27 24 24 25  21*  GLUCOSE 126* 126* 149* 100* 100*  BUN 42* 51* 50* 52* 56*  CREATININE 1.95* 1.82* 1.49* 1.63* 1.58*  CALCIUM 7.7* 7.7*  7.5* 7.8* 7.2*  MG 2.2  --  2.1 2.3 2.3   GFR: Estimated Creatinine Clearance: 27.8 mL/min (A) (by C-G formula based on SCr of 1.58 mg/dL (H)). Liver Function Tests: No results for input(s): "AST", "ALT", "ALKPHOS", "BILITOT", "PROT", "ALBUMIN" in the last 168 hours.  No results for input(s): "LIPASE", "AMYLASE" in the last 168 hours. No results for input(s): "AMMONIA" in the last 168 hours. Coagulation Profile: No results for input(s): "INR", "PROTIME" in the last 168 hours.  Cardiac Enzymes: No results for input(s): "CKTOTAL", "CKMB", "CKMBINDEX", "TROPONINI" in the last 168 hours. BNP (last 3 results) No results for input(s): "PROBNP" in the last 8760 hours. HbA1C: No results for input(s): "HGBA1C" in the last 72 hours. CBG: No results for input(s): "GLUCAP" in the last 168 hours. Lipid Profile: No results for input(s): "CHOL", "HDL", "LDLCALC", "TRIG", "CHOLHDL", "LDLDIRECT" in the last 72 hours. Thyroid Function Tests: No results for input(s): "TSH", "T4TOTAL", "FREET4", "T3FREE", "THYROIDAB" in the last 72 hours. Anemia Panel: No results for input(s): "VITAMINB12", "FOLATE", "FERRITIN", "TIBC", "IRON", "RETICCTPCT" in the last 72 hours.  Sepsis Labs: Recent Labs  Lab 11/02/22 0743  PROCALCITON 0.31    Recent Results (from the past 240 hour(s))  Surgical pcr screen     Status: None   Collection Time: 10/30/22 12:42 PM   Specimen: Nasal Mucosa; Nasal Swab  Result Value Ref Range Status   MRSA, PCR NEGATIVE NEGATIVE Final   Staphylococcus aureus NEGATIVE NEGATIVE Final    Comment: (NOTE) The Xpert SA Assay (FDA approved for NASAL specimens in patients 76 years of age and older), is one component of a comprehensive surveillance program. It is not intended to diagnose infection nor to guide or monitor treatment. Performed at [facility], [address] [place], [place] [address]          Radiology Studies: DG Abd Portable 1V  Result Date:  11/05/2022 CLINICAL DATA:  [id number] Ileus, postoperative (HCC) [id number] EXAM: PORTABLE ABDOMEN - 1 VIEW COMPARISON:  Abdominal radiograph 11/03/22 FINDINGS: Redemonstrated diffusely dilated loops of small bowel, not significantly changed in size from prior exam. No pneumatosis. No supine evidence of pneumoperitoneum. Partially imaged intramedullary screws along the right femur. Bibasilar atelectasis. IMPRESSION: Redemonstrated diffusely dilated loops of small bowel, not significantly changed in size from prior exam. Electronically Signed   By: Lorenza Cambridge M.D.   On: 11/05/2022 10:38        Scheduled Meds:  atorvastatin  40 mg Oral QPM   enoxaparin (LOVENOX) injection  30 mg Subcutaneous Q24H   fluticasone  2 spray Each Nare Daily   metoprolol tartrate  75 mg Oral BID   mometasone-formoterol  2 puff Inhalation BID   montelukast  10 mg Oral QPM   pantoprazole  40 mg Oral Daily   senna-docusate  1 tablet Oral BID   umeclidinium bromide  1 puff Inhalation Daily   Continuous Infusions:  sodium chloride Stopped (11/06/22 0948)       LOS: 9 days    Time spent: 51 minutes spent on chart review, discussion with nursing staff, consultants, updating family and interview/physical exam; more than 50% of that time was spent in counseling and/or coordination of care.    Alvira Philips [place], DO [facility] Available via Epic secure  chat 7am-7pm After these hours, please refer to coverage provider listed on [email or web address] 11/06/2022, 10:03 AM

## 2022-11-06 NOTE — Plan of Care (Signed)

## 2022-11-07 DIAGNOSIS — I5032 Chronic diastolic (congestive) heart failure: Secondary | ICD-10-CM | POA: Diagnosis not present

## 2022-11-07 DIAGNOSIS — Z8709 Personal history of other diseases of the respiratory system: Secondary | ICD-10-CM | POA: Diagnosis not present

## 2022-11-07 DIAGNOSIS — K9189 Other postprocedural complications and disorders of digestive system: Secondary | ICD-10-CM | POA: Diagnosis not present

## 2022-11-07 DIAGNOSIS — R531 Weakness: Secondary | ICD-10-CM | POA: Diagnosis not present

## 2022-11-07 DIAGNOSIS — Z515 Encounter for palliative care: Secondary | ICD-10-CM

## 2022-11-07 DIAGNOSIS — Z66 Do not resuscitate: Secondary | ICD-10-CM

## 2022-11-07 DIAGNOSIS — S72001D Fracture of unspecified part of neck of right femur, subsequent encounter for closed fracture with routine healing: Secondary | ICD-10-CM | POA: Diagnosis not present

## 2022-11-07 LAB — CBC
HCT: 29.8 % — ABNORMAL LOW (ref 36.0–46.0)
Hemoglobin: 9.4 g/dL — ABNORMAL LOW (ref 12.0–15.0)
MCH: 28.3 pg (ref 26.0–34.0)
MCHC: 31.5 g/dL (ref 30.0–36.0)
MCV: 89.8 fL (ref 80.0–100.0)
Platelets: 353 10*3/uL (ref 150–400)
RBC: 3.32 MIL/uL — ABNORMAL LOW (ref 3.87–5.11)
RDW: 16.6 % — ABNORMAL HIGH (ref 11.5–15.5)
WBC: 13.1 10*3/uL — ABNORMAL HIGH (ref 4.0–10.5)
nRBC: 0.4 % — ABNORMAL HIGH (ref 0.0–0.2)

## 2022-11-07 LAB — COMPREHENSIVE METABOLIC PANEL
ALT: 13 U/L (ref 0–44)
AST: 23 U/L (ref 15–41)
Albumin: 1.9 g/dL — ABNORMAL LOW (ref 3.5–5.0)
Alkaline Phosphatase: 80 U/L (ref 38–126)
Anion gap: 9 (ref 5–15)
BUN: 57 mg/dL — ABNORMAL HIGH (ref 8–23)
CO2: 24 mmol/L (ref 22–32)
Calcium: 7.8 mg/dL — ABNORMAL LOW (ref 8.9–10.3)
Chloride: 96 mmol/L — ABNORMAL LOW (ref 98–111)
Creatinine, Ser: 1.53 mg/dL — ABNORMAL HIGH (ref 0.44–1.00)
GFR, Estimated: 32 mL/min — ABNORMAL LOW (ref >60)
Glucose, Bld: 91 mg/dL (ref 70–99)
Potassium: 4.9 mmol/L (ref 3.5–5.1)
Sodium: 129 mmol/L — ABNORMAL LOW (ref 135–145)
Total Bilirubin: 1.2 mg/dL (ref 0.3–1.2)
Total Protein: 5.5 g/dL — ABNORMAL LOW (ref 6.5–8.1)

## 2022-11-07 LAB — MAGNESIUM: Magnesium: 2.4 mg/dL (ref 1.7–2.4)

## 2022-11-07 MED ORDER — DEXTROSE-SODIUM CHLORIDE 5-0.9 % IV SOLN: INTRAVENOUS | Status: AC

## 2022-11-07 MED ORDER — LIDOCAINE 5 % EX PTCH
1.0000 | MEDICATED_PATCH | CUTANEOUS | Status: DC
  Administered 2022-11-07 – 2022-11-08 (×2): 1 via TRANSDERMAL
  Filled 2022-11-07 (×2): qty 1

## 2022-11-07 MED ORDER — PANTOPRAZOLE SODIUM 40 MG IV SOLR
40.0000 mg | INTRAVENOUS | Status: DC
  Administered 2022-11-07 – 2022-11-11 (×5): 40 mg via INTRAVENOUS
  Filled 2022-11-07 (×5): qty 10

## 2022-11-07 MED ORDER — METOPROLOL TARTRATE 5 MG/5ML IV SOLN: 2.5000 mg | Freq: Four times a day (QID) | INTRAVENOUS | Status: DC | PRN

## 2022-11-07 MED ORDER — OXYCODONE HCL 5 MG PO TABS
5.0000 mg | ORAL_TABLET | Freq: Once | ORAL | Status: AC
  Administered 2022-11-07: 5 mg via ORAL

## 2022-11-07 NOTE — Progress Notes (Signed)
[facility] A. Virgel Manifold NP notified via chat that patient has oral medications and NPO except ice chips asked for clarification patient has NGT

## 2022-11-07 NOTE — Progress Notes (Signed)
Larkin Ina NP stated via chat to hold oral meds and an order is not needed.

## 2022-11-07 NOTE — Progress Notes (Signed)
PROGRESS NOTE    Felicia Benson  RKY:[id number] DOB: 1930/04/02 DOA: 10/28/2022 PCP: [name], MD   Brief Narrative:  89 y.o. female with past medical history significant for chronic respiratory failure/COPD on 4 L nasal cannula at baseline, chronic diastolic congestive heart failure, HTN, HLD, anemia of chronic medical disease presented with right hip and knee pain following a mechanical fall.  She was found to have acute displaced right intertrochanteric femoral fracture.  CT head/C-spine showed no acute intracranial abnormality, stable heterogeneous enlargement of right thyroid that is most likely indicative of multinodular goiter.  She underwent ORIF by orthopedics on 10/30/2022.  Subsequently, PT recommended SNF placement.  Hospital course complicated by ileus.  NG tube placed on 11/06/2022.  GI consulted.  Assessment & Plan:   Displaced right intertrochanteric femur fracture after a mechanical fall -underwent ORIF by orthopedics on 10/30/2022.  -Subsequently, PT recommended SNF placement.  -Wound care/pain management/activity/DVT prophylaxis as per orthopedics recommendations.  Outpatient follow-up with orthopedics  Postoperative ileus -Did not improve with conservative management. -Abdominal x-ray 10/26 with gaseous dilation loops of large and small bowel consistent with ileus. CT abdomen/pelvis with contrast with moderate colonic distention without evidence of high-grade bowel obstruction, diverticulosis without acute diverticulitis, stable indeterminate lesions pancreas and left adrenal gland likely benign given long-term stability  -NG tube placed on 11/06/2022.  GI following.  Will also consult general surgery and follow recommendations.  Had a small bowel movement yesterday.  Acute renal failure -Creatinine improving to 1.53 this morning.  Resume IV fluids as patient still has ileus.  Hyponatremia -Possibly from poor oral intake.  Sodium improving to 129 today.  Monitor.   Resume IV fluids  Leukocytosis -Monitor  Chronic respiratory failure with hypoxia/COPD -On 4 L oxygen via nasal cannula normally.  Currently on the same.  Continue Dulera, Singulair and Incruse Ellipta.  Outpatient follow-up with pulmonary  Chronic diastolic heart failure Essential hypertension -Currently compensated.  Strict input and output.  Daily weights.  Amlodipine and losartan on hold.  Hold metoprolol.  Blood pressure on the lower side.  Hyperlipidemia -Hold statin for now  History of Crohn's disease -Continue mesalamine.  GI following  Anemia of chronic disease -From chronic illnesses.  Hemoglobin stable.  Monitor intermittently.  Hypoalbuminemia -From poor oral intake.  Goals of care -Patient is DNR.  Overall prognosis is guarded.  Consult palliative care for goals of care discussion   DVT prophylaxis: Lovenox Code Status: DNR Family Communication: None at bedside Disposition Plan: Status is: Inpatient Remains inpatient appropriate because: Of severity of illness    Consultants: Orthopedic/GI.  Consult general surgery and palliative care  Procedures: As above  Antimicrobials: Perioperative   Subjective: Patient seen and examined at bedside.  Still complains of intermittent abdominal pain and hip pain.  Had a very small bowel movement yesterday.  Denies any chest pain or fever.  Objective: Vitals:   11/06/22 2152 11/07/22 0456 11/07/22 0500 11/07/22 0756  BP:  101/61  116/62  Pulse: 79 75  78  Resp: 18 17  18   Temp: 98 F (36.7 C) 97.7 F (36.5 C)  (!) 97.5 F (36.4 C)  TempSrc: Oral Oral  Oral  SpO2: 100%   92%  Weight:   94.3 kg   Height:        Intake/Output Summary (Last 24 hours) at 11/07/2022 1047 Last data filed at 11/07/2022 0600 Gross per 24 hour  Intake --  Output 1000 ml  Net -1000 ml   [employer]  11/04/22 0500 11/06/22 0500 11/07/22 0500  Weight: 94.7 kg 97.4 kg 94.3 kg    Examination:  General exam: Appears calm  and comfortable.  Looks chronically ill and deconditioned on 4 L oxygen by nasal cannula. ENT: NG tube present Respiratory system: Bilateral decreased breath sounds at bases with scattered crackles Cardiovascular system: S1 & S2 heard, Rate controlled Gastrointestinal system: Abdomen is distended, soft and mildly tender.  Bowel sounds sluggish  extremities: No cyanosis, clubbing, edema  Central nervous system: Awake, slow to respond.  Poor historian.  No focal neurological deficits. Moving extremities Skin: No rashes, lesions or ulcers Psychiatry: Flat affect.  Not agitated.    Data Reviewed: I have personally reviewed following labs and imaging studies  CBC: Recent Labs  Lab 11/02/22 0744 11/04/22 0751 11/05/22 0830 11/06/22 0830 11/07/22 0854  WBC 13.2* 12.9* 10.1 11.0* 13.1*  HGB 8.8* 9.4* 8.5* 9.4* 9.4*  HCT 27.0* 28.7* 26.1* 29.1* 29.8*  MCV 87.9 88.3 88.2 89.3 89.8  PLT 179 249 253 346 353   Basic Metabolic Panel: Recent Labs  Lab 11/02/22 0744 11/04/22 0751 11/05/22 0830 11/06/22 0830 11/07/22 0854  NA 129* 126* 125* 125* 129*  K 3.9 4.7 4.7 4.8 4.9  CL 94* 91* 95* 92* 96*  CO2 24 25 21* 20* 24  GLUCOSE 149* 100* 100* 139* 91  BUN 50* 52* 56* 59* 57*  CREATININE 1.49* 1.63* 1.58* 1.61* 1.53*  CALCIUM 7.5* 7.8* 7.2* 7.7* 7.8*  MG 2.1 2.3 2.3 2.4 2.4   GFR: Estimated Creatinine Clearance: 28.3 mL/min (A) (by C-G formula based on SCr of 1.53 mg/dL (H)). Liver Function Tests: Recent Labs  Lab 11/07/22 0854  AST 23  ALT 13  ALKPHOS 80  BILITOT 1.2  PROT 5.5*  ALBUMIN 1.9*   No results for input(s): "LIPASE", "AMYLASE" in the last 168 hours. No results for input(s): "AMMONIA" in the last 168 hours. Coagulation Profile: No results for input(s): "INR", "PROTIME" in the last 168 hours. Cardiac Enzymes: No results for input(s): "CKTOTAL", "CKMB", "CKMBINDEX", "TROPONINI" in the last 168 hours. BNP (last 3 results) No results for input(s): "PROBNP" in the  last 8760 hours. HbA1C: No results for input(s): "HGBA1C" in the last 72 hours. CBG: No results for input(s): "GLUCAP" in the last 168 hours. Lipid Profile: No results for input(s): "CHOL", "HDL", "LDLCALC", "TRIG", "CHOLHDL", "LDLDIRECT" in the last 72 hours. Thyroid Function Tests: No results for input(s): "TSH", "T4TOTAL", "FREET4", "T3FREE", "THYROIDAB" in the last 72 hours. Anemia Panel: No results for input(s): "VITAMINB12", "FOLATE", "FERRITIN", "TIBC", "IRON", "RETICCTPCT" in the last 72 hours. Sepsis Labs: Recent Labs  Lab 11/02/22 0743  PROCALCITON 0.31    Recent Results (from the past 240 hour(s))  Surgical pcr screen     Status: None   Collection Time: 10/30/22 12:42 PM   Specimen: Nasal Mucosa; Nasal Swab  Result Value Ref Range Status   MRSA, PCR NEGATIVE NEGATIVE Final   Staphylococcus aureus NEGATIVE NEGATIVE Final    Comment: (NOTE) The Xpert SA Assay (FDA approved for NASAL specimens in patients 27 years of age and older), is one component of a comprehensive surveillance program. It is not intended to diagnose infection nor to guide or monitor treatment. Performed at [facility], [address], [place], [place] [address]          Radiology Studies: DG Abd Portable 1V-Small Bowel Obstruction Protocol-initial, 8 hr delay  Result Date: 11/07/2022 CLINICAL DATA:  8 hour small-bowel protocol EXAM: PORTABLE ABDOMEN -  1 VIEW COMPARISON:  11/06/2022 FINDINGS: Persistent gaseous dilatation of the bowel without significant change. Enteral contrast is present in the colon and rectum. Esophageal tube tip in the left upper quadrant IMPRESSION: Persistent gaseous dilatation of the bowel without significant change. Enteral contrast is present in the colon and rectum. Electronically Signed   By: Jasmine Pang M.D.   On: 11/07/2022 04:18   DG Abd Portable 1V-Small Bowel Protocol-Position Verification  Result Date: 11/06/2022 CLINICAL DATA:  NG tube placement.  EXAM: PORTABLE ABDOMEN - 1 VIEW COMPARISON:  Radiograph yesterday, CT 11/03/2022 FINDINGS: Tip and side port of the enteric tube below the diaphragm in the stomach. Gaseous bowel distention is similar to yesterday's exam. IMPRESSION: Tip and side port of the enteric tube below the diaphragm in the stomach. Electronically Signed   By: Narda Rutherford M.D.   On: 11/06/2022 14:38        Scheduled Meds:  atorvastatin  40 mg Oral QPM   enoxaparin (LOVENOX) injection  30 mg Subcutaneous Q24H   fluticasone  2 spray Each Nare Daily   mesalamine  1,000 mg Oral BID   metoprolol tartrate  75 mg Oral BID   mometasone-formoterol  2 puff Inhalation BID   montelukast  10 mg Oral QPM   pantoprazole (PROTONIX) IV  40 mg Intravenous Q24H   senna-docusate  1 tablet Oral BID   umeclidinium bromide  1 puff Inhalation Daily   Continuous Infusions:        Glade Lloyd, MD [facility] 11/07/2022, 10:47 AM

## 2022-11-07 NOTE — Progress Notes (Signed)
Subjective: Patient states she is passing flatus.  States she had a small bowel movement today. She states abdominal distention has improved with NG tube placement. She complains of pain from NG tube and from her recent hip surgery.  Objective: Vital signs in last 24 hours: Temp:  [97.5 F (36.4 C)-98 F (36.7 C)] 97.5 F (36.4 C) (10/30 0756) Pulse Rate:  [75-79] 78 (10/30 0756) Resp:  [17-18] 18 (10/30 0756) BP: (101-116)/(61-62) 116/62 (10/30 0756) SpO2:  [92 %-100 %] 92 % (10/30 0756) Weight:  [94.3 kg] 94.3 kg (10/30 0500) Weight change: -3.1 kg Last BM Date : 11/07/22  PE: Elderly, lying comfortably on bed, NG tube To intermittent wall suction GENERAL: Nonicteric, mild pallor  ABDOMEN:  mildly distended, very sluggish, intermittent high-pitched  occasional bowel sounds EXTREMITIES:No deformity  Lab Results: Results for orders placed or performed during the hospital encounter of 10/28/22 (from the past 48 hour(s))  CBC     Status: Abnormal   Collection Time: 11/06/22  8:30 AM  Result Value Ref Range   WBC 11.0 (H) 4.0 - 10.5 K/uL   RBC 3.26 (L) 3.87 - 5.11 MIL/uL   Hemoglobin 9.4 (L) 12.0 - 15.0 g/dL   HCT 40.9 (L) 81.1 - 91.4 %   MCV 89.3 80.0 - 100.0 fL   MCH 28.8 26.0 - 34.0 pg   MCHC 32.3 30.0 - 36.0 g/dL   RDW 78.2 (H) 95.6 - 21.3 %   Platelets 346 150 - 400 K/uL   nRBC 0.5 (H) 0.0 - 0.2 %    Comment: Performed at [facility], [address] [place], [place] [address]  Basic metabolic panel     Status: Abnormal   Collection Time: 11/06/22  8:30 AM  Result Value Ref Range   Sodium 125 (L) 135 - 145 mmol/L   Potassium 4.8 3.5 - 5.1 mmol/L   Chloride 92 (L) 98 - 111 mmol/L   CO2 20 (L) 22 - 32 mmol/L   Glucose, Bld 139 (H) 70 - 99 mg/dL    Comment: Glucose reference range applies only to samples taken after fasting for at least 8 hours.   BUN 59 (H) 8 - 23 mg/dL   Creatinine, Ser 8.46 (H) 0.44 - 1.00 mg/dL   Calcium 7.7 (L) 8.9 - 10.3 mg/dL   GFR,  Estimated 30 (L) >60 mL/min    Comment: (NOTE) Calculated using the CKD-EPI Creatinine Equation (2021)    Anion gap 13 5 - 15    Comment: Performed at [facility], [address] [place], [place] [address]  Magnesium     Status: None   Collection Time: 11/06/22  8:30 AM  Result Value Ref Range   Magnesium 2.4 1.7 - 2.4 mg/dL    Comment: Performed at [facility], [address] [place], [place] [address]  CBC     Status: Abnormal   Collection Time: 11/07/22  8:54 AM  Result Value Ref Range   WBC 13.1 (H) 4.0 - 10.5 K/uL   RBC 3.32 (L) 3.87 - 5.11 MIL/uL   Hemoglobin 9.4 (L) 12.0 - 15.0 g/dL   HCT 24.4 (L) 01.0 - 27.2 %   MCV 89.8 80.0 - 100.0 fL   MCH 28.3 26.0 - 34.0 pg   MCHC 31.5 30.0 - 36.0 g/dL   RDW 53.6 (H) 64.4 - 03.4 %   Platelets 353 150 - 400 K/uL   nRBC 0.4 (H) 0.0 - 0.2 %  Comment: Performed at [facility], [address] [place], [place] [address]  Comprehensive metabolic panel     Status: Abnormal   Collection Time: 11/07/22  8:54 AM  Result Value Ref Range   Sodium 129 (L) 135 - 145 mmol/L   Potassium 4.9 3.5 - 5.1 mmol/L   Chloride 96 (L) 98 - 111 mmol/L   CO2 24 22 - 32 mmol/L   Glucose, Bld 91 70 - 99 mg/dL    Comment: Glucose reference range applies only to samples taken after fasting for at least 8 hours.   BUN 57 (H) 8 - 23 mg/dL   Creatinine, Ser 6.04 (H) 0.44 - 1.00 mg/dL   Calcium 7.8 (L) 8.9 - 10.3 mg/dL   Total Protein 5.5 (L) 6.5 - 8.1 g/dL   Albumin 1.9 (L) 3.5 - 5.0 g/dL   AST 23 15 - 41 U/L   ALT 13 0 - 44 U/L   Alkaline Phosphatase 80 38 - 126 U/L   Total Bilirubin 1.2 0.3 - 1.2 mg/dL   GFR, Estimated 32 (L) >60 mL/min    Comment: (NOTE) Calculated using the CKD-EPI Creatinine Equation (2021)    Anion gap 9 5 - 15    Comment: Performed at [facility], [address], [place], [place] [address]  Magnesium     Status: None   Collection Time: 11/07/22  8:54 AM  Result Value Ref Range   Magnesium 2.4 1.7 -  2.4 mg/dL    Comment: Performed at [facility], [address], [place], [place] [address]    Studies/Results: DG Abd Portable 1V-Small Bowel Obstruction Protocol-initial, 8 hr delay  Result Date: 11/07/2022 CLINICAL DATA:  8 hour small-bowel protocol EXAM: PORTABLE ABDOMEN - 1 VIEW COMPARISON:  11/06/2022 FINDINGS: Persistent gaseous dilatation of the bowel without significant change. Enteral contrast is present in the colon and rectum. Esophageal tube tip in the left upper quadrant IMPRESSION: Persistent gaseous dilatation of the bowel without significant change. Enteral contrast is present in the colon and rectum. Electronically Signed   By: Jasmine Pang M.D.   On: 11/07/2022 04:18   DG Abd Portable 1V-Small Bowel Protocol-Position Verification  Result Date: 11/06/2022 CLINICAL DATA:  NG tube placement. EXAM: PORTABLE ABDOMEN - 1 VIEW COMPARISON:  Radiograph yesterday, CT 11/03/2022 FINDINGS: Tip and side port of the enteric tube below the diaphragm in the stomach. Gaseous bowel distention is similar to yesterday's exam. IMPRESSION: Tip and side port of the enteric tube below the diaphragm in the stomach. Electronically Signed   By: Narda Rutherford M.D.   On: 11/06/2022 14:38    Medications: I have reviewed the patient's current medications.  Assessment: Postop ileus, abdominal x-ray today shows persistent gaseous dilation of bowel without significant change with contrast seen in colon and rectum  Potassium normal 4.9, magnesium normal 2.4 Stable hemoglobin 9.4  Status post ORIF on 10/30/2022 for displaced right intertrochanteric femur fracture after mechanical fall  History of Crohn's disease-restarted on Pentasa 1000 mg twice a day from today  Plan: Continue conservative management with NG tube to intermittent suction, serial abdominal x-ray every 24 hours, IV fluids, keep potassium above 4, minimize use of narcotics, early ambulation if possible.  Kerin Salen,  MD 11/07/2022, 12:32 PM

## 2022-11-07 NOTE — Progress Notes (Signed)
Physical Therapy Treatment Patient Details Name: Felicia Benson MRN: [id number] DOB: 1930-02-15 Today's Date: 11/07/2022   History of Present Illness Pt is a 89 y/o female presenting on 10/20 after fall.  Found with R hip fx, s/p IM nail 10/22.  PMH includes: chronic hypoxic respiratory failure on baseline and 4 L continuous nasal cannula, COPD, chronic diastolic heart failure, essential pretension, hyperlipidemia anemia of chronic disease is a baseline hemoglobin range 10-12.    PT Comments  Pt able to tolerate getting to chair with use of Stedy. Pt with difficulty understanding instructions on how to assist with [facility] and was pushing against the Stedy instead of pulling up on it. Patient will benefit from continued inpatient follow up therapy, <3 hours/day     If plan is discharge home, recommend the following: Two people to help with walking and/or transfers;Assist for transportation;Two people to help with bathing/dressing/bathroom   Can travel by private vehicle     No  Equipment Recommendations  Wheelchair (measurements PT);Wheelchair cushion (measurements PT);Hospital bed;Hoyer lift    Recommendations for Other Services       Precautions / Restrictions Precautions Precautions: Fall;Other (comment) Precaution Comments: BPs soft, oxygen 4L at baseline Restrictions Weight Bearing Restrictions: Yes RLE Weight Bearing: Weight bearing as tolerated     Mobility  Bed Mobility Overal bed mobility: Needs Assistance Bed Mobility: Supine to Sit Rolling: +2 for physical assistance, Max assist         General bed mobility comments: Assist to bring legs off bed, elevate trunk into sitting and bring hips to EOB    Transfers Overall transfer level: Needs assistance Equipment used: Ambulation equipment used Transfers: Sit to/from Stand, Bed to chair/wheelchair/BSC Sit to Stand: +2 physical assistance, Max assist, From elevated surface           General transfer comment:  Use of bed pad under hips to power up with Stedy. Pt pushing with UE's against Stedy instead of pulling herself up despite verbal/visual/tactile cues. Used [employer] for bed to Engineer, technical sales Bed    Modified Rankin (Stroke Patients Only)       Balance Overall balance assessment: Needs assistance Sitting-balance support: No upper extremity supported Sitting balance-Leahy Scale: Fair Sitting balance - Comments: loss of balance with dynamic when picking up feet to put on Stedy   Standing balance support: Bilateral upper extremity supported, During functional activity Standing balance-Leahy Scale: Zero Standing balance comment: +2 max assist to stand with Stedy                            Cognition Arousal: Alert Behavior During Therapy: [facility] for tasks assessed/performed Overall Cognitive Status: Impaired/Different from baseline Area of Impairment: Following commands, Problem solving                       Following Commands: Follows multi-step commands inconsistently     Problem Solving: Slow processing, Difficulty sequencing, Requires verbal cues, Requires tactile cues          Exercises      General Comments        Pertinent Vitals/Pain Pain Assessment Pain Assessment: Faces Faces Pain Scale: Hurts little more Pain Location: Rt  hip with mobility Pain Descriptors / Indicators: Guarding, Grimacing Pain Intervention(s): Limited activity within patient's tolerance, Monitored during session, Repositioned    Home Living                          Prior Function            PT Goals (current goals can now be found in the care plan section) Acute Rehab PT Goals Patient Stated Goal: not stated Progress towards PT goals: Progressing toward goals    Frequency    Min 1X/week      PT Plan      Co-evaluation               AM-PAC PT "6 Clicks" Mobility   Outcome Measure  Help needed turning from your back to your side while in a flat bed without using bedrails?: A Lot Help needed moving from lying on your back to sitting on the side of a flat bed without using bedrails?: Total Help needed moving to and from a bed to a chair (including a wheelchair)?: Total Help needed standing up from a chair using your arms (e.g., wheelchair or bedside chair)?: Total Help needed to walk in hospital room?: Total Help needed climbing 3-5 steps with a railing? : Total 6 Click Score: 7    End of Session Equipment Utilized During Treatment: Gait belt;Oxygen Activity Tolerance: Patient tolerated treatment well Patient left: in chair;with call bell/phone within reach;with chair alarm set Nurse Communication: Mobility status;Need for lift equipment PT Visit Diagnosis: Unsteadiness on feet (R26.81);History of falling (Z91.81);Muscle weakness (generalized) (M62.81);Pain Pain - Right/Left: Right Pain - part of body: Hip     Time: 1200-1217 PT Time Calculation (min) (ACUTE ONLY): 17 min  Charges:    $Therapeutic Activity: 8-22 mins PT General Charges $$ ACUTE PT VISIT: 1 Visit                     [facility] PT Acute Rehabilitation Services Office [phone]    Angelina Ok [facility] 11/07/2022, 1:41 PM

## 2022-11-07 NOTE — Consult Note (Signed)
Consultation Note Date: 11/07/2022   Patient Name: Felicia Benson  DOB: 1930/11/29  MRN: [id number]  Age / Sex: 89 y.o., female  PCP: [name], MD Referring Physician: Glade Lloyd, MD  Reason for Consultation: Establishing goals of care  HPI/Patient Profile: 89 y.o. female   admitted on 10/28/2022 with   past medical history significant for chronic respiratory failure/COPD on 4 L nasal cannula at baseline, chronic diastolic congestive heart failure, HTN, HLD, anemia of chronic medical disease presented with right hip and knee pain following a mechanical fall.    She was found to have acute displaced right intertrochanteric femoral fracture.    CT head/C-spine showed no acute intracranial abnormality, stable heterogeneous enlargement of right thyroid that is most likely indicative of multinodular goiter.    She underwent ORIF by orthopedics on 10/30/2022.  Subsequently, PT recommended SNF placement.    Hospital course complicated by ileus.  NG tube placed on 11/06/2022.  GI consulted.   Patient and family face treatment option decisions, advanced directive decisions and anticipatory care needs.  Clinical Assessment and Goals of Care:  This NP Lorinda Creed reviewed medical records, received report from team, assessed the patient and then meet at the patient's bedside along with her daughter/Felicia Benson   to discuss diagnosis, prognosis, GOC, EOL wishes disposition and options.   Concept of Palliative Care was introduced as specialized medical care for people and their families living with serious illness.  If focuses on providing relief from the symptoms and stress of a serious illness.  The goal is to improve quality of life for both the patient and the family.  Values and goals of care important to patient and family were attempted to be elicited.  Education offered on patient's current medical  situation specifically to recurrent ileus and inability to support herself from a nutrition and hydration standpoint, 2/2 to the ileus.  Created space and opportunity for patient  and family to explore thoughts and feelings regarding current medical situation.  Patient verbalizes understanding of the seriousness of her current medical situation.  She is a retired Designer, jewellery.  She realizes that her age and comorbidities are barriers to return to baseline however at this time she remains open to all offered and available medical interventions to prolong life.  Patient remains high risk for decompensation  Education offered on concept specific to human mortality and adult failure to thrive..  We discussed the limitations of medical interventions to prolong quality of life when the body does fail to thrive.    Ultimately patient is hopeful for continued improvement, medical stabilization, discharged to skilled nursing facility for short-term rehab.  She hopes to return home to live with her daughter     A  discussion was had today regarding advanced directives.  Concepts specific to code status, artifical feeding and hydration, continued IV antibiotics and rehospitalization was had.  The difference between a aggressive medical intervention path  and a palliative comfort care path for this patient at this time was had.  MOST form introduced and hard choices booklet left for review   Natural trajectory and expectations at EOL were discussed.  Questions and concerns addressed.  Patient  encouraged to call with questions or concerns.     PMT will continue to support holistically.  Daughter is hopeful for improvement, will monitor patient's outcomes over the next several days and willing to meet with PMT again early next week.          No documented H POA at this time.   Advance care planning booklet reviewed and left at bedside.  Patient verbalizes that she wishes her daughter Felicia Benson  to be her H POA      SUMMARY OF RECOMMENDATIONS    Code Status/Advance Care Planning: DNR   Symptom Management:  Per attending  Palliative Prophylaxis:  Aspiration, Bowel Regimen, Delirium Protocol, and Frequent Pain Assessment  Additional Recommendations (Limitations, Scope, Preferences): Full Scope Treatment  Psycho-social/Spiritual:  Desire for further Chaplaincy support:yes   Prognosis:  Unable to determine  Discharge Planning: To Be Determined      Primary Diagnoses: Present on Admission:  Closed right hip fracture (HCC)  Chronic hypoxic respiratory failure (HCC)  Chronic diastolic CHF (congestive heart failure) (HCC)  Essential hypertension  Hyperlipidemia  Allergic rhinitis  Anemia of chronic disease   I have reviewed the medical record, interviewed the patient and family, and examined the patient. The following aspects are pertinent.  Past Medical History:  Diagnosis Date   AAA (abdominal aortic aneurysm) (HCC)    CHF (congestive heart failure) (HCC)    Crohn's disease (HCC)    Diverticulitis    Diverticulosis    GERD (gastroesophageal reflux disease)    GI bleed    Hypertension    OSA (obstructive sleep apnea)    Pacemaker    Thyroid nodule    Social History   Socioeconomic History   Marital status: Widowed    Spouse name: Not on file   Number of children: Not on file   Years of education: Not on file   Highest education level: Not on file  Occupational History   Not on file  Tobacco Use   Smoking status: Former   Smokeless tobacco: Never  Vaping Use   Vaping status: Never Used  Substance and Sexual Activity   Alcohol use: No    Comment: Quit in 1985   Drug use: No   Sexual activity: Not on file  Other Topics Concern   Not on file  Social History Narrative   Not on file   Social Determinants of Health   Financial Resource Strain: Not on file  Food Insecurity: No Food Insecurity (10/29/2022)   Hunger Vital Sign    Worried  About Running Out of Food in the Last Year: Never true    Ran Out of Food in the Last Year: Never true  Transportation Needs: No Transportation Needs (10/29/2022)   PRAPARE - Administrator, Civil Service (Medical): No    Lack of Transportation (Non-Medical): No  Physical Activity: Not on file  Stress: Not on file  Social Connections: Not on file   Family History  Problem Relation Age of Onset   CVA Mother    Lung cancer Father    Colon cancer Neg Hx    Scheduled Meds:  enoxaparin (LOVENOX) injection  30 mg Subcutaneous Q24H   fluticasone  2 spray Each Nare Daily   mesalamine  1,000 mg Oral BID   mometasone-formoterol  2 puff Inhalation  BID   montelukast  10 mg Oral QPM   pantoprazole (PROTONIX) IV  40 mg Intravenous Q24H   senna-docusate  1 tablet Oral BID   umeclidinium bromide  1 puff Inhalation Daily   Continuous Infusions: PRN Meds:.acetaminophen **OR** acetaminophen, albuterol, alum & mag hydroxide-simeth, HYDROmorphone (DILAUDID) injection, melatonin, metoprolol tartrate, naLOXone (NARCAN)  injection, ondansetron (ZOFRAN) IV, oxyCODONE, polyethylene glycol, prochlorperazine Medications Prior to Admission:  Prior to Admission medications   Medication Sig Start Date End Date Taking? Authorizing Provider  acetaminophen (TYLENOL) 500 MG tablet Take 1,000 mg by mouth daily.   Yes [provider]  albuterol (VENTOLIN HFA) 108 (90 Base) MCG/ACT inhaler Inhale 2 puffs into the lungs every 6 (six) hours as needed for wheezing or shortness of breath. 07/24/22  Yes [name], MD  amLODipine (NORVASC) 10 MG tablet Take 1 tablet (10 mg total) by mouth every evening. Patient taking differently: Take 10 mg by mouth daily. 07/24/22  Yes [name], MD  aspirin EC 81 MG tablet Take 1 tablet (81 mg total) by mouth daily. Swallow whole. 03/29/21  Yes [name], MD  atorvastatin (LIPITOR) 40 MG tablet Take 40 mg by mouth every evening.   Yes [provider]  Cholecalciferol (VITAMIN D3) 5000 units TABS Take 5,000 Units by mouth daily.    Yes [provider]  docusate sodium (COLACE) 100 MG capsule Take 2 capsules (200 mg total) by mouth 2 (two) times daily. Patient taking differently: Take 200 mg by mouth daily. 07/24/22  Yes [name], MD  fluticasone [facility]) 50 MCG/ACT nasal spray Place 2 sprays into both nostrils daily. Patient taking differently: Place 2 sprays into both nostrils daily as needed for allergies. 08/06/22  Yes Cobb, Ruby Cola, NP  furosemide (LASIX) 40 MG tablet Take 1.5 tablets (60 mg total) by mouth daily. 07/24/22  Yes [name], MD  losartan (COZAAR) 25 MG tablet Take 0.5 tablets (12.5 mg total) by mouth daily. 07/24/22  Yes [name], MD  melatonin 3 MG TABS tablet Take 1 tablet (3 mg total) by mouth at bedtime. Patient taking differently: Take 3 mg by mouth at bedtime as needed (sleep). 07/24/22  Yes [name], MD  mesalamine (PENTASA) 500 MG CR capsule Take 1,000 mg by mouth 2 (two) times daily.   Yes [provider]  Metoprolol Tartrate 75 MG TABS Take 75 mg by mouth 2 (two) times daily.   Yes [provider]  mometasone-formoterol (DULERA) 100-5 MCG/ACT AERO Inhale 2 puffs into the lungs 2 (two) times daily.   Yes [provider]  montelukast (SINGULAIR) 10 MG tablet Take 1 tablet (10 mg total) by mouth daily. Patient taking differently: Take 10 mg by mouth every evening. 08/01/21  Yes [name], Adewale A, MD  pantoprazole (PROTONIX) 40 MG tablet Take 40 mg by mouth 2 (two) times daily.   Yes [provider]  polyethylene glycol (MIRALAX / GLYCOLAX) 17 g packet Take 17 g by mouth daily as needed. Patient taking differently: Take 17 g by mouth daily as needed for mild constipation. 07/24/22  Yes [name], MD  Propyl Glycol-Hydroxyethylcell (NASAL MOIST) GEL Place 1 application  into the nose 2 (two) times daily.   Yes [provider]    Allergies  Allergen Reactions   Ibuprofen     Other reaction(s): gi bleed   Ace Inhibitors     Other reaction(s): cough   Breztri Aerosphere [Budeson-Glycopyrrol-Formoterol] Hypertension   Ciprofloxacin     Other Reaction(s):  avoid fluoroquinolones due to asc aortic aneurysm   Levofloxacin     Other Reaction(s): aneurysm   Nsaids Nausea And Vomiting    Pt has preference to tylonel   Review of Systems  Neurological:  Positive for weakness.    Physical Exam Constitutional:      Appearance: She is normal weight. She is ill-appearing.  Cardiovascular:     Rate and Rhythm: Normal rate.  Pulmonary:     Effort: Pulmonary effort is normal.  Skin:    General: Skin is warm and dry.  Neurological:     Mental Status: She is alert and oriented to person, place, and time.     Comments: Oriented to person and place  Psychiatric:        Cognition and Memory: Cognition normal.     Vital Signs: BP 116/62 (BP Location: Left Arm)   Pulse 78   Temp (!) 97.5 F (36.4 C) (Oral)   Resp 18   Ht 5\' 6"  (1.676 m)   Wt 94.3 kg Comment: bed weight  SpO2 92%   BMI 33.55 kg/m  Pain Scale: 0-10 POSS *See Group Information*: 1-Acceptable,Awake and alert Pain Score: 8    SpO2: SpO2: 92 % O2 Device:SpO2: 92 % O2 Flow Rate: .O2 Flow Rate (L/min): 4 L/min  IO: Intake/output summary:  Intake/Output Summary (Last 24 hours) at 11/07/2022 1113 Last data filed at 11/07/2022 0600 Gross per 24 hour  Intake --  Output 1000 ml  Net -1000 ml    LBM: Last BM Date : 11/07/22 Baseline Weight: Weight: 84.4 kg Most recent weight: Weight: 94.3 kg (bed weight)     Palliative Assessment/Data:  30 %     Discussed with Dr. Hanley Ben and transition of care team via secure chat   Time 75 minutes  Signed by: Lorinda Creed, NP   Please contact Palliative Medicine Team phone at [phone] for questions and concerns.  For individual provider: See Loretha Stapler

## 2022-11-08 ENCOUNTER — Inpatient Hospital Stay (HOSPITAL_COMMUNITY): Payer: Medicare Other

## 2022-11-08 DIAGNOSIS — K567 Ileus, unspecified: Secondary | ICD-10-CM | POA: Diagnosis not present

## 2022-11-08 DIAGNOSIS — K9189 Other postprocedural complications and disorders of digestive system: Secondary | ICD-10-CM | POA: Diagnosis not present

## 2022-11-08 DIAGNOSIS — R531 Weakness: Secondary | ICD-10-CM | POA: Diagnosis not present

## 2022-11-08 DIAGNOSIS — Z515 Encounter for palliative care: Secondary | ICD-10-CM | POA: Diagnosis not present

## 2022-11-08 DIAGNOSIS — I5032 Chronic diastolic (congestive) heart failure: Secondary | ICD-10-CM | POA: Diagnosis not present

## 2022-11-08 DIAGNOSIS — S72001D Fracture of unspecified part of neck of right femur, subsequent encounter for closed fracture with routine healing: Secondary | ICD-10-CM | POA: Diagnosis not present

## 2022-11-08 LAB — CBC WITH DIFFERENTIAL/PLATELET
Abs Immature Granulocytes: 0.11 10*3/uL — ABNORMAL HIGH (ref 0.00–0.07)
Basophils Absolute: 0 10*3/uL (ref 0.0–0.1)
Basophils Relative: 0 %
Eosinophils Absolute: 0.2 10*3/uL (ref 0.0–0.5)
Eosinophils Relative: 1 %
HCT: 30.6 % — ABNORMAL LOW (ref 36.0–46.0)
Hemoglobin: 9.5 g/dL — ABNORMAL LOW (ref 12.0–15.0)
Immature Granulocytes: 1 %
Lymphocytes Relative: 7 %
Lymphs Abs: 0.8 10*3/uL (ref 0.7–4.0)
MCH: 28.2 pg (ref 26.0–34.0)
MCHC: 31 g/dL (ref 30.0–36.0)
MCV: 90.8 fL (ref 80.0–100.0)
Monocytes Absolute: 1.3 10*3/uL — ABNORMAL HIGH (ref 0.1–1.0)
Monocytes Relative: 11 %
Neutro Abs: 9.3 10*3/uL — ABNORMAL HIGH (ref 1.7–7.7)
Neutrophils Relative %: 80 %
Platelets: 363 10*3/uL (ref 150–400)
RBC: 3.37 MIL/uL — ABNORMAL LOW (ref 3.87–5.11)
RDW: 16.7 % — ABNORMAL HIGH (ref 11.5–15.5)
WBC: 11.7 10*3/uL — ABNORMAL HIGH (ref 4.0–10.5)
nRBC: 0.3 % — ABNORMAL HIGH (ref 0.0–0.2)

## 2022-11-08 LAB — BASIC METABOLIC PANEL
Anion gap: 9 (ref 5–15)
BUN: 56 mg/dL — ABNORMAL HIGH (ref 8–23)
CO2: 22 mmol/L (ref 22–32)
Calcium: 7.6 mg/dL — ABNORMAL LOW (ref 8.9–10.3)
Chloride: 98 mmol/L (ref 98–111)
Creatinine, Ser: 1.37 mg/dL — ABNORMAL HIGH (ref 0.44–1.00)
GFR, Estimated: 37 mL/min — ABNORMAL LOW (ref >60)
Glucose, Bld: 131 mg/dL — ABNORMAL HIGH (ref 70–99)
Potassium: 4.6 mmol/L (ref 3.5–5.1)
Sodium: 129 mmol/L — ABNORMAL LOW (ref 135–145)

## 2022-11-08 LAB — MAGNESIUM: Magnesium: 2.4 mg/dL (ref 1.7–2.4)

## 2022-11-08 MED ORDER — GUAIFENESIN 100 MG/5ML PO LIQD
5.0000 mL | ORAL | Status: DC | PRN
  Administered 2022-11-08 – 2022-11-11 (×2): 5 mL via ORAL
  Filled 2022-11-08 (×3): qty 5

## 2022-11-08 NOTE — Progress Notes (Signed)
Subjective: Patient reports having 2 soft bowel movements yesterday and has been passing flatus. She states she was out of bed to chair for 30 minutes with assistance. She has not been able to ambulate. Denies abdominal pain. States hip pain is better.  Objective: Vital signs in last 24 hours: Temp:  [97.5 F (36.4 C)-97.8 F (36.6 C)] 97.5 F (36.4 C) (10/31 0811) Pulse Rate:  [80-90] 90 (10/31 0811) Resp:  [15-18] 15 (10/31 0811) BP: (84-100)/(54-73) 95/64 (10/31 0811) SpO2:  [90 %-97 %] 97 % (10/31 0811) Weight change:  Last BM Date : 11/07/22  PE: Elderly but alert and oriented x 3 GENERAL: NG tube connected to intermittent wall suction, able to speak in full sentences, not in distress ABDOMEN: Less distended than yesterday, bowel sounds sluggish but present, nontender EXTREMITIES: No deformity  Lab Results: Results for orders placed or performed during the hospital encounter of 10/28/22 (from the past 48 hour(s))  CBC     Status: Abnormal   Collection Time: 11/06/22  8:30 AM  Result Value Ref Range   WBC 11.0 (H) 4.0 - 10.5 K/uL   RBC 3.26 (L) 3.87 - 5.11 MIL/uL   Hemoglobin 9.4 (L) 12.0 - 15.0 g/dL   HCT 16.1 (L) 09.6 - 04.5 %   MCV 89.3 80.0 - 100.0 fL   MCH 28.8 26.0 - 34.0 pg   MCHC 32.3 30.0 - 36.0 g/dL   RDW 40.9 (H) 81.1 - 91.4 %   Platelets 346 150 - 400 K/uL   nRBC 0.5 (H) 0.0 - 0.2 %    Comment: Performed at [facility], [address] [place], [place] [address]  Basic metabolic panel     Status: Abnormal   Collection Time: 11/06/22  8:30 AM  Result Value Ref Range   Sodium 125 (L) 135 - 145 mmol/L   Potassium 4.8 3.5 - 5.1 mmol/L   Chloride 92 (L) 98 - 111 mmol/L   CO2 20 (L) 22 - 32 mmol/L   Glucose, Bld 139 (H) 70 - 99 mg/dL    Comment: Glucose reference range applies only to samples taken after fasting for at least 8 hours.   BUN 59 (H) 8 - 23 mg/dL   Creatinine, Ser 6.21 (H) 0.44 - 1.00 mg/dL   Calcium 7.7 (L) 8.9 - 10.3 mg/dL   GFR,  Estimated 30 (L) >60 mL/min    Comment: (NOTE) Calculated using the CKD-EPI Creatinine Equation (2021)    Anion gap 13 5 - 15    Comment: Performed at [facility], [address] [place], [place] [address]  Magnesium     Status: None   Collection Time: 11/06/22  8:30 AM  Result Value Ref Range   Magnesium 2.4 1.7 - 2.4 mg/dL    Comment: Performed at [facility], [address] [place], [place] [address]  CBC     Status: Abnormal   Collection Time: 11/07/22  8:54 AM  Result Value Ref Range   WBC 13.1 (H) 4.0 - 10.5 K/uL   RBC 3.32 (L) 3.87 - 5.11 MIL/uL   Hemoglobin 9.4 (L) 12.0 - 15.0 g/dL   HCT 62.9 (L) 52.8 - 41.3 %   MCV 89.8 80.0 - 100.0 fL   MCH 28.3 26.0 - 34.0 pg   MCHC 31.5 30.0 - 36.0 g/dL   RDW 24.4 (H) 01.0 - 27.2 %   Platelets 353 150 - 400 K/uL   nRBC 0.4 (H) 0.0 - 0.2 %  Comment: Performed at [facility], [address], [place], [place] [address]  Comprehensive metabolic panel     Status: Abnormal   Collection Time: 11/07/22  8:54 AM  Result Value Ref Range   Sodium 129 (L) 135 - 145 mmol/L   Potassium 4.9 3.5 - 5.1 mmol/L   Chloride 96 (L) 98 - 111 mmol/L   CO2 24 22 - 32 mmol/L   Glucose, Bld 91 70 - 99 mg/dL    Comment: Glucose reference range applies only to samples taken after fasting for at least 8 hours.   BUN 57 (H) 8 - 23 mg/dL   Creatinine, Ser 7.82 (H) 0.44 - 1.00 mg/dL   Calcium 7.8 (L) 8.9 - 10.3 mg/dL   Total Protein 5.5 (L) 6.5 - 8.1 g/dL   Albumin 1.9 (L) 3.5 - 5.0 g/dL   AST 23 15 - 41 U/L   ALT 13 0 - 44 U/L   Alkaline Phosphatase 80 38 - 126 U/L   Total Bilirubin 1.2 0.3 - 1.2 mg/dL   GFR, Estimated 32 (L) >60 mL/min    Comment: (NOTE) Calculated using the CKD-EPI Creatinine Equation (2021)    Anion gap 9 5 - 15    Comment: Performed at [facility], [address], [place], [place] [address]  Magnesium     Status: None   Collection Time: 11/07/22  8:54 AM  Result Value Ref Range   Magnesium 2.4 1.7 -  2.4 mg/dL    Comment: Performed at [facility], [address] [place], [place] [address]    Studies/Results: DG Abd Portable 1V-Small Bowel Obstruction Protocol-initial, 8 hr delay  Result Date: 11/07/2022 CLINICAL DATA:  8 hour small-bowel protocol EXAM: PORTABLE ABDOMEN - 1 VIEW COMPARISON:  11/06/2022 FINDINGS: Persistent gaseous dilatation of the bowel without significant change. Enteral contrast is present in the colon and rectum. Esophageal tube tip in the left upper quadrant IMPRESSION: Persistent gaseous dilatation of the bowel without significant change. Enteral contrast is present in the colon and rectum. Electronically Signed   By: Jasmine Pang M.D.   On: 11/07/2022 04:18   DG Abd Portable 1V-Small Bowel Protocol-Position Verification  Result Date: 11/06/2022 CLINICAL DATA:  NG tube placement. EXAM: PORTABLE ABDOMEN - 1 VIEW COMPARISON:  Radiograph yesterday, CT 11/03/2022 FINDINGS: Tip and side port of the enteric tube below the diaphragm in the stomach. Gaseous bowel distention is similar to yesterday's exam. IMPRESSION: Tip and side port of the enteric tube below the diaphragm in the stomach. Electronically Signed   By: Narda Rutherford M.D.   On: 11/06/2022 14:38    Medications: I have reviewed the patient's current medications.  Assessment: Postop ileus-seems to be resolving as patient reports having bowel movements Abdominal x-ray performed today, results pending   History of Crohn's disease-although Pentasa has been ordered orally, patient has not taken any oral meds as NG tube is connected to intermittent suction.  Recent hip fracture with ORIF on 10/30/2022  Plan: Will follow abdominal x-ray from today. If there is resolution of ileus/distention, we may be able to take NG tube out and start patient on clear liquids. If small bowel and colonic distention persist, she may need NG tube placement for another 24 hours. Although Pentasa has been ordered, it has  not been given yet, okay to wait until NG tube is removed to resume Pentasa for history of Crohn's disease.  Kerin Salen, MD 11/08/2022, 8:16 AM

## 2022-11-08 NOTE — Plan of Care (Signed)
  Problem: Clinical Measurements: Goal: Ability to maintain clinical measurements within normal limits will improve Outcome: Progressing Goal: Will remain free from infection Outcome: Progressing Goal: Diagnostic test results will improve Outcome: Progressing Goal: Respiratory complications will improve Outcome: Progressing Goal: Cardiovascular complication will be avoided Outcome: Progressing   Problem: Activity: Goal: Risk for activity intolerance will decrease Outcome: Progressing   Problem: Nutrition: Goal: Adequate nutrition will be maintained Outcome: Not Progressing   Problem: Coping: Goal: Level of anxiety will decrease Outcome: Progressing   Problem: Elimination: Goal: Will not experience complications related to bowel motility Outcome: Progressing Goal: Will not experience complications related to urinary retention Outcome: Progressing   Problem: Pain Managment: Goal: General experience of comfort will improve Outcome: Progressing   Problem: Safety: Goal: Ability to remain free from injury will improve Outcome: Progressing   Problem: Skin Integrity: Goal: Risk for impaired skin integrity will decrease Outcome: Progressing   Problem: Skin Integrity: Goal: Risk for impaired skin integrity will decrease Outcome: Progressing

## 2022-11-08 NOTE — Progress Notes (Signed)
Occupational Therapy Treatment Patient Details Name: Felicia Benson MRN: [id number] DOB: 1930-05-31 Today's Date: 11/08/2022   History of present illness Pt is a 89 y/o female presenting on 10/20 after fall.  Found with R hip fx, s/p IM nail 10/22.  PMH includes: chronic hypoxic respiratory failure on baseline and 4 L continuous nasal cannula, COPD, chronic diastolic heart failure, essential pretension, hyperlipidemia anemia of chronic disease is a baseline hemoglobin range 10-12.   OT comments  Patient with complaints of throat pain she believes is from NG tube but agreeable to OT treatment. Patient instructed on side lying to sitting on EOB with mod assist +2 and patient assisting with trunk using rail. Once on EOB patient required CGA for sitting balance but progressed to supervision. Patient performed sit to stand from EOB into [place] with max assist +2 and use of bed pads. Patient transferred to recliner and stood in stedy for peri area bathing with limited standing tolerance before sitting in recliner. Patient is demonstrating gains with bed mobility and transfers. Acute OT to continue to follow. Patient will benefit from continued inpatient follow up therapy, <3 hours/day.       If plan is discharge home, recommend the following:  Two people to help with walking and/or transfers;Two people to help with bathing/dressing/bathroom   Equipment Recommendations  Other (comment) (defer)    Recommendations for Other Services      Precautions / Restrictions Precautions Precautions: Fall;Other (comment) Precaution Comments: BPs soft, oxygen 4L at baseline Restrictions Weight Bearing Restrictions: Yes RLE Weight Bearing: Weight bearing as tolerated       Mobility Bed Mobility Overal bed mobility: Needs Assistance Bed Mobility: Sidelying to Sit   Sidelying to sit: Mod assist, +2 for physical assistance, Used rails       General bed mobility comments: patient requiring assistance  with BLEs and trunk with patient assisting with rail use. Bed pad used to assist with scooting to EOB    Transfers Overall transfer level: Needs assistance Equipment used: Ambulation equipment used Transfers: Sit to/from Stand, Bed to chair/wheelchair/BSC Sit to Stand: +2 physical assistance, Max assist, From elevated surface           General transfer comment: Max assist +2 and use of bed pads to stand from EOB and stedy Transfer via Lift Equipment: Stedy   Balance Overall balance assessment: Needs assistance Sitting-balance support: No upper extremity supported Sitting balance-Leahy Scale: Fair Sitting balance - Comments: CGA initially but progressed to supervision   Standing balance support: Bilateral upper extremity supported, During functional activity Standing balance-Leahy Scale: Zero Standing balance comment: reliant on stedy for standing and max assist +2                           ADL either performed or assessed with clinical judgement   ADL Overall ADL's : Needs assistance/impaired     Grooming: Wash/dry face;Set up;Sitting       Lower Body Bathing: Total assistance;+2 for physical assistance;Sit to/from stand Lower Body Bathing Details (indicate cue type and reason): stood in [place] for peri area cleaning assistance of 2 for standing                            Extremity/Trunk Assessment              Vision       Perception     Praxis  Cognition Arousal: Alert Behavior During Therapy: WFL for tasks assessed/performed Overall Cognitive Status: Impaired/Different from baseline Area of Impairment: Following commands, Problem solving                       Following Commands: Follows multi-step commands inconsistently     Problem Solving: Slow processing, Difficulty sequencing, Requires verbal cues, Requires tactile cues          Exercises      Shoulder Instructions       General Comments       Pertinent Vitals/ Pain       Pain Assessment Pain Assessment: 0-10 Pain Score: 9  Faces Pain Scale: Hurts little more Pain Location: Right hip with mobility and throat Pain Descriptors / Indicators: Sore, Grimacing, Guarding Pain Intervention(s): Limited activity within patient's tolerance, Monitored during session, Repositioned, RN gave pain meds during session  Home Living                                          Prior Functioning/Environment              Frequency  Min 1X/week        Progress Toward Goals  OT Goals(current goals can now be found in the care plan section)  Progress towards OT goals: Progressing toward goals  Acute Rehab OT Goals Patient Stated Goal: less pain OT Goal Formulation: With patient Time For Goal Achievement: 11/15/22 Potential to Achieve Goals: Fair ADL Goals Pt Will Perform Grooming: with set-up;sitting Pt Will Perform Upper Body Dressing: with set-up;sitting Pt Will Transfer to Toilet: with max assist;with +2 assist;stand pivot transfer;bedside commode Additional ADL Goal #1: Pt will complete bed mobility with mod assist +2. Additional ADL Goal #2: Pt will maintain sitting balance at EOB during ADLs with no more than supervision for 5 minutes.  Plan      Co-evaluation                 AM-PAC OT "6 Clicks" Daily Activity     Outcome Measure   Help from another person eating meals?: A Lot Help from another person taking care of personal grooming?: A Lot Help from another person toileting, which includes using toliet, bedpan, or urinal?: Total Help from another person bathing (including washing, rinsing, drying)?: A Lot Help from another person to put on and taking off regular upper body clothing?: A Lot Help from another person to put on and taking off regular lower body clothing?: Total 6 Click Score: 10    End of Session Equipment Utilized During Treatment: Oxygen  OT Visit Diagnosis: Other  abnormalities of gait and mobility (R26.89);Muscle weakness (generalized) (M62.81);Pain;History of falling (Z91.81) Pain - Right/Left: Right Pain - part of body: Hip   Activity Tolerance Patient tolerated treatment well   Patient Left in chair;with call bell/phone within reach;with chair alarm set   Nurse Communication Mobility status;Need for lift equipment        Time: 2956-2130 OT Time Calculation (min): 20 min  Charges: OT General Charges $OT Visit: 1 Visit OT Treatments $Therapeutic Activity: 8-22 mins  Alfonse Flavors, OTA Acute Rehabilitation Services  Office [phone]   Dewain Penning 11/08/2022, 2:41 PM

## 2022-11-08 NOTE — TOC Progression Note (Addendum)
Transition of Care [facility]) - Progression Note    Patient Details  Name: Felicia Benson MRN: [id number] Date of Birth: 07/02/1930  Transition of Care [facility]) CM/SW Contact  Lorri Frederick, LCSW Phone Number: 11/08/2022, 8:20 AM  Clinical Narrative:   CSW message with [facility] SN[facility].  They are still planning to admit pt once medically stable.  TOC will continue to follow.    1315: Daughter [name] updated by phone.    Expected Discharge Plan: Skilled Nursing Facility Barriers to Discharge: Continued Medical Work up, SNF Pending bed offer  Expected Discharge Plan and Services In-house Referral: Clinical Social Work   Post Acute Care Choice: Skilled Nursing Facility Living arrangements for the past 2 months: Single Family Home                                       Social Determinants of Health (SDOH) Interventions SDOH Screenings   Food Insecurity: No Food Insecurity (10/29/2022)  Housing: Low Risk  (10/29/2022)  Transportation Needs: No Transportation Needs (10/29/2022)  Tobacco Use: Medium Risk (10/30/2022)    Readmission Risk Interventions    07/24/2022    2:49 PM  Readmission Risk Prevention Plan  Transportation Screening Complete  PCP or Specialist Appt within 5-7 Days Complete  Home Care Screening Complete  Medication Review (RN CM) Complete

## 2022-11-08 NOTE — Progress Notes (Signed)
PROGRESS NOTE    DEIDRE GREGOR  XBM:[id number] DOB: Oct 14, 1930 DOA: 10/28/2022 PCP: [name], MD   Brief Narrative:  89 y.o. female with past medical history significant for chronic respiratory failure/COPD on 4 L nasal cannula at baseline, chronic diastolic congestive heart failure, HTN, HLD, anemia of chronic medical disease presented with right hip and knee pain following a mechanical fall.  She was found to have acute displaced right intertrochanteric femoral fracture.  CT head/C-spine showed no acute intracranial abnormality, stable heterogeneous enlargement of right thyroid that is most likely indicative of multinodular goiter.  She underwent ORIF by orthopedics on 10/30/2022.  Subsequently, PT recommended SNF placement.  Hospital course complicated by ileus.  NG tube placed on 11/06/2022.  GI consulted.  Assessment & Plan:   Displaced right intertrochanteric femur fracture after a mechanical fall -underwent ORIF by orthopedics on 10/30/2022.  -Subsequently, PT recommended SNF placement.  -Wound care/pain management/activity/DVT prophylaxis as per orthopedics recommendations.  Outpatient follow-up with orthopedics  Postoperative ileus -Did not improve with conservative management. -Abdominal x-ray 10/26 with gaseous dilation loops of large and small bowel consistent with ileus. CT abdomen/pelvis with contrast with moderate colonic distention without evidence of high-grade bowel obstruction, diverticulosis without acute diverticulitis, stable indeterminate lesions pancreas and left adrenal gland likely benign given long-term stability  -NG tube placed on 11/06/2022.  GI following.  Had 2 soft bowel movement yesterday and passing flatus.  Follow abdominal x-ray this morning.  Acute renal failure -Creatinine improving to 1.53 on 11/07/2022.  On IV fluids.  Monitor labs.  Hyponatremia -Possibly from poor oral intake.  Sodium improving to 129 on 11/07/2022.  Labs pending today.   Monitor.  Continue IV fluids  Leukocytosis -Monitor  Chronic respiratory failure with hypoxia/COPD -On 4 L oxygen via nasal cannula normally.  Currently on the same.  Continue Dulera, Singulair and Incruse Ellipta.  Outpatient follow-up with pulmonary  Chronic diastolic heart failure Essential hypertension -Currently compensated.  Strict input and output.  Daily weights.  Amlodipine, metoprolol and losartan on hold.  Blood pressure on the lower side.  Hyperlipidemia -Hold statin for now  History of Crohn's disease -Hold mesalamine till oral intake resumes.  GI following  Anemia of chronic disease -From chronic illnesses.  Hemoglobin stable.  Monitor intermittently.  Hypoalbuminemia -From poor oral intake.  Goals of care -Patient is DNR.  Overall prognosis is guarded.  Palliative care following.  DVT prophylaxis: Lovenox Code Status: DNR Family Communication: None at bedside Disposition Plan: Status is: Inpatient Remains inpatient appropriate because: Of severity of illness    Consultants: Orthopedic/GI.   palliative care  Procedures: As above  Antimicrobials: Perioperative   Subjective: Patient seen and examined at bedside.  Had 2 small bowel movements yesterday and he is passing flatus.  Denies worsening abdominal pain, fever or vomiting. Objective: Vitals:   11/07/22 2002 11/08/22 0604 11/08/22 0730 11/08/22 0811  BP: (!) 96/54 100/73  95/64  Pulse: 84   90  Resp:  16  15  Temp: (!) 97.5 F (36.4 C) 97.8 F (36.6 C)  (!) 97.5 F (36.4 C)  TempSrc: Oral Oral  Oral  SpO2: 93% 94% 93% 97%  Weight:      Height:        Intake/Output Summary (Last 24 hours) at 11/08/2022 0841 Last data filed at 11/08/2022 0600 Gross per 24 hour  Intake 720 ml  Output 750 ml  Net -30 ml   Filed Weights   11/04/22 0500 11/06/22 0500 11/07/22 0500  Weight: 94.7 kg 97.4 kg 94.3 kg    Examination:  General: On 4 L oxygen via nasal cannula.  No distress.  Looks  chronically ill and deconditioned. ENT/neck: No thyromegaly.  JVD is not elevated.  NG tube is present respiratory: Decreased breath sounds at bases bilaterally with some crackles; no wheezing  CVS: S1-S2 heard, rate controlled currently Abdominal: Soft, mildly tender, slightly distended; no organomegaly, normal bowel sounds are still sluggish Extremities: Trace lower extremity edema; no cyanosis  CNS: Awake and alert.  Still slow to respond and a poor historian.  No focal neurologic deficit.  Moves extremities Lymph: No obvious lymphadenopathy Skin: No obvious ecchymosis/lesions  psych: Affect is mostly flat.  Not agitated currently.   Musculoskeletal: No obvious joint swelling/deformity     Data Reviewed: I have personally reviewed following labs and imaging studies  CBC: Recent Labs  Lab 11/02/22 0744 11/04/22 0751 11/05/22 0830 11/06/22 0830 11/07/22 0854  WBC 13.2* 12.9* 10.1 11.0* 13.1*  HGB 8.8* 9.4* 8.5* 9.4* 9.4*  HCT 27.0* 28.7* 26.1* 29.1* 29.8*  MCV 87.9 88.3 88.2 89.3 89.8  PLT 179 249 253 346 353   Basic Metabolic Panel: Recent Labs  Lab 11/02/22 0744 11/04/22 0751 11/05/22 0830 11/06/22 0830 11/07/22 0854  NA 129* 126* 125* 125* 129*  K 3.9 4.7 4.7 4.8 4.9  CL 94* 91* 95* 92* 96*  CO2 24 25 21* 20* 24  GLUCOSE 149* 100* 100* 139* 91  BUN 50* 52* 56* 59* 57*  CREATININE 1.49* 1.63* 1.58* 1.61* 1.53*  CALCIUM 7.5* 7.8* 7.2* 7.7* 7.8*  MG 2.1 2.3 2.3 2.4 2.4   GFR: Estimated Creatinine Clearance: 28.3 mL/min (A) (by C-G formula based on SCr of 1.53 mg/dL (H)). Liver Function Tests: Recent Labs  Lab 11/07/22 0854  AST 23  ALT 13  ALKPHOS 80  BILITOT 1.2  PROT 5.5*  ALBUMIN 1.9*   No results for input(s): "LIPASE", "AMYLASE" in the last 168 hours. No results for input(s): "AMMONIA" in the last 168 hours. Coagulation Profile: No results for input(s): "INR", "PROTIME" in the last 168 hours. Cardiac Enzymes: No results for input(s): "CKTOTAL",  "CKMB", "CKMBINDEX", "TROPONINI" in the last 168 hours. BNP (last 3 results) No results for input(s): "PROBNP" in the last 8760 hours. HbA1C: No results for input(s): "HGBA1C" in the last 72 hours. CBG: No results for input(s): "GLUCAP" in the last 168 hours. Lipid Profile: No results for input(s): "CHOL", "HDL", "LDLCALC", "TRIG", "CHOLHDL", "LDLDIRECT" in the last 72 hours. Thyroid Function Tests: No results for input(s): "TSH", "T4TOTAL", "FREET4", "T3FREE", "THYROIDAB" in the last 72 hours. Anemia Panel: No results for input(s): "VITAMINB12", "FOLATE", "FERRITIN", "TIBC", "IRON", "RETICCTPCT" in the last 72 hours. Sepsis Labs: Recent Labs  Lab 11/02/22 0743  PROCALCITON 0.31    Recent Results (from the past 240 hour(s))  Surgical pcr screen     Status: None   Collection Time: 10/30/22 12:42 PM   Specimen: Nasal Mucosa; Nasal Swab  Result Value Ref Range Status   MRSA, PCR NEGATIVE NEGATIVE Final   Staphylococcus aureus NEGATIVE NEGATIVE Final    Comment: (NOTE) The Xpert SA Assay (FDA approved for NASAL specimens in patients 48 years of age and older), is one component of a comprehensive surveillance program. It is not intended to diagnose infection nor to guide or monitor treatment. Performed at [facility], [address], [place], [place] [address]          Radiology Studies: DG Abd Portable 1V-Small Bowel  Obstruction Protocol-initial, 8 hr delay  Result Date: 11/07/2022 CLINICAL DATA:  8 hour small-bowel protocol EXAM: PORTABLE ABDOMEN - 1 VIEW COMPARISON:  11/06/2022 FINDINGS: Persistent gaseous dilatation of the bowel without significant change. Enteral contrast is present in the colon and rectum. Esophageal tube tip in the left upper quadrant IMPRESSION: Persistent gaseous dilatation of the bowel without significant change. Enteral contrast is present in the colon and rectum. Electronically Signed   By: Jasmine Pang M.D.   On: 11/07/2022 04:18   DG  Abd Portable 1V-Small Bowel Protocol-Position Verification  Result Date: 11/06/2022 CLINICAL DATA:  NG tube placement. EXAM: PORTABLE ABDOMEN - 1 VIEW COMPARISON:  Radiograph yesterday, CT 11/03/2022 FINDINGS: Tip and side port of the enteric tube below the diaphragm in the stomach. Gaseous bowel distention is similar to yesterday's exam. IMPRESSION: Tip and side port of the enteric tube below the diaphragm in the stomach. Electronically Signed   By: Narda Rutherford M.D.   On: 11/06/2022 14:38        Scheduled Meds:  enoxaparin (LOVENOX) injection  30 mg Subcutaneous Q24H   fluticasone  2 spray Each Nare Daily   lidocaine  1 patch Transdermal Q24H   mesalamine  1,000 mg Oral BID   mometasone-formoterol  2 puff Inhalation BID   montelukast  10 mg Oral QPM   pantoprazole (PROTONIX) IV  40 mg Intravenous Q24H   senna-docusate  1 tablet Oral BID   umeclidinium bromide  1 puff Inhalation Daily   Continuous Infusions:  dextrose 5 % and 0.9 % NaCl 75 mL/hr at 11/08/22 0247          Glade Lloyd, MD [facility] 11/08/2022, 8:41 AM

## 2022-11-08 NOTE — Progress Notes (Signed)
Patient ID: Felicia Benson, female   DOB: Jan 19, 1930, 89 y.o.   MRN: [id number]    Progress Note from the Palliative Medicine Team at [facility]   Patient Name: Felicia Benson        Date: 11/08/2022 DOB: Nov 13, 1930  Age: 89 y.o. MRN#: [id number] Attending Physician: Glade Lloyd, MD Primary Care Physician: [name], MD Admit Date: 10/28/2022   Reason for Consultation/Follow-up   Establishing Goals of Care   HPI/ Brief Hospital Review  89 y.o. female   admitted on 10/28/2022 with   past medical history significant for chronic respiratory failure/COPD on 4 L nasal cannula at baseline, chronic diastolic congestive heart failure, HTN, HLD, anemia of chronic medical disease presented with right hip and knee pain following a mechanical fall.     She was found to have acute displaced right intertrochanteric femoral fracture.     CT head/C-spine showed no acute intracranial abnormality, stable heterogeneous enlargement of right thyroid that is most likely indicative of multinodular goiter.     She underwent ORIF by orthopedics on 10/30/2022.  Subsequently, PT recommended SNF placement.     Hospital course complicated by ileus.  NG tube placed on 11/06/2022.  GI consulted. Patient having bowel movements, ileus seems to be resolving    Patient and family face treatment option decisions, advanced directive decisions and anticipatory care needs.   Subjective  Extensive chart review has been completed prior to meeting with patient/family  including labs, vital signs, imaging, progress/consult notes, orders, medications and available advance directive documents.    This NP assessed patient at the bedside as a follow up to  yesterday's GOCs meeting.     Patient is alert and "feeling better", she is having bowel movements and passing gas. Hoping fully patient will continue to improve, remove NG tube and start clear liquids.  She is very optimistic and hopeful for ongoing  improvements  When medically stable, plan is for SNF for short term rehab.  Education offered and patient encouraged to increase her function and work hard with PT/OT therapies. Discussed the importance on her overall wellness and recovery.       I spoke to daughter by telephone and discussed above.  She understands. We again discussed securing HPOA, booklet is complete at bedside.  I spoke to Spiritual Care and they will move forward with documentation..  Education offered today to Ms. Mellen regarding  the importance of continued conversation with her  family and the medical providers regarding overall plan of care and treatment options,  ensuring decisions are within the context of the patients values and GOCs.  Questions and concerns addressed   Discussed with primary team and nursing staff   Time:  50   minutes  Detailed review of medical records ( labs, imaging, vital signs), medically appropriate exam ( MS, skin, cardiac,  resp)   discussed with treatment team, counseling and education to patient, family, staff, documenting clinical information, medication management, coordination of care    Lorinda Creed NP  Palliative Medicine Team Team Phone # [phone] Pager [phone]

## 2022-11-09 ENCOUNTER — Inpatient Hospital Stay (HOSPITAL_COMMUNITY): Payer: Medicare Other

## 2022-11-09 DIAGNOSIS — S72001D Fracture of unspecified part of neck of right femur, subsequent encounter for closed fracture with routine healing: Secondary | ICD-10-CM | POA: Diagnosis not present

## 2022-11-09 LAB — CBC WITH DIFFERENTIAL/PLATELET
Abs Immature Granulocytes: 0.16 10*3/uL — ABNORMAL HIGH (ref 0.00–0.07)
Basophils Absolute: 0 10*3/uL (ref 0.0–0.1)
Basophils Relative: 0 %
Eosinophils Absolute: 0.1 10*3/uL (ref 0.0–0.5)
Eosinophils Relative: 1 %
HCT: 29 % — ABNORMAL LOW (ref 36.0–46.0)
Hemoglobin: 8.9 g/dL — ABNORMAL LOW (ref 12.0–15.0)
Immature Granulocytes: 1 %
Lymphocytes Relative: 8 %
Lymphs Abs: 0.9 10*3/uL (ref 0.7–4.0)
MCH: 28 pg (ref 26.0–34.0)
MCHC: 30.7 g/dL (ref 30.0–36.0)
MCV: 91.2 fL (ref 80.0–100.0)
Monocytes Absolute: 1.2 10*3/uL — ABNORMAL HIGH (ref 0.1–1.0)
Monocytes Relative: 10 %
Neutro Abs: 9.3 10*3/uL — ABNORMAL HIGH (ref 1.7–7.7)
Neutrophils Relative %: 80 %
Platelets: 345 10*3/uL (ref 150–400)
RBC: 3.18 MIL/uL — ABNORMAL LOW (ref 3.87–5.11)
RDW: 17.1 % — ABNORMAL HIGH (ref 11.5–15.5)
WBC: 11.7 10*3/uL — ABNORMAL HIGH (ref 4.0–10.5)
nRBC: 0.3 % — ABNORMAL HIGH (ref 0.0–0.2)

## 2022-11-09 LAB — BASIC METABOLIC PANEL
Anion gap: 12 (ref 5–15)
BUN: 57 mg/dL — ABNORMAL HIGH (ref 8–23)
CO2: 21 mmol/L — ABNORMAL LOW (ref 22–32)
Calcium: 7.7 mg/dL — ABNORMAL LOW (ref 8.9–10.3)
Chloride: 98 mmol/L (ref 98–111)
Creatinine, Ser: 1.48 mg/dL — ABNORMAL HIGH (ref 0.44–1.00)
GFR, Estimated: 33 mL/min — ABNORMAL LOW (ref >60)
Glucose, Bld: 87 mg/dL (ref 70–99)
Potassium: 5.2 mmol/L — ABNORMAL HIGH (ref 3.5–5.1)
Sodium: 131 mmol/L — ABNORMAL LOW (ref 135–145)

## 2022-11-09 LAB — MAGNESIUM: Magnesium: 2.4 mg/dL (ref 1.7–2.4)

## 2022-11-09 MED ORDER — OXYCODONE HCL 5 MG PO TABS
5.0000 mg | ORAL_TABLET | ORAL | Status: DC | PRN
  Administered 2022-11-09 – 2022-11-12 (×6): 10 mg via ORAL
  Administered 2022-11-12: 5 mg via ORAL
  Administered 2022-11-13: 10 mg via ORAL
  Filled 2022-11-09 (×5): qty 2
  Filled 2022-11-09: qty 1
  Filled 2022-11-09 (×2): qty 2

## 2022-11-09 MED ORDER — FUROSEMIDE 10 MG/ML IJ SOLN
20.0000 mg | Freq: Once | INTRAMUSCULAR | Status: AC
  Administered 2022-11-09: 20 mg via INTRAVENOUS
  Filled 2022-11-09: qty 2

## 2022-11-09 MED ORDER — LIDOCAINE 5 % EX PTCH
2.0000 | MEDICATED_PATCH | CUTANEOUS | Status: DC
  Administered 2022-11-09 – 2022-11-14 (×6): 2 via TRANSDERMAL
  Filled 2022-11-09 (×7): qty 2

## 2022-11-09 MED ORDER — PHENOL 1.4 % MT LIQD
1.0000 | OROMUCOSAL | Status: DC | PRN
  Administered 2022-11-09: 1 via OROMUCOSAL
  Filled 2022-11-09: qty 177

## 2022-11-09 MED ORDER — DEXTROSE-SODIUM CHLORIDE 5-0.9 % IV SOLN: INTRAVENOUS | Status: DC

## 2022-11-09 MED ORDER — LIDOCAINE 5 % EX PTCH: 2.0000 | MEDICATED_PATCH | CUTANEOUS | Status: DC

## 2022-11-09 NOTE — Progress Notes (Signed)
[facility] Gastroenterology Progress Note  Felicia Benson 89 y.o. [birth year]  CC: Postoperative ileus   Subjective: Patient seen and examined at bedside.  She continues to have intermittent NG suction.  She is passing gas.  Had a small bowel movement this morning.  ROS : Afebrile, negative for chest pain   Objective: Vital signs in last 24 hours: Vitals:   11/08/22 1955 11/09/22 0829  BP:    Pulse:  88  Resp:  17  Temp:    SpO2: 94% 96%    Physical Exam: Elderly patient, well-developed, not in acute distress.  NG tube noted.  Abdomen is soft with hypoactive bowel sounds, no peritoneal signs.  Lab Results: Recent Labs    11/08/22 0942 11/09/22 0732  NA 129* 131*  K 4.6 5.2*  CL 98 98  CO2 22 21*  GLUCOSE 131* 87  BUN 56* 57*  CREATININE 1.37* 1.48*  CALCIUM 7.6* 7.7*  MG 2.4 2.4   Recent Labs    11/07/22 0854  AST 23  ALT 13  ALKPHOS 80  BILITOT 1.2  PROT 5.5*  ALBUMIN 1.9*   Recent Labs    11/08/22 0942 11/09/22 0732  WBC 11.7* 11.7*  NEUTROABS 9.3* 9.3*  HGB 9.5* 8.9*  HCT 30.6* 29.0*  MCV 90.8 91.2  PLT 363 345   No results for input(s): "LABPROT", "INR" in the last 72 hours.    Assessment/Plan: -Postoperative ileus.  Resolving.  She is passing gas.  Having small bowel movements. -Recent hip fracture with ORIF on October 30, 2022. -History of Crohn's disease  Recommendations -------------------------- -Repeat abdominal x-ray report pending.  Okay to remove NG suction if x-ray shows improvement in ileus.  Okay to start Pentasa when able to take oral medications.  Recommend physical therapy when possible.  No further inpatient GI workup planned.  GI will sign off.  Call us back if needed.   Kathi Der MD, FACP 11/09/2022, 10:31 AM  Contact #  [phone]

## 2022-11-09 NOTE — Progress Notes (Signed)
PROGRESS NOTE    PRISCILA BEAN  OZH:[id number] DOB: Sep 08, 1930 DOA: 10/28/2022 PCP: [name], MD   Brief Narrative:  89 y.o. female with past medical history significant for chronic respiratory failure/COPD on 4 L nasal cannula at baseline, chronic diastolic congestive heart failure, HTN, HLD, anemia of chronic medical disease presented with right hip and knee pain following a mechanical fall.  She was found to have acute displaced right intertrochanteric femoral fracture.  CT head/C-spine showed no acute intracranial abnormality, stable heterogeneous enlargement of right thyroid that is most likely indicative of multinodular goiter.  She underwent ORIF by orthopedics on 10/30/2022.  Subsequently, PT recommended SNF placement.  Hospital course complicated by ileus.  NG tube placed on 11/06/2022.  GI consulted.  Assessment & Plan:   Displaced right intertrochanteric femur fracture after a mechanical fall -underwent ORIF by orthopedics on 10/30/2022.  -Subsequently, PT recommended SNF placement.  -Wound care/pain management/activity/DVT prophylaxis as per orthopedics recommendations.  Outpatient follow-up with orthopedics  Postoperative ileus -Currently has NG tube. Follow abdominal x-ray this morning.  GI following.  Acute renal failure -Creatinine improving to 1.37 on 11/08/2022.  She looks volume overloaded this morning.  Will give 1 dose of IV Lasix.  Monitor labs.  Hyponatremia -Possibly from poor oral intake.  Sodium improving to 129 on 11/08/2022.  Labs pending today.  Monitor.  Continue IV fluids  Leukocytosis -Improving.  Labs pending today.  Chronic respiratory failure with hypoxia/COPD -On 4 L oxygen via nasal cannula normally.  Currently on the same.  Continue Dulera, Singulair and Incruse Ellipta.  Outpatient follow-up with pulmonary  Chronic diastolic heart failure Essential hypertension -Currently compensated.  Strict input and output.  Daily weights.   Amlodipine, metoprolol and losartan on hold.  Blood pressure on the lower side.  Hyperlipidemia -Hold statin for now  History of Crohn's disease -Hold mesalamine till oral intake resumes.  GI following  Anemia of chronic disease -From chronic illnesses.  Hemoglobin stable.  Monitor intermittently.  Hypoalbuminemia -From poor oral intake.  Goals of care -Patient is DNR.  Overall prognosis is guarded.  Palliative care following.  DVT prophylaxis: Lovenox Code Status: DNR Family Communication: None at bedside Disposition Plan: Status is: Inpatient Remains inpatient appropriate because: Of severity of illness    Consultants: Orthopedic/GI.   palliative care  Procedures: As above  Antimicrobials: Perioperative   Subjective: Patient seen and examined at bedside.  States that she had bowel movement yesterday as well.  Still passing flatus.  Complains of intermittent abdominal and hip pain.  Denies shortness of breath or chest pain.   Objective: Vitals:   11/08/22 1836 11/08/22 1952 11/08/22 1955 11/09/22 0500  BP: (!) 91/51 94/61    Pulse: 86 96    Resp:  18    Temp:  98.2 F (36.8 C)    TempSrc:  Oral    SpO2: 90% 94% 94%   Weight:    94.3 kg  Height:        Intake/Output Summary (Last 24 hours) at 11/09/2022 0744 Last data filed at 11/08/2022 1344 Gross per 24 hour  Intake 1775.92 ml  Output --  Net 1775.92 ml   Filed Weights   11/06/22 0500 11/07/22 0500 11/09/22 0500  Weight: 97.4 kg 94.3 kg 94.3 kg    Examination:  General: No acute distress.  Currently on 4 L oxygen.  Looks chronically ill and deconditioned. ENT/neck: No obvious neck masses or elevated JVD noted.  NG tube is still present respiratory: Bilateral  decreased breath sounds at bases with scattered crackles CVS: Rate mostly controlled; S1 and S2 are heard Abdominal: Soft, slightly tender and distended; no organomegaly, sluggish bowel sounds  extremities: No clubbing; mild lower extremity  edema present CNS: Awake; still very slow to respond and a poor historian.  No focal neurologic deficit.  Able to move extremities Lymph: No palpable lymphadenopathy Skin: No obvious petechia/lesions  psych: Currently not agitated.  Extremely flat affect. Musculoskeletal: No obvious joint erythema/tenderness     Data Reviewed: I have personally reviewed following labs and imaging studies  CBC: Recent Labs  Lab 11/04/22 0751 11/05/22 0830 11/06/22 0830 11/07/22 0854 11/08/22 0942  WBC 12.9* 10.1 11.0* 13.1* 11.7*  NEUTROABS  --   --   --   --  9.3*  HGB 9.4* 8.5* 9.4* 9.4* 9.5*  HCT 28.7* 26.1* 29.1* 29.8* 30.6*  MCV 88.3 88.2 89.3 89.8 90.8  PLT 249 253 346 353 363   Basic Metabolic Panel: Recent Labs  Lab 11/04/22 0751 11/05/22 0830 11/06/22 0830 11/07/22 0854 11/08/22 0942  NA 126* 125* 125* 129* 129*  K 4.7 4.7 4.8 4.9 4.6  CL 91* 95* 92* 96* 98  CO2 25 21* 20* 24 22  GLUCOSE 100* 100* 139* 91 131*  BUN 52* 56* 59* 57* 56*  CREATININE 1.63* 1.58* 1.61* 1.53* 1.37*  CALCIUM 7.8* 7.2* 7.7* 7.8* 7.6*  MG 2.3 2.3 2.4 2.4 2.4   GFR: Estimated Creatinine Clearance: 31.6 mL/min (A) (by C-G formula based on SCr of 1.37 mg/dL (H)). Liver Function Tests: Recent Labs  Lab 11/07/22 0854  AST 23  ALT 13  ALKPHOS 80  BILITOT 1.2  PROT 5.5*  ALBUMIN 1.9*   No results for input(s): "LIPASE", "AMYLASE" in the last 168 hours. No results for input(s): "AMMONIA" in the last 168 hours. Coagulation Profile: No results for input(s): "INR", "PROTIME" in the last 168 hours. Cardiac Enzymes: No results for input(s): "CKTOTAL", "CKMB", "CKMBINDEX", "TROPONINI" in the last 168 hours. BNP (last 3 results) No results for input(s): "PROBNP" in the last 8760 hours. HbA1C: No results for input(s): "HGBA1C" in the last 72 hours. CBG: No results for input(s): "GLUCAP" in the last 168 hours. Lipid Profile: No results for input(s): "CHOL", "HDL", "LDLCALC", "TRIG", "CHOLHDL",  "LDLDIRECT" in the last 72 hours. Thyroid Function Tests: No results for input(s): "TSH", "T4TOTAL", "FREET4", "T3FREE", "THYROIDAB" in the last 72 hours. Anemia Panel: No results for input(s): "VITAMINB12", "FOLATE", "FERRITIN", "TIBC", "IRON", "RETICCTPCT" in the last 72 hours. Sepsis Labs: No results for input(s): "PROCALCITON", "LATICACIDVEN" in the last 168 hours.   Recent Results (from the past 240 hour(s))  Surgical pcr screen     Status: None   Collection Time: 10/30/22 12:42 PM   Specimen: Nasal Mucosa; Nasal Swab  Result Value Ref Range Status   MRSA, PCR NEGATIVE NEGATIVE Final   Staphylococcus aureus NEGATIVE NEGATIVE Final    Comment: (NOTE) The Xpert SA Assay (FDA approved for NASAL specimens in patients 58 years of age and older), is one component of a comprehensive surveillance program. It is not intended to diagnose infection nor to guide or monitor treatment. Performed at [facility], [address], [place], [place] [address]          Radiology Studies: DG Abd Portable 1V  Result Date: 11/08/2022 CLINICAL DATA:  89 year old female with recent hip surgery, abdominal pain. CT suggestive of postoperative ileus. EXAM: PORTABLE ABDOMEN - 1 VIEW COMPARISON:  Abdominal radiographs yesterday, CT Abdomen and  Pelvis 11/03/2022. FINDINGS: Portable AP supine view at 0641 hours. Enteric tube remains in place but has been pulled back, side hole now projects just above the central diaphragm likely just upstream of the GEJ. Lung bases appear stable. Cardiac pacemaker leads. Bowel-gas pattern not significantly changed since 11/03/2022. Oral contrast at that time has cleared the small bowel, present in the right colon and the rectum, with mild additional clearance since yesterday. Large bowel diverticulosis again noted. Aortoiliac calcified atherosclerosis. Stable visualized osseous structures. Right femur ORIF. IMPRESSION: 1. Enteric tube has been pulled back, side hole now  projects proximal to the GEJ. Recommend advancing the tube 6 cm to ensure side hole placement within the stomach. 2. Bowel-gas pattern not significantly changed since 11/03/2022. But transit of oral contrast from the small to the large bowel since 11/03/2022, and mild large bowel clearance of contrast since yesterday. Electronically Signed   By: Odessa Fleming M.D.   On: 11/08/2022 11:28        Scheduled Meds:  enoxaparin (LOVENOX) injection  30 mg Subcutaneous Q24H   fluticasone  2 spray Each Nare Daily   lidocaine  1 patch Transdermal Q24H   mesalamine  1,000 mg Oral BID   mometasone-formoterol  2 puff Inhalation BID   montelukast  10 mg Oral QPM   pantoprazole (PROTONIX) IV  40 mg Intravenous Q24H   senna-docusate  1 tablet Oral BID   umeclidinium bromide  1 puff Inhalation Daily   Continuous Infusions:          Glade Lloyd, MD [facility] 11/09/2022, 7:44 AM

## 2022-11-09 NOTE — Progress Notes (Signed)
Physical Therapy Treatment Patient Details Name: Felicia Benson MRN: [id number] DOB: 1930/01/22 Today's Date: 11/09/2022   History of Present Illness Pt is a 89 y/o female presenting on 10/20 after fall.  Found with R hip fx, s/p IM nail 10/22.  PMH includes: chronic hypoxic respiratory failure on baseline and 4 L continuous nasal cannula, COPD, chronic diastolic heart failure, essential pretension, hyperlipidemia anemia of chronic disease is a baseline hemoglobin range 10-12.    PT Comments  Patient reports feeling poorly today and declined getting out of bed. She did request to be repositioned and was agreeable to in bed exercise for strengthening. +2 person assistance required for bed level mobility. No reported increased pain with LE exercises and patient reports increased comfort after being repositioned in the bed. PT will continue to follow. Recommend rehabilitation after this hospital stay <3 hours/day.    If plan is discharge home, recommend the following: Two people to help with walking and/or transfers;Assist for transportation;Two people to help with bathing/dressing/bathroom   Can travel by private vehicle     No  Equipment Recommendations  Wheelchair (measurements PT);Wheelchair cushion (measurements PT);Hospital bed;Hoyer lift    Recommendations for Other Services       Precautions / Restrictions Precautions Precautions: Fall;Other (comment) Precaution Comments: BPs soft, oxygen 4L at baseline Restrictions Weight Bearing Restrictions: Yes RLE Weight Bearing: Weight bearing as tolerated     Mobility  Bed Mobility Overal bed mobility: Needs Assistance             General bed mobility comments: +2 person assistance required for repositioning up in bed and rolling towards the right side. patient declined getting out of bed today due to feeling poorly but reports feeling much better after repositioning in the bed    Transfers                         Ambulation/Gait                   Stairs             Wheelchair Mobility     Tilt Bed    Modified Rankin (Stroke Patients Only)       Balance                                            Cognition Arousal: Alert Behavior During Therapy: WFL for tasks assessed/performed Overall Cognitive Status: Impaired/Different from baseline Area of Impairment: Following commands, Problem solving                       Following Commands: Follows multi-step commands inconsistently     Problem Solving: Slow processing, Difficulty sequencing, Requires verbal cues, Requires tactile cues          Exercises General Exercises - Lower Extremity Ankle Circles/Pumps: AROM, Strengthening, Both, 10 reps, Supine Heel Slides: AAROM, Strengthening, Both, 10 reps, Supine Hip ABduction/ADduction: AAROM, Strengthening, Both, 10 reps, Supine Other Exercises Other Exercises: cues for exercise technique with no increased pain reported    General Comments        Pertinent Vitals/Pain Pain Assessment Pain Assessment: Faces Faces Pain Scale: Hurts little more Pain Location: bilateral hips R>L Pain Descriptors / Indicators: Discomfort Pain Intervention(s): Repositioned    Home Living  Prior Function            PT Goals (current goals can now be found in the care plan section) Acute Rehab PT Goals Patient Stated Goal: pain control, to walk PT Goal Formulation: With patient/family Time For Goal Achievement: 11/14/22 Potential to Achieve Goals: Fair Progress towards PT goals: Progressing toward goals    Frequency    Min 1X/week      PT Plan      Co-evaluation              AM-PAC PT "6 Clicks" Mobility   Outcome Measure  Help needed turning from your back to your side while in a flat bed without using bedrails?: A Lot Help needed moving from lying on your back to sitting on the side of a flat  bed without using bedrails?: Total Help needed moving to and from a bed to a chair (including a wheelchair)?: Total Help needed standing up from a chair using your arms (e.g., wheelchair or bedside chair)?: Total Help needed to walk in hospital room?: Total Help needed climbing 3-5 steps with a railing? : Total 6 Click Score: 7    End of Session   Activity Tolerance: Patient tolerated treatment well Patient left: in bed;with call bell/phone within reach;with bed alarm set   PT Visit Diagnosis: Unsteadiness on feet (R26.81);History of falling (Z91.81);Muscle weakness (generalized) (M62.81);Pain     Time: 1410-1425 PT Time Calculation (min) (ACUTE ONLY): 15 min  Charges:    $Therapeutic Exercise: 8-22 mins PT General Charges $$ ACUTE PT VISIT: 1 Visit                     Donna Bernard, PT, MPT    Ina Homes 11/09/2022, 2:57 PM

## 2022-11-09 NOTE — Progress Notes (Signed)
This chaplain responded to PMT NP-Mary's consult for creating an Advance Directive. The Pt. is creating HCPOA only after AD education and answering clarifying questions. The Pt. is not creating a Living Will.  The Pt. chooses [name] as her healthcare agent. If this person is unable or unwilling to serve in this role the Pt. next choice is Primary school teacher.  The chaplain is present with the Pt., notary, and witnesses for the notarizing of the Pt. Advance Directive.  The chaplain gave the Pt. the original AD along with two copies.  The chaplain scanned the Pt. AD into the Pt. EMR.  The chaplain is available for F/U spiritual care as needed.  Chaplain Stephanie Acre [phone]

## 2022-11-09 NOTE — Progress Notes (Signed)
    10 Days Post-Op Procedure(s) (LRB): INTRAMEDULLARY nailing of right femur (Right)  Subjective: Patient reports pain as mild.  Lying comfortably in bed this morning. Worked with PT 2 days ago, still difficulty with mobilization, still unable to ambulate. Stood with max assist. Eager to do more with therapy.  Urinated on bed, nurse notified.  Anticipate SNF placement.  Objective:   VITALS:   Vitals:   11/08/22 1836 11/08/22 1952 11/08/22 1955 11/09/22 0500  BP: (!) 91/51 94/61    Pulse: 86 96    Resp:  18    Temp:  98.2 F (36.8 C)    TempSrc:  Oral    SpO2: 90% 94% 94%   Weight:    94.3 kg  Height:        Laying comfortably in bed, AAOx4 Sensation intact distally Intact pulses distally Dorsiflexion/Plantar flexion intact Compartment soft Bandages not intact, incision clean dry and without drainage -- bandages changed Mild bruising around right knee, nontender    Lab Results  Component Value Date   WBC 11.7 (H) 11/08/2022   HGB 9.5 (L) 11/08/2022   HCT 30.6 (L) 11/08/2022   MCV 90.8 11/08/2022   PLT 363 11/08/2022   BMET    Component Value Date/Time   NA 129 (L) 11/08/2022 0942   K 4.6 11/08/2022 0942   CL 98 11/08/2022 0942   CO2 22 11/08/2022 0942   GLUCOSE 131 (H) 11/08/2022 0942   BUN 56 (H) 11/08/2022 0942   CREATININE 1.37 (H) 11/08/2022 0942   CALCIUM 7.6 (L) 11/08/2022 0942   GFRNONAA 37 (L) 11/08/2022 0942     Xray: well reduced intertroch fx, hardware in place without adverse features  Assessment/Plan: 10 Days Post-Op   Principal Problem:   Closed right hip fracture (HCC) Active Problems:   Essential hypertension   Chronic hypoxic respiratory failure (HCC)   Hyperlipidemia   Chronic diastolic CHF (congestive heart failure) (HCC)   Fall at home, initial encounter   History of COPD   Allergic rhinitis   Anemia of chronic disease   Ileus, postoperative (HCC)  S/p right Intertroch femur fracture ORIF 10/30/22  Narrative:  11/1:  Continue mobilizing with PT.  Advance diet per GI.  Nurse notified of urine situation.  Bandages changed.  Plan to reassess Monday.  Post op recs: WB: WBAT RLE Abx: ancef x23 hours post op Imaging: PACU xrays Dressing: keep intact until follow up, change PRN if soiled or saturated. DVT prophylaxis: lovenox starting POD1 x4 weeks Follow up: 2 weeks after surgery for a wound check with Dr. Blanchie Dessert at [facility].  Address: [address], [place], [place] [address]  Office Phone: [phone]   Cecil Cobbs 11/09/2022, 7:18 AM   Contact information:   Weekdays 7am-5pm epic message Dr. Blanchie Dessert, or call office for patient follow up: [phone] After hours and holidays please check [email or web address] for group call information for [facility]

## 2022-11-10 ENCOUNTER — Inpatient Hospital Stay (HOSPITAL_COMMUNITY): Payer: Medicare Other

## 2022-11-10 DIAGNOSIS — S72001D Fracture of unspecified part of neck of right femur, subsequent encounter for closed fracture with routine healing: Secondary | ICD-10-CM | POA: Diagnosis not present

## 2022-11-10 LAB — CBC WITH DIFFERENTIAL/PLATELET
Abs Immature Granulocytes: 0.1 10*3/uL — ABNORMAL HIGH (ref 0.00–0.07)
Basophils Absolute: 0 10*3/uL (ref 0.0–0.1)
Basophils Relative: 0 %
Eosinophils Absolute: 0.1 10*3/uL (ref 0.0–0.5)
Eosinophils Relative: 0 %
HCT: 28.6 % — ABNORMAL LOW (ref 36.0–46.0)
Hemoglobin: 8.9 g/dL — ABNORMAL LOW (ref 12.0–15.0)
Immature Granulocytes: 1 %
Lymphocytes Relative: 7 %
Lymphs Abs: 0.9 10*3/uL (ref 0.7–4.0)
MCH: 28.6 pg (ref 26.0–34.0)
MCHC: 31.1 g/dL (ref 30.0–36.0)
MCV: 92 fL (ref 80.0–100.0)
Monocytes Absolute: 1.1 10*3/uL — ABNORMAL HIGH (ref 0.1–1.0)
Monocytes Relative: 9 %
Neutro Abs: 9.8 10*3/uL — ABNORMAL HIGH (ref 1.7–7.7)
Neutrophils Relative %: 83 %
Platelets: 416 10*3/uL — ABNORMAL HIGH (ref 150–400)
RBC: 3.11 MIL/uL — ABNORMAL LOW (ref 3.87–5.11)
RDW: 17.2 % — ABNORMAL HIGH (ref 11.5–15.5)
WBC: 11.9 10*3/uL — ABNORMAL HIGH (ref 4.0–10.5)
nRBC: 0.4 % — ABNORMAL HIGH (ref 0.0–0.2)

## 2022-11-10 LAB — BASIC METABOLIC PANEL
Anion gap: 12 (ref 5–15)
BUN: 60 mg/dL — ABNORMAL HIGH (ref 8–23)
CO2: 19 mmol/L — ABNORMAL LOW (ref 22–32)
Calcium: 8.1 mg/dL — ABNORMAL LOW (ref 8.9–10.3)
Chloride: 99 mmol/L (ref 98–111)
Creatinine, Ser: 1.59 mg/dL — ABNORMAL HIGH (ref 0.44–1.00)
GFR, Estimated: 31 mL/min — ABNORMAL LOW (ref >60)
Glucose, Bld: 67 mg/dL — ABNORMAL LOW (ref 70–99)
Potassium: 5.3 mmol/L — ABNORMAL HIGH (ref 3.5–5.1)
Sodium: 130 mmol/L — ABNORMAL LOW (ref 135–145)

## 2022-11-10 LAB — MAGNESIUM: Magnesium: 2.4 mg/dL (ref 1.7–2.4)

## 2022-11-10 MED ORDER — SODIUM CHLORIDE 0.9% FLUSH: 10.0000 mL | INTRAVENOUS | Status: DC | PRN

## 2022-11-10 MED ORDER — SENNOSIDES-DOCUSATE SODIUM 8.6-50 MG PO TABS
2.0000 | ORAL_TABLET | Freq: Two times a day (BID) | ORAL | Status: DC
  Administered 2022-11-10 – 2022-11-12 (×4): 2 via ORAL
  Filled 2022-11-10 (×4): qty 2

## 2022-11-10 MED ORDER — FUROSEMIDE 10 MG/ML IJ SOLN
20.0000 mg | Freq: Once | INTRAMUSCULAR | Status: AC
  Administered 2022-11-10: 20 mg via INTRAVENOUS
  Filled 2022-11-10: qty 2

## 2022-11-10 MED ORDER — POLYETHYLENE GLYCOL 3350 17 G PO PACK
17.0000 g | PACK | Freq: Every day | ORAL | Status: DC
  Administered 2022-11-10 – 2022-11-12 (×2): 17 g via ORAL
  Filled 2022-11-10 (×2): qty 1

## 2022-11-10 NOTE — Progress Notes (Signed)
A midline has been ordered for this patient. The VAST RN has reviewed the patient's medical record including any arm restrictions, current creatinine clearance, length IV therapy is needed, and infusions needed/ordered to determine if a midline is the appropriate line for this individual patient. If there are contraindications, the physician and primary RN has been contacted by VAST RN for further discussion. Midline Education: a midline is a long peripheral IV placed in the upper arm with the tip located at or near the axilla and distal to the shoulder should not be used as a CLABSI preventative measure   it has one lumen only it can remain in place for up to 29 days  Safe for Vancomycin infusion LESS THAN 6 days Is safe for power injection if good blood return can be obtained and line flushes easily it CANNOT be used for continuous infusion of vesicants. Including TPN and chemotherapy Should NOT be placed for sole intent of obtaining labs as there is no guarantee blood can be successfully drawn from line  contraindicated in patients with thrombosis, hypercoagulability, decreased venous flow to the extremities, ESRD (without a nephrologist's approval), small vessels, allergy to polyurethane, or known/suspected presence of a device-related infection, bacteremia, or septicemia  Patient's GFR was less than 35, but MD is okay to put in the Midline. HS [employer]

## 2022-11-10 NOTE — Progress Notes (Signed)
PROGRESS NOTE    Felicia Benson  VFI:[id number] DOB: [birth year] DOA: 10/28/2022 PCP: [name], MD   Brief Narrative:  89 y.o. female with past medical history significant for chronic respiratory failure/COPD on 4 L nasal cannula at baseline, chronic diastolic congestive heart failure, HTN, HLD, anemia of chronic medical disease presented with right hip and knee pain following a mechanical fall.  She was found to have acute displaced right intertrochanteric femoral fracture.  CT head/C-spine showed no acute intracranial abnormality, stable heterogeneous enlargement of right thyroid that is most likely indicative of multinodular goiter.  She underwent ORIF by orthopedics on 10/30/2022.  Subsequently, PT recommended SNF placement.  Hospital course complicated by ileus.  NG tube placed on 11/06/2022.  GI consulted.  Assessment & Plan:   Displaced right intertrochanteric femur fracture after a mechanical fall -underwent ORIF by orthopedics on 10/30/2022.  -Subsequently, PT recommended SNF placement.  -Wound care/pain management/activity/DVT prophylaxis as per orthopedics recommendations.  Outpatient follow-up with orthopedics  Postoperative ileus -Currently has NG tube.  Abdominal x-ray from 11/09/2022 showed persistent ileus.  Follow abdominal x-ray this morning.  GI signed off on 11/09/2022.  Continue Senokot.  Acute renal failure -Creatinine worsening to 1.59 today.  She looks volume overloaded this morning.  Patient received 1 dose of IV Lasix on 11/09/2022.  Will give 1 more dose of IV Lasix today.  Monitor labs.  Hyperkalemia -Mild.  Monitor.  Hyponatremia -Possibly from poor oral intake.  Sodium 130 today. Monitor.    Leukocytosis -Mild.  Monitor.  Chronic respiratory failure with hypoxia/COPD -On 4 L oxygen via nasal cannula normally.  Currently on the same.  Continue Dulera, Singulair and Incruse Ellipta.  Outpatient follow-up with pulmonary  Chronic diastolic heart  failure Essential hypertension -Currently compensated.  Strict input and output.  Daily weights.  Amlodipine, metoprolol and losartan on hold.  Blood pressure on the lower side.  Hyperlipidemia -Hold statin for now  History of Crohn's disease -Hold mesalamine till oral intake resumes.  GI following  Anemia of chronic disease -From chronic illnesses.  Hemoglobin stable.  Monitor intermittently.  Hypoalbuminemia -From poor oral intake.  Goals of care -Patient is DNR.  Overall prognosis is guarded.  Palliative care following.  DVT prophylaxis: Lovenox Code Status: DNR Family Communication: None at bedside Disposition Plan: Status is: Inpatient Remains inpatient appropriate because: Of severity of illness    Consultants: Orthopedic/GI.   palliative care  Procedures: As above  Antimicrobials: Perioperative   Subjective: Patient seen and examined at bedside.  Still complains of intermittent abdominal and hip pain.  Complains of sore throat and wants to NG tube removed.  Had bowel movement yesterday.  No fever reported. Objective: Vitals:   11/09/22 2010 11/09/22 2109 11/10/22 0146 11/10/22 0552  BP:  105/72 116/73 101/64  Pulse:  (!) 114 (!) 116 (!) 111  Resp:  18 19 18   Temp:  97.7 F (36.5 C) (!) 97.5 F (36.4 C) 97.8 F (36.6 C)  TempSrc:  Oral  Oral  SpO2: 95% 93% 95% 97%  Weight:      Height:        Intake/Output Summary (Last 24 hours) at 11/10/2022 0741 Last data filed at 11/09/2022 1837 Gross per 24 hour  Intake --  Output 550 ml  Net -550 ml   Filed Weights   11/06/22 0500 11/07/22 0500 11/09/22 0500  Weight: 97.4 kg 94.3 kg 94.3 kg    Examination:  General: On 2 L oxygen via nasal cannula.  No distress.  Looks chronically ill and deconditioned. ENT/neck: No palpable thyromegaly or JVD elevation noted.  NG tube present. respiratory: Decreased breath sounds at bases bilaterally with some crackles  CVS: S1 and S2 heard; tachycardic  abdominal:  Soft, obese, remains distended and mildly tender; no organomegaly, bowel sounds remain sluggish  extremities: Bilateral lower extremity edema present; no cyanosis  CNS: Alert; still slow to respond and a poor historian; no focal neurologic deficit.  Moving extremities Lymph: No cervical lymphadenopathy Skin: No obvious rashes/lesions  psych: Flat affect.  Not agitated currently Musculoskeletal: No obvious joint deformity/tenderness     Data Reviewed: I have personally reviewed following labs and imaging studies  CBC: Recent Labs  Lab 11/06/22 0830 11/07/22 0854 11/08/22 0942 11/09/22 0732 11/10/22 0259  WBC 11.0* 13.1* 11.7* 11.7* 11.9*  NEUTROABS  --   --  9.3* 9.3* 9.8*  HGB 9.4* 9.4* 9.5* 8.9* 8.9*  HCT 29.1* 29.8* 30.6* 29.0* 28.6*  MCV 89.3 89.8 90.8 91.2 92.0  PLT 346 353 363 345 416*   Basic Metabolic Panel: Recent Labs  Lab 11/06/22 0830 11/07/22 0854 11/08/22 0942 11/09/22 0732 11/10/22 0259  NA 125* 129* 129* 131* 130*  K 4.8 4.9 4.6 5.2* 5.3*  CL 92* 96* 98 98 99  CO2 20* 24 22 21* 19*  GLUCOSE 139* 91 131* 87 67*  BUN 59* 57* 56* 57* 60*  CREATININE 1.61* 1.53* 1.37* 1.48* 1.59*  CALCIUM 7.7* 7.8* 7.6* 7.7* 8.1*  MG 2.4 2.4 2.4 2.4 2.4   GFR: Estimated Creatinine Clearance: 27.2 mL/min (A) (by C-G formula based on SCr of 1.59 mg/dL (H)). Liver Function Tests: Recent Labs  Lab 11/07/22 0854  AST 23  ALT 13  ALKPHOS 80  BILITOT 1.2  PROT 5.5*  ALBUMIN 1.9*   No results for input(s): "LIPASE", "AMYLASE" in the last 168 hours. No results for input(s): "AMMONIA" in the last 168 hours. Coagulation Profile: No results for input(s): "INR", "PROTIME" in the last 168 hours. Cardiac Enzymes: No results for input(s): "CKTOTAL", "CKMB", "CKMBINDEX", "TROPONINI" in the last 168 hours. BNP (last 3 results) No results for input(s): "PROBNP" in the last 8760 hours. HbA1C: No results for input(s): "HGBA1C" in the last 72 hours. CBG: No results for  input(s): "GLUCAP" in the last 168 hours. Lipid Profile: No results for input(s): "CHOL", "HDL", "LDLCALC", "TRIG", "CHOLHDL", "LDLDIRECT" in the last 72 hours. Thyroid Function Tests: No results for input(s): "TSH", "T4TOTAL", "FREET4", "T3FREE", "THYROIDAB" in the last 72 hours. Anemia Panel: No results for input(s): "VITAMINB12", "FOLATE", "FERRITIN", "TIBC", "IRON", "RETICCTPCT" in the last 72 hours. Sepsis Labs: No results for input(s): "PROCALCITON", "LATICACIDVEN" in the last 168 hours.   No results found for this or any previous visit (from the past 240 hour(s)).        Radiology Studies: DG CHEST PORT 1 VIEW  Result Date: 11/09/2022 CLINICAL DATA:  Dyspnea, productive cough. EXAM: PORTABLE CHEST 1 VIEW COMPARISON:  November 02, 2022. FINDINGS: Stable cardiomediastinal silhouette. Endotracheal tube is seen entering stomach. Left-sided pacemaker is unchanged. Stable bibasilar opacities are noted with probable small left pleural effusion. Bony thorax is unremarkable. IMPRESSION: Stable bibasilar opacities with probable small left pleural effusion. Endotracheal tube is seen entering stomach. Electronically Signed   By: Lupita Raider M.D.   On: 11/09/2022 16:12   DG Abd Portable 1V  Result Date: 11/09/2022 CLINICAL DATA:  [id number] Ileus (HCC) [id number] EXAM: PORTABLE ABDOMEN - 1 VIEW COMPARISON:  Abdominal radiograph 11/08/2022 FINDINGS: Redemonstrated diffusely dilated  loops of small bowel with enteric contrast seen of the splenic flexure, hepatic flexure, and in the rectum. No supine evidence of pneumoperitoneum. There has been interval repositioning of the enteric tube which courses retrograde and likely terminates in the distal esophagus. Recommend further evaluation with dedicated chest radiograph. IMPRESSION: 1. Interval repositioning of the enteric tube which courses retrograde and likely terminates in the distal esophagus. Recommend further evaluation with dedicated chest radiograph.  2. Redemonstrated diffusely dilated loops of small bowel, unchanged from recent prior abdominal radiograph These results will be called to the ordering clinician or representative by the Radiologist Assistant, and communication documented in the PACS or [employer]. Electronically Signed   By: Lorenza Cambridge M.D.   On: 11/09/2022 11:18        Scheduled Meds:  enoxaparin (LOVENOX) injection  30 mg Subcutaneous Q24H   fluticasone  2 spray Each Nare Daily   lidocaine  2 patch Transdermal Q24H   mesalamine  1,000 mg Oral BID   mometasone-formoterol  2 puff Inhalation BID   montelukast  10 mg Oral QPM   pantoprazole (PROTONIX) IV  40 mg Intravenous Q24H   senna-docusate  1 tablet Oral BID   umeclidinium bromide  1 puff Inhalation Daily   Continuous Infusions:          Glade Lloyd, MD [facility] 11/10/2022, 7:41 AM

## 2022-11-10 NOTE — Plan of Care (Signed)

## 2022-11-11 ENCOUNTER — Inpatient Hospital Stay (HOSPITAL_COMMUNITY): Payer: Medicare Other

## 2022-11-11 DIAGNOSIS — S72001D Fracture of unspecified part of neck of right femur, subsequent encounter for closed fracture with routine healing: Secondary | ICD-10-CM | POA: Diagnosis not present

## 2022-11-11 LAB — CBC WITH DIFFERENTIAL/PLATELET
Abs Immature Granulocytes: 0.13 10*3/uL — ABNORMAL HIGH (ref 0.00–0.07)
Basophils Absolute: 0 10*3/uL (ref 0.0–0.1)
Basophils Relative: 0 %
Eosinophils Absolute: 0.1 10*3/uL (ref 0.0–0.5)
Eosinophils Relative: 1 %
HCT: 28.2 % — ABNORMAL LOW (ref 36.0–46.0)
Hemoglobin: 8.7 g/dL — ABNORMAL LOW (ref 12.0–15.0)
Immature Granulocytes: 1 %
Lymphocytes Relative: 5 %
Lymphs Abs: 0.7 10*3/uL (ref 0.7–4.0)
MCH: 28.7 pg (ref 26.0–34.0)
MCHC: 30.9 g/dL (ref 30.0–36.0)
MCV: 93.1 fL (ref 80.0–100.0)
Monocytes Absolute: 1.1 10*3/uL — ABNORMAL HIGH (ref 0.1–1.0)
Monocytes Relative: 8 %
Neutro Abs: 11.9 10*3/uL — ABNORMAL HIGH (ref 1.7–7.7)
Neutrophils Relative %: 85 %
Platelets: 390 10*3/uL (ref 150–400)
RBC: 3.03 MIL/uL — ABNORMAL LOW (ref 3.87–5.11)
RDW: 17.6 % — ABNORMAL HIGH (ref 11.5–15.5)
WBC: 13.9 10*3/uL — ABNORMAL HIGH (ref 4.0–10.5)
nRBC: 0.1 % (ref 0.0–0.2)

## 2022-11-11 LAB — BASIC METABOLIC PANEL
Anion gap: 12 (ref 5–15)
BUN: 57 mg/dL — ABNORMAL HIGH (ref 8–23)
CO2: 21 mmol/L — ABNORMAL LOW (ref 22–32)
Calcium: 8 mg/dL — ABNORMAL LOW (ref 8.9–10.3)
Chloride: 100 mmol/L (ref 98–111)
Creatinine, Ser: 1.6 mg/dL — ABNORMAL HIGH (ref 0.44–1.00)
GFR, Estimated: 30 mL/min — ABNORMAL LOW (ref >60)
Glucose, Bld: 75 mg/dL (ref 70–99)
Potassium: 4.4 mmol/L (ref 3.5–5.1)
Sodium: 133 mmol/L — ABNORMAL LOW (ref 135–145)

## 2022-11-11 LAB — MAGNESIUM: Magnesium: 2.4 mg/dL (ref 1.7–2.4)

## 2022-11-11 MED ORDER — FUROSEMIDE 10 MG/ML IJ SOLN
20.0000 mg | Freq: Once | INTRAMUSCULAR | Status: AC
  Administered 2022-11-11: 20 mg via INTRAVENOUS
  Filled 2022-11-11: qty 2

## 2022-11-11 NOTE — Progress Notes (Signed)
PROGRESS NOTE    Felicia Benson  ZOX:[id number] DOB: Dec 02, 1930 DOA: 10/28/2022 PCP: [name], MD   Brief Narrative:  89 y.o. female with past medical history significant for chronic respiratory failure/COPD on 4 L nasal cannula at baseline, chronic diastolic congestive heart failure, HTN, HLD, anemia of chronic medical disease presented with right hip and knee pain following a mechanical fall.  She was found to have acute displaced right intertrochanteric femoral fracture.  CT head/C-spine showed no acute intracranial abnormality, stable heterogeneous enlargement of right thyroid that is most likely indicative of multinodular goiter.  She underwent ORIF by orthopedics on 10/30/2022.  Subsequently, PT recommended SNF placement.  Hospital course complicated by ileus.  NG tube placed on 11/06/2022.  GI consulted.  Assessment & Plan:   Displaced right intertrochanteric femur fracture after a mechanical fall -underwent ORIF by orthopedics on 10/30/2022.  -Subsequently, PT recommended SNF placement.  -Wound care/pain management/activity/DVT prophylaxis as per orthopedics recommendations.  Outpatient follow-up with orthopedics  Postoperative ileus -Currently has NG tube.  Abdominal x-ray from 11/10/2022 showed persistent ileus.  Follow abdominal x-ray this morning.  GI signed off on 11/09/2022.  Continue Senokot and MiraLAX.  Patient had 2 small bowel movements overnight.  Patient is adamant that she wants her NG tube removed.  Will remove NG tube and start clear liquid diet.  Acute renal failure -Creatinine worsening to 1.60 today.  She still volume overloaded.  Will give 1 more dose of IV Lasix today.  Monitor labs.  Acute metabolic acidosis -Mild.  Monitor.  Hyperkalemia -Improved  Hyponatremia -Possibly from poor oral intake.  Sodium 133 today. Monitor.    Leukocytosis -Monitor  Chronic respiratory failure with hypoxia/COPD -On 4 L oxygen via nasal cannula normally.  Currently  on the same.  Continue Dulera, Singulair and Incruse Ellipta.  Outpatient follow-up with pulmonary  Chronic diastolic heart failure Essential hypertension -Currently compensated.  Strict input and output.  Daily weights.  Amlodipine, metoprolol and losartan on hold.  Blood pressure on the lower side.  Hyperlipidemia -Hold statin for now  History of Crohn's disease -Hold mesalamine till oral intake resumes.  GI following  Anemia of chronic disease -From chronic illnesses.  Hemoglobin stable.  Monitor intermittently.  Hypoalbuminemia -From poor oral intake.  Goals of care -Patient is DNR.  Overall prognosis is guarded.  Palliative care following.  DVT prophylaxis: Lovenox Code Status: DNR Family Communication: None at bedside Disposition Plan: Status is: Inpatient Remains inpatient appropriate because: Of severity of illness    Consultants: Orthopedic/GI.   palliative care  Procedures: As above  Antimicrobials: Perioperative   Subjective: Patient seen and examined at bedside.  Had 2 episodes of small bowel movement overnight as per nursing staff.  Feels slightly slighty better today.  She wants her NG tube removed.  No chest pain or worsening shortness of breath reported.   Objective: Vitals:   11/10/22 1446 11/10/22 1953 11/10/22 2101 11/11/22 0351  BP: (!) 92/50  100/64 107/65  Pulse: (!) 103  (!) 102 (!) 104  Resp: 18  20 18   Temp: 98.3 F (36.8 C)  98.3 F (36.8 C) 98.3 F (36.8 C)  TempSrc: Oral  Oral Oral  SpO2: 100% 97% 96% 93%  Weight:      Height:        Intake/Output Summary (Last 24 hours) at 11/11/2022 0755 Last data filed at 11/10/2022 1700 Gross per 24 hour  Intake --  Output 1000 ml  Net -1000 ml   Ceasar Mons  Weights   11/06/22 0500 11/07/22 0500 11/09/22 0500  Weight: 97.4 kg 94.3 kg 94.3 kg    Examination:  General: No acute distress.  Remains on 2 L oxygen via nasal cannula.  Looks chronically ill and deconditioned. ENT/neck: NG tube  still present.  No obvious neck masses or JVD elevation noted respiratory: Bilateral decreased breath sounds at bases with scattered crackles CVS: Intermittently tachycardic; S1 and S2 are heard  abdominal: Soft, obese, slightly tender and distended; no organomegaly, slightly bowel sounds  extremities: No clubbing; lower extremity edema present bilaterally  CNS: Awake, poor historian; still slow to respond; no focal neurologic deficit.  Able to move extremities Lymph: No obvious palpable lymphadenopathy Skin: No obvious petechia/lesions  psych: Showing no signs of agitation.  Mostly flat affect  musculoskeletal: No obvious joint ecchymosis/deformity     Data Reviewed: I have personally reviewed following labs and imaging studies  CBC: Recent Labs  Lab 11/07/22 0854 11/08/22 0942 11/09/22 0732 11/10/22 0259 11/11/22 0315  WBC 13.1* 11.7* 11.7* 11.9* 13.9*  NEUTROABS  --  9.3* 9.3* 9.8* 11.9*  HGB 9.4* 9.5* 8.9* 8.9* 8.7*  HCT 29.8* 30.6* 29.0* 28.6* 28.2*  MCV 89.8 90.8 91.2 92.0 93.1  PLT 353 363 345 416* 390   Basic Metabolic Panel: Recent Labs  Lab 11/07/22 0854 11/08/22 0942 11/09/22 0732 11/10/22 0259 11/11/22 0315  NA 129* 129* 131* 130* 133*  K 4.9 4.6 5.2* 5.3* 4.4  CL 96* 98 98 99 100  CO2 24 22 21* 19* 21*  GLUCOSE 91 131* 87 67* 75  BUN 57* 56* 57* 60* 57*  CREATININE 1.53* 1.37* 1.48* 1.59* 1.60*  CALCIUM 7.8* 7.6* 7.7* 8.1* 8.0*  MG 2.4 2.4 2.4 2.4 2.4   GFR: Estimated Creatinine Clearance: 27 mL/min (A) (by C-G formula based on SCr of 1.6 mg/dL (H)). Liver Function Tests: Recent Labs  Lab 11/07/22 0854  AST 23  ALT 13  ALKPHOS 80  BILITOT 1.2  PROT 5.5*  ALBUMIN 1.9*   No results for input(s): "LIPASE", "AMYLASE" in the last 168 hours. No results for input(s): "AMMONIA" in the last 168 hours. Coagulation Profile: No results for input(s): "INR", "PROTIME" in the last 168 hours. Cardiac Enzymes: No results for input(s): "CKTOTAL", "CKMB",  "CKMBINDEX", "TROPONINI" in the last 168 hours. BNP (last 3 results) No results for input(s): "PROBNP" in the last 8760 hours. HbA1C: No results for input(s): "HGBA1C" in the last 72 hours. CBG: No results for input(s): "GLUCAP" in the last 168 hours. Lipid Profile: No results for input(s): "CHOL", "HDL", "LDLCALC", "TRIG", "CHOLHDL", "LDLDIRECT" in the last 72 hours. Thyroid Function Tests: No results for input(s): "TSH", "T4TOTAL", "FREET4", "T3FREE", "THYROIDAB" in the last 72 hours. Anemia Panel: No results for input(s): "VITAMINB12", "FOLATE", "FERRITIN", "TIBC", "IRON", "RETICCTPCT" in the last 72 hours. Sepsis Labs: No results for input(s): "PROCALCITON", "LATICACIDVEN" in the last 168 hours.   No results found for this or any previous visit (from the past 240 hour(s)).        Radiology Studies: DG Abd Portable 1V  Result Date: 11/10/2022 CLINICAL DATA:  Ileus EXAM: PORTABLE ABDOMEN - 1 VIEW COMPARISON:  11/09/2022 FINDINGS: Enteric tube has been advanced with distal tip and side port now projecting within the stomach. Similar abnormal distension of small bowel loops throughout the abdomen. Enteric contrast pattern has not significantly changed compared to the previous study with contrast noted in the region of the splenic flexure and within the rectum. No gross free intraperitoneal air on  supine imaging. IMPRESSION: 1. Enteric tube has been advanced with distal tip and side port now projecting within the stomach. 2. Similar abnormal distension of small bowel loops throughout the abdomen. Electronically Signed   By: Duanne Guess D.O.   On: 11/10/2022 12:33   DG CHEST PORT 1 VIEW  Result Date: 11/09/2022 CLINICAL DATA:  Dyspnea, productive cough. EXAM: PORTABLE CHEST 1 VIEW COMPARISON:  November 02, 2022. FINDINGS: Stable cardiomediastinal silhouette. Endotracheal tube is seen entering stomach. Left-sided pacemaker is unchanged. Stable bibasilar opacities are noted with  probable small left pleural effusion. Bony thorax is unremarkable. IMPRESSION: Stable bibasilar opacities with probable small left pleural effusion. Endotracheal tube is seen entering stomach. Electronically Signed   By: Lupita Raider M.D.   On: 11/09/2022 16:12        Scheduled Meds:  enoxaparin (LOVENOX) injection  30 mg Subcutaneous Q24H   fluticasone  2 spray Each Nare Daily   lidocaine  2 patch Transdermal Q24H   mesalamine  1,000 mg Oral BID   mometasone-formoterol  2 puff Inhalation BID   montelukast  10 mg Oral QPM   pantoprazole (PROTONIX) IV  40 mg Intravenous Q24H   polyethylene glycol  17 g Oral Daily   senna-docusate  2 tablet Oral BID   umeclidinium bromide  1 puff Inhalation Daily   Continuous Infusions:          Glade Lloyd, MD [facility] 11/11/2022, 7:55 AM

## 2022-11-11 NOTE — Progress Notes (Signed)
Patient had 2 episodes of small BM over night.

## 2022-11-12 DIAGNOSIS — S72001D Fracture of unspecified part of neck of right femur, subsequent encounter for closed fracture with routine healing: Secondary | ICD-10-CM | POA: Diagnosis not present

## 2022-11-12 LAB — CBC WITH DIFFERENTIAL/PLATELET
Abs Immature Granulocytes: 0.13 10*3/uL — ABNORMAL HIGH (ref 0.00–0.07)
Basophils Absolute: 0 10*3/uL (ref 0.0–0.1)
Basophils Relative: 0 %
Eosinophils Absolute: 0 10*3/uL (ref 0.0–0.5)
Eosinophils Relative: 0 %
HCT: 30 % — ABNORMAL LOW (ref 36.0–46.0)
Hemoglobin: 9.3 g/dL — ABNORMAL LOW (ref 12.0–15.0)
Immature Granulocytes: 1 %
Lymphocytes Relative: 5 %
Lymphs Abs: 0.7 10*3/uL (ref 0.7–4.0)
MCH: 28.8 pg (ref 26.0–34.0)
MCHC: 31 g/dL (ref 30.0–36.0)
MCV: 92.9 fL (ref 80.0–100.0)
Monocytes Absolute: 0.8 10*3/uL (ref 0.1–1.0)
Monocytes Relative: 6 %
Neutro Abs: 12.3 10*3/uL — ABNORMAL HIGH (ref 1.7–7.7)
Neutrophils Relative %: 88 %
Platelets: 382 10*3/uL (ref 150–400)
RBC: 3.23 MIL/uL — ABNORMAL LOW (ref 3.87–5.11)
RDW: 17.7 % — ABNORMAL HIGH (ref 11.5–15.5)
WBC: 14 10*3/uL — ABNORMAL HIGH (ref 4.0–10.5)
nRBC: 0.1 % (ref 0.0–0.2)

## 2022-11-12 LAB — BASIC METABOLIC PANEL
Anion gap: 12 (ref 5–15)
BUN: 57 mg/dL — ABNORMAL HIGH (ref 8–23)
CO2: 22 mmol/L (ref 22–32)
Calcium: 8.1 mg/dL — ABNORMAL LOW (ref 8.9–10.3)
Chloride: 100 mmol/L (ref 98–111)
Creatinine, Ser: 1.63 mg/dL — ABNORMAL HIGH (ref 0.44–1.00)
GFR, Estimated: 30 mL/min — ABNORMAL LOW (ref >60)
Glucose, Bld: 96 mg/dL (ref 70–99)
Potassium: 4.5 mmol/L (ref 3.5–5.1)
Sodium: 134 mmol/L — ABNORMAL LOW (ref 135–145)

## 2022-11-12 LAB — MAGNESIUM: Magnesium: 2.2 mg/dL (ref 1.7–2.4)

## 2022-11-12 MED ORDER — PANTOPRAZOLE SODIUM 40 MG IV SOLR
40.0000 mg | Freq: Two times a day (BID) | INTRAVENOUS | Status: DC
  Administered 2022-11-12 – 2022-11-14 (×5): 40 mg via INTRAVENOUS
  Filled 2022-11-12 (×5): qty 10

## 2022-11-12 MED ORDER — POLYETHYLENE GLYCOL 3350 17 G PO PACK: 17.0000 g | PACK | Freq: Two times a day (BID) | ORAL | Status: DC

## 2022-11-12 MED ORDER — FUROSEMIDE 10 MG/ML IJ SOLN
20.0000 mg | Freq: Once | INTRAMUSCULAR | Status: AC
  Administered 2022-11-12: 20 mg via INTRAVENOUS
  Filled 2022-11-12: qty 2

## 2022-11-12 NOTE — TOC Progression Note (Signed)
Transition of Care [facility]) - Progression Note    Patient Details  Name: Felicia Benson MRN: [id number] Date of Birth: [birth year]  Transition of [facility]) CM/SW Contact  Lorri Frederick, LCSW Phone Number: 11/12/2022, 11:42 AM  Clinical Narrative:   Per MD, pt not medically stable for DC to SNF.  TOC will continue to follow.      Expected Discharge Plan: Skilled Nursing Facility Barriers to Discharge: Continued Medical Work up, SNF Pending bed offer  Expected Discharge Plan and Services In-house Referral: Clinical Social Work   Post Acute Care Choice: Skilled Nursing Facility Living arrangements for the past 2 months: Single Family Home                                       Social Determinants of Health (SDOH) Interventions SDOH Screenings   Food Insecurity: No Food Insecurity (10/29/2022)  Housing: Low Risk  (10/29/2022)  Transportation Needs: No Transportation Needs (10/29/2022)  Utilities: Not At Risk (11/10/2022)  Tobacco Use: Medium Risk (10/30/2022)    Readmission Risk Interventions    07/24/2022    2:49 PM  Readmission Risk Prevention Plan  Transportation Screening Complete  PCP or Specialist Appt within 5-7 Days Complete  Home Care Screening Complete  Medication Review (RN CM) Complete

## 2022-11-12 NOTE — Progress Notes (Signed)
PROGRESS NOTE    Felicia Benson  RUE:[id number] DOB: Dec 09, 1930 DOA: 10/28/2022 PCP: [name], MD   Brief Narrative:  89 y.o. female with past medical history significant for chronic respiratory failure/COPD on 4 L nasal cannula at baseline, chronic diastolic congestive heart failure, HTN, HLD, anemia of chronic medical disease presented with right hip and knee pain following a mechanical fall.  She was found to have acute displaced right intertrochanteric femoral fracture.  CT head/C-spine showed no acute intracranial abnormality, stable heterogeneous enlargement of right thyroid that is most likely indicative of multinodular goiter.  She underwent ORIF by orthopedics on 10/30/2022.  Subsequently, PT recommended SNF placement.  Hospital course complicated by ileus.  NG tube placed on 11/06/2022.  GI consulted.  GI signed off on 11/09/2022.  NG tube removed on 11/11/2022  Assessment & Plan:   Displaced right intertrochanteric femur fracture after a mechanical fall -underwent ORIF by orthopedics on 10/30/2022.  -Subsequently, PT recommended SNF placement.  -Wound care/pain management/activity/DVT prophylaxis as per orthopedics recommendations.  Outpatient follow-up with orthopedics  Postoperative ileus - GI signed off on 11/09/2022.  Patient having small bowel movements.  NG tube removed on 11/11/2022.  Continue Senokot and MiraLAX.  Currently on clear liquid diet.  Continue the same as patient had vomiting this morning.  Acute renal failure -Creatinine worsening to 1.60 today.  She still looks volume overloaded.  Will give 1 more dose of IV Lasix today.  Monitor labs.  Acute metabolic acidosis -Improved.  Hyperkalemia -Improved  Hyponatremia -Possibly from poor oral intake.  Sodium 134 today. Monitor.    Leukocytosis -Monitor  Chronic respiratory failure with hypoxia/COPD -On 4 L oxygen via nasal cannula normally.  Continue Dulera, Singulair and Incruse Ellipta.  Outpatient  follow-up with pulmonary  Chronic diastolic heart failure Essential hypertension -Currently compensated.  Strict input and output.  Daily weights.  Amlodipine, metoprolol and losartan on hold.  Blood pressure on the lower side.  Hyperlipidemia -Hold statin for now  History of Crohn's disease -Hold mesalamine till oral intake resumes.  GI following  Anemia of chronic disease -From chronic illnesses.  Hemoglobin stable.  Monitor intermittently.  Hypoalbuminemia -From poor oral intake.  Goals of care -Patient is DNR.  Overall prognosis is guarded.  Palliative care following.  DVT prophylaxis: Lovenox Code Status: DNR Family Communication: None at bedside Disposition Plan: Status is: Inpatient Remains inpatient appropriate because: Of severity of illness    Consultants: Orthopedic/GI.   palliative care  Procedures: As above  Antimicrobials: Perioperative   Subjective: Patient seen and examined at bedside.  Denies worsening chest pain, shortness breath, abdominal pain.  Had nausea and vomiting this morning and does not want to advance her diet yet. Objective: Vitals:   11/11/22 2025 11/11/22 2105 11/12/22 0600 11/12/22 0718  BP: 96/69  101/69   Pulse: (!) 108  (!) 115   Resp: 20  19   Temp: 98.7 F (37.1 C)  98.2 F (36.8 C)   TempSrc: Oral  Oral   SpO2:  97% 95%   Weight:    97.1 kg  Height:        Intake/Output Summary (Last 24 hours) at 11/12/2022 0721 Last data filed at 11/12/2022 0700 Gross per 24 hour  Intake --  Output 1000 ml  Net -1000 ml   Filed Weights   11/07/22 0500 11/09/22 0500 11/12/22 0718  Weight: 94.3 kg 94.3 kg 97.1 kg    Examination:  General: On 3 L oxygen by nasal cannula.  No distress.  Looks chronically ill and deconditioned. ENT/neck: No obvious JVD elevation or thyromegaly noted.  NG tube has been removed respiratory: Decreased breath sounds at bases bilaterally with some crackles  CVS: S1-S2 heard; remains tachycardic   abdominal: Soft, obese, mildly tender and slightly distended; no organomegaly, bowel sounds are heard extremities: Bilateral lower extremity pitting edema present; no cyanosis  CNS: Alert; poor historian; slow to respond.  No obvious focal deficits noted  lymph: No cervical lymphadenopathy palpable Skin: No obvious ecchymosis/rashes  psych: Flat affect.  Not agitated currently musculoskeletal: No obvious joint petechia/rashes     Data Reviewed: I have personally reviewed following labs and imaging studies  CBC: Recent Labs  Lab 11/08/22 0942 11/09/22 0732 11/10/22 0259 11/11/22 0315 11/12/22 0257  WBC 11.7* 11.7* 11.9* 13.9* 14.0*  NEUTROABS 9.3* 9.3* 9.8* 11.9* 12.3*  HGB 9.5* 8.9* 8.9* 8.7* 9.3*  HCT 30.6* 29.0* 28.6* 28.2* 30.0*  MCV 90.8 91.2 92.0 93.1 92.9  PLT 363 345 416* 390 382   Basic Metabolic Panel: Recent Labs  Lab 11/08/22 0942 11/09/22 0732 11/10/22 0259 11/11/22 0315 11/12/22 0257  NA 129* 131* 130* 133* 134*  K 4.6 5.2* 5.3* 4.4 4.5  CL 98 98 99 100 100  CO2 22 21* 19* 21* 22  GLUCOSE 131* 87 67* 75 96  BUN 56* 57* 60* 57* 57*  CREATININE 1.37* 1.48* 1.59* 1.60* 1.63*  CALCIUM 7.6* 7.7* 8.1* 8.0* 8.1*  MG 2.4 2.4 2.4 2.4 2.2   GFR: Estimated Creatinine Clearance: 26.9 mL/min (A) (by C-G formula based on SCr of 1.63 mg/dL (H)). Liver Function Tests: Recent Labs  Lab 11/07/22 0854  AST 23  ALT 13  ALKPHOS 80  BILITOT 1.2  PROT 5.5*  ALBUMIN 1.9*   No results for input(s): "LIPASE", "AMYLASE" in the last 168 hours. No results for input(s): "AMMONIA" in the last 168 hours. Coagulation Profile: No results for input(s): "INR", "PROTIME" in the last 168 hours. Cardiac Enzymes: No results for input(s): "CKTOTAL", "CKMB", "CKMBINDEX", "TROPONINI" in the last 168 hours. BNP (last 3 results) No results for input(s): "PROBNP" in the last 8760 hours. HbA1C: No results for input(s): "HGBA1C" in the last 72 hours. CBG: No results for input(s):  "GLUCAP" in the last 168 hours. Lipid Profile: No results for input(s): "CHOL", "HDL", "LDLCALC", "TRIG", "CHOLHDL", "LDLDIRECT" in the last 72 hours. Thyroid Function Tests: No results for input(s): "TSH", "T4TOTAL", "FREET4", "T3FREE", "THYROIDAB" in the last 72 hours. Anemia Panel: No results for input(s): "VITAMINB12", "FOLATE", "FERRITIN", "TIBC", "IRON", "RETICCTPCT" in the last 72 hours. Sepsis Labs: No results for input(s): "PROCALCITON", "LATICACIDVEN" in the last 168 hours.   No results found for this or any previous visit (from the past 240 hour(s)).        Radiology Studies: DG Abd Portable 1V  Result Date: 11/11/2022 CLINICAL DATA:  Ileus EXAM: PORTABLE ABDOMEN - 1 VIEW COMPARISON:  KUB 1 day prior FINDINGS: The enteric catheter is coiled in the stomach. There is persistent and unchanged diffuse gaseous distention of the bowel throughout the abdomen. Contrast is again seen extending to the rectum. There is no definite free intraperitoneal air. Cardiac device leads are noted. There is probable bibasilar subsegmental atelectasis. There is no acute osseous abnormality. Postsurgical changes are again noted in the right femur. IMPRESSION: 1. Enteric catheter coiled in the stomach. 2. Unchanged diffuse gaseous distention of the bowel throughout the abdomen. Electronically Signed   By: Lesia Hausen M.D.   On: 11/11/2022 10:15  DG Abd Portable 1V  Result Date: 11/10/2022 CLINICAL DATA:  Ileus EXAM: PORTABLE ABDOMEN - 1 VIEW COMPARISON:  11/09/2022 FINDINGS: Enteric tube has been advanced with distal tip and side port now projecting within the stomach. Similar abnormal distension of small bowel loops throughout the abdomen. Enteric contrast pattern has not significantly changed compared to the previous study with contrast noted in the region of the splenic flexure and within the rectum. No gross free intraperitoneal air on supine imaging. IMPRESSION: 1. Enteric tube has been advanced  with distal tip and side port now projecting within the stomach. 2. Similar abnormal distension of small bowel loops throughout the abdomen. Electronically Signed   By: Duanne Guess D.O.   On: 11/10/2022 12:33        Scheduled Meds:  enoxaparin (LOVENOX) injection  30 mg Subcutaneous Q24H   fluticasone  2 spray Each Nare Daily   lidocaine  2 patch Transdermal Q24H   mesalamine  1,000 mg Oral BID   mometasone-formoterol  2 puff Inhalation BID   montelukast  10 mg Oral QPM   pantoprazole (PROTONIX) IV  40 mg Intravenous Q24H   polyethylene glycol  17 g Oral Daily   senna-docusate  2 tablet Oral BID   umeclidinium bromide  1 puff Inhalation Daily   Continuous Infusions:          Glade Lloyd, MD [facility] 11/12/2022, 7:21 AM

## 2022-11-12 NOTE — Care Management Important Message (Signed)
Important Message  Patient Details  Name: Felicia Benson MRN: [id number] Date of Birth: Jun 02, 1930   Important Message Given:  Yes - Medicare IM     Sherilyn Banker 11/12/2022, 1:25 PM

## 2022-11-12 NOTE — Progress Notes (Signed)
Physical Therapy Treatment Patient Details Name: Felicia Benson MRN: [id number] DOB: 1930/04/15 Today's Date: 11/12/2022   History of Present Illness Pt is a 89 y/o female presenting on 10/20 after fall.  Found with R hip fx, s/p IM nail 10/22.  PMH includes: chronic hypoxic respiratory failure on baseline and 4 L continuous nasal cannula, COPD, chronic diastolic heart failure, essential pretension, hyperlipidemia anemia of chronic disease is a baseline hemoglobin range 10-12.    PT Comments  Pt seen for PT tx with pt agreeable. Pt noted to have low BP at baseline but no symptoms reported; RUE 85/58 mmHg MAP 68 so OOB mobility deferred at this time. Pt performed RLE strengthening exercises with AAROM & cuing for technique. Pt demonstrates significant weakness in RLE & edema in all extremities. Of note, pt reports impaired vision at baseline. Will continue to follow pt acutely to progress mobility as able.   If plan is discharge home, recommend the following: Two people to help with walking and/or transfers;Assist for transportation;Two people to help with bathing/dressing/bathroom   Can travel by private vehicle     No  Equipment Recommendations  Wheelchair (measurements PT);Wheelchair cushion (measurements PT);Hospital bed;Hoyer lift    Recommendations for Other Services       Precautions / Restrictions Precautions Precautions: Fall;Other (comment) Precaution Comments: BPs soft, oxygen 4L at baseline Restrictions Weight Bearing Restrictions: Yes RLE Weight Bearing: Weight bearing as tolerated     Mobility  Bed Mobility                    Transfers                        Ambulation/Gait                   Stairs             Wheelchair Mobility     Tilt Bed    Modified Rankin (Stroke Patients Only)       Balance                                            Cognition Arousal: Alert Behavior During Therapy: WFL for  tasks assessed/performed Overall Cognitive Status: Within Functional Limits for tasks assessed                                 General Comments: Follows simple commands throughout session        Exercises General Exercises - Lower Extremity Ankle Circles/Pumps: Supine, AROM, Right, 10 reps Quad Sets: AROM, Supine, Strengthening, Right, 20 reps Short Arc Quad: AAROM, Supine, Strengthening, Right, 20 reps Heel Slides: Supine, AAROM, Strengthening, Right, 20 reps Hip ABduction/ADduction: AAROM, Supine, Strengthening, AROM, Right, Both, 20 reps (20 reps RLE hip abduction AAROM, hip adduction x 20 AROM) Straight Leg Raises: AAROM, Supine, Strengthening, Right, 10 reps    General Comments        Pertinent Vitals/Pain Pain Assessment Pain Assessment: No/denies pain    Home Living                          Prior Function            PT Goals (current goals can now be found in the  care plan section) Acute Rehab PT Goals Patient Stated Goal: pain control, to walk PT Goal Formulation: With patient/family Time For Goal Achievement: 11/14/22 Potential to Achieve Goals: Fair Progress towards PT goals: Progressing toward goals    Frequency    Min 1X/week      PT Plan      Co-evaluation              AM-PAC PT "6 Clicks" Mobility   Outcome Measure  Help needed turning from your back to your side while in a flat bed without using bedrails?: Total Help needed moving from lying on your back to sitting on the side of a flat bed without using bedrails?: Total Help needed moving to and from a bed to a chair (including a wheelchair)?: Total Help needed standing up from a chair using your arms (e.g., wheelchair or bedside chair)?: Total Help needed to walk in hospital room?: Total Help needed climbing 3-5 steps with a railing? : Total 6 Click Score: 6    End of Session Equipment Utilized During Treatment: Oxygen Activity Tolerance: Treatment limited  secondary to medical complications (Comment);Patient tolerated treatment well Patient left: in bed;with bed alarm set;with call bell/phone within reach Nurse Communication: Mobility status PT Visit Diagnosis: History of falling (Z91.81);Muscle weakness (generalized) (M62.81);Difficulty in walking, not elsewhere classified (R26.2);Other abnormalities of gait and mobility (R26.89)     Time: 7829-5621 PT Time Calculation (min) (ACUTE ONLY): 17 min  Charges:    $Therapeutic Exercise: 8-22 mins PT General Charges $$ ACUTE PT VISIT: 1 Visit                     Aleda Grana, PT, DPT 11/12/22, 1:59 PM   Sandi Mariscal 11/12/2022, 1:58 PM

## 2022-11-12 NOTE — Progress Notes (Signed)
   11/12/22 1909  Assess: MEWS Score  Temp 98.1 F (36.7 C)  BP (!) 104/57  MAP (mmHg) 69  Pulse Rate 98  Resp 18  SpO2 93 %  O2 Device Room Air  Assess: MEWS Score  MEWS Temp 0  MEWS Systolic 0  MEWS Pulse 0  MEWS RR 0  MEWS LOC 0  MEWS Score 0  MEWS Score Color Green  Assess: SIRS CRITERIA  SIRS Temperature  0  SIRS Pulse 1  SIRS Respirations  0  SIRS WBC 0  SIRS Score Sum  1

## 2022-11-13 ENCOUNTER — Inpatient Hospital Stay (HOSPITAL_COMMUNITY): Payer: Medicare Other

## 2022-11-13 DIAGNOSIS — S72001D Fracture of unspecified part of neck of right femur, subsequent encounter for closed fracture with routine healing: Secondary | ICD-10-CM | POA: Diagnosis not present

## 2022-11-13 LAB — CBC WITH DIFFERENTIAL/PLATELET
Abs Immature Granulocytes: 0.08 10*3/uL — ABNORMAL HIGH (ref 0.00–0.07)
Basophils Absolute: 0 10*3/uL (ref 0.0–0.1)
Basophils Relative: 0 %
Eosinophils Absolute: 0.2 10*3/uL (ref 0.0–0.5)
Eosinophils Relative: 1 %
HCT: 27.3 % — ABNORMAL LOW (ref 36.0–46.0)
Hemoglobin: 8.5 g/dL — ABNORMAL LOW (ref 12.0–15.0)
Immature Granulocytes: 1 %
Lymphocytes Relative: 6 %
Lymphs Abs: 0.7 10*3/uL (ref 0.7–4.0)
MCH: 29.1 pg (ref 26.0–34.0)
MCHC: 31.1 g/dL (ref 30.0–36.0)
MCV: 93.5 fL (ref 80.0–100.0)
Monocytes Absolute: 0.9 10*3/uL (ref 0.1–1.0)
Monocytes Relative: 7 %
Neutro Abs: 9.9 10*3/uL — ABNORMAL HIGH (ref 1.7–7.7)
Neutrophils Relative %: 85 %
Platelets: 319 10*3/uL (ref 150–400)
RBC: 2.92 MIL/uL — ABNORMAL LOW (ref 3.87–5.11)
RDW: 18 % — ABNORMAL HIGH (ref 11.5–15.5)
WBC: 11.7 10*3/uL — ABNORMAL HIGH (ref 4.0–10.5)
nRBC: 0.2 % (ref 0.0–0.2)

## 2022-11-13 LAB — COMPREHENSIVE METABOLIC PANEL
ALT: 12 U/L (ref 0–44)
AST: 23 U/L (ref 15–41)
Albumin: 1.7 g/dL — ABNORMAL LOW (ref 3.5–5.0)
Alkaline Phosphatase: 82 U/L (ref 38–126)
Anion gap: 6 (ref 5–15)
BUN: 56 mg/dL — ABNORMAL HIGH (ref 8–23)
CO2: 26 mmol/L (ref 22–32)
Calcium: 7.6 mg/dL — ABNORMAL LOW (ref 8.9–10.3)
Chloride: 99 mmol/L (ref 98–111)
Creatinine, Ser: 1.71 mg/dL — ABNORMAL HIGH (ref 0.44–1.00)
GFR, Estimated: 28 mL/min — ABNORMAL LOW (ref >60)
Glucose, Bld: 150 mg/dL — ABNORMAL HIGH (ref 70–99)
Potassium: 3.3 mmol/L — ABNORMAL LOW (ref 3.5–5.1)
Sodium: 131 mmol/L — ABNORMAL LOW (ref 135–145)
Total Bilirubin: 1 mg/dL (ref <1.2)
Total Protein: 4.6 g/dL — ABNORMAL LOW (ref 6.5–8.1)

## 2022-11-13 LAB — MAGNESIUM: Magnesium: 2.1 mg/dL (ref 1.7–2.4)

## 2022-11-13 MED ORDER — OXYCODONE HCL 5 MG PO TABS: 2.5000 mg | ORAL_TABLET | ORAL | 0 refills | Status: AC | PRN

## 2022-11-13 MED ORDER — ENOXAPARIN SODIUM 40 MG/0.4ML IJ SOSY
40.0000 mg | PREFILLED_SYRINGE | INTRAMUSCULAR | 0 refills | Status: DC
Start: 2022-10-31 — End: 2022-11-13

## 2022-11-13 MED ORDER — ENOXAPARIN SODIUM 40 MG/0.4ML IJ SOSY: 40.0000 mg | PREFILLED_SYRINGE | INTRAMUSCULAR | 0 refills | Status: DC

## 2022-11-13 MED ORDER — POTASSIUM CHLORIDE CRYS ER 20 MEQ PO TBCR
40.0000 meq | EXTENDED_RELEASE_TABLET | Freq: Once | ORAL | Status: AC
  Administered 2022-11-13: 40 meq via ORAL
  Filled 2022-11-13: qty 2

## 2022-11-13 MED ORDER — FUROSEMIDE 10 MG/ML IJ SOLN
40.0000 mg | Freq: Once | INTRAMUSCULAR | Status: AC
  Administered 2022-11-13: 40 mg via INTRAVENOUS
  Filled 2022-11-13: qty 4

## 2022-11-13 MED ORDER — OXYCODONE HCL 5 MG PO TABS: 5.0000 mg | ORAL_TABLET | ORAL | 0 refills | Status: DC | PRN

## 2022-11-13 MED ORDER — POLYETHYLENE GLYCOL 3350 17 G PO PACK: 17.0000 g | PACK | Freq: Every day | ORAL | Status: DC | PRN

## 2022-11-13 NOTE — Progress Notes (Signed)
Occupational Therapy Treatment Patient Details Name: Felicia Benson MRN: [id number] DOB: 04/26/1930 Today's Date: 11/13/2022   History of present illness Pt is a 89 y/o female presenting on 10/20 after fall.  Found with R hip fx, s/p IM nail 10/22.  PMH includes: chronic hypoxic respiratory failure on baseline and 4 L continuous nasal cannula, COPD, chronic diastolic heart failure, essential pretension, hyperlipidemia anemia of chronic disease is a baseline hemoglobin range 10-12.   OT comments  Pt in good spirits, eager to participate and improve, c/o minimal to no pain. Pt assisted to EOB, mod-max A for in/out of bed. Pt able to sit on EOB as needed, good sitting balance. NT assisted with STS and hygiene. Pt attempted to stand with RW from elevated surface, unable to. Pt able to briefly stand with Stedy from maximally elevated surface, would benefit from [facility] for transfers at this time, use Stedy for progressing STS. Pt at bed level for toileting hygiene, total A, able to assist with rolling L/R. Pt would benefit greatly from continued skilled therapy to maximize progress as able, DC to postacute rehab still appropriate.       If plan is discharge home, recommend the following:  Two people to help with walking and/or transfers;Two people to help with bathing/dressing/bathroom   Equipment Recommendations  Other (comment) (defer)    Recommendations for Other Services      Precautions / Restrictions Precautions Precautions: Fall;Other (comment) Precaution Comments: BPs soft, oxygen 4L at baseline Restrictions Weight Bearing Restrictions: Yes RLE Weight Bearing: Weight bearing as tolerated       Mobility Bed Mobility Overal bed mobility: Needs Assistance Bed Mobility: Supine to Sit, Sit to Sidelying Rolling: Mod assist, Used rails Sidelying to sit: Mod assist, +2 for physical assistance, Used rails, HOB elevated     Sit to sidelying: Max assist, +2 for physical  assistance General bed mobility comments: mod-max A for in/out of bed, assist for BLEs and scooting to EOB.    Transfers Overall transfer level: Needs assistance Equipment used: Ambulation equipment used Transfers: Sit to/from Stand Sit to Stand: Max assist, From elevated surface, Via lift equipment, +2 physical assistance           General transfer comment: max A x2 for STS from maximally elevated surface with Stedy, not be able to stand from recliner or BSC height. Transfer via Lift Equipment: Stedy   Balance Overall balance assessment: Needs assistance Sitting-balance support: Bilateral upper extremity supported, Feet supported Sitting balance-Leahy Scale: Fair Sitting balance - Comments: able to sit EOB as needed   Standing balance support: Bilateral upper extremity supported, During functional activity, Reliant on assistive device for balance Standing balance-Leahy Scale: Zero Standing balance comment: reliant on assistive device, max A x2 with stedy from elevated surface.                           ADL either performed or assessed with clinical judgement   ADL Overall ADL's : Needs assistance/impaired Eating/Feeding: Independent;Sitting   Grooming: Set up;Sitting                   Toilet Transfer: Total assistance   Toileting- Clothing Manipulation and Hygiene: Maximal assistance;Bed level         General ADL Comments: Pt at bed level for toileting, attempted STS with Stedy, able to stand from max elevated position using Stedy, not able to power STS from lower heights, maximove for t/fs at this  time    Extremity/Trunk Assessment              Vision       Perception     Praxis      Cognition Arousal: Alert Behavior During Therapy: WFL for tasks assessed/performed   Area of Impairment: Following commands, Problem solving                               General Comments: able to fully participate today, pleasant,  conversational.        Exercises      Shoulder Instructions       General Comments      Pertinent Vitals/ Pain       Pain Assessment Pain Assessment: 0-10 Pain Score: 2  Faces Pain Scale: Hurts a little bit Pain Location: bilateral hips R>L Pain Descriptors / Indicators: Discomfort Pain Intervention(s): Monitored during session  Home Living                                          Prior Functioning/Environment              Frequency  Min 1X/week        Progress Toward Goals  OT Goals(current goals can now be found in the care plan section)  Progress towards OT goals: Progressing toward goals  Acute Rehab OT Goals Patient Stated Goal: to improve BLE strength OT Goal Formulation: With patient Time For Goal Achievement: 11/15/22 Potential to Achieve Goals: Fair ADL Goals Pt Will Perform Grooming: with set-up;sitting Pt Will Perform Upper Body Dressing: with set-up;sitting Pt Will Transfer to Toilet: with max assist;with +2 assist;stand pivot transfer;bedside commode Additional ADL Goal #1: Pt will complete bed mobility with mod assist +2. Additional ADL Goal #2: Pt will maintain sitting balance at EOB during ADLs with no more than supervision for 5 minutes.  Plan      Co-evaluation                 AM-PAC OT "6 Clicks" Daily Activity     Outcome Measure   Help from another person eating meals?: None Help from another person taking care of personal grooming?: A Little Help from another person toileting, which includes using toliet, bedpan, or urinal?: Total Help from another person bathing (including washing, rinsing, drying)?: A Lot Help from another person to put on and taking off regular upper body clothing?: A Lot Help from another person to put on and taking off regular lower body clothing?: Total 6 Click Score: 13    End of Session Equipment Utilized During Treatment: Oxygen;Gait belt;Other (comment) Antony Salmon)  OT Visit  Diagnosis: Other abnormalities of gait and mobility (R26.89);Muscle weakness (generalized) (M62.81);Pain;History of falling (Z91.81) Pain - Right/Left: Right Pain - part of body: Hip   Activity Tolerance Patient tolerated treatment well   Patient Left in bed;with call bell/phone within reach;with nursing/sitter in room   Nurse Communication Mobility status        Time: 6045-4098 OT Time Calculation (min): 18 min  Charges: OT General Charges $OT Visit: 1 Visit OT Treatments $Self Care/Home Management : 8-22 mins  [place], OTR/L   Alexis Goodell 11/13/2022, 5:21 PM

## 2022-11-13 NOTE — Progress Notes (Signed)
14 Days Post-Op Procedure(s) (LRB): INTRAMEDULLARY nailing of right femur (Right)  Subjective: Patient reports pain as mild and improved.  Lying comfortably in bed this morning. Worked with PT yesterday, has been more mobile but overall still difficulty with mobilization/gait/weakness and BLE edema.  Yesterday became hypotensive and further activities were deferred.  Eager to do more with therapy.  Anticipate SNF placement.  Objective:   VITALS:   Vitals:   11/12/22 1424 11/12/22 1909 11/13/22 0425 11/13/22 0500  BP: (!) 95/57 (!) 104/57 (!) 91/48   Pulse: 98 98 85   Resp: 18 18 18    Temp: 98.2 F (36.8 C) 98.1 F (36.7 C) (!) 97.5 F (36.4 C)   TempSrc: Oral Oral Oral   SpO2: 92% 93% 95%   Weight:    97.6 kg  Height:        Laying comfortably in bed, AAOx4 Sensation intact distally Intact pulses distally Dorsiflexion/Plantar flexion intact Compartment soft Bandages intact, removed, incisions healing well without erythema or drainage Mild bruising around right knee, nontender Tolerates gentle hip ROM without significant pain BLE mild-moderate edema Wiggles toes appropriately    Lab Results  Component Value Date   WBC 11.7 (H) 11/13/2022   HGB 8.5 (L) 11/13/2022   HCT 27.3 (L) 11/13/2022   MCV 93.5 11/13/2022   PLT 319 11/13/2022   BMET    Component Value Date/Time   NA 131 (L) 11/13/2022 0249   K 3.3 (L) 11/13/2022 0249   CL 99 11/13/2022 0249   CO2 26 11/13/2022 0249   GLUCOSE 150 (H) 11/13/2022 0249   BUN 56 (H) 11/13/2022 0249   CREATININE 1.71 (H) 11/13/2022 0249   CALCIUM 7.6 (L) 11/13/2022 0249   GFRNONAA 28 (L) 11/13/2022 0249     XRAYS:  10/22: 3 views right hip/pelvis demonstrate well reduced intertroch fx, hardware in place without adverse features 11/5: 3 views right hip/pelvis show stable post-operative imaging   Assessment/Plan: 14 Days Post-Op   Principal Problem:   Closed right hip fracture (HCC) Active Problems:   Essential  hypertension   Chronic hypoxic respiratory failure (HCC)   Hyperlipidemia   Chronic diastolic CHF (congestive heart failure) (HCC)   Fall at home, initial encounter   History of COPD   Allergic rhinitis   Anemia of chronic disease   Ileus, postoperative (HCC)  S/p right Intertroch femur fracture ORIF 10/30/22  Narrative:  11/1: Continue mobilizing with PT.  Advance diet per GI.  Nurse notified of urine situation.  Bandages changed.  Plan to reassess Monday. 11/5: Bandage removed today.  Pain and hip mobility in general improved, still hasn't ambulated though.  Continue PT/OT.  Hgb 8.5.  Xrays reviewed, appear stable.  Patient to follow up in office in 6 weeks from surgery.  Post op recs: WB: WBAT RLE Abx: ancef x23 hours post op Imaging: PACU xrays Dressing: wound open to air, no creams or lotions applied to area for 6 weeks from surgery date, no submersion for 8 weeks from surgery date DVT prophylaxis: lovenox starting POD1 x4 weeks Follow up: 6 weeks after surgery for a wound check  and repeat x-rays with Dr. Blanchie Dessert at [facility].  Address: [address], [place], [place] [address]  Office Phone: [phone]   Felicia Benson 11/13/2022, 7:18 AM   Contact information:   Weekdays 7am-5pm epic message Dr. Blanchie Dessert, or call office for patient follow up: [phone] After hours and holidays please check [email or web address] for group  call information for [facility]

## 2022-11-13 NOTE — Plan of Care (Signed)

## 2022-11-13 NOTE — Progress Notes (Signed)
PROGRESS NOTE    Felicia Benson  ZHY:[id number] DOB: 1930-03-27 DOA: 10/28/2022 PCP: [name], MD   Brief Narrative:  89 y.o. female with past medical history significant for chronic respiratory failure/COPD on 4 L nasal cannula at baseline, chronic diastolic congestive heart failure, HTN, HLD, anemia of chronic medical disease presented with right hip and knee pain following a mechanical fall.  She was found to have acute displaced right intertrochanteric femoral fracture.  CT head/C-spine showed no acute intracranial abnormality, stable heterogeneous enlargement of right thyroid that is most likely indicative of multinodular goiter.  She underwent ORIF by orthopedics on 10/30/2022.  Subsequently, PT recommended SNF placement.  Hospital course complicated by ileus.  NG tube placed on 11/06/2022.  GI consulted.  GI signed off on 11/09/2022.  NG tube removed on 11/11/2022  Assessment & Plan:   Displaced right intertrochanteric femur fracture after a mechanical fall -underwent ORIF by orthopedics on 10/30/2022.  -Subsequently, PT recommended SNF placement.  -Wound care/pain management/activity/DVT prophylaxis as per orthopedics recommendations.  Outpatient follow-up with orthopedics  Postoperative ileus - GI signed off on 11/09/2022.  Patient having small bowel movements.  NG tube removed on 11/11/2022.  Patient having diarrhea now and is requesting to hold off on laxatives today.  Will DC Senokot and switch MiraLAX to as needed.  Advance diet to soft diet today.  Acute renal failure -Creatinine worsening to 1.71 today.  She still looks volume overloaded.  Will give 1 more dose of IV Lasix today.  Monitor labs.  Acute metabolic acidosis -Improved.  Hyperkalemia -Improved  Hypokalemia -Mild.  Monitor  Hyponatremia -Possibly from poor oral intake.  Sodium 131 today. Monitor.    Leukocytosis -Improving.  Monitor  Chronic respiratory failure with hypoxia/COPD -On 4 L oxygen via  nasal cannula normally.  Continue Dulera, Singulair and Incruse Ellipta.  Outpatient follow-up with pulmonary  Chronic diastolic heart failure Essential hypertension -Currently compensated.  Strict input and output.  Daily weights.  Amlodipine, metoprolol and losartan on hold.  Blood pressure on the lower side.  Diuretic plan as above: Will give 1 dose of IV Lasix 40 mg today  Hyperlipidemia -Hold statin for now  History of Crohn's disease -Hold mesalamine till oral intake improves.  Outpatient follow-up with GI  Anemia of chronic disease -From chronic illnesses.  Hemoglobin stable.  Monitor intermittently.  Hypoalbuminemia -From poor oral intake.  Goals of care -Patient is DNR.  Overall prognosis is guarded.  Palliative care following intermittently.  DVT prophylaxis: Lovenox Code Status: DNR Family Communication: None at bedside Disposition Plan: Status is: Inpatient Remains inpatient appropriate because: Of severity of illness    Consultants: Orthopedic/GI.   palliative care  Procedures: As above  Antimicrobials: Perioperative   Subjective: Patient seen and examined at bedside.  Denies worsening abdominal pain, fever or chest pain.  Complains of diarrhea. Objective: Vitals:   11/12/22 1424 11/12/22 1909 11/13/22 0425 11/13/22 0500  BP: (!) 95/57 (!) 104/57 (!) 91/48   Pulse: 98 98 85   Resp: 18 18 18    Temp: 98.2 F (36.8 C) 98.1 F (36.7 C) (!) 97.5 F (36.4 C)   TempSrc: Oral Oral Oral   SpO2: 92% 93% 95%   Weight:    97.6 kg  Height:       No intake or output data in the 24 hours ending 11/13/22 0732  Filed Weights   11/09/22 0500 11/12/22 0718 11/13/22 0500  Weight: 94.3 kg 97.1 kg 97.6 kg    Examination:  General: No acute distress.  Remains on 3 L oxygen via nasal cannula.  Looks chronically ill and deconditioned. ENT/neck: No palpable neck masses or elevated JVD noted  respiratory: Bilateral decreased breath sound at bases with scattered  crackles CVS: Rate currently controlled; S1 and S2 are heard  abdominal: Soft, obese, remains distended and mildly tender; no organomegaly, bowel sounds are sluggish extremities: No clubbing; mild lower extremity edema present  CNS: Awake, answering some questions appropriately.  No focal deficits noted lymph: No obvious lymphadenopathy palpable  skin: No obvious lesions/petechiae psych: Currently not agitated.  Affect is flat.   Musculoskeletal: No obvious joint tenderness/deformity    Data Reviewed: I have personally reviewed following labs and imaging studies  CBC: Recent Labs  Lab 11/09/22 0732 11/10/22 0259 11/11/22 0315 11/12/22 0257 11/13/22 0249  WBC 11.7* 11.9* 13.9* 14.0* 11.7*  NEUTROABS 9.3* 9.8* 11.9* 12.3* 9.9*  HGB 8.9* 8.9* 8.7* 9.3* 8.5*  HCT 29.0* 28.6* 28.2* 30.0* 27.3*  MCV 91.2 92.0 93.1 92.9 93.5  PLT 345 416* 390 382 319   Basic Metabolic Panel: Recent Labs  Lab 11/09/22 0732 11/10/22 0259 11/11/22 0315 11/12/22 0257 11/13/22 0249  NA 131* 130* 133* 134* 131*  K 5.2* 5.3* 4.4 4.5 3.3*  CL 98 99 100 100 99  CO2 21* 19* 21* 22 26  GLUCOSE 87 67* 75 96 150*  BUN 57* 60* 57* 57* 56*  CREATININE 1.48* 1.59* 1.60* 1.63* 1.71*  CALCIUM 7.7* 8.1* 8.0* 8.1* 7.6*  MG 2.4 2.4 2.4 2.2 2.1   GFR: Estimated Creatinine Clearance: 25.8 mL/min (A) (by C-G formula based on SCr of 1.71 mg/dL (H)). Liver Function Tests: Recent Labs  Lab 11/07/22 0854 11/13/22 0249  AST 23 23  ALT 13 12  ALKPHOS 80 82  BILITOT 1.2 1.0  PROT 5.5* 4.6*  ALBUMIN 1.9* 1.7*   No results for input(s): "LIPASE", "AMYLASE" in the last 168 hours. No results for input(s): "AMMONIA" in the last 168 hours. Coagulation Profile: No results for input(s): "INR", "PROTIME" in the last 168 hours. Cardiac Enzymes: No results for input(s): "CKTOTAL", "CKMB", "CKMBINDEX", "TROPONINI" in the last 168 hours. BNP (last 3 results) No results for input(s): "PROBNP" in the last 8760  hours. HbA1C: No results for input(s): "HGBA1C" in the last 72 hours. CBG: No results for input(s): "GLUCAP" in the last 168 hours. Lipid Profile: No results for input(s): "CHOL", "HDL", "LDLCALC", "TRIG", "CHOLHDL", "LDLDIRECT" in the last 72 hours. Thyroid Function Tests: No results for input(s): "TSH", "T4TOTAL", "FREET4", "T3FREE", "THYROIDAB" in the last 72 hours. Anemia Panel: No results for input(s): "VITAMINB12", "FOLATE", "FERRITIN", "TIBC", "IRON", "RETICCTPCT" in the last 72 hours. Sepsis Labs: No results for input(s): "PROCALCITON", "LATICACIDVEN" in the last 168 hours.   No results found for this or any previous visit (from the past 240 hour(s)).        Radiology Studies: DG Abd Portable 1V  Result Date: 11/11/2022 CLINICAL DATA:  Ileus EXAM: PORTABLE ABDOMEN - 1 VIEW COMPARISON:  KUB 1 day prior FINDINGS: The enteric catheter is coiled in the stomach. There is persistent and unchanged diffuse gaseous distention of the bowel throughout the abdomen. Contrast is again seen extending to the rectum. There is no definite free intraperitoneal air. Cardiac device leads are noted. There is probable bibasilar subsegmental atelectasis. There is no acute osseous abnormality. Postsurgical changes are again noted in the right femur. IMPRESSION: 1. Enteric catheter coiled in the stomach. 2. Unchanged diffuse gaseous distention of the bowel throughout the  abdomen. Electronically Signed   By: Lesia Hausen M.D.   On: 11/11/2022 10:15        Scheduled Meds:  enoxaparin (LOVENOX) injection  30 mg Subcutaneous Q24H   fluticasone  2 spray Each Nare Daily   lidocaine  2 patch Transdermal Q24H   mesalamine  1,000 mg Oral BID   mometasone-formoterol  2 puff Inhalation BID   montelukast  10 mg Oral QPM   pantoprazole (PROTONIX) IV  40 mg Intravenous Q12H   polyethylene glycol  17 g Oral BID   senna-docusate  2 tablet Oral BID   umeclidinium bromide  1 puff Inhalation Daily   Continuous  Infusions:          Glade Lloyd, MD [facility] 11/13/2022, 7:32 AM

## 2022-11-14 DIAGNOSIS — J9611 Chronic respiratory failure with hypoxia: Secondary | ICD-10-CM | POA: Diagnosis not present

## 2022-11-14 DIAGNOSIS — I5032 Chronic diastolic (congestive) heart failure: Secondary | ICD-10-CM | POA: Diagnosis not present

## 2022-11-14 DIAGNOSIS — S72001D Fracture of unspecified part of neck of right femur, subsequent encounter for closed fracture with routine healing: Secondary | ICD-10-CM | POA: Diagnosis not present

## 2022-11-14 DIAGNOSIS — D638 Anemia in other chronic diseases classified elsewhere: Secondary | ICD-10-CM | POA: Diagnosis not present

## 2022-11-14 LAB — CBC WITH DIFFERENTIAL/PLATELET
Abs Immature Granulocytes: 0.06 10*3/uL (ref 0.00–0.07)
Basophils Absolute: 0 10*3/uL (ref 0.0–0.1)
Basophils Relative: 0 %
Eosinophils Absolute: 0.2 10*3/uL (ref 0.0–0.5)
Eosinophils Relative: 2 %
HCT: 25.6 % — ABNORMAL LOW (ref 36.0–46.0)
Hemoglobin: 8.1 g/dL — ABNORMAL LOW (ref 12.0–15.0)
Immature Granulocytes: 1 %
Lymphocytes Relative: 9 %
Lymphs Abs: 0.8 10*3/uL (ref 0.7–4.0)
MCH: 29.6 pg (ref 26.0–34.0)
MCHC: 31.6 g/dL (ref 30.0–36.0)
MCV: 93.4 fL (ref 80.0–100.0)
Monocytes Absolute: 0.7 10*3/uL (ref 0.1–1.0)
Monocytes Relative: 9 %
Neutro Abs: 7 10*3/uL (ref 1.7–7.7)
Neutrophils Relative %: 79 %
Platelets: 304 10*3/uL (ref 150–400)
RBC: 2.74 MIL/uL — ABNORMAL LOW (ref 3.87–5.11)
RDW: 18.3 % — ABNORMAL HIGH (ref 11.5–15.5)
WBC: 8.7 10*3/uL (ref 4.0–10.5)
nRBC: 0.6 % — ABNORMAL HIGH (ref 0.0–0.2)

## 2022-11-14 LAB — BASIC METABOLIC PANEL
Anion gap: 6 (ref 5–15)
BUN: 58 mg/dL — ABNORMAL HIGH (ref 8–23)
CO2: 27 mmol/L (ref 22–32)
Calcium: 7.3 mg/dL — ABNORMAL LOW (ref 8.9–10.3)
Chloride: 98 mmol/L (ref 98–111)
Creatinine, Ser: 1.43 mg/dL — ABNORMAL HIGH (ref 0.44–1.00)
GFR, Estimated: 35 mL/min — ABNORMAL LOW (ref >60)
Glucose, Bld: 109 mg/dL — ABNORMAL HIGH (ref 70–99)
Potassium: 3.9 mmol/L (ref 3.5–5.1)
Sodium: 131 mmol/L — ABNORMAL LOW (ref 135–145)

## 2022-11-14 LAB — MAGNESIUM: Magnesium: 1.7 mg/dL (ref 1.7–2.4)

## 2022-11-14 MED ORDER — FUROSEMIDE 40 MG PO TABS: 40.0000 mg | ORAL_TABLET | Freq: Every day | ORAL | Status: DC

## 2022-11-14 NOTE — Progress Notes (Signed)
Report called to [facility].

## 2022-11-14 NOTE — Discharge Summary (Signed)
Physician Discharge Summary   Patient: Felicia Benson MRN: [id number] DOB: 01-19-1930  Admit date:     10/28/2022  Discharge date: 11/14/22  Discharge Physician: Meredeth Ide   PCP: [name], MD   Recommendations at discharge:   Follow-up orthopedics surgery in 2 weeks Stop taking amlodipine, metoprolol, losartan due to low blood pressure Take Lovenox for DVT prophylaxis till 11/28/2022  Discharge Diagnoses: Principal Problem:   Closed right hip fracture (HCC) Active Problems:   Chronic diastolic CHF (congestive heart failure) (HCC)   Essential hypertension   Chronic hypoxic respiratory failure (HCC)   Hyperlipidemia   Fall at home, initial encounter   History of COPD   Allergic rhinitis   Anemia of chronic disease   Ileus, postoperative (HCC)  Resolved Problems:   * No resolved hospital problems. *  Hospital Course:  89 y.o. female with past medical history significant for chronic respiratory failure/COPD on 4 L nasal cannula at baseline, chronic diastolic congestive heart failure, HTN, HLD, anemia of chronic medical disease presented with right hip and knee pain following a mechanical fall.  She was found to have acute displaced right intertrochanteric femoral fracture.  CT head/C-spine showed no acute intracranial abnormality, stable heterogeneous enlargement of right thyroid that is most likely indicative of multinodular goiter.  She underwent ORIF by orthopedics on 10/30/2022.  Subsequently, PT recommended SNF placement.  Hospital course complicated by ileus.  NG tube placed on 11/06/2022.  GI consulted.  GI signed off on 11/09/2022.  NG tube removed on 11/11/2022    Assessment and Plan:  Displaced right intertrochanteric femur fracture after a mechanical fall -underwent ORIF by orthopedics on 10/30/2022.  -Subsequently, PT recommended SNF placement.  -Wound care/pain management/activity/DVT prophylaxis as per orthopedics recommendations.  Outpatient follow-up with  orthopedics in 2 weeks   Postoperative ileus -Resolved - GI signed off on 11/09/2022.  Patient having small bowel movements.  NG tube removed on 11/11/2022.  -Patient had bowel movement last night.   Acute renal failure -Creatinine has improved to 1.42, at baseline   Acute metabolic acidosis -Improved.   Hyperkalemia -Improved   Hypokalemia -Mild.  Monitor   Hyponatremia -Possibly from poor oral intake.  Sodium 131 today.    Leukocytosis -Improving.  Monitor   Chronic respiratory failure with hypoxia/COPD -On 4 L oxygen via nasal cannula normally.  Continue Dulera, Singulair and Incruse Ellipta.  Outpatient follow-up with pulmonary   Chronic diastolic heart failure Essential hypertension -Currently compensated.  Strict input and output.  Daily weights.  Amlodipine, metoprolol and losartan has been discontinued  -Diuresed well with IV Lasix blood pressure on the lower side.   -Continue Lasix 40 mg daily   Hyperlipidemia -Continue atorvastatin   History of Crohn's disease -Continue home medications.  Outpatient follow-up with GI   Anemia of chronic disease -From chronic illnesses.  Hemoglobin stable.  Monitor intermittently.   Hypoalbuminemia -From poor oral intake. -Albumin is 1.7, continue protein supplements  Hypocalcemia -Calcium was 7.3 this morning, albumin is 1.7 -Corrected calcium 9.3   Goals of care -Patient is DNR.  Overall prognosis is guarded.  Palliative care following intermittently.          Consultants: Orthopedics, gastroenterology, palliative care Procedures performed: ORIF Disposition: Skilled nursing facility Diet recommendation:  Discharge Diet Orders (From admission, onward)     Start     Ordered   11/14/22 0000  Diet - low sodium heart healthy        11/14/22 1257  Regular diet DISCHARGE MEDICATION: Allergies as of 11/14/2022       Reactions   Ibuprofen    Other reaction(s): gi bleed   Ace Inhibitors     Other reaction(s): cough   Breztri Aerosphere [budeson-glycopyrrol-formoterol] Hypertension   Ciprofloxacin    Other Reaction(s): avoid fluoroquinolones due to asc aortic aneurysm   Levofloxacin    Other Reaction(s): aneurysm   Nsaids Nausea And Vomiting   Pt has preference to tylonel        Medication List     STOP taking these medications    amLODipine 10 MG tablet Commonly known as: NORVASC   losartan 25 MG tablet Commonly known as: COZAAR   Metoprolol Tartrate 75 MG Tabs   Vitamin D3 125 MCG (5000 UT) Tabs       TAKE these medications    acetaminophen 500 MG tablet Commonly known as: TYLENOL Take 1,000 mg by mouth daily.   albuterol 108 (90 Base) MCG/ACT inhaler Commonly known as: VENTOLIN HFA Inhale 2 puffs into the lungs every 6 (six) hours as needed for wheezing or shortness of breath.   aspirin EC 81 MG tablet Take 1 tablet (81 mg total) by mouth daily. Swallow whole.   atorvastatin 40 MG tablet Commonly known as: LIPITOR Take 40 mg by mouth every evening.   docusate sodium 100 MG capsule Commonly known as: COLACE Take 2 capsules (200 mg total) by mouth 2 (two) times daily. What changed: when to take this   enoxaparin 40 MG/0.4ML injection Commonly known as: LOVENOX Inject 0.4 mLs (40 mg total) into the skin daily for 28 days.   fluticasone 50 MCG/ACT nasal spray Commonly known as: FLONASE Place 2 sprays into both nostrils daily. What changed:  when to take this reasons to take this   furosemide 40 MG tablet Commonly known as: LASIX Take 1 tablet (40 mg total) by mouth daily. Start taking on: November 15, 2022 What changed: how much to take   melatonin 3 MG Tabs tablet Take 1 tablet (3 mg total) by mouth at bedtime. What changed:  when to take this reasons to take this   mesalamine 500 MG CR capsule Commonly known as: PENTASA Take 1,000 mg by mouth 2 (two) times daily.   mometasone-formoterol 100-5 MCG/ACT Aero Commonly known as:  DULERA Inhale 2 puffs into the lungs 2 (two) times daily.   montelukast 10 MG tablet Commonly known as: SINGULAIR Take 1 tablet (10 mg total) by mouth daily. What changed: when to take this   Nasal Moist Gel Place 1 application  into the nose 2 (two) times daily.   oxyCODONE 5 MG immediate release tablet Commonly known as: Roxicodone Take 0.5-1 tablets (2.5-5 mg total) by mouth every 4 (four) hours as needed for up to 7 days for severe pain (pain score 7-10) or moderate pain (pain score 4-6).   pantoprazole 40 MG tablet Commonly known as: PROTONIX Take 40 mg by mouth 2 (two) times daily.   polyethylene glycol 17 g packet Commonly known as: MIRALAX / GLYCOLAX Take 17 g by mouth daily as needed. What changed: reasons to take this        Contact information for follow-up providers     Joen Laura, MD Follow up in 4 week(s).   Specialty: Orthopedic Surgery Contact information: [address] [place] [address] [phone]              Contact information for after-discharge care  Destination     [facility] Preferred SNF .   Service: Skilled Nursing Contact information: [address] [place] [address] [phone]                    Discharge Exam: Ceasar Mons Weights   11/09/22 0500 11/12/22 0718 11/13/22 0500  Weight: 94.3 kg 97.1 kg 97.6 kg   General-appears in no acute distress Heart-S1-S2, regular, no murmur auscultated Lungs-clear to auscultation bilaterally, no wheezing or crackles auscultated Abdomen-soft, nontender, no organomegaly Extremities-bilateral 1+ edema in the lower extremities  Neuro-alert, oriented x3, no focal deficit noted  Condition at discharge: good  The results of significant diagnostics from this hospitalization (including imaging, microbiology, ancillary and laboratory) are listed below for reference.   Imaging Studies: DG HIP UNILAT WITH PELVIS 2-3 VIEWS  RIGHT  Result Date: 11/13/2022 CLINICAL DATA:  Postoperative state. EXAM: DG HIP (WITH OR WITHOUT PELVIS) 2-3V RIGHT COMPARISON:  Pelvis and right hip radiographs 10/30/2022 FINDINGS: Redemonstration of cephalomedullary nail fixation of the previously seen proximal right femoral intertrochanteric fracture. Normal alignment. No perihardware lucency is seen to indicate hardware failure or loosening. There is again medial displacement of the lesser trochanter. Mild-to-moderate bilateral femoroacetabular and mild pubic symphysis joint space narrowing and peripheral osteophytosis. IMPRESSION: Redemonstration of cephalomedullary nail fixation of the previously seen proximal right femoral intertrochanteric fracture. No evidence of hardware failure. Electronically Signed   By: Neita Garnet M.D.   On: 11/13/2022 10:45   DG Abd Portable 1V  Result Date: 11/11/2022 CLINICAL DATA:  Ileus EXAM: PORTABLE ABDOMEN - 1 VIEW COMPARISON:  KUB 1 day prior FINDINGS: The enteric catheter is coiled in the stomach. There is persistent and unchanged diffuse gaseous distention of the bowel throughout the abdomen. Contrast is again seen extending to the rectum. There is no definite free intraperitoneal air. Cardiac device leads are noted. There is probable bibasilar subsegmental atelectasis. There is no acute osseous abnormality. Postsurgical changes are again noted in the right femur. IMPRESSION: 1. Enteric catheter coiled in the stomach. 2. Unchanged diffuse gaseous distention of the bowel throughout the abdomen. Electronically Signed   By: Lesia Hausen M.D.   On: 11/11/2022 10:15   DG Abd Portable 1V  Result Date: 11/10/2022 CLINICAL DATA:  Ileus EXAM: PORTABLE ABDOMEN - 1 VIEW COMPARISON:  11/09/2022 FINDINGS: Enteric tube has been advanced with distal tip and side port now projecting within the stomach. Similar abnormal distension of small bowel loops throughout the abdomen. Enteric contrast pattern has not significantly  changed compared to the previous study with contrast noted in the region of the splenic flexure and within the rectum. No gross free intraperitoneal air on supine imaging. IMPRESSION: 1. Enteric tube has been advanced with distal tip and side port now projecting within the stomach. 2. Similar abnormal distension of small bowel loops throughout the abdomen. Electronically Signed   By: Duanne Guess D.O.   On: 11/10/2022 12:33   DG CHEST PORT 1 VIEW  Result Date: 11/09/2022 CLINICAL DATA:  Dyspnea, productive cough. EXAM: PORTABLE CHEST 1 VIEW COMPARISON:  November 02, 2022. FINDINGS: Stable cardiomediastinal silhouette. Endotracheal tube is seen entering stomach. Left-sided pacemaker is unchanged. Stable bibasilar opacities are noted with probable small left pleural effusion. Bony thorax is unremarkable. IMPRESSION: Stable bibasilar opacities with probable small left pleural effusion. Endotracheal tube is seen entering stomach. Electronically Signed   By: Lupita Raider M.D.   On: 11/09/2022 16:12   DG Abd Portable 1V  Result  Date: 11/09/2022 CLINICAL DATA:  [id number] Ileus (HCC) [phone] EXAM: PORTABLE ABDOMEN - 1 VIEW COMPARISON:  Abdominal radiograph 11/08/2022 FINDINGS: Redemonstrated diffusely dilated loops of small bowel with enteric contrast seen of the splenic flexure, hepatic flexure, and in the rectum. No supine evidence of pneumoperitoneum. There has been interval repositioning of the enteric tube which courses retrograde and likely terminates in the distal esophagus. Recommend further evaluation with dedicated chest radiograph. IMPRESSION: 1. Interval repositioning of the enteric tube which courses retrograde and likely terminates in the distal esophagus. Recommend further evaluation with dedicated chest radiograph. 2. Redemonstrated diffusely dilated loops of small bowel, unchanged from recent prior abdominal radiograph These results will be called to the ordering clinician or representative by the  Radiologist Assistant, and communication documented in the PACS or [employer]. Electronically Signed   By: Lorenza Cambridge M.D.   On: 11/09/2022 11:18   DG Abd Portable 1V  Result Date: 11/08/2022 CLINICAL DATA:  89 year old female with recent hip surgery, abdominal pain. CT suggestive of postoperative ileus. EXAM: PORTABLE ABDOMEN - 1 VIEW COMPARISON:  Abdominal radiographs yesterday, CT Abdomen and Pelvis 11/03/2022. FINDINGS: Portable AP supine view at 0641 hours. Enteric tube remains in place but has been pulled back, side hole now projects just above the central diaphragm likely just upstream of the GEJ. Lung bases appear stable. Cardiac pacemaker leads. Bowel-gas pattern not significantly changed since 11/03/2022. Oral contrast at that time has cleared the small bowel, present in the right colon and the rectum, with mild additional clearance since yesterday. Large bowel diverticulosis again noted. Aortoiliac calcified atherosclerosis. Stable visualized osseous structures. Right femur ORIF. IMPRESSION: 1. Enteric tube has been pulled back, side hole now projects proximal to the GEJ. Recommend advancing the tube 6 cm to ensure side hole placement within the stomach. 2. Bowel-gas pattern not significantly changed since 11/03/2022. But transit of oral contrast from the small to the large bowel since 11/03/2022, and mild large bowel clearance of contrast since yesterday. Electronically Signed   By: Odessa Fleming M.D.   On: 11/08/2022 11:28   DG Abd Portable 1V-Small Bowel Obstruction Protocol-initial, 8 hr delay  Result Date: 11/07/2022 CLINICAL DATA:  8 hour small-bowel protocol EXAM: PORTABLE ABDOMEN - 1 VIEW COMPARISON:  11/06/2022 FINDINGS: Persistent gaseous dilatation of the bowel without significant change. Enteral contrast is present in the colon and rectum. Esophageal tube tip in the left upper quadrant IMPRESSION: Persistent gaseous dilatation of the bowel without significant change. Enteral  contrast is present in the colon and rectum. Electronically Signed   By: Jasmine Pang M.D.   On: 11/07/2022 04:18   DG Abd Portable 1V-Small Bowel Protocol-Position Verification  Result Date: 11/06/2022 CLINICAL DATA:  NG tube placement. EXAM: PORTABLE ABDOMEN - 1 VIEW COMPARISON:  Radiograph yesterday, CT 11/03/2022 FINDINGS: Tip and side port of the enteric tube below the diaphragm in the stomach. Gaseous bowel distention is similar to yesterday's exam. IMPRESSION: Tip and side port of the enteric tube below the diaphragm in the stomach. Electronically Signed   By: Narda Rutherford M.D.   On: 11/06/2022 14:38   DG Abd Portable 1V  Result Date: 11/05/2022 CLINICAL DATA:  [id number] Ileus, postoperative (HCC) [id number] EXAM: PORTABLE ABDOMEN - 1 VIEW COMPARISON:  Abdominal radiograph 11/03/22 FINDINGS: Redemonstrated diffusely dilated loops of small bowel, not significantly changed in size from prior exam. No pneumatosis. No supine evidence of pneumoperitoneum. Partially imaged intramedullary screws along the right femur. Bibasilar atelectasis. IMPRESSION: Redemonstrated diffusely dilated loops of small bowel,  not significantly changed in size from prior exam. Electronically Signed   By: Lorenza Cambridge M.D.   On: 11/05/2022 10:38   CT ABDOMEN PELVIS W CONTRAST  Result Date: 11/03/2022 CLINICAL DATA:  Hip surgery 5 days ago, nausea and vomiting, pain EXAM: CT ABDOMEN AND PELVIS WITH CONTRAST TECHNIQUE: Multidetector CT imaging of the abdomen and pelvis was performed using the standard protocol following bolus administration of intravenous contrast. RADIATION DOSE REDUCTION: This exam was performed according to the departmental dose-optimization program which includes automated exposure control, adjustment of the mA and/or kV according to patient size and/or use of iterative reconstruction technique. CONTRAST:  60mL OMNIPAQUE IOHEXOL 350 MG/ML SOLN COMPARISON:  11/03/2022, 01/13/2022 FINDINGS: Lower chest:  Trace left pleural effusion. Scattered areas of dependent consolidation within the lower lobes, left greater than right, favor atelectasis. Stable cardiomegaly without pericardial effusion. Hepatobiliary: No focal liver abnormality is seen. Status post cholecystectomy. No biliary dilatation. Pancreas: Stable cystic areas within the pancreatic body and tail, measuring up to 1.5 cm. No specific imaging follow-up is recommended given size and long-term stability. No acute inflammatory changes or pancreatic duct dilation. Spleen: Normal in size without focal abnormality. Adrenals/Urinary Tract: The kidneys enhance normally and symmetrically. Punctate less than 2 mm nonobstructing left renal calculus. No obstructive uropathy within either kidney. The bladder is minimally distended, without gross abnormality. The right adrenal is unremarkable. Stable 1.9 cm indeterminate left adrenal nodule, measuring 93 HU, likely adenoma based on lack of significant change since 10/18/2020. Stomach/Bowel: There is no evidence of high-grade bowel obstruction. Oral contrast administered for the exam has progressed into the colon by the time of imaging. Colon is moderately distended with scattered gas fluid levels, which may reflect postoperative ileus given recent orthopedic surgery. Diverticulosis throughout the colon, most pronounced within the sigmoid, without evidence of acute diverticulitis. No bowel wall thickening or inflammatory change. Small hiatal hernia. Vascular/Lymphatic: Aortic atherosclerosis. No enlarged abdominal or pelvic lymph nodes. Reproductive: Status post hysterectomy. No adnexal masses. Other: No free fluid or free intraperitoneal gas. No abdominal wall hernia. Musculoskeletal: Postsurgical changes from recent right hip ORIF spanning an intertrochanteric right hip fracture, with extensive postsurgical changes in the overlying soft tissues. No other acute bony abnormalities. Reconstructed images demonstrate no  additional findings. IMPRESSION: 1. Moderate colonic distension which may reflect postoperative ileus given recent orthopedic surgery. No evidence of high-grade bowel obstruction, as oral contrast has progressed into the colon by the time of imaging. 2. Colonic diverticulosis without evidence of acute diverticulitis. 3. Stable indeterminate lesions within the pancreas and left adrenal gland as above, likely benign given long-term stability. No specific imaging follow-up is required. 4.  Aortic Atherosclerosis (ICD10-I70.0). 5. Postsurgical changes from recent ORIF of an intertrochanteric right hip fracture. No evidence of acute complication. Electronically Signed   By: Sharlet Salina M.D.   On: 11/03/2022 20:58   DG Abd 1 View  Result Date: 11/03/2022 CLINICAL DATA:  [id number] Abdominal pain [id number] EXAM: ABDOMEN - 1 VIEW COMPARISON:  November 02, 2022, January 13, 2022 FINDINGS: There is gaseous dilation of loops of large and small bowel. Some rectal air is visualized. Status post RIGHT femoral intramedullary rod fixation. IMPRESSION: There is gaseous dilation of loops of large and small bowel. Findings are favored to reflect ileus in a postsurgical status. If clinical concern for acute obstruction, recommend dedicated CT. Electronically Signed   By: Meda Klinefelter M.D.   On: 11/03/2022 13:38   DG CHEST PORT 1 VIEW  Result Date: 11/02/2022 CLINICAL  DATA:  Shortness of breath. EXAM: PORTABLE CHEST 1 VIEW COMPARISON:  10/28/2022. FINDINGS: Bilateral lungs appear hyperlucent with coarse bronchovascular markings, concerning for underlying COPD. There are slightly increased interstitial markings when compared to the prior exam, concerning for mild underlying pulmonary edema. There are atelectatic changes/scarring at the lung bases. Subtle blunting of left lateral costophrenic angle is similar to the prior study and may represent trace pleural effusion versus overlying soft tissue. Bilateral lungs otherwise  appear clear. Stable cardio-mediastinal silhouette. There is a left sided 2-lead pacemaker. No acute osseous abnormalities. Partially seen lower cervical spinal fixation hardware. There are surgical staples along the left inferomedial neck region. The soft tissues are within normal limits. IMPRESSION: *Findings concerning for COPD. There is probable superimposed mild pulmonary edema. *Blunting of left lateral costophrenic angle is similar to the prior study and may represent trace pleural effusion versus overlying soft tissue. Electronically Signed   By: Jules Schick M.D.   On: 11/02/2022 08:19   DG HIP UNILAT W OR W/O PELVIS 2-3 VIEWS RIGHT  Result Date: 10/30/2022 CLINICAL DATA:  Postoperative state.  Right hip fracture. EXAM: DG HIP (WITH OR WITHOUT PELVIS) 2-3V RIGHT COMPARISON:  10/28/2022. FINDINGS: There is redemonstration of a comminuted intertrochanteric fracture of the right hip with interval placement of fixation hardware. Alignment appears anatomic. Degenerative changes are noted at the hips bilaterally and in the lower lumbar spine. Postsurgical air is noted in the soft tissues lateral to the right hip. IMPRESSION: Status post open reduction internal fixation of right intertrochanteric fracture. Electronically Signed   By: Thornell Sartorius M.D.   On: 10/30/2022 23:19   DG FEMUR, MIN 2 VIEWS RIGHT  Result Date: 10/30/2022 CLINICAL DATA:  Elective surgery EXAM: RIGHT FEMUR 2 VIEWS COMPARISON:  Right hip x-ray 10/28/2022 FINDINGS: Eight intraoperative fluoroscopic views of the right hip. There is a new right femoral intramedullary nail and hip screw fixating intratrochanteric fracture. Alignment is anatomic. Fluoroscopy time: 1 minute 9 seconds. Fluoroscopy dose: 14.86 micro gray. IMPRESSION: Intraoperative fluoroscopic views of the right hip. Electronically Signed   By: Darliss Cheney M.D.   On: 10/30/2022 22:08   DG C-Arm 1-60 Min-No Report  Result Date: 10/30/2022 Fluoroscopy was utilized  by the requesting physician.  No radiographic interpretation.   CT Cervical Spine Wo Contrast  Result Date: 10/28/2022 CLINICAL DATA:  Poly trauma, blunt.  Trip and fall injury. EXAM: CT CERVICAL SPINE WITHOUT CONTRAST TECHNIQUE: Multidetector CT imaging of the cervical spine was performed without intravenous contrast. Multiplanar CT image reconstructions were also generated. RADIATION DOSE REDUCTION: This exam was performed according to the departmental dose-optimization program which includes automated exposure control, adjustment of the mA and/or kV according to patient size and/or use of iterative reconstruction technique. COMPARISON:  CTA neck 12/31/2020 FINDINGS: Alignment: Normal alignment. Skull base and vertebrae: No acute fracture. No primary bone lesion or focal pathologic process. Soft tissues and spinal canal: No prevertebral fluid or swelling. No visible canal hematoma. Disc levels: Postoperative anterior plate and screw fixation and intervertebral fusion from C3 through C5. Fused segments appears intact. Surgical hardware appears intact. Degenerative changes at the other levels with disc space narrowing and endplate osteophyte formation. Degenerative changes in the facet joints. Upper chest: Mild interstitial changes in the lung apices, possibly edema. Surgical absence of the left thyroid gland. Diffuse heterogeneous enlargement of the right thyroid and isthmus. No change since previous study. Appearance is likely indicate nodular goiter. Other: None. IMPRESSION: 1. Normal alignment.  No acute displaced fractures  identified. 2. Postoperative anterior fixation and intervertebral fusion from C3 through C5. Fused segments appear intact. 3. Mild interstitial changes in the lung apices may indicate edema. 4. Stable heterogeneous enlargement of the right thyroid and isthmus likely indicating nodular goiter. This has been evaluated on previous imaging. (ref: J Am Coll Radiol. 2015 Feb;12(2): 143-50).  Electronically Signed   By: Burman Nieves M.D.   On: 10/28/2022 21:18   CT HEAD WO CONTRAST ( )  Result Date: 10/28/2022 CLINICAL DATA:  Poly trauma, blunt. Trip and fall injury during physical therapy. No loss of consciousness. EXAM: CT HEAD WITHOUT CONTRAST TECHNIQUE: Contiguous axial images were obtained from the base of the skull through the vertex without intravenous contrast. RADIATION DOSE REDUCTION: This exam was performed according to the departmental dose-optimization program which includes automated exposure control, adjustment of the mA and/or kV according to patient size and/or use of iterative reconstruction technique. COMPARISON:  MRI brain 01/11/2021.  CT head 12/31/2020 FINDINGS: Brain: Mild diffuse cerebral atrophy. Ventricular dilatation consistent with central atrophy. Low-attenuation changes in the deep white matter consistent with small vessel ischemia. No mass-effect or midline shift. No abnormal extra-axial fluid collections. Gray-white matter junctions are distinct. Basal cisterns are not effaced. No acute intracranial hemorrhage. There is an extra-axial soft tissue lesion in the left cavernous sinus and adjacent to the anterior margin of the left pons. This measures about 7 x 10 mm. No significant change in appearance since prior studies. Most likely a small meningioma or possibly neurogenic tumor. Vascular: No hyperdense vessel or unexpected calcification. Skull: Normal. Negative for fracture or focal lesion. Sinuses/Orbits: No acute finding. Other: None. IMPRESSION: 1. No acute intracranial abnormalities. 2. Chronic atrophy and small vessel ischemic changes. 3. Extra-axial soft tissue lesion in the left cavernous sinus region, unchanged since prior studies. This is most likely a small meningioma or possibly neurogenic tumor. Electronically Signed   By: Burman Nieves M.D.   On: 10/28/2022 21:13   DG Knee Complete 4 Views Right  Result Date: 10/28/2022 CLINICAL DATA:  Pain  after a fall. EXAM: RIGHT KNEE - COMPLETE 4+ VIEW COMPARISON:  MRI 05/01/2021 FINDINGS: Degenerative changes in the right knee with medial and lateral compartment narrowing and tricompartment osteophyte formation. No evidence of acute fracture or dislocation. No focal bone lesion or bone destruction. No significant effusions. Vascular calcifications. Soft tissue swelling over the infrapatellar region likely representing contusion. IMPRESSION: 1. No acute fracture or dislocation. 2. Moderate tricompartment degenerative changes. 3. Soft tissue swelling inferior to the patella likely representing soft tissue contusion. No effusion. Electronically Signed   By: Burman Nieves M.D.   On: 10/28/2022 21:06   DG Forearm Left  Result Date: 10/28/2022 CLINICAL DATA:  Pain after a fall. EXAM: LEFT FOREARM - 2 VIEW COMPARISON:  None Available. FINDINGS: Degenerative changes demonstrated in the radiocarpal and STT joints of the left wrist. Calcification of the triangular fibrocartilage. Radius and ulna appear intact. No acute displaced fractures identified. Soft tissues are unremarkable. IMPRESSION: Degenerative changes in the carpal bones. No acute bony abnormalities. Electronically Signed   By: Burman Nieves M.D.   On: 10/28/2022 21:05   DG Chest Portable 1 View  Result Date: 10/28/2022 CLINICAL DATA:  Larey Seat during physical therapy. Pain to the right hip and left forearm. EXAM: PORTABLE CHEST 1 VIEW COMPARISON:  07/21/2022 FINDINGS: Cardiac pacemaker. Borderline heart size with normal pulmonary vascularity. Emphysematous changes and interstitial changes in the lungs likely indicating chronic bronchitis. Pleural effusions and consolidation seen previously have resolved.  No pneumothorax. Mediastinal contours appear intact. Calcification of the aorta. Degenerative changes in the spine and shoulders. Postoperative changes in the cervical spine. IMPRESSION: Emphysematous and chronic bronchitic changes in the lungs. No  focal consolidation. Electronically Signed   By: Burman Nieves M.D.   On: 10/28/2022 21:04   DG Hip Unilat With Pelvis 2-3 Views Right  Result Date: 10/28/2022 CLINICAL DATA:  pain EXAM: DG HIP (WITH OR WITHOUT PELVIS) 2-3V RIGHT COMPARISON:  None Available. FINDINGS: Acute displaced right intertrochanteric femoral fracture. No right hip dislocation. No acute displaced fracture or dislocation of the left hip on frontal view. No acute displaced fracture or diastasis of the bones of the pelvis. At least mild right and moderate left hip degenerative changes. No aggressive appearing focal bone abnormality. Visualized lower lumbar spine demonstrates degenerative changes. Atherosclerotic plaque. IMPRESSION: Acute displaced right intertrochanteric femoral fracture. Electronically Signed   By: Tish Frederickson M.D.   On: 10/28/2022 21:03    Microbiology: Results for orders placed or performed during the hospital encounter of 10/28/22  Surgical pcr screen     Status: None   Collection Time: 10/30/22 12:42 PM   Specimen: Nasal Mucosa; Nasal Swab  Result Value Ref Range Status   MRSA, PCR NEGATIVE NEGATIVE Final   Staphylococcus aureus NEGATIVE NEGATIVE Final    Comment: (NOTE) The Xpert SA Assay (FDA approved for NASAL specimens in patients 71 years of age and older), is one component of a comprehensive surveillance program. It is not intended to diagnose infection nor to guide or monitor treatment. Performed at [facility], [address] [place], [place] [address]     Labs: CBC: Recent Labs  Lab 11/10/22 0259 11/11/22 0315 11/12/22 0257 11/13/22 0249 11/14/22 0316  WBC 11.9* 13.9* 14.0* 11.7* 8.7  NEUTROABS 9.8* 11.9* 12.3* 9.9* 7.0  HGB 8.9* 8.7* 9.3* 8.5* 8.1*  HCT 28.6* 28.2* 30.0* 27.3* 25.6*  MCV 92.0 93.1 92.9 93.5 93.4  PLT 416* 390 382 319 304   Basic Metabolic Panel: Recent Labs  Lab 11/10/22 0259 11/11/22 0315 11/12/22 0257 11/13/22 0249 11/14/22 0316  NA  130* 133* 134* 131* 131*  K 5.3* 4.4 4.5 3.3* 3.9  CL 99 100 100 99 98  CO2 19* 21* 22 26 27   GLUCOSE 67* 75 96 150* 109*  BUN 60* 57* 57* 56* 58*  CREATININE 1.59* 1.60* 1.63* 1.71* 1.43*  CALCIUM 8.1* 8.0* 8.1* 7.6* 7.3*  MG 2.4 2.4 2.2 2.1 1.7   Liver Function Tests: Recent Labs  Lab 11/13/22 0249  AST 23  ALT 12  ALKPHOS 82  BILITOT 1.0  PROT 4.6*  ALBUMIN 1.7*   CBG: No results for input(s): "GLUCAP" in the last 168 hours.  Discharge time spent: greater than 30 minutes.  Signed: Meredeth Ide, MD [facility] 11/14/2022

## 2022-11-14 NOTE — Plan of Care (Signed)

## 2022-11-14 NOTE — Progress Notes (Signed)
Physical Therapy Treatment Patient Details Name: Felicia Benson MRN: [id number] DOB: [birth year] Today's Date: 11/14/2022   History of Present Illness Pt is a 89 y/o female presenting on 10/20 after fall.  Found with R hip fx, s/p IM nail 10/22.  PMH includes: chronic hypoxic respiratory failure on baseline and 4 L continuous nasal cannula, COPD, chronic diastolic heart failure, essential pretension, hyperlipidemia anemia of chronic disease is a baseline hemoglobin range 10-12.    PT Comments  Pt received in supine and requesting to transfer to the recliner. Pt able to sit to EOB with max A and demonstrates increased difficulty advancing RLE compared to LLE. Pt requires max A +2 to stand to the stedy from elevated EOB due to BLE weakness. Pt demonstrates very quick fatigue and inability to reach an upright stance during second standing trial. Pt continues to benefit from PT services to progress toward functional mobility goals.    If plan is discharge home, recommend the following: Two people to help with walking and/or transfers;Assist for transportation;Two people to help with bathing/dressing/bathroom   Can travel by private vehicle     No  Equipment Recommendations  Wheelchair (measurements PT);Wheelchair cushion (measurements PT);Hospital bed;Hoyer lift    Recommendations for Other Services       Precautions / Restrictions Precautions Precautions: Fall;Other (comment) Precaution Comments: BPs soft, oxygen 4L at baseline Restrictions Weight Bearing Restrictions: Yes RLE Weight Bearing: Weight bearing as tolerated     Mobility  Bed Mobility Overal bed mobility: Needs Assistance Bed Mobility: Supine to Sit     Supine to sit: Max assist     General bed mobility comments: Assist to offload BLE with pt able to advance to EOB, trunk elevation, and scooting forward using bedpad    Transfers Overall transfer level: Needs assistance Equipment used: Ambulation equipment  used Transfers: Sit to/from Stand, Bed to chair/wheelchair/BSC Sit to Stand: Max assist, From elevated surface, Via lift equipment, +2 physical assistance           General transfer comment: Max A +2 to stand to the stedy and from stedy paddles. limited bottom clearance from stedy paddles due to increased fatigue Transfer via Lift Equipment: Stedy  Ambulation/Gait               General Gait Details: unable       Balance Overall balance assessment: Needs assistance Sitting-balance support: Bilateral upper extremity supported, Feet supported Sitting balance-Leahy Scale: Fair Sitting balance - Comments: sitting EOB   Standing balance support: Bilateral upper extremity supported, During functional activity, Reliant on assistive device for balance Standing balance-Leahy Scale: Zero Standing balance comment: Reliant on assist to remain standing                            Cognition Arousal: Alert Behavior During Therapy: WFL for tasks assessed/performed Overall Cognitive Status: Within Functional Limits for tasks assessed                                          Exercises      General Comments        Pertinent Vitals/Pain Pain Assessment Pain Assessment: Faces Faces Pain Scale: Hurts little more Pain Location: BLE with WB Pain Descriptors / Indicators: Discomfort, Grimacing Pain Intervention(s): Monitored during session, Limited activity within patient's tolerance, Repositioned     PT Goals (  current goals can now be found in the care plan section) Acute Rehab PT Goals Patient Stated Goal: pain control, to walk PT Goal Formulation: With patient/family Time For Goal Achievement: 11/14/22 Progress towards PT goals: Progressing toward goals    Frequency    Min 1X/week       AM-PAC PT "6 Clicks" Mobility   Outcome Measure  Help needed turning from your back to your side while in a flat bed without using bedrails?: A  Lot Help needed moving from lying on your back to sitting on the side of a flat bed without using bedrails?: A Lot Help needed moving to and from a bed to a chair (including a wheelchair)?: Total Help needed standing up from a chair using your arms (e.g., wheelchair or bedside chair)?: Total Help needed to walk in hospital room?: Total Help needed climbing 3-5 steps with a railing? : Total 6 Click Score: 8    End of Session Equipment Utilized During Treatment: Gait belt;Oxygen Activity Tolerance: Patient limited by fatigue;Patient tolerated treatment well Patient left: in chair;with chair alarm set;with call bell/phone within reach Nurse Communication: Mobility status PT Visit Diagnosis: History of falling (Z91.81);Muscle weakness (generalized) (M62.81);Difficulty in walking, not elsewhere classified (R26.2);Other abnormalities of gait and mobility (R26.89)     Time: 4098-1191 PT Time Calculation (min) (ACUTE ONLY): 26 min  Charges:    $Therapeutic Activity: 23-37 mins PT General Charges $$ ACUTE PT VISIT: 1 Visit                     Johny Shock, PTA Acute Rehabilitation Services Secure Chat Preferred  [phone]    Johny Shock 11/14/2022, 11:43 AM

## 2022-11-14 NOTE — Consult Note (Signed)
WOC Nurse Consult Note: Reason for Consult: Stage 2 to the sacrum  Wound type: Stage 2 sacrum Pressure Injury POA: No Measurement: see nursing flow sheets Wound bed: clean, pink Drainage (amount, consistency, odor) none Periwound: intact  Dressing procedure/placement/frequency: Cleanse sacral wound with saline, pat dry Silicone foam dressings to the sacral wound,  change every 3 days. ASSESS UNDER dressings each shift for any acute changes in the wounds.   Low air loss mattress in high risk patient   Re consult if needed, will not follow at this time. Thanks  Delle Andrzejewski [employer], RN,CWOCN, CNS, CWON-AP [phone])

## 2022-11-14 NOTE — TOC Transition Note (Signed)
Transition of Care [facility]) - CM/SW Discharge Note   Patient Details  Name: Felicia Benson MRN: [id number] Date of Birth: August 22, 1930  Transition of [facility]) CM/SW Contact:  Lorri Frederick, LCSW Phone Number: 11/14/2022, 1:41 PM   Clinical Narrative:   Pt discharging to [facility], room 513.  RN call report to [phone].   1100: CSW confirmed with Nikki/[facility] that they can receive pt today.   Final next level of care: Skilled Nursing Facility Barriers to Discharge: Barriers Resolved   Patient Goals and CMS Choice CMS Medicare.gov Compare Post Acute Care list provided to:: Patient Choice offered to / list presented to : Patient  Discharge Placement                Patient chooses bed at: [facility] Patient to be transferred to facility by: ptar Name of family member notified: daugher [name] Patient and family notified of of transfer: 11/14/22  Discharge Plan and Services Additional resources added to the After Visit Summary for   In-house Referral: Clinical Social Work   Post Acute Care Choice: Skilled Nursing Facility                               Social Determinants of Health (SDOH) Interventions SDOH Screenings   Food Insecurity: No Food Insecurity (10/29/2022)  Housing: Low Risk  (10/29/2022)  Transportation Needs: No Transportation Needs (10/29/2022)  Utilities: Not At Risk (11/10/2022)  Tobacco Use: Medium Risk (10/30/2022)     Readmission Risk Interventions    07/24/2022    2:49 PM  Readmission Risk Prevention Plan  Transportation Screening Complete  PCP or Specialist Appt within 5-7 Days Complete  Home Care Screening Complete  Medication Review (RN CM) Complete

## 2022-12-28 ENCOUNTER — Emergency Department (HOSPITAL_COMMUNITY): Payer: Medicare Other

## 2022-12-28 ENCOUNTER — Other Ambulatory Visit: Payer: Self-pay

## 2022-12-28 ENCOUNTER — Encounter (HOSPITAL_COMMUNITY): Payer: Self-pay

## 2022-12-28 ENCOUNTER — Inpatient Hospital Stay (HOSPITAL_COMMUNITY)
Admission: EM | Admit: 2022-12-28 | Discharge: 2023-01-03 | DRG: 378 | Disposition: A | Discharge status: Skilled Nursing Facility | Payer: Medicare Other | Source: Skilled Nursing Facility | Attending: Internal Medicine | Admitting: Internal Medicine

## 2022-12-28 DIAGNOSIS — K5090 Crohn's disease, unspecified, without complications: Secondary | ICD-10-CM | POA: Diagnosis present

## 2022-12-28 DIAGNOSIS — I130 Hypertensive heart and chronic kidney disease with heart failure and stage 1 through stage 4 chronic kidney disease, or unspecified chronic kidney disease: Secondary | ICD-10-CM | POA: Diagnosis present

## 2022-12-28 DIAGNOSIS — S72001A Fracture of unspecified part of neck of right femur, initial encounter for closed fracture: Secondary | ICD-10-CM | POA: Diagnosis present

## 2022-12-28 DIAGNOSIS — N1832 Chronic kidney disease, stage 3b: Secondary | ICD-10-CM | POA: Diagnosis present

## 2022-12-28 DIAGNOSIS — J441 Chronic obstructive pulmonary disease with (acute) exacerbation: Secondary | ICD-10-CM | POA: Diagnosis present

## 2022-12-28 DIAGNOSIS — Z955 Presence of coronary angioplasty implant and graft: Secondary | ICD-10-CM

## 2022-12-28 DIAGNOSIS — K922 Gastrointestinal hemorrhage, unspecified: Secondary | ICD-10-CM | POA: Diagnosis present

## 2022-12-28 DIAGNOSIS — K5731 Diverticulosis of large intestine without perforation or abscess with bleeding: Principal | ICD-10-CM | POA: Diagnosis present

## 2022-12-28 DIAGNOSIS — E785 Hyperlipidemia, unspecified: Secondary | ICD-10-CM | POA: Diagnosis present

## 2022-12-28 DIAGNOSIS — Z7951 Long term (current) use of inhaled steroids: Secondary | ICD-10-CM

## 2022-12-28 DIAGNOSIS — I2510 Atherosclerotic heart disease of native coronary artery without angina pectoris: Secondary | ICD-10-CM | POA: Diagnosis present

## 2022-12-28 DIAGNOSIS — K219 Gastro-esophageal reflux disease without esophagitis: Secondary | ICD-10-CM | POA: Diagnosis present

## 2022-12-28 DIAGNOSIS — Z9981 Dependence on supplemental oxygen: Secondary | ICD-10-CM

## 2022-12-28 DIAGNOSIS — J449 Chronic obstructive pulmonary disease, unspecified: Secondary | ICD-10-CM | POA: Diagnosis present

## 2022-12-28 DIAGNOSIS — Z886 Allergy status to analgesic agent status: Secondary | ICD-10-CM

## 2022-12-28 DIAGNOSIS — N179 Acute kidney failure, unspecified: Secondary | ICD-10-CM | POA: Diagnosis present

## 2022-12-28 DIAGNOSIS — Z9049 Acquired absence of other specified parts of digestive tract: Secondary | ICD-10-CM

## 2022-12-28 DIAGNOSIS — Z801 Family history of malignant neoplasm of trachea, bronchus and lung: Secondary | ICD-10-CM

## 2022-12-28 DIAGNOSIS — R000 Tachycardia, unspecified: Secondary | ICD-10-CM | POA: Diagnosis not present

## 2022-12-28 DIAGNOSIS — I5033 Acute on chronic diastolic (congestive) heart failure: Secondary | ICD-10-CM | POA: Diagnosis present

## 2022-12-28 DIAGNOSIS — Z66 Do not resuscitate: Secondary | ICD-10-CM | POA: Diagnosis present

## 2022-12-28 DIAGNOSIS — S72001D Fracture of unspecified part of neck of right femur, subsequent encounter for closed fracture with routine healing: Secondary | ICD-10-CM

## 2022-12-28 DIAGNOSIS — Z881 Allergy status to other antibiotic agents status: Secondary | ICD-10-CM | POA: Diagnosis not present

## 2022-12-28 DIAGNOSIS — D62 Acute posthemorrhagic anemia: Secondary | ICD-10-CM | POA: Diagnosis present

## 2022-12-28 DIAGNOSIS — J9611 Chronic respiratory failure with hypoxia: Secondary | ICD-10-CM | POA: Diagnosis present

## 2022-12-28 DIAGNOSIS — E876 Hypokalemia: Secondary | ICD-10-CM | POA: Diagnosis not present

## 2022-12-28 DIAGNOSIS — Z7982 Long term (current) use of aspirin: Secondary | ICD-10-CM

## 2022-12-28 DIAGNOSIS — Z950 Presence of cardiac pacemaker: Secondary | ICD-10-CM

## 2022-12-28 DIAGNOSIS — Z79899 Other long term (current) drug therapy: Secondary | ICD-10-CM

## 2022-12-28 DIAGNOSIS — Z8709 Personal history of other diseases of the respiratory system: Secondary | ICD-10-CM

## 2022-12-28 DIAGNOSIS — N1831 Chronic kidney disease, stage 3a: Secondary | ICD-10-CM | POA: Diagnosis present

## 2022-12-28 DIAGNOSIS — Z888 Allergy status to other drugs, medicaments and biological substances status: Secondary | ICD-10-CM

## 2022-12-28 DIAGNOSIS — I5032 Chronic diastolic (congestive) heart failure: Secondary | ICD-10-CM | POA: Diagnosis present

## 2022-12-28 DIAGNOSIS — Z8679 Personal history of other diseases of the circulatory system: Secondary | ICD-10-CM

## 2022-12-28 DIAGNOSIS — G4733 Obstructive sleep apnea (adult) (pediatric): Secondary | ICD-10-CM | POA: Diagnosis present

## 2022-12-28 DIAGNOSIS — Z823 Family history of stroke: Secondary | ICD-10-CM

## 2022-12-28 DIAGNOSIS — Z87891 Personal history of nicotine dependence: Secondary | ICD-10-CM

## 2022-12-28 LAB — CBC WITH DIFFERENTIAL/PLATELET
Abs Immature Granulocytes: 0.02 10*3/uL (ref 0.00–0.07)
Basophils Absolute: 0 10*3/uL (ref 0.0–0.1)
Basophils Relative: 0 %
Eosinophils Absolute: 0.1 10*3/uL (ref 0.0–0.5)
Eosinophils Relative: 1 %
HCT: 28.1 % — ABNORMAL LOW (ref 36.0–46.0)
Hemoglobin: 8.8 g/dL — ABNORMAL LOW (ref 12.0–15.0)
Immature Granulocytes: 0 %
Lymphocytes Relative: 19 %
Lymphs Abs: 1.4 10*3/uL (ref 0.7–4.0)
MCH: 31.1 pg (ref 26.0–34.0)
MCHC: 31.3 g/dL (ref 30.0–36.0)
MCV: 99.3 fL (ref 80.0–100.0)
Monocytes Absolute: 0.8 10*3/uL (ref 0.1–1.0)
Monocytes Relative: 11 %
Neutro Abs: 5.3 10*3/uL (ref 1.7–7.7)
Neutrophils Relative %: 69 %
Platelets: 234 10*3/uL (ref 150–400)
RBC: 2.83 MIL/uL — ABNORMAL LOW (ref 3.87–5.11)
RDW: 18.5 % — ABNORMAL HIGH (ref 11.5–15.5)
WBC: 7.7 10*3/uL (ref 4.0–10.5)
nRBC: 0 % (ref 0.0–0.2)

## 2022-12-28 LAB — COMPREHENSIVE METABOLIC PANEL
ALT: 9 U/L (ref 0–44)
AST: 17 U/L (ref 15–41)
Albumin: 2.6 g/dL — ABNORMAL LOW (ref 3.5–5.0)
Alkaline Phosphatase: 75 U/L (ref 38–126)
Anion gap: 7 (ref 5–15)
BUN: 27 mg/dL — ABNORMAL HIGH (ref 8–23)
CO2: 31 mmol/L (ref 22–32)
Calcium: 7.7 mg/dL — ABNORMAL LOW (ref 8.9–10.3)
Chloride: 99 mmol/L (ref 98–111)
Creatinine, Ser: 0.97 mg/dL (ref 0.44–1.00)
GFR, Estimated: 56 mL/min — ABNORMAL LOW (ref >60)
Glucose, Bld: 98 mg/dL (ref 70–99)
Potassium: 4.6 mmol/L (ref 3.5–5.1)
Sodium: 137 mmol/L (ref 135–145)
Total Bilirubin: 0.8 mg/dL (ref <1.2)
Total Protein: 6 g/dL — ABNORMAL LOW (ref 6.5–8.1)

## 2022-12-28 LAB — POC OCCULT BLOOD, ED: Fecal Occult Bld: POSITIVE — AB

## 2022-12-28 LAB — LIPASE, BLOOD: Lipase: 39 U/L (ref 11–51)

## 2022-12-28 MED ORDER — OXYCODONE HCL 5 MG PO TABS: 5.0000 mg | ORAL_TABLET | ORAL | Status: DC | PRN

## 2022-12-28 MED ORDER — MOMETASONE FURO-FORMOTEROL FUM 100-5 MCG/ACT IN AERO
2.0000 | INHALATION_SPRAY | Freq: Two times a day (BID) | RESPIRATORY_TRACT | Status: DC
  Administered 2022-12-28 – 2023-01-03 (×12): 2 via RESPIRATORY_TRACT
  Filled 2022-12-28: qty 8.8

## 2022-12-28 MED ORDER — FUROSEMIDE 40 MG PO TABS
60.0000 mg | ORAL_TABLET | Freq: Every morning | ORAL | Status: DC
  Administered 2022-12-29: 60 mg via ORAL
  Filled 2022-12-28: qty 1

## 2022-12-28 MED ORDER — SODIUM CHLORIDE 0.9 % IV BOLUS
1000.0000 mL | Freq: Once | INTRAVENOUS | Status: AC
  Administered 2022-12-28: 1000 mL via INTRAVENOUS

## 2022-12-28 MED ORDER — ENOXAPARIN SODIUM 40 MG/0.4ML IJ SOSY: 40.0000 mg | PREFILLED_SYRINGE | INTRAMUSCULAR | Status: DC

## 2022-12-28 MED ORDER — ONDANSETRON HCL 4 MG PO TABS
4.0000 mg | ORAL_TABLET | Freq: Four times a day (QID) | ORAL | Status: DC | PRN
  Filled 2022-12-28: qty 1

## 2022-12-28 MED ORDER — IOHEXOL 300 MG/ML  SOLN
80.0000 mL | Freq: Once | INTRAMUSCULAR | Status: AC | PRN
  Administered 2022-12-28: 80 mL via INTRAVENOUS

## 2022-12-28 MED ORDER — ASPIRIN 81 MG PO TBEC: 81.0000 mg | DELAYED_RELEASE_TABLET | Freq: Every day | ORAL | Status: DC

## 2022-12-28 MED ORDER — PANTOPRAZOLE SODIUM 40 MG PO TBEC
40.0000 mg | DELAYED_RELEASE_TABLET | Freq: Two times a day (BID) | ORAL | Status: DC
  Administered 2022-12-28 – 2023-01-03 (×12): 40 mg via ORAL
  Filled 2022-12-28 (×12): qty 1

## 2022-12-28 MED ORDER — ONDANSETRON HCL 4 MG/2ML IJ SOLN
4.0000 mg | Freq: Four times a day (QID) | INTRAMUSCULAR | Status: DC | PRN
  Administered 2023-01-01 – 2023-01-02 (×3): 4 mg via INTRAVENOUS
  Filled 2022-12-28 (×3): qty 2

## 2022-12-28 MED ORDER — ATORVASTATIN CALCIUM 20 MG PO TABS
40.0000 mg | ORAL_TABLET | Freq: Every evening | ORAL | Status: DC
  Administered 2022-12-28 – 2023-01-02 (×6): 40 mg via ORAL
  Filled 2022-12-28 (×6): qty 2

## 2022-12-28 MED ORDER — ACETAMINOPHEN 325 MG PO TABS
650.0000 mg | ORAL_TABLET | Freq: Four times a day (QID) | ORAL | Status: DC | PRN
  Administered 2022-12-28: 650 mg via ORAL
  Filled 2022-12-28: qty 2

## 2022-12-28 MED ORDER — ACETAMINOPHEN 650 MG RE SUPP: 650.0000 mg | Freq: Four times a day (QID) | RECTAL | Status: DC | PRN

## 2022-12-28 MED ORDER — MESALAMINE ER 250 MG PO CPCR
1000.0000 mg | ORAL_CAPSULE | Freq: Two times a day (BID) | ORAL | Status: DC
  Administered 2022-12-28 – 2023-01-03 (×12): 1000 mg via ORAL
  Filled 2022-12-28 (×12): qty 4

## 2022-12-28 NOTE — ED Notes (Signed)
ED TO INPATIENT HANDOFF REPORT  Name/Age/Gender Felicia Benson 89 y.o. female  Code Status Code Status History     Date Active Date Inactive Code Status Order ID Comments User Context   10/28/2022 2232 11/15/2022 0036 Limited: Do not attempt resuscitation (DNR) -DNR-LIMITED -Do Not Intubate/DNI  [id number]  Angie Fava, DO ED   07/21/2022 2145 07/24/2022 2211 DNR [id number]  Core, Doy Hutching, MD ED   12/31/2020 0515 12/31/2020 2221 Full Code [id number]  Eduard Clos, MD ED   05/18/2019 0728 05/21/2019 1955 Full Code [id number]  Clydie Braun, MD ED   05/30/2015 1934 06/04/2015 1543 Full Code [id number]  Briscoe Deutscher, MD ED    Questions for Most Recent Historical Code Status (Order [id number])     Question Answer   If pulseless and not breathing No CPR or chest compressions.   In Pre-Arrest Conditions (Patient Is Breathing and Has A Pulse) Do not intubate. Provide all appropriate non-invasive medical interventions. Avoid ICU transfer unless indicated or required.   Consent: Discussion documented in EHR or advanced directives reviewed                Advance Directive Documentation    Flowsheet Row Most Recent Value  Type of Advance Directive Healthcare Power of Attorney, Living will  Pre-existing out of facility DNR order (yellow form or pink MOST form) --  "MOST" Form in Place? --       Home/SNF/Other Skilled nursing facility  Chief Complaint Diverticular hemorrhage [K57.31]  Level of Care/Admitting Diagnosis ED Disposition     ED Disposition  Admit   Condition  --   Comment  Hospital Area: [facility] [[id number]]  Level of Care: Med-Surg [16]  May admit patient to [facility] or [facility] if equivalent level of care is available:: No  Covid Evaluation: Asymptomatic - no recent exposure (last 10 days) testing not required  Diagnosis: Diverticular hemorrhage [321913]  Admitting Physician: Alan Mulder [[id number]]  Attending  Physician: Alan Mulder [[id number]]  Certification:: I certify this patient will need inpatient services for at least 2 midnights  Expected Medical Readiness: 12/31/2022          Medical History Past Medical History:  Diagnosis Date   AAA (abdominal aortic aneurysm) (HCC)    CHF (congestive heart failure) (HCC)    Crohn's disease (HCC)    Diverticulitis    Diverticulosis    GERD (gastroesophageal reflux disease)    GI bleed    Hypertension    OSA (obstructive sleep apnea)    Pacemaker    Thyroid nodule     Allergies Allergies  Allergen Reactions   Ibuprofen Other (See Comments)    "Allergic," per MAR and GI bleed   Ace Inhibitors Other (See Comments) and Cough    "Allergic," per [facility]    Breztri Aerosphere [Budeson-Glycopyrrol-Formoterol] Hypertension   Ciprofloxacin Other (See Comments)    "Allergic," per [facility]  and avoid fluoroquinolones due to asc aortic aneurysm   Levofloxacin Other (See Comments)    Aneurysm and "Allergic," per MAR    Nsaids Nausea And Vomiting and Other (See Comments)    Pt has preference to Tylenol and "Allergic," per [facility]     IV Location/Drains/Wounds Patient Lines/Drains/Airways Status     Active Line/Drains/Airways     Name Placement date Placement time Site Days   Peripheral IV 12/28/22 22 G Left Antecubital 12/28/22  1933  Antecubital  less than 1   Pressure Injury 11/12/22 Sacrum Mid Stage  2 -  Partial thickness loss of dermis presenting as a shallow open injury with a red, pink wound bed without slough. 11/12/22  1000  -- 46   Wound / Incision (Open or Dehisced) 11/12/22 Other (Comment) Buttocks Left Ecchymosis (DTI) 11/12/22  1000  Buttocks  46            Labs/Imaging Results for orders placed or performed during the hospital encounter of 12/28/22 (from the past 48 hours)  Type and screen [facility]     Status: None   Collection Time: 12/28/22  4:04 PM  Result Value Ref Range   ABO/RH(D) A POS    Antibody  Screen NEG    Sample Expiration      12/31/2022,2359 Performed at [facility], [address], [place], [place] [address]   POC occult blood, ED     Status: Abnormal   Collection Time: 12/28/22  4:12 PM  Result Value Ref Range   Fecal Occult Bld POSITIVE (A) NEGATIVE  CBC with Differential     Status: Abnormal   Collection Time: 12/28/22  4:17 PM  Result Value Ref Range   WBC 7.7 4.0 - 10.5 K/uL   RBC 2.83 (L) 3.87 - 5.11 MIL/uL   Hemoglobin 8.8 (L) 12.0 - 15.0 g/dL   HCT 60.4 (L) 54.0 - 98.1 %   MCV 99.3 80.0 - 100.0 fL   MCH 31.1 26.0 - 34.0 pg   MCHC 31.3 30.0 - 36.0 g/dL   RDW 19.1 (H) 47.8 - 29.5 %   Platelets 234 150 - 400 K/uL   nRBC 0.0 0.0 - 0.2 %   Neutrophils Relative % 69 %   Neutro Abs 5.3 1.7 - 7.7 K/uL   Lymphocytes Relative 19 %   Lymphs Abs 1.4 0.7 - 4.0 K/uL   Monocytes Relative 11 %   Monocytes Absolute 0.8 0.1 - 1.0 K/uL   Eosinophils Relative 1 %   Eosinophils Absolute 0.1 0.0 - 0.5 K/uL   Basophils Relative 0 %   Basophils Absolute 0.0 0.0 - 0.1 K/uL   Immature Granulocytes 0 %   Abs Immature Granulocytes 0.02 0.00 - 0.07 K/uL    Comment: Performed at [facility], [address], [place], [place] [address]  Comprehensive metabolic panel     Status: Abnormal   Collection Time: 12/28/22  4:17 PM  Result Value Ref Range   Sodium 137 135 - 145 mmol/L   Potassium 4.6 3.5 - 5.1 mmol/L   Chloride 99 98 - 111 mmol/L   CO2 31 22 - 32 mmol/L   Glucose, Bld 98 70 - 99 mg/dL    Comment: Glucose reference range applies only to samples taken after fasting for at least 8 hours.   BUN 27 (H) 8 - 23 mg/dL   Creatinine, Ser 8.65 0.44 - 1.00 mg/dL   Calcium 7.7 (L) 8.9 - 10.3 mg/dL   Total Protein 6.0 (L) 6.5 - 8.1 g/dL   Albumin 2.6 (L) 3.5 - 5.0 g/dL   AST 17 15 - 41 U/L   ALT 9 0 - 44 U/L   Alkaline Phosphatase 75 38 - 126 U/L   Total Bilirubin 0.8 <1.2 mg/dL   GFR, Estimated 56 (L) >60 mL/min    Comment:  (NOTE) Calculated using the CKD-EPI Creatinine Equation (2021)    Anion gap 7 5 - 15    Comment: Performed at [facility], [address] [place], [place] [address]  Lipase, blood     Status: None   Collection Time: 12/28/22  4:17 PM  Result Value Ref Range   Lipase 39 11 - 51 U/L    Comment: Performed at [facility], [address] [place], [place] [address]   CT ABDOMEN PELVIS W CONTRAST Result Date: 12/28/2022 CLINICAL DATA:  Lower gastrointestinal bleeding. Rectal bleeding. Loose bloody stools. EXAM: CT ABDOMEN AND PELVIS WITH CONTRAST TECHNIQUE: Multidetector CT imaging of the abdomen and pelvis was performed using the standard protocol following bolus administration of intravenous contrast. RADIATION DOSE REDUCTION: This exam was performed according to the departmental dose-optimization program which includes automated exposure control, adjustment of the mA and/or kV according to patient size and/or use of iterative reconstruction technique. CONTRAST:  80mL OMNIPAQUE IOHEXOL 300 MG/ML  SOLN COMPARISON:  Abdominal radiographs 11/11/2022. CT abdomen and pelvis 11/03/2022 and 10/18/2020 FINDINGS: Lower chest: Emphysematous changes in the lung bases. Small bilateral pleural effusions with basilar atelectasis. Hepatobiliary: Calcified granulomas in the liver. Surgical absence of the gallbladder. Bile ducts are unremarkable. Pancreas: Cyst at the junction of the body and tail of the pancreas measuring 1.4 cm diameter. No change since previous study. No inflammatory changes suggested around the pancreas. Spleen: Calcified granulomas in the spleen. Adrenals/Urinary Tract: Left adrenal gland nodule, 1.6 cm diameter, 133 Hounsfield units density. No change since prior study dated 10/18/2020. Kidneys are symmetrical with homogeneous nephrograms. No hydronephrosis or hydroureter. Bladder is normal. Stomach/Bowel: Stomach, small bowel, and colon are not abnormally  distended. No wall thickening or inflammatory changes. Diverticulosis of the sigmoid colon. No evidence of acute diverticulitis. Appendix is not identified. Vascular/Lymphatic: Aortic atherosclerosis. No enlarged abdominal or pelvic lymph nodes. Reproductive: Status post hysterectomy. No adnexal masses. Other: No abdominal wall hernia or abnormality. No abdominopelvic ascites. Musculoskeletal: Degenerative changes in the spine and hips. Postoperative fixation of the right proximal femur. No acute bony abnormalities. IMPRESSION: 1. No acute process demonstrated in the abdomen or pelvis. 2. Diverticulosis of the colon without evidence of acute diverticulitis. 3. Small bilateral pleural effusions with basilar atelectasis. 4. 1.6 cm left adrenal gland nodule is unchanged since 10/18/2020. The CT appearance is indeterminate but long-term stability suggests benign etiology. No additional follow-up is indicated. 5. 1.4 cm diameter cyst in the body of the pancreas, unchanged since 10/18/2020. Long-term stability suggests benign etiology. Electronically Signed   By: Burman Nieves M.D.   On: 12/28/2022 19:41    Pending Labs Unresulted Labs (From admission, onward)     Start     Ordered   12/28/22 1541  Urinalysis, Routine w reflex microscopic -Urine, Clean Catch  (ED Abdominal Pain)  Once,   URGENT       Question:  Specimen Source  Answer:  Urine, Clean Catch   12/28/22 1541            Vitals/Pain Today's Vitals   12/28/22 1533 12/28/22 1536 12/28/22 1730  BP:  121/82 125/74  Pulse:  (!) 102 90  Resp:  18 17  Temp:  97.9 F (36.6 C)   TempSrc:  Oral   SpO2:  98% 96%  Weight: 85.3 kg    Height: 5\' 6"  (1.676 m)    PainSc: 0-No pain      Isolation Precautions No active isolations  Medications Medications  sodium chloride 0.9 % bolus 1,000 mL (1,000 mLs Intravenous New Bag/Given 12/28/22 1919)  iohexol (OMNIPAQUE) 300 MG/ML solution 80 mL (80 mLs Intravenous Contrast Given 12/28/22 1757)     Mobility non-ambulatory

## 2022-12-28 NOTE — ED Notes (Signed)
Occult was positive but wouldn't transfer over

## 2022-12-28 NOTE — ED Notes (Signed)
Patient changed with 2nd nurse assistance due to a bloody bowel movement before transferring patient to floor.

## 2022-12-28 NOTE — ED Notes (Signed)
Patient resting in bed.

## 2022-12-28 NOTE — ED Triage Notes (Signed)
Brought by [facility] from [facility]. Called for rectal bleeding. Denied N/V but had loose stools that had blood in it. [facility] 10/30/2022. Vitals WDL except O2. CBG 163. On O2 at home.

## 2022-12-28 NOTE — ED Provider Notes (Signed)
[place] EMERGENCY DEPARTMENT AT [facility] Provider Note   CSN: [id number] Arrival date & time: 12/28/22  1523     History  No chief complaint on file.   Felicia Benson is a 89 y.o. female.  The history is provided by the patient and medical records. No language interpreter was used.     89 year old female with a history of Crohn's disease, blood loss anemia, CHF, COPD, AAA, has pacemaker, on baby ASA brought here via EMS with concerns of rectal bleeding.  Patient states that a nursing facility.  This morning she had a large bowel movement with bright red blood.  Patient described as "explosive".  A few hours she had another bowel movement filled with blood therefore patient was brought here for further assessment.  She denies any fever or chills no lightheadedness or dizziness no chest pain or shortness of breath no abdominal pain no back pain no urinary symptoms.  She has had history of diverticular bleed in the past.  She also has history of hemorrhoid.  She denies any recent trauma.  She is amenable for blood transfusion if needed.  Patient is a DNR/DNI.  She does endorse some minimal lower abdominal discomfort but states it is not painful.  Her GI specialist is Dr. Bosie Clos.  Home Medications Prior to Admission medications   Medication Sig Start Date End Date Taking? Authorizing Provider  acetaminophen (TYLENOL) 500 MG tablet Take 1,000 mg by mouth daily.    [provider]  albuterol (VENTOLIN HFA) 108 (90 Base) MCG/ACT inhaler Inhale 2 puffs into the lungs every 6 (six) hours as needed for wheezing or shortness of breath. 07/24/22   [name], MD  aspirin EC 81 MG tablet Take 1 tablet (81 mg total) by mouth daily. Swallow whole. 03/29/21   [name], MD  atorvastatin (LIPITOR) 40 MG tablet Take 40 mg by mouth every evening.    [provider]  docusate sodium (COLACE) 100 MG capsule Take 2 capsules (200 mg total) by mouth 2 (two) times  daily. Patient taking differently: Take 200 mg by mouth daily. 07/24/22   [name], MD  enoxaparin (LOVENOX) 40 MG/0.4ML injection Inject 0.4 mLs (40 mg total) into the skin daily for 28 days. 10/31/22 11/28/22  Cecil Cobbs, PA-C  fluticasone (FLONASE) 50 MCG/ACT nasal spray Place 2 sprays into both nostrils daily. Patient taking differently: Place 2 sprays into both nostrils daily as needed for allergies. 08/06/22   Cobb, Ruby Cola, NP  furosemide (LASIX) 40 MG tablet Take 1 tablet (40 mg total) by mouth daily. 11/15/22   Meredeth Ide, MD  melatonin 3 MG TABS tablet Take 1 tablet (3 mg total) by mouth at bedtime. Patient taking differently: Take 3 mg by mouth at bedtime as needed (sleep). 07/24/22   [name], MD  mesalamine (PENTASA) 500 MG CR capsule Take 1,000 mg by mouth 2 (two) times daily.    [provider]  mometasone-formoterol (DULERA) 100-5 MCG/ACT AERO Inhale 2 puffs into the lungs 2 (two) times daily.    [provider]  montelukast (SINGULAIR) 10 MG tablet Take 1 tablet (10 mg total) by mouth daily. Patient taking differently: Take 10 mg by mouth every evening. 08/01/21   [name], Adewale A, MD  pantoprazole (PROTONIX) 40 MG tablet Take 40 mg by mouth 2 (two) times daily.    [provider]  polyethylene glycol (MIRALAX / GLYCOLAX) 17 g packet Take 17 g by mouth daily as  needed. Patient taking differently: Take 17 g by mouth daily as needed for mild constipation. 07/24/22   [name], MD  Propyl Glycol-Hydroxyethylcell (NASAL MOIST) GEL Place 1 application  into the nose 2 (two) times daily.    [provider]      Allergies    Ibuprofen, Ace inhibitors, Breztri aerosphere [budeson-glycopyrrol-formoterol], Ciprofloxacin, Levofloxacin, and Nsaids    Review of Systems   Review of Systems  All other systems reviewed and are negative.   Physical Exam Updated Vital Signs BP 125/74   Pulse 90   Temp 97.9 F (36.6 C)  (Oral)   Resp 17   Ht 5\' 6"  (1.676 m)   Wt 85.3 kg   SpO2 96%   BMI 30.34 kg/m  Physical Exam Vitals and nursing note reviewed.  Constitutional:      General: She is not in acute distress.    Appearance: She is well-developed. She is obese.     Comments: Wearing supplemental oxygen  HENT:     Head: Atraumatic.  Eyes:     Conjunctiva/sclera: Conjunctivae normal.  Cardiovascular:     Rate and Rhythm: Tachycardia present.     Pulses: Normal pulses.     Heart sounds: Normal heart sounds.  Pulmonary:     Effort: Pulmonary effort is normal.     Breath sounds: No wheezing, rhonchi or rales.  Abdominal:     Palpations: Abdomen is soft.     Tenderness: There is abdominal tenderness (Minimal left lower quadrant tenderness no guarding or rebound tenderness).  Genitourinary:    Comments: Chaperone present during exam.  Normal rectal tone no obvious mass maroon-colored stool on glove Musculoskeletal:     Cervical back: Neck supple.  Skin:    Findings: No rash.  Neurological:     Mental Status: She is alert.  Psychiatric:        Mood and Affect: Mood normal.     ED Results / Procedures / Treatments   Labs (all labs ordered are listed, but only abnormal results are displayed) Labs Reviewed  CBC WITH DIFFERENTIAL/PLATELET - Abnormal; Notable for the following components:      Result Value   RBC 2.83 (*)    Hemoglobin 8.8 (*)    HCT 28.1 (*)    RDW 18.5 (*)    All other components within normal limits  COMPREHENSIVE METABOLIC PANEL - Abnormal; Notable for the following components:   BUN 27 (*)    Calcium 7.7 (*)    Total Protein 6.0 (*)    Albumin 2.6 (*)    GFR, Estimated 56 (*)    All other components within normal limits  POC OCCULT BLOOD, ED - Abnormal; Notable for the following components:   Fecal Occult Bld POSITIVE (*)    All other components within normal limits  LIPASE, BLOOD  URINALYSIS, ROUTINE W REFLEX MICROSCOPIC  TYPE AND SCREEN     EKG None  Radiology CT ABDOMEN PELVIS W CONTRAST Result Date: 12/28/2022 CLINICAL DATA:  Lower gastrointestinal bleeding. Rectal bleeding. Loose bloody stools. EXAM: CT ABDOMEN AND PELVIS WITH CONTRAST TECHNIQUE: Multidetector CT imaging of the abdomen and pelvis was performed using the standard protocol following bolus administration of intravenous contrast. RADIATION DOSE REDUCTION: This exam was performed according to the departmental dose-optimization program which includes automated exposure control, adjustment of the mA and/or kV according to patient size and/or use of iterative reconstruction technique. CONTRAST:  80mL OMNIPAQUE IOHEXOL 300 MG/ML  SOLN COMPARISON:  Abdominal radiographs 11/11/2022. CT abdomen  and pelvis 11/03/2022 and 10/18/2020 FINDINGS: Lower chest: Emphysematous changes in the lung bases. Small bilateral pleural effusions with basilar atelectasis. Hepatobiliary: Calcified granulomas in the liver. Surgical absence of the gallbladder. Bile ducts are unremarkable. Pancreas: Cyst at the junction of the body and tail of the pancreas measuring 1.4 cm diameter. No change since previous study. No inflammatory changes suggested around the pancreas. Spleen: Calcified granulomas in the spleen. Adrenals/Urinary Tract: Left adrenal gland nodule, 1.6 cm diameter, 133 Hounsfield units density. No change since prior study dated 10/18/2020. Kidneys are symmetrical with homogeneous nephrograms. No hydronephrosis or hydroureter. Bladder is normal. Stomach/Bowel: Stomach, small bowel, and colon are not abnormally distended. No wall thickening or inflammatory changes. Diverticulosis of the sigmoid colon. No evidence of acute diverticulitis. Appendix is not identified. Vascular/Lymphatic: Aortic atherosclerosis. No enlarged abdominal or pelvic lymph nodes. Reproductive: Status post hysterectomy. No adnexal masses. Other: No abdominal wall hernia or abnormality. No abdominopelvic ascites.  Musculoskeletal: Degenerative changes in the spine and hips. Postoperative fixation of the right proximal femur. No acute bony abnormalities. IMPRESSION: 1. No acute process demonstrated in the abdomen or pelvis. 2. Diverticulosis of the colon without evidence of acute diverticulitis. 3. Small bilateral pleural effusions with basilar atelectasis. 4. 1.6 cm left adrenal gland nodule is unchanged since 10/18/2020. The CT appearance is indeterminate but long-term stability suggests benign etiology. No additional follow-up is indicated. 5. 1.4 cm diameter cyst in the body of the pancreas, unchanged since 10/18/2020. Long-term stability suggests benign etiology. Electronically Signed   By: Burman Nieves M.D.   On: 12/28/2022 19:41    Procedures Procedures    Medications Ordered in ED Medications  sodium chloride 0.9 % bolus 1,000 mL (1,000 mLs Intravenous New Bag/Given 12/28/22 1919)  iohexol (OMNIPAQUE) 300 MG/ML solution 80 mL (80 mLs Intravenous Contrast Given 12/28/22 1757)    ED Course/ Medical Decision Making/ A&P                                 Medical Decision Making Amount and/or Complexity of Data Reviewed Labs: ordered. Radiology: ordered.  Risk Prescription drug management.   BP 121/82   Pulse (!) 102   Temp 97.9 F (36.6 C) (Oral)   Resp 18   Ht 5\' 6"  (1.676 m)   Wt 85.3 kg   SpO2 98%   BMI 30.34 kg/m   84:15 PM  89 year old female with a history of Crohn's disease, blood loss anemia, CHF, COPD, AAA, has pacemaker, on baby ASA brought here via EMS with concerns of rectal bleeding.  Patient states that a nursing facility.  This morning she had a large bowel movement with bright red blood.  Patient described as "explosive".  A few hours she had another bowel movement filled with blood therefore patient was brought here for further assessment.  She denies any fever or chills no lightheadedness or dizziness no chest pain or shortness of breath no abdominal pain no back  pain no urinary symptoms.  She has had history of diverticular bleed in the past.  She also has history of hemorrhoid.  She denies any recent trauma.  She is amenable for blood transfusion if needed.  Patient is a DNR/DNI.  She does endorse some minimal lower abdominal discomfort but states it is not painful.  Her GI specialist is Dr. Bosie Clos.  On exam, patient is resting comfortably in bed appears to be in no acute discomfort.  She is on  supplemental oxygen this is at her baseline.  Heart with mild tachycardia lungs with decreased breath sounds but no overt wheezes rales or rhonchi abdomen is soft with minimal left lower quadrant tenderness.  Chaperone was present during exam patient does have maroon-colored blood on glove but no obvious rectal mass or thrombosed hemorrhoid.  She has normal rectal tone.  -Labs ordered, independently viewed and interpreted by me.  Labs remarkable for fecal occult blood test positive.  Hgb 8.8, near baseline.  -The patient was maintained on a cardiac monitor.  I personally viewed and interpreted the cardiac monitored which showed an underlying rhythm of: sinus tachycardia -Imaging independently viewed and interpreted by me and I agree with radiologist's interpretation.  Result remarkable for abd/pelvis CT showing no acute process.  Evidence of diverticulosis but no diverticulitis -This patient presents to the ED for concern of GIB, this involves an extensive number of treatment options, and is a complaint that carries with it a high risk of complications and morbidity.  The differential diagnosis includes lower GI Bleed, diverticular bleed, hemorrhoidal bleed, colonic angioectasis, colitis, crohns, colorectal neoplasia -Co morbidities that complicate the patient evaluation includes diverticular bleed, crohn's disease, AAA, COPD -Treatment includes IVF -Reevaluation of the patient after these medicines showed that the patient improved -PCP office notes or outside notes  reviewed -Discussion with specialist GI specialist Dr. Dulce Sellar who request medicine admission.  If pt has another large rectal bleed then to obtain CT angio.  I spoke with [facility] Dr. Avie Arenas who agrees to admit pt -Escalation to admission/observation considered: patient is agreeable with admission         Final Clinical Impression(s) / ED Diagnoses Final diagnoses:  Lower GI bleed    Rx / DC Orders ED Discharge Orders     None         Fayrene Helper, PA-C 12/28/22 2031    Rexford Maus, DO 12/31/22 [phone]

## 2022-12-28 NOTE — ED Notes (Signed)
Patient transported to CT 

## 2022-12-28 NOTE — ED Notes (Signed)
Patient transported upstairs at this time.

## 2022-12-28 NOTE — H&P (Signed)
History and Physical    Felicia Benson:[id number] DOB: September 12, 1930 DOA: 12/28/2022  PCP: [name], MD   Chief Complaint: brbpr  HPI: Felicia Benson is a 89 y.o. female with medical history significant of abdominal aortic aneurysm, CHF, Crohn's disease, diverticulitis, diverticulosis, GERD, hypertension who presented via EMS due to bright red blood per rectum.  Patient lives in a nursing facility and had a large bloody bowel movement that was described as explosive.  She had another bloody bowel movement and presented to the ER for further assessment.  She was otherwise asymptomatic.  She has a history of diverticulosis and had diverticular bleeds in the past.  On arrival to the ER she was afebrile hemodynamically stable.  Labs were obtained which Dems rated WBC 7.7, hemoglobin 8.8 at baseline, creatinine 0.97.  Patient underwent CT with contrast of the abdomen which demonstrated no acute findings however diverticulosis and adrenal gland.  There is also a 1.4 cm cyst in the body of pancreas which was unchanged from prior.  Patient was admitted for observation with diverticular hemorrhage.  Emergency department had consulted GI and recommended observation.  Throughout the night she had 2 cups of blood in her stool.  She developed mild tachycardia.  Hemoglobin was ordered for the morning.   Review of Systems: Review of Systems  Constitutional:  Negative for chills and fever.  HENT: Negative.    Eyes: Negative.   Respiratory: Negative.  Negative for cough and hemoptysis.   Cardiovascular: Negative.  Negative for chest pain and palpitations.  Gastrointestinal:  Positive for blood in stool.  Genitourinary: Negative.   Skin: Negative.   Neurological: Negative.      As per HPI otherwise 10 point review of systems negative.   Allergies  Allergen Reactions   Ibuprofen Other (See Comments)    "Allergic," per MAR and GI bleed   Ace Inhibitors Other (See Comments) and Cough     "Allergic," per [facility]    Breztri Aerosphere [Budeson-Glycopyrrol-Formoterol] Hypertension   Ciprofloxacin Other (See Comments)    "Allergic," per [facility]  and avoid fluoroquinolones due to asc aortic aneurysm   Levofloxacin Other (See Comments)    Aneurysm and "Allergic," per MAR    Nsaids Nausea And Vomiting and Other (See Comments)    Pt has preference to Tylenol and "Allergic," per [facility]     Past Medical History:  Diagnosis Date   AAA (abdominal aortic aneurysm) (HCC)    CHF (congestive heart failure) (HCC)    Crohn's disease (HCC)    Diverticulitis    Diverticulosis    GERD (gastroesophageal reflux disease)    GI bleed    Hypertension    OSA (obstructive sleep apnea)    Pacemaker    Thyroid nodule     Past Surgical History:  Procedure Laterality Date   CHOLECYSTECTOMY     COLONOSCOPY WITH PROPOFOL N/A 06/01/2015   Procedure: COLONOSCOPY WITH PROPOFOL;  Surgeon: Charlott Rakes, MD;  Location: [facility] ENDOSCOPY;  Service: Endoscopy;  Laterality: N/A;   CORONARY ANGIOPLASTY WITH STENT PLACEMENT     ESOPHAGOGASTRODUODENOSCOPY (EGD) WITH PROPOFOL N/A 06/01/2015   Procedure: ESOPHAGOGASTRODUODENOSCOPY (EGD) WITH PROPOFOL;  Surgeon: Charlott Rakes, MD;  Location: [facility] ENDOSCOPY;  Service: Endoscopy;  Laterality: N/A;   GIVENS CAPSULE STUDY N/A 06/03/2015   Procedure: GIVENS CAPSULE STUDY;  Surgeon: Charlott Rakes, MD;  Location: [facility] ENDOSCOPY;  Service: Endoscopy;  Laterality: N/A;   INTRAMEDULLARY (IM) NAIL INTERTROCHANTERIC Right 10/30/2022   Procedure: INTRAMEDULLARY nailing of right femur;  Surgeon: Joen Laura, MD;  Location: [facility] OR;  Service: Orthopedics;  Laterality: Right;   PACEMAKER PLACEMENT  2015     reports that she has quit smoking. She has never used smokeless tobacco. She reports that she does not drink alcohol and does not use drugs.  Family History  Problem Relation Age of Onset   CVA Mother    Lung cancer Father    Colon cancer Neg Hx     Prior to  Admission medications   Medication Sig Start Date End Date Taking? Authorizing Provider  albuterol (VENTOLIN HFA) 108 (90 Base) MCG/ACT inhaler Inhale 2 puffs into the lungs every 6 (six) hours as needed for wheezing or shortness of breath. 07/24/22  Yes [name], MD  Amino Acids-Protein Hydrolys (PRO-STAT) LIQD Take 30 mLs by mouth 2 (two) times daily.   Yes [provider]  aspirin EC 81 MG tablet Take 1 tablet (81 mg total) by mouth daily. Swallow whole. 03/29/21  Yes [name], MD  atorvastatin (LIPITOR) 40 MG tablet Take 40 mg by mouth every evening.   Yes [provider]  fluticasone (FLONASE) 50 MCG/ACT nasal spray Place 2 sprays into both nostrils daily. Patient taking differently: Place 2 sprays into both nostrils in the morning. 08/06/22  Yes Cobb, Ruby Cola, NP  furosemide (LASIX) 20 MG tablet Take 60 mg by mouth in the morning.   Yes [provider]  lidocaine (LIDODERM) 5 % Place 1 patch onto the skin See admin instructions. Apply 1 new patch to the lower back in the morning- Remove & Discard patch within 12 hours or as directed by MD   Yes [provider]  loratadine (CLARITIN) 10 MG tablet Take 10 mg by mouth daily.   Yes [provider]  meclizine (ANTIVERT) 12.5 MG tablet Take 12.5 mg by mouth every 8 (eight) hours as needed for dizziness.   Yes [provider]  melatonin 3 MG TABS tablet Take 1 tablet (3 mg total) by mouth at bedtime. 07/24/22  Yes [name], MD  mometasone-formoterol [facility]) 100-5 MCG/ACT AERO Inhale 2 puffs into the lungs in the morning and at bedtime.   Yes [provider]  montelukast (SINGULAIR) 10 MG tablet Take 1 tablet (10 mg total) by mouth daily. 08/01/21  Yes [name], Adewale A, MD  pantoprazole (PROTONIX) 40 MG tablet Take 40 mg by mouth in the morning and at bedtime.   Yes [provider]  PENTASA 500 MG CR capsule Take 1,000 mg by mouth in the morning and at bedtime.    Yes [provider]  polyethylene glycol (MIRALAX / GLYCOLAX) 17 g packet Take 17 g by mouth daily as needed. Patient taking differently: Take 17 g by mouth daily as needed for mild constipation (to be mixed into 4 ounces of water). 07/24/22  Yes [name], MD  potassium chloride SA (KLOR-CON M) 20 MEQ tablet Take 40 mEq by mouth daily.   Yes [provider]  Propyl Glycol-Hydroxyethylcell (NASAL MOIST) GEL Place 1 application  into both nostrils in the morning and at bedtime.   Yes [provider]  TYLENOL 500 MG tablet Take 1,000 mg by mouth every 8 (eight) hours as needed (for pain).   Yes [provider]  docusate sodium (COLACE) 100 MG capsule Take 2 capsules (200 mg total) by mouth 2 (two) times daily. Patient not taking: Reported on 12/28/2022 07/24/22   [name], MD  enoxaparin (LOVENOX) 40 MG/0.4ML injection Inject  0.4 mLs (40 mg total) into the skin daily for 28 days. Patient not taking: Reported on 12/28/2022 10/31/22 12/28/22  Cecil Cobbs, PA-C    Physical Exam: Vitals:   12/28/22 1536 12/28/22 1730 12/28/22 2152 12/28/22 2153  BP: 121/82 125/74 113/78   Pulse: (!) 102 90 (!) 109 (!) 108  Resp: 18 17 18    Temp: 97.9 F (36.6 C)   97.9 F (36.6 C)  TempSrc: Oral   Oral  SpO2: 98% 96% 99% 98%  Weight:      Height:       Physical Exam Vitals reviewed.  Constitutional:      Appearance: She is normal weight.  HENT:     Head: Normocephalic.     Nose: Nose normal.     Mouth/Throat:     Mouth: Mucous membranes are moist.     Pharynx: Oropharynx is clear.  Eyes:     Conjunctiva/sclera: Conjunctivae normal.     Pupils: Pupils are equal, round, and reactive to light.  Cardiovascular:     Rate and Rhythm: Normal rate and regular rhythm.     Pulses: Normal pulses.     Heart sounds: Normal heart sounds.  Pulmonary:     Breath sounds: Normal breath sounds.  Abdominal:     General: Abdomen is flat.  Musculoskeletal:         General: Normal range of motion.     Cervical back: Normal range of motion.  Skin:    General: Skin is warm.     Capillary Refill: Capillary refill takes less than 2 seconds.  Neurological:     General: No focal deficit present.     Mental Status: She is alert.  Psychiatric:        Mood and Affect: Mood normal.        Labs on Admission: I have personally reviewed the patients's labs and imaging studies.  Assessment/Plan Principal Problem:   Diverticular hemorrhage   # Hematochezia most likely secondary to diverticular hemorrhage # IBD on Pentasa - Patient has history of inflammatory bowel disease and is on Pentasa -Previous good control of inflammatory bowel disease - Hemoglobin at baseline --Previously seen for hip fracture in November Plan: Placed on clear liquid diet Check inflammatory markers Check fecal calprotectin Check hemoglobin in morning and sooner if recurrent bloody bowel movements  # Chronic respiratory failure-patient typically on 4 L oxygen at baseline.  Will continue home inhalers  # History of Crohn's disease-continue Pentasa  # Hyperlipidemia-continue Lipitor  # CAD-continue aspirin  # History of diastolic HF, not in exacerbation-continue Lasix 60 mg daily  # GERD-continue pantoprazole  Admission status: Inpatient Med-Surg  Certification: The appropriate patient status for this patient is INPATIENT. Inpatient status is judged to be reasonable and necessary in order to provide the required intensity of service to ensure the patient's safety. The patient's presenting symptoms, physical exam findings, and initial radiographic and laboratory data in the context of their chronic comorbidities is felt to place them at high risk for further clinical deterioration. Furthermore, it is not anticipated that the patient will be medically stable for discharge from the hospital within 2 midnights of admission.   * I certify that at the point of admission it  is my clinical judgment that the patient will require inpatient hospital care spanning beyond 2 midnights from the point of admission due to high intensity of service, high risk for further deterioration and high frequency of surveillance required.Molly Maduro Lenord Fralix  MD [facility] If 7PM-7AM, please contact night-coverage [email or web address]  12/28/2022, 9:56 PM

## 2022-12-29 DIAGNOSIS — K5731 Diverticulosis of large intestine without perforation or abscess with bleeding: Secondary | ICD-10-CM | POA: Diagnosis not present

## 2022-12-29 LAB — GASTROINTESTINAL PANEL BY PCR, STOOL (REPLACES STOOL CULTURE)

## 2022-12-29 LAB — CBC
HCT: 23.5 % — ABNORMAL LOW (ref 36.0–46.0)
HCT: 21.1 % — ABNORMAL LOW (ref 36.0–46.0)
Hemoglobin: 7.1 g/dL — ABNORMAL LOW (ref 12.0–15.0)
Hemoglobin: 6.6 g/dL — CL (ref 12.0–15.0)
MCH: 30.7 pg (ref 26.0–34.0)
MCH: 31 pg (ref 26.0–34.0)
MCHC: 30.2 g/dL (ref 30.0–36.0)
MCHC: 31.3 g/dL (ref 30.0–36.0)
MCV: 101.7 fL — ABNORMAL HIGH (ref 80.0–100.0)
MCV: 99.1 fL (ref 80.0–100.0)
Platelets: 208 10*3/uL (ref 150–400)
Platelets: 181 10*3/uL (ref 150–400)
RBC: 2.31 MIL/uL — ABNORMAL LOW (ref 3.87–5.11)
RBC: 2.13 MIL/uL — ABNORMAL LOW (ref 3.87–5.11)
RDW: 18.6 % — ABNORMAL HIGH (ref 11.5–15.5)
RDW: 18.4 % — ABNORMAL HIGH (ref 11.5–15.5)
WBC: 8.7 10*3/uL (ref 4.0–10.5)
WBC: 8.5 10*3/uL (ref 4.0–10.5)
nRBC: 0 % (ref 0.0–0.2)
nRBC: 0 % (ref 0.0–0.2)

## 2022-12-29 LAB — PREPARE RBC (CROSSMATCH)

## 2022-12-29 LAB — C-REACTIVE PROTEIN: CRP: 1.1 mg/dL — ABNORMAL HIGH (ref <1.0)

## 2022-12-29 LAB — SEDIMENTATION RATE: Sed Rate: 32 mm/h — ABNORMAL HIGH (ref 0–22)

## 2022-12-29 MED ORDER — SODIUM CHLORIDE 0.9% IV SOLUTION: Freq: Once | INTRAVENOUS | Status: AC

## 2022-12-29 MED ORDER — ORAL CARE MOUTH RINSE: 15.0000 mL | OROMUCOSAL | Status: DC | PRN

## 2022-12-29 NOTE — TOC Progression Note (Signed)
Transition of Care [facility]) - Progression Note    Patient Details  Name: ALEEZAH GWATHNEY MRN: [id number] Date of Birth: 07/20/1930  [facility] CM/SW Contact  Adrian Prows, RN Phone Number: 12/29/2022, 4:07 PM  Clinical Narrative:    Rickard Patience at [facility]; she verified pt is at short term rehab at facility, and she can return at d/c.        Expected Discharge Plan and Services                                               Social Determinants of Health (SDOH) Interventions SDOH Screenings   Food Insecurity: No Food Insecurity (12/28/2022)  Housing: High Risk (12/28/2022)  Transportation Needs: No Transportation Needs (12/28/2022)  Utilities: Not At Risk (12/28/2022)  Tobacco Use: Medium Risk (12/28/2022)    Readmission Risk Interventions    07/24/2022    2:49 PM  Readmission Risk Prevention Plan  Transportation Screening Complete  PCP or Specialist Appt within 5-7 Days Complete  Home Care Screening Complete  Medication Review (RN CM) Complete

## 2022-12-29 NOTE — Hospital Course (Signed)
PMH of CHF, Crohn's disease, GERD, HTN, AAA present to the hospital with complaints of BRBPR. Found to have a diverticular bleed with anemia. GI consulted. Conservatively managed for now. Assessment and Plan: BRBPR.  Hematochezia. Acute blood loss anemia. Painless blood in the stool. So far no bleeding in the hospital. Hemoglobin continues to drop down likely dilutional in nature. GI consulted. CT abdomen shows no acute abnormality. GI recommending conservative measures for now. Transfuse for hemoglobin less than 7. Initially plan was to switch her to full liquid diet but currently I have placed her back on clear liquid diet.  History of CAD. Patient was on aspirin.  Will discontinue.  Crohn's disease. On Pentasa. Less likely inflammatory activity. Monitor for now.  Chronic respiratory failure. On 4 LPM Monitor.  HLD.  On statin.  Continue.  Chronic diastolic CHF On Lasix. Currently on hold. No volume overload for now.  GERD prevention on PPI. Will continue.

## 2022-12-29 NOTE — Consult Note (Signed)
[facility] Consultation Note  Referring Provider: [facility] Primary Care Physician:  [name], MD Primary Gastroenterologist:  Dr. Bosie Clos  Reason for Consultation:  hematochezia  HPI: Felicia Benson is a 89 y.o. female admitted hematochezia.  No abdominal pain, hematemesis, melena.  Bleeding has nearly stopped since admission, just small smear of blood today in her diaper.  No anticoagulants.  Takes baby aspirin.  Colonoscopy 2017 showed left-sided diverticulosis otherwise unrevealing.  Follows Dr. Bosie Clos and is on Pentasa for purported history of Crohn's disease.   Past Medical History:  Diagnosis Date   AAA (abdominal aortic aneurysm) (HCC)    CHF (congestive heart failure) (HCC)    Crohn's disease (HCC)    Diverticulitis    Diverticulosis    GERD (gastroesophageal reflux disease)    GI bleed    Hypertension    OSA (obstructive sleep apnea)    Pacemaker    Thyroid nodule     Past Surgical History:  Procedure Laterality Date   CHOLECYSTECTOMY     COLONOSCOPY WITH PROPOFOL N/A 06/01/2015   Procedure: COLONOSCOPY WITH PROPOFOL;  Surgeon: Charlott Rakes, MD;  Location: [facility] ENDOSCOPY;  Service: Endoscopy;  Laterality: N/A;   CORONARY ANGIOPLASTY WITH STENT PLACEMENT     ESOPHAGOGASTRODUODENOSCOPY (EGD) WITH PROPOFOL N/A 06/01/2015   Procedure: ESOPHAGOGASTRODUODENOSCOPY (EGD) WITH PROPOFOL;  Surgeon: Charlott Rakes, MD;  Location: [facility] ENDOSCOPY;  Service: Endoscopy;  Laterality: N/A;   GIVENS CAPSULE STUDY N/A 06/03/2015   Procedure: GIVENS CAPSULE STUDY;  Surgeon: Charlott Rakes, MD;  Location: [facility] ENDOSCOPY;  Service: Endoscopy;  Laterality: N/A;   INTRAMEDULLARY (IM) NAIL INTERTROCHANTERIC Right 10/30/2022   Procedure: INTRAMEDULLARY nailing of right femur;  Surgeon: Joen Laura, MD;  Location: [facility] OR;  Service: Orthopedics;  Laterality: Right;   PACEMAKER PLACEMENT  2015    Prior to Admission medications   Medication Sig Start Date  End Date Taking? Authorizing Provider  albuterol (VENTOLIN HFA) 108 (90 Base) MCG/ACT inhaler Inhale 2 puffs into the lungs every 6 (six) hours as needed for wheezing or shortness of breath. 07/24/22  Yes [name], MD  Amino Acids-Protein Hydrolys (PRO-STAT) LIQD Take 30 mLs by mouth 2 (two) times daily.   Yes [provider]  aspirin EC 81 MG tablet Take 1 tablet (81 mg total) by mouth daily. Swallow whole. 03/29/21  Yes [name], MD  atorvastatin (LIPITOR) 40 MG tablet Take 40 mg by mouth every evening.   Yes [provider]  fluticasone (FLONASE) 50 MCG/ACT nasal spray Place 2 sprays into both nostrils daily. Patient taking differently: Place 2 sprays into both nostrils in the morning. 08/06/22  Yes Cobb, Ruby Cola, NP  furosemide (LASIX) 20 MG tablet Take 60 mg by mouth in the morning.   Yes [provider]  lidocaine (LIDODERM) 5 % Place 1 patch onto the skin See admin instructions. Apply 1 new patch to the lower back in the morning- Remove & Discard patch within 12 hours or as directed by MD   Yes [provider]  loratadine (CLARITIN) 10 MG tablet Take 10 mg by mouth daily.   Yes [provider]  meclizine (ANTIVERT) 12.5 MG tablet Take 12.5 mg by mouth every 8 (eight) hours as needed for dizziness.   Yes [provider]  melatonin 3 MG TABS tablet Take 1 tablet (3 mg total) by mouth at bedtime. 07/24/22  Yes [name], MD  mometasone-formoterol [facility]) 100-5 MCG/ACT AERO Inhale 2 puffs into the lungs in the  morning and at bedtime.   Yes [provider]  montelukast (SINGULAIR) 10 MG tablet Take 1 tablet (10 mg total) by mouth daily. 08/01/21  Yes [name], Adewale A, MD  pantoprazole (PROTONIX) 40 MG tablet Take 40 mg by mouth in the morning and at bedtime.   Yes [provider]  PENTASA 500 MG CR capsule Take 1,000 mg by mouth in the morning and at bedtime.   Yes [provider]  polyethylene  glycol (MIRALAX / GLYCOLAX) 17 g packet Take 17 g by mouth daily as needed. Patient taking differently: Take 17 g by mouth daily as needed for mild constipation (to be mixed into 4 ounces of water). 07/24/22  Yes [name], MD  potassium chloride SA (KLOR-CON M) 20 MEQ tablet Take 40 mEq by mouth daily.   Yes [provider]  Propyl Glycol-Hydroxyethylcell (NASAL MOIST) GEL Place 1 application  into both nostrils in the morning and at bedtime.   Yes [provider]  TYLENOL 500 MG tablet Take 1,000 mg by mouth every 8 (eight) hours as needed (for pain).   Yes [provider]  docusate sodium (COLACE) 100 MG capsule Take 2 capsules (200 mg total) by mouth 2 (two) times daily. Patient not taking: Reported on 12/28/2022 07/24/22   [name], MD  enoxaparin (LOVENOX) 40 MG/0.4ML injection Inject 0.4 mLs (40 mg total) into the skin daily for 28 days. Patient not taking: Reported on 12/28/2022 10/31/22 12/28/22  Cecil Cobbs, PA-C    Current Facility-Administered Medications  Medication Dose Route Frequency Provider Last Rate Last Admin   acetaminophen (TYLENOL) tablet 650 mg  650 mg Oral Q6H PRN Alan Mulder, MD   650 mg at 12/28/22 2239   Or   acetaminophen (TYLENOL) suppository 650 mg  650 mg Rectal Q6H PRN Alan Mulder, MD       atorvastatin (LIPITOR) tablet 40 mg  40 mg Oral QPM Alan Mulder, MD   40 mg at 12/28/22 2237   furosemide (LASIX) tablet 60 mg  60 mg Oral q AM Dorrell, Molly Maduro, MD       mesalamine (PENTASA) CR capsule 1,000 mg  1,000 mg Oral BID Alan Mulder, MD   1,000 mg at 12/28/22 2237   mometasone-formoterol (DULERA) 100-5 MCG/ACT inhaler 2 puff  2 puff Inhalation BID Alan Mulder, MD   2 puff at 12/29/22 0921   ondansetron (ZOFRAN) tablet 4 mg  4 mg Oral Q6H PRN Alan Mulder, MD       Or   ondansetron (ZOFRAN) injection 4 mg  4 mg Intravenous Q6H PRN Dorrell, Robert, MD       oxyCODONE (Oxy IR/ROXICODONE) immediate  release tablet 5 mg  5 mg Oral Q4H PRN Dorrell, Robert, MD       pantoprazole (PROTONIX) EC tablet 40 mg  40 mg Oral BID Alan Mulder, MD   40 mg at 12/28/22 2237    Allergies as of 12/28/2022 - Review Complete 12/28/2022  Allergen Reaction Noted   Ibuprofen Other (See Comments) 07/13/2021   Ace inhibitors Other (See Comments) and Cough 07/13/2021   Breztri aerosphere [budeson-glycopyrrol-formoterol] Hypertension 10/28/2022   Ciprofloxacin Other (See Comments) 08/22/2022   Levofloxacin Other (See Comments) 08/22/2022   Nsaids Nausea And Vomiting and Other (See Comments) 12/30/2020    Family History  Problem Relation Age of Onset   CVA Mother    Lung cancer Father    Colon cancer Neg Hx     Social History   Socioeconomic History  Marital status: Widowed    Spouse name: Not on file   Number of children: Not on file   Years of education: Not on file   Highest education level: Not on file  Occupational History   Not on file  Tobacco Use   Smoking status: Former   Smokeless tobacco: Never  Vaping Use   Vaping status: Never Used  Substance and Sexual Activity   Alcohol use: No    Comment: Quit in 1985   Drug use: No   Sexual activity: Not on file  Other Topics Concern   Not on file  Social History Narrative   Not on file   Social Drivers of Health   Financial Resource Strain: Not on file  Food Insecurity: No Food Insecurity (12/28/2022)   Hunger Vital Sign    Worried About Running Out of Food in the Last Year: Never true    Ran Out of Food in the Last Year: Never true  Transportation Needs: No Transportation Needs (12/28/2022)   PRAPARE - Administrator, Civil Service (Medical): No    Lack of Transportation (Non-Medical): No  Physical Activity: Not on file  Stress: Not on file  Social Connections: Not on file  Intimate Partner Violence: Not At Risk (12/28/2022)   Humiliation, Afraid, Rape, and Kick questionnaire    Fear of Current or Ex-Partner:  No    Emotionally Abused: No    Physically Abused: No    Sexually Abused: No    Review of Systems: As per HPI, all others negative  Physical Exam: Vital signs in last 24 hours: Temp:  [97.6 F (36.4 C)-98.1 F (36.7 C)] 98.1 F (36.7 C) (12/21 0752) Pulse Rate:  [90-117] 107 (12/21 0752) Resp:  [17-18] 18 (12/21 0752) BP: (93-125)/(66-82) 93/78 (12/21 0752) SpO2:  [96 %-100 %] 97 % (12/21 0924) Weight:  [85.3 kg] 85.3 kg (12/20 1533) Last BM Date : 12/28/22 General:   Alert,  Well-developed, well-nourished, pleasant and cooperative in NAD Head:  Normocephalic and atraumatic. Eyes:  Sclera clear, no icterus.   Conjunctiva pale Ears:  Normal auditory acuity. Nose:  No deformity, discharge,  or lesions. Mouth:  No deformity or lesions.  Oropharynx pale and dry Neck:  Supple; no masses or thyromegaly. Lungs:  No visible distress Abdomen:  Soft, nontender and nondistended. No masses, hepatosplenomegaly or hernias noted. Old surgical scars, without guarding, and without rebound.     Msk:  Symmetrical without gross deformities. Normal posture. Pulses:  Normal pulses noted. Extremities:  Without clubbing or edema. Neurologic:  Alert and  oriented x4;  grossly normal neurologically. Skin:  Intact without significant lesions or rashes. Psych:  Alert and cooperative. Normal mood and affect.   Lab Results: Recent Labs    12/28/22 1617 12/29/22 0527  WBC 7.7 8.7  HGB 8.8* 7.1*  HCT 28.1* 23.5*  PLT 234 208   BMET Recent Labs    12/28/22 1617  NA 137  K 4.6  CL 99  CO2 31  GLUCOSE 98  BUN 27*  CREATININE 0.97  CALCIUM 7.7*   LFT Recent Labs    12/28/22 1617  PROT 6.0*  ALBUMIN 2.6*  AST 17  ALT 9  ALKPHOS 75  BILITOT 0.8   PT/INR No results for input(s): "LABPROT", "INR" in the last 72 hours.  Studies/Results: CT ABDOMEN PELVIS W CONTRAST Result Date: 12/28/2022 CLINICAL DATA:  Lower gastrointestinal bleeding. Rectal bleeding. Loose bloody stools. EXAM:  CT ABDOMEN AND PELVIS WITH CONTRAST TECHNIQUE:  Multidetector CT imaging of the abdomen and pelvis was performed using the standard protocol following bolus administration of intravenous contrast. RADIATION DOSE REDUCTION: This exam was performed according to the departmental dose-optimization program which includes automated exposure control, adjustment of the mA and/or kV according to patient size and/or use of iterative reconstruction technique. CONTRAST:  80mL OMNIPAQUE IOHEXOL 300 MG/ML  SOLN COMPARISON:  Abdominal radiographs 11/11/2022. CT abdomen and pelvis 11/03/2022 and 10/18/2020 FINDINGS: Lower chest: Emphysematous changes in the lung bases. Small bilateral pleural effusions with basilar atelectasis. Hepatobiliary: Calcified granulomas in the liver. Surgical absence of the gallbladder. Bile ducts are unremarkable. Pancreas: Cyst at the junction of the body and tail of the pancreas measuring 1.4 cm diameter. No change since previous study. No inflammatory changes suggested around the pancreas. Spleen: Calcified granulomas in the spleen. Adrenals/Urinary Tract: Left adrenal gland nodule, 1.6 cm diameter, 133 Hounsfield units density. No change since prior study dated 10/18/2020. Kidneys are symmetrical with homogeneous nephrograms. No hydronephrosis or hydroureter. Bladder is normal. Stomach/Bowel: Stomach, small bowel, and colon are not abnormally distended. No wall thickening or inflammatory changes. Diverticulosis of the sigmoid colon. No evidence of acute diverticulitis. Appendix is not identified. Vascular/Lymphatic: Aortic atherosclerosis. No enlarged abdominal or pelvic lymph nodes. Reproductive: Status post hysterectomy. No adnexal masses. Other: No abdominal wall hernia or abnormality. No abdominopelvic ascites. Musculoskeletal: Degenerative changes in the spine and hips. Postoperative fixation of the right proximal femur. No acute bony abnormalities. IMPRESSION: 1. No acute process demonstrated in  the abdomen or pelvis. 2. Diverticulosis of the colon without evidence of acute diverticulitis. 3. Small bilateral pleural effusions with basilar atelectasis. 4. 1.6 cm left adrenal gland nodule is unchanged since 10/18/2020. The CT appearance is indeterminate but long-term stability suggests benign etiology. No additional follow-up is indicated. 5. 1.4 cm diameter cyst in the body of the pancreas, unchanged since 10/18/2020. Long-term stability suggests benign etiology. Electronically Signed   By: Burman Nieves M.D.   On: 12/28/2022 19:41   Impression:   Hematochezia, improving, likely diverticular. Acute blood loss anemia. Crohn's disease by report.  Plan:  Diet advance to full liquid diet. Follow CBCs and transfuse as needed. Avoid ASA/NSAIDs for next couple weeks. If rampant rebleeding, would do CT angiogram abd/pelvis for further evaluation. No plans for colonoscopy. [facility] will follow; hopefully ok discharge home in next 1-2 days.   LOS: 1 day   Sincere Liuzzi M  12/29/2022, 9:55 AM  Cell [phone] If no answer or after 5 PM call [phone]

## 2022-12-29 NOTE — Progress Notes (Signed)
[facility] Progress Note Patient: Felicia VAUGH UYQ:[id number] DOB: 1930/06/20 DOA: 12/28/2022  DOS: the patient was seen and examined on 12/29/2022  Brief Hospital Course: PMH of CHF, Crohn's disease, GERD, HTN, AAA present to the hospital with complaints of BRBPR. Found to have a diverticular bleed with anemia. GI consulted. Conservatively managed for now. Assessment and Plan: BRBPR.  Hematochezia. Acute blood loss anemia. Painless blood in the stool. So far no bleeding in the hospital. Hemoglobin continues to drop down likely dilutional in nature. GI consulted. CT abdomen shows no acute abnormality. GI recommending conservative measures for now. Transfuse for hemoglobin less than 7. Initially plan was to switch her to full liquid diet but currently I have placed her back on clear liquid diet.  History of CAD. Patient was on aspirin.  Will discontinue.  Crohn's disease. On Pentasa. Less likely inflammatory activity. Monitor for now.  Chronic respiratory failure. On 4 LPM Monitor.  HLD.  On statin.  Continue.  Chronic diastolic CHF On Lasix. Currently on hold. No volume overload for now.  GERD prevention on PPI. Will continue.   Subjective: No nausea or vomiting.  No fever no chills.  No blood in the stool.  Physical Exam: General: in Mild distress, No Rash Cardiovascular: S1 and S2 Present, No Murmur Respiratory: Good respiratory effort, Bilateral Air entry present. No Crackles, No wheezes Abdomen: Bowel Sound present, No tenderness Extremities: No edema Neuro: Alert and oriented x3, no new focal deficit  Data Reviewed: I have Reviewed nursing notes, Vitals, and Lab results. Since last encounter, pertinent lab results CBC and BMP   . I have ordered test including CBC and BMP  .   Disposition: Status is: Inpatient Remains inpatient appropriate because: Monitor for improvement in H&H.  SCDs Start: 12/28/22 2147   Family Communication: No one at  bedside Level of care: Med-Surg   Vitals:   12/29/22 1143 12/29/22 1309 12/29/22 1336 12/29/22 1619  BP: 105/65 100/69 (!) 105/52 122/72  Pulse: (!) 53 98 84 92  Resp: 20 20 16 16   Temp: 97.9 F (36.6 C) 97.9 F (36.6 C) 98 F (36.7 C) 97.6 F (36.4 C)  TempSrc: Oral Oral Oral Oral  SpO2: 99% 100% 94% 99%  Weight:      Height:         Author: Lynden Oxford, MD 12/29/2022 7:21 PM  Please look on [email or web address] to find out who is on call.

## 2022-12-29 NOTE — Progress Notes (Signed)
PT Cancellation Note  Patient Details Name: Felicia Benson MRN: [id number] DOB: October 02, 1930   Cancelled Treatment:     Pt reports she came in from [facility] and was receiving rehab.  Pt would like to continue rehab efforts however declines to participate today.  Pt was receiving PRBCs and requested a day of rest.  Pt stated she would be agreeable tomorrow.   Janan Halter Payson 12/29/2022, 4:21 PM Paulino Door, DPT Physical Therapist Acute Rehabilitation Services Office: [phone]

## 2022-12-30 ENCOUNTER — Inpatient Hospital Stay (HOSPITAL_COMMUNITY): Payer: Medicare Other

## 2022-12-30 DIAGNOSIS — K5731 Diverticulosis of large intestine without perforation or abscess with bleeding: Secondary | ICD-10-CM | POA: Diagnosis not present

## 2022-12-30 LAB — BASIC METABOLIC PANEL
Anion gap: 7 (ref 5–15)
BUN: 39 mg/dL — ABNORMAL HIGH (ref 8–23)
CO2: 27 mmol/L (ref 22–32)
Calcium: 7.6 mg/dL — ABNORMAL LOW (ref 8.9–10.3)
Chloride: 101 mmol/L (ref 98–111)
Creatinine, Ser: 1.17 mg/dL — ABNORMAL HIGH (ref 0.44–1.00)
GFR, Estimated: 44 mL/min — ABNORMAL LOW (ref >60)
Glucose, Bld: 106 mg/dL — ABNORMAL HIGH (ref 70–99)
Potassium: 4.3 mmol/L (ref 3.5–5.1)
Sodium: 135 mmol/L (ref 135–145)

## 2022-12-30 LAB — CBC WITH DIFFERENTIAL/PLATELET
Abs Immature Granulocytes: 0.06 10*3/uL (ref 0.00–0.07)
Basophils Absolute: 0 10*3/uL (ref 0.0–0.1)
Basophils Relative: 0 %
Eosinophils Absolute: 0.2 10*3/uL (ref 0.0–0.5)
Eosinophils Relative: 1 %
HCT: 23.4 % — ABNORMAL LOW (ref 36.0–46.0)
Hemoglobin: 7.7 g/dL — ABNORMAL LOW (ref 12.0–15.0)
Immature Granulocytes: 1 %
Lymphocytes Relative: 16 %
Lymphs Abs: 1.7 10*3/uL (ref 0.7–4.0)
MCH: 30 pg (ref 26.0–34.0)
MCHC: 32.9 g/dL (ref 30.0–36.0)
MCV: 91.1 fL (ref 80.0–100.0)
Monocytes Absolute: 1.2 10*3/uL — ABNORMAL HIGH (ref 0.1–1.0)
Monocytes Relative: 11 %
Neutro Abs: 8.1 10*3/uL — ABNORMAL HIGH (ref 1.7–7.7)
Neutrophils Relative %: 71 %
Platelets: 162 10*3/uL (ref 150–400)
RBC: 2.57 MIL/uL — ABNORMAL LOW (ref 3.87–5.11)
RDW: 20 % — ABNORMAL HIGH (ref 11.5–15.5)
WBC: 11.2 10*3/uL — ABNORMAL HIGH (ref 4.0–10.5)
nRBC: 0 % (ref 0.0–0.2)

## 2022-12-30 LAB — MAGNESIUM: Magnesium: 1.8 mg/dL (ref 1.7–2.4)

## 2022-12-30 LAB — CBC
HCT: 21.8 % — ABNORMAL LOW (ref 36.0–46.0)
Hemoglobin: 7 g/dL — ABNORMAL LOW (ref 12.0–15.0)
MCH: 30.6 pg (ref 26.0–34.0)
MCHC: 32.1 g/dL (ref 30.0–36.0)
MCV: 95.2 fL (ref 80.0–100.0)
Platelets: 187 10*3/uL (ref 150–400)
RBC: 2.29 MIL/uL — ABNORMAL LOW (ref 3.87–5.11)
RDW: 20.7 % — ABNORMAL HIGH (ref 11.5–15.5)
WBC: 9.9 10*3/uL (ref 4.0–10.5)
nRBC: 0.3 % — ABNORMAL HIGH (ref 0.0–0.2)

## 2022-12-30 LAB — PREPARE RBC (CROSSMATCH)

## 2022-12-30 MED ORDER — SODIUM CHLORIDE 0.9% IV SOLUTION: Freq: Once | INTRAVENOUS | Status: AC

## 2022-12-30 MED ORDER — DEXTROSE IN LACTATED RINGERS 5 % IV SOLN
INTRAVENOUS | Status: AC
Start: 2022-12-30 — End: 2022-12-31
  Filled 2022-12-30: qty 1000

## 2022-12-30 MED ORDER — SODIUM CHLORIDE (PF) 0.9 % IJ SOLN
INTRAMUSCULAR | Status: AC
  Filled 2022-12-30: qty 50

## 2022-12-30 MED ORDER — IOHEXOL 350 MG/ML SOLN
80.0000 mL | Freq: Once | INTRAVENOUS | Status: AC | PRN
  Administered 2022-12-30: 80 mL via INTRAVENOUS

## 2022-12-30 NOTE — Progress Notes (Signed)
[facility] Progress Note Patient: Felicia Benson BJY:[id number] DOB: 08-30-1930 DOA: 12/28/2022  DOS: the patient was seen and examined on 12/30/2022  Brief Hospital Course: PMH of CHF, Crohn's disease, GERD, HTN, AAA present to the hospital with complaints of BRBPR. Found to have a diverticular bleed with anemia. GI consulted. Conservatively managed for now. Assessment and Plan: BRBPR.  Hematochezia. Acute blood loss anemia. Painless blood in the stool. So far no bleeding in the hospital. Hemoglobin continues to drop down likely dilutional in nature. [facility] consulted. CT abdomen shows no acute abnormality. Transfuse for hemoglobin less than 7. Hemoglobin dropped after transfusion and patient continues to report BRBPR therefore stat CT angio was ordered. Patient will remain n.p.o. per GI.  History of CAD. Patient was on aspirin.  Will discontinue.  Acute blood loss anemia.  Likely on chronic anemia. Baseline hemoglobin around 9. On admission hemoglobin was 8.8 and dropped down to 7.1 and then 6.6. Received 1 PRBC transfusion and repeat hemoglobin was 7.0. Currently receiving 2 PRBC transfusion. Will monitor CBC.  Crohn's disease. On Pentasa. Less likely inflammatory activity. Monitor for now.  Chronic respiratory failure. On 4 LPM Monitor.  Mild renal insufficiency. Baseline serum creatinine on admission was 0.9. Worsening to 1.17 with BUN worsening as well. Likely in the setting of prerenal etiology. Will be providing IV hydration. Monitor for worsening respiratory status.  HLD. On statin.  Continue.  Chronic diastolic CHF On Lasix. Currently on hold. No volume overload for now.  GERD prevention on PPI. Will continue.   Subjective: No nausea no vomiting no fever no chills.  Ports to [employer].  Physical Exam: General: in Mild distress, No Rash Cardiovascular: S1 and S2 Present, No Murmur Respiratory: Good respiratory effort, Bilateral Air entry  present. No Crackles, No wheezes Abdomen: Bowel Sound present, No tenderness Extremities: No edema Neuro: Alert and oriented x3, no new focal deficit  Data Reviewed: I have Reviewed nursing notes, Vitals, and Lab results. Since last encounter, pertinent lab results CBC and BMP   . I have ordered test including CBC and BMP  .   Disposition: Status is: Inpatient Remains inpatient appropriate because: Monitor for stability of H&H  SCDs Start: 12/28/22 2147   Family Communication: No one at bedside Level of care: Med-Surg   Vitals:   12/30/22 0930 12/30/22 0947 12/30/22 1236 12/30/22 1333  BP: 102/67 100/60 125/74 122/72  Pulse: 100 100 92 99  Resp: 16 18 16 18   Temp: 98.2 F (36.8 C) 98.5 F (36.9 C) 98.7 F (37.1 C) 97.6 F (36.4 C)  TempSrc: Oral Oral Oral Oral  SpO2: 97% 100% 98% 97%  Weight:      Height:         Author: Lynden Oxford, MD 12/30/2022 7:53 PM  Please look on [email or web address] to find out who is on call.

## 2022-12-30 NOTE — Progress Notes (Signed)
Subjective: Having blood in stool intermittently since last night, large frankly blood stools x 2 this morning. Some cramps but no abdominal pain.  Objective: Vital signs in last 24 hours: Temp:  [97.6 F (36.4 C)-98.8 F (37.1 C)] 98.5 F (36.9 C) (12/22 0947) Pulse Rate:  [84-105] 100 (12/22 0947) Resp:  [16-20] 18 (12/22 0947) BP: (100-122)/(52-72) 100/60 (12/22 0947) SpO2:  [94 %-100 %] 100 % (12/22 0947) Weight change:  Last BM Date : 12/30/22  PE: GEN:  NAD ABD:  Soft, non-tender NEURO:  Alert LUNGS:  No visible distress  Lab Results: CBC    Component Value Date/Time   WBC 9.9 12/30/2022 0526   RBC 2.29 (L) 12/30/2022 0526   HGB 7.0 (L) 12/30/2022 0526   HCT 21.8 (L) 12/30/2022 0526   PLT 187 12/30/2022 0526   MCV 95.2 12/30/2022 0526   MCH 30.6 12/30/2022 0526   MCHC 32.1 12/30/2022 0526   RDW 20.7 (H) 12/30/2022 0526   LYMPHSABS 1.4 12/28/2022 1617   MONOABS 0.8 12/28/2022 1617   EOSABS 0.1 12/28/2022 1617   BASOSABS 0.0 12/28/2022 1617  CMP     Component Value Date/Time   NA 135 12/30/2022 0526   K 4.3 12/30/2022 0526   CL 101 12/30/2022 0526   CO2 27 12/30/2022 0526   GLUCOSE 106 (H) 12/30/2022 0526   BUN 39 (H) 12/30/2022 0526   CREATININE 1.17 (H) 12/30/2022 0526   CALCIUM 7.6 (L) 12/30/2022 0526   PROT 6.0 (L) 12/28/2022 1617   ALBUMIN 2.6 (L) 12/28/2022 1617   AST 17 12/28/2022 1617   ALT 9 12/28/2022 1617   ALKPHOS 75 12/28/2022 1617   BILITOT 0.8 12/28/2022 1617   GFRNONAA 44 (L) 12/30/2022 0526   Assessment:   Hematochezia. Acute blood loss anemia.  Plan:   STAT CT angiogram today.   NPO. Follow CBCs.  Transfuse as needed. [facility] will follow.   Freddy Jaksch 12/30/2022, 12:00 PM   Cell [phone] If no answer or after 5 PM call [phone]

## 2022-12-30 NOTE — Progress Notes (Signed)
PT Cancellation Note  Patient Details Name: Felicia Benson MRN: [id number] DOB: 08/12/1930   Cancelled Treatment:    Reason Eval/Treat Not Completed: Patient declined, no reason specified; pt adamantly refuses any activity today    [facility] 12/30/2022, 10:57 AM

## 2022-12-30 NOTE — Plan of Care (Signed)
?  Problem: Clinical Measurements: ?Goal: Will remain free from infection ?Outcome: Progressing ?  ?

## 2022-12-31 DIAGNOSIS — K5731 Diverticulosis of large intestine without perforation or abscess with bleeding: Secondary | ICD-10-CM | POA: Diagnosis not present

## 2022-12-31 LAB — BASIC METABOLIC PANEL
Anion gap: 6 (ref 5–15)
BUN: 36 mg/dL — ABNORMAL HIGH (ref 8–23)
CO2: 27 mmol/L (ref 22–32)
Calcium: 7.3 mg/dL — ABNORMAL LOW (ref 8.9–10.3)
Chloride: 100 mmol/L (ref 98–111)
Creatinine, Ser: 0.85 mg/dL (ref 0.44–1.00)
GFR, Estimated: >60 mL/min (ref >60)
Glucose, Bld: 112 mg/dL — ABNORMAL HIGH (ref 70–99)
Potassium: 3.2 mmol/L — ABNORMAL LOW (ref 3.5–5.1)
Sodium: 133 mmol/L — ABNORMAL LOW (ref 135–145)

## 2022-12-31 LAB — CBC
HCT: 20.9 % — ABNORMAL LOW (ref 36.0–46.0)
Hemoglobin: 6.9 g/dL — CL (ref 12.0–15.0)
MCH: 30 pg (ref 26.0–34.0)
MCHC: 33 g/dL (ref 30.0–36.0)
MCV: 90.9 fL (ref 80.0–100.0)
Platelets: 154 10*3/uL (ref 150–400)
RBC: 2.3 MIL/uL — ABNORMAL LOW (ref 3.87–5.11)
RDW: 19.8 % — ABNORMAL HIGH (ref 11.5–15.5)
WBC: 10 10*3/uL (ref 4.0–10.5)
nRBC: 0 % (ref 0.0–0.2)

## 2022-12-31 LAB — PREPARE RBC (CROSSMATCH)

## 2022-12-31 LAB — MAGNESIUM: Magnesium: 1.7 mg/dL (ref 1.7–2.4)

## 2022-12-31 MED ORDER — MAGNESIUM SULFATE 2 GM/50ML IV SOLN
2.0000 g | Freq: Once | INTRAVENOUS | Status: AC
  Administered 2022-12-31: 2 g via INTRAVENOUS
  Filled 2022-12-31: qty 50

## 2022-12-31 MED ORDER — MELATONIN 3 MG PO TABS
3.0000 mg | ORAL_TABLET | Freq: Every day | ORAL | Status: DC
Start: 2022-12-31 — End: 2023-01-03
  Administered 2022-12-31 – 2023-01-02 (×3): 3 mg via ORAL
  Filled 2022-12-31 (×3): qty 1

## 2022-12-31 MED ORDER — SODIUM CHLORIDE 0.9% IV SOLUTION: Freq: Once | INTRAVENOUS | Status: AC

## 2022-12-31 MED ORDER — POTASSIUM CHLORIDE 10 MEQ/100ML IV SOLN
10.0000 meq | INTRAVENOUS | Status: AC
  Administered 2022-12-31 (×4): 10 meq via INTRAVENOUS
  Filled 2022-12-31: qty 100

## 2022-12-31 MED ORDER — ALBUTEROL SULFATE (2.5 MG/3ML) 0.083% IN NEBU
2.5000 mg | INHALATION_SOLUTION | Freq: Four times a day (QID) | RESPIRATORY_TRACT | Status: DC | PRN
  Administered 2023-01-01: 2.5 mg via RESPIRATORY_TRACT
  Filled 2022-12-31: qty 3

## 2022-12-31 MED ORDER — MELATONIN 5 MG PO TABS: 5.0000 mg | ORAL_TABLET | Freq: Every evening | ORAL | Status: DC | PRN

## 2022-12-31 MED ORDER — ACETAMINOPHEN 500 MG PO TABS
1000.0000 mg | ORAL_TABLET | Freq: Every day | ORAL | Status: DC
  Administered 2022-12-31 – 2023-01-02 (×3): 1000 mg via ORAL
  Filled 2022-12-31 (×3): qty 2

## 2022-12-31 NOTE — Plan of Care (Signed)
  Problem: Education: Goal: Knowledge of General Education information will improve Description: Including pain rating scale, medication(s)/side effects and non-pharmacologic comfort measures Outcome: Progressing   Problem: Health Behavior/Discharge Planning: Goal: Ability to manage health-related needs will improve Outcome: Progressing   Problem: Coping: Goal: Level of anxiety will decrease Outcome: Progressing   Problem: Pain Management: Goal: General experience of comfort will improve Outcome: Progressing

## 2022-12-31 NOTE — Progress Notes (Signed)
PT Cancellation Note  Patient Details Name: Felicia Benson MRN: [id number] DOB: 1930/06/01   Cancelled Treatment:    Reason Eval/Treat Not Completed: Medical issues which prohibited therapy  Receiving blood products currently. Will check back another time.   Blanchard Kelch PT Acute Rehabilitation Services Office [phone] Weekend [phone]  Rada Hay 12/31/2022, 10:39 AM

## 2022-12-31 NOTE — Progress Notes (Signed)
PROGRESS NOTE  Felicia Benson  WGN:[id number] DOB: 1930-04-19 DOA: 12/28/2022 PCP: [name], MD   Brief Narrative:  Patient is a 89 year old female with history of osteoarthritis, Crohn's disease, GERD, hypertension, abdominal aortic aneurysm who presented with complaint of rectal bleed.  Suspected to have diverticular bleed.  Hospital course remarkable for persistent acute blood loss anemia requiring blood transfusion.  GI following.  Currently on conservative management.  Assessment & Plan:  Principal Problem:   Diverticular hemorrhage  Hematochezia/diverticular bleed: Presented with painless rectal bleeding.Hospital course remarkable for persistent acute blood loss anemia requiring blood transfusion.  GI following.  Currently on conservative management.  CT abdomen did not show any acute findings.  CT angiogram bleed done on 12/22 did not show any active bleeding.  Showed severe sigmoid diverticulosis.  Acute blood loss anemia: Given 2 units of blood transfusion during his hospitalization.  Continue to transfuse if hemoglobin drops less than 7.  Expect spontaneous stabilization of the hemoglobin after diverticular bleeding stops.  History of coronary artery disease: Taking aspirin at home.  Will hold.  No anginal symptoms  Crohn's disease: On Pentasa.  Follow-up with gastroenterology as an outpatient.  COPD/Chronic hypoxic respiratory  failure: On  4 L of oxygen at home.  Currently on the same.  Currently not in exacerbation  AKI: Resolved  Hypokalemia: Supplemented with potassium.  Also given magnesium supplementation  Hyperlipidemia: On statin  Chronic diastolic CHF: On Lasix at home.  Currently on hold.  Currently euvolemic  GERD: Continue PPI  Recent right hip fracture: Currently if residing at a skilled nursing facility because of recent hip fracture.  Expected discharge to SNF when ready        Pressure Injury 11/12/22 Sacrum Mid Stage 2 -  Partial thickness  loss of dermis presenting as a shallow open injury with a red, pink wound bed without slough. (Active)  11/12/22 1000  Location: Sacrum  Location Orientation: Mid  Staging: Stage 2 -  Partial thickness loss of dermis presenting as a shallow open injury with a red, pink wound bed without slough.  Wound Description (Comments):   Present on Admission: No  Dressing Type None 12/31/22 0749    DVT prophylaxis:SCDs Start: 12/28/22 2147     Code Status: Limited: Do not attempt resuscitation (DNR) -DNR-LIMITED -Do Not Intubate/DNI   Family Communication: None at the bedside  Patient status:Inpatient  Patient is from :home  Anticipated discharge to:snf  Estimated DC date: After stabilization of hemoglobin   Consultants: GI  Procedures: None  Antimicrobials:  Anti-infectives (From admission, onward)    None       Subjective: Patient seen and examined at bedside today.  Hemodynamically stable.  Lying in bed.  Had a small smear of bloody bowel movement last evening.  No rectal bleeding since yesterday.  Denies any abdominal pain.  No shortness of breath or cough  Objective: Vitals:   12/30/22 2029 12/30/22 2117 12/31/22 0507 12/31/22 0623  BP:  102/64 112/62   Pulse:  (!) 103 96   Resp:  17 16   Temp:  98.3 F (36.8 C) 97.7 F (36.5 C)   TempSrc:  Oral Oral   SpO2: 96% 94% 95% 95%  Weight:      Height:        Intake/Output Summary (Last 24 hours) at 12/31/2022 7846 Last data filed at 12/31/2022 0600 Gross per 24 hour  Intake 2352.81 ml  Output 10 ml  Net 2342.81 ml   [employer]  12/28/22 1533  Weight: 85.3 kg    Examination:  General exam: Overall comfortable, not in distress, pleasant elderly female HEENT: PERRL Respiratory system:  no wheezes or crackles  Cardiovascular system: S1 & S2 heard, RRR.  Gastrointestinal system: Abdomen is nondistended, soft and nontender. Central nervous system: Alert and oriented Extremities: No edema, no clubbing ,no  cyanosis Skin: No rashes, no ulcers,no icterus     Data Reviewed: I have personally reviewed following labs and imaging studies  CBC: Recent Labs  Lab 12/28/22 1617 12/29/22 0527 12/29/22 1017 12/30/22 0526 12/30/22 2032 12/31/22 0452  WBC 7.7 8.7 8.5 9.9 11.2* 10.0  NEUTROABS 5.3  --   --   --  8.1*  --   HGB 8.8* 7.1* 6.6* 7.0* 7.7* 6.9*  HCT 28.1* 23.5* 21.1* 21.8* 23.4* 20.9*  MCV 99.3 101.7* 99.1 95.2 91.1 90.9  PLT 234 208 181 187 162 154   Basic Metabolic Panel: Recent Labs  Lab 12/28/22 1617 12/30/22 0526 12/31/22 0452  NA 137 135 133*  K 4.6 4.3 3.2*  CL 99 101 100  CO2 31 27 27   GLUCOSE 98 106* 112*  BUN 27* 39* 36*  CREATININE 0.97 1.17* 0.85  CALCIUM 7.7* 7.6* 7.3*  MG  --  1.8 1.7     Recent Results (from the past 240 hours)  Gastrointestinal Panel by PCR , Stool     Status: None   Collection Time: 12/28/22  1:39 AM   Specimen: Rectum; Stool  Result Value Ref Range Status   Campylobacter species NOT DETECTED NOT DETECTED Final   Plesimonas shigelloides NOT DETECTED NOT DETECTED Final   Salmonella species NOT DETECTED NOT DETECTED Final   Yersinia enterocolitica NOT DETECTED NOT DETECTED Final   Vibrio species NOT DETECTED NOT DETECTED Final   Vibrio cholerae NOT DETECTED NOT DETECTED Final   Enteroaggregative E coli (EAEC) NOT DETECTED NOT DETECTED Final   Enteropathogenic E coli (EPEC) NOT DETECTED NOT DETECTED Final   Enterotoxigenic E coli (ETEC) NOT DETECTED NOT DETECTED Final   Shiga like toxin producing E coli (STEC) NOT DETECTED NOT DETECTED Final   Shigella/Enteroinvasive E coli (EIEC) NOT DETECTED NOT DETECTED Final   Cryptosporidium NOT DETECTED NOT DETECTED Final   Cyclospora cayetanensis NOT DETECTED NOT DETECTED Final   Entamoeba histolytica NOT DETECTED NOT DETECTED Final   Giardia lamblia NOT DETECTED NOT DETECTED Final   Adenovirus F40/41 NOT DETECTED NOT DETECTED Final   Astrovirus NOT DETECTED NOT DETECTED Final   Norovirus  GI/GII NOT DETECTED NOT DETECTED Final   Rotavirus A NOT DETECTED NOT DETECTED Final   Sapovirus (I, II, IV, and V) NOT DETECTED NOT DETECTED Final    Comment: Performed at [facility], [address] [place], [place] [address]     Radiology Studies: CT ANGIO GI BLEED Result Date: 12/30/2022 CLINICAL DATA:  Blood in stool.  Lower GI bleed. EXAM: CTA ABDOMEN AND PELVIS WITHOUT AND WITH CONTRAST TECHNIQUE: Multidetector CT imaging of the abdomen and pelvis was performed using the standard protocol during bolus administration of intravenous contrast. Multiplanar reconstructed images and MIPs were obtained and reviewed to evaluate the vascular anatomy. RADIATION DOSE REDUCTION: This exam was performed according to the departmental dose-optimization program which includes automated exposure control, adjustment of the mA and/or kV according to patient size and/or use of iterative reconstruction technique. CONTRAST:  80mL OMNIPAQUE IOHEXOL 350 MG/ML SOLN COMPARISON:  CT abdomen pelvis dated 12/28/2022. FINDINGS: VASCULAR Aorta: Advanced atherosclerotic calcification of the abdominal aorta. No  aneurysmal dilatation or dissection. No periaortic fluid collection. Celiac: Atherosclerotic calcification of the origin of the celiac trunk. The celiac artery and its major branches are patent. SMA: Atherosclerotic calcification of the origin of the SMA. The SMA is patent. Renals: There is atherosclerotic calcification of the origins of the renal arteries. The renal arteries are patent. IMA: The IMA is patent. Inflow: Moderate atherosclerotic calcification of the iliac arteries. The iliac arteries are patent. No aneurysmal dilatation or dissection. Proximal Outflow: The visualized proximal outflow is patent. Veins: The IVC is unremarkable.  No portal venous gas. Review of the MIP images confirms the above findings. NON-VASCULAR Lower chest: There are bibasilar subpleural atelectasis/scarring. There is coronary  vascular calcification. Partially visualized cardiac pacemaker wires. No intra-abdominal free air or free fluid. Hepatobiliary: Subcentimeter hypodense focus in the right lobe of the liver is too small to characterize. There is mild dilatation with cholecystectomy. No retained calcified stone noted in the central CBD. Pancreas: Unremarkable. No pancreatic ductal dilatation or surrounding inflammatory changes. Spleen: Small calcified splenic granuloma. Adrenals/Urinary Tract: The right adrenal gland is unremarkable. There is a 15 mm indeterminate left adrenal nodule similar to prior CT, favored to represent an adenoma. There is no hydronephrosis or nephrolithiasis on either side. There is symmetric enhancement of the renal parenchyma. The visualized ureters and urinary bladder appear unremarkable. Stomach/Bowel: There is severe sigmoid diverticulosis without active inflammatory changes. There is no bowel obstruction. Evaluation for active GI bleed is very limited, almost nondiagnostic, due to presence of oral contrast within the colon. No definite large active bleed. The appendix is not visualized with certainty. No inflammatory changes identified in the right lower quadrant. Lymphatic: No adenopathy. Reproductive: Hysterectomy. Other: None Musculoskeletal: Osteopenia with degenerative changes spine. Status post ORIF of right femoral neck fracture. IMPRESSION: 1. Very limited, almost nondiagnostic evaluation for active GI bleed due to presence of oral contrast in the colon. 2. Severe sigmoid diverticulosis. No bowel obstruction. 3.  Aortic Atherosclerosis (ICD10-I70.0). Electronically Signed   By: Elgie Collard M.D.   On: 12/30/2022 17:08    Scheduled Meds:  sodium chloride   Intravenous Once   atorvastatin  40 mg Oral QPM   mesalamine  1,000 mg Oral BID   mometasone-formoterol  2 puff Inhalation BID   pantoprazole  40 mg Oral BID   Continuous Infusions:  dextrose 5% lactated ringers 75 mL/hr at  12/30/22 1500   potassium chloride       LOS: 3 days   Burnadette Pop, MD [facility] P12/23/2024, 8:22 AM

## 2022-12-31 NOTE — Progress Notes (Signed)
[facility] Gastroenterology Progress Note  Felicia Benson 89 y.o. 23-Jan-1930  CC: Rectal bleeding   Subjective: Patient seen and examined at bedside.  Still having small amount of rectal bleeding.  Bleeding has slowed down according to patient.  ROS : Afebrile, negative for nausea and vomiting   Objective: Vital signs in last 24 hours: Vitals:   12/31/22 1020 12/31/22 1035  BP: 105/62 (!) 105/52  Pulse: 99 96  Resp: 16 15  Temp: 98.3 F (36.8 C) 98.2 F (36.8 C)  SpO2: 95% 96%    Physical Exam:  General:  Alert, cooperative, no distress, appears stated age  Head:  Normocephalic, without obvious abnormality, atraumatic  Eyes:  , EOM's intact,   Lungs:   No visible respiratory distress  Heart:  Regular rate and rhythm, S1, S2 normal  Abdomen:   Soft, non-tender, bowel sounds active all four quadrants,  no masses,    Alert and oriented x 3       Lab Results: Recent Labs    12/30/22 0526 12/31/22 0452  NA 135 133*  K 4.3 3.2*  CL 101 100  CO2 27 27  GLUCOSE 106* 112*  BUN 39* 36*  CREATININE 1.17* 0.85  CALCIUM 7.6* 7.3*  MG 1.8 1.7   Recent Labs    12/28/22 1617  AST 17  ALT 9  ALKPHOS 75  BILITOT 0.8  PROT 6.0*  ALBUMIN 2.6*   Recent Labs    12/28/22 1617 12/29/22 0527 12/30/22 2032 12/31/22 0452  WBC 7.7   < > 11.2* 10.0  NEUTROABS 5.3  --  8.1*  --   HGB 8.8*   < > 7.7* 6.9*  HCT 28.1*   < > 23.4* 20.9*  MCV 99.3   < > 91.1 90.9  PLT 234   < > 162 154   < > = values in this interval not displayed.   No results for input(s): "LABPROT", "INR" in the last 72 hours.    Assessment/Plan: -Rectal bleeding.  Likely diverticular in nature.  CT angio was nondiagnostic because of oral contrast in the colon but no obvious bleeding was seen. -Acute blood loss anemia.  Status post units of transfusion. -History of Crohn's disease.  Currently on Pentasa. -History of COPD.  Recommendations --------------------------- -Patient's hemoglobin dropped to  6.9 today. -Recommend to transfuse to keep hemoglobin more than 7.  -If ongoing bleeding or drop in hemoglobin, may need colonoscopy. -GI will follow.   Kathi Der MD, FACP 12/31/2022, 11:10 AM  Contact #  [phone]

## 2023-01-01 DIAGNOSIS — K5731 Diverticulosis of large intestine without perforation or abscess with bleeding: Secondary | ICD-10-CM | POA: Diagnosis not present

## 2023-01-01 LAB — BPAM RBC
Blood Product Expiration Date: 202501192359
Blood Product Expiration Date: 202501182359
ISSUE DATE / TIME: 202412220923
ISSUE DATE / TIME: 202412231016
ISSUE DATE / TIME: 202501192359
ISSUE DATE / TIME: 202412211200
Unit Type and Rh: 202501192359
Unit Type and Rh: 202501192359
Unit Type and Rh: 6200
Unit Type and Rh: 6200
Unit Type and Rh: 6200

## 2023-01-01 LAB — BASIC METABOLIC PANEL
Anion gap: 6 (ref 5–15)
BUN: 27 mg/dL — ABNORMAL HIGH (ref 8–23)
CO2: 27 mmol/L (ref 22–32)
Calcium: 7.8 mg/dL — ABNORMAL LOW (ref 8.9–10.3)
Chloride: 103 mmol/L (ref 98–111)
Creatinine, Ser: 1.03 mg/dL — ABNORMAL HIGH (ref 0.44–1.00)
GFR, Estimated: 52 mL/min — ABNORMAL LOW (ref >60)
Glucose, Bld: 114 mg/dL — ABNORMAL HIGH (ref 70–99)
Potassium: 4 mmol/L (ref 3.5–5.1)
Sodium: 136 mmol/L (ref 135–145)

## 2023-01-01 LAB — TYPE AND SCREEN
ABO/RH(D): A POS
Antibody Screen: NEGATIVE
Unit division: 0
Unit division: 0
Unit division: 0

## 2023-01-01 LAB — CBC
HCT: 26.9 % — ABNORMAL LOW (ref 36.0–46.0)
Hemoglobin: 8.4 g/dL — ABNORMAL LOW (ref 12.0–15.0)
MCH: 29.3 pg (ref 26.0–34.0)
MCHC: 31.2 g/dL (ref 30.0–36.0)
MCV: 93.7 fL (ref 80.0–100.0)
Platelets: 168 10*3/uL (ref 150–400)
RBC: 2.87 MIL/uL — ABNORMAL LOW (ref 3.87–5.11)
RDW: 19.1 % — ABNORMAL HIGH (ref 11.5–15.5)
WBC: 13.1 10*3/uL — ABNORMAL HIGH (ref 4.0–10.5)
nRBC: 0 % (ref 0.0–0.2)

## 2023-01-01 LAB — CALPROTECTIN, FECAL: Calprotectin, Fecal: 147 ug/g — ABNORMAL HIGH (ref 0–120)

## 2023-01-01 LAB — MAGNESIUM: Magnesium: 2.1 mg/dL (ref 1.7–2.4)

## 2023-01-01 MED ORDER — GUAIFENESIN ER 600 MG PO TB12
600.0000 mg | ORAL_TABLET | Freq: Two times a day (BID) | ORAL | Status: DC
  Administered 2023-01-01 – 2023-01-02 (×2): 600 mg via ORAL
  Filled 2023-01-01 (×2): qty 1

## 2023-01-01 MED ORDER — IPRATROPIUM-ALBUTEROL 0.5-2.5 (3) MG/3ML IN SOLN
3.0000 mL | Freq: Four times a day (QID) | RESPIRATORY_TRACT | Status: DC | PRN
  Administered 2023-01-01: 3 mL via RESPIRATORY_TRACT
  Filled 2023-01-01: qty 3

## 2023-01-01 MED ORDER — FUROSEMIDE 40 MG PO TABS
60.0000 mg | ORAL_TABLET | Freq: Every morning | ORAL | Status: DC
  Administered 2023-01-02: 60 mg via ORAL
  Filled 2023-01-01: qty 1

## 2023-01-01 MED ORDER — PROCHLORPERAZINE EDISYLATE 10 MG/2ML IJ SOLN
5.0000 mg | Freq: Once | INTRAMUSCULAR | Status: AC | PRN
  Administered 2023-01-01: 5 mg via INTRAVENOUS
  Filled 2023-01-01: qty 2

## 2023-01-01 NOTE — Progress Notes (Signed)
PROGRESS NOTE  Felicia Benson  ZOX:[id number] DOB: [birth year] DOA: 12/28/2022 PCP: [name], MD   Brief Narrative:  Patient is a 89 year old female with history of osteoarthritis, Crohn's disease, GERD, hypertension, abdominal aortic aneurysm who presented with complaint of rectal bleed.  Suspected to have diverticular bleed.  Hospital course remarkable for persistent acute blood loss anemia requiring blood transfusion.  GI following.  Currently on conservative management.  Plan for discharge tomorrow if hemoglobin remains stable and no further episodes of hematochezia  Assessment & Plan:  Principal Problem:   Diverticular hemorrhage  Hematochezia/diverticular bleed: Presented with painless rectal bleeding.Hospital course remarkable for persistent acute blood loss anemia requiring blood transfusion.  GI following.  Currently on conservative management.  CT abdomen did not show any acute findings.  CT angiogram bleed done on 12/22 did not show any active bleeding.  Showed severe sigmoid diverticulosis. Yesterday afternoon she had a big bowel movement with hematochezia.  Diet advanced to soft  Acute blood loss anemia: Given 2 units of blood transfusion during his hospitalization.  Continue to transfuse if hemoglobin drops less than 7.  Expect spontaneous stabilization of the hemoglobin after diverticular bleeding stops.  Hemoglobin stable in the range of 8 today  History of coronary artery disease: Taking aspirin at home.  Will hold.  No anginal symptoms  Crohn's disease: On Pentasa.  Follow-up with gastroenterology as an outpatient.Follows with Dr Bosie Clos  COPD/Chronic hypoxic respiratory  failure: On  4 L of oxygen at home.  Currently on the same.  Currently not in exacerbation  AKI: Resolved  Hypokalemia: Supplemented and corrected   Hyperlipidemia: On statin  Chronic diastolic CHF: On Lasix at home.  Currently on hold.  Currently euvolemic  GERD: Continue PPI  Recent  right hip fracture: Currently if residing at a skilled nursing facility because of recent hip fracture.  Expected discharge to SNF likely tomorrow, TOC alerted        Pressure Injury 11/12/22 Sacrum Mid Stage 2 -  Partial thickness loss of dermis presenting as a shallow open injury with a red, pink wound bed without slough. (Active)  11/12/22 1000  Location: Sacrum  Location Orientation: Mid  Staging: Stage 2 -  Partial thickness loss of dermis presenting as a shallow open injury with a red, pink wound bed without slough.  Wound Description (Comments):   Present on Admission: No  Dressing Type None 01/01/23 0758    DVT prophylaxis:SCDs Start: 12/28/22 2147     Code Status: Limited: Do not attempt resuscitation (DNR) -DNR-LIMITED -Do Not Intubate/DNI   Family Communication: called daughter Felicia Benson for update on 12/24, call not received  Patient status:Inpatient  Patient is from :home  Anticipated discharge to:snf  Estimated DC date: After stabilization of hemoglobin/no further episode of hematochezia, likely tomorrow   Consultants: GI  Procedures: None  Antimicrobials:  Anti-infectives (From admission, onward)    None       Subjective: Patient is seen examined at bedside today.  Hemodynamically stable.  No bloody bowel movement today.  She had 2 episodes of hematochezia yesterday.  Remains comfortable.  Denies any abdominal pain.  Objective: Vitals:   12/31/22 1330 12/31/22 1959 01/01/23 0537 01/01/23 0835  BP: (!) 114/56 118/73 117/64   Pulse: 96 (!) 103 (!) 110   Resp: 20 18 20    Temp: (!) 97.4 F (36.3 C) 98.2 F (36.8 C) 98.4 F (36.9 C)   TempSrc: Oral Oral Oral   SpO2: 95% 94% 92% 90%  Weight:  Height:        Intake/Output Summary (Last 24 hours) at 01/01/2023 1132 Last data filed at 01/01/2023 2956 Gross per 24 hour  Intake 1904.38 ml  Output --  Net 1904.38 ml   Filed Weights   12/28/22 1533  Weight: 85.3 kg     Examination:  General exam: Overall comfortable, not in distress, pleasant elderly female, sitting on the chair HEENT: PERRL Respiratory system:  no wheezes or crackles  Cardiovascular system: S1 & S2 heard, RRR.  Gastrointestinal system: Abdomen is nondistended, soft and nontender. Central nervous system: Alert and oriented Extremities: No edema, no clubbing ,no cyanosis Skin: No rashes, no ulcers,no icterus     Data Reviewed: I have personally reviewed following labs and imaging studies  CBC: Recent Labs  Lab 12/28/22 1617 12/29/22 0527 12/29/22 1017 12/30/22 0526 12/30/22 2032 12/31/22 0452 01/01/23 0437  WBC 7.7   < > 8.5 9.9 11.2* 10.0 13.1*  NEUTROABS 5.3  --   --   --  8.1*  --   --   HGB 8.8*   < > 6.6* 7.0* 7.7* 6.9* 8.4*  HCT 28.1*   < > 21.1* 21.8* 23.4* 20.9* 26.9*  MCV 99.3   < > 99.1 95.2 91.1 90.9 93.7  PLT 234   < > 181 187 162 154 168   < > = values in this interval not displayed.   Basic Metabolic Panel: Recent Labs  Lab 12/28/22 1617 12/30/22 0526 12/31/22 0452 01/01/23 0437  NA 137 135 133* 136  K 4.6 4.3 3.2* 4.0  CL 99 101 100 103  CO2 31 27 27 27   GLUCOSE 98 106* 112* 114*  BUN 27* 39* 36* 27*  CREATININE 0.97 1.17* 0.85 1.03*  CALCIUM 7.7* 7.6* 7.3* 7.8*  MG  --  1.8 1.7 2.1     Recent Results (from the past 240 hours)  Gastrointestinal Panel by PCR , Stool     Status: None   Collection Time: 12/28/22  1:39 AM   Specimen: Rectum; Stool  Result Value Ref Range Status   Campylobacter species NOT DETECTED NOT DETECTED Final   Plesimonas shigelloides NOT DETECTED NOT DETECTED Final   Salmonella species NOT DETECTED NOT DETECTED Final   Yersinia enterocolitica NOT DETECTED NOT DETECTED Final   Vibrio species NOT DETECTED NOT DETECTED Final   Vibrio cholerae NOT DETECTED NOT DETECTED Final   Enteroaggregative E coli (EAEC) NOT DETECTED NOT DETECTED Final   Enteropathogenic E coli (EPEC) NOT DETECTED NOT DETECTED Final    Enterotoxigenic E coli (ETEC) NOT DETECTED NOT DETECTED Final   Shiga like toxin producing E coli (STEC) NOT DETECTED NOT DETECTED Final   Shigella/Enteroinvasive E coli (EIEC) NOT DETECTED NOT DETECTED Final   Cryptosporidium NOT DETECTED NOT DETECTED Final   Cyclospora cayetanensis NOT DETECTED NOT DETECTED Final   Entamoeba histolytica NOT DETECTED NOT DETECTED Final   Giardia lamblia NOT DETECTED NOT DETECTED Final   Adenovirus F40/41 NOT DETECTED NOT DETECTED Final   Astrovirus NOT DETECTED NOT DETECTED Final   Norovirus GI/GII NOT DETECTED NOT DETECTED Final   Rotavirus A NOT DETECTED NOT DETECTED Final   Sapovirus (I, II, IV, and V) NOT DETECTED NOT DETECTED Final    Comment: Performed at [facility], [address] [place], [place] [address]     Radiology Studies: CT ANGIO GI BLEED Result Date: 12/30/2022 CLINICAL DATA:  Blood in stool.  Lower GI bleed. EXAM: CTA ABDOMEN AND PELVIS WITHOUT AND WITH CONTRAST TECHNIQUE:  Multidetector CT imaging of the abdomen and pelvis was performed using the standard protocol during bolus administration of intravenous contrast. Multiplanar reconstructed images and MIPs were obtained and reviewed to evaluate the vascular anatomy. RADIATION DOSE REDUCTION: This exam was performed according to the departmental dose-optimization program which includes automated exposure control, adjustment of the mA and/or kV according to patient size and/or use of iterative reconstruction technique. CONTRAST:  80mL OMNIPAQUE IOHEXOL 350 MG/ML SOLN COMPARISON:  CT abdomen pelvis dated 12/28/2022. FINDINGS: VASCULAR Aorta: Advanced atherosclerotic calcification of the abdominal aorta. No aneurysmal dilatation or dissection. No periaortic fluid collection. Celiac: Atherosclerotic calcification of the origin of the celiac trunk. The celiac artery and its major branches are patent. SMA: Atherosclerotic calcification of the origin of the SMA. The SMA is patent. Renals:  There is atherosclerotic calcification of the origins of the renal arteries. The renal arteries are patent. IMA: The IMA is patent. Inflow: Moderate atherosclerotic calcification of the iliac arteries. The iliac arteries are patent. No aneurysmal dilatation or dissection. Proximal Outflow: The visualized proximal outflow is patent. Veins: The IVC is unremarkable.  No portal venous gas. Review of the MIP images confirms the above findings. NON-VASCULAR Lower chest: There are bibasilar subpleural atelectasis/scarring. There is coronary vascular calcification. Partially visualized cardiac pacemaker wires. No intra-abdominal free air or free fluid. Hepatobiliary: Subcentimeter hypodense focus in the right lobe of the liver is too small to characterize. There is mild dilatation with cholecystectomy. No retained calcified stone noted in the central CBD. Pancreas: Unremarkable. No pancreatic ductal dilatation or surrounding inflammatory changes. Spleen: Small calcified splenic granuloma. Adrenals/Urinary Tract: The right adrenal gland is unremarkable. There is a 15 mm indeterminate left adrenal nodule similar to prior CT, favored to represent an adenoma. There is no hydronephrosis or nephrolithiasis on either side. There is symmetric enhancement of the renal parenchyma. The visualized ureters and urinary bladder appear unremarkable. Stomach/Bowel: There is severe sigmoid diverticulosis without active inflammatory changes. There is no bowel obstruction. Evaluation for active GI bleed is very limited, almost nondiagnostic, due to presence of oral contrast within the colon. No definite large active bleed. The appendix is not visualized with certainty. No inflammatory changes identified in the right lower quadrant. Lymphatic: No adenopathy. Reproductive: Hysterectomy. Other: None Musculoskeletal: Osteopenia with degenerative changes spine. Status post ORIF of right femoral neck fracture. IMPRESSION: 1. Very limited, almost  nondiagnostic evaluation for active GI bleed due to presence of oral contrast in the colon. 2. Severe sigmoid diverticulosis. No bowel obstruction. 3.  Aortic Atherosclerosis (ICD10-I70.0). Electronically Signed   By: Elgie Collard M.D.   On: 12/30/2022 17:08    Scheduled Meds:  acetaminophen  1,000 mg Oral QHS   atorvastatin  40 mg Oral QPM   melatonin  3 mg Oral QHS   mesalamine  1,000 mg Oral BID   mometasone-formoterol  2 puff Inhalation BID   pantoprazole  40 mg Oral BID   Continuous Infusions:     LOS: 4 days   Burnadette Pop, MD [facility] P12/24/2024, 11:32 AM

## 2023-01-01 NOTE — Evaluation (Signed)
Physical Therapy Evaluation Patient Details Name: Felicia Benson MRN: [id number] DOB: 12-10-1930 Today's Date: 01/01/2023  History of Present Illness  89 yo female presents to therapy following hospital admission on 12/28/2022 due to bright red blood from rectum. CT of abdomen indicated no acute findings, however positive for diverticulosis and adrenal gland. Pt has hx of pancreatic cyst and unchanged at this time. Pt admitted due to diverticular hemorrhage. Pt required transfusion 12/23. Pt has PMH including but not limited to: AAA, CHF, crohn's dz, GERD, GI bleed, HTN, OSA, pacemaker,  thyroid nodule, COPD, chronic hypoxic respiratory failure and on 4 L/min supplemental O2 at baseline, and R femur fx with resultant IM nail (10/2022).  Clinical Impression   Pt admitted with above diagnosis.  Pt currently with functional limitations due to the deficits listed below (see PT Problem List).  Pt reports she is feeling much better following transfusion yesterday and agreeable to PT eval and to sitting in recliner. Pt required min A for supine to sit for R LE to EOB, pt required CGA for sit to stand  from EOB and for SPT bed to recliner. Pt on 2 L/min at time of evaluation and 92-93% via [place] and no reports of SOB, pain or dizziness. It is anticipated that pt will return to [facility] for continued therapy services.  Patient will benefit from continued inpatient follow up therapy, <3 hours/day. Pt will benefit from acute skilled PT to increase their independence and safety with mobility to allow discharge.         If plan is discharge home, recommend the following: A lot of help with walking and/or transfers;A little help with bathing/dressing/bathroom;Assistance with cooking/housework;Assist for transportation;Help with stairs or ramp for entrance   Can travel by private vehicle        Equipment Recommendations None recommended by PT (TBD in next venue)  Recommendations for Other Services        Functional Status Assessment Patient has had a recent decline in their functional status and demonstrates the ability to make significant improvements in function in a reasonable and predictable amount of time.     Precautions / Restrictions Precautions Precautions: Fall Restrictions Weight Bearing Restrictions Per Provider Order: No      Mobility  Bed Mobility Overal bed mobility: Needs Assistance Bed Mobility: Supine to Sit     Supine to sit: Min assist, HOB elevated, Used rails     General bed mobility comments: min A for R LE to EOB, cues and increased time    Transfers Overall transfer level: Needs assistance Equipment used: Rolling walker (2 wheels) Transfers: Sit to/from Stand, Bed to chair/wheelchair/BSC Sit to Stand: Contact guard assist Stand pivot transfers: Contact guard assist         General transfer comment: min cues    Ambulation/Gait               General Gait Details: pt agreeable with bed to recliner SPT at time of eval  Stairs            Wheelchair Mobility     Tilt Bed    Modified Rankin (Stroke Patients Only)       Balance Overall balance assessment: Needs assistance, History of Falls Sitting-balance support: Feet supported Sitting balance-Leahy Scale: Good     Standing balance support: Bilateral upper extremity supported, Reliant on assistive device for balance, During functional activity Standing balance-Leahy Scale: Poor  Pertinent Vitals/Pain Pain Assessment Pain Assessment: No/denies pain    Home Living Family/patient expects to be discharged to:: Skilled nursing facility                   Additional Comments: 4L O2 baseline, wears CPAP at night    Prior Function Prior Level of Function : Needs assist       Physical Assist : Mobility (physical);ADLs (physical)     Mobility Comments: prior to fall 10/2022 and IM nail pt resided with daughter and mod I  for all ADLs, self care tasks and required use of [facility] for mobiltity tasks. ADLs Comments: pt d/c from hospital following R femur fx and IM nail and transitioned to [facility] for rehab. pt is to return to SNF and complete short term rehab then home     Extremity/Trunk Assessment        Lower Extremity Assessment Lower Extremity Assessment: Generalized weakness (R LE edema noted)    Cervical / Trunk Assessment Cervical / Trunk Assessment: Normal  Communication   Communication Communication: Hearing impairment  Cognition Arousal: Alert Behavior During Therapy: WFL for tasks assessed/performed Overall Cognitive Status: Within Functional Limits for tasks assessed                                          General Comments      Exercises     Assessment/Plan    PT Assessment Patient needs continued PT services  PT Problem List Decreased strength;Decreased activity tolerance;Decreased balance;Decreased mobility       PT Treatment Interventions DME instruction;Gait training;Functional mobility training;Therapeutic activities;Therapeutic exercise;Balance training;Neuromuscular re-education;Patient/family education    PT Goals (Current goals can be found in the Care Plan section)  Acute Rehab PT Goals Patient Stated Goal: to get stronger, return to rehab and then go home PT Goal Formulation: With patient Time For Goal Achievement: 01/16/23 Potential to Achieve Goals: Good    Frequency Min 1X/week     Co-evaluation               AM-PAC PT "6 Clicks" Mobility  Outcome Measure Help needed turning from your back to your side while in a flat bed without using bedrails?: A Little Help needed moving from lying on your back to sitting on the side of a flat bed without using bedrails?: A Little Help needed moving to and from a bed to a chair (including a wheelchair)?: A Little Help needed standing up from a chair using your arms (e.g., wheelchair or  bedside chair)?: A Little Help needed to walk in hospital room?: Total Help needed climbing 3-5 steps with a railing? : Total 6 Click Score: 14    End of Session Equipment Utilized During Treatment: Gait belt;Oxygen (pt on 2 L/min via [place] at time of eval 92-93%) Activity Tolerance: Patient limited by fatigue Patient left: with call bell/phone within reach Nurse Communication: Mobility status PT Visit Diagnosis: Unsteadiness on feet (R26.81);Other abnormalities of gait and mobility (R26.89);Muscle weakness (generalized) (M62.81);History of falling (Z91.81)    Time: 1610-9604 PT Time Calculation (min) (ACUTE ONLY): 14 min   Charges:       PT General Charges $$ ACUTE PT VISIT: 1 Visit         Johnny Bridge, PT Acute Rehab   Jacqualyn Posey 01/01/2023, 11:26 AM

## 2023-01-01 NOTE — Progress Notes (Signed)
[facility] Gastroenterology Progress Note  Felicia Benson 89 y.o. Jun 21, 1930  CC: Rectal bleeding   Subjective: Patient seen and examined at bedside.  Denies any further bleeding since yesterday afternoon.  Sitting comfortably in the recliner.  ROS : Afebrile, negative for nausea and vomiting   Objective: Vital signs in last 24 hours: Vitals:   01/01/23 0537 01/01/23 0835  BP: 117/64   Pulse: (!) 110   Resp: 20   Temp: 98.4 F (36.9 C)   SpO2: 92% 90%    Physical Exam:  General:  Alert, cooperative, no distress, appears stated age  Head:  Normocephalic, without obvious abnormality, atraumatic  Eyes:  , EOM's intact,   Lungs:   No visible respiratory distress  Heart:  Regular rate and rhythm, S1, S2 normal  Abdomen:   Soft, non-tender, bowel sounds active all four quadrants,  no masses,    Alert and oriented x 3       Lab Results: Recent Labs    12/31/22 0452 01/01/23 0437  NA 133* 136  K 3.2* 4.0  CL 100 103  CO2 27 27  GLUCOSE 112* 114*  BUN 36* 27*  CREATININE 0.85 1.03*  CALCIUM 7.3* 7.8*  MG 1.7 2.1   No results for input(s): "AST", "ALT", "ALKPHOS", "BILITOT", "PROT", "ALBUMIN" in the last 72 hours.  Recent Labs    12/30/22 2032 12/31/22 0452 01/01/23 0437  WBC 11.2* 10.0 13.1*  NEUTROABS 8.1*  --   --   HGB 7.7* 6.9* 8.4*  HCT 23.4* 20.9* 26.9*  MCV 91.1 90.9 93.7  PLT 162 154 168   No results for input(s): "LABPROT", "INR" in the last 72 hours.    Assessment/Plan: -Rectal bleeding.  Likely diverticular in nature.  CT angio was nondiagnostic because of oral contrast in the colon but no obvious bleeding was seen. -Acute blood loss anemia.  Status post units of transfusion. -History of Crohn's disease.  Currently on Pentasa. -History of COPD.  Recommendations --------------------------- -No further bleeding since yesterday afternoon.  Hemoglobin stable after blood transfusion. -No further inpatient GI workup planned. -Advance diet to  soft. -Continue Pentasa -Okay to discharge from GI standpoint.  Resume aspirin after 72 hours. -Follow-up with Dr. Bosie Clos after discharge in 2 months.  Kathi Der MD, FACP 01/01/2023, 10:39 AM  Contact #  [phone]

## 2023-01-01 NOTE — Progress Notes (Signed)
   01/01/23 1330  Assess: MEWS Score  Temp 98.4 F (36.9 C)  BP (!) 100/52  MAP (mmHg) 67  Pulse Rate (!) 103  Resp 14  Level of Consciousness Alert  SpO2 95 %  O2 Device Nasal Cannula  Assess: MEWS Score  MEWS Temp 0  MEWS Systolic 1  MEWS Pulse 1  MEWS RR 0  MEWS LOC 0  MEWS Score 2  MEWS Score Color Yellow  Assess: if the MEWS score is Yellow or Red  Were vital signs accurate and taken at a resting state? Yes  Does the patient meet 2 or more of the SIRS criteria? Yes  Does the patient have a confirmed or suspected source of infection? Yes  MEWS guidelines implemented  Yes, yellow  Treat  MEWS Interventions Considered administering scheduled or prn medications/treatments as ordered  Take Vital Signs  Increase Vital Sign Frequency  Yellow: Q2hr x1, continue Q4hrs until patient remains green for 12hrs  Escalate  MEWS: Escalate Yellow: Discuss with charge nurse and consider notifying provider and/or RRT  Notify: Charge Nurse/RN  Name of Charge Nurse/RN Notified Darl Pikes, RN  Provider Notification  Provider Name/Title Burnadette Pop, MD  Date Provider Notified 01/01/23  Time Provider Notified 1345  Method of Notification Page  Notification Reason Other (Comment) (yellow MEWS)  Provider response Evaluate remotely;No new orders  Date of Provider Response 01/01/23  Time of Provider Response 1350  Assess: SIRS CRITERIA  SIRS Temperature  0  SIRS Respirations  0  SIRS Pulse 1  SIRS WBC 1  SIRS Score Sum  2

## 2023-01-02 DIAGNOSIS — K5731 Diverticulosis of large intestine without perforation or abscess with bleeding: Secondary | ICD-10-CM | POA: Diagnosis not present

## 2023-01-02 LAB — CBC
HCT: 24.2 % — ABNORMAL LOW (ref 36.0–46.0)
Hemoglobin: 7.7 g/dL — ABNORMAL LOW (ref 12.0–15.0)
MCH: 30.4 pg (ref 26.0–34.0)
MCHC: 31.8 g/dL (ref 30.0–36.0)
MCV: 95.7 fL (ref 80.0–100.0)
Platelets: 159 10*3/uL (ref 150–400)
RBC: 2.53 MIL/uL — ABNORMAL LOW (ref 3.87–5.11)
RDW: 19.2 % — ABNORMAL HIGH (ref 11.5–15.5)
WBC: 12.5 10*3/uL — ABNORMAL HIGH (ref 4.0–10.5)
nRBC: 0 % (ref 0.0–0.2)

## 2023-01-02 MED ORDER — GUAIFENESIN 100 MG/5ML PO LIQD
5.0000 mL | ORAL | Status: DC
Start: 2023-01-02 — End: 2023-01-03
  Administered 2023-01-02 – 2023-01-03 (×6): 5 mL via ORAL
  Filled 2023-01-02 (×6): qty 10

## 2023-01-02 MED ORDER — AZITHROMYCIN 250 MG PO TABS
500.0000 mg | ORAL_TABLET | Freq: Every day | ORAL | Status: DC
  Administered 2023-01-02 – 2023-01-03 (×2): 500 mg via ORAL
  Filled 2023-01-02 (×2): qty 2

## 2023-01-02 MED ORDER — GUAIFENESIN 100 MG/5ML PO LIQD
5.0000 mL | ORAL | Status: DC | PRN
Start: 2023-01-02 — End: 2023-01-02

## 2023-01-02 NOTE — Plan of Care (Signed)
  Problem: Clinical Measurements: Goal: Ability to maintain clinical measurements within normal limits will improve Outcome: Progressing   

## 2023-01-02 NOTE — Progress Notes (Signed)
  Progress Note   Patient: Felicia Benson NWG:[id number] DOB: 02/28/1930 DOA: 12/28/2022     5 DOS: the patient was seen and examined on 01/02/2023   Brief hospital course:  Patient is a 89 year old female with history of osteoarthritis, Crohn's disease, GERD, hypertension, abdominal aortic aneurysm who presented with complaint of rectal bleed.  Suspected to have diverticular bleed.  Hospital course remarkable for persistent acute blood loss anemia requiring blood transfusion.  GI following.  Currently on conservative management.  Plan for discharge tomorrow if hemoglobin remains stable and no further episodes of hematochezia      Assessment and Plan:   Principal Problem:   Diverticular hemorrhage   Hematochezia/diverticular bleed:  Acute blood loss anemia:  Presented with painless rectal bleeding. Hospital course remarkable for persistent acute blood loss anemia requiring transfusion of 2 units of packed RBC.  GI following.  Currently on conservative management.  CT abdomen did not show any acute findings.  CT angiogram bleed done on 12/22 did not show any active bleeding.  Showed severe sigmoid diverticulosis. 12/24 Yesterday afternoon she had a big bowel movement with hematochezia.  Diet advanced to soft. 12/25 continue to monitor H&H     History of coronary artery disease:  Aspirin on hold due to acute blood loss anemia Per GI resume aspirin after 72 hours of no further episodes of bleeding    Crohn's disease:  Continue Pentasa.   Follow-up with gastroenterology as an outpatient.Follows with Dr Bosie Clos    COPD with acute exacerbation Chronic hypoxic respiratory  failure: On  4 L of oxygen at home.   Has a cough productive of green phlegm Place patient on Zithromax 500mg  x 3 days Continue PRN and scheduled bronchodilator therapy as well as inhaled steroids Currently on the same.  Currently not in exacerbation   AKI: Resolved   Hypokalemia: Supplemented and  corrected     Hyperlipidemia: On statin   Chronic diastolic CHF: On Lasix at home.  Currently on hold.  Currently euvolemic   GERD: Continue PPI   Recent right hip fracture: Currently  residing at a skilled nursing facility because of recent hip fracture.  Expected discharge to SNF likely tomorrow,             Subjective: Complains of a cough productive of green phlegm. Had a bloody BM overnight. None this am  Physical Exam: Vitals:   01/01/23 2139 01/02/23 0114 01/02/23 0544 01/02/23 0821  BP: (!) 99/54 104/66 108/60   Pulse: 99 92 95   Resp: 18 15 17    Temp: 98.1 F (36.7 C) 97.9 F (36.6 C) (!) 97.5 F (36.4 C)   TempSrc: Oral Oral Oral   SpO2: 95% 93% 95% 96%  Weight:      Height:        Data Reviewed: Labs reviewed. Hb 7.7, WBC 12.5 There are no new results to review at this time.  Family Communication: Plan of care discussed with patient in detail. She verbalizes understanding and agrees with the plan  Disposition: Status is: Inpatient Remains inpatient appropriate because: ABLA  Planned Discharge Destination: Skilled nursing facility    Time spent: 33 minutes  Author: Lucile Shutters, MD 01/02/2023 8:55 AM  For on call review [email or web address].

## 2023-01-03 DIAGNOSIS — K5731 Diverticulosis of large intestine without perforation or abscess with bleeding: Secondary | ICD-10-CM | POA: Diagnosis not present

## 2023-01-03 LAB — CBC
HCT: 23.6 % — ABNORMAL LOW (ref 36.0–46.0)
Hemoglobin: 7.7 g/dL — ABNORMAL LOW (ref 12.0–15.0)
MCH: 30.7 pg (ref 26.0–34.0)
MCHC: 32.6 g/dL (ref 30.0–36.0)
MCV: 94 fL (ref 80.0–100.0)
Platelets: 175 10*3/uL (ref 150–400)
RBC: 2.51 MIL/uL — ABNORMAL LOW (ref 3.87–5.11)
RDW: 18.6 % — ABNORMAL HIGH (ref 11.5–15.5)
WBC: 11.2 10*3/uL — ABNORMAL HIGH (ref 4.0–10.5)
nRBC: 0 % (ref 0.0–0.2)

## 2023-01-03 MED ORDER — DOCUSATE SODIUM 100 MG PO CAPS
100.0000 mg | ORAL_CAPSULE | Freq: Every day | ORAL | Status: DC
  Administered 2023-01-03: 100 mg via ORAL
  Filled 2023-01-03: qty 1

## 2023-01-03 MED ORDER — DOCUSATE SODIUM 100 MG PO CAPS: 100.0000 mg | ORAL_CAPSULE | Freq: Every day | ORAL | 0 refills | Status: DC

## 2023-01-03 NOTE — TOC Transition Note (Signed)
Transition of Care [facility]) - Discharge Note  Patient Details  Name: Felicia Benson MRN: [id number] Date of Birth: 24-Sep-1930  [facility]) CM/SW Contact:  Ewing Schlein, LCSW Phone Number: 01/03/2023, 2:04 PM  Clinical Narrative: Patient is medically stable for discharge back to [facility]. CSW confirmed admission with Lowella Bandy at the facility. Patient will go to room 513 and the number for report is [phone]. Discharge summary, discharge orders, FL2, and SNF transfer report faxed to facility in hub. Medical necessity form done; PTAR scheduled. Discharge packet completed. Daughter notified regarding transportation. RN updated. TOC signing off.  Final next level of care: Skilled Nursing Facility Barriers to Discharge: Barriers Resolved  Patient Goals and CMS Choice Patient states their goals for this hospitalization and ongoing recovery are:: Return to [employer] Compare Post Acute Care list provided to:: Patient Choice offered to / list presented to : Patient, Adult Children  Discharge Placement Existing PASRR number confirmed : 01/03/23          Patient chooses bed at: [facility] Patient to be transferred to facility by: PTAR Name of family member notified: [name] (daughter) Patient and family notified of of transfer: 01/03/23  Discharge Plan and Services Additional resources added to the After Visit Summary for       DME Arranged: N/A DME Agency: NA  Social Drivers of Health (SDOH) Interventions SDOH Screenings   Food Insecurity: No Food Insecurity (12/28/2022)  Housing: High Risk (12/28/2022)  Transportation Needs: No Transportation Needs (12/28/2022)  Utilities: Not At Risk (12/28/2022)  Tobacco Use: Medium Risk (12/28/2022)   Readmission Risk Interventions    07/24/2022    2:49 PM  Readmission Risk Prevention Plan  Transportation Screening Complete  PCP or Specialist Appt within 5-7 Days Complete  Home Care  Screening Complete  Medication Review (RN CM) Complete

## 2023-01-03 NOTE — Progress Notes (Signed)
Report called to facility, pt d/c via PTAR w all belongings in stable condition.

## 2023-01-03 NOTE — Discharge Summary (Addendum)
Physician Discharge Summary  Felicia Benson:[id number] DOB: 1930/04/25 DOA: 12/28/2022  PCP: [name], MD  Admit date: 12/28/2022 Discharge date: 01/03/2023  Admitted From: SNF Disposition:  SNF   Recommendations for Outpatient Follow-up:  Follow up with PCP  Follow up with repeat CBC to check hemoglobin   Discharge Condition: Stable CODE STATUS: DNR  Diet recommendation:  Diet Orders (From admission, onward)     Start     Ordered   01/01/23 1041  DIET SOFT Fluid consistency: Thin  Diet effective now       Question:  Fluid consistency:  Answer:  Thin   01/01/23 1040           Brief/Interim Summary: Felicia Benson is a 89 year old female with history of osteoarthritis, Crohn's disease, GERD, hypertension, abdominal aortic aneurysm who presented with complaint of rectal bleed.  Suspected to have diverticular bleed.  Hospital course remarkable for persistent acute blood loss anemia requiring blood transfusion.  GI following.  She has been on medical management with stabilization.     Discharge Diagnoses:   Principal Problem:   Diverticular hemorrhage Active Problems:   COPD (chronic obstructive pulmonary disease) (HCC)   Chronic diastolic CHF (congestive heart failure) (HCC)   Crohn's disease (HCC)   Acute blood loss anemia   Chronic hypoxic respiratory failure (HCC)   CKD stage 3a, GFR 45-59 ml/min (HCC)   GERD (gastroesophageal reflux disease)   Closed right hip fracture (HCC)   History of COPD  Hematochezia secondary to diverticular bleed Acute blood loss anemia -CT abdomen did not show any acute findings. CT angiogram bleed done on 12/22 did not show any active bleeding. Showed severe sigmoid diverticulosis.  -Required 3 unit transfusion PRBC -Appreciate GI -Stable   CAD -Aspirin on hold, resume on 12/27  Crohn's disease -Pentasa -Follow up with Dr. Bosie Clos in 2 months   COPD with chronic hypoxic respiratory failure -On 4L O2 at  baseline -Stable  AKI on CKD stage 3a -Resolved  HLD -Lipitor   Chronic dCHF -Stable. Lasix.   Recept right hip fracture -SNF recommended   Discharge Instructions  Discharge Instructions     Increase activity slowly   Complete by: As directed    No wound care   Complete by: As directed       Allergies as of 01/03/2023       Reactions   Ibuprofen Other (See Comments)   "Allergic," per [facility] and GI bleed   Ace Inhibitors Other (See Comments), Cough   "Allergic," per [facility]    Breztri Aerosphere [budeson-glycopyrrol-formoterol] Hypertension   Ciprofloxacin Other (See Comments)   "Allergic," per [facility]  and avoid fluoroquinolones due to asc aortic aneurysm   Levofloxacin Other (See Comments)   Aneurysm and "Allergic," per [facility]    Nsaids Nausea And Vomiting, Other (See Comments)   Pt has preference to Tylenol and "Allergic," per [facility]         Medication List     PAUSE taking these medications    aspirin EC 81 MG tablet Wait to take this until: January 04, 2023 Morning Take 1 tablet (81 mg total) by mouth daily. Swallow whole.       STOP taking these medications    enoxaparin 40 MG/0.4ML injection Commonly known as: LOVENOX       TAKE these medications    albuterol 108 (90 Base) MCG/ACT inhaler Commonly known as: VENTOLIN HFA Inhale 2 puffs into the lungs every 6 (six) hours as needed for  wheezing or shortness of breath.   atorvastatin 40 MG tablet Commonly known as: LIPITOR Take 40 mg by mouth every evening.   docusate sodium 100 MG capsule Commonly known as: COLACE Take 1 capsule (100 mg total) by mouth daily. Start taking on: January 04, 2023 What changed:  how much to take when to take this   fluticasone 50 MCG/ACT nasal spray Commonly known as: FLONASE Place 2 sprays into both nostrils daily. What changed: when to take this   furosemide 20 MG tablet Commonly known as: LASIX Take 60 mg by mouth in the morning.   lidocaine 5 % Commonly  known as: LIDODERM Place 1 patch onto the skin See admin instructions. Apply 1 new patch to the lower back in the morning- Remove & Discard patch within 12 hours or as directed by MD   loratadine 10 MG tablet Commonly known as: CLARITIN Take 10 mg by mouth daily.   meclizine 12.5 MG tablet Commonly known as: ANTIVERT Take 12.5 mg by mouth every 8 (eight) hours as needed for dizziness.   melatonin 3 MG Tabs tablet Take 1 tablet (3 mg total) by mouth at bedtime.   mometasone-formoterol 100-5 MCG/ACT Aero Commonly known as: DULERA Inhale 2 puffs into the lungs in the morning and at bedtime.   montelukast 10 MG tablet Commonly known as: SINGULAIR Take 1 tablet (10 mg total) by mouth daily.   Nasal Moist Gel Place 1 application  into both nostrils in the morning and at bedtime.   pantoprazole 40 MG tablet Commonly known as: PROTONIX Take 40 mg by mouth in the morning and at bedtime.   Pentasa 500 MG CR capsule Generic drug: mesalamine Take 1,000 mg by mouth in the morning and at bedtime.   polyethylene glycol 17 g packet Commonly known as: MIRALAX / GLYCOLAX Take 17 g by mouth daily as needed. What changed: reasons to take this   potassium chloride SA 20 MEQ tablet Commonly known as: KLOR-CON M Take 40 mEq by mouth daily.   Pro-Stat Liqd Take 30 mLs by mouth 2 (two) times daily.   TYLENOL 500 MG tablet Generic drug: acetaminophen Take 1,000 mg by mouth every 8 (eight) hours as needed (for pain).        Contact information for follow-up providers     Charlott Rakes, MD. Schedule an appointment as soon as possible for a visit in 2 month(s).   Specialty: Gastroenterology Why: post Hospitalization follow-up, Crohn's disease Contact information: [address] [place] [address] [phone]              Contact information for after-discharge care     Destination     [facility] Preferred SNF .   Service: Skilled  Nursing Contact information: [address] [place] [address] [phone]                       Procedures/Studies: CT ANGIO GI BLEED Result Date: 12/30/2022 CLINICAL DATA:  Blood in stool.  Lower GI bleed. EXAM: CTA ABDOMEN AND PELVIS WITHOUT AND WITH CONTRAST TECHNIQUE: Multidetector CT imaging of the abdomen and pelvis was performed using the standard protocol during bolus administration of intravenous contrast. Multiplanar reconstructed images and MIPs were obtained and reviewed to evaluate the vascular anatomy. RADIATION DOSE REDUCTION: This exam was performed according to the departmental dose-optimization program which includes automated exposure control, adjustment of the mA and/or kV according to patient size and/or use of  iterative reconstruction technique. CONTRAST:  80mL OMNIPAQUE IOHEXOL 350 MG/ML SOLN COMPARISON:  CT abdomen pelvis dated 12/28/2022. FINDINGS: VASCULAR Aorta: Advanced atherosclerotic calcification of the abdominal aorta. No aneurysmal dilatation or dissection. No periaortic fluid collection. Celiac: Atherosclerotic calcification of the origin of the celiac trunk. The celiac artery and its major branches are patent. SMA: Atherosclerotic calcification of the origin of the SMA. The SMA is patent. Renals: There is atherosclerotic calcification of the origins of the renal arteries. The renal arteries are patent. IMA: The IMA is patent. Inflow: Moderate atherosclerotic calcification of the iliac arteries. The iliac arteries are patent. No aneurysmal dilatation or dissection. Proximal Outflow: The visualized proximal outflow is patent. Veins: The IVC is unremarkable.  No portal venous gas. Review of the MIP images confirms the above findings. NON-VASCULAR Lower chest: There are bibasilar subpleural atelectasis/scarring. There is coronary vascular calcification. Partially visualized cardiac pacemaker wires. No intra-abdominal free air or free fluid.  Hepatobiliary: Subcentimeter hypodense focus in the right lobe of the liver is too small to characterize. There is mild dilatation with cholecystectomy. No retained calcified stone noted in the central CBD. Pancreas: Unremarkable. No pancreatic ductal dilatation or surrounding inflammatory changes. Spleen: Small calcified splenic granuloma. Adrenals/Urinary Tract: The right adrenal gland is unremarkable. There is a 15 mm indeterminate left adrenal nodule similar to prior CT, favored to represent an adenoma. There is no hydronephrosis or nephrolithiasis on either side. There is symmetric enhancement of the renal parenchyma. The visualized ureters and urinary bladder appear unremarkable. Stomach/Bowel: There is severe sigmoid diverticulosis without active inflammatory changes. There is no bowel obstruction. Evaluation for active GI bleed is very limited, almost nondiagnostic, due to presence of oral contrast within the colon. No definite large active bleed. The appendix is not visualized with certainty. No inflammatory changes identified in the right lower quadrant. Lymphatic: No adenopathy. Reproductive: Hysterectomy. Other: None Musculoskeletal: Osteopenia with degenerative changes spine. Status post ORIF of right femoral neck fracture. IMPRESSION: 1. Very limited, almost nondiagnostic evaluation for active GI bleed due to presence of oral contrast in the colon. 2. Severe sigmoid diverticulosis. No bowel obstruction. 3.  Aortic Atherosclerosis (ICD10-I70.0). Electronically Signed   By: Elgie Collard M.D.   On: 12/30/2022 17:08   CT ABDOMEN PELVIS W CONTRAST Result Date: 12/28/2022 CLINICAL DATA:  Lower gastrointestinal bleeding. Rectal bleeding. Loose bloody stools. EXAM: CT ABDOMEN AND PELVIS WITH CONTRAST TECHNIQUE: Multidetector CT imaging of the abdomen and pelvis was performed using the standard protocol following bolus administration of intravenous contrast. RADIATION DOSE REDUCTION: This exam was  performed according to the departmental dose-optimization program which includes automated exposure control, adjustment of the mA and/or kV according to patient size and/or use of iterative reconstruction technique. CONTRAST:  80mL OMNIPAQUE IOHEXOL 300 MG/ML  SOLN COMPARISON:  Abdominal radiographs 11/11/2022. CT abdomen and pelvis 11/03/2022 and 10/18/2020 FINDINGS: Lower chest: Emphysematous changes in the lung bases. Small bilateral pleural effusions with basilar atelectasis. Hepatobiliary: Calcified granulomas in the liver. Surgical absence of the gallbladder. Bile ducts are unremarkable. Pancreas: Cyst at the junction of the body and tail of the pancreas measuring 1.4 cm diameter. No change since previous study. No inflammatory changes suggested around the pancreas. Spleen: Calcified granulomas in the spleen. Adrenals/Urinary Tract: Left adrenal gland nodule, 1.6 cm diameter, 133 Hounsfield units density. No change since prior study dated 10/18/2020. Kidneys are symmetrical with homogeneous nephrograms. No hydronephrosis or hydroureter. Bladder is normal. Stomach/Bowel: Stomach, small bowel, and colon are not abnormally distended. No wall thickening or inflammatory  changes. Diverticulosis of the sigmoid colon. No evidence of acute diverticulitis. Appendix is not identified. Vascular/Lymphatic: Aortic atherosclerosis. No enlarged abdominal or pelvic lymph nodes. Reproductive: Status post hysterectomy. No adnexal masses. Other: No abdominal wall hernia or abnormality. No abdominopelvic ascites. Musculoskeletal: Degenerative changes in the spine and hips. Postoperative fixation of the right proximal femur. No acute bony abnormalities. IMPRESSION: 1. No acute process demonstrated in the abdomen or pelvis. 2. Diverticulosis of the colon without evidence of acute diverticulitis. 3. Small bilateral pleural effusions with basilar atelectasis. 4. 1.6 cm left adrenal gland nodule is unchanged since 10/18/2020. The CT  appearance is indeterminate but long-term stability suggests benign etiology. No additional follow-up is indicated. 5. 1.4 cm diameter cyst in the body of the pancreas, unchanged since 10/18/2020. Long-term stability suggests benign etiology. Electronically Signed   By: Burman Nieves M.D.   On: 12/28/2022 19:41       Discharge Exam: Vitals:   01/03/23 0919 01/03/23 1200  BP:    Pulse:    Resp:    Temp:    SpO2: 97% 95%    General: Pt is alert, awake, not in acute distress Cardiovascular: RRR, S1/S2 +, no edema Respiratory: CTA bilaterally, no wheezing, no rhonchi, no respiratory distress, no conversational dyspnea  Abdominal: Soft, NT, ND, bowel sounds + Extremities: no edema, no cyanosis Psych: Normal mood and affect, stable judgement and insight     The results of significant diagnostics from this hospitalization (including imaging, microbiology, ancillary and laboratory) are listed below for reference.     Microbiology: Recent Results (from the past 240 hours)  Gastrointestinal Panel by PCR , Stool     Status: None   Collection Time: 12/28/22  1:39 AM   Specimen: Rectum; Stool  Result Value Ref Range Status   Campylobacter species NOT DETECTED NOT DETECTED Final   Plesimonas shigelloides NOT DETECTED NOT DETECTED Final   Salmonella species NOT DETECTED NOT DETECTED Final   Yersinia enterocolitica NOT DETECTED NOT DETECTED Final   Vibrio species NOT DETECTED NOT DETECTED Final   Vibrio cholerae NOT DETECTED NOT DETECTED Final   Enteroaggregative E coli (EAEC) NOT DETECTED NOT DETECTED Final   Enteropathogenic E coli (EPEC) NOT DETECTED NOT DETECTED Final   Enterotoxigenic E coli (ETEC) NOT DETECTED NOT DETECTED Final   Shiga like toxin producing E coli (STEC) NOT DETECTED NOT DETECTED Final   Shigella/Enteroinvasive E coli (EIEC) NOT DETECTED NOT DETECTED Final   Cryptosporidium NOT DETECTED NOT DETECTED Final   Cyclospora cayetanensis NOT DETECTED NOT DETECTED Final    Entamoeba histolytica NOT DETECTED NOT DETECTED Final   Giardia lamblia NOT DETECTED NOT DETECTED Final   Adenovirus F40/41 NOT DETECTED NOT DETECTED Final   Astrovirus NOT DETECTED NOT DETECTED Final   Norovirus GI/GII NOT DETECTED NOT DETECTED Final   Rotavirus A NOT DETECTED NOT DETECTED Final   Sapovirus (I, II, IV, and V) NOT DETECTED NOT DETECTED Final    Comment: Performed at [facility], [address] [place], [place] [address]  Calprotectin, Fecal     Status: Abnormal   Collection Time: 12/28/22  1:39 AM   Specimen: Rectum; Stool  Result Value Ref Range Status   Calprotectin, Fecal 147 (H) 0 - 120 ug/g Final    Comment: (NOTE) Concentration     Interpretation   Follow-Up < 5 - 50 ug/g     Normal           None >50 -120 ug/g     Borderline  Re-evaluate in 4-6 weeks    >120 ug/g     Abnormal         Repeat as clinically                                   indicated Performed At: [facility] [address] [place], [place] [id number] Jolene Schimke MD HY:[id number]      Labs: BNP (last 3 results) Recent Labs    07/21/22 1903  BNP 194.6*   Basic Metabolic Panel: Recent Labs  Lab 12/28/22 1617 12/30/22 0526 12/31/22 0452 01/01/23 0437  NA 137 135 133* 136  K 4.6 4.3 3.2* 4.0  CL 99 101 100 103  CO2 31 27 27 27   GLUCOSE 98 106* 112* 114*  BUN 27* 39* 36* 27*  CREATININE 0.97 1.17* 0.85 1.03*  CALCIUM 7.7* 7.6* 7.3* 7.8*  MG  --  1.8 1.7 2.1   Liver Function Tests: Recent Labs  Lab 12/28/22 1617  AST 17  ALT 9  ALKPHOS 75  BILITOT 0.8  PROT 6.0*  ALBUMIN 2.6*   Recent Labs  Lab 12/28/22 1617  LIPASE 39   No results for input(s): "AMMONIA" in the last 168 hours. CBC: Recent Labs  Lab 12/28/22 1617 12/29/22 0527 12/30/22 2032 12/31/22 0452 01/01/23 0437 01/02/23 0436 01/03/23 0648  WBC 7.7   < > 11.2* 10.0 13.1* 12.5* 11.2*  NEUTROABS 5.3  --  8.1*  --   --   --   --   HGB 8.8*   < > 7.7* 6.9* 8.4* 7.7* 7.7*   HCT 28.1*   < > 23.4* 20.9* 26.9* 24.2* 23.6*  MCV 99.3   < > 91.1 90.9 93.7 95.7 94.0  PLT 234   < > 162 154 168 159 175   < > = values in this interval not displayed.   Cardiac Enzymes: No results for input(s): "CKTOTAL", "CKMB", "CKMBINDEX", "TROPONINI" in the last 168 hours. BNP: Invalid input(s): "POCBNP" CBG: No results for input(s): "GLUCAP" in the last 168 hours. D-Dimer No results for input(s): "DDIMER" in the last 72 hours. Hgb A1c No results for input(s): "HGBA1C" in the last 72 hours. Lipid Profile No results for input(s): "CHOL", "HDL", "LDLCALC", "TRIG", "CHOLHDL", "LDLDIRECT" in the last 72 hours. Thyroid function studies No results for input(s): "TSH", "T4TOTAL", "T3FREE", "THYROIDAB" in the last 72 hours.  Invalid input(s): "FREET3" Anemia work up No results for input(s): "VITAMINB12", "FOLATE", "FERRITIN", "TIBC", "IRON", "RETICCTPCT" in the last 72 hours. Urinalysis    Component Value Date/Time   COLORURINE YELLOW 12/31/2020 0100   APPEARANCEUR CLEAR 12/31/2020 0100   LABSPEC 1.020 12/31/2020 0100   PHURINE 6.0 12/31/2020 0100   GLUCOSEU NEGATIVE 12/31/2020 0100   HGBUR NEGATIVE 12/31/2020 0100   BILIRUBINUR NEGATIVE 12/31/2020 0100   KETONESUR NEGATIVE 12/31/2020 0100   PROTEINUR NEGATIVE 12/31/2020 0100   NITRITE POSITIVE (A) 12/31/2020 0100   LEUKOCYTESUR NEGATIVE 12/31/2020 0100   Sepsis Labs Recent Labs  Lab 12/31/22 0452 01/01/23 0437 01/02/23 0436 01/03/23 0648  WBC 10.0 13.1* 12.5* 11.2*   Microbiology Recent Results (from the past 240 hours)  Gastrointestinal Panel by PCR , Stool     Status: None   Collection Time: 12/28/22  1:39 AM   Specimen: Rectum; Stool  Result Value Ref Range Status   Campylobacter species NOT DETECTED NOT DETECTED Final   Plesimonas shigelloides NOT DETECTED NOT DETECTED Final   Salmonella species  NOT DETECTED NOT DETECTED Final   Yersinia enterocolitica NOT DETECTED NOT DETECTED Final   Vibrio species NOT  DETECTED NOT DETECTED Final   Vibrio cholerae NOT DETECTED NOT DETECTED Final   Enteroaggregative E coli (EAEC) NOT DETECTED NOT DETECTED Final   Enteropathogenic E coli (EPEC) NOT DETECTED NOT DETECTED Final   Enterotoxigenic E coli (ETEC) NOT DETECTED NOT DETECTED Final   Shiga like toxin producing E coli (STEC) NOT DETECTED NOT DETECTED Final   Shigella/Enteroinvasive E coli (EIEC) NOT DETECTED NOT DETECTED Final   Cryptosporidium NOT DETECTED NOT DETECTED Final   Cyclospora cayetanensis NOT DETECTED NOT DETECTED Final   Entamoeba histolytica NOT DETECTED NOT DETECTED Final   Giardia lamblia NOT DETECTED NOT DETECTED Final   Adenovirus F40/41 NOT DETECTED NOT DETECTED Final   Astrovirus NOT DETECTED NOT DETECTED Final   Norovirus GI/GII NOT DETECTED NOT DETECTED Final   Rotavirus A NOT DETECTED NOT DETECTED Final   Sapovirus (I, II, IV, and V) NOT DETECTED NOT DETECTED Final    Comment: Performed at [facility], [address] [place], [place] [address]  Calprotectin, Fecal     Status: Abnormal   Collection Time: 12/28/22  1:39 AM   Specimen: Rectum; Stool  Result Value Ref Range Status   Calprotectin, Fecal 147 (H) 0 - 120 ug/g Final    Comment: (NOTE) Concentration     Interpretation   Follow-Up < 5 - 50 ug/g     Normal           None >50 -120 ug/g     Borderline       Re-evaluate in 4-6 weeks    >120 ug/g     Abnormal         Repeat as clinically                                   indicated Performed At: [facility] [address] [place], [place] [id number] Jolene Schimke MD [id number]      Patient was seen and examined on the day of discharge and was found to be in stable condition. Time coordinating discharge: 35 minutes including assessment and coordination of care, as well as examination of the patient.   SIGNED:  [name], DO [facility] 01/03/2023, 1:05 PM

## 2023-01-03 NOTE — [place] FL2 (Signed)
[place] MEDICAID FL2 LEVEL OF CARE FORM     IDENTIFICATION  Patient Name: Felicia Benson Birthdate: 05-10-1930 Sex: female Admission Date (Current Location): 12/28/2022  [facility] and [place] Number:  Producer, television/film/video and Address:  [facility],  [address] [place], [place] [address]      Provider Number: [id number]  Attending Physician Name and Address:  [name], DO  Relative Name and Phone Number:  [name] (daughter) Ph: [phone]    Current Level of Care: SNF Recommended Level of Care: Skilled Nursing Facility Prior Approval Number:    Date Approved/Denied:   PASRR Number: [id number]  Discharge Plan: SNF    Current Diagnoses: Patient Active Problem List   Diagnosis Date Noted   Diverticular hemorrhage 12/28/2022   Ileus, postoperative (HCC) 11/04/2022   Closed right hip fracture (HCC) 10/28/2022   Fall at home, initial encounter 10/28/2022   History of COPD 10/28/2022   Allergic rhinitis 10/28/2022   Anemia of chronic disease 10/28/2022   Chronic rhinitis 08/06/2022   OSA (obstructive sleep apnea) 08/06/2022   Heart failure, acute diastolic (HCC) 07/21/2022   CKD stage 3a, GFR 45-59 ml/min (HCC) 07/21/2022   Thoracic aortic aneurysm (HCC) 07/21/2022   GERD (gastroesophageal reflux disease) 07/21/2022   Pancreatic lesion 07/21/2022   Macular degeneration 07/21/2022   Hypocalcemia 07/21/2022   Insomnia 07/21/2022   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   TIA (transient ischemic attack) 12/31/2020   Chronic diastolic CHF (congestive heart failure) (HCC) 05/19/2019   Acute CHF (congestive heart failure) (HCC) 05/18/2019   Chronic hypoxic respiratory failure (HCC) 05/18/2019   COPD (chronic obstructive pulmonary disease) (HCC) 05/18/2019   History of cardiac pacemaker 05/18/2019   AAA (abdominal aortic aneurysm) 05/18/2019   Hyperlipidemia 05/18/2019   Abdominal pain in female 05/30/2015   Essential hypertension 05/30/2015    Crohn's disease (HCC) 05/30/2015   Gastrointestinal hemorrhage with melena 05/30/2015   Blood loss anemia 05/30/2015   Hyponatremia 05/30/2015   Kidney disease 05/30/2015   Normocytic anemia 05/30/2015   CAD S/P percutaneous coronary angioplasty 05/30/2015   Acute blood loss anemia 05/30/2015   Heme + stool     Orientation RESPIRATION BLADDER Height & Weight     Self, Time, Situation, Place  O2 (3L/min) Incontinent Weight: 188 lb (85.3 kg) Height:  5\' 6"  (167.6 cm)  BEHAVIORAL SYMPTOMS/MOOD NEUROLOGICAL BOWEL NUTRITION STATUS      Incontinent Diet (See discharge summary)  AMBULATORY STATUS COMMUNICATION OF NEEDS Skin   Extensive Assist Verbally Skin abrasions, Other (Comment) (Abrasions: right hip; Erythema: buttocks; Ecchymosis: bilateral arms & legs)                       Personal Care Assistance Level of Assistance  Bathing, Feeding, Dressing Bathing Assistance: Limited assistance Feeding assistance: Independent Dressing Assistance: Limited assistance     Functional Limitations Info  Sight, Hearing, Speech Sight Info: Impaired Hearing Info: Adequate Speech Info: Adequate    SPECIAL CARE FACTORS FREQUENCY  PT (By licensed PT), OT (By licensed OT)     PT Frequency: 5x's/week OT Frequency: 5x's/week            Contractures Contractures Info: Not present    Additional Factors Info  Code Status, Allergies Code Status Info: DNR Allergies Info: Ibuprofen, Ace Inhibitors, Breztri Aerosphere (Budeson-glycopyrrol-formoterol), Ciprofloxacin, Levofloxacin, Nsaids           Current Medications (01/03/2023):  This is the current hospital active medication list Current Facility-Administered Medications  Medication Dose Route Frequency Provider Last Rate Last Admin   acetaminophen (TYLENOL) tablet 650 mg  650 mg Oral Q6H PRN Alan Mulder, MD   650 mg at 12/28/22 2239   Or   acetaminophen (TYLENOL) suppository 650 mg  650 mg Rectal Q6H PRN Alan Mulder,  MD       acetaminophen (TYLENOL) tablet 1,000 mg  1,000 mg Oral QHS Burnadette Pop, MD   1,000 mg at 01/02/23 2155   atorvastatin (LIPITOR) tablet 40 mg  40 mg Oral QPM Alan Mulder, MD   40 mg at 01/02/23 1838   azithromycin (ZITHROMAX) tablet 500 mg  500 mg Oral Daily Agbata, Tochukwu, MD   500 mg at 01/03/23 1051   docusate sodium (COLACE) capsule 100 mg  100 mg Oral Daily [name], DO       guaiFENesin (ROBITUSSIN) 100 MG/5ML liquid 5 mL  5 mL Oral Q4H Agbata, Tochukwu, MD   5 mL at 01/03/23 1051   ipratropium-albuterol (DUONEB) 0.5-2.5 (3) MG/3ML nebulizer solution 3 mL  3 mL Nebulization Q6H PRN Burnadette Pop, MD   3 mL at 01/01/23 1740   melatonin tablet 3 mg  3 mg Oral QHS Adhikari, Amrit, MD   3 mg at 01/02/23 2155   mesalamine (PENTASA) CR capsule 1,000 mg  1,000 mg Oral BID Alan Mulder, MD   1,000 mg at 01/03/23 1051   mometasone-formoterol (DULERA) 100-5 MCG/ACT inhaler 2 puff  2 puff Inhalation BID Alan Mulder, MD   2 puff at 01/03/23 0917   ondansetron (ZOFRAN) tablet 4 mg  4 mg Oral Q6H PRN Alan Mulder, MD       Or   ondansetron (ZOFRAN) injection 4 mg  4 mg Intravenous Q6H PRN Alan Mulder, MD   4 mg at 01/02/23 1804   Oral care mouth rinse  15 mL Mouth Rinse PRN Rolly Salter, MD       oxyCODONE (Oxy IR/ROXICODONE) immediate release tablet 5 mg  5 mg Oral Q4H PRN Dorrell, Robert, MD       pantoprazole (PROTONIX) EC tablet 40 mg  40 mg Oral BID Alan Mulder, MD   40 mg at 01/03/23 1051     Discharge Medications: Please see discharge summary for a list of discharge medications.  Relevant Imaging Results:  Relevant Lab Results:   Additional Information SSN: [id number]  Ewing Schlein, LCSW

## 2023-01-03 NOTE — Plan of Care (Signed)

## 2023-02-07 ENCOUNTER — Emergency Department (HOSPITAL_COMMUNITY): Payer: Medicare Other

## 2023-02-07 ENCOUNTER — Other Ambulatory Visit: Payer: Self-pay

## 2023-02-07 ENCOUNTER — Inpatient Hospital Stay (HOSPITAL_COMMUNITY)
Admission: EM | Admit: 2023-02-07 | Discharge: 2023-02-19 | DRG: 175 | Disposition: A | Discharge status: Skilled Nursing Facility | Payer: Medicare Other | Attending: Internal Medicine | Admitting: Internal Medicine

## 2023-02-07 DIAGNOSIS — K5090 Crohn's disease, unspecified, without complications: Secondary | ICD-10-CM | POA: Diagnosis present

## 2023-02-07 DIAGNOSIS — Z1152 Encounter for screening for COVID-19: Secondary | ICD-10-CM | POA: Diagnosis not present

## 2023-02-07 DIAGNOSIS — I5082 Biventricular heart failure: Secondary | ICD-10-CM | POA: Diagnosis present

## 2023-02-07 DIAGNOSIS — R0602 Shortness of breath: Secondary | ICD-10-CM | POA: Diagnosis present

## 2023-02-07 DIAGNOSIS — G4733 Obstructive sleep apnea (adult) (pediatric): Secondary | ICD-10-CM | POA: Diagnosis present

## 2023-02-07 DIAGNOSIS — I472 Ventricular tachycardia, unspecified: Secondary | ICD-10-CM | POA: Diagnosis not present

## 2023-02-07 DIAGNOSIS — K219 Gastro-esophageal reflux disease without esophagitis: Secondary | ICD-10-CM | POA: Diagnosis present

## 2023-02-07 DIAGNOSIS — I82409 Acute embolism and thrombosis of unspecified deep veins of unspecified lower extremity: Secondary | ICD-10-CM | POA: Diagnosis not present

## 2023-02-07 DIAGNOSIS — J440 Chronic obstructive pulmonary disease with acute lower respiratory infection: Secondary | ICD-10-CM | POA: Diagnosis present

## 2023-02-07 DIAGNOSIS — I82452 Acute embolism and thrombosis of left peroneal vein: Secondary | ICD-10-CM | POA: Diagnosis present

## 2023-02-07 DIAGNOSIS — Z79899 Other long term (current) drug therapy: Secondary | ICD-10-CM

## 2023-02-07 DIAGNOSIS — Z955 Presence of coronary angioplasty implant and graft: Secondary | ICD-10-CM

## 2023-02-07 DIAGNOSIS — K921 Melena: Secondary | ICD-10-CM | POA: Diagnosis not present

## 2023-02-07 DIAGNOSIS — J9621 Acute and chronic respiratory failure with hypoxia: Secondary | ICD-10-CM | POA: Diagnosis present

## 2023-02-07 DIAGNOSIS — I2699 Other pulmonary embolism without acute cor pulmonale: Secondary | ICD-10-CM | POA: Diagnosis present

## 2023-02-07 DIAGNOSIS — N1831 Chronic kidney disease, stage 3a: Secondary | ICD-10-CM | POA: Diagnosis present

## 2023-02-07 DIAGNOSIS — E66812 Obesity, class 2: Secondary | ICD-10-CM | POA: Diagnosis present

## 2023-02-07 DIAGNOSIS — I358 Other nonrheumatic aortic valve disorders: Secondary | ICD-10-CM | POA: Diagnosis present

## 2023-02-07 DIAGNOSIS — Z823 Family history of stroke: Secondary | ICD-10-CM

## 2023-02-07 DIAGNOSIS — Z6830 Body mass index (BMI) 30.0-30.9, adult: Secondary | ICD-10-CM

## 2023-02-07 DIAGNOSIS — I82402 Acute embolism and thrombosis of unspecified deep veins of left lower extremity: Secondary | ICD-10-CM | POA: Diagnosis not present

## 2023-02-07 DIAGNOSIS — T501X5A Adverse effect of loop [high-ceiling] diuretics, initial encounter: Secondary | ICD-10-CM | POA: Diagnosis present

## 2023-02-07 DIAGNOSIS — Z886 Allergy status to analgesic agent status: Secondary | ICD-10-CM

## 2023-02-07 DIAGNOSIS — I130 Hypertensive heart and chronic kidney disease with heart failure and stage 1 through stage 4 chronic kidney disease, or unspecified chronic kidney disease: Secondary | ICD-10-CM | POA: Diagnosis present

## 2023-02-07 DIAGNOSIS — I714 Abdominal aortic aneurysm, without rupture, unspecified: Secondary | ICD-10-CM | POA: Diagnosis present

## 2023-02-07 DIAGNOSIS — Z950 Presence of cardiac pacemaker: Secondary | ICD-10-CM

## 2023-02-07 DIAGNOSIS — Z888 Allergy status to other drugs, medicaments and biological substances status: Secondary | ICD-10-CM

## 2023-02-07 DIAGNOSIS — M7989 Other specified soft tissue disorders: Secondary | ICD-10-CM | POA: Diagnosis not present

## 2023-02-07 DIAGNOSIS — J189 Pneumonia, unspecified organism: Secondary | ICD-10-CM | POA: Diagnosis present

## 2023-02-07 DIAGNOSIS — Z87891 Personal history of nicotine dependence: Secondary | ICD-10-CM

## 2023-02-07 DIAGNOSIS — I442 Atrioventricular block, complete: Secondary | ICD-10-CM | POA: Diagnosis present

## 2023-02-07 DIAGNOSIS — E785 Hyperlipidemia, unspecified: Secondary | ICD-10-CM | POA: Diagnosis present

## 2023-02-07 DIAGNOSIS — I25119 Atherosclerotic heart disease of native coronary artery with unspecified angina pectoris: Secondary | ICD-10-CM | POA: Diagnosis present

## 2023-02-07 DIAGNOSIS — R9389 Abnormal findings on diagnostic imaging of other specified body structures: Secondary | ICD-10-CM

## 2023-02-07 DIAGNOSIS — I2510 Atherosclerotic heart disease of native coronary artery without angina pectoris: Secondary | ICD-10-CM

## 2023-02-07 DIAGNOSIS — Z66 Do not resuscitate: Secondary | ICD-10-CM | POA: Diagnosis present

## 2023-02-07 DIAGNOSIS — I2609 Other pulmonary embolism with acute cor pulmonale: Secondary | ICD-10-CM | POA: Diagnosis not present

## 2023-02-07 DIAGNOSIS — J90 Pleural effusion, not elsewhere classified: Secondary | ICD-10-CM | POA: Diagnosis present

## 2023-02-07 DIAGNOSIS — I5033 Acute on chronic diastolic (congestive) heart failure: Secondary | ICD-10-CM | POA: Diagnosis present

## 2023-02-07 DIAGNOSIS — R1312 Dysphagia, oropharyngeal phase: Secondary | ICD-10-CM | POA: Diagnosis present

## 2023-02-07 DIAGNOSIS — J9601 Acute respiratory failure with hypoxia: Secondary | ICD-10-CM | POA: Diagnosis present

## 2023-02-07 DIAGNOSIS — I2693 Single subsegmental pulmonary embolism without acute cor pulmonale: Secondary | ICD-10-CM | POA: Diagnosis present

## 2023-02-07 DIAGNOSIS — Z8701 Personal history of pneumonia (recurrent): Secondary | ICD-10-CM

## 2023-02-07 DIAGNOSIS — Z9981 Dependence on supplemental oxygen: Secondary | ICD-10-CM

## 2023-02-07 DIAGNOSIS — I50810 Right heart failure, unspecified: Secondary | ICD-10-CM

## 2023-02-07 DIAGNOSIS — D631 Anemia in chronic kidney disease: Secondary | ICD-10-CM | POA: Diagnosis present

## 2023-02-07 DIAGNOSIS — Z9861 Coronary angioplasty status: Secondary | ICD-10-CM | POA: Diagnosis not present

## 2023-02-07 DIAGNOSIS — Z881 Allergy status to other antibiotic agents status: Secondary | ICD-10-CM

## 2023-02-07 DIAGNOSIS — I5032 Chronic diastolic (congestive) heart failure: Secondary | ICD-10-CM | POA: Diagnosis not present

## 2023-02-07 DIAGNOSIS — N179 Acute kidney failure, unspecified: Secondary | ICD-10-CM | POA: Diagnosis present

## 2023-02-07 DIAGNOSIS — Z7982 Long term (current) use of aspirin: Secondary | ICD-10-CM

## 2023-02-07 DIAGNOSIS — I429 Cardiomyopathy, unspecified: Secondary | ICD-10-CM | POA: Diagnosis present

## 2023-02-07 DIAGNOSIS — Z7951 Long term (current) use of inhaled steroids: Secondary | ICD-10-CM

## 2023-02-07 DIAGNOSIS — R0600 Dyspnea, unspecified: Secondary | ICD-10-CM | POA: Diagnosis not present

## 2023-02-07 DIAGNOSIS — Z7901 Long term (current) use of anticoagulants: Secondary | ICD-10-CM

## 2023-02-07 DIAGNOSIS — Z9049 Acquired absence of other specified parts of digestive tract: Secondary | ICD-10-CM

## 2023-02-07 DIAGNOSIS — Z801 Family history of malignant neoplasm of trachea, bronchus and lung: Secondary | ICD-10-CM

## 2023-02-07 DIAGNOSIS — N189 Chronic kidney disease, unspecified: Secondary | ICD-10-CM

## 2023-02-07 LAB — CBC
HCT: 28 % — ABNORMAL LOW (ref 36.0–46.0)
Hemoglobin: 8.3 g/dL — ABNORMAL LOW (ref 12.0–15.0)
MCH: 28.9 pg (ref 26.0–34.0)
MCHC: 29.6 g/dL — ABNORMAL LOW (ref 30.0–36.0)
MCV: 97.6 fL (ref 80.0–100.0)
Platelets: 293 10*3/uL (ref 150–400)
RBC: 2.87 MIL/uL — ABNORMAL LOW (ref 3.87–5.11)
RDW: 18.4 % — ABNORMAL HIGH (ref 11.5–15.5)
WBC: 4.6 10*3/uL (ref 4.0–10.5)
nRBC: 0 % (ref 0.0–0.2)

## 2023-02-07 LAB — COMPREHENSIVE METABOLIC PANEL
ALT: 8 U/L (ref 0–44)
AST: 18 U/L (ref 15–41)
Albumin: 2.1 g/dL — ABNORMAL LOW (ref 3.5–5.0)
Alkaline Phosphatase: 70 U/L (ref 38–126)
Anion gap: 11 (ref 5–15)
BUN: 26 mg/dL — ABNORMAL HIGH (ref 8–23)
CO2: 26 mmol/L (ref 22–32)
Calcium: 7.8 mg/dL — ABNORMAL LOW (ref 8.9–10.3)
Chloride: 100 mmol/L (ref 98–111)
Creatinine, Ser: 1.04 mg/dL — ABNORMAL HIGH (ref 0.44–1.00)
GFR, Estimated: 51 mL/min — ABNORMAL LOW (ref >60)
Glucose, Bld: 92 mg/dL (ref 70–99)
Potassium: 4.3 mmol/L (ref 3.5–5.1)
Sodium: 137 mmol/L (ref 135–145)
Total Bilirubin: 0.5 mg/dL (ref 0.0–1.2)
Total Protein: 5.3 g/dL — ABNORMAL LOW (ref 6.5–8.1)

## 2023-02-07 LAB — I-STAT VENOUS BLOOD GAS, ED
Acid-Base Excess: 5 mmol/L — ABNORMAL HIGH (ref 0.0–2.0)
Bicarbonate: 30.8 mmol/L — ABNORMAL HIGH (ref 20.0–28.0)
Calcium, Ion: 1.06 mmol/L — ABNORMAL LOW (ref 1.15–1.40)
HCT: 27 % — ABNORMAL LOW (ref 36.0–46.0)
Hemoglobin: 9.2 g/dL — ABNORMAL LOW (ref 12.0–15.0)
O2 Saturation: 90 %
Potassium: 4.2 mmol/L (ref 3.5–5.1)
Sodium: 138 mmol/L (ref 135–145)
TCO2: 32 mmol/L (ref 22–32)
pCO2, Ven: 50.1 mm[Hg] (ref 44–60)
pH, Ven: 7.396 (ref 7.25–7.43)
pO2, Ven: 60 mm[Hg] — ABNORMAL HIGH (ref 32–45)

## 2023-02-07 LAB — RESP PANEL BY RT-PCR (RSV, FLU A&B, COVID)  RVPGX2
Influenza A by PCR: NEGATIVE
Influenza B by PCR: NEGATIVE
Resp Syncytial Virus by PCR: NEGATIVE
SARS Coronavirus 2 by RT PCR: NEGATIVE

## 2023-02-07 LAB — TROPONIN I (HIGH SENSITIVITY): Troponin I (High Sensitivity): 23 ng/L — ABNORMAL HIGH (ref <18)

## 2023-02-07 LAB — BRAIN NATRIURETIC PEPTIDE: B Natriuretic Peptide: 829.2 pg/mL — ABNORMAL HIGH (ref 0.0–100.0)

## 2023-02-07 MED ORDER — ENOXAPARIN SODIUM 40 MG/0.4ML IJ SOSY
40.0000 mg | PREFILLED_SYRINGE | INTRAMUSCULAR | Status: DC
  Administered 2023-02-08: 40 mg via SUBCUTANEOUS
  Filled 2023-02-07: qty 0.4

## 2023-02-07 MED ORDER — MOMETASONE FURO-FORMOTEROL FUM 200-5 MCG/ACT IN AERO
2.0000 | INHALATION_SPRAY | Freq: Two times a day (BID) | RESPIRATORY_TRACT | Status: DC
  Administered 2023-02-08 – 2023-02-19 (×22): 2 via RESPIRATORY_TRACT
  Filled 2023-02-07 (×2): qty 8.8

## 2023-02-07 MED ORDER — ACETAMINOPHEN 325 MG PO TABS
650.0000 mg | ORAL_TABLET | Freq: Four times a day (QID) | ORAL | Status: DC | PRN
  Administered 2023-02-08 – 2023-02-18 (×15): 650 mg via ORAL
  Filled 2023-02-07 (×17): qty 2

## 2023-02-07 MED ORDER — MESALAMINE ER 250 MG PO CPCR
1000.0000 mg | ORAL_CAPSULE | Freq: Two times a day (BID) | ORAL | Status: DC
  Administered 2023-02-07 – 2023-02-19 (×24): 1000 mg via ORAL
  Filled 2023-02-07 (×24): qty 4

## 2023-02-07 MED ORDER — ALBUTEROL SULFATE (2.5 MG/3ML) 0.083% IN NEBU
2.5000 mg | INHALATION_SOLUTION | Freq: Four times a day (QID) | RESPIRATORY_TRACT | Status: DC | PRN
  Administered 2023-02-08: 2.5 mg via RESPIRATORY_TRACT
  Filled 2023-02-07: qty 3

## 2023-02-07 MED ORDER — MONTELUKAST SODIUM 10 MG PO TABS
10.0000 mg | ORAL_TABLET | Freq: Every day | ORAL | Status: DC
  Administered 2023-02-08 – 2023-02-19 (×12): 10 mg via ORAL
  Filled 2023-02-07 (×12): qty 1

## 2023-02-07 MED ORDER — SODIUM CHLORIDE 0.9 % IV SOLN
500.0000 mg | Freq: Once | INTRAVENOUS | Status: AC
  Administered 2023-02-07: 500 mg via INTRAVENOUS
  Filled 2023-02-07: qty 5

## 2023-02-07 MED ORDER — IPRATROPIUM-ALBUTEROL 0.5-2.5 (3) MG/3ML IN SOLN
3.0000 mL | Freq: Once | RESPIRATORY_TRACT | Status: AC
  Administered 2023-02-07: 3 mL via RESPIRATORY_TRACT
  Filled 2023-02-07: qty 3

## 2023-02-07 MED ORDER — SODIUM CHLORIDE 0.9 % IV SOLN
500.0000 mg | INTRAVENOUS | Status: AC
  Administered 2023-02-08 – 2023-02-09 (×2): 500 mg via INTRAVENOUS
  Filled 2023-02-07 (×2): qty 5

## 2023-02-07 MED ORDER — IOHEXOL 350 MG/ML SOLN
75.0000 mL | Freq: Once | INTRAVENOUS | Status: AC | PRN
  Administered 2023-02-07: 75 mL via INTRAVENOUS

## 2023-02-07 MED ORDER — ATORVASTATIN CALCIUM 40 MG PO TABS
40.0000 mg | ORAL_TABLET | Freq: Every day | ORAL | Status: DC
  Administered 2023-02-07 – 2023-02-18 (×12): 40 mg via ORAL
  Filled 2023-02-07 (×12): qty 1

## 2023-02-07 MED ORDER — FUROSEMIDE 40 MG PO TABS
40.0000 mg | ORAL_TABLET | Freq: Every day | ORAL | Status: DC
  Administered 2023-02-08 – 2023-02-11 (×4): 40 mg via ORAL
  Filled 2023-02-07: qty 1
  Filled 2023-02-07: qty 2
  Filled 2023-02-07 (×2): qty 1

## 2023-02-07 MED ORDER — SODIUM CHLORIDE 0.9% FLUSH
3.0000 mL | Freq: Two times a day (BID) | INTRAVENOUS | Status: DC
  Administered 2023-02-07 – 2023-02-18 (×14): 3 mL via INTRAVENOUS

## 2023-02-07 MED ORDER — ASPIRIN 81 MG PO TBEC
81.0000 mg | DELAYED_RELEASE_TABLET | Freq: Every day | ORAL | Status: DC
Start: 2023-02-08 — End: 2023-02-10
  Administered 2023-02-08 – 2023-02-09 (×2): 81 mg via ORAL
  Filled 2023-02-07 (×3): qty 1

## 2023-02-07 MED ORDER — SODIUM CHLORIDE 0.9 % IV SOLN: 1.0000 g | Freq: Once | INTRAVENOUS | Status: DC

## 2023-02-07 MED ORDER — ACETAMINOPHEN 650 MG RE SUPP: 650.0000 mg | Freq: Four times a day (QID) | RECTAL | Status: DC | PRN

## 2023-02-07 MED ORDER — AMPICILLIN-SULBACTAM SODIUM 3 (2-1) G IJ SOLR
3.0000 g | Freq: Four times a day (QID) | INTRAMUSCULAR | Status: DC
  Administered 2023-02-07 – 2023-02-11 (×14): 3 g via INTRAVENOUS
  Filled 2023-02-07 (×14): qty 8

## 2023-02-07 MED ORDER — POLYETHYLENE GLYCOL 3350 17 G PO PACK
17.0000 g | PACK | Freq: Every day | ORAL | Status: DC | PRN
  Filled 2023-02-07: qty 1

## 2023-02-07 MED ORDER — PANTOPRAZOLE SODIUM 40 MG PO TBEC
40.0000 mg | DELAYED_RELEASE_TABLET | Freq: Two times a day (BID) | ORAL | Status: DC
  Administered 2023-02-07 – 2023-02-19 (×24): 40 mg via ORAL
  Filled 2023-02-07 (×24): qty 1

## 2023-02-07 MED ORDER — ACETAMINOPHEN 500 MG PO TABS
1000.0000 mg | ORAL_TABLET | Freq: Once | ORAL | Status: AC
  Administered 2023-02-07: 1000 mg via ORAL
  Filled 2023-02-07: qty 2

## 2023-02-07 MED ORDER — METOPROLOL SUCCINATE ER 25 MG PO TB24
25.0000 mg | ORAL_TABLET | Freq: Every day | ORAL | Status: DC
  Administered 2023-02-08 – 2023-02-19 (×11): 25 mg via ORAL
  Filled 2023-02-07 (×12): qty 1

## 2023-02-07 NOTE — Assessment & Plan Note (Addendum)
I carefully reviewd images of CT scan. Althgouth not identified by radiology, I think it is visible on Image 254/407 in transverse sections, image135/188 in sagital section and 55/125 in coronal section. This does not appear like a PE at all. I do not think it warrants anticoagualtion tonight, especially in view of aptient recent GI bleed for which colonoscopy is penidng. Out of abundance of caustion, will check lower extermity doppler to asses any other clotting site. I am not sure what the filling defect is however, consider expert input in AM

## 2023-02-07 NOTE — ED Triage Notes (Signed)
Patient BIB [facility] from [facility]. Diagnosed with left sided pneumonia per facility. Lethargy and weaknesses. low sats 90s% COPD , A&O x4 at baseline. Nonambulatory  118/68 HR 98

## 2023-02-07 NOTE — H&P (Signed)
History and Physical    Patient: Felicia Benson ZOX:[id number] DOB: February 16, 1930 DOA: 02/07/2023 DOS: the patient was seen and examined on 02/07/2023 PCP: [name], MD  Patient coming from:  rehab facility.  Chief Complaint:  Chief Complaint  Patient presents with   Pneumonia   HPI: Felicia Benson is a 89 y.o. female with medical history significant of osteoarthritis, Crohn's disease, GERD, hypertension, abdominal aortic aneurysm . Displaced right intertrochanteric femur fracture after a mechanical fall -underwent ORIF by orthopedics on 10/30/2022.subsequent re-hospitalisation and discharged on January 03, 2023 after she was admitted for complaint of rectal bleed suspected to be diverticular in nature requiring several units of blood transfusion patient tells me 3.  This was managed medically CT angiography did not show active bleed.  Although diverticulosis was seen.  Patient was at rehab facility.  Has been complaining of cough with some scant sputum production as well as sensation of shortness of breath, fatigue, poor appetite for the last 7 days or so.  Patient was subsequently noted to have sats of 90% by the facility on her chronic 4 L/min of supplementary oxygen.  Patient was transferred to [facility].  Patient denies any fever leg swelling cramping chest pain palpitation or presyncope.  ER workup is done with CT angiography.  Small segmental pulmonary artery filling defect in the right lower lobe.  Interstitial and groundglass opacities in the lungs bilaterally possible edema or infiltrate.  Patchy airspace disease in the lower lobes bilaterally possible atelectasis or interval infiltrate.  Mild bilateral pleural effusions.   Review of Systems: As mentioned in the history of present illness. All other systems reviewed and are negative. Past Medical History:  Diagnosis Date   AAA (abdominal aortic aneurysm) (HCC)    CHF (congestive heart failure) (HCC)    Crohn's disease (HCC)     Diverticulitis    Diverticulosis    GERD (gastroesophageal reflux disease)    GI bleed    Hypertension    OSA (obstructive sleep apnea)    Pacemaker    Thyroid nodule    Past Surgical History:  Procedure Laterality Date   CHOLECYSTECTOMY     COLONOSCOPY WITH PROPOFOL N/A 06/01/2015   Procedure: COLONOSCOPY WITH PROPOFOL;  Surgeon: Charlott Rakes, MD;  Location: [facility] ENDOSCOPY;  Service: Endoscopy;  Laterality: N/A;   CORONARY ANGIOPLASTY WITH STENT PLACEMENT     ESOPHAGOGASTRODUODENOSCOPY (EGD) WITH PROPOFOL N/A 06/01/2015   Procedure: ESOPHAGOGASTRODUODENOSCOPY (EGD) WITH PROPOFOL;  Surgeon: Charlott Rakes, MD;  Location: [facility] ENDOSCOPY;  Service: Endoscopy;  Laterality: N/A;   GIVENS CAPSULE STUDY N/A 06/03/2015   Procedure: GIVENS CAPSULE STUDY;  Surgeon: Charlott Rakes, MD;  Location: [facility] ENDOSCOPY;  Service: Endoscopy;  Laterality: N/A;   INTRAMEDULLARY (IM) NAIL INTERTROCHANTERIC Right 10/30/2022   Procedure: INTRAMEDULLARY nailing of right femur;  Surgeon: Joen Laura, MD;  Location: [facility] OR;  Service: Orthopedics;  Laterality: Right;   PACEMAKER PLACEMENT  2015   Social History:  reports that she has quit smoking. She has never used smokeless tobacco. She reports that she does not drink alcohol and does not use drugs.  Allergies  Allergen Reactions   Motrin [Ibuprofen] Other (See Comments)    GI bleed   Ace Inhibitors Cough   Breztri Aerosphere [Budeson-Glycopyrrol-Formoterol] Hypertension   Cipro [Ciprofloxacin Hcl] Other (See Comments)    Avoid fluoroquinolones due to asc-aortic aneurysm   Levaquin [Levofloxacin] Other (See Comments)    Avoid fluoroquinolones due to asc-aortic aneurysm   Nsaids Nausea And Vomiting and Other (  See Comments)    Hx of GI bleed    Family History  Problem Relation Age of Onset   CVA Mother    Lung cancer Father    Colon cancer Neg Hx     Prior to Admission medications   Medication Sig Start Date End Date Taking? Authorizing  Provider  albuterol (VENTOLIN HFA) 108 (90 Base) MCG/ACT inhaler Inhale 2 puffs into the lungs every 6 (six) hours as needed for wheezing or shortness of breath. 07/24/22  Yes [name], MD  aspirin EC 81 MG tablet Take 1 tablet (81 mg total) by mouth daily. Swallow whole. 03/29/21  Yes [name], MD  atorvastatin (LIPITOR) 40 MG tablet Take 40 mg by mouth at bedtime.   Yes [provider]  Cholecalciferol (VITAMIN D-3 PO) Take 1 capsule by mouth daily.   Yes [provider]  docusate sodium (COLACE) 100 MG capsule Take 1 capsule (100 mg total) by mouth daily. 01/04/23  Yes [name], DO  ferrous sulfate 325 (65 FE) MG tablet Take 325 mg by mouth daily.   Yes [provider]  furosemide (LASIX) 40 MG tablet Take 40 mg by mouth daily.   Yes [provider]  metoprolol succinate (TOPROL-XL) 25 MG 24 hr tablet Take 25 mg by mouth daily.   Yes [provider]  mometasone-formoterol (DULERA) 200-5 MCG/ACT AERO Inhale 2 puffs into the lungs 2 (two) times daily.   Yes [provider]  montelukast (SINGULAIR) 10 MG tablet Take 1 tablet (10 mg total) by mouth daily. 08/01/21  Yes [name], Adewale A, MD  pantoprazole (PROTONIX) 40 MG tablet Take 40 mg by mouth in the morning and at bedtime.   Yes [provider]  PENTASA 500 MG CR capsule Take 1,000 mg by mouth in the morning and at bedtime.   Yes [provider]  potassium chloride SA (KLOR-CON M) 20 MEQ tablet Take 40 mEq by mouth daily.   Yes [provider]    Physical Exam: Vitals:   02/07/23 1423 02/07/23 1424 02/07/23 1425 02/07/23 1425  BP:    112/69  Pulse:   88   Resp: 17 18 18 19   Temp:    98.6 F (37 C)  SpO2:   98% 99%   General: Patient is alert and awake, gives a coherent account of her symptoms and recent hospital course Respiratory exam: Bilateral intravesicular Cardiovascular exam S1-S2 normal Abdomen all quadrants soft nontender  obese Extremities warm without edema reasonable distal function.  Although patient is generally deconditioned. Data Reviewed:  Labs on Admission:  Results for orders placed or performed during the hospital encounter of 02/07/23 (from the past 24 hours)  Resp panel by RT-PCR (RSV, Flu A&B, Covid) Anterior Nasal Swab     Status: None   Collection Time: 02/07/23  2:20 PM   Specimen: Anterior Nasal Swab  Result Value Ref Range   SARS Coronavirus 2 by RT PCR NEGATIVE NEGATIVE   Influenza A by PCR NEGATIVE NEGATIVE   Influenza B by PCR NEGATIVE NEGATIVE   Resp Syncytial Virus by PCR NEGATIVE NEGATIVE  Comprehensive metabolic panel     Status: Abnormal   Collection Time: 02/07/23  4:35 PM  Result Value Ref Range   Sodium 137 135 - 145 mmol/L   Potassium 4.3 3.5 - 5.1 mmol/L   Chloride 100 98 - 111 mmol/L   CO2 26 22 - 32 mmol/L   Glucose, Bld 92 70 - 99 mg/dL  BUN 26 (H) 8 - 23 mg/dL   Creatinine, Ser 1.61 (H) 0.44 - 1.00 mg/dL   Calcium 7.8 (L) 8.9 - 10.3 mg/dL   Total Protein 5.3 (L) 6.5 - 8.1 g/dL   Albumin 2.1 (L) 3.5 - 5.0 g/dL   AST 18 15 - 41 U/L   ALT 8 0 - 44 U/L   Alkaline Phosphatase 70 38 - 126 U/L   Total Bilirubin 0.5 0.0 - 1.2 mg/dL   GFR, Estimated 51 (L) >60 mL/min   Anion gap 11 5 - 15  I-Stat venous blood gas, ED     Status: Abnormal   Collection Time: 02/07/23  4:45 PM  Result Value Ref Range   pH, Ven 7.396 7.25 - 7.43   pCO2, Ven 50.1 44 - 60 mmHg   pO2, Ven 60 (H) 32 - 45 mmHg   Bicarbonate 30.8 (H) 20.0 - 28.0 mmol/L   TCO2 32 22 - 32 mmol/L   O2 Saturation 90 %   Acid-Base Excess 5.0 (H) 0.0 - 2.0 mmol/L   Sodium 138 135 - 145 mmol/L   Potassium 4.2 3.5 - 5.1 mmol/L   Calcium, Ion 1.06 (L) 1.15 - 1.40 mmol/L   HCT 27.0 (L) 36.0 - 46.0 %   Hemoglobin 9.2 (L) 12.0 - 15.0 g/dL   Sample type VENOUS    Basic Metabolic Panel: Recent Labs  Lab 02/07/23 1635 02/07/23 1645  NA 137 138  K 4.3 4.2  CL 100  --   CO2 26  --   GLUCOSE 92  --   BUN 26*   --   CREATININE 1.04*  --   CALCIUM 7.8*  --    Liver Function Tests: Recent Labs  Lab 02/07/23 1635  AST 18  ALT 8  ALKPHOS 70  BILITOT 0.5  PROT 5.3*  ALBUMIN 2.1*   No results for input(s): "LIPASE", "AMYLASE" in the last 168 hours. No results for input(s): "AMMONIA" in the last 168 hours. CBC: Recent Labs  Lab 02/07/23 1645  HGB 9.2*  HCT 27.0*   Cardiac Enzymes: No results for input(s): "CKTOTAL", "CKMB", "CKMBINDEX", "TROPONINIHS" in the last 168 hours.  BNP (last 3 results) No results for input(s): "PROBNP" in the last 8760 hours. CBG: No results for input(s): "GLUCAP" in the last 168 hours.  Radiological Exams on Admission:  CT Angio Chest PE W and/or Wo Contrast Addendum Date: 02/07/2023 ADDENDUM REPORT: 02/07/2023 20:08 ADDENDUM: Critical Value/emergent results were called by telephone at the time of interpretation on 02/07/2023 at 8:07 pm to provider Dr. Joneen Caraway , who verbally acknowledged these results. Electronically Signed   By: Thornell Sartorius M.D.   On: 02/07/2023 20:08   Result Date: 02/07/2023 CLINICAL DATA:  Pulmonary embolism suspected, high probability. EXAM: CT ANGIOGRAPHY CHEST WITH CONTRAST TECHNIQUE: Multidetector CT imaging of the chest was performed using the standard protocol during bolus administration of intravenous contrast. Multiplanar CT image reconstructions and MIPs were obtained to evaluate the vascular anatomy. RADIATION DOSE REDUCTION: This exam was performed according to the departmental dose-optimization program which includes automated exposure control, adjustment of the mA and/or kV according to patient size and/or use of iterative reconstruction technique. CONTRAST:  75mL OMNIPAQUE IOHEXOL 350 MG/ML SOLN COMPARISON:  07/19/2022. FINDINGS: Cardiovascular: Heart is enlarged and there is no pericardial effusion. Pacemaker leads are noted in the heart. Multi-vessel coronary artery calcifications are present. There is atherosclerotic  calcification of the aorta with aneurysmal dilatation of the ascending aorta measuring 4.3 cm. The pulmonary trunk  is distended suggesting underlying pulmonary artery hypertension. There is a suspected pulmonary artery filling defect in a segmental pulmonary artery in the right lower lobe. The right ventricle is distended, similar in appearance to the prior exam. Mediastinum/Nodes: Enlarged lymph nodes are present in the mediastinum measuring up to 1.4 cm in the AP window. Enlarged hilar lymph nodes are present bilaterally. No axillary lymphadenopathy. The thyroid gland is enlarged with heterogeneous attenuation. Surgical clips are noted in the region of the left lobe of the thyroid gland. The trachea and esophagus are within normal limits. There is a small hiatal hernia. Lungs/Pleura: Ground-glass opacities and interstitial prominence are present in the lungs bilaterally. There is patchy atelectasis or infiltrate in the lower lobes bilaterally. Small bilateral pleural effusions are noted, greater on the right than on the left. No pneumothorax is seen. Upper Abdomen: The gallbladder is surgically absent. Scattered diverticula are noted along the colon without diverticulitis. No acute abnormality. Musculoskeletal: A pacemaker device is noted in the left chest wall. Degenerative changes are noted in the thoracic spine. No acute osseous abnormality is seen. Review of the MIP images confirms the above findings. IMPRESSION: 1. Small segmental pulmonary artery filling defect in the right lower lobe. The right ventricle is distended, similar in appearance to prior exams which may reflect right heart failure. The possibility of superimposed right heart strain can not be excluded. 2. Interstitial and ground-glass opacities in the lungs bilaterally, possible edema or infiltrate. 3. Patchy airspace disease in the lower lobes bilaterally, possible atelectasis or infiltrate. 4. Small bilateral pleural effusions. 5. Coronary  artery calcifications. 6. Distended pulmonary trunk suggesting underlying pulmonary artery hypertension. 7. Aortic atherosclerosis with aneurysmal dilatation of the ascending aorta measuring 4.3 cm. Recommend annual imaging followup by CTA or MRA. This recommendation follows 2010 ACCF/AHA/AATS/ACR/ASA/SCA/SCAI/SIR/STS/SVM Guidelines for the Diagnosis and Management of Patients with Thoracic Aortic Disease. Circulation. 2010; 121: W098-J191. Aortic aneurysm NOS (ICD10-I71.9) Electronically Signed: By: Thornell Sartorius M.D. On: 02/07/2023 20:02   DG Chest Port 1 View Result Date: 02/07/2023 CLINICAL DATA:  Lethargy and weakness. EXAM: PORTABLE CHEST 1 VIEW COMPARISON:  Chest radiograph dated November 09, 2022. FINDINGS: Stable cardiomediastinal silhouette. Stable dual lead left-sided pacemaker in place. Aortic atherosclerosis. Streaky bibasilar opacities. Similar blunting of the left costophrenic angle. No pneumothorax. Partially visualized postsurgical changes of the cervical spine. Surgical staples along the left inferomedial neck. No acute osseous abnormality. IMPRESSION: 1. Streaky bibasilar opacities could reflect atelectasis, edema, or infiltrate. 2. Similar blunting of the left costophrenic angle may reflect a trace pleural effusion versus overlying soft tissue. Electronically Signed   By: Hart Robinsons M.D.   On: 02/07/2023 15:23     No intake/output data recorded. No intake/output data recorded.      Assessment and Plan: * CAP (community acquired pneumonia) No fever, but patient has been having cough and expectoration for almost 10 days.  CBC is currently pending.  I reviewed CAT scan angiography images myself.  Patient does seem to have air bronchograms with consolidation right more than left in the bases posteriorly.  With associated right more than left but small pleural effusions not amenable to tapping.  Will request a swallow screen evaluation rule out aspiration.  Patient s/p ceftriaxone  and azithromycin.  Patient viral panel limited is negative.  Will treat with Unasyn plus azithromycin for any aspiration event.  COPD (chronic obstructive pulmonary disease) (HCC) Chronic 4lpm oxygen. C.w.  albuterol , dulrea, monteleukost  CKD (chronic kidney disease) Cr at baseline. C.w. lasix  Filling defect on imaging study I carefully reviewd images of CT scan. Althgouth not identified by radiology, I think it is visible on Image 254/407 in transverse sections, image135/188 in sagital section and 55/125 in coronal section. This does not appear like a PE at all. I do not think it warrants anticoagualtion tonight, especially in view of aptient recent GI bleed for which colonoscopy is penidng. Out of abundance of caustion, will check lower extermity doppler to asses any other clotting site. I am not sure what the filling defect is however, consider expert input in AM  CAD S/P percutaneous coronary angioplasty C/w aspirin, statin metoprolol  Gastrointestinal hemorrhage with melena Remote. Cw.. pantop 40 bid  Crohn's disease (HCC) Not in flare. C.w. pentasa      Advance Care Planning:   Code Status: Prior patient wishes to be DNR DNI  Consults: none  Family Communication: family was at bedside, earlier, left. Call in AM  Severity of Illness: The appropriate patient status for this patient is INPATIENT. Inpatient status is judged to be reasonable and necessary in order to provide the required intensity of service to ensure the patient's safety. The patient's presenting symptoms, physical exam findings, and initial radiographic and laboratory data in the context of their chronic comorbidities is felt to place them at high risk for further clinical deterioration. Furthermore, it is not anticipated that the patient will be medically stable for discharge from the hospital within 2 midnights of admission.   * I certify that at the point of admission it is my clinical judgment that the  patient will require inpatient hospital care spanning beyond 2 midnights from the point of admission due to high intensity of service, high risk for further deterioration and high frequency of surveillance required.*  Author: Nolberto Hanlon, MD 02/07/2023 9:50 PM  For on call review [email or web address].

## 2023-02-07 NOTE — Assessment & Plan Note (Signed)
Remote. Cw.. pantop 40 bid

## 2023-02-07 NOTE — Assessment & Plan Note (Signed)
Cr at baseline. C.w. lasix

## 2023-02-07 NOTE — Assessment & Plan Note (Signed)
C/w aspirin, statin metoprolol

## 2023-02-07 NOTE — Assessment & Plan Note (Signed)
Chronic 4lpm oxygen. C.w.  albuterol , dulrea, monteleukost

## 2023-02-07 NOTE — Progress Notes (Addendum)
Pharmacy Antibiotic Note  Felicia Benson is a 89 y.o. female admitted on 02/07/2023 from SNF with generalized weakness/fatigue, now concerns for aspiration pneumonia. Pharmacy has been consulted for Unasyn dosing.  -Azithro x1 in ED -Resp panel negative  Plan: -Unasyn 3g IV every 6 hours -Azithromycin 500mg  IV every 24 hours -Monitor renal function -Follow up signs of clinical improvement, LOT, de-escalation of antibiotics    Temp (24hrs), Avg:98.6 F (37 C), Min:98.6 F (37 C), Max:98.6 F (37 C)  Recent Labs  Lab 02/07/23 1635 02/07/23 2155  WBC  --  4.6  CREATININE 1.04*  --     CrCl cannot be calculated (Unknown ideal weight.).    Allergies  Allergen Reactions   Motrin [Ibuprofen] Other (See Comments)    GI bleed   Ace Inhibitors Cough   Breztri Aerosphere [Budeson-Glycopyrrol-Formoterol] Hypertension   Cipro [Ciprofloxacin Hcl] Other (See Comments)    Avoid fluoroquinolones due to asc-aortic aneurysm   Levaquin [Levofloxacin] Other (See Comments)    Avoid fluoroquinolones due to asc-aortic aneurysm   Nsaids Nausea And Vomiting and Other (See Comments)    Hx of GI bleed    Antimicrobials this admission: Unasyn 1/31 >>  Azithromycin 1/30 >>  Thank you for allowing pharmacy to be a part of this patient's care.  Arabella Merles, PharmD. Clinical Pharmacist 02/07/2023 10:27 PM

## 2023-02-07 NOTE — Assessment & Plan Note (Signed)
Not in flare. C.w. pentasa

## 2023-02-07 NOTE — ED Provider Notes (Addendum)
[place] [facility]ENCY DEPARTMENT AT [facility] Provider Note   CSN: [id number] Arrival date & time: 02/07/23  1404     History  No chief complaint on file.   Felicia Benson is a 89 y.o. female with COPD on 4 L nasal cannula at baseline, Crohn's disease, CKD stage IIIa, chronic diastolic heart failure, CAD, AAA, OSA, pacemaker in place who presents BIB GEMS from home after being discharged from [facility] yesterday after being hospitalized in December for GIB d/t Crohn's disease and a recent right hip fracture. She reports significant generalized weakness/fatigue, unable to get up out of bed as she normally would. Also w/ DOE with any movement/activity. Diagnosed with left sided pneumonia three weeks ago, treated w/ 5 days of doxycyline, but patient states her cough hasn't improved, productive of sputum, and today streaked with blood. Wears 4L [place] at baseline, hasn't had to increase it. Sats today have been high 80s/low 90s%. She denies CP, f/c, N/V, abd pain, leg swelling.   Past Medical History:  Diagnosis Date   AAA (abdominal aortic aneurysm) (HCC)    CHF (congestive heart failure) (HCC)    Crohn's disease (HCC)    Diverticulitis    Diverticulosis    GERD (gastroesophageal reflux disease)    GI bleed    Hypertension    OSA (obstructive sleep apnea)    Pacemaker    Thyroid nodule        Home Medications Prior to Admission medications   Medication Sig Start Date End Date Taking? Authorizing Provider  albuterol (VENTOLIN HFA) 108 (90 Base) MCG/ACT inhaler Inhale 2 puffs into the lungs every 6 (six) hours as needed for wheezing or shortness of breath. 07/24/22   [name], MD  Amino Acids-Protein Hydrolys (PRO-STAT) LIQD Take 30 mLs by mouth 2 (two) times daily.    [provider]  aspirin EC 81 MG tablet Take 1 tablet (81 mg total) by mouth daily. Swallow whole. 03/29/21   [name], MD  atorvastatin (LIPITOR) 40 MG tablet Take 40 mg by mouth  every evening.    [provider]  docusate sodium (COLACE) 100 MG capsule Take 1 capsule (100 mg total) by mouth daily. 01/04/23   [name], DO  fluticasone (FLONASE) 50 MCG/ACT nasal spray Place 2 sprays into both nostrils daily. Patient taking differently: Place 2 sprays into both nostrils in the morning. 08/06/22   Cobb, Ruby Cola, NP  furosemide (LASIX) 20 MG tablet Take 60 mg by mouth in the morning.    [provider]  lidocaine (LIDODERM) 5 % Place 1 patch onto the skin See admin instructions. Apply 1 new patch to the lower back in the morning- Remove & Discard patch within 12 hours or as directed by MD    [provider]  loratadine (CLARITIN) 10 MG tablet Take 10 mg by mouth daily.    [provider]  meclizine (ANTIVERT) 12.5 MG tablet Take 12.5 mg by mouth every 8 (eight) hours as needed for dizziness.    [provider]  melatonin 3 MG TABS tablet Take 1 tablet (3 mg total) by mouth at bedtime. 07/24/22   [name], MD  mometasone-formoterol [facility]) 100-5 MCG/ACT AERO Inhale 2 puffs into the lungs in the morning and at bedtime.    [provider]  montelukast (SINGULAIR) 10 MG tablet Take 1 tablet (10 mg total) by mouth daily. 08/01/21   Virl Diamond A, MD  pantoprazole (PROTONIX) 40 MG tablet Take  40 mg by mouth in the morning and at bedtime.    [provider]  PENTASA 500 MG CR capsule Take 1,000 mg by mouth in the morning and at bedtime.    [provider]  polyethylene glycol (MIRALAX / GLYCOLAX) 17 g packet Take 17 g by mouth daily as needed. Patient taking differently: Take 17 g by mouth daily as needed for mild constipation (to be mixed into 4 ounces of water). 07/24/22   [name], MD  potassium chloride SA (KLOR-CON M) 20 MEQ tablet Take 40 mEq by mouth daily.    [provider]  Propyl Glycol-Hydroxyethylcell (NASAL MOIST) GEL Place 1 application  into both nostrils in the  morning and at bedtime.    [provider]  TYLENOL 500 MG tablet Take 1,000 mg by mouth every 8 (eight) hours as needed (for pain).    [provider]      Allergies    Ibuprofen, Ace inhibitors, Breztri aerosphere [budeson-glycopyrrol-formoterol], Ciprofloxacin, Levofloxacin, and Nsaids    Review of Systems   Review of Systems A 10 point review of systems was performed and is negative unless otherwise reported in HPI.  Physical Exam Updated Vital Signs BP 112/69   Pulse 88   Temp 98.6 F (37 C)   Resp 19   SpO2 99%  Physical Exam General: Normal appearing elderly female, lying in bed.  HEENT: PERRLA, Sclera anicteric, MMM, trachea midline.  Cardiology: RRR, no murmurs/rubs/gallops.  Resp: Normal respiratory effort, mild tachypnea. CTAB, no wheezes, rhonchi, crackles.  Abd: Soft, non-tender, non-distended. No rebound tenderness or guarding.  GU: Deferred. MSK: No peripheral edema or signs of trauma. Extremities without deformity or TTP. No cyanosis or clubbing. Skin: warm, dry.  Neuro: A&Ox4, CNs II-XII grossly intact. MAEs. Sensation grossly intact.  Psych: Normal mood and affect.   ED Results / Procedures / Treatments   Labs (all labs ordered are listed, but only abnormal results are displayed) Labs Reviewed  RESP PANEL BY RT-PCR (RSV, FLU A&B, COVID)  [id number]  COMPREHENSIVE METABOLIC PANEL  CBC  BRAIN NATRIURETIC PEPTIDE  I-STAT VENOUS BLOOD GAS, ED    EKG EKG Interpretation Date/Time:  Thursday February 07 2023 14:27:44 EST Ventricular Rate:  79 PR Interval:  221 QRS Duration:  146 QT Interval:  417 QTC Calculation: 478 R Axis:   256  Text Interpretation: Sinus rhythm Atrial premature complex Prolonged PR interval Nonspecific IVCD with LAD Anterolateral infarct, old Similar to prior Confirmed by Vivi Barrack [phone]) on 02/07/2023 3:25:19 PM  Radiology DG Chest Port 1 View Result Date: 02/07/2023 CLINICAL DATA:  Lethargy and weakness. EXAM:  PORTABLE CHEST 1 VIEW COMPARISON:  Chest radiograph dated November 09, 2022. FINDINGS: Stable cardiomediastinal silhouette. Stable dual lead left-sided pacemaker in place. Aortic atherosclerosis. Streaky bibasilar opacities. Similar blunting of the left costophrenic angle. No pneumothorax. Partially visualized postsurgical changes of the cervical spine. Surgical staples along the left inferomedial neck. No acute osseous abnormality. IMPRESSION: 1. Streaky bibasilar opacities could reflect atelectasis, edema, or infiltrate. 2. Similar blunting of the left costophrenic angle may reflect a trace pleural effusion versus overlying soft tissue. Electronically Signed   By: Hart Robinsons M.D.   On: 02/07/2023 15:23    Procedures Procedures    Medications Ordered in ED Medications  ipratropium-albuterol (DUONEB) 0.5-2.5 (3) MG/3ML nebulizer solution 3 mL (3 mLs Nebulization Given 02/07/23 1442)    ED Course/ Medical Decision Making/ A&P  Medical Decision Making Amount and/or Complexity of Data Reviewed Labs: ordered. Radiology: ordered. Decision-making details documented in ED Course.  Risk Prescription drug management. Decision regarding hospitalization.    This patient presents to the ED for concern of SOB/gen weakenss, this involves an extensive number of treatment options, and is a complaint that carries with it a high risk of complications and morbidity.  I considered the following differential and admission for this acute, potentially life threatening condition.   MDM:    DDX for dyspnea includes but is not limited to:  Consider unresolved pneumonia as patient was originally diagnosed with pneumonia and treated antibiotics but states she does not get better, continuing to cough and have sputum production.  No fever that she is aware of.  No tachycardia, but she is mildly tachypneic.  Repeat chest x-ray demonstrates streaky bibasilar opacities that could be  consistent with continued pneumonia.  Will treat with ceftriaxone/azithromycin.  She has no chest pain to indicate ACS.  EKG demonstrates paced rhythm similar to her priors.  She does have a trace pleural effusion possibly on chest x-ray but she does not appear significantly volume overloaded to indicate pulmonary edema. Patient did have recent hospitalization and surgery and stay at the nursing home, will obtain CT PE to rule out PE.  She also has a history of COPD chronically on oxygen but she does not have significant wheezing at this time and her symptoms are not that improved by a DuoNeb here.   Clinical Course as of 02/07/23 1557  Thu Feb 07, 2023  1547 DG Chest [place] 1 View 1. Streaky bibasilar opacities could reflect atelectasis, edema, or infiltrate. 2. Similar blunting of the left costophrenic angle may reflect a trace pleural effusion versus overlying soft tissue.   [HN]  1547 Will give abx for continued PNA.  [HN]    Clinical Course User Index [HN] Loetta Rough, MD    Labs: I Ordered, and personally interpreted labs.  The pertinent results include:  those listed above  Imaging Studies ordered: I ordered imaging studies including CXR I independently visualized and interpreted imaging. I agree with the radiologist interpretation  Additional history obtained from chart review.    Cardiac Monitoring: The patient was maintained on a cardiac monitor.  I personally viewed and interpreted the cardiac monitored which showed an underlying rhythm of: NSR  Reevaluation: After the interventions noted above, I reevaluated the patient and found that they have :stayed the same  Social Determinants of Health: Lives independently  Disposition:  Patient is signed out to the oncoming ED physician Dr. Suezanne Jacquet who is made aware of her history, presentation, exam, workup, and plan. Plan is to obtain labs, CT PE, and admit to hospitalist.    Co morbidities that complicate the patient  evaluation  Past Medical History:  Diagnosis Date   AAA (abdominal aortic aneurysm) (HCC)    CHF (congestive heart failure) (HCC)    Crohn's disease (HCC)    Diverticulitis    Diverticulosis    GERD (gastroesophageal reflux disease)    GI bleed    Hypertension    OSA (obstructive sleep apnea)    Pacemaker    Thyroid nodule      Medicines Meds ordered this encounter  Medications   ipratropium-albuterol (DUONEB) 0.5-2.5 (3) MG/3ML nebulizer solution 3 mL    I have reviewed the patients home medicines and have made adjustments as needed  Problem List / ED Course: Problem List Items Addressed This Visit   None  Visit Diagnoses       Shortness of breath    -  Primary     Community acquired pneumonia, unspecified laterality       Relevant Medications   ipratropium-albuterol (DUONEB) 0.5-2.5 (3) MG/3ML nebulizer solution 3 mL (Completed)   cefTRIAXone (ROCEPHIN) 1 g in sodium chloride 0.9 % 100 mL IVPB   azithromycin (ZITHROMAX) 500 mg in sodium chloride 0.9 % 250 mL IVPB          Loetta Rough, MD 02/07/23 1608

## 2023-02-07 NOTE — Assessment & Plan Note (Signed)
No fever, but patient has been having cough and expectoration for almost 10 days.  CBC is currently pending.  I reviewed CAT scan angiography images myself.  Patient does seem to have air bronchograms with consolidation right more than left in the bases posteriorly.  With associated right more than left but small pleural effusions not amenable to tapping.  Will request a swallow screen evaluation rule out aspiration.  Patient s/p ceftriaxone and azithromycin.  Patient viral panel limited is negative.  Will treat with Unasyn plus azithromycin for any aspiration event.

## 2023-02-08 ENCOUNTER — Encounter (HOSPITAL_COMMUNITY): Payer: Self-pay | Admitting: Internal Medicine

## 2023-02-08 ENCOUNTER — Inpatient Hospital Stay (HOSPITAL_COMMUNITY): Payer: Medicare Other

## 2023-02-08 DIAGNOSIS — R0600 Dyspnea, unspecified: Secondary | ICD-10-CM | POA: Diagnosis not present

## 2023-02-08 DIAGNOSIS — M7989 Other specified soft tissue disorders: Secondary | ICD-10-CM

## 2023-02-08 DIAGNOSIS — I2693 Single subsegmental pulmonary embolism without acute cor pulmonale: Secondary | ICD-10-CM

## 2023-02-08 DIAGNOSIS — I2699 Other pulmonary embolism without acute cor pulmonale: Secondary | ICD-10-CM

## 2023-02-08 DIAGNOSIS — N1831 Chronic kidney disease, stage 3a: Secondary | ICD-10-CM

## 2023-02-08 DIAGNOSIS — J189 Pneumonia, unspecified organism: Secondary | ICD-10-CM | POA: Diagnosis not present

## 2023-02-08 DIAGNOSIS — J9601 Acute respiratory failure with hypoxia: Secondary | ICD-10-CM | POA: Diagnosis present

## 2023-02-08 LAB — CBC
HCT: 29 % — ABNORMAL LOW (ref 36.0–46.0)
Hemoglobin: 8.5 g/dL — ABNORMAL LOW (ref 12.0–15.0)
MCH: 28.8 pg (ref 26.0–34.0)
MCHC: 29.3 g/dL — ABNORMAL LOW (ref 30.0–36.0)
MCV: 98.3 fL (ref 80.0–100.0)
Platelets: 280 10*3/uL (ref 150–400)
RBC: 2.95 MIL/uL — ABNORMAL LOW (ref 3.87–5.11)
RDW: 18.6 % — ABNORMAL HIGH (ref 11.5–15.5)
WBC: 5.1 10*3/uL (ref 4.0–10.5)
nRBC: 0 % (ref 0.0–0.2)

## 2023-02-08 LAB — BASIC METABOLIC PANEL
Anion gap: 10 (ref 5–15)
BUN: 24 mg/dL — ABNORMAL HIGH (ref 8–23)
CO2: 27 mmol/L (ref 22–32)
Calcium: 7.6 mg/dL — ABNORMAL LOW (ref 8.9–10.3)
Chloride: 98 mmol/L (ref 98–111)
Creatinine, Ser: 1.09 mg/dL — ABNORMAL HIGH (ref 0.44–1.00)
GFR, Estimated: 48 mL/min — ABNORMAL LOW (ref >60)
Glucose, Bld: 76 mg/dL (ref 70–99)
Potassium: 4.9 mmol/L (ref 3.5–5.1)
Sodium: 135 mmol/L (ref 135–145)

## 2023-02-08 LAB — ECHOCARDIOGRAM COMPLETE
Height: 66 in
S' Lateral: 3.3 cm
Weight: 3008.84 [oz_av]

## 2023-02-08 LAB — PROTIME-INR
INR: 1.3 — ABNORMAL HIGH (ref 0.8–1.2)
Prothrombin Time: 16.1 s — ABNORMAL HIGH (ref 11.4–15.2)

## 2023-02-08 LAB — TROPONIN I (HIGH SENSITIVITY): Troponin I (High Sensitivity): 23 ng/L — ABNORMAL HIGH (ref <18)

## 2023-02-08 LAB — HEPARIN LEVEL (UNFRACTIONATED): Heparin Unfractionated: 0.75 [IU]/mL — ABNORMAL HIGH (ref 0.30–0.70)

## 2023-02-08 LAB — APTT: aPTT: 30 s (ref 24–36)

## 2023-02-08 MED ORDER — ONDANSETRON HCL 4 MG/2ML IJ SOLN
4.0000 mg | Freq: Four times a day (QID) | INTRAMUSCULAR | Status: DC | PRN
  Administered 2023-02-10 – 2023-02-13 (×5): 4 mg via INTRAVENOUS
  Filled 2023-02-08 (×6): qty 2

## 2023-02-08 MED ORDER — ENSURE ENLIVE PO LIQD
237.0000 mL | Freq: Two times a day (BID) | ORAL | Status: DC
  Administered 2023-02-08: 237 mL via ORAL

## 2023-02-08 MED ORDER — MELATONIN 3 MG PO TABS
3.0000 mg | ORAL_TABLET | Freq: Every day | ORAL | Status: DC
  Administered 2023-02-08 – 2023-02-18 (×11): 3 mg via ORAL
  Filled 2023-02-08 (×11): qty 1

## 2023-02-08 MED ORDER — BENZONATATE 100 MG PO CAPS
100.0000 mg | ORAL_CAPSULE | Freq: Three times a day (TID) | ORAL | Status: DC
  Administered 2023-02-08 – 2023-02-09 (×5): 100 mg via ORAL
  Filled 2023-02-08 (×5): qty 1

## 2023-02-08 MED ORDER — HEPARIN (PORCINE) 25000 UT/250ML-% IV SOLN
1000.0000 [IU]/h | INTRAVENOUS | Status: DC
  Administered 2023-02-08: 1250 [IU]/h via INTRAVENOUS
  Administered 2023-02-09: 1150 [IU]/h via INTRAVENOUS
  Filled 2023-02-08 (×3): qty 250

## 2023-02-08 MED ORDER — GUAIFENESIN ER 600 MG PO TB12
600.0000 mg | ORAL_TABLET | Freq: Two times a day (BID) | ORAL | Status: DC
  Administered 2023-02-08 – 2023-02-19 (×24): 600 mg via ORAL
  Filled 2023-02-08 (×24): qty 1

## 2023-02-08 MED ORDER — FUROSEMIDE 10 MG/ML IJ SOLN
20.0000 mg | Freq: Once | INTRAMUSCULAR | Status: AC
  Administered 2023-02-08: 20 mg via INTRAVENOUS
  Filled 2023-02-08: qty 2

## 2023-02-08 MED ORDER — MECLIZINE HCL 25 MG PO TABS
12.5000 mg | ORAL_TABLET | Freq: Three times a day (TID) | ORAL | Status: DC | PRN
  Administered 2023-02-08: 12.5 mg via ORAL
  Filled 2023-02-08 (×2): qty 1

## 2023-02-08 MED ORDER — HEPARIN BOLUS VIA INFUSION
2000.0000 [IU] | Freq: Once | INTRAVENOUS | Status: AC
  Administered 2023-02-08: 2000 [IU] via INTRAVENOUS
  Filled 2023-02-08: qty 2000

## 2023-02-08 NOTE — Progress Notes (Signed)
Modified Barium Swallow Study  Patient Details  Name: Felicia Benson MRN: [id number] Date of Birth: March 21, 1930  Today's Date: 02/08/2023  Modified Barium Swallow completed.  Full report located under Chart Review in the Imaging Section.  History of Present Illness Felicia Benson is a 89 y.o. female with medical history significant of osteoarthritis, Crohn's disease, GERD, hypertension, abdominal aortic aneurysm . Displaced right intertrochanteric femur fracture after a mechanical fall -underwent ORIF by orthopedics on 10/30/2022.subsequent re-hospitalisation and discharged on January 03, 2023 after she was admitted for complaint of rectal bleed suspected to be diverticular in nature requiring several units of blood transfusion patient tells me 3.  This was managed medically CT angiography did not show active bleed.  Although diverticulosis was seen. Patient was at rehab facility.  Has been complaining of cough with some scant sputum production as well as sensation of shortness of breath, fatigue, poor appetite for the last 7 days or so.  Patient was subsequently noted to have sats of 90% by the facility on her chronic 4 L/min of supplementary oxygen.  ER workup is done with CT angiography.  Small segmental pulmonary artery filling defect in the right lower lobe.  Interstitial and groundglass opacities in the lungs bilaterally possible edema or infiltrate.  Patchy airspace disease in the lower lobes bilaterally possible atelectasis or interval infiltrate.  Mild bilateral pleural effusions.   Clinical Impression Patient presents with a mild, largely age related oropharyngeal dysphagia characterized by mildly reduced base of tongue and laryngeal weakness and age appropriate initiation of the swallow at the level of the vallecula and pyriform sinuses with intermittent trace penetration of thin liquids. Even when challenged with multiple consecutive large boluses, penetrates were minimal and remained  above the vocal cords, clearing with subsequent swallows and cued throat clear. Do not feel that this degree of deficit would account for multifocal PNA. Educated patient on benefits of small sips, slow rate, and possible occassional throat clear to decrease aspiration risks. Patient verbalized understanding. No further SLP needs indicated. Factors that may increase risk of adverse event in presence of aspiration Rubye Oaks & Clearance Coots 2021):    Swallow Evaluation Recommendations Recommendations: PO diet PO Diet Recommendation: Regular;Thin liquids (Level 0) Liquid Administration via: Cup;Straw Medication Administration: Whole meds with liquid Supervision: Patient able to self-feed Swallowing strategies  : Slow rate;Small bites/sips;Clear throat intermittently Postural changes: Position pt fully upright for meals Oral care recommendations: Oral care BID (2x/day)     Lenette Rau MA, CCC-SLP  Danya Spearman Meryl 02/08/2023,11:17 AM

## 2023-02-08 NOTE — Final Consult Note (Signed)
NAME:  Felicia Benson, MRN:  [id number], DOB:  1930/07/24, LOS: 1 ADMISSION DATE:  02/07/2023, CONSULTATION DATE:  02/08/23 REFERRING MD:  TRH, CHIEF COMPLAINT:  Dyspnea   History of Present Illness:  89 y.o. woman admitted with shortness of breath found to have tiny subsegmental pulmonary embolism whom we are consulted for recommendations regarding treatment of pulmonary embolism.  She denies really any symptoms to me at time of evaluation.  Breathing seems stable.  She is comfortable.  Saturations at 100% on minimal oxygen supplementation.  Per review of H&P sounds like she has had shortness breath for few days.  She states she was discharged on azithromycin after recent hospitalization for presumed pneumonia per her report.  Only received 2 days prior to discharge but then the rehab facility stopped given her antibiotics.  She pleaded with him to resume antibiotics but it took several days.  In the interim she got worse more short of breath cough etc.  This prompted presentation to the ED.  CTA PE protocol shows tiny subsegmental PE, bilateral infiltrates in the lower lung fields, bilateral pleural effusions concerning for possible pneumonia versus CHF exacerbation.  Pertinent  Medical History  Recent GI bleed  Significant Hospital Events: Including procedures, antibiotic start and stop dates in addition to other pertinent events   Admitted to the hospital with symptoms of upper respiratory infection  Interim History / Subjective:    Objective   Blood pressure 113/66, pulse (!) 108, temperature 98.2 F (36.8 C), resp. rate 18, height 5\' 6"  (1.676 m), weight 85.3 kg, SpO2 91%.        Intake/Output Summary (Last 24 hours) at 02/08/2023 1025 Last data filed at 02/08/2023 0540 Gross per 24 hour  Intake 200 ml  Output --  Net 200 ml   Filed Weights   02/07/23 2200  Weight: 85.3 kg    Examination: General: Lying in bed, no acute distress HENT: Atraumatic normocephalic PERRL no  icterus Lungs: Scattered rhonchi otherwise clear normal work of breathing on room air O2 sats 100% on minimal oxygen supplementation Cardiovascular: Tachycardic, regular rhythm Abdomen: Nondistended, bowel sounds present MSK: No synovitis, no joint effusion Neuro: Alert, communicative, oriented   Resolved Hospital Problem list     Assessment & Plan:   Subsegmental provoked low risk pulmonary embolism: Very small.  Unlikely to be significant contributors to symptoms.   Provoked in the setting of recent hip surgery and immobility with hospitalizations etc. -- Given her recent hematochezia, she is a poor anticoagulation candidate -- Given small size of PE and other contributors to her presenting symptoms other than PE, recommend no anticoagulation at this time, if symptoms or physiology were to worsen then would need to reconsider at the discretion of the primary team -- Recommend lower extremity venous Dopplers -- If DVT is present, recommend the primary team have a conversation with the patient and decision makers regarding trial of anticoagulation versus placement of IVC filter  Shortness of breath: Most likely bigger contributors include infiltrates possible pneumonia, evidence of volume overload. -- Management per primary team  PCCM will sign off  Best Practice (right click and "Reselect all SmartList Selections" daily)   Per primary  Labs   CBC: Recent Labs  Lab 02/07/23 1645 02/07/23 2155 02/08/23 0509  WBC  --  4.6 5.1  HGB 9.2* 8.3* 8.5*  HCT 27.0* 28.0* 29.0*  MCV  --  97.6 98.3  PLT  --  293 280    Basic Metabolic Panel: Recent  Labs  Lab 02/07/23 1635 02/07/23 1645 02/08/23 0509  NA 137 138 135  K 4.3 4.2 4.9  CL 100  --  98  CO2 26  --  27  GLUCOSE 92  --  76  BUN 26*  --  24*  CREATININE 1.04*  --  1.09*  CALCIUM 7.8*  --  7.6*   GFR: Estimated Creatinine Clearance: 37.7 mL/min (A) (by C-G formula based on SCr of 1.09 mg/dL (H)). Recent Labs  Lab  02/07/23 2155 02/08/23 0509  WBC 4.6 5.1    Liver Function Tests: Recent Labs  Lab 02/07/23 1635  AST 18  ALT 8  ALKPHOS 70  BILITOT 0.5  PROT 5.3*  ALBUMIN 2.1*   No results for input(s): "LIPASE", "AMYLASE" in the last 168 hours. No results for input(s): "AMMONIA" in the last 168 hours.  ABG    Component Value Date/Time   HCO3 30.8 (H) 02/07/2023 1645   TCO2 32 02/07/2023 1645   ACIDBASEDEF 0.4 07/22/2022 0039   O2SAT 90 02/07/2023 1645     Coagulation Profile: Recent Labs  Lab 02/08/23 0509  INR 1.3*    Cardiac Enzymes: No results for input(s): "CKTOTAL", "CKMB", "CKMBINDEX", "TROPONINI" in the last 168 hours.  HbA1C: Hgb A1c MFr Bld  Date/Time Value Ref Range Status  12/31/2020 06:24 AM 5.8 (H) 4.8 - 5.6 % Final    Comment:    (NOTE) Pre diabetes:          5.7%-6.4%  Diabetes:              >6.4%  Glycemic control for   <7.0% adults with diabetes     CBG: No results for input(s): "GLUCAP" in the last 168 hours.  Review of Systems:   Denies chest pain or significant shortness of breath.  Comprehensive review of systems otherwise negative.  Past Medical History:  She,  has a past medical history of AAA (abdominal aortic aneurysm) (HCC), CHF (congestive heart failure) (HCC), Crohn's disease (HCC), Diverticulitis, Diverticulosis, GERD (gastroesophageal reflux disease), GI bleed, Hypertension, OSA (obstructive sleep apnea), Pacemaker, and Thyroid nodule.   Surgical History:   Past Surgical History:  Procedure Laterality Date   CHOLECYSTECTOMY     COLONOSCOPY WITH PROPOFOL N/A 06/01/2015   Procedure: COLONOSCOPY WITH PROPOFOL;  Surgeon: Charlott Rakes, MD;  Location: [facility] ENDOSCOPY;  Service: Endoscopy;  Laterality: N/A;   CORONARY ANGIOPLASTY WITH STENT PLACEMENT     ESOPHAGOGASTRODUODENOSCOPY (EGD) WITH PROPOFOL N/A 06/01/2015   Procedure: ESOPHAGOGASTRODUODENOSCOPY (EGD) WITH PROPOFOL;  Surgeon: Charlott Rakes, MD;  Location: [facility] ENDOSCOPY;   Service: Endoscopy;  Laterality: N/A;   GIVENS CAPSULE STUDY N/A 06/03/2015   Procedure: GIVENS CAPSULE STUDY;  Surgeon: Charlott Rakes, MD;  Location: [facility] ENDOSCOPY;  Service: Endoscopy;  Laterality: N/A;   INTRAMEDULLARY (IM) NAIL INTERTROCHANTERIC Right 10/30/2022   Procedure: INTRAMEDULLARY nailing of right femur;  Surgeon: Joen Laura, MD;  Location: [facility] OR;  Service: Orthopedics;  Laterality: Right;   PACEMAKER PLACEMENT  2015     Social History:   reports that she has quit smoking. She has never used smokeless tobacco. She reports that she does not drink alcohol and does not use drugs.   Family History:  Her family history includes CVA in her mother; Lung cancer in her father. There is no history of Colon cancer.   Allergies Allergies  Allergen Reactions   Motrin [Ibuprofen] Other (See Comments)    GI bleed   Ace Inhibitors Cough   Breztri Aerosphere [Budeson-Glycopyrrol-Formoterol] Hypertension  Cipro [Ciprofloxacin Hcl] Other (See Comments)    Avoid fluoroquinolones due to asc-aortic aneurysm   Levaquin [Levofloxacin] Other (See Comments)    Avoid fluoroquinolones due to asc-aortic aneurysm   Nsaids Nausea And Vomiting and Other (See Comments)    Hx of GI bleed     Home Medications  Prior to Admission medications   Medication Sig Start Date End Date Taking? Authorizing Provider  albuterol (VENTOLIN HFA) 108 (90 Base) MCG/ACT inhaler Inhale 2 puffs into the lungs every 6 (six) hours as needed for wheezing or shortness of breath. 07/24/22  Yes [name], MD  aspirin EC 81 MG tablet Take 1 tablet (81 mg total) by mouth daily. Swallow whole. 03/29/21  Yes [name], MD  atorvastatin (LIPITOR) 40 MG tablet Take 40 mg by mouth at bedtime.   Yes [provider]  Cholecalciferol (VITAMIN D-3 PO) Take 1 capsule by mouth daily.   Yes [provider]  docusate sodium (COLACE) 100 MG capsule Take 1 capsule (100 mg total) by mouth daily. 01/04/23   Yes [name], DO  ferrous sulfate 325 (65 FE) MG tablet Take 325 mg by mouth daily.   Yes [provider]  furosemide (LASIX) 40 MG tablet Take 40 mg by mouth daily.   Yes [provider]  metoprolol succinate (TOPROL-XL) 25 MG 24 hr tablet Take 25 mg by mouth daily.   Yes [provider]  mometasone-formoterol (DULERA) 200-5 MCG/ACT AERO Inhale 2 puffs into the lungs 2 (two) times daily.   Yes [provider]  montelukast (SINGULAIR) 10 MG tablet Take 1 tablet (10 mg total) by mouth daily. 08/01/21  Yes [name], Adewale A, MD  pantoprazole (PROTONIX) 40 MG tablet Take 40 mg by mouth in the morning and at bedtime.   Yes [provider]  PENTASA 500 MG CR capsule Take 1,000 mg by mouth in the morning and at bedtime.   Yes [provider]  potassium chloride SA (KLOR-CON M) 20 MEQ tablet Take 40 mEq by mouth daily.   Yes [provider]     Critical care time: n/a    Karren Burly, MD See Loretha Stapler

## 2023-02-08 NOTE — Progress Notes (Signed)
F/u discussion with Dr. Isidoro Donning via secure chat. She wishes to proceed with MBS. Will attempt to schedule for today.   Rutilio Yellowhair MA, CCC-SLP

## 2023-02-08 NOTE — Progress Notes (Addendum)
[facility]                                                                              Felicia Benson, is a 89 y.o. female, DOB - 1930-03-31, WUJ:[id number] Admit date - 02/07/2023    Outpatient Primary MD for the patient is [name], MD  LOS - 1  days  Chief Complaint  Patient presents with   Pneumonia       Brief summary   Patient is a 89 year old female with osteoarthritis, Crohn's disease, GERD, HTN, AAA, admitted in 10/2022 with right hip fracture after mechanical fall and underwent ORIF, hospitalization in 12/2022 for rectal bleeding suspected to be diverticular bleeding 3 RBC transfusions.  CT angiography did not show active bleed.  Patient presented from SNF with cough, shortness of breath, fatigue, poor appetite for a week.  She was noted to have O2 sats of 90% by the facility on her chronic 4 L O2.  Denied any fevers, leg swelling, chest pain or palpitations. CTA chest showed small segmental pulmonary artery filling defect in the right lower lobe, interstitial and groundglass opacities in the lungs bilaterally, possible edema or infiltrate.  Patchy airspace disease in the lower lobes bilaterally.  Mild bilateral pleural effusions.   Assessment & Plan    Principal Problem:   CAP (community acquired pneumonia), multifocal Acute respiratory failure with hypoxia -SLP evaluation done, will proceed with MBS to rule out silent aspiration -Continue O2, currently on IV Unasyn, Zithromax -Flu, COVID, RSV negative -Mild elevated troponin 23-23, likely demand ischemia   Volume overload with bilateral pleural effusions, mild acute on chronic diastolic CHF -2D echo 07/2022 had shown EF 60 to 65%, G1 DD -BNP 829.2, CTA with bilateral pleural effusions -Continue Lasix 40 mg PO daily, will give extra dose of Lasix 20 mg IV x 1 - strict I's and O's and daily weights    Acute pulmonary embolism (HCC), subsegmental PE. -CTA chest showed small subsegmental pulmonary  artery filling defect of the right lower lobe.  Right ventricular distended, may reflect right heart strain, interstitial and groundglass opacities in the lungs bilaterally possible edema or infiltrate.  Patchy airspace disease in the lower lobes. -I discussed with radiology, Dr. Llana Aliment who reviewed the CTA and confirmed that patient indeed has small subsegmental PE -Will follow venous Dopplers lower extremity, 2D echo -Will have to weigh risks and benefits of anticoagulation especially with recent diverticular rectal bleeding, anticoagulation versus IVC filter.  -Per pulmonology, given small size of PE, recommended no anticoagulation at this time however if symptomatic, will need to be considered. Addendum 11:47am  Received notification from vascular tech, patient has DVT in the left calf.  Venous Doppler showed acute DVT involving the left peroneal veins -Given acute DVT, subsegmental PE, will proceed with IV heparin drip, follow closely with CBC, any bleeding issues. -If she has any worsening anemia or bleeding, will place IVC filter. D/w Dr Judeth Horn (pulm), agree with the plan.    History of Crohn's disease, recent diverticular bleed in 12/2022, GERD -Continue Protonix -Not in flare, continue Pentasa   CAD status post PCI, HLP -Currently no acute chest  pain, continue aspirin, statin, metoprolol  Chronic kidney disease stage III a -Creatinine currently at baseline, 1.0   Obesity class II Estimated body mass index is 30.35 kg/m as calculated from the following:   Height as of this encounter: 5\' 6"  (1.676 m).   Weight as of this encounter: 85.3 kg.  Code Status: DNR DVT Prophylaxis:  enoxaparin (LOVENOX) injection 40 mg Start: 02/08/23 0800 SCDs Start: 02/07/23 2215   Level of Care: Level of care: Med-Surg Family Communication: Updated patient.  I called patient's daughter [name] on the phone to update but went to voicemail, was not able to make contact Disposition  Plan:      Remains inpatient appropriate:      Procedures:    Consultants:   Pulmonology  Antimicrobials:   Anti-infectives (From admission, onward)    Start     Dose/Rate Route Frequency Ordered Stop   02/08/23 1000  azithromycin (ZITHROMAX) 500 mg in sodium chloride 0.9 % 250 mL IVPB        500 mg 250 mL/hr over 60 Minutes Intravenous Every 24 hours 02/07/23 2158 02/10/23 0959   02/07/23 2300  Ampicillin-Sulbactam (UNASYN) 3 g in sodium chloride 0.9 % 100 mL IVPB        3 g 200 mL/hr over 30 Minutes Intravenous Every 6 hours 02/07/23 2229     02/07/23 1600  cefTRIAXone (ROCEPHIN) 1 g in sodium chloride 0.9 % 100 mL IVPB  Status:  Discontinued        1 g 200 mL/hr over 30 Minutes Intravenous  Once 02/07/23 1548 02/07/23 2229   02/07/23 1600  azithromycin (ZITHROMAX) 500 mg in sodium chloride 0.9 % 250 mL IVPB        500 mg 250 mL/hr over 60 Minutes Intravenous  Once 02/07/23 1548 02/07/23 2047          Medications  aspirin EC  81 mg Oral Daily   atorvastatin  40 mg Oral QHS   benzonatate  100 mg Oral TID   enoxaparin (LOVENOX) injection  40 mg Subcutaneous Q24H   furosemide  40 mg Oral Daily   guaiFENesin  600 mg Oral BID   mesalamine  1,000 mg Oral BID   metoprolol succinate  25 mg Oral Daily   mometasone-formoterol  2 puff Inhalation BID   montelukast  10 mg Oral Daily   pantoprazole  40 mg Oral BID   sodium chloride flush  3 mL Intravenous Q12H      Subjective:   Felicia Benson was seen and examined today.  No acute complaints, still somewhat short of breath, on 4 L O2 via [place].  No acute chest pain, nausea vomiting, dizziness or lightheadedness.    Objective:   Vitals:   02/08/23 0553 02/08/23 0600 02/08/23 0817 02/08/23 0917  BP:  102/66 104/68 113/66  Pulse:  100 (!) 113 (!) 108  Resp:  13 18   Temp: 98.4 F (36.9 C)  98.2 F (36.8 C)   TempSrc: Oral     SpO2:  100% 91%   Weight:      Height:        Intake/Output Summary (Last 24 hours) at  02/08/2023 1036 Last data filed at 02/08/2023 1000 Gross per 24 hour  Intake 203 ml  Output --  Net 203 ml     Wt Readings from Last 3 Encounters:  02/07/23 85.3 kg  12/28/22 85.3 kg  11/13/22 97.6 kg     Exam General: Alert and oriented x  3, NAD Cardiovascular: S1 S2 auscultated,  RRR, tachycardia Respiratory: Bilateral scattered rhonchi Gastrointestinal: Soft, nontender, nondistended, + bowel sounds Ext: no pedal edema bilaterally Neuro: no new deficits Psych: Normal affect     Data Reviewed:  I have personally reviewed following labs    CBC Lab Results  Component Value Date   WBC 5.1 02/08/2023   RBC 2.95 (L) 02/08/2023   HGB 8.5 (L) 02/08/2023   HCT 29.0 (L) 02/08/2023   MCV 98.3 02/08/2023   MCH 28.8 02/08/2023   PLT 280 02/08/2023   MCHC 29.3 (L) 02/08/2023   RDW 18.6 (H) 02/08/2023   LYMPHSABS 1.7 12/30/2022   MONOABS 1.2 (H) 12/30/2022   EOSABS 0.2 12/30/2022   BASOSABS 0.0 12/30/2022     Last metabolic panel Lab Results  Component Value Date   NA 135 02/08/2023   K 4.9 02/08/2023   CL 98 02/08/2023   CO2 27 02/08/2023   BUN 24 (H) 02/08/2023   CREATININE 1.09 (H) 02/08/2023   GLUCOSE 76 02/08/2023   GFRNONAA 48 (L) 02/08/2023   GFRAA 44 (L) 05/21/2019   CALCIUM 7.6 (L) 02/08/2023   PHOS 3.2 05/21/2019   PROT 5.3 (L) 02/07/2023   ALBUMIN 2.1 (L) 02/07/2023   BILITOT 0.5 02/07/2023   ALKPHOS 70 02/07/2023   AST 18 02/07/2023   ALT 8 02/07/2023   ANIONGAP 10 02/08/2023    CBG (last 3)  No results for input(s): "GLUCAP" in the last 72 hours.    Coagulation Profile: Recent Labs  Lab 02/08/23 0509  INR 1.3*     Radiology Studies: I have personally reviewed the imaging studies  CT Angio Chest PE W and/or Wo Contrast Addendum Date: 02/07/2023 ADDENDUM REPORT: 02/07/2023 20:08 ADDENDUM: Critical Value/emergent results were called by telephone at the time of interpretation on 02/07/2023 at 8:07 pm to provider Dr. Joneen Caraway , who  verbally acknowledged these results. Electronically Signed   By: Thornell Sartorius M.D.   On: 02/07/2023 20:08   Result Date: 02/07/2023 CLINICAL DATA:  Pulmonary embolism suspected, high probability. EXAM: CT ANGIOGRAPHY CHEST WITH CONTRAST TECHNIQUE: Multidetector CT imaging of the chest was performed using the standard protocol during bolus administration of intravenous contrast. Multiplanar CT image reconstructions and MIPs were obtained to evaluate the vascular anatomy. RADIATION DOSE REDUCTION: This exam was performed according to the departmental dose-optimization program which includes automated exposure control, adjustment of the mA and/or kV according to patient size and/or use of iterative reconstruction technique. CONTRAST:  75mL OMNIPAQUE IOHEXOL 350 MG/ML SOLN COMPARISON:  07/19/2022. FINDINGS: Cardiovascular: Heart is enlarged and there is no pericardial effusion. Pacemaker leads are noted in the heart. Multi-vessel coronary artery calcifications are present. There is atherosclerotic calcification of the aorta with aneurysmal dilatation of the ascending aorta measuring 4.3 cm. The pulmonary trunk is distended suggesting underlying pulmonary artery hypertension. There is a suspected pulmonary artery filling defect in a segmental pulmonary artery in the right lower lobe. The right ventricle is distended, similar in appearance to the prior exam. Mediastinum/Nodes: Enlarged lymph nodes are present in the mediastinum measuring up to 1.4 cm in the AP window. Enlarged hilar lymph nodes are present bilaterally. No axillary lymphadenopathy. The thyroid gland is enlarged with heterogeneous attenuation. Surgical clips are noted in the region of the left lobe of the thyroid gland. The trachea and esophagus are within normal limits. There is a small hiatal hernia. Lungs/Pleura: Ground-glass opacities and interstitial prominence are present in the lungs bilaterally. There is patchy atelectasis or infiltrate in  the  lower lobes bilaterally. Small bilateral pleural effusions are noted, greater on the right than on the left. No pneumothorax is seen. Upper Abdomen: The gallbladder is surgically absent. Scattered diverticula are noted along the colon without diverticulitis. No acute abnormality. Musculoskeletal: A pacemaker device is noted in the left chest wall. Degenerative changes are noted in the thoracic spine. No acute osseous abnormality is seen. Review of the MIP images confirms the above findings. IMPRESSION: 1. Small segmental pulmonary artery filling defect in the right lower lobe. The right ventricle is distended, similar in appearance to prior exams which may reflect right heart failure. The possibility of superimposed right heart strain can not be excluded. 2. Interstitial and ground-glass opacities in the lungs bilaterally, possible edema or infiltrate. 3. Patchy airspace disease in the lower lobes bilaterally, possible atelectasis or infiltrate. 4. Small bilateral pleural effusions. 5. Coronary artery calcifications. 6. Distended pulmonary trunk suggesting underlying pulmonary artery hypertension. 7. Aortic atherosclerosis with aneurysmal dilatation of the ascending aorta measuring 4.3 cm. Recommend annual imaging followup by CTA or MRA. This recommendation follows 2010 ACCF/AHA/AATS/ACR/ASA/SCA/SCAI/SIR/STS/SVM Guidelines for the Diagnosis and Management of Patients with Thoracic Aortic Disease. Circulation. 2010; 121: W098-J191. Aortic aneurysm NOS (ICD10-I71.9) Electronically Signed: By: Thornell Sartorius M.D. On: 02/07/2023 20:02   DG Chest Port 1 View Result Date: 02/07/2023 CLINICAL DATA:  Lethargy and weakness. EXAM: PORTABLE CHEST 1 VIEW COMPARISON:  Chest radiograph dated November 09, 2022. FINDINGS: Stable cardiomediastinal silhouette. Stable dual lead left-sided pacemaker in place. Aortic atherosclerosis. Streaky bibasilar opacities. Similar blunting of the left costophrenic angle. No pneumothorax.  Partially visualized postsurgical changes of the cervical spine. Surgical staples along the left inferomedial neck. No acute osseous abnormality. IMPRESSION: 1. Streaky bibasilar opacities could reflect atelectasis, edema, or infiltrate. 2. Similar blunting of the left costophrenic angle may reflect a trace pleural effusion versus overlying soft tissue. Electronically Signed   By: Hart Robinsons M.D.   On: 02/07/2023 15:23       Mardene Lessig M.D. [facility] 02/08/2023, 10:36 AM  Available via Epic secure chat 7am-7pm After 7 pm, please refer to night coverage provider listed on amion.

## 2023-02-08 NOTE — Progress Notes (Signed)
PHARMACY - ANTICOAGULATION CONSULT NOTE  Pharmacy Consult for heparin Indication: pulmonary embolus and DVT  Allergies  Allergen Reactions   Motrin [Ibuprofen] Other (See Comments)    GI bleed   Ace Inhibitors Cough   Breztri Aerosphere [Budeson-Glycopyrrol-Formoterol] Hypertension   Cipro [Ciprofloxacin Hcl] Other (See Comments)    Avoid fluoroquinolones due to asc-aortic aneurysm   Levaquin [Levofloxacin] Other (See Comments)    Avoid fluoroquinolones due to asc-aortic aneurysm   Nsaids Nausea And Vomiting and Other (See Comments)    Hx of GI bleed    Patient Measurements: Height: 5\' 6"  (167.6 cm) Weight: 85.3 kg (188 lb 0.8 oz) IBW/kg (Calculated) : 59.3 Heparin Dosing Weight: 77.5kg  Vital Signs: Temp: 97.7 F (36.5 C) (01/31 2127) Temp Source: Oral (01/31 2127) BP: 92/65 (01/31 2127) Pulse Rate: 99 (01/31 2127)  Labs: Recent Labs    02/07/23 1635 02/07/23 1645 02/07/23 1645 02/07/23 2155 02/08/23 0509 02/08/23 2154  HGB  --  9.2*   < > 8.3* 8.5*  --   HCT  --  27.0*  --  28.0* 29.0*  --   PLT  --   --   --  293 280  --   APTT  --   --   --   --  30  --   LABPROT  --   --   --   --  16.1*  --   INR  --   --   --   --  1.3*  --   HEPARINUNFRC  --   --   --   --   --  0.75*  CREATININE 1.04*  --   --   --  1.09*  --   TROPONINIHS  --   --   --  23* 23*  --    < > = values in this interval not displayed.    Estimated Creatinine Clearance: 37.7 mL/min (A) (by C-G formula based on SCr of 1.09 mg/dL (H)).  Assessment: 89 yo female presenting with shortness of breath found to have tiny subsegmental PE, also found to have DVT in L calf. Noted recent diverticular rectal bleeding 12/2022. PCCM consulted - trial anticoag but low threshold for IVC filter if bleeding occurs. Enox prophylaxis dosing given 1/31 @ 0827 but otherwise no AC.  Heparin level slightly supratherapeutic (0.75) on infusion at 1250 units/hr. No bleeding noted.  Goal of Therapy:  Heparin level  0.3-0.7 units/ml Monitor platelets by anticoagulation protocol: Yes   Plan:  Decrease heparin drip to 1150 units/hr Check heparin level in 8 hours  Christoper Fabian, PharmD, BCPS Please see amion for complete clinical pharmacist phone list 02/08/2023 10:33 PM

## 2023-02-08 NOTE — Evaluation (Signed)
Clinical/Bedside Swallow Evaluation Patient Details  Name: Felicia Benson MRN: [id number] Date of Birth: [birth year]  Today's Date: 02/08/2023 Time: SLP Start Time (ACUTE ONLY): 0859 SLP Stop Time (ACUTE ONLY): 0912 SLP Time Calculation (min) (ACUTE ONLY): 13 min  Past Medical History:  Past Medical History:  Diagnosis Date   AAA (abdominal aortic aneurysm) (HCC)    CHF (congestive heart failure) (HCC)    Crohn's disease (HCC)    Diverticulitis    Diverticulosis    GERD (gastroesophageal reflux disease)    GI bleed    Hypertension    OSA (obstructive sleep apnea)    Pacemaker    Thyroid nodule    Past Surgical History:  Past Surgical History:  Procedure Laterality Date   CHOLECYSTECTOMY     COLONOSCOPY WITH PROPOFOL N/A 06/01/2015   Procedure: COLONOSCOPY WITH PROPOFOL;  Surgeon: Charlott Rakes, MD;  Location: [facility] ENDOSCOPY;  Service: Endoscopy;  Laterality: N/A;   CORONARY ANGIOPLASTY WITH STENT PLACEMENT     ESOPHAGOGASTRODUODENOSCOPY (EGD) WITH PROPOFOL N/A 06/01/2015   Procedure: ESOPHAGOGASTRODUODENOSCOPY (EGD) WITH PROPOFOL;  Surgeon: Charlott Rakes, MD;  Location: [facility] ENDOSCOPY;  Service: Endoscopy;  Laterality: N/A;   GIVENS CAPSULE STUDY N/A 06/03/2015   Procedure: GIVENS CAPSULE STUDY;  Surgeon: Charlott Rakes, MD;  Location: [facility] ENDOSCOPY;  Service: Endoscopy;  Laterality: N/A;   INTRAMEDULLARY (IM) NAIL INTERTROCHANTERIC Right 10/30/2022   Procedure: INTRAMEDULLARY nailing of right femur;  Surgeon: Joen Laura, MD;  Location: [facility] OR;  Service: Orthopedics;  Laterality: Right;   PACEMAKER PLACEMENT  2015   HPI:  Felicia Benson is a 89 y.o. female with medical history significant of osteoarthritis, Crohn's disease, GERD, hypertension, abdominal aortic aneurysm . Displaced right intertrochanteric femur fracture after a mechanical fall -underwent ORIF by orthopedics on 10/30/2022.subsequent re-hospitalisation and discharged on January 03, 2023 after she was  admitted for complaint of rectal bleed suspected to be diverticular in nature requiring several units of blood transfusion patient tells me 3.  This was managed medically CT angiography did not show active bleed.  Although diverticulosis was seen. Patient was at rehab facility.  Has been complaining of cough with some scant sputum production as well as sensation of shortness of breath, fatigue, poor appetite for the last 7 days or so.  Patient was subsequently noted to have sats of 90% by the facility on her chronic 4 L/min of supplementary oxygen.  ER workup is done with CT angiography.  Small segmental pulmonary artery filling defect in the right lower lobe.  Interstitial and groundglass opacities in the lungs bilaterally possible edema or infiltrate.  Patchy airspace disease in the lower lobes bilaterally possible atelectasis or interval infiltrate.  Mild bilateral pleural effusions.    Assessment / Plan / Recommendation  Clinical Impression  Patient presents with what appears to be normal oropharyngeal swallowing function based on bedside exam. Oral transit efficient and complete. Patient without overt s/s of aspiration. She does have a baseline congested cough which continues minimally during po intake but did not appear related to swallowing function. H/o GERD which is managed by medications per patient report. At this time, patient may continue with a regular diet, thin liquid. In order to evaluate for silent aspiration, MBS would be needed. MD, if feel necessary based on cxray results and admitting diagnosis, please order. Otherwise, patient does not need SLP f/u. Have messaged MD via secure chat for guidance on MBS. SLP Visit Diagnosis: Dysphagia, unspecified (R13.10)       Diet Recommendation Regular;Thin  liquid    Liquid Administration via: Cup;Straw Medication Administration: Whole meds with liquid Supervision: Patient able to self feed Compensations: Slow rate;Small sips/bites Postural  Changes: Seated upright at 90 degrees;Remain upright for at least 30 minutes after po intake    Other  Recommendations Oral Care Recommendations: Oral care BID    Recommendations for follow up therapy are one component of a multi-disciplinary discharge planning process, led by the attending physician.  Recommendations may be updated based on patient status, additional functional criteria and insurance authorization.  Follow up Recommendations No SLP follow up         Functional Status Assessment Patient has not had a recent decline in their functional status    Swallow Study   General HPI: Felicia Benson is a 89 y.o. female with medical history significant of osteoarthritis, Crohn's disease, GERD, hypertension, abdominal aortic aneurysm . Displaced right intertrochanteric femur fracture after a mechanical fall -underwent ORIF by orthopedics on 10/30/2022.subsequent re-hospitalisation and discharged on January 03, 2023 after she was admitted for complaint of rectal bleed suspected to be diverticular in nature requiring several units of blood transfusion patient tells me 3.  This was managed medically CT angiography did not show active bleed.  Although diverticulosis was seen. Patient was at rehab facility.  Has been complaining of cough with some scant sputum production as well as sensation of shortness of breath, fatigue, poor appetite for the last 7 days or so.  Patient was subsequently noted to have sats of 90% by the facility on her chronic 4 L/min of supplementary oxygen.  ER workup is done with CT angiography.  Small segmental pulmonary artery filling defect in the right lower lobe.  Interstitial and groundglass opacities in the lungs bilaterally possible edema or infiltrate.  Patchy airspace disease in the lower lobes bilaterally possible atelectasis or interval infiltrate.  Mild bilateral pleural effusions. Type of Study: Bedside Swallow Evaluation Previous Swallow Assessment: none in  chart Diet Prior to this Study: Regular;Thin liquids (Level 0) Temperature Spikes Noted: No Respiratory Status: Nasal cannula History of Recent Intubation: No Behavior/Cognition: Alert;Cooperative;Pleasant mood Oral Cavity Assessment: Within Functional Limits Oral Care Completed by SLP: No Oral Cavity - Dentition: Dentures, bottom;Dentures, top Vision: Functional for self-feeding Self-Feeding Abilities: Able to feed self Patient Positioning: Upright in bed Baseline Vocal Quality: Normal Volitional Cough: Strong;Congested Volitional Swallow: Able to elicit    Oral/Motor/Sensory Function Overall Oral Motor/Sensory Function: Within functional limits   Ice Chips Ice chips: Not tested   Thin Liquid Thin Liquid: Within functional limits Presentation: Cup;Self Fed;Straw    Nectar Thick Nectar Thick Liquid: Not tested   Honey Thick Honey Thick Liquid: Not tested   Puree Puree: Within functional limits Presentation: Spoon;Self Fed   Solid     Solid: Within functional limits Presentation: Self Fed     [employer] MA, CCC-SLP  Odile Veloso Meryl 02/08/2023,9:16 AM

## 2023-02-08 NOTE — Progress Notes (Signed)
Venous duplex lower ext  has been completed. Refer to [facility] under chart review to view preliminary results.   02/08/2023  11:31 AM Kyleigha Markert, Gerarda Gunther

## 2023-02-08 NOTE — Progress Notes (Signed)
PHARMACY - ANTICOAGULATION CONSULT NOTE  Pharmacy Consult for heparin Indication: pulmonary embolus and DVT  Allergies  Allergen Reactions   Motrin [Ibuprofen] Other (See Comments)    GI bleed   Ace Inhibitors Cough   Breztri Aerosphere [Budeson-Glycopyrrol-Formoterol] Hypertension   Cipro [Ciprofloxacin Hcl] Other (See Comments)    Avoid fluoroquinolones due to asc-aortic aneurysm   Levaquin [Levofloxacin] Other (See Comments)    Avoid fluoroquinolones due to asc-aortic aneurysm   Nsaids Nausea And Vomiting and Other (See Comments)    Hx of GI bleed    Patient Measurements: Height: 5\' 6"  (167.6 cm) Weight: 85.3 kg (188 lb 0.8 oz) IBW/kg (Calculated) : 59.3 Heparin Dosing Weight: 77.5kg  Vital Signs: Temp: 97.6 F (36.4 C) (01/31 1216) Temp Source: Oral (01/31 1216) BP: 93/61 (01/31 1216) Pulse Rate: 105 (01/31 1216)  Labs: Recent Labs    02/07/23 1635 02/07/23 1645 02/07/23 1645 02/07/23 2155 02/08/23 0509  HGB  --  9.2*   < > 8.3* 8.5*  HCT  --  27.0*  --  28.0* 29.0*  PLT  --   --   --  293 280  APTT  --   --   --   --  30  LABPROT  --   --   --   --  16.1*  INR  --   --   --   --  1.3*  CREATININE 1.04*  --   --   --  1.09*  TROPONINIHS  --   --   --  23* 23*   < > = values in this interval not displayed.    Estimated Creatinine Clearance: 37.7 mL/min (A) (by C-G formula based on SCr of 1.09 mg/dL (H)).   Medical History: Past Medical History:  Diagnosis Date   AAA (abdominal aortic aneurysm) (HCC)    CHF (congestive heart failure) (HCC)    Crohn's disease (HCC)    Diverticulitis    Diverticulosis    GERD (gastroesophageal reflux disease)    GI bleed    Hypertension    OSA (obstructive sleep apnea)    Pacemaker    Thyroid nodule     Medications:  Scheduled:   aspirin EC  81 mg Oral Daily   atorvastatin  40 mg Oral QHS   benzonatate  100 mg Oral TID   furosemide  40 mg Oral Daily   guaiFENesin  600 mg Oral BID   mesalamine  1,000 mg Oral  BID   metoprolol succinate  25 mg Oral Daily   mometasone-formoterol  2 puff Inhalation BID   montelukast  10 mg Oral Daily   pantoprazole  40 mg Oral BID   sodium chloride flush  3 mL Intravenous Q12H    Assessment: 89 yo female presenting with shortness of breath found to have tiny subsegmental PE, also found to have DVT in L calf. Noted recent diverticular rectal bleeding 12/2022. PCCM consulted - trial anticoag but low threshold for IVC filter if bleeding occurs. Enox prophylaxis dosing given 1/31 @ 0827 but otherwise no AC.  Goal of Therapy:  Heparin level 0.3-0.7 units/ml Monitor platelets by anticoagulation protocol: Yes   Plan:  Heparin 2000 unit bolus (smaller bolus given recent bleeding) Heparin drip 1250 units/hr Check heparin level in 8 hours Daily heparin level, CBC Monitor closely for s/sx bleeding  Rexford Maus, PharmD, BCPS 02/08/2023 12:44 PM

## 2023-02-08 NOTE — Plan of Care (Signed)
  Problem: Education: Goal: Knowledge of General Education information will improve Description: Including pain rating scale, medication(s)/side effects and non-pharmacologic comfort measures Outcome: Progressing   Problem: Clinical Measurements: Goal: Ability to maintain clinical measurements within normal limits will improve Outcome: Progressing   Problem: Clinical Measurements: Goal: Will remain free from infection Outcome: Progressing   Problem: Clinical Measurements: Goal: Respiratory complications will improve Outcome: Progressing   Problem: Activity: Goal: Risk for activity intolerance will decrease Outcome: Progressing

## 2023-02-09 DIAGNOSIS — J189 Pneumonia, unspecified organism: Secondary | ICD-10-CM | POA: Diagnosis not present

## 2023-02-09 DIAGNOSIS — J9601 Acute respiratory failure with hypoxia: Secondary | ICD-10-CM | POA: Diagnosis not present

## 2023-02-09 DIAGNOSIS — I2699 Other pulmonary embolism without acute cor pulmonale: Secondary | ICD-10-CM | POA: Diagnosis not present

## 2023-02-09 LAB — BASIC METABOLIC PANEL
Anion gap: 9 (ref 5–15)
BUN: 26 mg/dL — ABNORMAL HIGH (ref 8–23)
CO2: 29 mmol/L (ref 22–32)
Calcium: 7.6 mg/dL — ABNORMAL LOW (ref 8.9–10.3)
Chloride: 101 mmol/L (ref 98–111)
Creatinine, Ser: 1.17 mg/dL — ABNORMAL HIGH (ref 0.44–1.00)
GFR, Estimated: 44 mL/min — ABNORMAL LOW (ref >60)
Glucose, Bld: 89 mg/dL (ref 70–99)
Potassium: 4 mmol/L (ref 3.5–5.1)
Sodium: 139 mmol/L (ref 135–145)

## 2023-02-09 LAB — HEPARIN LEVEL (UNFRACTIONATED)
Heparin Unfractionated: 0.81 [IU]/mL — ABNORMAL HIGH (ref 0.30–0.70)
Heparin Unfractionated: 0.65 [IU]/mL (ref 0.30–0.70)

## 2023-02-09 LAB — CBC
HCT: 26.9 % — ABNORMAL LOW (ref 36.0–46.0)
Hemoglobin: 8.2 g/dL — ABNORMAL LOW (ref 12.0–15.0)
MCH: 29.1 pg (ref 26.0–34.0)
MCHC: 30.5 g/dL (ref 30.0–36.0)
MCV: 95.4 fL (ref 80.0–100.0)
Platelets: 286 10*3/uL (ref 150–400)
RBC: 2.82 MIL/uL — ABNORMAL LOW (ref 3.87–5.11)
RDW: 18.2 % — ABNORMAL HIGH (ref 11.5–15.5)
WBC: 5.1 10*3/uL (ref 4.0–10.5)
nRBC: 0 % (ref 0.0–0.2)

## 2023-02-09 MED ORDER — HYDROCODONE BIT-HOMATROP MBR 5-1.5 MG/5ML PO SOLN
5.0000 mL | Freq: Four times a day (QID) | ORAL | Status: DC | PRN
  Administered 2023-02-10 – 2023-02-18 (×16): 5 mL via ORAL
  Filled 2023-02-09 (×16): qty 5

## 2023-02-09 MED ORDER — DOCUSATE SODIUM 100 MG PO CAPS
100.0000 mg | ORAL_CAPSULE | Freq: Two times a day (BID) | ORAL | Status: DC
  Administered 2023-02-09 – 2023-02-10 (×2): 100 mg via ORAL
  Filled 2023-02-09 (×3): qty 1

## 2023-02-09 MED ORDER — BENZONATATE 100 MG PO CAPS
200.0000 mg | ORAL_CAPSULE | Freq: Three times a day (TID) | ORAL | Status: DC
  Administered 2023-02-09 – 2023-02-19 (×29): 200 mg via ORAL
  Filled 2023-02-09 (×29): qty 2

## 2023-02-09 MED ORDER — DEXTROMETHORPHAN POLISTIREX ER 30 MG/5ML PO SUER
15.0000 mg | Freq: Two times a day (BID) | ORAL | Status: DC
  Administered 2023-02-09 – 2023-02-19 (×22): 15 mg via ORAL
  Filled 2023-02-09 (×22): qty 5

## 2023-02-09 NOTE — Progress Notes (Signed)
   02/09/23 0130  Provider Notification  Provider Name/Title Dr. Janalyn Shy  Date Provider Notified 02/09/23  Time Provider Notified 0130  Method of Notification Page  Notification Reason Change in status (Significant coughing)  Provider response See new orders  Date of Provider Response 02/09/23   Patient continues to cough frequently even after Mucinex and Tessalon at bedtime.  Admission standing orders contains guaifenesin.  Pharmacist recommended dextromethorphan.  Dr. Janalyn Shy made aware.  Order received and implemented.  Bernie Covey RN

## 2023-02-09 NOTE — Progress Notes (Signed)
[facility]                                                                              Felicia Benson, is a 89 y.o. female, DOB - May 06, 1930, ZOX:[id number] Admit date - 02/07/2023    Outpatient Primary MD for the patient is Felicia Ates, MD  LOS - 2  days  Chief Complaint  Patient presents with   Pneumonia       Brief summary   Patient is a 89 year old female with osteoarthritis, Crohn's disease, GERD, HTN, AAA, admitted in 10/2022 with right hip fracture after mechanical fall and underwent ORIF, hospitalization in 12/2022 for rectal bleeding suspected to be diverticular bleeding 3 RBC transfusions.  CT angiography did not show active bleed.  Patient presented from SNF with cough, shortness of breath, fatigue, poor appetite for a week.  She was noted to have O2 sats of 90% by the facility on her chronic 4 L O2.  Denied any fevers, leg swelling, chest pain or palpitations. CTA chest showed small segmental pulmonary artery filling defect in the right lower lobe, interstitial and groundglass opacities in the lungs bilaterally, possible edema or infiltrate.  Patchy airspace disease in the lower lobes bilaterally.  Mild bilateral pleural effusions.   Assessment & Plan    Principal Problem:   CAP (community acquired pneumonia), multifocal Acute respiratory failure with hypoxia -Flu, COVID, RSV negative -Mild elevated troponin 23-23, likely demand ischemia -Continue IV Unasyn, Zithromax -MBS done, mild age-related oropharyngeal dysphagia -Continue Tessalon Perles, Mucinex, Hycodan for cough -Incentive spirometry  Volume overload with bilateral pleural effusions, mild acute on chronic diastolic CHF Right ventricular dysfunction (new from previous echo 07/23/2022) -2D echo 07/2022 had shown EF 60 to 65%, G1 DD -BNP 829.2, CTA with bilateral pleural effusions -Received extra Lasix 20 mg IV x 1 on 1/31, currently on Lasix 40 mg p.o. daily -2D echo showed EF of 50 to 55%,  septal hypokinesis, indeterminate diastolic filling.  D-shaped septum suggestive of RV pressure/volume overload, moderately reduced right ventricular systolic function.  Right ventricular size moderately large, moderately elevated PA systolic pressure. -Right ventricular dysfunction appears to be new from previous echo on 07/23/2022 -Cardiology consulted.    Acute pulmonary embolism (HCC), subsegmental PE. -CTA chest showed small subsegmental pulmonary artery filling defect of the right lower lobe.  Right ventricular distended, may reflect right heart strain, interstitial and groundglass opacities in the lungs bilaterally possible edema or infiltrate.  Patchy airspace disease in the lower lobes. -I discussed with radiology, Dr. Llana Aliment who reviewed the CTA and confirmed that patient indeed has small subsegmental PE.  Appreciate pulmonology recommendations -  Venous Doppler showed acute DVT involving the left peroneal veins -Given acute DVT, subsegmental PE, continue IV heparin drip, follow closely for any bleeding issues, worsening anemia.  In that case, will place IVC filter.  Patient is alert and oriented, agreeable with the plan.    History of Crohn's disease, recent diverticular bleed in 12/2022, GERD -Continue Protonix -Not in flare, continue Pentasa   CAD status post PCI, HLP -Currently no acute chest pain, continue aspirin, statin, metoprolol  Chronic kidney disease stage III  a -Creatinine currently at baseline   Obesity class II Estimated body mass index is 30.35 kg/m as calculated from the following:   Height as of this encounter: 5\' 6"  (1.676 m).   Weight as of this encounter: 85.3 kg.  Code Status: DNR DVT Prophylaxis:  SCDs Start: 02/07/23 2215   Level of Care: Level of care: Telemetry Medical Family Communication: Updated patient.   Disposition Plan:      Remains inpatient appropriate:      Procedures:    Consultants:   Pulmonology  Antimicrobials:    Anti-infectives (From admission, onward)    Start     Dose/Rate Route Frequency Ordered Stop   02/08/23 1000  azithromycin (ZITHROMAX) 500 mg in sodium chloride 0.9 % 250 mL IVPB        500 mg 250 mL/hr over 60 Minutes Intravenous Every 24 hours 02/07/23 2158 02/09/23 1054   02/07/23 2300  Ampicillin-Sulbactam (UNASYN) 3 g in sodium chloride 0.9 % 100 mL IVPB        3 g 200 mL/hr over 30 Minutes Intravenous Every 6 hours 02/07/23 2229     02/07/23 1600  cefTRIAXone (ROCEPHIN) 1 g in sodium chloride 0.9 % 100 mL IVPB  Status:  Discontinued        1 g 200 mL/hr over 30 Minutes Intravenous  Once 02/07/23 1548 02/07/23 2229   02/07/23 1600  azithromycin (ZITHROMAX) 500 mg in sodium chloride 0.9 % 250 mL IVPB        500 mg 250 mL/hr over 60 Minutes Intravenous  Once 02/07/23 1548 02/07/23 2047          Medications  aspirin EC  81 mg Oral Daily   atorvastatin  40 mg Oral QHS   benzonatate  100 mg Oral TID   dextromethorphan  15 mg Oral BID   feeding supplement  237 mL Oral BID BM   furosemide  40 mg Oral Daily   guaiFENesin  600 mg Oral BID   melatonin  3 mg Oral QHS   mesalamine  1,000 mg Oral BID   metoprolol succinate  25 mg Oral Daily   mometasone-formoterol  2 puff Inhalation BID   montelukast  10 mg Oral Daily   pantoprazole  40 mg Oral BID   sodium chloride flush  3 mL Intravenous Q12H      Subjective:   [name] was seen and examined today.  Alert and oriented, no acute complaints except coughing.  No fevers or chills, chest pain.  O2 sats 95% on 3 L.  Objective:   Vitals:   02/08/23 2127 02/09/23 0600 02/09/23 0913 02/09/23 0945  BP: 92/65 97/66  116/67  Pulse: 99 92  87  Resp: 18 18  18   Temp: 97.7 F (36.5 C) 98.2 F (36.8 C)  98.4 F (36.9 C)  TempSrc: Oral Oral  Oral  SpO2: 93% 94% 94% 95%  Weight:      Height:        Intake/Output Summary (Last 24 hours) at 02/09/2023 1257 Last data filed at 02/09/2023 0600 Gross per 24 hour  Intake  988.96 ml  Output 400 ml  Net 588.96 ml     Wt Readings from Last 3 Encounters:  02/07/23 85.3 kg  12/28/22 85.3 kg  11/13/22 97.6 kg   Physical Exam General: Alert and oriented x 3, NAD, ill-appearing Cardiovascular: S1 S2 clear, RRR.  Respiratory: Bilateral scattered rhonchi Gastrointestinal: Soft, nontender, nondistended, NBS Ext: no pedal edema bilaterally Neuro: no new  deficits Psych: Normal affect       Data Reviewed:  I have personally reviewed following labs    CBC Lab Results  Component Value Date   WBC 5.1 02/09/2023   RBC 2.82 (L) 02/09/2023   HGB 8.2 (L) 02/09/2023   HCT 26.9 (L) 02/09/2023   MCV 95.4 02/09/2023   MCH 29.1 02/09/2023   PLT 286 02/09/2023   MCHC 30.5 02/09/2023   RDW 18.2 (H) 02/09/2023   LYMPHSABS 1.7 12/30/2022   MONOABS 1.2 (H) 12/30/2022   EOSABS 0.2 12/30/2022   BASOSABS 0.0 12/30/2022     Last metabolic panel Lab Results  Component Value Date   NA 139 02/09/2023   K 4.0 02/09/2023   CL 101 02/09/2023   CO2 29 02/09/2023   BUN 26 (H) 02/09/2023   CREATININE 1.17 (H) 02/09/2023   GLUCOSE 89 02/09/2023   GFRNONAA 44 (L) 02/09/2023   GFRAA 44 (L) 05/21/2019   CALCIUM 7.6 (L) 02/09/2023   PHOS 3.2 05/21/2019   PROT 5.3 (L) 02/07/2023   ALBUMIN 2.1 (L) 02/07/2023   BILITOT 0.5 02/07/2023   ALKPHOS 70 02/07/2023   AST 18 02/07/2023   ALT 8 02/07/2023   ANIONGAP 9 02/09/2023    CBG (last 3)  No results for input(s): "GLUCAP" in the last 72 hours.    Coagulation Profile: Recent Labs  Lab 02/08/23 0509  INR 1.3*     Radiology Studies: I have personally reviewed the imaging studies  ECHOCARDIOGRAM COMPLETE Result Date: 02/08/2023    ECHOCARDIOGRAM REPORT   Patient Name:   SHANIYAH WIX Date of Exam: 02/08/2023 Medical Rec #:  [id number]        Height:       66.0 in Accession #:    [id number]       Weight:       188.1 lb Date of Birth:  [birth year]         BSA:          1.948 m Patient Age:    89 years         BP:            130/67 mmHg Patient Gender: F                HR:           94 bpm. Exam Location:  Inpatient Procedure: 2D Echo, Color Doppler and Cardiac Doppler Indications:    pulmonary emboli  History:        Patient has prior history of Echocardiogram examinations, most                 recent 07/23/2022. CHF, CAD; Risk Factors:Hypertension and                 Dyslipidemia.  Sonographer:    Melissa Morford RDCS (AE, PE) Referring Phys: [id number] Tylie Golonka K Maxtyn Nuzum IMPRESSIONS  1. Left ventricular ejection fraction, by estimation, is 50 to 55%. The left ventricle has low normal function. The left ventricle demonstrates regional wall motion abnormalities with septal hypokinesis. There is mild concentric left ventricular hypertrophy. Indeterminate diastolic filling due to E-A fusion.  2. D-shaped septum suggestive of RV pressure/volume overload. Right ventricular systolic function is moderately reduced. The right ventricular size is moderately enlarged. There is moderately elevated pulmonary artery systolic pressure. The estimated right ventricular systolic pressure is 56.2 mmHg.  3. Left atrial size was mildly dilated.  4. Right atrial size was moderately dilated.  5. The mitral  valve is normal in structure. No evidence of mitral valve regurgitation. No evidence of mitral stenosis.  6. The aortic valve is tricuspid. There is mild calcification of the aortic valve. Aortic valve regurgitation is not visualized. No aortic stenosis is present.  7. Aortic dilatation noted. There is mild dilatation of the aortic root, measuring 38 mm. There is mild dilatation of the ascending aorta, measuring 43 mm.  8. The inferior vena cava is normal in size with <50% respiratory variability, suggesting right atrial pressure of 8 mmHg. FINDINGS  Left Ventricle: Left ventricular ejection fraction, by estimation, is 50 to 55%. The left ventricle has low normal function. The left ventricle demonstrates regional wall motion abnormalities. The left  ventricular internal cavity size was normal in size. There is mild concentric left ventricular hypertrophy. Indeterminate diastolic filling due to E-A fusion. Right Ventricle: D-shaped septum suggestive of RV pressure/volume overload. The right ventricular size is moderately enlarged. No increase in right ventricular wall thickness. Right ventricular systolic function is moderately reduced. There is moderately  elevated pulmonary artery systolic pressure. The tricuspid regurgitant velocity is 3.47 m/s, and with an assumed right atrial pressure of 8 mmHg, the estimated right ventricular systolic pressure is 56.2 mmHg. Left Atrium: Left atrial size was mildly dilated. Right Atrium: Right atrial size was moderately dilated. Pericardium: There is no evidence of pericardial effusion. Mitral Valve: The mitral valve is normal in structure. There is mild calcification of the mitral valve leaflet(s). Mild mitral annular calcification. No evidence of mitral valve regurgitation. No evidence of mitral valve stenosis. Tricuspid Valve: The tricuspid valve is normal in structure. Tricuspid valve regurgitation is mild. Aortic Valve: The aortic valve is tricuspid. There is mild calcification of the aortic valve. Aortic valve regurgitation is not visualized. No aortic stenosis is present. Pulmonic Valve: The pulmonic valve was normal in structure. Pulmonic valve regurgitation is mild to moderate. Aorta: Aortic dilatation noted. There is mild dilatation of the aortic root, measuring 38 mm. There is mild dilatation of the ascending aorta, measuring 43 mm. Venous: The inferior vena cava is normal in size with less than 50% respiratory variability, suggesting right atrial pressure of 8 mmHg. IAS/Shunts: No atrial level shunt detected by color flow Doppler. Additional Comments: A device lead is visualized in the right ventricle.  LEFT VENTRICLE PLAX 2D LVIDd:         4.00 cm LVIDs:         3.30 cm LV PW:         1.10 cm LV IVS:         1.20 cm  RIGHT VENTRICLE RV S prime:     7.72 cm/s TAPSE (M-mode): 2.1 cm LEFT ATRIUM             Index        RIGHT ATRIUM           Index LA diam:        3.70 cm 1.90 cm/m   RA Area:     24.70 cm LA Vol (A2C):   43.4 ml 22.28 ml/m  RA Volume:   80.60 ml  41.37 ml/m LA Vol (A4C):   62.3 ml 31.98 ml/m LA Biplane Vol: 54.6 ml 28.03 ml/m  AORTIC VALVE LVOT Vmax:   106.00 cm/s LVOT Vmean:  76.400 cm/s LVOT VTI:    0.160 m  AORTA Ao Root diam: 3.80 cm Ao Asc diam:  4.30 cm TRICUSPID VALVE TR Peak grad:   48.2 mmHg TR Vmax:  347.00 cm/s  SHUNTS Systemic VTI: 0.16 m Dalton McleanMD Electronically signed by Wilfred Lacy Signature Date/Time: 02/08/2023/12:56:56 PM    Final    VAS [place] LOWER EXTREMITY VENOUS (DVT) Result Date: 02/08/2023  Lower Venous DVT Study Patient Name:  ERENDIDA WRENN  Date of Exam:   02/08/2023 Medical Rec #: [id number]         Accession #:    [id number] Date of Birth: [birth year]          Patient Gender: F Patient Age:   22 years Exam Location:  [facility] Procedure:      VAS [place] LOWER EXTREMITY VENOUS (DVT) Referring Phys: [facility] GOEL --------------------------------------------------------------------------------  Indications: Swelling, and Pain. Other Indications: Right femur fracture after a mechanical fall. Performing Technologist: Marilynne Halsted RDMS, RVT  Examination Guidelines: A complete evaluation includes B-mode imaging, spectral Doppler, color Doppler, and power Doppler as needed of all accessible portions of each vessel. Bilateral testing is considered an integral part of a complete examination. Limited examinations for reoccurring indications may be performed as noted. The reflux portion of the exam is performed with the patient in reverse Trendelenburg.  +---------+---------------+---------+-----------+----------+--------------+ RIGHT    CompressibilityPhasicitySpontaneityPropertiesThrombus Aging  +---------+---------------+---------+-----------+----------+--------------+ CFV      Full           Yes      Yes                                 +---------+---------------+---------+-----------+----------+--------------+ SFJ      Full                                                        +---------+---------------+---------+-----------+----------+--------------+ FV Prox  Full                                                        +---------+---------------+---------+-----------+----------+--------------+ FV Mid   Full                                                        +---------+---------------+---------+-----------+----------+--------------+ FV DistalFull                                                        +---------+---------------+---------+-----------+----------+--------------+ PFV      Full                                                        +---------+---------------+---------+-----------+----------+--------------+ POP      Full           Yes      Yes                                 +---------+---------------+---------+-----------+----------+--------------+  PTV      Full                                                        +---------+---------------+---------+-----------+----------+--------------+ PERO     Full                                                        +---------+---------------+---------+-----------+----------+--------------+   +---------+---------------+---------+-----------+----------+--------------+ LEFT     CompressibilityPhasicitySpontaneityPropertiesThrombus Aging +---------+---------------+---------+-----------+----------+--------------+ CFV      Full           Yes      Yes                                 +---------+---------------+---------+-----------+----------+--------------+ SFJ      Full                                                         +---------+---------------+---------+-----------+----------+--------------+ FV Prox  Full                                                        +---------+---------------+---------+-----------+----------+--------------+ FV Mid   Full                                                        +---------+---------------+---------+-----------+----------+--------------+ FV DistalFull                                                        +---------+---------------+---------+-----------+----------+--------------+ PFV      Full                                                        +---------+---------------+---------+-----------+----------+--------------+ POP      Full           Yes      Yes                                 +---------+---------------+---------+-----------+----------+--------------+ PTV      Full                                                        +---------+---------------+---------+-----------+----------+--------------+  PERO     Partial                                      Acute          +---------+---------------+---------+-----------+----------+--------------+ Acute DVT in one of the paired peroneal veins.   Summary: RIGHT: - There is no evidence of deep vein thrombosis in the lower extremity.  - No cystic structure found in the popliteal fossa.  LEFT: - Findings consistent with acute deep vein thrombosis involving the left peroneal veins.  - No cystic structure found in the popliteal fossa.  *See table(s) above for measurements and observations.    Preliminary    DG Swallowing Func-Speech Pathology Result Date: 02/08/2023 Table formatting from the original result was not included. Modified Barium Swallow Study Patient Details Name: MEYLI BOICE MRN: [id number] Date of Birth: 04-23-1930 Today's Date: 02/08/2023 HPI/PMH: HPI: KRISTAN BRUMMITT is a 89 y.o. female with medical history significant of osteoarthritis, Crohn's disease, GERD, hypertension,  abdominal aortic aneurysm . Displaced right intertrochanteric femur fracture after a mechanical fall -underwent ORIF by orthopedics on 10/30/2022.subsequent re-hospitalisation and discharged on January 03, 2023 after she was admitted for complaint of rectal bleed suspected to be diverticular in nature requiring several units of blood transfusion patient tells me 3.  This was managed medically CT angiography did not show active bleed.  Although diverticulosis was seen. Patient was at rehab facility.  Has been complaining of cough with some scant sputum production as well as sensation of shortness of breath, fatigue, poor appetite for the last 7 days or so.  Patient was subsequently noted to have sats of 90% by the facility on her chronic 4 L/min of supplementary oxygen.  ER workup is done with CT angiography.  Small segmental pulmonary artery filling defect in the right lower lobe.  Interstitial and groundglass opacities in the lungs bilaterally possible edema or infiltrate.  Patchy airspace disease in the lower lobes bilaterally possible atelectasis or interval infiltrate.  Mild bilateral pleural effusions. Clinical Impression: Clinical Impression: Patient presents with a mild, largely age related oropharyngeal dysphagia characterized by mildly reduced base of tongue and laryngeal weakness and age appropriate initiation of the swallow at the level of the vallecula and pyriform sinuses with intermittent trace penetration of thin liquids. Even when challenged with multiple consecutive large boluses, penetrates were minimal and remained above the vocal cords, clearing with subsequent swallows and cued throat clear. Do not feel that this degree of deficit would account for multifocal PNA. Educated patient on benefits of small sips, slow rate, and possible occassional throat clear to decrease aspiration risks. Patient verbalized understanding. No further SLP needs indicated. Factors that may increase risk of adverse  event in presence of aspiration Rubye Oaks & Clearance Coots 2021): No data recorded Recommendations/Plan: Swallowing Evaluation Recommendations Swallowing Evaluation Recommendations Recommendations: PO diet PO Diet Recommendation: Regular; Thin liquids (Level 0) Liquid Administration via: Cup; Straw Medication Administration: Whole meds with liquid Supervision: Patient able to self-feed Swallowing strategies  : Slow rate; Small bites/sips; Clear throat intermittently Postural changes: Position pt fully upright for meals Oral care recommendations: Oral care BID (2x/day) Treatment Plan Treatment Plan Treatment recommendations: No treatment recommended at this time Follow-up recommendations: No SLP follow up Functional status assessment: Patient has not had a recent decline in their functional status. Recommendations Recommendations for follow up therapy are one component of a multi-disciplinary discharge planning process, led  by the attending physician.  Recommendations may be updated based on patient status, additional functional criteria and insurance authorization. Assessment: Orofacial Exam: Orofacial Exam Oral Cavity: Oral Hygiene: WFL Oral Cavity - Dentition: Dentures, bottom; Dentures, top Orofacial Anatomy: WFL Oral Motor/Sensory Function: WFL Anatomy: Anatomy: Presence of cervical hardware Boluses Administered: Boluses Administered Boluses Administered: Thin liquids (Level 0); Puree; Solid  Oral Impairment Domain: Oral Impairment Domain Lip Closure: No labial escape Tongue control during bolus hold: Cohesive bolus between tongue to palatal seal Bolus preparation/mastication: Timely and efficient chewing and mashing Bolus transport/lingual motion: Brisk tongue motion Oral residue: Trace residue lining oral structures Location of oral residue : Tongue; Floor of mouth Initiation of pharyngeal swallow : Pyriform sinuses  Pharyngeal Impairment Domain: Pharyngeal Impairment Domain Soft palate elevation: No bolus between soft  palate (SP)/pharyngeal wall (PW) Laryngeal elevation: Partial superior movement of thyroid cartilage/partial approximation of arytenoids to epiglottic petiole Anterior hyoid excursion: Partial anterior movement Epiglottic movement: Complete inversion Laryngeal vestibule closure: Incomplete, narrow column air/contrast in laryngeal vestibule Pharyngeal stripping wave : Present - complete Pharyngeal contraction (A/P view only): N/A Pharyngoesophageal segment opening: Complete distension and complete duration, no obstruction of flow Tongue base retraction: Trace column of contrast or air between tongue base and PPW Pharyngeal residue: Collection of residue within or on pharyngeal structures Location of pharyngeal residue: Valleculae; Pharyngeal wall; Pyriform sinuses; Diffuse (>3 areas)  Esophageal Impairment Domain: Esophageal Impairment Domain Esophageal clearance upright position: Complete clearance, esophageal coating Pill: Pill Consistency administered: Thin liquids (Level 0) Thin liquids (Level 0): Impaired (see clinical impressions) Penetration/Aspiration Scale Score: Penetration/Aspiration Scale Score 1.  Material does not enter airway: Puree; Solid; Pill 3.  Material enters airway, remains ABOVE vocal cords and not ejected out: Thin liquids (Level 0) Compensatory Strategies: Compensatory Strategies Compensatory strategies: No   General Information: Caregiver present: No  Diet Prior to this Study: Regular; Thin liquids (Level 0)   Temperature : Normal   Respiratory Status: WFL   Supplemental O2: Nasal cannula   History of Recent Intubation: No  Behavior/Cognition: Alert; Cooperative; Pleasant mood Self-Feeding Abilities: Able to self-feed Baseline vocal quality/speech: Normal Volitional Cough: Able to elicit Volitional Swallow: Able to elicit No data recorded Goal Planning: No data recorded No data recorded No data recorded No data recorded Consulted and agree with results and recommendations: Patient Pain: Pain  Assessment Pain Assessment: No/denies pain End of Session: Start Time:SLP Start Time (ACUTE ONLY): 1030 Stop Time: SLP Stop Time (ACUTE ONLY): 1055 Time Calculation:SLP Time Calculation (min) (ACUTE ONLY): 25 min Charges: SLP Evaluations $ SLP Speech Visit: 1 Visit SLP Evaluations $BSS Swallow: 1 Procedure $MBS Swallow: 1 Procedure SLP visit diagnosis: SLP Visit Diagnosis: Dysphagia, oropharyngeal phase (R13.12) Past Medical History: Past Medical History: Diagnosis Date  AAA (abdominal aortic aneurysm) (HCC)   CHF (congestive heart failure) (HCC)   Crohn's disease (HCC)   Diverticulitis   Diverticulosis   GERD (gastroesophageal reflux disease)   GI bleed   Hypertension   OSA (obstructive sleep apnea)   Pacemaker   Thyroid nodule  Past Surgical History: Past Surgical History: Procedure Laterality Date  CHOLECYSTECTOMY    COLONOSCOPY WITH PROPOFOL N/A 06/01/2015  Procedure: COLONOSCOPY WITH PROPOFOL;  Surgeon: Charlott Rakes, MD;  Location: [facility] ENDOSCOPY;  Service: Endoscopy;  Laterality: N/A;  CORONARY ANGIOPLASTY WITH STENT PLACEMENT    ESOPHAGOGASTRODUODENOSCOPY (EGD) WITH PROPOFOL N/A 06/01/2015  Procedure: ESOPHAGOGASTRODUODENOSCOPY (EGD) WITH PROPOFOL;  Surgeon: Charlott Rakes, MD;  Location: [facility] ENDOSCOPY;  Service: Endoscopy;  Laterality: N/A;  GIVENS CAPSULE  STUDY N/A 06/03/2015  Procedure: GIVENS CAPSULE STUDY;  Surgeon: Charlott Rakes, MD;  Location: [facility] ENDOSCOPY;  Service: Endoscopy;  Laterality: N/A;  INTRAMEDULLARY (IM) NAIL INTERTROCHANTERIC Right 10/30/2022  Procedure: INTRAMEDULLARY nailing of right femur;  Surgeon: Joen Laura, MD;  Location: [facility] OR;  Service: Orthopedics;  Laterality: Right;  PACEMAKER PLACEMENT  [address] [place], CCC-SLP McCoy Leah Meryl 02/08/2023, 11:18 AM  CT Angio Chest PE W and/or Wo Contrast Addendum Date: 02/07/2023 ADDENDUM REPORT: 02/07/2023 20:08 ADDENDUM: Critical Value/emergent results were called by telephone at the time of interpretation on 02/07/2023  at 8:07 pm to provider Dr. Joneen Caraway , who verbally acknowledged these results. Electronically Signed   By: Thornell Sartorius M.D.   On: 02/07/2023 20:08   Result Date: 02/07/2023 CLINICAL DATA:  Pulmonary embolism suspected, high probability. EXAM: CT ANGIOGRAPHY CHEST WITH CONTRAST TECHNIQUE: Multidetector CT imaging of the chest was performed using the standard protocol during bolus administration of intravenous contrast. Multiplanar CT image reconstructions and MIPs were obtained to evaluate the vascular anatomy. RADIATION DOSE REDUCTION: This exam was performed according to the departmental dose-optimization program which includes automated exposure control, adjustment of the mA and/or kV according to patient size and/or use of iterative reconstruction technique. CONTRAST:  75mL OMNIPAQUE IOHEXOL 350 MG/ML SOLN COMPARISON:  07/19/2022. FINDINGS: Cardiovascular: Heart is enlarged and there is no pericardial effusion. Pacemaker leads are noted in the heart. Multi-vessel coronary artery calcifications are present. There is atherosclerotic calcification of the aorta with aneurysmal dilatation of the ascending aorta measuring 4.3 cm. The pulmonary trunk is distended suggesting underlying pulmonary artery hypertension. There is a suspected pulmonary artery filling defect in a segmental pulmonary artery in the right lower lobe. The right ventricle is distended, similar in appearance to the prior exam. Mediastinum/Nodes: Enlarged lymph nodes are present in the mediastinum measuring up to 1.4 cm in the AP window. Enlarged hilar lymph nodes are present bilaterally. No axillary lymphadenopathy. The thyroid gland is enlarged with heterogeneous attenuation. Surgical clips are noted in the region of the left lobe of the thyroid gland. The trachea and esophagus are within normal limits. There is a small hiatal hernia. Lungs/Pleura: Ground-glass opacities and interstitial prominence are present in the lungs bilaterally. There is  patchy atelectasis or infiltrate in the lower lobes bilaterally. Small bilateral pleural effusions are noted, greater on the right than on the left. No pneumothorax is seen. Upper Abdomen: The gallbladder is surgically absent. Scattered diverticula are noted along the colon without diverticulitis. No acute abnormality. Musculoskeletal: A pacemaker device is noted in the left chest wall. Degenerative changes are noted in the thoracic spine. No acute osseous abnormality is seen. Review of the MIP images confirms the above findings. IMPRESSION: 1. Small segmental pulmonary artery filling defect in the right lower lobe. The right ventricle is distended, similar in appearance to prior exams which may reflect right heart failure. The possibility of superimposed right heart strain can not be excluded. 2. Interstitial and ground-glass opacities in the lungs bilaterally, possible edema or infiltrate. 3. Patchy airspace disease in the lower lobes bilaterally, possible atelectasis or infiltrate. 4. Small bilateral pleural effusions. 5. Coronary artery calcifications. 6. Distended pulmonary trunk suggesting underlying pulmonary artery hypertension. 7. Aortic atherosclerosis with aneurysmal dilatation of the ascending aorta measuring 4.3 cm. Recommend annual imaging followup by CTA or MRA. This recommendation follows 2010 ACCF/AHA/AATS/ACR/ASA/SCA/SCAI/SIR/STS/SVM Guidelines for the Diagnosis and Management of Patients with Thoracic Aortic Disease. Circulation. 2010; 121: W098-J191. Aortic aneurysm NOS (ICD10-I71.9) Electronically Signed: By: Vernona Rieger  Ladona Ridgel M.D. On: 02/07/2023 20:02   DG Chest Port 1 View Result Date: 02/07/2023 CLINICAL DATA:  Lethargy and weakness. EXAM: PORTABLE CHEST 1 VIEW COMPARISON:  Chest radiograph dated November 09, 2022. FINDINGS: Stable cardiomediastinal silhouette. Stable dual lead left-sided pacemaker in place. Aortic atherosclerosis. Streaky bibasilar opacities. Similar blunting of the left  costophrenic angle. No pneumothorax. Partially visualized postsurgical changes of the cervical spine. Surgical staples along the left inferomedial neck. No acute osseous abnormality. IMPRESSION: 1. Streaky bibasilar opacities could reflect atelectasis, edema, or infiltrate. 2. Similar blunting of the left costophrenic angle may reflect a trace pleural effusion versus overlying soft tissue. Electronically Signed   By: Hart Robinsons M.D.   On: 02/07/2023 15:23       Brennyn Ortlieb M.D. [facility] 02/09/2023, 12:57 PM  Available via Epic secure chat 7am-7pm After 7 pm, please refer to night coverage provider listed on amion.

## 2023-02-09 NOTE — Progress Notes (Signed)
   02/08/23 2001  Provider Notification  Provider Name/Title Dr. Janalyn Shy  Date Provider Notified 02/08/23  Time Provider Notified 2001  Method of Notification Page  Notification Reason Requested by patient/family  Provider response See new orders  Date of Provider Response 02/08/23  Time of Provider Response 2006   Patient is c/o mild dizziness.  She asks that MD be called for an order for meclizine and melatonin for sleep.  Dr. Janalyn Shy made aware and new orders received and implemented.  Bernie Covey RN

## 2023-02-09 NOTE — Progress Notes (Signed)
PHARMACY - ANTICOAGULATION CONSULT NOTE  Pharmacy Consult for heparin Indication: pulmonary embolus and DVT  Allergies  Allergen Reactions   Motrin [Ibuprofen] Other (See Comments)    GI bleed   Ace Inhibitors Cough   Breztri Aerosphere [Budeson-Glycopyrrol-Formoterol] Hypertension   Cipro [Ciprofloxacin Hcl] Other (See Comments)    Avoid fluoroquinolones due to asc-aortic aneurysm   Levaquin [Levofloxacin] Other (See Comments)    Avoid fluoroquinolones due to asc-aortic aneurysm   Nsaids Nausea And Vomiting and Other (See Comments)    Hx of GI bleed    Patient Measurements: Height: 5\' 6"  (167.6 cm) Weight: 85.3 kg (188 lb 0.8 oz) IBW/kg (Calculated) : 59.3 Heparin Dosing Weight: 77.5kg  Vital Signs: Temp: 98.2 F (36.8 C) (02/01 0600) Temp Source: Oral (02/01 0600) BP: 97/66 (02/01 0600) Pulse Rate: 92 (02/01 0600)  Labs: Recent Labs    02/07/23 1635 02/07/23 1645 02/07/23 2155 02/08/23 0509 02/08/23 2154 02/09/23 0416 02/09/23 0805  HGB  --    < > 8.3* 8.5*  --  8.2*  --   HCT  --    < > 28.0* 29.0*  --  26.9*  --   PLT  --   --  293 280  --  286  --   APTT  --   --   --  30  --   --   --   LABPROT  --   --   --  16.1*  --   --   --   INR  --   --   --  1.3*  --   --   --   HEPARINUNFRC  --   --   --   --  0.75*  --  0.81*  CREATININE 1.04*  --   --  1.09*  --  1.17*  --   TROPONINIHS  --   --  23* 23*  --   --   --    < > = values in this interval not displayed.    Estimated Creatinine Clearance: 35.2 mL/min (A) (by C-G formula based on SCr of 1.17 mg/dL (H)).  Assessment: 89 yo female presenting with shortness of breath found to have tiny subsegmental PE, also found to have DVT in L calf. Noted recent diverticular rectal bleeding 12/2022. PCCM consulted - trial anticoag but low threshold for IVC filter if bleeding occurs. Enox prophylaxis dosing given 1/31 @ 0827 but otherwise no AC.  Heparin level still came back supratherapeutic again. We will decrease  rate further and recheck level.  Goal of Therapy:  Heparin level 0.3-0.7 units/ml Monitor platelets by anticoagulation protocol: Yes   Plan:  Decrease heparin drip to 1000 units/hr Check heparin level in 8 hours F/u with oral [facility]  Ulyses Southward, PharmD, BCIDP, AAHIVP, CPP Infectious Disease Pharmacist 02/09/2023 9:04 AM

## 2023-02-09 NOTE — Consult Note (Addendum)
Cardiology Consultation   Patient ID: KOA PALLA MRN: [id number]; DOB: [birth year]  Admit date: 02/07/2023 Date of Consult: 02/09/2023  PCP:  [name], MD   [facility] Providers Cardiologist:  None - new Click here to update MD or APP on Care Team, Refresh:1}     Patient Profile:   Felicia Benson is a 89 y.o. female with a hx of osteoarthritis, Crohn's disease with recent GI bleed 12/2022, GERD, hypertension, abdominal aortic aneurysm, COPD, chronic respiratory failure, CKD 3A, CAD, PPM in place 07/11/2022 and hyperlipidemia who is being seen 02/09/2023 for the evaluation of diastolic heart failure at the request of Dr. Isidoro Donning.  History of Present Illness:   Ms. Alaniz previously followed with cardiology through [facility].  She had PPM generator exchange on 07/31/2022 for ERI, placed initially for complete heart block.     She has a history of COPD and is on home O2 for chronic respiratory failure.    She has a history of PCI-RCA and?  2017.  Unfortunately she was found to have ISR ("my stent clogged").  She underwent repeat stenting, unclear if this means she has 2 layers of stents in her RCA.  She had a Myoview that showed fixed apical and inferolateral wall defects.  She proceeded to heart catheterization 01/25/2020 showed no significant CAD, no PCI.  She had mildly elevated LVEDP of 14 mmHg.  Recurrent angina felt likely due to coronary microvascular disease.  She was treated with Ranexa 500 mg twice daily in addition to long-acting nitrate and calcium channel blocker.  She was treated with 81 mg aspirin, Lipitor, and continued on clonidine.  She was recently hospitalized 12/2022 with lower GI bleed secondary to Crohn's disease.  Bleed felt related to diverticulum.  She required blood transfusion and was evaluated by GI.  She has been residing at a rehab facility.  She was readmitted on 02/07/2023 with cough productive of scant sputum, shortness of breath, and  anorexia x 7 days.  She was found to have multifocal pneumonia, bilateral pleural effusions, subsegmental PE, left lower extremity DVT.    Cardiology is consulted for heart failure exacerbation.   She has had a difficult clinical course over the past 2+ months.  She suffered a fall and broke her hip and also injured her knee.  She did have her hip surgically repaired and discharged to rehab.  While in rehab, she suffered lower GI bleed, described above.  She discharged from rehab home on Wednesday, 01/29/2023, but had to call EMS to get her out of the car at home.  She noted that her O2 sat was 82% on her normal 4 L.  Cough and shortness of breath progressed at home prompting readmission on 02/07/2023.  She notes that while in rehab she was only receiving 20 mg of Lasix, her home dose was 60 mg of Lasix.  She became hypervolemic and they increased her to 60 mg of Lasix twice daily for short course which resulted in brisk diuresis.  Today she denies chest pain.  Her shortness of breath is stable.  She is concerned that she has not been restarted on all of her home hypertensive medications.  We discussed that her blood pressure is borderline and may not require all of her home medications immediately.  She has an appointment to establish care with Dr. Lalla Brothers in EP clinic.  Given her CAD and mild cardiomyopathy, I think she would benefit from general cardiology as well. She  has COPD and is on 4L O2 at home.   Past Medical History:  Diagnosis Date   AAA (abdominal aortic aneurysm) (HCC)    CHF (congestive heart failure) (HCC)    Crohn's disease (HCC)    Diverticulitis    Diverticulosis    GERD (gastroesophageal reflux disease)    GI bleed    Hypertension    OSA (obstructive sleep apnea)    Pacemaker    Thyroid nodule     Past Surgical History:  Procedure Laterality Date   CHOLECYSTECTOMY     COLONOSCOPY WITH PROPOFOL N/A 06/01/2015   Procedure: COLONOSCOPY WITH PROPOFOL;  Surgeon: Charlott Rakes, MD;  Location: [facility] ENDOSCOPY;  Service: Endoscopy;  Laterality: N/A;   CORONARY ANGIOPLASTY WITH STENT PLACEMENT     ESOPHAGOGASTRODUODENOSCOPY (EGD) WITH PROPOFOL N/A 06/01/2015   Procedure: ESOPHAGOGASTRODUODENOSCOPY (EGD) WITH PROPOFOL;  Surgeon: Charlott Rakes, MD;  Location: [facility] ENDOSCOPY;  Service: Endoscopy;  Laterality: N/A;   GIVENS CAPSULE STUDY N/A 06/03/2015   Procedure: GIVENS CAPSULE STUDY;  Surgeon: Charlott Rakes, MD;  Location: [facility] ENDOSCOPY;  Service: Endoscopy;  Laterality: N/A;   INTRAMEDULLARY (IM) NAIL INTERTROCHANTERIC Right 10/30/2022   Procedure: INTRAMEDULLARY nailing of right femur;  Surgeon: Joen Laura, MD;  Location: [facility] OR;  Service: Orthopedics;  Laterality: Right;   PACEMAKER PLACEMENT  2015     Home Medications:  Prior to Admission medications   Medication Sig Start Date End Date Taking? Authorizing Provider  albuterol (VENTOLIN HFA) 108 (90 Base) MCG/ACT inhaler Inhale 2 puffs into the lungs every 6 (six) hours as needed for wheezing or shortness of breath. 07/24/22  Yes [name], MD  aspirin EC 81 MG tablet Take 1 tablet (81 mg total) by mouth daily. Swallow whole. 03/29/21  Yes [name], MD  atorvastatin (LIPITOR) 40 MG tablet Take 40 mg by mouth at bedtime.   Yes [provider]  Cholecalciferol (VITAMIN D-3 PO) Take 1 capsule by mouth daily.   Yes [provider]  docusate sodium (COLACE) 100 MG capsule Take 1 capsule (100 mg total) by mouth daily. 01/04/23  Yes [name], DO  ferrous sulfate 325 (65 FE) MG tablet Take 325 mg by mouth daily.   Yes [provider]  furosemide (LASIX) 40 MG tablet Take 40 mg by mouth daily.   Yes [provider]  metoprolol succinate (TOPROL-XL) 25 MG 24 hr tablet Take 25 mg by mouth daily.   Yes [provider]  mometasone-formoterol (DULERA) 200-5 MCG/ACT AERO Inhale 2 puffs into the lungs 2 (two) times daily.   Yes [provider]   montelukast (SINGULAIR) 10 MG tablet Take 1 tablet (10 mg total) by mouth daily. 08/01/21  Yes [name], Adewale A, MD  pantoprazole (PROTONIX) 40 MG tablet Take 40 mg by mouth in the morning and at bedtime.   Yes [provider]  PENTASA 500 MG CR capsule Take 1,000 mg by mouth in the morning and at bedtime.   Yes [provider]  potassium chloride SA (KLOR-CON M) 20 MEQ tablet Take 40 mEq by mouth daily.   Yes [provider]    Inpatient Medications: Scheduled Meds:  aspirin EC  81 mg Oral Daily   atorvastatin  40 mg Oral QHS   benzonatate  200 mg Oral TID   dextromethorphan  15 mg Oral BID   docusate sodium  100 mg Oral BID   feeding supplement  237 mL Oral BID BM   furosemide  40 mg  Oral Daily   guaiFENesin  600 mg Oral BID   melatonin  3 mg Oral QHS   mesalamine  1,000 mg Oral BID   metoprolol succinate  25 mg Oral Daily   mometasone-formoterol  2 puff Inhalation BID   montelukast  10 mg Oral Daily   pantoprazole  40 mg Oral BID   sodium chloride flush  3 mL Intravenous Q12H   Continuous Infusions:  ampicillin-sulbactam (UNASYN) IV 3 g (02/09/23 1324)   heparin 1,000 Units/hr (02/09/23 0919)   PRN Meds: acetaminophen **OR** acetaminophen, albuterol, HYDROcodone bit-homatropine, meclizine, ondansetron (ZOFRAN) IV, polyethylene glycol  Allergies:    Allergies  Allergen Reactions   Motrin [Ibuprofen] Other (See Comments)    GI bleed   Ace Inhibitors Cough   Breztri Aerosphere [Budeson-Glycopyrrol-Formoterol] Hypertension   Cipro [Ciprofloxacin Hcl] Other (See Comments)    Avoid fluoroquinolones due to asc-aortic aneurysm   Levaquin [Levofloxacin] Other (See Comments)    Avoid fluoroquinolones due to asc-aortic aneurysm   Nsaids Nausea And Vomiting and Other (See Comments)    Hx of GI bleed    Social History:   Social History   Socioeconomic History   Marital status: Widowed    Spouse name: Not on file   Number of children: Not on file    Years of education: Not on file   Highest education level: Not on file  Occupational History   Not on file  Tobacco Use   Smoking status: Former   Smokeless tobacco: Never  Vaping Use   Vaping status: Never Used  Substance and Sexual Activity   Alcohol use: No    Comment: Quit in 1985   Drug use: No   Sexual activity: Not on file  Other Topics Concern   Not on file  Social History Narrative   Not on file   Social Drivers of Health   Financial Resource Strain: Not on file  Food Insecurity: No Food Insecurity (02/08/2023)   Hunger Vital Sign    Worried About Running Out of Food in the Last Year: Never true    Ran Out of Food in the Last Year: Never true  Transportation Needs: No Transportation Needs (02/08/2023)   PRAPARE - Administrator, Civil Service (Medical): No    Lack of Transportation (Non-Medical): No  Physical Activity: Not on file  Stress: Not on file  Social Connections: Unknown (02/09/2023)   Social Connection and Isolation Panel [NHANES]    Frequency of Communication with Friends and Family: Patient declined    Frequency of Social Gatherings with Friends and Family: Patient declined    Attends Religious Services: Not on Marketing executive or Organizations: Patient declined    Attends Banker Meetings: Patient declined    Marital Status: Patient declined  Intimate Partner Violence: Not At Risk (02/08/2023)   Humiliation, Afraid, Rape, and Kick questionnaire    Fear of Current or Ex-Partner: No    Emotionally Abused: No    Physically Abused: No    Sexually Abused: No    Family History:    Family History  Problem Relation Age of Onset   CVA Mother    Lung cancer Father    Colon cancer Neg Hx      ROS:  Please see the history of present illness.   All other ROS reviewed and negative.     Physical Exam/Data:   Vitals:   02/08/23 2127 02/09/23 0600 02/09/23 0913 02/09/23 0945  BP: 92/65 97/66  116/67  Pulse: 99  92  87  Resp: 18 18  18   Temp: 97.7 F (36.5 C) 98.2 F (36.8 C)  98.4 F (36.9 C)  TempSrc: Oral Oral  Oral  SpO2: 93% 94% 94% 95%  Weight:      Height:        Intake/Output Summary (Last 24 hours) at 02/09/2023 1331 Last data filed at 02/09/2023 0600 Gross per 24 hour  Intake 988.96 ml  Output 400 ml  Net 588.96 ml      02/07/2023   10:00 PM 12/28/2022    3:33 PM 11/13/2022    5:00 AM  Last 3 Weights  Weight (lbs) 188 lb 0.8 oz 188 lb 215 lb 2.7 oz  Weight (kg) 85.3 kg 85.276 kg 97.6 kg     Body mass index is 30.35 kg/m.  General:  elderly female in NAD HEENT: normal Neck: + JVD Vascular: No carotid bruits; Distal pulses 2+ bilaterally Cardiac:  normal S1, S2; RRR; no murmur  Lungs:  rhonchi throughout, productive cough  Abd: soft, nontender, no hepatomegaly  Ext: no edema Musculoskeletal:  No deformities, BUE and BLE strength normal and equal Skin: warm and dry  Neuro:  CNs 2-12 intact, no focal abnormalities noted Psych:  Normal affect   EKG:  The EKG was personally reviewed and demonstrates:  V-pacing Telemetry:  Telemetry was personally reviewed and demonstrates:  A sensed, V paced, HR 80-100s  Relevant CV Studies:  Echo 02/08/23:  1. Left ventricular ejection fraction, by estimation, is 50 to 55%. The  left ventricle has low normal function. The left ventricle demonstrates  regional wall motion abnormalities with septal hypokinesis. There is mild  concentric left ventricular  hypertrophy. Indeterminate diastolic filling due to E-A fusion.   2. D-shaped septum suggestive of RV pressure/volume overload. Right  ventricular systolic function is moderately reduced. The right ventricular  size is moderately enlarged. There is moderately elevated pulmonary artery  systolic pressure. The estimated  right ventricular systolic pressure is 56.2 mmHg.   3. Left atrial size was mildly dilated.   4. Right atrial size was moderately dilated.   5. The mitral valve is  normal in structure. No evidence of mitral valve  regurgitation. No evidence of mitral stenosis.   6. The aortic valve is tricuspid. There is mild calcification of the  aortic valve. Aortic valve regurgitation is not visualized. No aortic  stenosis is present.   7. Aortic dilatation noted. There is mild dilatation of the aortic root,  measuring 38 mm. There is mild dilatation of the ascending aorta,  measuring 43 mm.   8. The inferior vena cava is normal in size with <50% respiratory  variability, suggesting right atrial pressure of 8 mmHg.   Laboratory Data:  High Sensitivity Troponin:   Recent Labs  Lab 02/07/23 2155 02/08/23 0509  TROPONINIHS 23* 23*     Chemistry Recent Labs  Lab 02/07/23 1635 02/07/23 1645 02/08/23 0509 02/09/23 0416  NA 137 138 135 139  K 4.3 4.2 4.9 4.0  CL 100  --  98 101  CO2 26  --  27 29  GLUCOSE 92  --  76 89  BUN 26*  --  24* 26*  CREATININE 1.04*  --  1.09* 1.17*  CALCIUM 7.8*  --  7.6* 7.6*  GFRNONAA 51*  --  48* 44*  ANIONGAP 11  --  10 9    Recent Labs  Lab 02/07/23 1635  PROT 5.3*  ALBUMIN 2.1*  AST 18  ALT 8  ALKPHOS 70  BILITOT 0.5   Lipids No results for input(s): "CHOL", "TRIG", "HDL", "LABVLDL", "LDLCALC", "CHOLHDL" in the last 168 hours.  Hematology Recent Labs  Lab 02/07/23 2155 02/08/23 0509 02/09/23 0416  WBC 4.6 5.1 5.1  RBC 2.87* 2.95* 2.82*  HGB 8.3* 8.5* 8.2*  HCT 28.0* 29.0* 26.9*  MCV 97.6 98.3 95.4  MCH 28.9 28.8 29.1  MCHC 29.6* 29.3* 30.5  RDW 18.4* 18.6* 18.2*  PLT 293 280 286   Thyroid No results for input(s): "TSH", "FREET4" in the last 168 hours.  BNP Recent Labs  Lab 02/07/23 2155  BNP 829.2*    DDimer No results for input(s): "DDIMER" in the last 168 hours.   Radiology/Studies:  ECHOCARDIOGRAM COMPLETE Result Date: 02/08/2023    ECHOCARDIOGRAM REPORT   Patient Name:   TUNYA HELD Date of Exam: 02/08/2023 Medical Rec #:  [id number]        Height:       66.0 in Accession #:     [id number]       Weight:       188.1 lb Date of Birth:  1930-10-01         BSA:          1.948 m Patient Age:    89 years         BP:           130/67 mmHg Patient Gender: F                HR:           94 bpm. Exam Location:  Inpatient Procedure: 2D Echo, Color Doppler and Cardiac Doppler Indications:    pulmonary emboli  History:        Patient has prior history of Echocardiogram examinations, most                 recent 07/23/2022. CHF, CAD; Risk Factors:Hypertension and                 Dyslipidemia.  Sonographer:    Melissa Morford RDCS (AE, PE) Referring Phys: [id number] RIPUDEEP K RAI IMPRESSIONS  1. Left ventricular ejection fraction, by estimation, is 50 to 55%. The left ventricle has low normal function. The left ventricle demonstrates regional wall motion abnormalities with septal hypokinesis. There is mild concentric left ventricular hypertrophy. Indeterminate diastolic filling due to E-A fusion.  2. D-shaped septum suggestive of RV pressure/volume overload. Right ventricular systolic function is moderately reduced. The right ventricular size is moderately enlarged. There is moderately elevated pulmonary artery systolic pressure. The estimated right ventricular systolic pressure is 56.2 mmHg.  3. Left atrial size was mildly dilated.  4. Right atrial size was moderately dilated.  5. The mitral valve is normal in structure. No evidence of mitral valve regurgitation. No evidence of mitral stenosis.  6. The aortic valve is tricuspid. There is mild calcification of the aortic valve. Aortic valve regurgitation is not visualized. No aortic stenosis is present.  7. Aortic dilatation noted. There is mild dilatation of the aortic root, measuring 38 mm. There is mild dilatation of the ascending aorta, measuring 43 mm.  8. The inferior vena cava is normal in size with <50% respiratory variability, suggesting right atrial pressure of 8 mmHg. FINDINGS  Left Ventricle: Left ventricular ejection fraction, by estimation, is 50 to  55%. The left ventricle has low normal function. The left ventricle demonstrates regional wall motion abnormalities. The left  ventricular internal cavity size was normal in size. There is mild concentric left ventricular hypertrophy. Indeterminate diastolic filling due to E-A fusion. Right Ventricle: D-shaped septum suggestive of RV pressure/volume overload. The right ventricular size is moderately enlarged. No increase in right ventricular wall thickness. Right ventricular systolic function is moderately reduced. There is moderately  elevated pulmonary artery systolic pressure. The tricuspid regurgitant velocity is 3.47 m/s, and with an assumed right atrial pressure of 8 mmHg, the estimated right ventricular systolic pressure is 56.2 mmHg. Left Atrium: Left atrial size was mildly dilated. Right Atrium: Right atrial size was moderately dilated. Pericardium: There is no evidence of pericardial effusion. Mitral Valve: The mitral valve is normal in structure. There is mild calcification of the mitral valve leaflet(s). Mild mitral annular calcification. No evidence of mitral valve regurgitation. No evidence of mitral valve stenosis. Tricuspid Valve: The tricuspid valve is normal in structure. Tricuspid valve regurgitation is mild. Aortic Valve: The aortic valve is tricuspid. There is mild calcification of the aortic valve. Aortic valve regurgitation is not visualized. No aortic stenosis is present. Pulmonic Valve: The pulmonic valve was normal in structure. Pulmonic valve regurgitation is mild to moderate. Aorta: Aortic dilatation noted. There is mild dilatation of the aortic root, measuring 38 mm. There is mild dilatation of the ascending aorta, measuring 43 mm. Venous: The inferior vena cava is normal in size with less than 50% respiratory variability, suggesting right atrial pressure of 8 mmHg. IAS/Shunts: No atrial level shunt detected by color flow Doppler. Additional Comments: A device lead is visualized in the  right ventricle.  LEFT VENTRICLE PLAX 2D LVIDd:         4.00 cm LVIDs:         3.30 cm LV PW:         1.10 cm LV IVS:        1.20 cm  RIGHT VENTRICLE RV S prime:     7.72 cm/s TAPSE (M-mode): 2.1 cm LEFT ATRIUM             Index        RIGHT ATRIUM           Index LA diam:        3.70 cm 1.90 cm/m   RA Area:     24.70 cm LA Vol (A2C):   43.4 ml 22.28 ml/m  RA Volume:   80.60 ml  41.37 ml/m LA Vol (A4C):   62.3 ml 31.98 ml/m LA Biplane Vol: 54.6 ml 28.03 ml/m  AORTIC VALVE LVOT Vmax:   106.00 cm/s LVOT Vmean:  76.400 cm/s LVOT VTI:    0.160 m  AORTA Ao Root diam: 3.80 cm Ao Asc diam:  4.30 cm TRICUSPID VALVE TR Peak grad:   48.2 mmHg TR Vmax:        347.00 cm/s  SHUNTS Systemic VTI: 0.16 m Dalton McleanMD Electronically signed by Wilfred Lacy Signature Date/Time: 02/08/2023/12:56:56 PM    Final    VAS [place] LOWER EXTREMITY VENOUS (DVT) Result Date: 02/08/2023  Lower Venous DVT Study Patient Name:  CHARIDY CAPPELLETTI  Date of Exam:   02/08/2023 Medical Rec #: [id number]         Accession #:    [id number] Date of Birth: 05-25-1930          Patient Gender: F Patient Age:   87 years Exam Location:  [facility] Procedure:      VAS [place] LOWER EXTREMITY VENOUS (DVT) Referring Phys: [facility] GOEL --------------------------------------------------------------------------------  Indications:  Swelling, and Pain. Other Indications: Right femur fracture after a mechanical fall. Performing Technologist: Marilynne Halsted RDMS, RVT  Examination Guidelines: A complete evaluation includes B-mode imaging, spectral Doppler, color Doppler, and power Doppler as needed of all accessible portions of each vessel. Bilateral testing is considered an integral part of a complete examination. Limited examinations for reoccurring indications may be performed as noted. The reflux portion of the exam is performed with the patient in reverse Trendelenburg.  +---------+---------------+---------+-----------+----------+--------------+ RIGHT     CompressibilityPhasicitySpontaneityPropertiesThrombus Aging +---------+---------------+---------+-----------+----------+--------------+ CFV      Full           Yes      Yes                                 +---------+---------------+---------+-----------+----------+--------------+ SFJ      Full                                                        +---------+---------------+---------+-----------+----------+--------------+ FV Prox  Full                                                        +---------+---------------+---------+-----------+----------+--------------+ FV Mid   Full                                                        +---------+---------------+---------+-----------+----------+--------------+ FV DistalFull                                                        +---------+---------------+---------+-----------+----------+--------------+ PFV      Full                                                        +---------+---------------+---------+-----------+----------+--------------+ POP      Full           Yes      Yes                                 +---------+---------------+---------+-----------+----------+--------------+ PTV      Full                                                        +---------+---------------+---------+-----------+----------+--------------+ PERO     Full                                                        +---------+---------------+---------+-----------+----------+--------------+   +---------+---------------+---------+-----------+----------+--------------+  LEFT     CompressibilityPhasicitySpontaneityPropertiesThrombus Aging +---------+---------------+---------+-----------+----------+--------------+ CFV      Full           Yes      Yes                                 +---------+---------------+---------+-----------+----------+--------------+ SFJ      Full                                                         +---------+---------------+---------+-----------+----------+--------------+ FV Prox  Full                                                        +---------+---------------+---------+-----------+----------+--------------+ FV Mid   Full                                                        +---------+---------------+---------+-----------+----------+--------------+ FV DistalFull                                                        +---------+---------------+---------+-----------+----------+--------------+ PFV      Full                                                        +---------+---------------+---------+-----------+----------+--------------+ POP      Full           Yes      Yes                                 +---------+---------------+---------+-----------+----------+--------------+ PTV      Full                                                        +---------+---------------+---------+-----------+----------+--------------+ PERO     Partial                                      Acute          +---------+---------------+---------+-----------+----------+--------------+ Acute DVT in one of the paired peroneal veins.   Summary: RIGHT: - There is no evidence of deep vein thrombosis in the lower extremity.  - No cystic structure found in the popliteal fossa.  LEFT: - Findings consistent with acute deep vein thrombosis involving the left peroneal veins.  - No cystic  structure found in the popliteal fossa.  *See table(s) above for measurements and observations.    Preliminary    DG Swallowing Func-Speech Pathology Result Date: 02/08/2023 Table formatting from the original result was not included. Modified Barium Swallow Study Patient Details Name: THOMASINE KLUTTS MRN: [id number] Date of Birth: July 27, 1930 Today's Date: 02/08/2023 HPI/PMH: HPI: JEANEE FABRE is a 89 y.o. female with medical history significant of osteoarthritis, Crohn's disease,  GERD, hypertension, abdominal aortic aneurysm . Displaced right intertrochanteric femur fracture after a mechanical fall -underwent ORIF by orthopedics on 10/30/2022.subsequent re-hospitalisation and discharged on January 03, 2023 after she was admitted for complaint of rectal bleed suspected to be diverticular in nature requiring several units of blood transfusion patient tells me 3.  This was managed medically CT angiography did not show active bleed.  Although diverticulosis was seen. Patient was at rehab facility.  Has been complaining of cough with some scant sputum production as well as sensation of shortness of breath, fatigue, poor appetite for the last 7 days or so.  Patient was subsequently noted to have sats of 90% by the facility on her chronic 4 L/min of supplementary oxygen.  ER workup is done with CT angiography.  Small segmental pulmonary artery filling defect in the right lower lobe.  Interstitial and groundglass opacities in the lungs bilaterally possible edema or infiltrate.  Patchy airspace disease in the lower lobes bilaterally possible atelectasis or interval infiltrate.  Mild bilateral pleural effusions. Clinical Impression: Clinical Impression: Patient presents with a mild, largely age related oropharyngeal dysphagia characterized by mildly reduced base of tongue and laryngeal weakness and age appropriate initiation of the swallow at the level of the vallecula and pyriform sinuses with intermittent trace penetration of thin liquids. Even when challenged with multiple consecutive large boluses, penetrates were minimal and remained above the vocal cords, clearing with subsequent swallows and cued throat clear. Do not feel that this degree of deficit would account for multifocal PNA. Educated patient on benefits of small sips, slow rate, and possible occassional throat clear to decrease aspiration risks. Patient verbalized understanding. No further SLP needs indicated. Factors that may increase  risk of adverse event in presence of aspiration Rubye Oaks & Clearance Coots 2021): No data recorded Recommendations/Plan: Swallowing Evaluation Recommendations Swallowing Evaluation Recommendations Recommendations: PO diet PO Diet Recommendation: Regular; Thin liquids (Level 0) Liquid Administration via: Cup; Straw Medication Administration: Whole meds with liquid Supervision: Patient able to self-feed Swallowing strategies  : Slow rate; Small bites/sips; Clear throat intermittently Postural changes: Position pt fully upright for meals Oral care recommendations: Oral care BID (2x/day) Treatment Plan Treatment Plan Treatment recommendations: No treatment recommended at this time Follow-up recommendations: No SLP follow up Functional status assessment: Patient has not had a recent decline in their functional status. Recommendations Recommendations for follow up therapy are one component of a multi-disciplinary discharge planning process, led by the attending physician.  Recommendations may be updated based on patient status, additional functional criteria and insurance authorization. Assessment: Orofacial Exam: Orofacial Exam Oral Cavity: Oral Hygiene: WFL Oral Cavity - Dentition: Dentures, bottom; Dentures, top Orofacial Anatomy: WFL Oral Motor/Sensory Function: WFL Anatomy: Anatomy: Presence of cervical hardware Boluses Administered: Boluses Administered Boluses Administered: Thin liquids (Level 0); Puree; Solid  Oral Impairment Domain: Oral Impairment Domain Lip Closure: No labial escape Tongue control during bolus hold: Cohesive bolus between tongue to palatal seal Bolus preparation/mastication: Timely and efficient chewing and mashing Bolus transport/lingual motion: Brisk tongue motion Oral residue: Trace residue lining oral structures Location of  oral residue : Tongue; Floor of mouth Initiation of pharyngeal swallow : Pyriform sinuses  Pharyngeal Impairment Domain: Pharyngeal Impairment Domain Soft palate elevation: No  bolus between soft palate (SP)/pharyngeal wall (PW) Laryngeal elevation: Partial superior movement of thyroid cartilage/partial approximation of arytenoids to epiglottic petiole Anterior hyoid excursion: Partial anterior movement Epiglottic movement: Complete inversion Laryngeal vestibule closure: Incomplete, narrow column air/contrast in laryngeal vestibule Pharyngeal stripping wave : Present - complete Pharyngeal contraction (A/P view only): N/A Pharyngoesophageal segment opening: Complete distension and complete duration, no obstruction of flow Tongue base retraction: Trace column of contrast or air between tongue base and PPW Pharyngeal residue: Collection of residue within or on pharyngeal structures Location of pharyngeal residue: Valleculae; Pharyngeal wall; Pyriform sinuses; Diffuse (>3 areas)  Esophageal Impairment Domain: Esophageal Impairment Domain Esophageal clearance upright position: Complete clearance, esophageal coating Pill: Pill Consistency administered: Thin liquids (Level 0) Thin liquids (Level 0): Impaired (see clinical impressions) Penetration/Aspiration Scale Score: Penetration/Aspiration Scale Score 1.  Material does not enter airway: Puree; Solid; Pill 3.  Material enters airway, remains ABOVE vocal cords and not ejected out: Thin liquids (Level 0) Compensatory Strategies: Compensatory Strategies Compensatory strategies: No   General Information: Caregiver present: No  Diet Prior to this Study: Regular; Thin liquids (Level 0)   Temperature : Normal   Respiratory Status: WFL   Supplemental O2: Nasal cannula   History of Recent Intubation: No  Behavior/Cognition: Alert; Cooperative; Pleasant mood Self-Feeding Abilities: Able to self-feed Baseline vocal quality/speech: Normal Volitional Cough: Able to elicit Volitional Swallow: Able to elicit No data recorded Goal Planning: No data recorded No data recorded No data recorded No data recorded Consulted and agree with results and recommendations:  Patient Pain: Pain Assessment Pain Assessment: No/denies pain End of Session: Start Time:SLP Start Time (ACUTE ONLY): 1030 Stop Time: SLP Stop Time (ACUTE ONLY): 1055 Time Calculation:SLP Time Calculation (min) (ACUTE ONLY): 25 min Charges: SLP Evaluations $ SLP Speech Visit: 1 Visit SLP Evaluations $BSS Swallow: 1 Procedure $MBS Swallow: 1 Procedure SLP visit diagnosis: SLP Visit Diagnosis: Dysphagia, oropharyngeal phase (R13.12) Past Medical History: Past Medical History: Diagnosis Date  AAA (abdominal aortic aneurysm) (HCC)   CHF (congestive heart failure) (HCC)   Crohn's disease (HCC)   Diverticulitis   Diverticulosis   GERD (gastroesophageal reflux disease)   GI bleed   Hypertension   OSA (obstructive sleep apnea)   Pacemaker   Thyroid nodule  Past Surgical History: Past Surgical History: Procedure Laterality Date  CHOLECYSTECTOMY    COLONOSCOPY WITH PROPOFOL N/A 06/01/2015  Procedure: COLONOSCOPY WITH PROPOFOL;  Surgeon: Charlott Rakes, MD;  Location: [facility] ENDOSCOPY;  Service: Endoscopy;  Laterality: N/A;  CORONARY ANGIOPLASTY WITH STENT PLACEMENT    ESOPHAGOGASTRODUODENOSCOPY (EGD) WITH PROPOFOL N/A 06/01/2015  Procedure: ESOPHAGOGASTRODUODENOSCOPY (EGD) WITH PROPOFOL;  Surgeon: Charlott Rakes, MD;  Location: [facility] ENDOSCOPY;  Service: Endoscopy;  Laterality: N/A;  GIVENS CAPSULE STUDY N/A 06/03/2015  Procedure: GIVENS CAPSULE STUDY;  Surgeon: Charlott Rakes, MD;  Location: [facility] ENDOSCOPY;  Service: Endoscopy;  Laterality: N/A;  INTRAMEDULLARY (IM) NAIL INTERTROCHANTERIC Right 10/30/2022  Procedure: INTRAMEDULLARY nailing of right femur;  Surgeon: Joen Laura, MD;  Location: [facility] OR;  Service: Orthopedics;  Laterality: Right;  PACEMAKER PLACEMENT  [address] [place], CCC-SLP McCoy Leah Meryl 02/08/2023, 11:18 AM  CT Angio Chest PE W and/or Wo Contrast Addendum Date: 02/07/2023 ADDENDUM REPORT: 02/07/2023 20:08 ADDENDUM: Critical Value/emergent results were called by telephone at the time of  interpretation on 02/07/2023 at 8:07 pm to provider Dr. Joneen Caraway , who  verbally acknowledged these results. Electronically Signed   By: Thornell Sartorius M.D.   On: 02/07/2023 20:08   Result Date: 02/07/2023 CLINICAL DATA:  Pulmonary embolism suspected, high probability. EXAM: CT ANGIOGRAPHY CHEST WITH CONTRAST TECHNIQUE: Multidetector CT imaging of the chest was performed using the standard protocol during bolus administration of intravenous contrast. Multiplanar CT image reconstructions and MIPs were obtained to evaluate the vascular anatomy. RADIATION DOSE REDUCTION: This exam was performed according to the departmental dose-optimization program which includes automated exposure control, adjustment of the mA and/or kV according to patient size and/or use of iterative reconstruction technique. CONTRAST:  75mL OMNIPAQUE IOHEXOL 350 MG/ML SOLN COMPARISON:  07/19/2022. FINDINGS: Cardiovascular: Heart is enlarged and there is no pericardial effusion. Pacemaker leads are noted in the heart. Multi-vessel coronary artery calcifications are present. There is atherosclerotic calcification of the aorta with aneurysmal dilatation of the ascending aorta measuring 4.3 cm. The pulmonary trunk is distended suggesting underlying pulmonary artery hypertension. There is a suspected pulmonary artery filling defect in a segmental pulmonary artery in the right lower lobe. The right ventricle is distended, similar in appearance to the prior exam. Mediastinum/Nodes: Enlarged lymph nodes are present in the mediastinum measuring up to 1.4 cm in the AP window. Enlarged hilar lymph nodes are present bilaterally. No axillary lymphadenopathy. The thyroid gland is enlarged with heterogeneous attenuation. Surgical clips are noted in the region of the left lobe of the thyroid gland. The trachea and esophagus are within normal limits. There is a small hiatal hernia. Lungs/Pleura: Ground-glass opacities and interstitial prominence are present in the  lungs bilaterally. There is patchy atelectasis or infiltrate in the lower lobes bilaterally. Small bilateral pleural effusions are noted, greater on the right than on the left. No pneumothorax is seen. Upper Abdomen: The gallbladder is surgically absent. Scattered diverticula are noted along the colon without diverticulitis. No acute abnormality. Musculoskeletal: A pacemaker device is noted in the left chest wall. Degenerative changes are noted in the thoracic spine. No acute osseous abnormality is seen. Review of the MIP images confirms the above findings. IMPRESSION: 1. Small segmental pulmonary artery filling defect in the right lower lobe. The right ventricle is distended, similar in appearance to prior exams which may reflect right heart failure. The possibility of superimposed right heart strain can not be excluded. 2. Interstitial and ground-glass opacities in the lungs bilaterally, possible edema or infiltrate. 3. Patchy airspace disease in the lower lobes bilaterally, possible atelectasis or infiltrate. 4. Small bilateral pleural effusions. 5. Coronary artery calcifications. 6. Distended pulmonary trunk suggesting underlying pulmonary artery hypertension. 7. Aortic atherosclerosis with aneurysmal dilatation of the ascending aorta measuring 4.3 cm. Recommend annual imaging followup by CTA or MRA. This recommendation follows 2010 ACCF/AHA/AATS/ACR/ASA/SCA/SCAI/SIR/STS/SVM Guidelines for the Diagnosis and Management of Patients with Thoracic Aortic Disease. Circulation. 2010; 121: Z610-R604. Aortic aneurysm NOS (ICD10-I71.9) Electronically Signed: By: Thornell Sartorius M.D. On: 02/07/2023 20:02   DG Chest Port 1 View Result Date: 02/07/2023 CLINICAL DATA:  Lethargy and weakness. EXAM: PORTABLE CHEST 1 VIEW COMPARISON:  Chest radiograph dated November 09, 2022. FINDINGS: Stable cardiomediastinal silhouette. Stable dual lead left-sided pacemaker in place. Aortic atherosclerosis. Streaky bibasilar opacities.  Similar blunting of the left costophrenic angle. No pneumothorax. Partially visualized postsurgical changes of the cervical spine. Surgical staples along the left inferomedial neck. No acute osseous abnormality. IMPRESSION: 1. Streaky bibasilar opacities could reflect atelectasis, edema, or infiltrate. 2. Similar blunting of the left costophrenic angle may reflect a trace pleural effusion versus overlying soft tissue. Electronically  Signed   By: Hart Robinsons M.D.   On: 02/07/2023 15:23     Assessment and Plan:   Acute on chronic diastolic heart failure Mild cardiomyopathy Moderate RV dysfunction, D-shaped septum, elevated PASP - LVEF 60-65% with Grade 1 DD - 07/2022 - echo this admission with LVEF 50-55% with  - BNP 839 - on exam, she has JVP, but no peripheral swelling - continue 40 mg lasix PO - I suspect her respiratory status is likely related to COPD, chronic respiratory failure, and PE - add back GDMT as able, BP marginal now   CAD PCI with stenting to RCA --> ISR with repeat stenting in RCA ?2017 - would continue ASA if able, understand she is at higher bleeding risk with recent GI bleed and now with OAC need - no chest pain - continue statin - continue toprol - home imdur, ranexa, amlodipine, losartan, and clonidine have not been restarted - given lack of chest pain, would first add GDMT for CHF - would hold off on SGLT2i given decreased mobility - may be able to add on later, previously active at home   CAP, multifocal pneumonia Acute respiratory failure with hypoxia B pleural effusions - per primary   Acute subsegmental PE Left lower extremity DVT - now on IV heparin - [place] need treatment dose eliquis   Hx of Crohn's disease Recent diverticular bleed 12/2022 GERD - continue PPI, pentasa    Risk Assessment/Risk Scores:       New York Heart Association (NYHA) Functional Class NYHA Class IV       For questions or updates, please contact [facility] Please consult [email or web address] for contact info under    Signed, Marcelino Duster, PA  02/09/2023 1:31 PM   I have seen and examined this patient with Marylene Land the [facility].  Agree with above, note added to reflect my findings.  Patient with a history significant for coronary artery disease, pacemaker, AAA, COPD.  She was admitted to the hospital after being weak, fatigued, short of breath.  Prior to that, she had a fall with a hip fracture this past November.  She was then hospitalized with a lower GI bleed secondary to Crohn's disease.  Was felt due to diverticular bleed.  She was at rehab.  When she was discharged from rehab, had significant weakness, fatigue, shortness of breath.  On presentation in the emergency room, she was found to have pneumonia and a pulmonary embolism.  She has been on antibiotics and IV heparin with improvement in her symptoms.  GEN: Well nourished, well developed, in no acute distress  HEENT: normal  Neck: no JVD, carotid bruits, or masses Cardiac: RRR; no murmurs, rubs, or gallops,no edema  Respiratory: Crackles throughout  GI: soft, nontender, nondistended, + BS MS: no deformity or atrophy  Skin: warm and dry, device site well healed Neuro:  Strength and sensation are intact Psych: euthymic mood, full affect   Acute on chronic diastolic heart failure: Patient is net positive since admission.  Despite this, she says that her weight is at its baseline.  Additionally, right atrial pressure is 8 mmHg in the setting of a pulmonary embolism.  For now, would hold on IV diuresis.  She is on 60 mg of Lasix daily which would be reasonable to continue. Coronary artery disease: Post RCA stenting.  No plans for ischemic evaluation. Pacemaker: Appears to be functioning appropriately on telemetry.  Has follow-up in EP clinic. Pulmonary embolism: Heparin per primary team Multifocal community-acquired  pneumonia: Antibiotics per primary team  Shanyiah Conde M. Wylee Dorantes  MD 02/09/2023 1:43 PM

## 2023-02-09 NOTE — Progress Notes (Signed)
PHARMACY - ANTICOAGULATION CONSULT NOTE  Pharmacy Consult for heparin Indication: pulmonary embolus and DVT  Allergies  Allergen Reactions   Motrin [Ibuprofen] Other (See Comments)    GI bleed   Ace Inhibitors Cough   Breztri Aerosphere [Budeson-Glycopyrrol-Formoterol] Hypertension   Cipro [Ciprofloxacin Hcl] Other (See Comments)    Avoid fluoroquinolones due to asc-aortic aneurysm   Levaquin [Levofloxacin] Other (See Comments)    Avoid fluoroquinolones due to asc-aortic aneurysm   Nsaids Nausea And Vomiting and Other (See Comments)    Hx of GI bleed    Patient Measurements: Height: 5\' 6"  (167.6 cm) Weight: 85.3 kg (188 lb 0.8 oz) IBW/kg (Calculated) : 59.3 Heparin Dosing Weight: 77.5kg  Vital Signs: Temp: 98.4 F (36.9 C) (02/01 0945) Temp Source: Oral (02/01 0945) BP: 116/67 (02/01 0945) Pulse Rate: 87 (02/01 0945)  Labs: Recent Labs    02/07/23 1635 02/07/23 1645 02/07/23 2155 02/08/23 0509 02/08/23 2154 02/09/23 0416 02/09/23 0805 02/09/23 1834  HGB  --    < > 8.3* 8.5*  --  8.2*  --   --   HCT  --    < > 28.0* 29.0*  --  26.9*  --   --   PLT  --   --  293 280  --  286  --   --   APTT  --   --   --  30  --   --   --   --   LABPROT  --   --   --  16.1*  --   --   --   --   INR  --   --   --  1.3*  --   --   --   --   HEPARINUNFRC  --   --   --   --  0.75*  --  0.81* 0.65  CREATININE 1.04*  --   --  1.09*  --  1.17*  --   --   TROPONINIHS  --   --  23* 23*  --   --   --   --    < > = values in this interval not displayed.    Estimated Creatinine Clearance: 35.2 mL/min (A) (by C-G formula based on SCr of 1.17 mg/dL (H)).  Assessment: 89 yo female presenting with shortness of breath found to have tiny subsegmental PE, also found to have DVT in L calf. Noted recent diverticular rectal bleeding 12/2022. PCCM consulted - trial anticoag but low threshold for IVC filter if bleeding occurs. Enox prophylaxis dosing given 1/31 @ 0827 but otherwise no AC.  Heparin  level 0.65 (therapeutic) on heparin 1000 units/hr. No issues with the infusion or bleeding reported.  Goal of Therapy:  Heparin level 0.3-0.7 units/ml Monitor platelets by anticoagulation protocol: Yes   Plan:  Continue heparin drip at 1000 units/hr Check confirmatory heparin level with AM labs F/u with oral AC  Loralee Pacas, PharmD, BCPS 02/09/2023 7:32 PM  Please check AMION for all [facility] Pharmacy phone numbers After 10:00 PM, call Main Pharmacy [phone]

## 2023-02-10 ENCOUNTER — Encounter (HOSPITAL_COMMUNITY): Payer: Self-pay | Admitting: Internal Medicine

## 2023-02-10 ENCOUNTER — Inpatient Hospital Stay (HOSPITAL_COMMUNITY): Payer: Medicare Other

## 2023-02-10 DIAGNOSIS — I2699 Other pulmonary embolism without acute cor pulmonale: Secondary | ICD-10-CM | POA: Diagnosis not present

## 2023-02-10 DIAGNOSIS — I2510 Atherosclerotic heart disease of native coronary artery without angina pectoris: Secondary | ICD-10-CM | POA: Diagnosis not present

## 2023-02-10 DIAGNOSIS — Z9861 Coronary angioplasty status: Secondary | ICD-10-CM

## 2023-02-10 DIAGNOSIS — K921 Melena: Secondary | ICD-10-CM

## 2023-02-10 DIAGNOSIS — J189 Pneumonia, unspecified organism: Secondary | ICD-10-CM | POA: Diagnosis not present

## 2023-02-10 DIAGNOSIS — I5033 Acute on chronic diastolic (congestive) heart failure: Secondary | ICD-10-CM | POA: Diagnosis not present

## 2023-02-10 LAB — CBC
HCT: 26.2 % — ABNORMAL LOW (ref 36.0–46.0)
Hemoglobin: 7.9 g/dL — ABNORMAL LOW (ref 12.0–15.0)
MCH: 28.9 pg (ref 26.0–34.0)
MCHC: 30.2 g/dL (ref 30.0–36.0)
MCV: 96 fL (ref 80.0–100.0)
Platelets: 261 10*3/uL (ref 150–400)
RBC: 2.73 MIL/uL — ABNORMAL LOW (ref 3.87–5.11)
RDW: 18.4 % — ABNORMAL HIGH (ref 11.5–15.5)
WBC: 5.6 10*3/uL (ref 4.0–10.5)
nRBC: 0 % (ref 0.0–0.2)

## 2023-02-10 LAB — OCCULT BLOOD X 1 CARD TO LAB, STOOL: Fecal Occult Bld: NEGATIVE

## 2023-02-10 LAB — BASIC METABOLIC PANEL
Anion gap: 8 (ref 5–15)
BUN: 26 mg/dL — ABNORMAL HIGH (ref 8–23)
CO2: 28 mmol/L (ref 22–32)
Calcium: 7.6 mg/dL — ABNORMAL LOW (ref 8.9–10.3)
Chloride: 101 mmol/L (ref 98–111)
Creatinine, Ser: 1.27 mg/dL — ABNORMAL HIGH (ref 0.44–1.00)
GFR, Estimated: 40 mL/min — ABNORMAL LOW (ref >60)
Glucose, Bld: 97 mg/dL (ref 70–99)
Potassium: 3.5 mmol/L (ref 3.5–5.1)
Sodium: 137 mmol/L (ref 135–145)

## 2023-02-10 LAB — HEPARIN LEVEL (UNFRACTIONATED): Heparin Unfractionated: 0.51 [IU]/mL (ref 0.30–0.70)

## 2023-02-10 MED ORDER — PHENYLEPHRINE-MINERAL OIL-PET 0.25-14-74.9 % RE OINT
1.0000 | TOPICAL_OINTMENT | Freq: Three times a day (TID) | RECTAL | Status: DC | PRN
  Filled 2023-02-10: qty 57

## 2023-02-10 NOTE — Progress Notes (Signed)
Pharmacy Antibiotic Note  Felicia Benson is a 89 y.o. female admitted on 02/07/2023 from SNF with generalized weakness/fatigue, now concerns for aspiration pneumonia. Pharmacy has been consulted for Unasyn dosing.  Pt is on D4 of unasyn for PNA. Wbc wnl, AF. Recommended 5d for abx. Ongoing issue with anemia. Asked to stop heparin pending IR consult for IVC filter.  Plan: -Unasyn 3g IV every 6 hours  Height: 5\' 6"  (167.6 cm) Weight: 85.3 kg (188 lb 0.8 oz) IBW/kg (Calculated) : 59.3  Temp (24hrs), Avg:98.2 F (36.8 C), Min:97.7 F (36.5 C), Max:98.6 F (37 C)  Recent Labs  Lab 02/07/23 1635 02/07/23 2155 02/08/23 0509 02/09/23 0416 02/10/23 0615  WBC  --  4.6 5.1 5.1 5.6  CREATININE 1.04*  --  1.09* 1.17* 1.27*    Estimated Creatinine Clearance: 32.4 mL/min (A) (by C-G formula based on SCr of 1.27 mg/dL (H)).    Allergies  Allergen Reactions   Motrin [Ibuprofen] Other (See Comments)    GI bleed   Ace Inhibitors Cough   Breztri Aerosphere [Budeson-Glycopyrrol-Formoterol] Hypertension   Cipro [Ciprofloxacin Hcl] Other (See Comments)    Avoid fluoroquinolones due to asc-aortic aneurysm   Levaquin [Levofloxacin] Other (See Comments)    Avoid fluoroquinolones due to asc-aortic aneurysm   Nsaids Nausea And Vomiting and Other (See Comments)    Hx of GI bleed    Antimicrobials this admission: Unasyn 1/31 >>  Azithromycin 1/30 >>2/1  Ulyses Southward, PharmD, BCIDP, AAHIVP, CPP Infectious Disease Pharmacist 02/10/2023 9:46 AM

## 2023-02-10 NOTE — Progress Notes (Signed)
[facility]                                                                              Felicia Benson, is a 89 y.o. female, DOB - 1930-10-22, ZOX:[id number] Admit date - 02/07/2023    Outpatient Primary MD for the patient is [name], MD  LOS - 3  days  Chief Complaint  Patient presents with   Pneumonia       Brief summary   Patient is a 89 year old female with osteoarthritis, Crohn's disease, GERD, HTN, AAA, admitted in 10/2022 with right hip fracture after mechanical fall and underwent ORIF, hospitalization in 12/2022 for rectal bleeding suspected to be diverticular bleeding 3 RBC transfusions.  CT angiography did not show active bleed.  Patient presented from SNF with cough, shortness of breath, fatigue, poor appetite for a week.  She was noted to have O2 sats of 90% by the facility on her chronic 4 L O2.  Denied any fevers, leg swelling, chest pain or palpitations. CTA chest showed small segmental pulmonary artery filling defect in the right lower lobe, interstitial and groundglass opacities in the lungs bilaterally, possible edema or infiltrate.  Patchy airspace disease in the lower lobes bilaterally.  Mild bilateral pleural effusions.   Assessment & Plan    Principal Problem:  CAP (community acquired pneumonia), multifocal Acute respiratory failure with hypoxia -Flu, COVID, RSV negative -Mild elevated troponin 23-23, likely demand ischemia -Completed IV Zithromax, continue IV Unasyn for total of 7 days, congested, coughing -MBS done, mild age-related oropharyngeal dysphagia -Continue Tessalon Perles, Mucinex, Hycodan for cough -Incentive spirometry  Volume overload with bilateral pleural effusions, mild acute on chronic diastolic CHF Right ventricular dysfunction (new from previous echo 07/23/2022) -2D echo 07/2022 had shown EF 60 to 65%, G1 DD -BNP 829.2, CTA with bilateral pleural effusions -Received extra Lasix 20 mg IV x 1 on 1/31, continue Lasix  40 mg daily -2D echo showed EF of 50 to 55%, septal hypokinesis, indeterminate diastolic filling.  D-shaped septum suggestive of RV pressure/volume overload, moderately reduced right ventricular systolic function.  Right ventricular size moderately large, moderately elevated PA systolic pressure. -Right ventricular dysfunction appears to be new from previous echo on 07/23/2022 -Cardiology consulted.    Acute pulmonary embolism (HCC), subsegmental PE. -CTA chest showed small subsegmental pulmonary artery filling defect of the right lower lobe.  Right ventricular distended, may reflect right heart strain, interstitial and groundglass opacities in the lungs bilaterally possible edema or infiltrate.  Patchy airspace disease in the lower lobes. -I discussed with radiology, Dr. Llana Aliment who reviewed the CTA and confirmed that patient indeed has small subsegmental PE.  Appreciate pulmonology recommendations -  Venous Doppler showed acute DVT involving the left peroneal veins -Patient was placed on IV heparin drip, hemoglobin on admission 9.2, has continued to trend down to 7.9 today  -Will hold heparin drip, check FOBT, CT abdomen to rule out RPH.   -Given high risk of bleeding and recent history of diverticular bleed in 10/2022, IR consult for consideration of IVC filter.  Discussed in detail with the patient and her daughter who is agreeable with the plan.  History of Crohn's disease, recent diverticular bleed in 12/2022, GERD -Continue Protonix -Not in flare, continue Pentasa   CAD status post PCI, HLP -Currently no acute chest pain, continue aspirin, statin, metoprolol  Chronic kidney disease stage III a -Creatinine currently at baseline   Obesity class II Estimated body mass index is 30.35 kg/m as calculated from the following:   Height as of this encounter: 5\' 6"  (1.676 m).   Weight as of this encounter: 85.3 kg.  Code Status: DNR DVT Prophylaxis:  SCDs Start: 02/07/23  2215   Level of Care: Level of care: Telemetry Medical Family Communication:   Called patient's daughter, [name] on the phone and updated in detail, she is agreeable with the plan for IVC filter. Disposition Plan:      Remains inpatient appropriate:      Procedures:    Consultants:   Pulmonology IR Cardiology Hello  Antimicrobials:   Anti-infectives (From admission, onward)    Start     Dose/Rate Route Frequency Ordered Stop   02/08/23 1000  azithromycin (ZITHROMAX) 500 mg in sodium chloride 0.9 % 250 mL IVPB        500 mg 250 mL/hr over 60 Minutes Intravenous Every 24 hours 02/07/23 2158 02/09/23 2019   02/07/23 2300  Ampicillin-Sulbactam (UNASYN) 3 g in sodium chloride 0.9 % 100 mL IVPB        3 g 200 mL/hr over 30 Minutes Intravenous Every 6 hours 02/07/23 2229 02/13/23 2359   02/07/23 1600  cefTRIAXone (ROCEPHIN) 1 g in sodium chloride 0.9 % 100 mL IVPB  Status:  Discontinued        1 g 200 mL/hr over 30 Minutes Intravenous  Once 02/07/23 1548 02/07/23 2229   02/07/23 1600  azithromycin (ZITHROMAX) 500 mg in sodium chloride 0.9 % 250 mL IVPB        500 mg 250 mL/hr over 60 Minutes Intravenous  Once 02/07/23 1548 02/07/23 2047          Medications  atorvastatin  40 mg Oral QHS   benzonatate  200 mg Oral TID   dextromethorphan  15 mg Oral BID   docusate sodium  100 mg Oral BID   feeding supplement  237 mL Oral BID BM   furosemide  40 mg Oral Daily   guaiFENesin  600 mg Oral BID   melatonin  3 mg Oral QHS   mesalamine  1,000 mg Oral BID   metoprolol succinate  25 mg Oral Daily   mometasone-formoterol  2 puff Inhalation BID   montelukast  10 mg Oral Daily   pantoprazole  40 mg Oral BID   sodium chloride flush  3 mL Intravenous Q12H      Subjective:   Felicia Benson was seen and examined today.  Still having a lot of cough and congestion.  Had a BM this morning.  No acute chest pain, dizziness, lightheadedness.  O2 sats 94% on 3 L.  Objective:    Vitals:   02/09/23 2126 02/09/23 2346 02/10/23 0510 02/10/23 0745  BP: 100/72 (!) 94/58 (!) 103/58 116/67  Pulse: (!) 101 94 78 77  Resp: 18 18 18 18   Temp: 97.7 F (36.5 C) 98.2 F (36.8 C) 98.6 F (37 C)   TempSrc: Oral     SpO2: 93% 93% 92% 94%  Weight:      Height:        Intake/Output Summary (Last 24 hours) at 02/10/2023 1023 Last data filed at 02/10/2023 0500 Gross per 24  hour  Intake 240 ml  Output 1126 ml  Net -886 ml     Wt Readings from Last 3 Encounters:  02/07/23 85.3 kg  12/28/22 85.3 kg  11/13/22 97.6 kg   Physical Exam General: Alert and oriented x 3, NAD, coughing Cardiovascular: S1 S2 clear, RRR.  Respiratory: Congested, bilateral rhonchi Gastrointestinal: Soft, nontender, nondistended, NBS Ext: no pedal edema bilaterally Neuro: no new deficits Psych: Normal affect          Data Reviewed:  I have personally reviewed following labs    CBC Lab Results  Component Value Date   WBC 5.6 02/10/2023   RBC 2.73 (L) 02/10/2023   HGB 7.9 (L) 02/10/2023   HCT 26.2 (L) 02/10/2023   MCV 96.0 02/10/2023   MCH 28.9 02/10/2023   PLT 261 02/10/2023   MCHC 30.2 02/10/2023   RDW 18.4 (H) 02/10/2023   LYMPHSABS 1.7 12/30/2022   MONOABS 1.2 (H) 12/30/2022   EOSABS 0.2 12/30/2022   BASOSABS 0.0 12/30/2022     Last metabolic panel Lab Results  Component Value Date   NA 137 02/10/2023   K 3.5 02/10/2023   CL 101 02/10/2023   CO2 28 02/10/2023   BUN 26 (H) 02/10/2023   CREATININE 1.27 (H) 02/10/2023   GLUCOSE 97 02/10/2023   GFRNONAA 40 (L) 02/10/2023   GFRAA 44 (L) 05/21/2019   CALCIUM 7.6 (L) 02/10/2023   PHOS 3.2 05/21/2019   PROT 5.3 (L) 02/07/2023   ALBUMIN 2.1 (L) 02/07/2023   BILITOT 0.5 02/07/2023   ALKPHOS 70 02/07/2023   AST 18 02/07/2023   ALT 8 02/07/2023   ANIONGAP 8 02/10/2023    CBG (last 3)  No results for input(s): "GLUCAP" in the last 72 hours.    Coagulation Profile: Recent Labs  Lab 02/08/23 0509  INR 1.3*      Radiology Studies: I have personally reviewed the imaging studies  VAS [place] LOWER EXTREMITY VENOUS (DVT) Result Date: 02/09/2023  Lower Venous DVT Study Patient Name:  Felicia Benson  Date of Exam:   02/08/2023 Medical Rec #: [id number]         Accession #:    [id number] Date of Birth: 1930-03-20          Patient Gender: F Patient Age:   37 years Exam Location:  [facility] Procedure:      VAS [place] LOWER EXTREMITY VENOUS (DVT) Referring Phys: [facility] GOEL --------------------------------------------------------------------------------  Indications: Swelling, and Pain. Other Indications: Right femur fracture after a mechanical fall. Performing Technologist: Marilynne Halsted RDMS, RVT  Examination Guidelines: A complete evaluation includes B-mode imaging, spectral Doppler, color Doppler, and power Doppler as needed of all accessible portions of each vessel. Bilateral testing is considered an integral part of a complete examination. Limited examinations for reoccurring indications may be performed as noted. The reflux portion of the exam is performed with the patient in reverse Trendelenburg.  +---------+---------------+---------+-----------+----------+--------------+ RIGHT    CompressibilityPhasicitySpontaneityPropertiesThrombus Aging +---------+---------------+---------+-----------+----------+--------------+ CFV      Full           Yes      Yes                                 +---------+---------------+---------+-----------+----------+--------------+ SFJ      Full                                                        +---------+---------------+---------+-----------+----------+--------------+  FV Prox  Full                                                        +---------+---------------+---------+-----------+----------+--------------+ FV Mid   Full                                                         +---------+---------------+---------+-----------+----------+--------------+ FV DistalFull                                                        +---------+---------------+---------+-----------+----------+--------------+ PFV      Full                                                        +---------+---------------+---------+-----------+----------+--------------+ POP      Full           Yes      Yes                                 +---------+---------------+---------+-----------+----------+--------------+ PTV      Full                                                        +---------+---------------+---------+-----------+----------+--------------+ PERO     Full                                                        +---------+---------------+---------+-----------+----------+--------------+   +---------+---------------+---------+-----------+----------+--------------+ LEFT     CompressibilityPhasicitySpontaneityPropertiesThrombus Aging +---------+---------------+---------+-----------+----------+--------------+ CFV      Full           Yes      Yes                                 +---------+---------------+---------+-----------+----------+--------------+ SFJ      Full                                                        +---------+---------------+---------+-----------+----------+--------------+ FV Prox  Full                                                        +---------+---------------+---------+-----------+----------+--------------+  FV Mid   Full                                                        +---------+---------------+---------+-----------+----------+--------------+ FV DistalFull                                                        +---------+---------------+---------+-----------+----------+--------------+ PFV      Full                                                         +---------+---------------+---------+-----------+----------+--------------+ POP      Full           Yes      Yes                                 +---------+---------------+---------+-----------+----------+--------------+ PTV      Full                                                        +---------+---------------+---------+-----------+----------+--------------+ PERO     Partial                                      Acute          +---------+---------------+---------+-----------+----------+--------------+ Acute DVT in one of the paired peroneal veins.    Summary: RIGHT: - There is no evidence of deep vein thrombosis in the lower extremity.  - No cystic structure found in the popliteal fossa.  LEFT: - Findings consistent with acute deep vein thrombosis involving the left peroneal veins.  - No cystic structure found in the popliteal fossa.  *See table(s) above for measurements and observations. Electronically signed by Lemar Livings MD on 02/09/2023 at 4:00:59 PM.    Final    ECHOCARDIOGRAM COMPLETE Result Date: 02/08/2023    ECHOCARDIOGRAM REPORT   Patient Name:   Felicia Benson Date of Exam: 02/08/2023 Medical Rec #:  [id number]        Height:       66.0 in Accession #:    [id number]       Weight:       188.1 lb Date of Birth:  08-23-1930         BSA:          1.948 m Patient Age:    89 years         BP:           130/67 mmHg Patient Gender: F                HR:           94 bpm. Exam Location:  Inpatient Procedure: 2D  Echo, Color Doppler and Cardiac Doppler Indications:    pulmonary emboli  History:        Patient has prior history of Echocardiogram examinations, most                 recent 07/23/2022. CHF, CAD; Risk Factors:Hypertension and                 Dyslipidemia.  Sonographer:    Melissa Morford RDCS (AE, PE) Referring Phys: [id number] Olene Godfrey K Jadence Kinlaw IMPRESSIONS  1. Left ventricular ejection fraction, by estimation, is 50 to 55%. The left ventricle has low normal function. The left ventricle  demonstrates regional wall motion abnormalities with septal hypokinesis. There is mild concentric left ventricular hypertrophy. Indeterminate diastolic filling due to E-A fusion.  2. D-shaped septum suggestive of RV pressure/volume overload. Right ventricular systolic function is moderately reduced. The right ventricular size is moderately enlarged. There is moderately elevated pulmonary artery systolic pressure. The estimated right ventricular systolic pressure is 56.2 mmHg.  3. Left atrial size was mildly dilated.  4. Right atrial size was moderately dilated.  5. The mitral valve is normal in structure. No evidence of mitral valve regurgitation. No evidence of mitral stenosis.  6. The aortic valve is tricuspid. There is mild calcification of the aortic valve. Aortic valve regurgitation is not visualized. No aortic stenosis is present.  7. Aortic dilatation noted. There is mild dilatation of the aortic root, measuring 38 mm. There is mild dilatation of the ascending aorta, measuring 43 mm.  8. The inferior vena cava is normal in size with <50% respiratory variability, suggesting right atrial pressure of 8 mmHg. FINDINGS  Left Ventricle: Left ventricular ejection fraction, by estimation, is 50 to 55%. The left ventricle has low normal function. The left ventricle demonstrates regional wall motion abnormalities. The left ventricular internal cavity size was normal in size. There is mild concentric left ventricular hypertrophy. Indeterminate diastolic filling due to E-A fusion. Right Ventricle: D-shaped septum suggestive of RV pressure/volume overload. The right ventricular size is moderately enlarged. No increase in right ventricular wall thickness. Right ventricular systolic function is moderately reduced. There is moderately  elevated pulmonary artery systolic pressure. The tricuspid regurgitant velocity is 3.47 m/s, and with an assumed right atrial pressure of 8 mmHg, the estimated right ventricular systolic  pressure is 56.2 mmHg. Left Atrium: Left atrial size was mildly dilated. Right Atrium: Right atrial size was moderately dilated. Pericardium: There is no evidence of pericardial effusion. Mitral Valve: The mitral valve is normal in structure. There is mild calcification of the mitral valve leaflet(s). Mild mitral annular calcification. No evidence of mitral valve regurgitation. No evidence of mitral valve stenosis. Tricuspid Valve: The tricuspid valve is normal in structure. Tricuspid valve regurgitation is mild. Aortic Valve: The aortic valve is tricuspid. There is mild calcification of the aortic valve. Aortic valve regurgitation is not visualized. No aortic stenosis is present. Pulmonic Valve: The pulmonic valve was normal in structure. Pulmonic valve regurgitation is mild to moderate. Aorta: Aortic dilatation noted. There is mild dilatation of the aortic root, measuring 38 mm. There is mild dilatation of the ascending aorta, measuring 43 mm. Venous: The inferior vena cava is normal in size with less than 50% respiratory variability, suggesting right atrial pressure of 8 mmHg. IAS/Shunts: No atrial level shunt detected by color flow Doppler. Additional Comments: A device lead is visualized in the right ventricle.  LEFT VENTRICLE PLAX 2D LVIDd:         4.00 cm LVIDs:  3.30 cm LV PW:         1.10 cm LV IVS:        1.20 cm  RIGHT VENTRICLE RV S prime:     7.72 cm/s TAPSE (M-mode): 2.1 cm LEFT ATRIUM             Index        RIGHT ATRIUM           Index LA diam:        3.70 cm 1.90 cm/m   RA Area:     24.70 cm LA Vol (A2C):   43.4 ml 22.28 ml/m  RA Volume:   80.60 ml  41.37 ml/m LA Vol (A4C):   62.3 ml 31.98 ml/m LA Biplane Vol: 54.6 ml 28.03 ml/m  AORTIC VALVE LVOT Vmax:   106.00 cm/s LVOT Vmean:  76.400 cm/s LVOT VTI:    0.160 m  AORTA Ao Root diam: 3.80 cm Ao Asc diam:  4.30 cm TRICUSPID VALVE TR Peak grad:   48.2 mmHg TR Vmax:        347.00 cm/s  SHUNTS Systemic VTI: 0.16 m Dalton McleanMD  Electronically signed by Wilfred Lacy Signature Date/Time: 02/08/2023/12:56:56 PM    Final    DG Swallowing Func-Speech Pathology Result Date: 02/08/2023 Table formatting from the original result was not included. Modified Barium Swallow Study Patient Details Name: Felicia Benson MRN: [id number] Date of Birth: 1930/07/14 Today's Date: 02/08/2023 HPI/PMH: HPI: Felicia Benson is a 89 y.o. female with medical history significant of osteoarthritis, Crohn's disease, GERD, hypertension, abdominal aortic aneurysm . Displaced right intertrochanteric femur fracture after a mechanical fall -underwent ORIF by orthopedics on 10/30/2022.subsequent re-hospitalisation and discharged on January 03, 2023 after she was admitted for complaint of rectal bleed suspected to be diverticular in nature requiring several units of blood transfusion patient tells me 3.  This was managed medically CT angiography did not show active bleed.  Although diverticulosis was seen. Patient was at rehab facility.  Has been complaining of cough with some scant sputum production as well as sensation of shortness of breath, fatigue, poor appetite for the last 7 days or so.  Patient was subsequently noted to have sats of 90% by the facility on her chronic 4 L/min of supplementary oxygen.  ER workup is done with CT angiography.  Small segmental pulmonary artery filling defect in the right lower lobe.  Interstitial and groundglass opacities in the lungs bilaterally possible edema or infiltrate.  Patchy airspace disease in the lower lobes bilaterally possible atelectasis or interval infiltrate.  Mild bilateral pleural effusions. Clinical Impression: Clinical Impression: Patient presents with a mild, largely age related oropharyngeal dysphagia characterized by mildly reduced base of tongue and laryngeal weakness and age appropriate initiation of the swallow at the level of the vallecula and pyriform sinuses with intermittent trace penetration of thin  liquids. Even when challenged with multiple consecutive large boluses, penetrates were minimal and remained above the vocal cords, clearing with subsequent swallows and cued throat clear. Do not feel that this degree of deficit would account for multifocal PNA. Educated patient on benefits of small sips, slow rate, and possible occassional throat clear to decrease aspiration risks. Patient verbalized understanding. No further SLP needs indicated. Factors that may increase risk of adverse event in presence of aspiration Felicia Benson & [employer] Coots 2021): No data recorded Recommendations/Plan: Swallowing Evaluation Recommendations Swallowing Evaluation Recommendations Recommendations: PO diet PO Diet Recommendation: Regular; Thin liquids (Level 0) Liquid Administration via: Cup; Straw Medication Administration: Whole  meds with liquid Supervision: Patient able to self-feed Swallowing strategies  : Slow rate; Small bites/sips; Clear throat intermittently Postural changes: Position pt fully upright for meals Oral care recommendations: Oral care BID (2x/day) Treatment Plan Treatment Plan Treatment recommendations: No treatment recommended at this time Follow-up recommendations: No SLP follow up Functional status assessment: Patient has not had a recent decline in their functional status. Recommendations Recommendations for follow up therapy are one component of a multi-disciplinary discharge planning process, led by the attending physician.  Recommendations may be updated based on patient status, additional functional criteria and insurance authorization. Assessment: Orofacial Exam: Orofacial Exam Oral Cavity: Oral Hygiene: WFL Oral Cavity - Dentition: Dentures, bottom; Dentures, top Orofacial Anatomy: WFL Oral Motor/Sensory Function: WFL Anatomy: Anatomy: Presence of cervical hardware Boluses Administered: Boluses Administered Boluses Administered: Thin liquids (Level 0); Puree; Solid  Oral Impairment Domain: Oral Impairment  Domain Lip Closure: No labial escape Tongue control during bolus hold: Cohesive bolus between tongue to palatal seal Bolus preparation/mastication: Timely and efficient chewing and mashing Bolus transport/lingual motion: Brisk tongue motion Oral residue: Trace residue lining oral structures Location of oral residue : Tongue; Floor of mouth Initiation of pharyngeal swallow : Pyriform sinuses  Pharyngeal Impairment Domain: Pharyngeal Impairment Domain Soft palate elevation: No bolus between soft palate (SP)/pharyngeal wall (PW) Laryngeal elevation: Partial superior movement of thyroid cartilage/partial approximation of arytenoids to epiglottic petiole Anterior hyoid excursion: Partial anterior movement Epiglottic movement: Complete inversion Laryngeal vestibule closure: Incomplete, narrow column air/contrast in laryngeal vestibule Pharyngeal stripping wave : Present - complete Pharyngeal contraction (A/P view only): N/A Pharyngoesophageal segment opening: Complete distension and complete duration, no obstruction of flow Tongue base retraction: Trace column of contrast or air between tongue base and PPW Pharyngeal residue: Collection of residue within or on pharyngeal structures Location of pharyngeal residue: Valleculae; Pharyngeal wall; Pyriform sinuses; Diffuse (>3 areas)  Esophageal Impairment Domain: Esophageal Impairment Domain Esophageal clearance upright position: Complete clearance, esophageal coating Pill: Pill Consistency administered: Thin liquids (Level 0) Thin liquids (Level 0): Impaired (see clinical impressions) Penetration/Aspiration Scale Score: Penetration/Aspiration Scale Score 1.  Material does not enter airway: Puree; Solid; Pill 3.  Material enters airway, remains ABOVE vocal cords and not ejected out: Thin liquids (Level 0) Compensatory Strategies: Compensatory Strategies Compensatory strategies: No   General Information: Caregiver present: No  Diet Prior to this Study: Regular; Thin liquids  (Level 0)   Temperature : Normal   Respiratory Status: WFL   Supplemental O2: Nasal cannula   History of Recent Intubation: No  Behavior/Cognition: Alert; Cooperative; Pleasant mood Self-Feeding Abilities: Able to self-feed Baseline vocal quality/speech: Normal Volitional Cough: Able to elicit Volitional Swallow: Able to elicit No data recorded Goal Planning: No data recorded No data recorded No data recorded No data recorded Consulted and agree with results and recommendations: Patient Pain: Pain Assessment Pain Assessment: No/denies pain End of Session: Start Time:SLP Start Time (ACUTE ONLY): 1030 Stop Time: SLP Stop Time (ACUTE ONLY): 1055 Time Calculation:SLP Time Calculation (min) (ACUTE ONLY): 25 min Charges: SLP Evaluations $ SLP Speech Visit: 1 Visit SLP Evaluations $BSS Swallow: 1 Procedure $MBS Swallow: 1 Procedure SLP visit diagnosis: SLP Visit Diagnosis: Dysphagia, oropharyngeal phase (R13.12) Past Medical History: Past Medical History: Diagnosis Date  AAA (abdominal aortic aneurysm) (HCC)   CHF (congestive heart failure) (HCC)   Crohn's disease (HCC)   Diverticulitis   Diverticulosis   GERD (gastroesophageal reflux disease)   GI bleed   Hypertension   OSA (obstructive sleep apnea)   Pacemaker  Thyroid nodule  Past Surgical History: Past Surgical History: Procedure Laterality Date  CHOLECYSTECTOMY    COLONOSCOPY WITH PROPOFOL N/A 06/01/2015  Procedure: COLONOSCOPY WITH PROPOFOL;  Surgeon: Charlott Rakes, MD;  Location: [facility] ENDOSCOPY;  Service: Endoscopy;  Laterality: N/A;  CORONARY ANGIOPLASTY WITH STENT PLACEMENT    ESOPHAGOGASTRODUODENOSCOPY (EGD) WITH PROPOFOL N/A 06/01/2015  Procedure: ESOPHAGOGASTRODUODENOSCOPY (EGD) WITH PROPOFOL;  Surgeon: Charlott Rakes, MD;  Location: [facility] ENDOSCOPY;  Service: Endoscopy;  Laterality: N/A;  GIVENS CAPSULE STUDY N/A 06/03/2015  Procedure: GIVENS CAPSULE STUDY;  Surgeon: Charlott Rakes, MD;  Location: [facility] ENDOSCOPY;  Service: Endoscopy;  Laterality: N/A;   INTRAMEDULLARY (IM) NAIL INTERTROCHANTERIC Right 10/30/2022  Procedure: INTRAMEDULLARY nailing of right femur;  Surgeon: Joen Laura, MD;  Location: [facility] OR;  Service: Orthopedics;  Laterality: Right;  PACEMAKER PLACEMENT  [address] MA, CCC-SLP [place] Leah Meryl 02/08/2023, 11:18 AM      Thad Ranger M.D. [facility] 02/10/2023, 10:23 AM  Available via Epic secure chat 7am-7pm After 7 pm, please refer to night coverage provider listed on amion.

## 2023-02-10 NOTE — Progress Notes (Signed)
MEWS Progress Note  Patient Details Name: Felicia Benson MRN: [id number] DOB: 06-22-1930 Today's Date: 02/10/2023   MEWS Flowsheet Documentation:  Assess: MEWS Score Temp: 98.2 F (36.8 C) BP: (!) 94/58 MAP (mmHg): 69 Pulse Rate: 94 ECG Heart Rate: 91 Resp: 18 Level of Consciousness: Alert SpO2: 93 % O2 Device: Nasal Cannula Patient Activity (if Appropriate): In bed O2 Flow Rate (L/min): 3 L/min Assess: MEWS Score MEWS Temp: 0 MEWS Systolic: 1 MEWS Pulse: 0 MEWS RR: 0 MEWS LOC: 0 MEWS Score: 1 MEWS Score Color: Green Assess: SIRS CRITERIA SIRS Temperature : 0 SIRS Respirations : 0 SIRS Pulse: 1 SIRS WBC: 0 SIRS Score Sum : 1 Assess: if the MEWS score is Yellow or Red Were vital signs accurate and taken at a resting state?: Yes Does the patient meet 2 or more of the SIRS criteria?: No MEWS guidelines implemented : Yes, yellow Treat MEWS Interventions: Considered administering scheduled or prn medications/treatments as ordered Take Vital Signs Increase Vital Sign Frequency : Yellow: Q2hr x1, continue Q4hrs until patient remains green for 12hrs Escalate MEWS: Escalate: Yellow: Discuss with charge nurse and consider notifying provider and/or RRT        Denton Meek 02/10/2023, 1:11 AM

## 2023-02-10 NOTE — Progress Notes (Signed)
Rounding Note    Patient Name: Felicia Benson Date of Encounter: 02/10/2023  [facility] Cardiologist: None   Subjective   Feeling well without acute complaints.  Had issues with coughing overnight, but feeling well this morning.  Inpatient Medications    Scheduled Meds:  atorvastatin  40 mg Oral QHS   benzonatate  200 mg Oral TID   dextromethorphan  15 mg Oral BID   docusate sodium  100 mg Oral BID   feeding supplement  237 mL Oral BID BM   furosemide  40 mg Oral Daily   guaiFENesin  600 mg Oral BID   melatonin  3 mg Oral QHS   mesalamine  1,000 mg Oral BID   metoprolol succinate  25 mg Oral Daily   mometasone-formoterol  2 puff Inhalation BID   montelukast  10 mg Oral Daily   pantoprazole  40 mg Oral BID   sodium chloride flush  3 mL Intravenous Q12H   Continuous Infusions:  ampicillin-sulbactam (UNASYN) IV 3 g (02/10/23 0636)   PRN Meds: acetaminophen **OR** acetaminophen, albuterol, HYDROcodone bit-homatropine, meclizine, ondansetron (ZOFRAN) IV, polyethylene glycol   Vital Signs    Vitals:   02/09/23 2126 02/09/23 2346 02/10/23 0510 02/10/23 0745  BP: 100/72 (!) 94/58 (!) 103/58 116/67  Pulse: (!) 101 94 78 77  Resp: 18 18 18 18   Temp: 97.7 F (36.5 C) 98.2 F (36.8 C) 98.6 F (37 C)   TempSrc: Oral     SpO2: 93% 93% 92% 94%  Weight:      Height:        Intake/Output Summary (Last 24 hours) at 02/10/2023 1020 Last data filed at 02/10/2023 0500 Gross per 24 hour  Intake 240 ml  Output 1126 ml  Net -886 ml      02/07/2023   10:00 PM 12/28/2022    3:33 PM 11/13/2022    5:00 AM  Last 3 Weights  Weight (lbs) 188 lb 0.8 oz 188 lb 215 lb 2.7 oz  Weight (kg) 85.3 kg 85.276 kg 97.6 kg      Telemetry    A sense, V pace - Personally Reviewed  ECG    None new - Personally Reviewed  Physical Exam   GEN: No acute distress.   Neck: No JVD Cardiac: RRR, no murmurs, rubs, or gallops.  Respiratory: Clear to auscultation bilaterally. GI:  Soft, nontender, non-distended  MS: No edema; No deformity. Neuro:  Nonfocal  Psych: Normal affect   Labs    High Sensitivity Troponin:   Recent Labs  Lab 02/07/23 2155 02/08/23 0509  TROPONINIHS 23* 23*     Chemistry Recent Labs  Lab 02/07/23 1635 02/07/23 1645 02/08/23 0509 02/09/23 0416 02/10/23 0615  NA 137   < > 135 139 137  K 4.3   < > 4.9 4.0 3.5  CL 100  --  98 101 101  CO2 26  --  27 29 28   GLUCOSE 92  --  76 89 97  BUN 26*  --  24* 26* 26*  CREATININE 1.04*  --  1.09* 1.17* 1.27*  CALCIUM 7.8*  --  7.6* 7.6* 7.6*  PROT 5.3*  --   --   --   --   ALBUMIN 2.1*  --   --   --   --   AST 18  --   --   --   --   ALT 8  --   --   --   --  ALKPHOS 70  --   --   --   --   BILITOT 0.5  --   --   --   --   GFRNONAA 51*  --  48* 44* 40*  ANIONGAP 11  --  10 9 8    < > = values in this interval not displayed.    Lipids No results for input(s): "CHOL", "TRIG", "HDL", "LABVLDL", "LDLCALC", "CHOLHDL" in the last 168 hours.  Hematology Recent Labs  Lab 02/08/23 0509 02/09/23 0416 02/10/23 0615  WBC 5.1 5.1 5.6  RBC 2.95* 2.82* 2.73*  HGB 8.5* 8.2* 7.9*  HCT 29.0* 26.9* 26.2*  MCV 98.3 95.4 96.0  MCH 28.8 29.1 28.9  MCHC 29.3* 30.5 30.2  RDW 18.6* 18.2* 18.4*  PLT 280 286 261   Thyroid No results for input(s): "TSH", "FREET4" in the last 168 hours.  BNP Recent Labs  Lab 02/07/23 2155  BNP 829.2*    DDimer No results for input(s): "DDIMER" in the last 168 hours.   Radiology    VAS [place] LOWER EXTREMITY VENOUS (DVT) Result Date: 02/09/2023  Lower Venous DVT Study Patient Name:  Felicia Benson  Date of Exam:   02/08/2023 Medical Rec #: [id number]         Accession #:    [id number] Date of Birth: 04/27/1930          Patient Gender: F Patient Age:   89 years Exam Location:  [facility] Procedure:      VAS [place] LOWER EXTREMITY VENOUS (DVT) Referring Phys: [facility] GOEL --------------------------------------------------------------------------------  Indications:  Swelling, and Pain. Other Indications: Right femur fracture after a mechanical fall. Performing Technologist: Marilynne Halsted RDMS, RVT  Examination Guidelines: A complete evaluation includes B-mode imaging, spectral Doppler, color Doppler, and power Doppler as needed of all accessible portions of each vessel. Bilateral testing is considered an integral part of a complete examination. Limited examinations for reoccurring indications may be performed as noted. The reflux portion of the exam is performed with the patient in reverse Trendelenburg.  +---------+---------------+---------+-----------+----------+--------------+ RIGHT    CompressibilityPhasicitySpontaneityPropertiesThrombus Aging +---------+---------------+---------+-----------+----------+--------------+ CFV      Full           Yes      Yes                                 +---------+---------------+---------+-----------+----------+--------------+ SFJ      Full                                                        +---------+---------------+---------+-----------+----------+--------------+ FV Prox  Full                                                        +---------+---------------+---------+-----------+----------+--------------+ FV Mid   Full                                                        +---------+---------------+---------+-----------+----------+--------------+  FV DistalFull                                                        +---------+---------------+---------+-----------+----------+--------------+ PFV      Full                                                        +---------+---------------+---------+-----------+----------+--------------+ POP      Full           Yes      Yes                                 +---------+---------------+---------+-----------+----------+--------------+ PTV      Full                                                         +---------+---------------+---------+-----------+----------+--------------+ PERO     Full                                                        +---------+---------------+---------+-----------+----------+--------------+   +---------+---------------+---------+-----------+----------+--------------+ LEFT     CompressibilityPhasicitySpontaneityPropertiesThrombus Aging +---------+---------------+---------+-----------+----------+--------------+ CFV      Full           Yes      Yes                                 +---------+---------------+---------+-----------+----------+--------------+ SFJ      Full                                                        +---------+---------------+---------+-----------+----------+--------------+ FV Prox  Full                                                        +---------+---------------+---------+-----------+----------+--------------+ FV Mid   Full                                                        +---------+---------------+---------+-----------+----------+--------------+ FV DistalFull                                                        +---------+---------------+---------+-----------+----------+--------------+  PFV      Full                                                        +---------+---------------+---------+-----------+----------+--------------+ POP      Full           Yes      Yes                                 +---------+---------------+---------+-----------+----------+--------------+ PTV      Full                                                        +---------+---------------+---------+-----------+----------+--------------+ PERO     Partial                                      Acute          +---------+---------------+---------+-----------+----------+--------------+ Acute DVT in one of the paired peroneal veins.    Summary: RIGHT: - There is no evidence of deep vein thrombosis in the  lower extremity.  - No cystic structure found in the popliteal fossa.  LEFT: - Findings consistent with acute deep vein thrombosis involving the left peroneal veins.  - No cystic structure found in the popliteal fossa.  *See table(s) above for measurements and observations. Electronically signed by Lemar Livings MD on 02/09/2023 at 4:00:59 PM.    Final    ECHOCARDIOGRAM COMPLETE Result Date: 02/08/2023    ECHOCARDIOGRAM REPORT   Patient Name:   Felicia Benson Date of Exam: 02/08/2023 Medical Rec #:  [id number]        Height:       66.0 in Accession #:    [id number]       Weight:       188.1 lb Date of Birth:  20-Jan-1930         BSA:          1.948 m Patient Age:    89 years         BP:           130/67 mmHg Patient Gender: F                HR:           94 bpm. Exam Location:  Inpatient Procedure: 2D Echo, Color Doppler and Cardiac Doppler Indications:    pulmonary emboli  History:        Patient has prior history of Echocardiogram examinations, most                 recent 07/23/2022. CHF, CAD; Risk Factors:Hypertension and                 Dyslipidemia.  Sonographer:    Melissa Morford RDCS (AE, PE) Referring Phys: [id number] RIPUDEEP K RAI IMPRESSIONS  1. Left ventricular ejection fraction, by estimation, is 50 to 55%. The left ventricle has low normal function. The left ventricle demonstrates regional wall motion abnormalities with septal hypokinesis. There is mild  concentric left ventricular hypertrophy. Indeterminate diastolic filling due to E-A fusion.  2. D-shaped septum suggestive of RV pressure/volume overload. Right ventricular systolic function is moderately reduced. The right ventricular size is moderately enlarged. There is moderately elevated pulmonary artery systolic pressure. The estimated right ventricular systolic pressure is 56.2 mmHg.  3. Left atrial size was mildly dilated.  4. Right atrial size was moderately dilated.  5. The mitral valve is normal in structure. No evidence of mitral valve  regurgitation. No evidence of mitral stenosis.  6. The aortic valve is tricuspid. There is mild calcification of the aortic valve. Aortic valve regurgitation is not visualized. No aortic stenosis is present.  7. Aortic dilatation noted. There is mild dilatation of the aortic root, measuring 38 mm. There is mild dilatation of the ascending aorta, measuring 43 mm.  8. The inferior vena cava is normal in size with <50% respiratory variability, suggesting right atrial pressure of 8 mmHg. FINDINGS  Left Ventricle: Left ventricular ejection fraction, by estimation, is 50 to 55%. The left ventricle has low normal function. The left ventricle demonstrates regional wall motion abnormalities. The left ventricular internal cavity size was normal in size. There is mild concentric left ventricular hypertrophy. Indeterminate diastolic filling due to E-A fusion. Right Ventricle: D-shaped septum suggestive of RV pressure/volume overload. The right ventricular size is moderately enlarged. No increase in right ventricular wall thickness. Right ventricular systolic function is moderately reduced. There is moderately  elevated pulmonary artery systolic pressure. The tricuspid regurgitant velocity is 3.47 m/s, and with an assumed right atrial pressure of 8 mmHg, the estimated right ventricular systolic pressure is 56.2 mmHg. Left Atrium: Left atrial size was mildly dilated. Right Atrium: Right atrial size was moderately dilated. Pericardium: There is no evidence of pericardial effusion. Mitral Valve: The mitral valve is normal in structure. There is mild calcification of the mitral valve leaflet(s). Mild mitral annular calcification. No evidence of mitral valve regurgitation. No evidence of mitral valve stenosis. Tricuspid Valve: The tricuspid valve is normal in structure. Tricuspid valve regurgitation is mild. Aortic Valve: The aortic valve is tricuspid. There is mild calcification of the aortic valve. Aortic valve regurgitation is not  visualized. No aortic stenosis is present. Pulmonic Valve: The pulmonic valve was normal in structure. Pulmonic valve regurgitation is mild to moderate. Aorta: Aortic dilatation noted. There is mild dilatation of the aortic root, measuring 38 mm. There is mild dilatation of the ascending aorta, measuring 43 mm. Venous: The inferior vena cava is normal in size with less than 50% respiratory variability, suggesting right atrial pressure of 8 mmHg. IAS/Shunts: No atrial level shunt detected by color flow Doppler. Additional Comments: A device lead is visualized in the right ventricle.  LEFT VENTRICLE PLAX 2D LVIDd:         4.00 cm LVIDs:         3.30 cm LV PW:         1.10 cm LV IVS:        1.20 cm  RIGHT VENTRICLE RV S prime:     7.72 cm/s TAPSE (M-mode): 2.1 cm LEFT ATRIUM             Index        RIGHT ATRIUM           Index LA diam:        3.70 cm 1.90 cm/m   RA Area:     24.70 cm LA Vol (A2C):   43.4 ml 22.28 ml/m  RA Volume:  80.60 ml  41.37 ml/m LA Vol (A4C):   62.3 ml 31.98 ml/m LA Biplane Vol: 54.6 ml 28.03 ml/m  AORTIC VALVE LVOT Vmax:   106.00 cm/s LVOT Vmean:  76.400 cm/s LVOT VTI:    0.160 m  AORTA Ao Root diam: 3.80 cm Ao Asc diam:  4.30 cm TRICUSPID VALVE TR Peak grad:   48.2 mmHg TR Vmax:        347.00 cm/s  SHUNTS Systemic VTI: 0.16 m Dalton McleanMD Electronically signed by Wilfred Lacy Signature Date/Time: 02/08/2023/12:56:56 PM    Final    DG Swallowing Func-Speech Pathology Result Date: 02/08/2023 Table formatting from the original result was not included. Modified Barium Swallow Study Patient Details Name: Felicia Benson MRN: [id number] Date of Birth: 1930/03/28 Today's Date: 02/08/2023 HPI/PMH: HPI: Felicia Benson is a 89 y.o. female with medical history significant of osteoarthritis, Crohn's disease, GERD, hypertension, abdominal aortic aneurysm . Displaced right intertrochanteric femur fracture after a mechanical fall -underwent ORIF by orthopedics on 10/30/2022.subsequent  re-hospitalisation and discharged on January 03, 2023 after she was admitted for complaint of rectal bleed suspected to be diverticular in nature requiring several units of blood transfusion patient tells me 3.  This was managed medically CT angiography did not show active bleed.  Although diverticulosis was seen. Patient was at rehab facility.  Has been complaining of cough with some scant sputum production as well as sensation of shortness of breath, fatigue, poor appetite for the last 7 days or so.  Patient was subsequently noted to have sats of 90% by the facility on her chronic 4 L/min of supplementary oxygen.  ER workup is done with CT angiography.  Small segmental pulmonary artery filling defect in the right lower lobe.  Interstitial and groundglass opacities in the lungs bilaterally possible edema or infiltrate.  Patchy airspace disease in the lower lobes bilaterally possible atelectasis or interval infiltrate.  Mild bilateral pleural effusions. Clinical Impression: Clinical Impression: Patient presents with a mild, largely age related oropharyngeal dysphagia characterized by mildly reduced base of tongue and laryngeal weakness and age appropriate initiation of the swallow at the level of the vallecula and pyriform sinuses with intermittent trace penetration of thin liquids. Even when challenged with multiple consecutive large boluses, penetrates were minimal and remained above the vocal cords, clearing with subsequent swallows and cued throat clear. Do not feel that this degree of deficit would account for multifocal PNA. Educated patient on benefits of small sips, slow rate, and possible occassional throat clear to decrease aspiration risks. Patient verbalized understanding. No further SLP needs indicated. Factors that may increase risk of adverse event in presence of aspiration Rubye Oaks [employer] Clearance Coots 2021): No data recorded Recommendations/Plan: Swallowing Evaluation Recommendations Swallowing Evaluation  Recommendations Recommendations: PO diet PO Diet Recommendation: Regular; Thin liquids (Level 0) Liquid Administration via: Cup; Straw Medication Administration: Whole meds with liquid Supervision: Patient able to self-feed Swallowing strategies  : Slow rate; Small bites/sips; Clear throat intermittently Postural changes: Position pt fully upright for meals Oral care recommendations: Oral care BID (2x/day) Treatment Plan Treatment Plan Treatment recommendations: No treatment recommended at this time Follow-up recommendations: No SLP follow up Functional status assessment: Patient has not had a recent decline in their functional status. Recommendations Recommendations for follow up therapy are one component of a multi-disciplinary discharge planning process, led by the attending physician.  Recommendations may be updated based on patient status, additional functional criteria and insurance authorization. Assessment: Orofacial Exam: Orofacial Exam Oral Cavity: Oral Hygiene: WFL Oral Cavity -  Dentition: Dentures, bottom; Dentures, top Orofacial Anatomy: WFL Oral Motor/Sensory Function: WFL Anatomy: Anatomy: Presence of cervical hardware Boluses Administered: Boluses Administered Boluses Administered: Thin liquids (Level 0); Puree; Solid  Oral Impairment Domain: Oral Impairment Domain Lip Closure: No labial escape Tongue control during bolus hold: Cohesive bolus between tongue to palatal seal Bolus preparation/mastication: Timely and efficient chewing and mashing Bolus transport/lingual motion: Brisk tongue motion Oral residue: Trace residue lining oral structures Location of oral residue : Tongue; Floor of mouth Initiation of pharyngeal swallow : Pyriform sinuses  Pharyngeal Impairment Domain: Pharyngeal Impairment Domain Soft palate elevation: No bolus between soft palate (SP)/pharyngeal wall (PW) Laryngeal elevation: Partial superior movement of thyroid cartilage/partial approximation of arytenoids to epiglottic  petiole Anterior hyoid excursion: Partial anterior movement Epiglottic movement: Complete inversion Laryngeal vestibule closure: Incomplete, narrow column air/contrast in laryngeal vestibule Pharyngeal stripping wave : Present - complete Pharyngeal contraction (A/P view only): N/A Pharyngoesophageal segment opening: Complete distension and complete duration, no obstruction of flow Tongue base retraction: Trace column of contrast or air between tongue base and PPW Pharyngeal residue: Collection of residue within or on pharyngeal structures Location of pharyngeal residue: Valleculae; Pharyngeal wall; Pyriform sinuses; Diffuse (>3 areas)  Esophageal Impairment Domain: Esophageal Impairment Domain Esophageal clearance upright position: Complete clearance, esophageal coating Pill: Pill Consistency administered: Thin liquids (Level 0) Thin liquids (Level 0): Impaired (see clinical impressions) Penetration/Aspiration Scale Score: Penetration/Aspiration Scale Score 1.  Material does not enter airway: Puree; Solid; Pill 3.  Material enters airway, remains ABOVE vocal cords and not ejected out: Thin liquids (Level 0) Compensatory Strategies: Compensatory Strategies Compensatory strategies: No   General Information: Caregiver present: No  Diet Prior to this Study: Regular; Thin liquids (Level 0)   Temperature : Normal   Respiratory Status: WFL   Supplemental O2: Nasal cannula   History of Recent Intubation: No  Behavior/Cognition: Alert; Cooperative; Pleasant mood Self-Feeding Abilities: Able to self-feed Baseline vocal quality/speech: Normal Volitional Cough: Able to elicit Volitional Swallow: Able to elicit No data recorded Goal Planning: No data recorded No data recorded No data recorded No data recorded Consulted and agree with results and recommendations: Patient Pain: Pain Assessment Pain Assessment: No/denies pain End of Session: Start Time:SLP Start Time (ACUTE ONLY): 1030 Stop Time: SLP Stop Time (ACUTE ONLY): 1055  Time Calculation:SLP Time Calculation (min) (ACUTE ONLY): 25 min Charges: SLP Evaluations $ SLP Speech Visit: 1 Visit SLP Evaluations $BSS Swallow: 1 Procedure $MBS Swallow: 1 Procedure SLP visit diagnosis: SLP Visit Diagnosis: Dysphagia, oropharyngeal phase (R13.12) Past Medical History: Past Medical History: Diagnosis Date  AAA (abdominal aortic aneurysm) (HCC)   CHF (congestive heart failure) (HCC)   Crohn's disease (HCC)   Diverticulitis   Diverticulosis   GERD (gastroesophageal reflux disease)   GI bleed   Hypertension   OSA (obstructive sleep apnea)   Pacemaker   Thyroid nodule  Past Surgical History: Past Surgical History: Procedure Laterality Date  CHOLECYSTECTOMY    COLONOSCOPY WITH PROPOFOL N/A 06/01/2015  Procedure: COLONOSCOPY WITH PROPOFOL;  Surgeon: Charlott Rakes, MD;  Location: [facility] ENDOSCOPY;  Service: Endoscopy;  Laterality: N/A;  CORONARY ANGIOPLASTY WITH STENT PLACEMENT    ESOPHAGOGASTRODUODENOSCOPY (EGD) WITH PROPOFOL N/A 06/01/2015  Procedure: ESOPHAGOGASTRODUODENOSCOPY (EGD) WITH PROPOFOL;  Surgeon: Charlott Rakes, MD;  Location: [facility] ENDOSCOPY;  Service: Endoscopy;  Laterality: N/A;  GIVENS CAPSULE STUDY N/A 06/03/2015  Procedure: GIVENS CAPSULE STUDY;  Surgeon: Charlott Rakes, MD;  Location: [facility] ENDOSCOPY;  Service: Endoscopy;  Laterality: N/A;  INTRAMEDULLARY (IM) NAIL INTERTROCHANTERIC Right 10/30/2022  Procedure:  INTRAMEDULLARY nailing of right femur;  Surgeon: Joen Laura, MD;  Location: [facility] OR;  Service: Orthopedics;  Laterality: Right;  PACEMAKER PLACEMENT  [address] [place], CCC-SLP McCoy Leah Meryl 02/08/2023, 11:18 AM   Cardiac Studies   TTE  1. Left ventricular ejection fraction, by estimation, is 50 to 55%. The  left ventricle has low normal function. The left ventricle demonstrates  regional wall motion abnormalities with septal hypokinesis. There is mild  concentric left ventricular  hypertrophy. Indeterminate diastolic filling due to E-A fusion.   2.  D-shaped septum suggestive of RV pressure/volume overload. Right  ventricular systolic function is moderately reduced. The right ventricular  size is moderately enlarged. There is moderately elevated pulmonary artery  systolic pressure. The estimated  right ventricular systolic pressure is 56.2 mmHg.   3. Left atrial size was mildly dilated.   4. Right atrial size was moderately dilated.   5. The mitral valve is normal in structure. No evidence of mitral valve  regurgitation. No evidence of mitral stenosis.   6. The aortic valve is tricuspid. There is mild calcification of the  aortic valve. Aortic valve regurgitation is not visualized. No aortic  stenosis is present.   7. Aortic dilatation noted. There is mild dilatation of the aortic root,  measuring 38 mm. There is mild dilatation of the ascending aorta,  measuring 43 mm.   8. The inferior vena cava is normal in size with <50% respiratory  variability, suggesting right atrial pressure of 8 mmHg.   Patient Profile     89 y.o. female presented to the hospital with weakness, fatigue, shortness of breath, found to have multifocal pneumonia  Assessment & Plan    1.  Acute on chronic diastolic heart failure: Patient is net positive on admission by 400 cc.  She does not have obvious volume overload.  Right atrial pressure 8 mmHg on echo.  Would continue oral diuresis.  2.  Coronary artery disease: Post RCA stenting.  No plans for ischemic evaluation.  3.  Pacemaker: Appears to be functioning appropriately.  Has follow-up scheduled for EP clinic  4.  Pulmonary embolism: Continue anticoagulation per primary team  5.  Multifocal pneumonia: Antibiotics per primary team     For questions or updates, please contact [facility] Please consult [email or web address] for contact info under        Signed, Oanh Devivo Jorja Loa, MD  02/10/2023, 10:20 AM

## 2023-02-10 NOTE — Consult Note (Addendum)
Chief Complaint: Patient was seen in consultation today for worsening anemia while on heparin due to DVT/PE Chief Complaint  Patient presents with   Pneumonia   at the request of Rai, Ripudeep   Referring Physician(s): Rai, Ripudeep   Supervising Physician: Richarda Overlie  Patient Status: [facility] - In-pt  History of Present Illness: Felicia Benson is a 89 y.o. female with PMHs of osteoarthritis, Crohn's disease, GERD, HTN, AAA,  right hip fracture s/p ORIF, recent hospitalization in 12/2022 for rectal bleeding suspected to be diverticular bleeding s/p 3 RBC transfusions, re-admitted on 1/30 after found to have CAP, segmental PE, acute  left peroneal DVT started on heparin who presents with worsening anemia, IR was consulted for IVC filter placement.   Since the admission on 02/07/23 patient has been generally hemodynamically stable while on heparin infusion, but hgb trended down from 92 on 1/30 to 7.9 on 2/2. Fecal occult bleed was negative today. CT A/P showed small bilateral effusion, descending diverticulosis, stable left adrenal adenoma. IR was consulted for IVC filter placement, case reviewed and approved by Dr. Lowella Dandy.   Patient laying in bed, not in acute distress.  Denise headache, fever, chills, shortness of breath, cough, chest pain, abdominal pain, nausea ,vomiting, and bleeding.   Past Medical History:  Diagnosis Date   AAA (abdominal aortic aneurysm) (HCC)    CHF (congestive heart failure) (HCC)    Crohn's disease (HCC)    Diverticulitis    Diverticulosis    GERD (gastroesophageal reflux disease)    GI bleed    Hypertension    OSA (obstructive sleep apnea)    Pacemaker    Thyroid nodule     Past Surgical History:  Procedure Laterality Date   CHOLECYSTECTOMY     COLONOSCOPY WITH PROPOFOL N/A 06/01/2015   Procedure: COLONOSCOPY WITH PROPOFOL;  Surgeon: Charlott Rakes, MD;  Location: [facility] ENDOSCOPY;  Service: Endoscopy;  Laterality: N/A;   CORONARY ANGIOPLASTY WITH  STENT PLACEMENT     ESOPHAGOGASTRODUODENOSCOPY (EGD) WITH PROPOFOL N/A 06/01/2015   Procedure: ESOPHAGOGASTRODUODENOSCOPY (EGD) WITH PROPOFOL;  Surgeon: Charlott Rakes, MD;  Location: [facility] ENDOSCOPY;  Service: Endoscopy;  Laterality: N/A;   GIVENS CAPSULE STUDY N/A 06/03/2015   Procedure: GIVENS CAPSULE STUDY;  Surgeon: Charlott Rakes, MD;  Location: [facility] ENDOSCOPY;  Service: Endoscopy;  Laterality: N/A;   INTRAMEDULLARY (IM) NAIL INTERTROCHANTERIC Right 10/30/2022   Procedure: INTRAMEDULLARY nailing of right femur;  Surgeon: Joen Laura, MD;  Location: [facility] OR;  Service: Orthopedics;  Laterality: Right;   PACEMAKER PLACEMENT  2015    Allergies: Motrin [ibuprofen], Ace inhibitors, Breztri aerosphere [budeson-glycopyrrol-formoterol], Cipro [ciprofloxacin hcl], Levaquin [levofloxacin], and Nsaids  Medications: Prior to Admission medications   Medication Sig Start Date End Date Taking? Authorizing Provider  albuterol (VENTOLIN HFA) 108 (90 Base) MCG/ACT inhaler Inhale 2 puffs into the lungs every 6 (six) hours as needed for wheezing or shortness of breath. 07/24/22  Yes [name], MD  aspirin EC 81 MG tablet Take 1 tablet (81 mg total) by mouth daily. Swallow whole. 03/29/21  Yes [name], MD  atorvastatin (LIPITOR) 40 MG tablet Take 40 mg by mouth at bedtime.   Yes [provider]  Cholecalciferol (VITAMIN D-3 PO) Take 1 capsule by mouth daily.   Yes [provider]  docusate sodium (COLACE) 100 MG capsule Take 1 capsule (100 mg total) by mouth daily. 01/04/23  Yes [name], DO  ferrous sulfate 325 (65 FE) MG tablet Take 325 mg by mouth daily.  Yes [provider]  furosemide (LASIX) 40 MG tablet Take 40 mg by mouth daily.   Yes [provider]  metoprolol succinate (TOPROL-XL) 25 MG 24 hr tablet Take 25 mg by mouth daily.   Yes [provider]  mometasone-formoterol (DULERA) 200-5 MCG/ACT AERO Inhale 2 puffs into the lungs 2  (two) times daily.   Yes [provider]  montelukast (SINGULAIR) 10 MG tablet Take 1 tablet (10 mg total) by mouth daily. 08/01/21  Yes [name], Adewale A, MD  pantoprazole (PROTONIX) 40 MG tablet Take 40 mg by mouth in the morning and at bedtime.   Yes [provider]  PENTASA 500 MG CR capsule Take 1,000 mg by mouth in the morning and at bedtime.   Yes [provider]  potassium chloride SA (KLOR-CON M) 20 MEQ tablet Take 40 mEq by mouth daily.   Yes [provider]     Family History  Problem Relation Age of Onset   CVA Mother    Lung cancer Father    Colon cancer Neg Hx     Social History   Socioeconomic History   Marital status: Widowed    Spouse name: Not on file   Number of children: Not on file   Years of education: Not on file   Highest education level: Not on file  Occupational History   Not on file  Tobacco Use   Smoking status: Former   Smokeless tobacco: Never  Vaping Use   Vaping status: Never Used  Substance and Sexual Activity   Alcohol use: No    Comment: Quit in 1985   Drug use: No   Sexual activity: Not on file  Other Topics Concern   Not on file  Social History Narrative   Not on file   Social Drivers of Health   Financial Resource Strain: Not on file  Food Insecurity: No Food Insecurity (02/08/2023)   Hunger Vital Sign    Worried About Running Out of Food in the Last Year: Never true    Ran Out of Food in the Last Year: Never true  Transportation Needs: No Transportation Needs (02/08/2023)   PRAPARE - Administrator, Civil Service (Medical): No    Lack of Transportation (Non-Medical): No  Physical Activity: Not on file  Stress: Not on file  Social Connections: Unknown (02/09/2023)   Social Connection and Isolation Panel [NHANES]    Frequency of Communication with Friends and Family: Patient declined    Frequency of Social Gatherings with Friends and Family: Patient declined    Attends Religious  Services: Not on Marketing executive or Organizations: Patient declined    Attends Banker Meetings: Patient declined    Marital Status: Patient declined     Review of Systems: A 12 point ROS discussed and pertinent positives are indicated in the HPI above.  All other systems are negative.  Vital Signs: BP 116/67 (BP Location: Right Arm)   Pulse 77   Temp 98.6 F (37 C)   Resp 18   Ht 5\' 6"  (1.676 m)   Wt 188 lb 0.8 oz (85.3 kg)   SpO2 94%   BMI 30.35 kg/m    Physical Exam Vitals and nursing note reviewed.  Constitutional:      General: Patient is not in acute distress.    Appearance: Normal appearance. Patient is not ill-appearing.  HENT:     Head: Normocephalic and atraumatic.  Mouth/Throat:     Mouth: Mucous membranes are moist.     Pharynx: Oropharynx is clear.  Cardiovascular:     Rate and Rhythm: Normal rate and regular rhythm.     Pulses: Normal pulses.     Heart sounds: Normal heart sounds.  Pulmonary:     Effort: Pulmonary effort is normal.     Breath sounds: Normal breath sounds.  Abdominal:     General: Abdomen is flat. Bowel sounds are normal.     Palpations: Abdomen is soft.  Musculoskeletal:     Cervical back: Neck supple.  Skin:    General: Skin is warm and dry.     Coloration: Skin is not jaundiced or pale.  Neurological:     Mental Status: Patient is alert and oriented to person, place, and time.  Psychiatric:        Mood and Affect: Mood normal.        Behavior: Behavior normal.        Judgment: Judgment normal.    MD Evaluation Airway: WNL Heart: WNL Abdomen: WNL Chest/ Lungs: WNL ASA  Classification: 3 Mallampati/Airway Score: Two  Imaging: CT ABDOMEN PELVIS WO CONTRAST Result Date: 02/10/2023 CLINICAL DATA:  Acute generalized abdominal pain. EXAM: CT ABDOMEN AND PELVIS WITHOUT CONTRAST TECHNIQUE: Multidetector CT imaging of the abdomen and pelvis was performed following the standard protocol without IV  contrast. RADIATION DOSE REDUCTION: This exam was performed according to the departmental dose-optimization program which includes automated exposure control, adjustment of the mA and/or kV according to patient size and/or use of iterative reconstruction technique. COMPARISON:  December 28, 2022. FINDINGS: Lower chest: Small bilateral pleural effusions are noted with adjacent subsegmental atelectasis, right greater than left. Hepatobiliary: No focal liver abnormality is seen. Status post cholecystectomy. No biliary dilatation. Pancreas: Unremarkable. No pancreatic ductal dilatation or surrounding inflammatory changes. Spleen: Calcified splenic granulomas are noted. Adrenals/Urinary Tract: Stable left adrenal adenoma. Right adrenal gland appears normal. No hydronephrosis or renal obstruction is noted. Urinary bladder is unremarkable. Stomach/Bowel: Stomach is unremarkable. There is no evidence of bowel obstruction. Diverticulosis of descending and sigmoid colon is noted without inflammation. Moderate amount of stool seen in the rectum. The appendix is not clearly visualized. Vascular/Lymphatic: Aortic atherosclerosis. No enlarged abdominal or pelvic lymph nodes. Reproductive: Status post hysterectomy. No adnexal masses. Other: No abdominal wall hernia or abnormality. No abdominopelvic ascites. Musculoskeletal: No acute or significant osseous findings. IMPRESSION: Small bilateral pleural effusions with adjacent subsegmental atelectasis. Diverticulosis of descending and sigmoid colon without inflammation. Stable left adrenal adenoma. Aortic Atherosclerosis (ICD10-I70.0). Electronically Signed   By: Lupita Raider M.D.   On: 02/10/2023 11:42   VAS [place] LOWER EXTREMITY VENOUS (DVT) Result Date: 02/09/2023  Lower Venous DVT Study Patient Name:  Felicia Benson  Date of Exam:   02/08/2023 Medical Rec #: [id number]         Accession #:    [id number] Date of Birth: 1930/06/29          Patient Gender: F Patient Age:   37 years  Exam Location:  [facility] Procedure:      VAS [place] LOWER EXTREMITY VENOUS (DVT) Referring Phys: [facility] GOEL --------------------------------------------------------------------------------  Indications: Swelling, and Pain. Other Indications: Right femur fracture after a mechanical fall. Performing Technologist: Marilynne Halsted RDMS, RVT  Examination Guidelines: A complete evaluation includes B-mode imaging, spectral Doppler, color Doppler, and power Doppler as needed of all accessible portions of each vessel. Bilateral testing is considered an integral part of a  complete examination. Limited examinations for reoccurring indications may be performed as noted. The reflux portion of the exam is performed with the patient in reverse Trendelenburg.  +---------+---------------+---------+-----------+----------+--------------+ RIGHT    CompressibilityPhasicitySpontaneityPropertiesThrombus Aging +---------+---------------+---------+-----------+----------+--------------+ CFV      Full           Yes      Yes                                 +---------+---------------+---------+-----------+----------+--------------+ SFJ      Full                                                        +---------+---------------+---------+-----------+----------+--------------+ FV Prox  Full                                                        +---------+---------------+---------+-----------+----------+--------------+ FV Mid   Full                                                        +---------+---------------+---------+-----------+----------+--------------+ FV DistalFull                                                        +---------+---------------+---------+-----------+----------+--------------+ PFV      Full                                                        +---------+---------------+---------+-----------+----------+--------------+ POP      Full           Yes      Yes                                  +---------+---------------+---------+-----------+----------+--------------+ PTV      Full                                                        +---------+---------------+---------+-----------+----------+--------------+ PERO     Full                                                        +---------+---------------+---------+-----------+----------+--------------+   +---------+---------------+---------+-----------+----------+--------------+ LEFT     CompressibilityPhasicitySpontaneityPropertiesThrombus Aging +---------+---------------+---------+-----------+----------+--------------+ CFV      Full  Yes      Yes                                 +---------+---------------+---------+-----------+----------+--------------+ SFJ      Full                                                        +---------+---------------+---------+-----------+----------+--------------+ FV Prox  Full                                                        +---------+---------------+---------+-----------+----------+--------------+ FV Mid   Full                                                        +---------+---------------+---------+-----------+----------+--------------+ FV DistalFull                                                        +---------+---------------+---------+-----------+----------+--------------+ PFV      Full                                                        +---------+---------------+---------+-----------+----------+--------------+ POP      Full           Yes      Yes                                 +---------+---------------+---------+-----------+----------+--------------+ PTV      Full                                                        +---------+---------------+---------+-----------+----------+--------------+ PERO     Partial                                      Acute           +---------+---------------+---------+-----------+----------+--------------+ Acute DVT in one of the paired peroneal veins.    Summary: RIGHT: - There is no evidence of deep vein thrombosis in the lower extremity.  - No cystic structure found in the popliteal fossa.  LEFT: - Findings consistent with acute deep vein thrombosis involving the left peroneal veins.  - No cystic structure found in the popliteal fossa.  *See table(s) above for measurements and observations. Electronically signed by Lemar Livings MD on 02/09/2023 at 4:00:59  PM.    Final    ECHOCARDIOGRAM COMPLETE Result Date: 02/08/2023    ECHOCARDIOGRAM REPORT   Patient Name:   Felicia Benson Date of Exam: 02/08/2023 Medical Rec #:  [id number]        Height:       66.0 in Accession #:    [id number]       Weight:       188.1 lb Date of Birth:  [birth year]         BSA:          1.948 m Patient Age:    89 years         BP:           130/67 mmHg Patient Gender: F                HR:           94 bpm. Exam Location:  Inpatient Procedure: 2D Echo, Color Doppler and Cardiac Doppler Indications:    pulmonary emboli  History:        Patient has prior history of Echocardiogram examinations, most                 recent 07/23/2022. CHF, CAD; Risk Factors:Hypertension and                 Dyslipidemia.  Sonographer:    Melissa Morford RDCS (AE, PE) Referring Phys: [id number] RIPUDEEP K RAI IMPRESSIONS  1. Left ventricular ejection fraction, by estimation, is 50 to 55%. The left ventricle has low normal function. The left ventricle demonstrates regional wall motion abnormalities with septal hypokinesis. There is mild concentric left ventricular hypertrophy. Indeterminate diastolic filling due to E-A fusion.  2. D-shaped septum suggestive of RV pressure/volume overload. Right ventricular systolic function is moderately reduced. The right ventricular size is moderately enlarged. There is moderately elevated pulmonary artery systolic pressure. The estimated right ventricular systolic  pressure is 56.2 mmHg.  3. Left atrial size was mildly dilated.  4. Right atrial size was moderately dilated.  5. The mitral valve is normal in structure. No evidence of mitral valve regurgitation. No evidence of mitral stenosis.  6. The aortic valve is tricuspid. There is mild calcification of the aortic valve. Aortic valve regurgitation is not visualized. No aortic stenosis is present.  7. Aortic dilatation noted. There is mild dilatation of the aortic root, measuring 38 mm. There is mild dilatation of the ascending aorta, measuring 43 mm.  8. The inferior vena cava is normal in size with <50% respiratory variability, suggesting right atrial pressure of 8 mmHg. FINDINGS  Left Ventricle: Left ventricular ejection fraction, by estimation, is 50 to 55%. The left ventricle has low normal function. The left ventricle demonstrates regional wall motion abnormalities. The left ventricular internal cavity size was normal in size. There is mild concentric left ventricular hypertrophy. Indeterminate diastolic filling due to E-A fusion. Right Ventricle: D-shaped septum suggestive of RV pressure/volume overload. The right ventricular size is moderately enlarged. No increase in right ventricular wall thickness. Right ventricular systolic function is moderately reduced. There is moderately  elevated pulmonary artery systolic pressure. The tricuspid regurgitant velocity is 3.47 m/s, and with an assumed right atrial pressure of 8 mmHg, the estimated right ventricular systolic pressure is 56.2 mmHg. Left Atrium: Left atrial size was mildly dilated. Right Atrium: Right atrial size was moderately dilated. Pericardium: There is no evidence of pericardial effusion. Mitral Valve: The mitral valve is normal in structure. There is mild calcification of  the mitral valve leaflet(s). Mild mitral annular calcification. No evidence of mitral valve regurgitation. No evidence of mitral valve stenosis. Tricuspid Valve: The tricuspid valve is  normal in structure. Tricuspid valve regurgitation is mild. Aortic Valve: The aortic valve is tricuspid. There is mild calcification of the aortic valve. Aortic valve regurgitation is not visualized. No aortic stenosis is present. Pulmonic Valve: The pulmonic valve was normal in structure. Pulmonic valve regurgitation is mild to moderate. Aorta: Aortic dilatation noted. There is mild dilatation of the aortic root, measuring 38 mm. There is mild dilatation of the ascending aorta, measuring 43 mm. Venous: The inferior vena cava is normal in size with less than 50% respiratory variability, suggesting right atrial pressure of 8 mmHg. IAS/Shunts: No atrial level shunt detected by color flow Doppler. Additional Comments: A device lead is visualized in the right ventricle.  LEFT VENTRICLE PLAX 2D LVIDd:         4.00 cm LVIDs:         3.30 cm LV PW:         1.10 cm LV IVS:        1.20 cm  RIGHT VENTRICLE RV S prime:     7.72 cm/s TAPSE (M-mode): 2.1 cm LEFT ATRIUM             Index        RIGHT ATRIUM           Index LA diam:        3.70 cm 1.90 cm/m   RA Area:     24.70 cm LA Vol (A2C):   43.4 ml 22.28 ml/m  RA Volume:   80.60 ml  41.37 ml/m LA Vol (A4C):   62.3 ml 31.98 ml/m LA Biplane Vol: 54.6 ml 28.03 ml/m  AORTIC VALVE LVOT Vmax:   106.00 cm/s LVOT Vmean:  76.400 cm/s LVOT VTI:    0.160 m  AORTA Ao Root diam: 3.80 cm Ao Asc diam:  4.30 cm TRICUSPID VALVE TR Peak grad:   48.2 mmHg TR Vmax:        347.00 cm/s  SHUNTS Systemic VTI: 0.16 m Dalton McleanMD Electronically signed by Wilfred Lacy Signature Date/Time: 02/08/2023/12:56:56 PM    Final    DG Swallowing Func-Speech Pathology Result Date: 02/08/2023 Table formatting from the original result was not included. Modified Barium Swallow Study Patient Details Name: Felicia Benson MRN: [id number] Date of Birth: 07-Oct-1930 Today's Date: 02/08/2023 HPI/PMH: HPI: Felicia Benson is a 89 y.o. female with medical history significant of osteoarthritis, Crohn's  disease, GERD, hypertension, abdominal aortic aneurysm . Displaced right intertrochanteric femur fracture after a mechanical fall -underwent ORIF by orthopedics on 10/30/2022.subsequent re-hospitalisation and discharged on January 03, 2023 after she was admitted for complaint of rectal bleed suspected to be diverticular in nature requiring several units of blood transfusion patient tells me 3.  This was managed medically CT angiography did not show active bleed.  Although diverticulosis was seen. Patient was at rehab facility.  Has been complaining of cough with some scant sputum production as well as sensation of shortness of breath, fatigue, poor appetite for the last 7 days or so.  Patient was subsequently noted to have sats of 90% by the facility on her chronic 4 L/min of supplementary oxygen.  ER workup is done with CT angiography.  Small segmental pulmonary artery filling defect in the right lower lobe.  Interstitial and groundglass opacities in the lungs bilaterally possible edema or infiltrate.  Patchy airspace disease in  the lower lobes bilaterally possible atelectasis or interval infiltrate.  Mild bilateral pleural effusions. Clinical Impression: Clinical Impression: Patient presents with a mild, largely age related oropharyngeal dysphagia characterized by mildly reduced base of tongue and laryngeal weakness and age appropriate initiation of the swallow at the level of the vallecula and pyriform sinuses with intermittent trace penetration of thin liquids. Even when challenged with multiple consecutive large boluses, penetrates were minimal and remained above the vocal cords, clearing with subsequent swallows and cued throat clear. Do not feel that this degree of deficit would account for multifocal PNA. Educated patient on benefits of small sips, slow rate, and possible occassional throat clear to decrease aspiration risks. Patient verbalized understanding. No further SLP needs indicated. Factors that may  increase risk of adverse event in presence of aspiration Rubye Oaks & Clearance Coots 2021): No data recorded Recommendations/Plan: Swallowing Evaluation Recommendations Swallowing Evaluation Recommendations Recommendations: PO diet PO Diet Recommendation: Regular; Thin liquids (Level 0) Liquid Administration via: Cup; Straw Medication Administration: Whole meds with liquid Supervision: Patient able to self-feed Swallowing strategies  : Slow rate; Small bites/sips; Clear throat intermittently Postural changes: Position pt fully upright for meals Oral care recommendations: Oral care BID (2x/day) Treatment Plan Treatment Plan Treatment recommendations: No treatment recommended at this time Follow-up recommendations: No SLP follow up Functional status assessment: Patient has not had a recent decline in their functional status. Recommendations Recommendations for follow up therapy are one component of a multi-disciplinary discharge planning process, led by the attending physician.  Recommendations may be updated based on patient status, additional functional criteria and insurance authorization. Assessment: Orofacial Exam: Orofacial Exam Oral Cavity: Oral Hygiene: WFL Oral Cavity - Dentition: Dentures, bottom; Dentures, top Orofacial Anatomy: WFL Oral Motor/Sensory Function: WFL Anatomy: Anatomy: Presence of cervical hardware Boluses Administered: Boluses Administered Boluses Administered: Thin liquids (Level 0); Puree; Solid  Oral Impairment Domain: Oral Impairment Domain Lip Closure: No labial escape Tongue control during bolus hold: Cohesive bolus between tongue to palatal seal Bolus preparation/mastication: Timely and efficient chewing and mashing Bolus transport/lingual motion: Brisk tongue motion Oral residue: Trace residue lining oral structures Location of oral residue : Tongue; Floor of mouth Initiation of pharyngeal swallow : Pyriform sinuses  Pharyngeal Impairment Domain: Pharyngeal Impairment Domain Soft palate  elevation: No bolus between soft palate (SP)/pharyngeal wall (PW) Laryngeal elevation: Partial superior movement of thyroid cartilage/partial approximation of arytenoids to epiglottic petiole Anterior hyoid excursion: Partial anterior movement Epiglottic movement: Complete inversion Laryngeal vestibule closure: Incomplete, narrow column air/contrast in laryngeal vestibule Pharyngeal stripping wave : Present - complete Pharyngeal contraction (A/P view only): N/A Pharyngoesophageal segment opening: Complete distension and complete duration, no obstruction of flow Tongue base retraction: Trace column of contrast or air between tongue base and PPW Pharyngeal residue: Collection of residue within or on pharyngeal structures Location of pharyngeal residue: Valleculae; Pharyngeal wall; Pyriform sinuses; Diffuse (>3 areas)  Esophageal Impairment Domain: Esophageal Impairment Domain Esophageal clearance upright position: Complete clearance, esophageal coating Pill: Pill Consistency administered: Thin liquids (Level 0) Thin liquids (Level 0): Impaired (see clinical impressions) Penetration/Aspiration Scale Score: Penetration/Aspiration Scale Score 1.  Material does not enter airway: Puree; Solid; Pill 3.  Material enters airway, remains ABOVE vocal cords and not ejected out: Thin liquids (Level 0) Compensatory Strategies: Compensatory Strategies Compensatory strategies: No   General Information: Caregiver present: No  Diet Prior to this Study: Regular; Thin liquids (Level 0)   Temperature : Normal   Respiratory Status: WFL   Supplemental O2: Nasal cannula   History of Recent  Intubation: No  Behavior/Cognition: Alert; Cooperative; Pleasant mood Self-Feeding Abilities: Able to self-feed Baseline vocal quality/speech: Normal Volitional Cough: Able to elicit Volitional Swallow: Able to elicit No data recorded Goal Planning: No data recorded No data recorded No data recorded No data recorded Consulted and agree with results and  recommendations: Patient Pain: Pain Assessment Pain Assessment: No/denies pain End of Session: Start Time:SLP Start Time (ACUTE ONLY): 1030 Stop Time: SLP Stop Time (ACUTE ONLY): 1055 Time Calculation:SLP Time Calculation (min) (ACUTE ONLY): 25 min Charges: SLP Evaluations $ SLP Speech Visit: 1 Visit SLP Evaluations $BSS Swallow: 1 Procedure $MBS Swallow: 1 Procedure SLP visit diagnosis: SLP Visit Diagnosis: Dysphagia, oropharyngeal phase (R13.12) Past Medical History: Past Medical History: Diagnosis Date  AAA (abdominal aortic aneurysm) (HCC)   CHF (congestive heart failure) (HCC)   Crohn's disease (HCC)   Diverticulitis   Diverticulosis   GERD (gastroesophageal reflux disease)   GI bleed   Hypertension   OSA (obstructive sleep apnea)   Pacemaker   Thyroid nodule  Past Surgical History: Past Surgical History: Procedure Laterality Date  CHOLECYSTECTOMY    COLONOSCOPY WITH PROPOFOL N/A 06/01/2015  Procedure: COLONOSCOPY WITH PROPOFOL;  Surgeon: Charlott Rakes, MD;  Location: [facility] ENDOSCOPY;  Service: Endoscopy;  Laterality: N/A;  CORONARY ANGIOPLASTY WITH STENT PLACEMENT    ESOPHAGOGASTRODUODENOSCOPY (EGD) WITH PROPOFOL N/A 06/01/2015  Procedure: ESOPHAGOGASTRODUODENOSCOPY (EGD) WITH PROPOFOL;  Surgeon: Charlott Rakes, MD;  Location: [facility] ENDOSCOPY;  Service: Endoscopy;  Laterality: N/A;  GIVENS CAPSULE STUDY N/A 06/03/2015  Procedure: GIVENS CAPSULE STUDY;  Surgeon: Charlott Rakes, MD;  Location: [facility] ENDOSCOPY;  Service: Endoscopy;  Laterality: N/A;  INTRAMEDULLARY (IM) NAIL INTERTROCHANTERIC Right 10/30/2022  Procedure: INTRAMEDULLARY nailing of right femur;  Surgeon: Joen Laura, MD;  Location: [facility] OR;  Service: Orthopedics;  Laterality: Right;  PACEMAKER PLACEMENT  [address] MA, CCC-SLP McCoy Leah Meryl 02/08/2023, 11:18 AM  CT Angio Chest PE W and/or Wo Contrast Addendum Date: 02/07/2023 ADDENDUM REPORT: 02/07/2023 20:08 ADDENDUM: Critical Value/emergent results were called by telephone at the  time of interpretation on 02/07/2023 at 8:07 pm to provider Dr. Joneen Caraway , who verbally acknowledged these results. Electronically Signed   By: Thornell Sartorius M.D.   On: 02/07/2023 20:08   Result Date: 02/07/2023 CLINICAL DATA:  Pulmonary embolism suspected, high probability. EXAM: CT ANGIOGRAPHY CHEST WITH CONTRAST TECHNIQUE: Multidetector CT imaging of the chest was performed using the standard protocol during bolus administration of intravenous contrast. Multiplanar CT image reconstructions and MIPs were obtained to evaluate the vascular anatomy. RADIATION DOSE REDUCTION: This exam was performed according to the departmental dose-optimization program which includes automated exposure control, adjustment of the mA and/or kV according to patient size and/or use of iterative reconstruction technique. CONTRAST:  75mL OMNIPAQUE IOHEXOL 350 MG/ML SOLN COMPARISON:  07/19/2022. FINDINGS: Cardiovascular: Heart is enlarged and there is no pericardial effusion. Pacemaker leads are noted in the heart. Multi-vessel coronary artery calcifications are present. There is atherosclerotic calcification of the aorta with aneurysmal dilatation of the ascending aorta measuring 4.3 cm. The pulmonary trunk is distended suggesting underlying pulmonary artery hypertension. There is a suspected pulmonary artery filling defect in a segmental pulmonary artery in the right lower lobe. The right ventricle is distended, similar in appearance to the prior exam. Mediastinum/Nodes: Enlarged lymph nodes are present in the mediastinum measuring up to 1.4 cm in the AP window. Enlarged hilar lymph nodes are present bilaterally. No axillary lymphadenopathy. The thyroid gland is enlarged with heterogeneous attenuation. Surgical clips are noted in the region  of the left lobe of the thyroid gland. The trachea and esophagus are within normal limits. There is a small hiatal hernia. Lungs/Pleura: Ground-glass opacities and interstitial prominence are  present in the lungs bilaterally. There is patchy atelectasis or infiltrate in the lower lobes bilaterally. Small bilateral pleural effusions are noted, greater on the right than on the left. No pneumothorax is seen. Upper Abdomen: The gallbladder is surgically absent. Scattered diverticula are noted along the colon without diverticulitis. No acute abnormality. Musculoskeletal: A pacemaker device is noted in the left chest wall. Degenerative changes are noted in the thoracic spine. No acute osseous abnormality is seen. Review of the MIP images confirms the above findings. IMPRESSION: 1. Small segmental pulmonary artery filling defect in the right lower lobe. The right ventricle is distended, similar in appearance to prior exams which may reflect right heart failure. The possibility of superimposed right heart strain can not be excluded. 2. Interstitial and ground-glass opacities in the lungs bilaterally, possible edema or infiltrate. 3. Patchy airspace disease in the lower lobes bilaterally, possible atelectasis or infiltrate. 4. Small bilateral pleural effusions. 5. Coronary artery calcifications. 6. Distended pulmonary trunk suggesting underlying pulmonary artery hypertension. 7. Aortic atherosclerosis with aneurysmal dilatation of the ascending aorta measuring 4.3 cm. Recommend annual imaging followup by CTA or MRA. This recommendation follows 2010 ACCF/AHA/AATS/ACR/ASA/SCA/SCAI/SIR/STS/SVM Guidelines for the Diagnosis and Management of Patients with Thoracic Aortic Disease. Circulation. 2010; 121: I696-E952. Aortic aneurysm NOS (ICD10-I71.9) Electronically Signed: By: Thornell Sartorius M.D. On: 02/07/2023 20:02   DG Chest Port 1 View Result Date: 02/07/2023 CLINICAL DATA:  Lethargy and weakness. EXAM: PORTABLE CHEST 1 VIEW COMPARISON:  Chest radiograph dated November 09, 2022. FINDINGS: Stable cardiomediastinal silhouette. Stable dual lead left-sided pacemaker in place. Aortic atherosclerosis. Streaky bibasilar  opacities. Similar blunting of the left costophrenic angle. No pneumothorax. Partially visualized postsurgical changes of the cervical spine. Surgical staples along the left inferomedial neck. No acute osseous abnormality. IMPRESSION: 1. Streaky bibasilar opacities could reflect atelectasis, edema, or infiltrate. 2. Similar blunting of the left costophrenic angle may reflect a trace pleural effusion versus overlying soft tissue. Electronically Signed   By: Hart Robinsons M.D.   On: 02/07/2023 15:23    Labs:  CBC: Recent Labs    02/07/23 2155 02/08/23 0509 02/09/23 0416 02/10/23 0615  WBC 4.6 5.1 5.1 5.6  HGB 8.3* 8.5* 8.2* 7.9*  HCT 28.0* 29.0* 26.9* 26.2*  PLT 293 280 286 261    COAGS: Recent Labs    10/29/22 0254 02/08/23 0509  INR 1.1 1.3*  APTT  --  30    BMP: Recent Labs    02/07/23 1635 02/07/23 1645 02/08/23 0509 02/09/23 0416 02/10/23 0615  NA 137 138 135 139 137  K 4.3 4.2 4.9 4.0 3.5  CL 100  --  98 101 101  CO2 26  --  27 29 28   GLUCOSE 92  --  76 89 97  BUN 26*  --  24* 26* 26*  CALCIUM 7.8*  --  7.6* 7.6* 7.6*  CREATININE 1.04*  --  1.09* 1.17* 1.27*  GFRNONAA 51*  --  48* 44* 40*    LIVER FUNCTION TESTS: Recent Labs    11/07/22 0854 11/13/22 0249 12/28/22 1617 02/07/23 1635  BILITOT 1.2 1.0 0.8 0.5  AST 23 23 17 18   ALT 13 12 9 8   ALKPHOS 80 82 75 70  PROT 5.5* 4.6* 6.0* 5.3*  ALBUMIN 1.9* 1.7* 2.6* 2.1*    TUMOR MARKERS: No results for  input(s): "AFPTM", "CEA", "CA199", "CHROMGRNA" in the last 8760 hours.  Assessment and Plan: 89 y.o. female with worsening anemia while in heparin infusion due to segmental PE and acute left peroneal DVT who presents for IVF placement.   VSS CBC stable but hgb trending down  RF BUN 26, creatinine 1.27, GFR 40 - will most likely use CO2 Allergies reviewed   Risks and benefits discussed with the patient including, but not limited to bleeding, infection, contrast induced renal failure, filter  fracture or migration which can lead to emergency surgery or even death, strut penetration with damage or irritation to adjacent structures and caval thrombosis.  All of the patient's questions were answered, patient is agreeable to proceed. Consent signed and in chart.  The procedure is tentatively scheduled for tomorrow pending IR.  PLAN - NPO except meds at [facility]  - The procedure can be performed with local anesthetic only, patient was informed and open to the option. - Patient case will be discussed during the IR morning huddle in AM, will infomr RN if pt needs to remain NPO or not.   Thank you for this interesting consult.  I greatly enjoyed meeting Felicia Benson and look forward to participating in their care.  A copy of this report was sent to the requesting provider on this date.  Electronically Signed: Willette Brace, PA-C 02/10/2023, 12:41 PM   I spent a total of 40 Minutes    in face to face in clinical consultation, greater than 50% of which was counseling/coordinating care for IVC filter placement.   This chart was dictated using voice recognition software.  Despite best efforts to proofread,  errors can occur which can change the documentation meaning.

## 2023-02-11 ENCOUNTER — Inpatient Hospital Stay (HOSPITAL_COMMUNITY): Payer: Medicare Other

## 2023-02-11 DIAGNOSIS — R0602 Shortness of breath: Secondary | ICD-10-CM

## 2023-02-11 DIAGNOSIS — I2699 Other pulmonary embolism without acute cor pulmonale: Secondary | ICD-10-CM | POA: Diagnosis not present

## 2023-02-11 DIAGNOSIS — J189 Pneumonia, unspecified organism: Secondary | ICD-10-CM | POA: Diagnosis not present

## 2023-02-11 DIAGNOSIS — I5033 Acute on chronic diastolic (congestive) heart failure: Secondary | ICD-10-CM

## 2023-02-11 DIAGNOSIS — I2510 Atherosclerotic heart disease of native coronary artery without angina pectoris: Secondary | ICD-10-CM | POA: Diagnosis not present

## 2023-02-11 DIAGNOSIS — Z9861 Coronary angioplasty status: Secondary | ICD-10-CM | POA: Diagnosis not present

## 2023-02-11 DIAGNOSIS — J9601 Acute respiratory failure with hypoxia: Secondary | ICD-10-CM | POA: Diagnosis not present

## 2023-02-11 DIAGNOSIS — I82409 Acute embolism and thrombosis of unspecified deep veins of unspecified lower extremity: Secondary | ICD-10-CM | POA: Diagnosis present

## 2023-02-11 HISTORY — PX: IR IVC FILTER PLMT / S&I /IMG GUID/MOD SED: IMG701

## 2023-02-11 LAB — BASIC METABOLIC PANEL
Anion gap: 7 (ref 5–15)
BUN: 25 mg/dL — ABNORMAL HIGH (ref 8–23)
CO2: 30 mmol/L (ref 22–32)
Calcium: 7.7 mg/dL — ABNORMAL LOW (ref 8.9–10.3)
Chloride: 101 mmol/L (ref 98–111)
Creatinine, Ser: 1.45 mg/dL — ABNORMAL HIGH (ref 0.44–1.00)
GFR, Estimated: 34 mL/min — ABNORMAL LOW (ref >60)
Glucose, Bld: 105 mg/dL — ABNORMAL HIGH (ref 70–99)
Potassium: 3.3 mmol/L — ABNORMAL LOW (ref 3.5–5.1)
Sodium: 138 mmol/L (ref 135–145)

## 2023-02-11 LAB — CBC
HCT: 25 % — ABNORMAL LOW (ref 36.0–46.0)
Hemoglobin: 7.6 g/dL — ABNORMAL LOW (ref 12.0–15.0)
MCH: 29.2 pg (ref 26.0–34.0)
MCHC: 30.4 g/dL (ref 30.0–36.0)
MCV: 96.2 fL (ref 80.0–100.0)
Platelets: 241 10*3/uL (ref 150–400)
RBC: 2.6 MIL/uL — ABNORMAL LOW (ref 3.87–5.11)
RDW: 18.2 % — ABNORMAL HIGH (ref 11.5–15.5)
WBC: 5.1 10*3/uL (ref 4.0–10.5)
nRBC: 0 % (ref 0.0–0.2)

## 2023-02-11 LAB — PREPARE RBC (CROSSMATCH)

## 2023-02-11 MED ORDER — LIDOCAINE HCL 1 % IJ SOLN
20.0000 mL | Freq: Once | INTRAMUSCULAR | Status: AC
  Administered 2023-02-11: 20 mL
  Filled 2023-02-11: qty 20

## 2023-02-11 MED ORDER — IOHEXOL 300 MG/ML  SOLN
100.0000 mL | Freq: Once | INTRAMUSCULAR | Status: AC | PRN
  Administered 2023-02-11: 20 mL via INTRAVENOUS

## 2023-02-11 MED ORDER — GERHARDT'S BUTT CREAM
TOPICAL_CREAM | Freq: Two times a day (BID) | CUTANEOUS | Status: DC
  Filled 2023-02-11 (×2): qty 60

## 2023-02-11 MED ORDER — LIDOCAINE-EPINEPHRINE 1 %-1:100000 IJ SOLN
INTRAMUSCULAR | Status: AC
Start: 2023-02-11 — End: ?
  Filled 2023-02-11: qty 1

## 2023-02-11 MED ORDER — LOPERAMIDE HCL 2 MG PO CAPS
2.0000 mg | ORAL_CAPSULE | Freq: Once | ORAL | Status: AC
  Administered 2023-02-11: 2 mg via ORAL
  Filled 2023-02-11: qty 1

## 2023-02-11 MED ORDER — IOHEXOL 300 MG/ML  SOLN
50.0000 mL | Freq: Once | INTRAMUSCULAR | Status: AC | PRN
  Administered 2023-02-11: 15 mL via INTRAVENOUS

## 2023-02-11 MED ORDER — POTASSIUM CHLORIDE CRYS ER 20 MEQ PO TBCR
40.0000 meq | EXTENDED_RELEASE_TABLET | Freq: Once | ORAL | Status: AC
  Administered 2023-02-11: 40 meq via ORAL
  Filled 2023-02-11: qty 2

## 2023-02-11 MED ORDER — AMOXICILLIN-POT CLAVULANATE 500-125 MG PO TABS
1.0000 | ORAL_TABLET | Freq: Two times a day (BID) | ORAL | Status: DC
  Administered 2023-02-11 (×2): 1 via ORAL
  Filled 2023-02-11 (×2): qty 1

## 2023-02-11 MED ORDER — LOPERAMIDE HCL 2 MG PO CAPS
2.0000 mg | ORAL_CAPSULE | Freq: Three times a day (TID) | ORAL | Status: DC | PRN
  Administered 2023-02-13 – 2023-02-14 (×2): 2 mg via ORAL
  Filled 2023-02-11 (×2): qty 1

## 2023-02-11 MED ORDER — SODIUM CHLORIDE 0.9% IV SOLUTION: Freq: Once | INTRAVENOUS | Status: AC

## 2023-02-11 NOTE — Progress Notes (Signed)
Taken to IR with transport. Report given to receiving staff in IR.

## 2023-02-11 NOTE — Procedures (Signed)
Vascular and Interventional Radiology Procedure Note  Patient: Felicia Benson DOB: 1930-01-25 Medical Record Number: [id number] Note Date/Time: 02/11/23 3:53 PM   Performing Physician: Roanna Banning, MD Assistant(s): None  Diagnosis: GIB w LLE DVT. Unable to anticoagulate  Procedure:   CENTRAL VENOGRAM INFERIOR VENA CAVA FILTER PLACEMENT   Anesthesia: Local Anesthetic Complications: None Estimated Blood Loss: Minimal Specimens: None  Findings:  - access via the RIGHT internal jugular vein. - successful placement of infrarenal IVC filter. - No obvious abnormality was identified at this time.  Plan: *IVC filters can cause complications when left in place for extended periods of time. If medically appropriate, recommend discontinuing filter prior to discharge.  *Please re-evaluate the patient for filter discontinuation when they are seen in follow up, and refer patient to Interventional Radiology for removal.   Final report to follow once all images are reviewed and compared with previous studies.  See detailed dictation with images in PACS. The patient tolerated the procedure well without incident or complication and was returned to Recovery in stable condition.    Roanna Banning, MD [facility]   Pager. [phone] Clinic. [phone]

## 2023-02-11 NOTE — Progress Notes (Signed)
[facility]                                                                              Felicia Benson, is a 89 y.o. female, DOB - February 14, 1930, ZOX:[id number] Admit date - 02/07/2023    Outpatient Primary MD for the patient is [name], MD  LOS - 4  days  Chief Complaint  Patient presents with   Pneumonia       Brief summary   Patient is a 89 year old female with osteoarthritis, Crohn's disease, GERD, HTN, AAA, admitted in 10/2022 with right hip fracture after mechanical fall and underwent ORIF, hospitalization in 12/2022 for rectal bleeding suspected to be diverticular bleeding 3 RBC transfusions.  CT angiography did not show active bleed.  Patient presented from SNF with cough, shortness of breath, fatigue, poor appetite for a week.  She was noted to have O2 sats of 90% by the facility on her chronic 4 L O2.  Denied any fevers, leg swelling, chest pain or palpitations. CTA chest showed small segmental pulmonary artery filling defect in the right lower lobe, interstitial and groundglass opacities in the lungs bilaterally, possible edema or infiltrate.  Patchy airspace disease in the lower lobes bilaterally.  Mild bilateral pleural effusions.   Assessment & Plan    Principal Problem:  CAP (community acquired pneumonia), multifocal Acute respiratory failure with hypoxia -Flu, COVID, RSV negative -Mild elevated troponin 23-23, likely demand ischemia -Complaining of diarrhea with ampicillin, discontinued IV Unasyn.  Transition to Augmentin for 2 more days to complete full course -MBS done, mild age-related oropharyngeal dysphagia -Continue Tessalon Perles, Mucinex, Hycodan for cough -Incentive spirometry  Volume overload with bilateral pleural effusions, mild acute on chronic diastolic CHF Right ventricular dysfunction (new from previous echo 07/23/2022) -2D echo 07/2022 had shown EF 60 to 65%, G1 DD -BNP 829.2, CTA with bilateral pleural effusions -Received  extra Lasix 20 mg IV x 1 on 1/31, continue Lasix 40 mg daily -2D echo showed EF of 50 to 55%, septal hypokinesis, indeterminate diastolic filling.  D-shaped septum suggestive of RV pressure/volume overload, moderately reduced right ventricular systolic function.  Right ventricular size moderately large, moderately elevated PA systolic pressure. -Right ventricular dysfunction appears to be new from previous echo on 07/23/2022 -Cardiology following, on Lasix 40 mg daily, creatinine 1.45, holding Lasix.    Acute pulmonary embolism (HCC), subsegmental PE. -CTA chest showed small subsegmental pulmonary artery filling defect of the right lower lobe.  Right ventricular distended, may reflect right heart strain, interstitial and groundglass opacities in the lungs bilaterally possible edema or infiltrate.  Patchy airspace disease in the lower lobes. -I discussed with radiology, Dr. Llana Aliment who reviewed the CTA and confirmed that patient indeed has small subsegmental PE.  Appreciate pulmonology recommendations -  Venous Doppler showed acute DVT involving the left peroneal veins -Patient was placed on IV heparin drip, hemoglobin on admission 9.2, trended down however no obvious bleeding.  Heparin drip was discontinued on 2/2. -Hemoglobin 7.6.  FOBT negative, CT abdomen negative for any retroperitoneal bleed.  --Given high risk of bleeding and recent history of diverticular bleed in 10/2022, IR consulted for consideration of  IVC filter.  Discussed in detail with the patient and her daughter who is agreeable with the plan.  -IVC filter today   History of Crohn's disease, recent diverticular bleed in 12/2022, GERD -Continue Protonix -Not in flare, continue Pentasa   CAD status post PCI, HLP -Currently no acute chest pain, continue aspirin, statin, metoprolol  Chronic kidney disease stage III a -Creatinine currently at baseline  Diarrhea -States yesterday had multiple episodes of diarrhea, feels worse  due to ampicillin -IV Unasyn discontinued, no fevers, abdominal pain, leukocytosis, low suspicion for C. Difficile.  Patient was on Colace before -Placed on loperamide as needed    Obesity class II Estimated body mass index is 30.35 kg/m as calculated from the following:   Height as of this encounter: 5\' 6"  (1.676 m).   Weight as of this encounter: 85.3 kg.  Code Status: DNR DVT Prophylaxis:  SCDs Start: 02/07/23 2215   Level of Care: Level of care: Telemetry Medical Family Communication:   Updated patient's daughter on phone on 2/2  Disposition Plan:      Remains inpatient appropriate:      Procedures:    Consultants:   Pulmonology IR Cardiology   Antimicrobials:   Anti-infectives (From admission, onward)    Start     Dose/Rate Route Frequency Ordered Stop   02/11/23 1000  amoxicillin-clavulanate (AUGMENTIN) 500-125 MG per tablet 1 tablet        1 tablet Oral Every 12 hours 02/11/23 0733 02/13/23 0959   02/08/23 1000  azithromycin (ZITHROMAX) 500 mg in sodium chloride 0.9 % 250 mL IVPB        500 mg 250 mL/hr over 60 Minutes Intravenous Every 24 hours 02/07/23 2158 02/09/23 2019   02/07/23 2300  Ampicillin-Sulbactam (UNASYN) 3 g in sodium chloride 0.9 % 100 mL IVPB  Status:  Discontinued        3 g 200 mL/hr over 30 Minutes Intravenous Every 6 hours 02/07/23 2229 02/11/23 0732   02/07/23 1600  cefTRIAXone (ROCEPHIN) 1 g in sodium chloride 0.9 % 100 mL IVPB  Status:  Discontinued        1 g 200 mL/hr over 30 Minutes Intravenous  Once 02/07/23 1548 02/07/23 2229   02/07/23 1600  azithromycin (ZITHROMAX) 500 mg in sodium chloride 0.9 % 250 mL IVPB        500 mg 250 mL/hr over 60 Minutes Intravenous  Once 02/07/23 1548 02/07/23 2047          Medications  amoxicillin-clavulanate  1 tablet Oral Q12H   atorvastatin  40 mg Oral QHS   benzonatate  200 mg Oral TID   dextromethorphan  15 mg Oral BID   feeding supplement  237 mL Oral BID BM   guaiFENesin  600 mg Oral  BID   melatonin  3 mg Oral QHS   mesalamine  1,000 mg Oral BID   metoprolol succinate  25 mg Oral Daily   mometasone-formoterol  2 puff Inhalation BID   montelukast  10 mg Oral Daily   pantoprazole  40 mg Oral BID   sodium chloride flush  3 mL Intravenous Q12H      Subjective:   Felicia Benson was seen and examined today.  States yesterday had multiple episodes of diarrhea, was on Colace.  Still has some cough and congestion.  No nausea vomiting, abdominal pain.  States BM did not have any bleeding.   Objective:   Vitals:   02/11/23 0746 02/11/23 0814 02/11/23 1108 02/11/23 1125  BP:  110/61 112/68 100/64  Pulse:  88 80   Resp:  18 20 15   Temp:   98 F (36.7 C) (!) 97.5 F (36.4 C)  TempSrc:   Oral Oral  SpO2: 94% 94% 93% 93%  Weight:      Height:        Intake/Output Summary (Last 24 hours) at 02/11/2023 1206 Last data filed at 02/11/2023 0800 Gross per 24 hour  Intake 100 ml  Output 600 ml  Net -500 ml     Wt Readings from Last 3 Encounters:  02/07/23 85.3 kg  12/28/22 85.3 kg  11/13/22 97.6 kg    Physical Exam General: Alert and oriented x 3, NAD Cardiovascular: S1 S2 clear, RRR.  Respiratory: CTAB Gastrointestinal: Soft, nontender, nondistended, NBS Ext: no pedal edema bilaterally Neuro: no new deficits Psych: Normal affect    Data Reviewed:  I have personally reviewed following labs    CBC Lab Results  Component Value Date   WBC 5.1 02/11/2023   RBC 2.60 (L) 02/11/2023   HGB 7.6 (L) 02/11/2023   HCT 25.0 (L) 02/11/2023   MCV 96.2 02/11/2023   MCH 29.2 02/11/2023   PLT 241 02/11/2023   MCHC 30.4 02/11/2023   RDW 18.2 (H) 02/11/2023   LYMPHSABS 1.7 12/30/2022   MONOABS 1.2 (H) 12/30/2022   EOSABS 0.2 12/30/2022   BASOSABS 0.0 12/30/2022     Last metabolic panel Lab Results  Component Value Date   NA 138 02/11/2023   K 3.3 (L) 02/11/2023   CL 101 02/11/2023   CO2 30 02/11/2023   BUN 25 (H) 02/11/2023   CREATININE 1.45 (H)  02/11/2023   GLUCOSE 105 (H) 02/11/2023   GFRNONAA 34 (L) 02/11/2023   GFRAA 44 (L) 05/21/2019   CALCIUM 7.7 (L) 02/11/2023   PHOS 3.2 05/21/2019   PROT 5.3 (L) 02/07/2023   ALBUMIN 2.1 (L) 02/07/2023   BILITOT 0.5 02/07/2023   ALKPHOS 70 02/07/2023   AST 18 02/07/2023   ALT 8 02/07/2023   ANIONGAP 7 02/11/2023    CBG (last 3)  No results for input(s): "GLUCAP" in the last 72 hours.    Coagulation Profile: Recent Labs  Lab 02/08/23 0509  INR 1.3*     Radiology Studies: I have personally reviewed the imaging studies  CT ABDOMEN PELVIS WO CONTRAST Result Date: 02/10/2023 CLINICAL DATA:  Acute generalized abdominal pain. EXAM: CT ABDOMEN AND PELVIS WITHOUT CONTRAST TECHNIQUE: Multidetector CT imaging of the abdomen and pelvis was performed following the standard protocol without IV contrast. RADIATION DOSE REDUCTION: This exam was performed according to the departmental dose-optimization program which includes automated exposure control, adjustment of the mA and/or kV according to patient size and/or use of iterative reconstruction technique. COMPARISON:  December 28, 2022. FINDINGS: Lower chest: Small bilateral pleural effusions are noted with adjacent subsegmental atelectasis, right greater than left. Hepatobiliary: No focal liver abnormality is seen. Status post cholecystectomy. No biliary dilatation. Pancreas: Unremarkable. No pancreatic ductal dilatation or surrounding inflammatory changes. Spleen: Calcified splenic granulomas are noted. Adrenals/Urinary Tract: Stable left adrenal adenoma. Right adrenal gland appears normal. No hydronephrosis or renal obstruction is noted. Urinary bladder is unremarkable. Stomach/Bowel: Stomach is unremarkable. There is no evidence of bowel obstruction. Diverticulosis of descending and sigmoid colon is noted without inflammation. Moderate amount of stool seen in the rectum. The appendix is not clearly visualized. Vascular/Lymphatic: Aortic  atherosclerosis. No enlarged abdominal or pelvic lymph nodes. Reproductive: Status post hysterectomy. No adnexal masses. Other: No abdominal wall  hernia or abnormality. No abdominopelvic ascites. Musculoskeletal: No acute or significant osseous findings. IMPRESSION: Small bilateral pleural effusions with adjacent subsegmental atelectasis. Diverticulosis of descending and sigmoid colon without inflammation. Stable left adrenal adenoma. Aortic Atherosclerosis (ICD10-I70.0). Electronically Signed   By: Lupita Raider M.D.   On: 02/10/2023 11:42       Kyrel Leighton M.D. [facility] 02/11/2023, 12:06 PM  Available via Epic secure chat 7am-7pm After 7 pm, please refer to night coverage provider listed on amion.

## 2023-02-11 NOTE — Progress Notes (Signed)
PHARMACY NOTE:  ANTIMICROBIAL RENAL DOSAGE ADJUSTMENT  Current antimicrobial regimen includes a mismatch between antimicrobial dosage and estimated renal function.  As per policy approved by the Pharmacy & Therapeutics and Medical Executive Committees, the antimicrobial dosage will be adjusted accordingly.  Current antimicrobial dosage:  Augmentin 875/125 mg BID  Indication: CAP  Renal Function:  Estimated Creatinine Clearance: 28.4 mL/min (A) (by C-G formula based on SCr of 1.45 mg/dL (H)). []      On intermittent HD, scheduled: []      On CRRT    Antimicrobial dosage has been changed to:  Augmentin 500/125 mg BID    Thank you for allowing pharmacy to be a part of this patient's care.  Vernard Gambles, PharmD, BCPS  02/11/2023 7:40 AM

## 2023-02-11 NOTE — Progress Notes (Signed)
Patient Name: Felicia Benson Date of Encounter: 02/11/2023 [facility] Cardiologist: None   Interval Summary  .    Feeling well.  Confused about where the bleeding is coming from.  Vital Signs .    Vitals:   02/10/23 0745 02/10/23 2027 02/11/23 0542 02/11/23 0814  BP: 116/67 116/71 106/66 110/61  Pulse: 77 85 85 88  Resp: 18 18  18   Temp:  98 F (36.7 C) (!) 97.5 F (36.4 C)   TempSrc:  Oral Oral   SpO2: 94% 95% 92% 94%  Weight:      Height:        Intake/Output Summary (Last 24 hours) at 02/11/2023 0948 Last data filed at 02/11/2023 0631 Gross per 24 hour  Intake --  Output 600 ml  Net -600 ml      02/07/2023   10:00 PM 12/28/2022    3:33 PM 11/13/2022    5:00 AM  Last 3 Weights  Weight (lbs) 188 lb 0.8 oz 188 lb 215 lb 2.7 oz  Weight (kg) 85.3 kg 85.276 kg 97.6 kg      Telemetry/ECG    Telemetry- VP.  5 beats NSVT.  - Personally Reviewed  Echo 02/08/23:   1. Left ventricular ejection fraction, by estimation, is 50 to 55%. The  left ventricle has low normal function. The left ventricle demonstrates  regional wall motion abnormalities with septal hypokinesis. There is mild  concentric left ventricular  hypertrophy. Indeterminate diastolic filling due to E-A fusion.   2. D-shaped septum suggestive of RV pressure/volume overload. Right  ventricular systolic function is moderately reduced. The right ventricular  size is moderately enlarged. There is moderately elevated pulmonary artery  systolic pressure. The estimated  right ventricular systolic pressure is 56.2 mmHg.   3. Left atrial size was mildly dilated.   4. Right atrial size was moderately dilated.   5. The mitral valve is normal in structure. No evidence of mitral valve  regurgitation. No evidence of mitral stenosis.   6. The aortic valve is tricuspid. There is mild calcification of the  aortic valve. Aortic valve regurgitation is not visualized. No aortic  stenosis is present.   7. Aortic  dilatation noted. There is mild dilatation of the aortic root,  measuring 38 mm. There is mild dilatation of the ascending aorta,  measuring 43 mm.   8. The inferior vena cava is normal in size with <50% respiratory  variability, suggesting right atrial pressure of 8 mmHg.   Chest CT-A 01/11/23: IMPRESSION: 1. Small segmental pulmonary artery filling defect in the right lower lobe. The right ventricle is distended, similar in appearance to prior exams which may reflect right heart failure. The possibility of superimposed right heart strain can not be excluded. 2. Interstitial and ground-glass opacities in the lungs bilaterally, possible edema or infiltrate. 3. Patchy airspace disease in the lower lobes bilaterally, possible atelectasis or infiltrate. 4. Small bilateral pleural effusions. 5. Coronary artery calcifications. 6. Distended pulmonary trunk suggesting underlying pulmonary artery hypertension. 7. Aortic atherosclerosis with aneurysmal dilatation of the ascending aorta measuring 4.3 cm.   Physical Exam .    VS:  BP 110/61 (BP Location: Left Arm)   Pulse 88   Temp (!) 97.5 F (36.4 C) (Oral)   Resp 18   Ht 5\' 6"  (1.676 m)   Wt 85.3 kg   SpO2 94%   BMI 30.35 kg/m  , BMI Body mass index is 30.35 kg/m. GENERAL:  Well appearing HEENT: Pupils equal  round and reactive, fundi not visualized, oral mucosa unremarkable NECK:  No jugular venous distention, waveform within normal limits, carotid upstroke brisk and symmetric, no bruits, no thyromegaly LUNGS:  Clear to auscultation bilaterally HEART:  RRR.  PMI not displaced or sustained,S1 and S2 within normal limits, no S3, no S4, no clicks, no rubs, III/VI systolic murmur at the LUSB ABD:  Flat, positive bowel sounds normal in frequency in pitch, no bruits, no rebound, no guarding, no midline pulsatile mass, no hepatomegaly, no splenomegaly EXT:  2 plus pulses throughout, no edema, no cyanosis no clubbing SKIN:  No rashes no  nodules NEURO:  Cranial nerves II through XII grossly intact, motor grossly intact throughout PSYCH:  Cognitively intact, oriented to person place and time   Assessment & Plan .     68F with Crohn's disease, recent GI bleed 12/2022, ascending aortic aneurysm, hypertension, COPD, CKD 3A, CAD status post PCI, status post PPM, and hyperlipidemia admitted with multifocal pneumonia and pulmonary embolism.  Cardiology consulted for acute diastolic heart failure.  # Acute on chronic diastolic HF:  Currently receiving oral Lasix.  Renal function continues to worsen.  Creatinine today is 1.45.  SHe appears to be euvolemic.  Will hold oral lasix.   # Acute PE: Small subsegmental pulmonary embolism in the right lower lobe this admission.  Complicated by recent GI bleed and history of Crohn's disease.  Right ventricle moderately enlarged and moderately elevated pulmonary pressures.  She is on IV heparin and H/H continues to downtrend.  Planning for IVC filter placement.  Preparing for transfusion given that her hemoglobin today is 7.6.  # CAD s/p RCA PCI:  # Hyperlipidemia: Continue atorvastatin and metoprolol.  # Multifocal pneumonia: Per primary team.    For questions or updates, please contact [facility] Please consult [email or web address] for contact info under        Signed, Chilton Si, MD

## 2023-02-11 NOTE — Care Management Important Message (Signed)
Important Message  Patient Details  Name: Felicia Benson MRN: [id number] Date of Birth: 10-Sep-1930   Important Message Given:  Yes - Medicare IM     Dorena Bodo 02/11/2023, 3:00 PM

## 2023-02-11 NOTE — Plan of Care (Signed)
  Problem: Health Behavior/Discharge Planning: Goal: Ability to manage health-related needs will improve Outcome: Progressing   Problem: Clinical Measurements: Goal: Ability to maintain clinical measurements within normal limits will improve Outcome: Progressing Goal: Will remain free from infection Outcome: Progressing Goal: Diagnostic test results will improve Outcome: Progressing Goal: Respiratory complications will improve Outcome: Progressing Goal: Cardiovascular complication will be avoided Outcome: Progressing   Problem: Nutrition: Goal: Adequate nutrition will be maintained Outcome: Progressing   Problem: Activity: Goal: Risk for activity intolerance will decrease Outcome: Progressing   Problem: Coping: Goal: Level of anxiety will decrease Outcome: Progressing

## 2023-02-12 DIAGNOSIS — I82402 Acute embolism and thrombosis of unspecified deep veins of left lower extremity: Secondary | ICD-10-CM

## 2023-02-12 DIAGNOSIS — R0602 Shortness of breath: Secondary | ICD-10-CM | POA: Diagnosis not present

## 2023-02-12 DIAGNOSIS — I5033 Acute on chronic diastolic (congestive) heart failure: Secondary | ICD-10-CM | POA: Diagnosis not present

## 2023-02-12 DIAGNOSIS — J189 Pneumonia, unspecified organism: Secondary | ICD-10-CM | POA: Diagnosis not present

## 2023-02-12 DIAGNOSIS — N1831 Chronic kidney disease, stage 3a: Secondary | ICD-10-CM | POA: Diagnosis not present

## 2023-02-12 DIAGNOSIS — I2699 Other pulmonary embolism without acute cor pulmonale: Secondary | ICD-10-CM | POA: Diagnosis not present

## 2023-02-12 DIAGNOSIS — Z9861 Coronary angioplasty status: Secondary | ICD-10-CM | POA: Diagnosis not present

## 2023-02-12 DIAGNOSIS — I2510 Atherosclerotic heart disease of native coronary artery without angina pectoris: Secondary | ICD-10-CM | POA: Diagnosis not present

## 2023-02-12 LAB — BASIC METABOLIC PANEL
Anion gap: 9 (ref 5–15)
BUN: 23 mg/dL (ref 8–23)
CO2: 30 mmol/L (ref 22–32)
Calcium: 7.9 mg/dL — ABNORMAL LOW (ref 8.9–10.3)
Chloride: 99 mmol/L (ref 98–111)
Creatinine, Ser: 1.5 mg/dL — ABNORMAL HIGH (ref 0.44–1.00)
GFR, Estimated: 33 mL/min — ABNORMAL LOW (ref >60)
Glucose, Bld: 88 mg/dL (ref 70–99)
Potassium: 4.4 mmol/L (ref 3.5–5.1)
Sodium: 138 mmol/L (ref 135–145)

## 2023-02-12 LAB — CBC
HCT: 28.5 % — ABNORMAL LOW (ref 36.0–46.0)
Hemoglobin: 8.9 g/dL — ABNORMAL LOW (ref 12.0–15.0)
MCH: 29.6 pg (ref 26.0–34.0)
MCHC: 31.2 g/dL (ref 30.0–36.0)
MCV: 94.7 fL (ref 80.0–100.0)
Platelets: 220 10*3/uL (ref 150–400)
RBC: 3.01 MIL/uL — ABNORMAL LOW (ref 3.87–5.11)
RDW: 19.1 % — ABNORMAL HIGH (ref 11.5–15.5)
WBC: 6 10*3/uL (ref 4.0–10.5)
nRBC: 0 % (ref 0.0–0.2)

## 2023-02-12 LAB — TYPE AND SCREEN
ABO/RH(D): A POS
Antibody Screen: NEGATIVE
Unit division: 0

## 2023-02-12 LAB — BPAM RBC
Blood Product Expiration Date: 202502282359
ISSUE DATE / TIME: 202502031101
Unit Type and Rh: 6200

## 2023-02-12 MED ORDER — OXYMETAZOLINE HCL 0.05 % NA SOLN
1.0000 | Freq: Two times a day (BID) | NASAL | Status: AC | PRN
Start: 2023-02-12 — End: 2023-02-15
  Administered 2023-02-13: 1 via NASAL
  Filled 2023-02-12: qty 30

## 2023-02-12 MED ORDER — LOPERAMIDE HCL 2 MG PO CAPS
2.0000 mg | ORAL_CAPSULE | Freq: Once | ORAL | Status: AC
  Administered 2023-02-12: 2 mg via ORAL
  Filled 2023-02-12: qty 1

## 2023-02-12 MED ORDER — DOXYCYCLINE HYCLATE 100 MG PO TABS
100.0000 mg | ORAL_TABLET | Freq: Two times a day (BID) | ORAL | Status: AC
  Administered 2023-02-12 – 2023-02-13 (×4): 100 mg via ORAL
  Filled 2023-02-12 (×5): qty 1

## 2023-02-12 NOTE — TOC Progression Note (Signed)
 Transition of Care [facility]) - Progression Note    Patient Details  Name: ROBBYE DEDE MRN: [id number] Date of Birth: Aug 15, 1930  [facility] CM/SW Contact  Luann SHAUNNA [place], [place] Phone Number: 02/12/2023, 3:07 PM  Clinical Narrative:     CSW spoke with [facility] lia[facility] and informed that pt discharged to home from them on 02/06/23. They would be able to have pt return if SNF is need. TOC will follow for PT/OT recs.   Social Determinants of Health (SDOH) Interventions SDOH Screenings   Food Insecurity: No Food Insecurity (02/08/2023)  Housing: Low Risk  (02/08/2023)  Recent Concern: Housing - High Risk (12/28/2022)  Transportation Needs: No Transportation Needs (02/08/2023)  Utilities: Not At Risk (02/08/2023)  Social Connections: Unknown (02/09/2023)  Tobacco Use: Medium Risk (02/10/2023)    Readmission Risk Interventions    07/24/2022    2:49 PM  Readmission Risk Prevention Plan  Transportation Screening Complete  PCP or Specialist Appt within 5-7 Days Complete  Home Care Screening Complete  Medication Review (RN CM) Complete

## 2023-02-12 NOTE — Progress Notes (Signed)
 Patient Name: Felicia Benson Date of Encounter: 02/12/2023 [facility] Cardiologist: None   Interval Summary  .    Feeling tired from diarrhea.   Vital Signs .    Vitals:   02/11/23 2101 02/11/23 2144 02/12/23 0457 02/12/23 0942  BP:  (!) 95/59 105/68 119/72  Pulse:  94 89   Resp:  17 19 20   Temp:  98.1 F (36.7 C) 98 F (36.7 C) 98.2 F (36.8 C)  TempSrc:   Oral Oral  SpO2: 92% 91% 94% 95%  Weight:      Height:        Intake/Output Summary (Last 24 hours) at 02/12/2023 1038 Last data filed at 02/12/2023 0900 Gross per 24 hour  Intake 1460 ml  Output 0 ml  Net 1460 ml      02/07/2023   10:00 PM 12/28/2022    3:33 PM 11/13/2022    5:00 AM  Last 3 Weights  Weight (lbs) 188 lb 0.8 oz 188 lb 215 lb 2.7 oz  Weight (kg) 85.3 kg 85.276 kg 97.6 kg      Telemetry/ECG    Telemetry- VP.  5 beats NSVT.  - Personally Reviewed  Echo 02/08/23:   1. Left ventricular ejection fraction, by estimation, is 50 to 55%. The  left ventricle has low normal function. The left ventricle demonstrates  regional wall motion abnormalities with septal hypokinesis. There is mild  concentric left ventricular  hypertrophy. Indeterminate diastolic filling due to E-A fusion.   2. D-shaped septum suggestive of RV pressure/volume overload. Right  ventricular systolic function is moderately reduced. The right ventricular  size is moderately enlarged. There is moderately elevated pulmonary artery  systolic pressure. The estimated  right ventricular systolic pressure is 56.2 mmHg.   3. Left atrial size was mildly dilated.   4. Right atrial size was moderately dilated.   5. The mitral valve is normal in structure. No evidence of mitral valve  regurgitation. No evidence of mitral stenosis.   6. The aortic valve is tricuspid. There is mild calcification of the  aortic valve. Aortic valve regurgitation is not visualized. No aortic  stenosis is present.   7. Aortic dilatation noted. There is  mild dilatation of the aortic root,  measuring 38 mm. There is mild dilatation of the ascending aorta,  measuring 43 mm.   8. The inferior vena cava is normal in size with <50% respiratory  variability, suggesting right atrial pressure of 8 mmHg.   Chest CT-A 01/11/23: IMPRESSION: 1. Small segmental pulmonary artery filling defect in the right lower lobe. The right ventricle is distended, similar in appearance to prior exams which may reflect right heart failure. The possibility of superimposed right heart strain can not be excluded. 2. Interstitial and ground-glass opacities in the lungs bilaterally, possible edema or infiltrate. 3. Patchy airspace disease in the lower lobes bilaterally, possible atelectasis or infiltrate. 4. Small bilateral pleural effusions. 5. Coronary artery calcifications. 6. Distended pulmonary trunk suggesting underlying pulmonary artery hypertension. 7. Aortic atherosclerosis with aneurysmal dilatation of the ascending aorta measuring 4.3 cm.   Physical Exam .    VS:  BP 119/72 (BP Location: Left Arm)   Pulse 89   Temp 98.2 F (36.8 C) (Oral)   Resp 20   Ht 5' 6 (1.676 m)   Wt 85.3 kg   SpO2 95%   BMI 30.35 kg/m  , BMI Body mass index is 30.35 kg/m. GENERAL:  Well appearing HEENT: Pupils equal round and reactive,  fundi not visualized, oral mucosa unremarkable NECK:  No jugular venous distention, waveform within normal limits, carotid upstroke brisk and symmetric, no bruits, no thyromegaly LUNGS:  Clear to auscultation bilaterally HEART:  RRR.  PMI not displaced or sustained,S1 and S2 within normal limits, no S3, no S4, no clicks, no rubs, III/VI systolic murmur at the LUSB ABD:  Flat, positive bowel sounds normal in frequency in pitch, no bruits, no rebound, no guarding, no midline pulsatile mass, no hepatomegaly, no splenomegaly EXT:  2 plus pulses throughout, no edema, no cyanosis no clubbing SKIN:  No rashes no nodules NEURO:  Cranial nerves II  through XII grossly intact, motor grossly intact throughout PSYCH:  Cognitively intact, oriented to person place and time   Assessment & Plan .     13F with Crohn's disease, recent GI bleed 12/2022, ascending aortic aneurysm, hypertension, COPD, CKD 3A, CAD status post PCI, status post PPM, and hyperlipidemia admitted with multifocal pneumonia and pulmonary embolism.  Cardiology consulted for acute diastolic heart failure.  # Acute on chronic diastolic HF:  # RV dysfunction: She is euvolemic to try.  Continue to hold lasix .  Creatinine seems to have plateaued.  Encouraged her to drink fluids today.  Continue metoprolol .   # Acute PE: Small subsegmental pulmonary embolism in the right lower lobe this admission.  Complicated by recent GI bleed and history of Crohn's disease.  Now s/p IVC filter placement.  H/h stable after transfusion.   # CAD s/p RCA PCI:  # Hyperlipidemia: Continue atorvastatin  and metoprolol .  # Multifocal pneumonia: Per primary team.    For questions or updates, please contact [facility] Please consult [email or web address] for contact info under        Signed, Annabella Scarce, MD

## 2023-02-12 NOTE — Progress Notes (Signed)
 [facility]                                                                              Felicia Benson, is a 89 y.o. female, DOB - Jun 05, 1930, FMW:[id number] Admit date - 02/07/2023    Outpatient Primary MD for the patient is Felicia Trula SQUIBB, MD  LOS - 5  days  Chief Complaint  Patient presents with   Pneumonia       Brief summary   Patient is a 89 year old female with osteoarthritis, Crohn's disease, GERD, HTN, AAA, admitted in 10/2022 with right hip fracture after mechanical fall and underwent ORIF, hospitalization in 12/2022 for rectal bleeding suspected to be diverticular bleeding 3 RBC transfusions.  CT angiography did not show active bleed.  Patient presented from SNF with cough, shortness of breath, fatigue, poor appetite for a week.  She was noted to have O2 sats of 90% by the facility on her chronic 4 L O2.  Denied any fevers, leg swelling, chest pain or palpitations. CTA chest showed small segmental pulmonary artery filling defect in the right lower lobe, interstitial and groundglass opacities in the lungs bilaterally, possible edema or infiltrate.  Patchy airspace disease in the lower lobes bilaterally.  Mild bilateral pleural effusions.   Assessment & Plan    Principal Problem:  CAP (community acquired pneumonia), multifocal Acute respiratory failure with hypoxia -Flu, COVID, RSV negative -Mild elevated troponin 23-23, likely demand ischemia --MBS done, mild age-related oropharyngeal dysphagia -Continue Tessalon  Perles, Mucinex , Hycodan for cough -Incentive spirometry -Per patient, she had diarrhea with ampicillin , even though changed to Augmentin , she had diarrhea again.  Will complete rest of the course with doxycycline  as patient is still having some wheezing today.  Volume overload with bilateral pleural effusions, mild acute on chronic diastolic CHF Right ventricular dysfunction (new from previous echo 07/23/2022) -2D echo 07/2022 had shown EF 60 to  65%, G1 DD -BNP 829.2, CTA with bilateral pleural effusions -Received extra Lasix  20 mg IV x 1 on 1/31, patient was placed on Lasix  40 mg p.o. daily -2D echo showed EF of 50 to 55%, septal hypokinesis, indeterminate diastolic filling.  D-shaped septum suggestive of RV pressure/volume overload, moderately reduced right ventricular systolic function.  Right ventricular size moderately large, moderately elevated PA systolic pressure. -Right ventricular dysfunction appears to be new from previous echo on 07/23/2022 -Cardiology following, euvolemic, recommending to hold Lasix     Acute pulmonary embolism (HCC), subsegmental PE, LLE DVT. -CTA chest showed small subsegmental pulmonary artery filling defect of the right lower lobe.  Right ventricular distended, may reflect right heart strain, interstitial and groundglass opacities in the lungs bilaterally possible edema or infiltrate.  Patchy airspace disease in the lower lobes. -  Venous Doppler showed acute DVT involving the left peroneal veins -Patient was placed on IV heparin  drip, hemoglobin on admission 9.2, trended down to 7.6 on 2/3 however no obvious bleeding.  Heparin  drip was discontinued on 2/2. -CT abdomen and for retroperitoneal bleeding, FOBT negative. -IVC filter placed on 2/3   History of Crohn's disease, recent diverticular bleed in 12/2022, GERD -Continue Protonix  -Not in flare, continue Pentasa    CAD  status post PCI, HLP -Currently no acute chest pain, continue aspirin , statin, metoprolol   Chronic kidney disease stage III a -Creatinine currently at baseline  Diarrhea -Unasyn / Augmentin  stopped - low suspicion for C. Difficile.  Patient was on Colace before, no leukocytosis, abdominal pain or fevers. -Placed on loperamide  as needed    Obesity class II Estimated body mass index is 30.35 kg/m as calculated from the following:   Height as of this encounter: 5' 6 (1.676 m).   Weight as of this encounter: 85.3 kg.  Code  Status: DNR DVT Prophylaxis:  SCDs Start: 02/07/23 2215   Level of Care: Level of care: Telemetry Medical Family Communication:   Updated patient's daughter on phone on 2/2  Disposition Plan:      Remains inpatient appropriate:      Procedures:    Consultants:   Pulmonology IR Cardiology   Antimicrobials:   Anti-infectives (From admission, onward)    Start     Dose/Rate Route Frequency Ordered Stop   02/12/23 1045  doxycycline  (VIBRA -TABS) tablet 100 mg        100 mg Oral Every 12 hours 02/12/23 0946     02/11/23 1000  amoxicillin -clavulanate (AUGMENTIN ) 500-125 MG per tablet 1 tablet  Status:  Discontinued        1 tablet Oral Every 12 hours 02/11/23 0733 02/12/23 0945   02/08/23 1000  azithromycin  (ZITHROMAX ) 500 mg in sodium chloride  0.9 % 250 mL IVPB        500 mg 250 mL/hr over 60 Minutes Intravenous Every 24 hours 02/07/23 2158 02/09/23 2019   02/07/23 2300  Ampicillin -Sulbactam (UNASYN ) 3 g in sodium chloride  0.9 % 100 mL IVPB  Status:  Discontinued        3 g 200 mL/hr over 30 Minutes Intravenous Every 6 hours 02/07/23 2229 02/11/23 0732   02/07/23 1600  cefTRIAXone  (ROCEPHIN ) 1 g in sodium chloride  0.9 % 100 mL IVPB  Status:  Discontinued        1 g 200 mL/hr over 30 Minutes Intravenous  Once 02/07/23 1548 02/07/23 2229   02/07/23 1600  azithromycin  (ZITHROMAX ) 500 mg in sodium chloride  0.9 % 250 mL IVPB        500 mg 250 mL/hr over 60 Minutes Intravenous  Once 02/07/23 1548 02/07/23 2047          Medications  atorvastatin   40 mg Oral QHS   benzonatate   200 mg Oral TID   dextromethorphan   15 mg Oral BID   doxycycline   100 mg Oral Q12H   feeding supplement  237 mL Oral BID BM   Gerhardt's butt cream   Topical BID   guaiFENesin   600 mg Oral BID   melatonin  3 mg Oral QHS   mesalamine   1,000 mg Oral BID   metoprolol  succinate  25 mg Oral Daily   mometasone -formoterol   2 puff Inhalation BID   montelukast   10 mg Oral Daily   pantoprazole   40 mg Oral BID    sodium chloride  flush  3 mL Intravenous Q12H      Subjective:   Felicia Benson was seen and examined today.  States diarrhea restarted with Augmentin .  No nausea vomiting, fevers or chills, no chest pain.    Objective:   Vitals:   02/11/23 2101 02/11/23 2144 02/12/23 0457 02/12/23 0942  BP:  (!) 95/59 105/68 119/72  Pulse:  94 89   Resp:  17 19 20   Temp:  98.1 F (36.7 C) 98 F (36.7 C)  98.2 F (36.8 C)  TempSrc:   Oral Oral  SpO2: 92% 91% 94% 95%  Weight:      Height:        Intake/Output Summary (Last 24 hours) at 02/12/2023 1408 Last data filed at 02/12/2023 0900 Gross per 24 hour  Intake 1080 ml  Output 0 ml  Net 1080 ml     Wt Readings from Last 3 Encounters:  02/07/23 85.3 kg  12/28/22 85.3 kg  11/13/22 97.6 kg   Physical Exam General: Alert and oriented x 3, NAD Cardiovascular: S1 S2 clear, RRR.  Respiratory: CTAB, no wheezing Gastrointestinal: Soft, nontender, nondistended, NBS Ext: no pedal edema bilaterally Neuro: no new deficits Psych: Normal affect     Data Reviewed:  I have personally reviewed following labs    CBC Lab Results  Component Value Date   WBC 6.0 02/12/2023   RBC 3.01 (L) 02/12/2023   HGB 8.9 (L) 02/12/2023   HCT 28.5 (L) 02/12/2023   MCV 94.7 02/12/2023   MCH 29.6 02/12/2023   PLT 220 02/12/2023   MCHC 31.2 02/12/2023   RDW 19.1 (H) 02/12/2023   LYMPHSABS 1.7 12/30/2022   MONOABS 1.2 (H) 12/30/2022   EOSABS 0.2 12/30/2022   BASOSABS 0.0 12/30/2022     Last metabolic panel Lab Results  Component Value Date   NA 138 02/12/2023   K 4.4 02/12/2023   CL 99 02/12/2023   CO2 30 02/12/2023   BUN 23 02/12/2023   CREATININE 1.50 (H) 02/12/2023   GLUCOSE 88 02/12/2023   GFRNONAA 33 (L) 02/12/2023   GFRAA 44 (L) 05/21/2019   CALCIUM  7.9 (L) 02/12/2023   PHOS 3.2 05/21/2019   PROT 5.3 (L) 02/07/2023   ALBUMIN  2.1 (L) 02/07/2023   BILITOT 0.5 02/07/2023   ALKPHOS 70 02/07/2023   AST 18 02/07/2023   ALT 8 02/07/2023    ANIONGAP 9 02/12/2023    CBG (last 3)  No results for input(s): GLUCAP in the last 72 hours.    Coagulation Profile: Recent Labs  Lab 02/08/23 0509  INR 1.3*     Radiology Studies: I have personally reviewed the imaging studies  IR IVC FILTER PLMT / S&I PORTER GUID/MOD SED Result Date: 02/11/2023 INDICATION: LEFT lower extremity DVT.  GI bleed.  Unable to anticoagulate. EXAM: Procedures: 1. CENTRAL VENOGRAM 2. INFERIOR VENA CAVA FILTER PLACEMENT COMPARISON:  CT AP 02/09/2018.  CTA abdomen pelvis, 12/30/2022. MEDICATIONS: None. ANESTHESIA/SEDATION: Local anesthetic was administered. The patient was continuously monitored during the procedure by the interventional radiology nurse under my direct supervision. CONTRAST:  35 mL Omnipaque  100 FLUOROSCOPY TIME:  Fluoroscopic dose; 31 mGy COMPLICATIONS: None immediate PROCEDURE: Informed written consent was obtained from the patient and/or patient's representative following explanation of the procedure, risks, benefits and alternatives. A time out was performed prior to the initiation of the procedure. Maximal barrier sterile technique utilized including caps, mask, sterile gowns, sterile gloves, large sterile drape, hand hygiene, and sterile prep. Under sterile condition and local anesthesia, RIGHT internal jugular venous access was performed with ultrasound. An ultrasound image was saved and sent to PACS. Over a guidewire, the IVC filter delivery sheath and inner dilator were advanced into the IVC just above the IVC bifurcation. Contrast injection was performed for an IVC venogram. Through the delivery sheath, a retrievable Denali IVC filter was deployed below the level of the renal veins and above the IVC bifurcation. Limited post deployment venacavagram was performed. The delivery sheath was removed and hemostasis was obtained with manual  compression. A dressing was placed. The patient tolerated the procedure well without immediate post procedural  complication. FINDINGS: The IVC is patent. No evidence of thrombus, stenosis, or occlusion. No variant venous anatomy. IMPRESSION: Successful placement of a retrievable infrarenal inferior vena cava (IVC) filter, via a transjugular approach. PLAN: IVC filters can cause complications when left in place for extended periods of time. If medically appropriate, recommend discontinuing filter prior to discharge. Please re-evaluate the patient for filter discontinuation when they are seen in follow up, and refer patient to Interventional Radiology for removal. Thom Hall, MD [facility] Electronically Signed   By: Thom Hall M.D.   On: 02/11/2023 16:43       Cinch Ormond M.D. [facility] 02/12/2023, 2:08 PM  Available via Epic secure chat 7am-7pm After 7 pm, please refer to night coverage provider listed on amion.

## 2023-02-13 DIAGNOSIS — J189 Pneumonia, unspecified organism: Secondary | ICD-10-CM | POA: Diagnosis not present

## 2023-02-13 DIAGNOSIS — R0602 Shortness of breath: Secondary | ICD-10-CM | POA: Diagnosis not present

## 2023-02-13 DIAGNOSIS — I2510 Atherosclerotic heart disease of native coronary artery without angina pectoris: Secondary | ICD-10-CM | POA: Diagnosis not present

## 2023-02-13 DIAGNOSIS — I82409 Acute embolism and thrombosis of unspecified deep veins of unspecified lower extremity: Secondary | ICD-10-CM

## 2023-02-13 DIAGNOSIS — I2699 Other pulmonary embolism without acute cor pulmonale: Secondary | ICD-10-CM | POA: Diagnosis not present

## 2023-02-13 LAB — BASIC METABOLIC PANEL
Anion gap: 7 (ref 5–15)
BUN: 23 mg/dL (ref 8–23)
CO2: 30 mmol/L (ref 22–32)
Calcium: 8 mg/dL — ABNORMAL LOW (ref 8.9–10.3)
Chloride: 100 mmol/L (ref 98–111)
Creatinine, Ser: 1.57 mg/dL — ABNORMAL HIGH (ref 0.44–1.00)
GFR, Estimated: 31 mL/min — ABNORMAL LOW (ref >60)
Glucose, Bld: 95 mg/dL (ref 70–99)
Potassium: 4.6 mmol/L (ref 3.5–5.1)
Sodium: 137 mmol/L (ref 135–145)

## 2023-02-13 LAB — CBC
HCT: 28.7 % — ABNORMAL LOW (ref 36.0–46.0)
Hemoglobin: 8.8 g/dL — ABNORMAL LOW (ref 12.0–15.0)
MCH: 28.9 pg (ref 26.0–34.0)
MCHC: 30.7 g/dL (ref 30.0–36.0)
MCV: 94.1 fL (ref 80.0–100.0)
Platelets: 230 10*3/uL (ref 150–400)
RBC: 3.05 MIL/uL — ABNORMAL LOW (ref 3.87–5.11)
RDW: 18.6 % — ABNORMAL HIGH (ref 11.5–15.5)
WBC: 6.1 10*3/uL (ref 4.0–10.5)
nRBC: 0 % (ref 0.0–0.2)

## 2023-02-13 MED ORDER — ONDANSETRON 4 MG PO TBDP
4.0000 mg | ORAL_TABLET | Freq: Three times a day (TID) | ORAL | Status: DC | PRN
  Administered 2023-02-14 – 2023-02-19 (×10): 4 mg via ORAL
  Filled 2023-02-13 (×11): qty 1

## 2023-02-13 NOTE — [place] FL2 (Signed)
 [place]  MEDICAID FL2 LEVEL OF CARE FORM     IDENTIFICATION  Patient Name: Felicia Benson Birthdate: Aug 11, 1930 Sex: female Admission Date (Current Location): 02/07/2023  [facility] [place] Number:  Producer, Television/film/video and Address:  [facility], [address] [place], [place] [address]      Provider Number: [id number]  Attending Physician Name and Address:  Davia Nydia POUR, MD  Relative Name and Phone Number:       Current Level of Care: Hospital Recommended Level of Care: Skilled Nursing Facility Prior Approval Number:    Date Approved/Denied:   PASRR Number: [id number]  Discharge Plan: SNF    Current Diagnoses: Patient Active Problem List   Diagnosis Date Noted   Shortness of breath 02/13/2023   DVT (deep venous thrombosis) (HCC) 02/11/2023   Acute pulmonary embolism (HCC) 02/08/2023   Acute respiratory failure with hypoxia (HCC) 02/08/2023   CAP (community acquired pneumonia) 02/07/2023   Filling defect on imaging study 02/07/2023   CKD (chronic kidney disease) 02/07/2023   Pneumonia 02/07/2023   Diverticular hemorrhage 12/28/2022   Ileus, postoperative (HCC) 11/04/2022   Closed right hip fracture (HCC) 10/28/2022   Fall at home, initial encounter 10/28/2022   History of COPD 10/28/2022   Allergic rhinitis 10/28/2022   Anemia of chronic disease 10/28/2022   Chronic rhinitis 08/06/2022   OSA (obstructive sleep apnea) 08/06/2022   Heart failure, acute diastolic (HCC) 07/21/2022   CKD stage 3a, GFR 45-59 ml/min (HCC) 07/21/2022   Thoracic aortic aneurysm (HCC) 07/21/2022   GERD (gastroesophageal reflux disease) 07/21/2022   Pancreatic lesion 07/21/2022   Macular degeneration 07/21/2022   Hypocalcemia 07/21/2022   Insomnia 07/21/2022   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   TIA (transient ischemic attack) 12/31/2020   Acute on chronic diastolic heart failure (HCC) 05/19/2019   Acute CHF (congestive heart failure) (HCC)  05/18/2019   Chronic hypoxic respiratory failure (HCC) 05/18/2019   COPD (chronic obstructive pulmonary disease) (HCC) 05/18/2019   History of cardiac pacemaker 05/18/2019   AAA (abdominal aortic aneurysm) 05/18/2019   Hyperlipidemia 05/18/2019   Abdominal pain in female 05/30/2015   Essential hypertension 05/30/2015   Crohn's disease (HCC) 05/30/2015   Gastrointestinal hemorrhage with melena 05/30/2015   Blood loss anemia 05/30/2015   Hyponatremia 05/30/2015   Kidney disease 05/30/2015   Normocytic anemia 05/30/2015   CAD S/P percutaneous coronary angioplasty 05/30/2015   Acute blood loss anemia 05/30/2015   Heme + stool     Orientation RESPIRATION BLADDER Height & Weight     Self, Time, Situation, Place  O2 (4L nasal cannula) Incontinent, External catheter Weight: 188 lb 0.8 oz (85.3 kg) Height:  5' 6 (167.6 cm)  BEHAVIORAL SYMPTOMS/MOOD NEUROLOGICAL BOWEL NUTRITION STATUS      Continent Diet (See dc summary)  AMBULATORY STATUS COMMUNICATION OF NEEDS Skin   Limited Assist Verbally PU Stage and Appropriate Care (Stage II pressure ulcer on sacrum)                       Personal Care Assistance Level of Assistance  Bathing, Feeding, Dressing Bathing Assistance: Limited assistance Feeding assistance: Limited assistance Dressing Assistance: Limited assistance     Functional Limitations Info  Sight, Hearing Sight Info: Impaired Hearing Info: Impaired      SPECIAL CARE FACTORS FREQUENCY  PT (By licensed PT), OT (By licensed OT)     PT Frequency: 5x/week OT Frequency: 5x/week  Contractures Contractures Info: Not present    Additional Factors Info  Code Status, Allergies Code Status Info: DNR Allergies Info: Motrin (Ibuprofen), Ace Inhibitors, Breztri  Aerosphere (Budeson-glycopyrrol-formoterol ), Cipro (Ciprofloxacin Hcl), Levaquin (Levofloxacin), Nsaids           Current Medications (02/13/2023):  This is the current hospital active medication  list Current Facility-Administered Medications  Medication Dose Route Frequency Provider Last Rate Last Admin   acetaminophen  (TYLENOL ) tablet 650 mg  650 mg Oral Q6H PRN Moody Alto, MD   650 mg at 02/12/23 2307   Or   acetaminophen  (TYLENOL ) suppository 650 mg  650 mg Rectal Q6H PRN Moody Alto, MD       albuterol  (PROVENTIL ) (2.5 MG/3ML) 0.083% nebulizer solution 2.5 mg  2.5 mg Inhalation Q6H PRN Moody Alto, MD   2.5 mg at 02/08/23 9786   atorvastatin  (LIPITOR) tablet 40 mg  40 mg Oral QHS Goel, Hersh, MD   40 mg at 02/12/23 2307   benzonatate  (TESSALON ) capsule 200 mg  200 mg Oral TID Rai, Ripudeep K, MD   200 mg at 02/13/23 1016   dextromethorphan  (DELSYM ) 30 MG/5ML liquid 15 mg  15 mg Oral BID Sundil, Subrina, MD   15 mg at 02/13/23 1017   doxycycline  (VIBRA -TABS) tablet 100 mg  100 mg Oral Q12H Rai, Ripudeep K, MD   100 mg at 02/13/23 1016   feeding supplement (ENSURE ENLIVE / ENSURE PLUS) liquid 237 mL  237 mL Oral BID BM Rai, Ripudeep K, MD   237 mL at 02/08/23 1600   Gerhardt's butt cream   Topical BID Rai, Ripudeep K, MD   Given at 02/13/23 1015   guaiFENesin  (MUCINEX ) 12 hr tablet 600 mg  600 mg Oral BID Sundil, Subrina, MD   600 mg at 02/13/23 1016   HYDROcodone  bit-homatropine (HYCODAN) 5-1.5 MG/5ML syrup 5 mL  5 mL Oral Q6H PRN Rai, Ripudeep K, MD   5 mL at 02/12/23 2340   loperamide  (IMODIUM ) capsule 2 mg  2 mg Oral Q8H PRN Rai, Ripudeep K, MD       meclizine  (ANTIVERT ) tablet 12.5 mg  12.5 mg Oral TID PRN Sundil, Subrina, MD   12.5 mg at 02/08/23 2131   melatonin tablet 3 mg  3 mg Oral QHS Sundil, Subrina, MD   3 mg at 02/12/23 2307   mesalamine  (PENTASA ) CR capsule 1,000 mg  1,000 mg Oral BID Goel, Hersh, MD   1,000 mg at 02/13/23 1017   metoprolol  succinate (TOPROL -XL) 24 hr tablet 25 mg  25 mg Oral Daily Goel, Hersh, MD   25 mg at 02/13/23 1016   mometasone -formoterol  (DULERA ) 200-5 MCG/ACT inhaler 2 puff  2 puff Inhalation BID Moody Alto, MD   2 puff at 02/13/23 9075    montelukast  (SINGULAIR ) tablet 10 mg  10 mg Oral Daily Goel, Hersh, MD   10 mg at 02/13/23 1016   ondansetron  (ZOFRAN ) injection 4 mg  4 mg Intravenous Q6H PRN Rai, Ripudeep K, MD   4 mg at 02/12/23 2214   oxymetazoline  (AFRIN) 0.05 % nasal spray 1 spray  1 spray Each Nare BID PRN Sundil, Subrina, MD   1 spray at 02/13/23 1015   pantoprazole  (PROTONIX ) EC tablet 40 mg  40 mg Oral BID Goel, Hersh, MD   40 mg at 02/13/23 1016   phenylephrine -shark liver oil-mineral oil-petrolatum  (PREPARATION H) rectal ointment 1 Application  1 Application Rectal TID PRN Pham, Minh Q, RPH-CPP       sodium chloride  flush (NS)  0.9 % injection 3 mL  3 mL Intravenous Q12H Moody Alto, MD   3 mL at 02/13/23 1018     Discharge Medications: Please see discharge summary for a list of discharge medications.  Relevant Imaging Results:  Relevant Lab Results:   Additional Information SSN: [id number]  Inocente RAMAN Datra Clary, LCSW

## 2023-02-13 NOTE — TOC Progression Note (Signed)
 Transition of Care [facility]) - Progression Note    Patient Details  Name: Felicia Benson MRN: [id number] Date of Birth: 24-Oct-1930  Transition of Care [facility]) CM/SW Contact  Inocente GORMAN Kindle, LCSW Phone Number: 02/13/2023, 12:44 PM  Clinical Narrative:    11:53am-CSW left voicemail for patient's daughter.   12:45 PM-Daughter returned call and requested SNF patient for patient. They are planning on completing Medicaid application for patient for long term care as patient has used 77 of her 100 SNF rehab days. CSW will inquire with SNF on ability to accept patient back.      Barriers to Discharge: Continued Medical Work up, SNF Pending bed offer  Expected Discharge Plan and Services     Post Acute Care Choice: Skilled Nursing Facility Living arrangements for the past 2 months: Skilled Nursing Facility                                       Social Determinants of Health (SDOH) Interventions SDOH Screenings   Food Insecurity: No Food Insecurity (02/08/2023)  Housing: Low Risk  (02/08/2023)  Recent Concern: Housing - High Risk (12/28/2022)  Transportation Needs: No Transportation Needs (02/08/2023)  Utilities: Not At Risk (02/08/2023)  Social Connections: Unknown (02/09/2023)  Tobacco Use: Medium Risk (02/10/2023)    Readmission Risk Interventions    07/24/2022    2:49 PM  Readmission Risk Prevention Plan  Transportation Screening Complete  PCP or Specialist Appt within 5-7 Days Complete  Home Care Screening Complete  Medication Review (RN CM) Complete

## 2023-02-13 NOTE — Evaluation (Signed)
 Occupational Therapy Evaluation Patient Details Name: Felicia Benson MRN: [id number] DOB: August 07, 1930 Today's Date: 02/13/2023   History of Present Illness Pt is a 89 y/o female presented with cough, SOB and decrease in appetite for the last week. Pt found to have CAP, acute PE,subsegmental PE LLE DVT. Pt s/p IVC placement on 2/3.  PMH includes: chronic hypoxic respiratory failure on baseline and 4 L continuous nasal cannula, COPD, chronic diastolic heart failure, essential pretension, hyperlipidemia anemia of chronic disease is a baseline hemoglobin range 10-12, ORIF RLE (10/24), rectal bleeding with 3RBC transfusions (12/24)   Clinical Impression   Pt reported they were just released from SNF stay and they were max able to ambulate 102 ft with FW with chair follow. She reported that the family is now looking at a possible long term care but is not sure at this time. As she doe not mind going somewhere to get stronger but adventitially would like to go home. She was able to completed bed mobility and transfers with min assist. At the EOB was able to complete UE dressing with set up. She then was able to mobilize with FW to chair with min guard/line management. Patient will benefit from continued inpatient follow up therapy, <3 hours/day.        If plan is discharge home, recommend the following: A little help with walking and/or transfers;A little help with bathing/dressing/bathroom;Assistance with cooking/housework;Assist for transportation;Help with stairs or ramp for entrance    Functional Status Assessment  Patient has had a recent decline in their functional status and demonstrates the ability to make significant improvements in function in a reasonable and predictable amount of time.  Equipment Recommendations   (TBD at next level of care)    Recommendations for Other Services       Precautions / Restrictions Precautions Precautions: Fall Restrictions Weight Bearing Restrictions Per  Provider Order: No      Mobility Bed Mobility Overal bed mobility: Needs Assistance Bed Mobility: Supine to Sit     Supine to sit: Min assist, HOB elevated, Used rails          Transfers Overall transfer level: Needs assistance Equipment used: Rolling walker (2 wheels) Transfers: Sit to/from Stand Sit to Stand: Min assist                  Balance Overall balance assessment: Needs assistance Sitting-balance support: Feet supported Sitting balance-Leahy Scale: Good     Standing balance support: Bilateral upper extremity supported Standing balance-Leahy Scale: Poor                             ADL either performed or assessed with clinical judgement   ADL Overall ADL's : Needs assistance/impaired Eating/Feeding: Set up;Sitting   Grooming: Wash/dry hands;Wash/dry face;Set up;Sitting   Upper Body Bathing: Set up;Sitting   Lower Body Bathing: Moderate assistance;Sit to/from stand;Maximal assistance   Upper Body Dressing : Set up;Sitting   Lower Body Dressing: Moderate assistance;Maximal assistance;Sit to/from stand   Toilet Transfer: Contact guard assist;Rolling walker (2 wheels)   Toileting- Clothing Manipulation and Hygiene: Moderate assistance;Maximal assistance;Sit to/from stand       Functional mobility during ADLs: Contact guard assist;Rolling walker (2 wheels)       Vision Baseline Vision/History: 6 Macular Degeneration Patient Visual Report:  (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 and significant decrease in vision in L eye)       Perception Perception: Not  tested       Praxis         Pertinent Vitals/Pain Pain Assessment Pain Assessment: No/denies pain     Extremity/Trunk Assessment Upper Extremity Assessment Upper Extremity Assessment: Overall WFL for tasks assessed   Lower Extremity Assessment Lower Extremity Assessment: Defer to PT evaluation   Cervical / Trunk Assessment Cervical / Trunk Assessment:  Kyphotic   Communication Communication Communication: Hearing impairment Cueing Techniques: Verbal cues   Cognition Arousal: Alert Behavior During Therapy: WFL for tasks assessed/performed Overall Cognitive Status: Within Functional Limits for tasks assessed                                       General Comments       Exercises     Shoulder Instructions      Home Living Family/patient expects to be discharged to:: Private residence (Per pt reported about going somewhere with long term care but not sure. Per noted from home layout) Living Arrangements: Children Available Help at Discharge: Family (daughter and grandchild) Type of Home: House Home Access: Stairs to enter Secretary/administrator of Steps: 1 Entrance Stairs-Rails: None Home Layout: One level     Bathroom Shower/Tub: Producer, Television/film/video: Handicapped height     Home Equipment: Agricultural Consultant (2 wheels);Cane - single point;Shower seat;BSC/3in1   Additional Comments: 4L O2 baseline, wears CPAP at night      Prior Functioning/Environment Prior Level of Function : Needs assist       Physical Assist : Mobility (physical);ADLs (physical)     Mobility Comments: Pt reported that they were ablt to max go 102 ft with FW and chair follow          OT Problem List: Decreased strength;Decreased activity tolerance;Impaired balance (sitting and/or standing);Decreased knowledge of use of DME or AE;Cardiopulmonary status limiting activity      OT Treatment/Interventions: Self-care/ADL training;Energy conservation;DME and/or AE instruction;Therapeutic activities;Patient/family education;Balance training    OT Goals(Current goals can be found in the care plan section) Acute Rehab OT Goals Patient Stated Goal: to get stronger OT Goal Formulation: With patient Time For Goal Achievement: 02/27/23 Potential to Achieve Goals: Good  OT Frequency: Min 1X/week    Co-evaluation PT/OT/SLP  Co-Evaluation/Treatment: Yes Reason for Co-Treatment: Complexity of the patient's impairments (multi-system involvement)   OT goals addressed during session: ADL's and self-care      AM-PAC OT 6 Clicks Daily Activity     Outcome Measure Help from another person eating meals?: A Little Help from another person taking care of personal grooming?: A Little Help from another person toileting, which includes using toliet, bedpan, or urinal?: A Lot Help from another person bathing (including washing, rinsing, drying)?: A Lot Help from another person to put on and taking off regular upper body clothing?: A Little Help from another person to put on and taking off regular lower body clothing?: A Lot 6 Click Score: 15   End of Session Equipment Utilized During Treatment: Gait belt;Rolling walker (2 wheels) Nurse Communication: Mobility status  Activity Tolerance: Patient tolerated treatment well Patient left: in chair;with call bell/phone within reach;with chair alarm set  OT Visit Diagnosis: Unsteadiness on feet (R26.81);Other abnormalities of gait and mobility (R26.89);Repeated falls (R29.6);Muscle weakness (generalized) (M62.81)                Time: 9061-8986 OT Time Calculation (min): 35 min Charges:  OT General Charges $  OT Visit: 1 Visit OT Evaluation $OT Eval Moderate Complexity: 1 Mod  Warrick POUR OTR/L  Acute Rehab Services  [phone] office number   Warrick Berber 02/13/2023, 11:36 AM

## 2023-02-13 NOTE — Progress Notes (Signed)
 [facility]                                                                              Felicia Benson, is a 89 y.o. female, DOB - 10-Jan-1930, FMW:[id number] Admit date - 02/07/2023    Outpatient Primary MD for the patient is Dwight Trula SQUIBB, MD  LOS - 6  days  Chief Complaint  Patient presents with   Pneumonia       Brief summary   Patient is a 89 year old female with osteoarthritis, Crohn's disease, GERD, HTN, AAA, admitted in 10/2022 with right hip fracture after mechanical fall and underwent ORIF, hospitalization in 12/2022 for rectal bleeding suspected to be diverticular bleeding 3 RBC transfusions.  CT angiography did not show active bleed.  Patient presented from SNF with cough, shortness of breath, fatigue, poor appetite for a week.  She was noted to have O2 sats of 90% by the facility on her chronic 4 L O2.  Denied any fevers, leg swelling, chest pain or palpitations. CTA chest showed small segmental pulmonary artery filling defect in the right lower lobe, interstitial and groundglass opacities in the lungs bilaterally, possible edema or infiltrate.  Patchy airspace disease in the lower lobes bilaterally.  Mild bilateral pleural effusions.   Assessment & Plan    Principal Problem:  CAP (community acquired pneumonia), multifocal Acute respiratory failure with hypoxia -Flu, COVID, RSV negative -Mild elevated troponin 23-23, likely demand ischemia --MBS done, mild age-related oropharyngeal dysphagia -Cough improving, continue Tessalon  Perles, Mucinex , Hycodan for cough, I-S -Diarrhea better.  Patient had reported diarrhea with ampicillin  and Augmentin  -Completing antibiotics today, received x7 days  Volume overload with bilateral pleural effusions, mild acute on chronic diastolic CHF Right ventricular dysfunction (new from previous echo 07/23/2022) -2D echo 07/2022 had shown EF 60 to 65%, G1 DD -BNP 829.2, CTA with bilateral pleural effusions -Received extra  Lasix  20 mg IV x 1 on 1/31, patient was placed on Lasix  40 mg p.o. daily -2D echo showed EF of 50 to 55%, septal hypokinesis, indeterminate diastolic filling.  D-shaped septum suggestive of RV pressure/volume overload, moderately reduced right ventricular systolic function.  Right ventricular size moderately large, moderately elevated PA systolic pressure. -Right ventricular dysfunction appears to be new from previous echo on 07/23/2022 -Cardiology following, euvolemic, creatinine plateaued at 1.5, currently Lasix  on hold.       Acute pulmonary embolism (HCC), subsegmental PE, LLE DVT. -CTA chest showed small subsegmental pulmonary artery filling defect of the right lower lobe.  Right ventricular distended, may reflect right heart strain, interstitial and groundglass opacities in the lungs bilaterally possible edema or infiltrate.  Patchy airspace disease in the lower lobes. -  Venous Doppler showed acute DVT involving the left peroneal veins -Patient was placed on IV heparin  drip, hemoglobin on admission 9.2, trended down to 7.6 on 2/3 however no obvious bleeding.  Heparin  drip was discontinued on 2/2. -CT abdomen negative for retroperitoneal bleeding, FOBT negative. -IVC filter placed on 2/3 -No acute issues   History of Crohn's disease, recent diverticular bleed in 12/2022, GERD -Continue Protonix  -Not in flare, continue Pentasa    CAD status post PCI, HLP -Currently  no acute chest pain, continue aspirin , statin, metoprolol   Chronic kidney disease stage III a -Baseline creatinine ~1.0-1.1 -Creatinine trended up due to Lasix , has plateaued at 1.5.  Diarrhea -Unasyn / Augmentin  stopped - low suspicion for C. Difficile.  Patient was on Colace before, no leukocytosis, abdominal pain or fevers. -Placed on loperamide  as needed -Diarrhea improved  Generalized debility -PT OT evaluation recommended SNF   Obesity class II Estimated body mass index is 30.35 kg/m as calculated from the  following:   Height as of this encounter: 5' 6 (1.676 m).   Weight as of this encounter: 85.3 kg.  Code Status: DNR DVT Prophylaxis:  SCDs Start: 02/07/23 2215   Level of Care: Level of care: Telemetry Medical Family Communication:   Updated patient's daughter on phone on 2/2, called today again, went straight to voicemail Disposition Plan:      Remains inpatient appropriate:   Possible DC to SNF tomorrow   Procedures:    Consultants:   Pulmonology IR Cardiology   Antimicrobials:   Anti-infectives (From admission, onward)    Start     Dose/Rate Route Frequency Ordered Stop   02/12/23 1045  doxycycline  (VIBRA -TABS) tablet 100 mg        100 mg Oral Every 12 hours 02/12/23 0946 02/13/23 2359   02/11/23 1000  amoxicillin -clavulanate (AUGMENTIN ) 500-125 MG per tablet 1 tablet  Status:  Discontinued        1 tablet Oral Every 12 hours 02/11/23 0733 02/12/23 0945   02/08/23 1000  azithromycin  (ZITHROMAX ) 500 mg in sodium chloride  0.9 % 250 mL IVPB        500 mg 250 mL/hr over 60 Minutes Intravenous Every 24 hours 02/07/23 2158 02/09/23 2019   02/07/23 2300  Ampicillin -Sulbactam (UNASYN ) 3 g in sodium chloride  0.9 % 100 mL IVPB  Status:  Discontinued        3 g 200 mL/hr over 30 Minutes Intravenous Every 6 hours 02/07/23 2229 02/11/23 0732   02/07/23 1600  cefTRIAXone  (ROCEPHIN ) 1 g in sodium chloride  0.9 % 100 mL IVPB  Status:  Discontinued        1 g 200 mL/hr over 30 Minutes Intravenous  Once 02/07/23 1548 02/07/23 2229   02/07/23 1600  azithromycin  (ZITHROMAX ) 500 mg in sodium chloride  0.9 % 250 mL IVPB        500 mg 250 mL/hr over 60 Minutes Intravenous  Once 02/07/23 1548 02/07/23 2047          Medications  atorvastatin   40 mg Oral QHS   benzonatate   200 mg Oral TID   dextromethorphan   15 mg Oral BID   doxycycline   100 mg Oral Q12H   feeding supplement  237 mL Oral BID BM   Gerhardt's butt cream   Topical BID   guaiFENesin   600 mg Oral BID   melatonin  3 mg  Oral QHS   mesalamine   1,000 mg Oral BID   metoprolol  succinate  25 mg Oral Daily   mometasone -formoterol   2 puff Inhalation BID   montelukast   10 mg Oral Daily   pantoprazole   40 mg Oral BID   sodium chloride  flush  3 mL Intravenous Q12H      Subjective:   Felicia Benson was seen and examined today.  States diarrhea improved, no acute nausea vomiting, fever chills or any bleeding issues.  Generalized weakness +  .    Objective:   Vitals:   02/12/23 1642 02/12/23 2052 02/13/23 0851 02/13/23 0924  BP: 93/60  109/62 119/70   Pulse: 88 84 80 83  Resp: 16 17  18   Temp: 98 F (36.7 C) 98.6 F (37 C) 98.1 F (36.7 C)   TempSrc:  Oral Oral   SpO2: 94% 92% 92% 92%  Weight:      Height:        Intake/Output Summary (Last 24 hours) at 02/13/2023 1137 Last data filed at 02/13/2023 0945 Gross per 24 hour  Intake 1200 ml  Output 501 ml  Net 699 ml     Wt Readings from Last 3 Encounters:  02/07/23 85.3 kg  12/28/22 85.3 kg  11/13/22 97.6 kg    Physical Exam General: Alert and oriented x 3, NAD Cardiovascular: S1 S2 clear, RRR.  Respiratory: CTAB, no wheezing Gastrointestinal: Soft, nontender, nondistended, NBS Ext: no pedal edema bilaterally Neuro: no new deficits Psych: Normal affect      Data Reviewed:  I have personally reviewed following labs    CBC Lab Results  Component Value Date   WBC 6.1 02/13/2023   RBC 3.05 (L) 02/13/2023   HGB 8.8 (L) 02/13/2023   HCT 28.7 (L) 02/13/2023   MCV 94.1 02/13/2023   MCH 28.9 02/13/2023   PLT 230 02/13/2023   MCHC 30.7 02/13/2023   RDW 18.6 (H) 02/13/2023   LYMPHSABS 1.7 12/30/2022   MONOABS 1.2 (H) 12/30/2022   EOSABS 0.2 12/30/2022   BASOSABS 0.0 12/30/2022     Last metabolic panel Lab Results  Component Value Date   NA 137 02/13/2023   K 4.6 02/13/2023   CL 100 02/13/2023   CO2 30 02/13/2023   BUN 23 02/13/2023   CREATININE 1.57 (H) 02/13/2023   GLUCOSE 95 02/13/2023   GFRNONAA 31 (L) 02/13/2023    GFRAA 44 (L) 05/21/2019   CALCIUM  8.0 (L) 02/13/2023   PHOS 3.2 05/21/2019   PROT 5.3 (L) 02/07/2023   ALBUMIN  2.1 (L) 02/07/2023   BILITOT 0.5 02/07/2023   ALKPHOS 70 02/07/2023   AST 18 02/07/2023   ALT 8 02/07/2023   ANIONGAP 7 02/13/2023    CBG (last 3)  No results for input(s): GLUCAP in the last 72 hours.    Coagulation Profile: Recent Labs  Lab 02/08/23 0509  INR 1.3*     Radiology Studies: I have personally reviewed the imaging studies  IR IVC FILTER PLMT / S&I PORTER GUID/MOD SED Result Date: 02/11/2023 INDICATION: LEFT lower extremity DVT.  GI bleed.  Unable to anticoagulate. EXAM: Procedures: 1. CENTRAL VENOGRAM 2. INFERIOR VENA CAVA FILTER PLACEMENT COMPARISON:  CT AP 02/09/2018.  CTA abdomen pelvis, 12/30/2022. MEDICATIONS: None. ANESTHESIA/SEDATION: Local anesthetic was administered. The patient was continuously monitored during the procedure by the interventional radiology nurse under my direct supervision. CONTRAST:  35 mL Omnipaque  100 FLUOROSCOPY TIME:  Fluoroscopic dose; 31 mGy COMPLICATIONS: None immediate PROCEDURE: Informed written consent was obtained from the patient and/or patient's representative following explanation of the procedure, risks, benefits and alternatives. A time out was performed prior to the initiation of the procedure. Maximal barrier sterile technique utilized including caps, mask, sterile gowns, sterile gloves, large sterile drape, hand hygiene, and sterile prep. Under sterile condition and local anesthesia, RIGHT internal jugular venous access was performed with ultrasound. An ultrasound image was saved and sent to PACS. Over a guidewire, the IVC filter delivery sheath and inner dilator were advanced into the IVC just above the IVC bifurcation. Contrast injection was performed for an IVC venogram. Through the delivery sheath, a retrievable Denali IVC filter was deployed below  the level of the renal veins and above the IVC bifurcation. Limited post  deployment venacavagram was performed. The delivery sheath was removed and hemostasis was obtained with manual compression. A dressing was placed. The patient tolerated the procedure well without immediate post procedural complication. FINDINGS: The IVC is patent. No evidence of thrombus, stenosis, or occlusion. No variant venous anatomy. IMPRESSION: Successful placement of a retrievable infrarenal inferior vena cava (IVC) filter, via a transjugular approach. PLAN: IVC filters can cause complications when left in place for extended periods of time. If medically appropriate, recommend discontinuing filter prior to discharge. Please re-evaluate the patient for filter discontinuation when they are seen in follow up, and refer patient to Interventional Radiology for removal. Thom Hall, MD [facility] Radiology Electronically Signed   By: Thom Hall M.D.   On: 02/11/2023 16:43       Kaaren Nass M.D. [facility] 02/13/2023, 11:37 AM  Available via Epic secure chat 7am-7pm After 7 pm, please refer to night coverage provider listed on amion.

## 2023-02-13 NOTE — Evaluation (Signed)
 Physical Therapy Evaluation  Patient Details Name: Felicia Benson MRN: [id number] DOB: [birth year] Today's Date: 02/13/2023  History of Present Illness  Pt is a 89 y/o female presented with cough, SOB and decrease in appetite for the last week. Pt found to have CAP, acute PE, subsegmental PE LLE DVT. Pt s/p IVC placement on 2/3.  PMH includes: chronic hypoxic respiratory failure on baseline and 4 L continuous nasal cannula, COPD, chronic diastolic heart failure, essential pretension, hyperlipidemia anemia of chronic disease is a baseline hemoglobin range 10-12, ORIF RLE (10/24), rectal bleeding with 3RBC transfusions (12/24)   Clinical Impression  Pt admitted with above diagnosis. Pt currently with functional limitations due to the deficits listed below (see PT Problem List). At the time of PT eval pt was able to perform transfers and ambulation with up to min assist and RW for support. Initially pt reporting it's been days since she's been up and did not appear confident that she would be able to, however overall did very well from a PT perspective. VSS throughout session on 4.5 L/min supplemental O2. Recommend return to SNF to complete short term rehab to prepare for return home with family support. Pt will benefit from acute skilled PT to increase their independence and safety with mobility to allow discharge.           If plan is discharge home, recommend the following: A little help with walking and/or transfers;A little help with bathing/dressing/bathroom;Assistance with cooking/housework;Assist for transportation;Help with stairs or ramp for entrance   Can travel by private vehicle   Yes    Equipment Recommendations Other (comment) (TBD by next venue of care)  Recommendations for Other Services       Functional Status Assessment Patient has had a recent decline in their functional status and demonstrates the ability to make significant improvements in function in a reasonable and  predictable amount of time.     Precautions / Restrictions Precautions Precautions: Fall Restrictions Weight Bearing Restrictions Per Provider Order: No      Mobility  Bed Mobility Overal bed mobility: Needs Assistance Bed Mobility: Supine to Sit     Supine to sit: Min assist, Used rails     General bed mobility comments: Pt initiating movement well. Assist to complete transition fully around to EOB.    Transfers Overall transfer level: Needs assistance Equipment used: Rolling walker (2 wheels) Transfers: Sit to/from Stand Sit to Stand: Min assist           General transfer comment: VC's for hand placement on seated surface for safety.    Ambulation/Gait Ambulation/Gait assistance: Contact guard assist Gait Distance (Feet): 20 Feet (10+10) Assistive device: Rolling walker (2 wheels) Gait Pattern/deviations: Step-through pattern, Decreased stride length, Trunk flexed Gait velocity: Decreased Gait velocity interpretation: <1.31 ft/sec, indicative of household ambulator   General Gait Details: VC's for safety and navigating walker in a tighter space due to low vision. Pt ambulated to the [facility] and then from [facility] to recliner. ~20' total.  Stairs            Wheelchair Mobility     Tilt Bed    Modified Rankin (Stroke Patients Only)       Balance Overall balance assessment: Needs assistance Sitting-balance support: Feet supported Sitting balance-Leahy Scale: Good     Standing balance support: Bilateral upper extremity supported, During functional activity, Reliant on assistive device for balance Standing balance-Leahy Scale: Poor  Pertinent Vitals/Pain Pain Assessment Pain Assessment: No/denies pain    Home Living Family/patient expects to be discharged to:: Private residence (Per pt reported about going somewhere with long term care but not sure. Home set up from prior notes) Living Arrangements:  Children Available Help at Discharge: Family (daughter and grandchild) Type of Home: House Home Access: Stairs to enter Entrance Stairs-Rails: None Entrance Stairs-Number of Steps: 1   Home Layout: One level Home Equipment: [employer]ant (2 wheels);Cane - single point;Shower seat;BSC/3in1 Additional Comments: 4L O2 baseline, wears CPAP at night    Prior Function Prior Level of Function : Needs assist       Physical Assist : Mobility (physical);ADLs (physical)     Mobility Comments: Pt reported that they were able to max go 102 ft with RW and chair follow ADLs Comments: pt d/c from hospital following R femur fx and IM nail and transitioned to [facility] for rehab. pt is to return to SNF and complete short term rehab then home     Extremity/Trunk Assessment   Upper Extremity Assessment Upper Extremity Assessment: Defer to OT evaluation    Lower Extremity Assessment Lower Extremity Assessment: Generalized weakness    Cervical / Trunk Assessment Cervical / Trunk Assessment: Kyphotic  Communication   Communication Communication: Hearing impairment Cueing Techniques: Verbal cues;Tactile cues;Other (comments) (Pt with low vision due to macular degeneration)  Cognition Arousal: Alert Behavior During Therapy: WFL for tasks assessed/performed Overall Cognitive Status: Within Functional Limits for tasks assessed                                          General Comments General comments (skin integrity, edema, etc.): Incontinent; pt has a pack of briefs she prefers to wear in her room    Exercises     Assessment/Plan    PT Assessment Patient needs continued PT services  PT Problem List Decreased strength;Decreased activity tolerance;Decreased balance;Decreased mobility;Decreased knowledge of use of DME;Decreased safety awareness;Decreased knowledge of precautions;Pain       PT Treatment Interventions DME instruction;Gait training;Functional mobility  training;Therapeutic activities;Therapeutic exercise;Balance training;Patient/family education    PT Goals (Current goals can be found in the Care Plan section)  Acute Rehab PT Goals Patient Stated Goal: Be able to eventually return home PT Goal Formulation: With patient Time For Goal Achievement: 02/27/23 Potential to Achieve Goals: Good    Frequency Min 1X/week     Co-evaluation PT/OT/SLP Co-Evaluation/Treatment: Yes Reason for Co-Treatment: Complexity of the patient's impairments (multi-system involvement) PT goals addressed during session: Mobility/safety with mobility;Balance;Proper use of DME;Strengthening/ROM OT goals addressed during session: ADL's and self-care       AM-PAC PT 6 Clicks Mobility  Outcome Measure Help needed turning from your back to your side while in a flat bed without using bedrails?: A Little Help needed moving from lying on your back to sitting on the side of a flat bed without using bedrails?: A Little Help needed moving to and from a bed to a chair (including a wheelchair)?: A Little Help needed standing up from a chair using your arms (e.g., wheelchair or bedside chair)?: A Little Help needed to walk in hospital room?: A Little Help needed climbing 3-5 steps with a railing? : A Little 6 Click Score: 18    End of Session Equipment Utilized During Treatment: Gait belt Activity Tolerance: Patient tolerated treatment well Patient left: in chair;with call  bell/phone within reach;with chair alarm set Nurse Communication: Mobility status PT Visit Diagnosis: Unsteadiness on feet (R26.81);Difficulty in walking, not elsewhere classified (R26.2)    Time: 9062-8984 PT Time Calculation (min) (ACUTE ONLY): 38 min   Charges:   PT Evaluation $PT Eval Moderate Complexity: 1 Mod PT Treatments $Gait Training: 8-22 mins PT General Charges $$ ACUTE PT VISIT: 1 Visit         Leita Sable, PT, DPT Acute Rehabilitation Services Secure Chat  Preferred Office: [phone]   Leita JONETTA Sable 02/13/2023, 1:27 PM

## 2023-02-14 DIAGNOSIS — R0602 Shortness of breath: Secondary | ICD-10-CM | POA: Diagnosis not present

## 2023-02-14 DIAGNOSIS — I5033 Acute on chronic diastolic (congestive) heart failure: Secondary | ICD-10-CM | POA: Diagnosis not present

## 2023-02-14 DIAGNOSIS — I2699 Other pulmonary embolism without acute cor pulmonale: Secondary | ICD-10-CM | POA: Diagnosis not present

## 2023-02-14 DIAGNOSIS — J189 Pneumonia, unspecified organism: Secondary | ICD-10-CM | POA: Diagnosis not present

## 2023-02-14 LAB — BASIC METABOLIC PANEL
Anion gap: 9 (ref 5–15)
BUN: 24 mg/dL — ABNORMAL HIGH (ref 8–23)
CO2: 27 mmol/L (ref 22–32)
Calcium: 8 mg/dL — ABNORMAL LOW (ref 8.9–10.3)
Chloride: 99 mmol/L (ref 98–111)
Creatinine, Ser: 1.31 mg/dL — ABNORMAL HIGH (ref 0.44–1.00)
GFR, Estimated: 39 mL/min — ABNORMAL LOW (ref >60)
Glucose, Bld: 119 mg/dL — ABNORMAL HIGH (ref 70–99)
Potassium: 3.4 mmol/L — ABNORMAL LOW (ref 3.5–5.1)
Sodium: 135 mmol/L (ref 135–145)

## 2023-02-14 LAB — CBC
HCT: 28 % — ABNORMAL LOW (ref 36.0–46.0)
Hemoglobin: 8.8 g/dL — ABNORMAL LOW (ref 12.0–15.0)
MCH: 29.2 pg (ref 26.0–34.0)
MCHC: 31.4 g/dL (ref 30.0–36.0)
MCV: 93 fL (ref 80.0–100.0)
Platelets: 217 10*3/uL (ref 150–400)
RBC: 3.01 MIL/uL — ABNORMAL LOW (ref 3.87–5.11)
RDW: 17.9 % — ABNORMAL HIGH (ref 11.5–15.5)
WBC: 6.2 10*3/uL (ref 4.0–10.5)
nRBC: 0 % (ref 0.0–0.2)

## 2023-02-14 MED ORDER — POTASSIUM CHLORIDE CRYS ER 20 MEQ PO TBCR
40.0000 meq | EXTENDED_RELEASE_TABLET | Freq: Once | ORAL | Status: AC
  Administered 2023-02-14: 40 meq via ORAL
  Filled 2023-02-14: qty 2

## 2023-02-14 NOTE — TOC Progression Note (Signed)
 Transition of Care [facility]) - Progression Note    Patient Details  Name: Felicia Benson MRN: [id number] Date of Birth: 02-19-1930  [facility]) CM/SW Contact  Debby Clyne [place]aran[place], [place] Phone Number: 02/14/2023, 1:06 PM  Clinical Narrative:  Discussed current SNF offers with pt's dtr [name] and she is considering [address], [place], [place], and [place]. Dtr notes the preference is for [facility] and to have the pt transition to LTC post rehab. Spoke to [place] at [facility] who confirms they do have LTC availability but will need to complete a Medicaid screen with dtr by phone. Also confirmed LTC availability with [facility], [place], and [place].   Voicemail left for dtr with above update and requested dtr call [place] at [facility] to complete screen. Will provide updates as available.  Julien Das, MSW, LCSW [phone] (coverage)         Barriers to Discharge: Continued Medical Work up, SNF Pending bed offer  Expected Discharge Plan and Services     Post Acute Care Choice: Skilled Nursing Facility Living arrangements for the past 2 months: Skilled Nursing Facility                                       Social Determinants of Health (SDOH) Interventions SDOH Screenings   Food Insecurity: No Food Insecurity (02/08/2023)  Housing: Low Risk  (02/08/2023)  Recent Concern: Housing - High Risk (12/28/2022)  Transportation Needs: No Transportation Needs (02/08/2023)  Utilities: Not At Risk (02/08/2023)  Social Connections: Unknown (02/09/2023)  Tobacco Use: Medium Risk (02/10/2023)    Readmission Risk Interventions    07/24/2022    2:49 PM  Readmission Risk Prevention Plan  Transportation Screening Complete  PCP or Specialist Appt within 5-7 Days Complete  Home Care Screening Complete  Medication Review (RN CM) Complete

## 2023-02-14 NOTE — Progress Notes (Signed)
 [facility]                                                                              Felicia Benson, is a 89 y.o. female, DOB - [birth year], FMW:[id number] Admit date - 02/07/2023    Outpatient Primary MD for the patient is Dwight Trula SQUIBB, MD  LOS - 7  days  Chief Complaint  Patient presents with   Pneumonia       Brief summary   Patient is a 89 year old female with osteoarthritis, Crohn's disease, GERD, HTN, AAA, admitted in 10/2022 with right hip fracture after mechanical fall and underwent ORIF, hospitalization in 12/2022 for rectal bleeding suspected to be diverticular bleeding 3 RBC transfusions.  CT angiography did not show active bleed.  Patient presented from SNF with cough, shortness of breath, fatigue, poor appetite for a week.  She was noted to have O2 sats of 90% by the facility on her chronic 4 L O2.  Denied any fevers, leg swelling, chest pain or palpitations. CTA chest showed small segmental pulmonary artery filling defect in the right lower lobe, interstitial and groundglass opacities in the lungs bilaterally, possible edema or infiltrate.  Patchy airspace disease in the lower lobes bilaterally.  Mild bilateral pleural effusions.   Assessment & Plan    Principal Problem:  CAP (community acquired pneumonia), multifocal Acute respiratory failure with hypoxia -Flu, COVID, RSV negative -Mild elevated troponin 23-23, likely demand ischemia --MBS done, mild age-related oropharyngeal dysphagia -Cough improving, continue Tessalon  Perles, Mucinex , Hycodan for cough, I-S -Diarrhea better.  Patient had reported diarrhea with Unasyn  and Augmentin  -Completed antibiotics on 02/13/2023, received for 7 days   Volume overload with bilateral pleural effusions, mild acute on chronic diastolic CHF Right ventricular dysfunction (new from previous echo 07/23/2022) -2D echo 07/2022 had shown EF 60 to 65%, G1 DD -BNP 829.2, CTA with bilateral pleural effusions -Received  extra Lasix  20 mg IV x 1 on 1/31, patient was placed on Lasix  40 mg p.o. daily -2D echo showed EF of 50 to 55%, septal hypokinesis, indeterminate diastolic filling.  D-shaped septum suggestive of RV pressure/volume overload, moderately reduced right ventricular systolic function.  Right ventricular size moderately large, moderately elevated PA systolic pressure. -Right ventricular dysfunction appears to be new from previous echo on 07/23/2022 -Cardiology following, euvolemic, Lasix  was placed on hold, creatinine has improved to 1.3     Acute pulmonary embolism (HCC), subsegmental PE, LLE DVT. -CTA chest showed small subsegmental pulmonary artery filling defect of the right lower lobe.  Right ventricular distended, may reflect right heart strain, interstitial and groundglass opacities in the lungs bilaterally possible edema or infiltrate.  Patchy airspace disease in the lower lobes. - Venous Doppler showed acute DVT involving the left peroneal veins -Patient was placed on IV heparin  drip, hemoglobin on admission 9.2, trended down to 7.6 on 2/3.  Heparin  drip was discontinued on 2/2. -CT abdomen negative for retroperitoneal bleeding, FOBT negative. -IVC filter placed on 2/3 -No acute issues, H&H stable   History of Crohn's disease, recent diverticular bleed in 12/2022, GERD -Continue Protonix  -Not in flare, continue Pentasa    CAD status post PCI, HLP -Currently  no acute chest pain, continue aspirin , statin, metoprolol   Chronic kidney disease stage III a -Baseline creatinine ~1.0-1.1 -Creatinine trended up due to Lasix , has plateaued at 1.5, now improving 1.3 today.  Diarrhea -Unasyn / Augmentin  stopped - low suspicion for C. Difficile.  Patient was on Colace before, no leukocytosis, abdominal pain or fevers. -Placed on loperamide  as needed -Diarrhea improved  Generalized debility -PT OT evaluation recommended SNF   Obesity class II Estimated body mass index is 30.35 kg/m as  calculated from the following:   Height as of this encounter: 5' 6 (1.676 m).   Weight as of this encounter: 85.3 kg.  Code Status: DNR DVT Prophylaxis:  SCDs Start: 02/07/23 2215   Level of Care: Level of care: Telemetry Medical Family Communication:    Disposition Plan:      Remains inpatient appropriate:   Possible DC to SNF when bed available   Procedures:    Consultants:   Pulmonology IR Cardiology   Antimicrobials:   Anti-infectives (From admission, onward)    Start     Dose/Rate Route Frequency Ordered Stop   02/12/23 1045  doxycycline  (VIBRA -TABS) tablet 100 mg        100 mg Oral Every 12 hours 02/12/23 0946 02/13/23 2103   02/11/23 1000  amoxicillin -clavulanate (AUGMENTIN ) 500-125 MG per tablet 1 tablet  Status:  Discontinued        1 tablet Oral Every 12 hours 02/11/23 0733 02/12/23 0945   02/08/23 1000  azithromycin  (ZITHROMAX ) 500 mg in sodium chloride  0.9 % 250 mL IVPB        500 mg 250 mL/hr over 60 Minutes Intravenous Every 24 hours 02/07/23 2158 02/09/23 2019   02/07/23 2300  Ampicillin -Sulbactam (UNASYN ) 3 g in sodium chloride  0.9 % 100 mL IVPB  Status:  Discontinued        3 g 200 mL/hr over 30 Minutes Intravenous Every 6 hours 02/07/23 2229 02/11/23 0732   02/07/23 1600  cefTRIAXone  (ROCEPHIN ) 1 g in sodium chloride  0.9 % 100 mL IVPB  Status:  Discontinued        1 g 200 mL/hr over 30 Minutes Intravenous  Once 02/07/23 1548 02/07/23 2229   02/07/23 1600  azithromycin  (ZITHROMAX ) 500 mg in sodium chloride  0.9 % 250 mL IVPB        500 mg 250 mL/hr over 60 Minutes Intravenous  Once 02/07/23 1548 02/07/23 2047          Medications  atorvastatin   40 mg Oral QHS   benzonatate   200 mg Oral TID   dextromethorphan   15 mg Oral BID   feeding supplement  237 mL Oral BID BM   Gerhardt's butt cream   Topical BID   guaiFENesin   600 mg Oral BID   melatonin  3 mg Oral QHS   mesalamine   1,000 mg Oral BID   metoprolol  succinate  25 mg Oral Daily    mometasone -formoterol   2 puff Inhalation BID   montelukast   10 mg Oral Daily   pantoprazole   40 mg Oral BID   sodium chloride  flush  3 mL Intravenous Q12H      Subjective:   Felicia Benson was seen and examined today.  No acute complaints, diarrhea has improved.  No chest pain, shortness of breath, fevers.  Generalized weakness  .    Objective:   Vitals:   02/13/23 1923 02/13/23 2148 02/14/23 0519 02/14/23 0740  BP:  105/66 110/67 (!) 89/55  Pulse: 80 93 86 83  Resp: 16 18 18  18  Temp:  (!) 97.3 F (36.3 C) 98.3 F (36.8 C)   TempSrc:  Oral Oral   SpO2: 93% 96% 94% 93%  Weight:      Height:        Intake/Output Summary (Last 24 hours) at 02/14/2023 1144 Last data filed at 02/14/2023 0519 Gross per 24 hour  Intake 120 ml  Output 550 ml  Net -430 ml     Wt Readings from Last 3 Encounters:  02/07/23 85.3 kg  12/28/22 85.3 kg  11/13/22 97.6 kg   Physical Exam General: Alert and oriented x 3, NAD Cardiovascular: S1 S2 clear, RRR.  Respiratory: CTAB, no wheezing Gastrointestinal: Soft, nontender, nondistended, NBS Ext: no pedal edema bilaterally Neuro: no new deficits Psych: Normal affect     Data Reviewed:  I have personally reviewed following labs    CBC Lab Results  Component Value Date   WBC 6.2 02/14/2023   RBC 3.01 (L) 02/14/2023   HGB 8.8 (L) 02/14/2023   HCT 28.0 (L) 02/14/2023   MCV 93.0 02/14/2023   MCH 29.2 02/14/2023   PLT 217 02/14/2023   MCHC 31.4 02/14/2023   RDW 17.9 (H) 02/14/2023   LYMPHSABS 1.7 12/30/2022   MONOABS 1.2 (H) 12/30/2022   EOSABS 0.2 12/30/2022   BASOSABS 0.0 12/30/2022     Last metabolic panel Lab Results  Component Value Date   NA 135 02/14/2023   K 3.4 (L) 02/14/2023   CL 99 02/14/2023   CO2 27 02/14/2023   BUN 24 (H) 02/14/2023   CREATININE 1.31 (H) 02/14/2023   GLUCOSE 119 (H) 02/14/2023   GFRNONAA 39 (L) 02/14/2023   GFRAA 44 (L) 05/21/2019   CALCIUM  8.0 (L) 02/14/2023   PHOS 3.2 05/21/2019   PROT  5.3 (L) 02/07/2023   ALBUMIN  2.1 (L) 02/07/2023   BILITOT 0.5 02/07/2023   ALKPHOS 70 02/07/2023   AST 18 02/07/2023   ALT 8 02/07/2023   ANIONGAP 9 02/14/2023    CBG (last 3)  No results for input(s): GLUCAP in the last 72 hours.    Coagulation Profile: Recent Labs  Lab 02/08/23 0509  INR 1.3*     Radiology Studies: I have personally reviewed the imaging studies  No results found.      Nydia Distance M.D. [facility] 02/14/2023, 11:44 AM  Available via Epic secure chat 7am-7pm After 7 pm, please refer to night coverage provider listed on amion.

## 2023-02-14 NOTE — Progress Notes (Signed)
 PT Cancellation Note  Patient Details Name: Felicia Benson MRN: [id number] DOB: [birth year]   Cancelled Treatment:    Reason Eval/Treat Not Completed: Fatigue/lethargy limiting ability to participate;Other (comment) I checked on patient at 2:30PM and she reported she was nauseous and fatigued, asked me to return in 30 min for session to give nausea medicine time to get into her system. I returned at 3:05PM and pt reports her stomach is still bothering her and she is too fatigued this afternoon. Politely asked PT to return tomorrow morning after breakfast. Will continue to follow and attempt tomorrow morning to progress tx as time/schedule allows.   Izetta Call, PT, DPT   Acute Rehabilitation Department Office [phone] Secure Chat Communication Preferred  Izetta JULIANNA Call 02/14/2023, 3:08 PM

## 2023-02-15 DIAGNOSIS — J189 Pneumonia, unspecified organism: Secondary | ICD-10-CM | POA: Diagnosis not present

## 2023-02-15 DIAGNOSIS — I2609 Other pulmonary embolism with acute cor pulmonale: Secondary | ICD-10-CM

## 2023-02-15 DIAGNOSIS — I50810 Right heart failure, unspecified: Secondary | ICD-10-CM | POA: Diagnosis not present

## 2023-02-15 DIAGNOSIS — I5033 Acute on chronic diastolic (congestive) heart failure: Secondary | ICD-10-CM | POA: Diagnosis not present

## 2023-02-15 LAB — BASIC METABOLIC PANEL
Anion gap: 7 (ref 5–15)
BUN: 24 mg/dL — ABNORMAL HIGH (ref 8–23)
CO2: 25 mmol/L (ref 22–32)
Calcium: 7.7 mg/dL — ABNORMAL LOW (ref 8.9–10.3)
Chloride: 99 mmol/L (ref 98–111)
Creatinine, Ser: 1.24 mg/dL — ABNORMAL HIGH (ref 0.44–1.00)
GFR, Estimated: 41 mL/min — ABNORMAL LOW (ref >60)
Glucose, Bld: 138 mg/dL — ABNORMAL HIGH (ref 70–99)
Potassium: 4.2 mmol/L (ref 3.5–5.1)
Sodium: 131 mmol/L — ABNORMAL LOW (ref 135–145)

## 2023-02-15 MED ORDER — FUROSEMIDE 40 MG PO TABS
40.0000 mg | ORAL_TABLET | Freq: Every day | ORAL | Status: DC
  Administered 2023-02-16 – 2023-02-18 (×3): 40 mg via ORAL
  Filled 2023-02-15 (×3): qty 1

## 2023-02-15 NOTE — TOC Progression Note (Signed)
 Transition of Care [facility]) - Progression Note    Patient Details  Name: DANIYLA PFAHLER MRN: [id number] Date of Birth: [birth year]  [facility] CM/SW Contact  Gibran Veselka [place], [place] Phone Number: 02/15/2023, 4:01 PM  Clinical Narrative: Call received from pt's dtr [name] who reports she has toured [facility] and would like to accept offer. Notified [facility] in admissions who confirmed bed availability. [facility] is able to accept over weekend pending medical clearance. SW will follow.   Julien Das, MSW, LCSW [phone] (coverage)          Barriers to Discharge: Continued Medical Work up, SNF Pending bed offer  Expected Discharge Plan and Services     Post Acute Care Choice: Skilled Nursing Facility Living arrangements for the past 2 months: Skilled Nursing Facility                                       Social Determinants of Health (SDOH) Interventions SDOH Screenings   Food Insecurity: No Food Insecurity (02/08/2023)  Housing: Low Risk  (02/08/2023)  Recent Concern: Housing - High Risk (12/28/2022)  Transportation Needs: No Transportation Needs (02/08/2023)  Utilities: Not At Risk (02/08/2023)  Social Connections: Unknown (02/09/2023)  Tobacco Use: Medium Risk (02/10/2023)    Readmission Risk Interventions    07/24/2022    2:49 PM  Readmission Risk Prevention Plan  Transportation Screening Complete  PCP or Specialist Appt within 5-7 Days Complete  Home Care Screening Complete  Medication Review (RN CM) Complete

## 2023-02-15 NOTE — Progress Notes (Signed)
 Patient Name: Felicia Benson Date of Encounter: 02/15/2023 [facility] Cardiologist: None   Interval Summary  .    Fatigued.    Vital Signs .    Vitals:   02/14/23 1950 02/15/23 0602 02/15/23 0711 02/15/23 0758  BP: 117/75 116/68 112/77   Pulse: 89 76 77 78  Resp: 18 18  18   Temp: 98.4 F (36.9 C) 98.1 F (36.7 C) 97.8 F (36.6 C)   TempSrc:   Oral   SpO2: 96% 96% 96%   Weight:      Height:        Intake/Output Summary (Last 24 hours) at 02/15/2023 0931 Last data filed at 02/15/2023 0856 Gross per 24 hour  Intake 790 ml  Output 1300 ml  Net -510 ml      02/07/2023   10:00 PM 12/28/2022    3:33 PM 11/13/2022    5:00 AM  Last 3 Weights  Weight (lbs) 188 lb 0.8 oz 188 lb 215 lb 2.7 oz  Weight (kg) 85.3 kg 85.276 kg 97.6 kg      Telemetry/ECG    Telemetry- ASVP.  Personally Reviewed  Echo 02/08/23:   1. Left ventricular ejection fraction, by estimation, is 50 to 55%. The  left ventricle has low normal function. The left ventricle demonstrates  regional wall motion abnormalities with septal hypokinesis. There is mild  concentric left ventricular  hypertrophy. Indeterminate diastolic filling due to E-A fusion.   2. D-shaped septum suggestive of RV pressure/volume overload. Right  ventricular systolic function is moderately reduced. The right ventricular  size is moderately enlarged. There is moderately elevated pulmonary artery  systolic pressure. The estimated  right ventricular systolic pressure is 56.2 mmHg.   3. Left atrial size was mildly dilated.   4. Right atrial size was moderately dilated.   5. The mitral valve is normal in structure. No evidence of mitral valve  regurgitation. No evidence of mitral stenosis.   6. The aortic valve is tricuspid. There is mild calcification of the  aortic valve. Aortic valve regurgitation is not visualized. No aortic  stenosis is present.   7. Aortic dilatation noted. There is mild dilatation of the aortic  root,  measuring 38 mm. There is mild dilatation of the ascending aorta,  measuring 43 mm.   8. The inferior vena cava is normal in size with <50% respiratory  variability, suggesting right atrial pressure of 8 mmHg.   Chest CT-A 01/11/23: IMPRESSION: 1. Small segmental pulmonary artery filling defect in the right lower lobe. The right ventricle is distended, similar in appearance to prior exams which may reflect right heart failure. The possibility of superimposed right heart strain can not be excluded. 2. Interstitial and ground-glass opacities in the lungs bilaterally, possible edema or infiltrate. 3. Patchy airspace disease in the lower lobes bilaterally, possible atelectasis or infiltrate. 4. Small bilateral pleural effusions. 5. Coronary artery calcifications. 6. Distended pulmonary trunk suggesting underlying pulmonary artery hypertension. 7. Aortic atherosclerosis with aneurysmal dilatation of the ascending aorta measuring 4.3 cm.   Physical Exam .    VS:  BP 112/77   Pulse 78   Temp 97.8 F (36.6 C) (Oral)   Resp 18   Ht 5' 6 (1.676 m)   Wt 85.3 kg   SpO2 96%   BMI 30.35 kg/m  , BMI Body mass index is 30.35 kg/m. GENERAL:  Well appearing HEENT: Pupils equal round and reactive, fundi not visualized, oral mucosa unremarkable NECK:  No jugular venous distention,  waveform within normal limits, carotid upstroke brisk and symmetric, no bruits, no thyromegaly LUNGS:  Clear to auscultation bilaterally HEART:  RRR.  PMI not displaced or sustained,S1 and S2 within normal limits, no S3, no S4, no clicks, no rubs, III/VI systolic murmur at the LUSB ABD:  Flat, positive bowel sounds normal in frequency in pitch, no bruits, no rebound, no guarding, no midline pulsatile mass, no hepatomegaly, no splenomegaly EXT:  2 plus pulses throughout, no edema, no cyanosis no clubbing SKIN:  No rashes no nodules NEURO:  Cranial nerves II through XII grossly intact, motor grossly intact  throughout PSYCH:  Cognitively intact, oriented to person place and time   Assessment & Plan .     71F with Crohn's disease, recent GI bleed 12/2022, ascending aortic aneurysm, hypertension, COPD, CKD 3A, CAD status post PCI, status post PPM, and hyperlipidemia admitted with multifocal pneumonia and pulmonary embolism.  Cardiology consulted for acute diastolic heart failure.  # Acute on chronic diastolic HF:  # RV dysfunction: She developed AKI in the setting of diuresis.  Renal function now improving.  Creatinine seems to have plateaued.  Continue metoprolol .   She takes lasix  40mg  po daily.  Plan to resume this tomorrow.   # Acute PE: Small subsegmental pulmonary embolism in the right lower lobe this admission.  Complicated by recent GI bleed and history of Crohn's disease.  Now s/p IVC filter placement.  H/h stable after transfusion.   # CAD s/p RCA PCI:  # Hyperlipidemia: Continue atorvastatin  and metoprolol .  # Multifocal pneumonia: Per primary team.    For questions or updates, please contact [facility] Please consult [email or web address] for contact info under        Signed, Annabella Scarce, MD

## 2023-02-15 NOTE — Progress Notes (Signed)
 Mobility Specialist Progress Note:    02/15/23 1000  Oxygen Therapy  O2 Device Nasal Cannula  O2 Flow Rate (L/min) 4 L/min  Mobility  Activity Ambulated with assistance in room;Transferred from bed to chair  Level of Assistance Contact guard assist, steadying assist  Assistive Device Front wheel walker  Distance Ambulated (ft) 25 ft  Activity Response Tolerated well  Mobility Referral Yes  Mobility visit 1 Mobility  Mobility Specialist Start Time (ACUTE ONLY) 0847  Mobility Specialist Stop Time (ACUTE ONLY) [id number]  Mobility Specialist Time Calculation (min) (ACUTE ONLY) 17 min   Pt received in bed and agreeable. Required minA to come EOB and minG to ambulate around bed to chair. No complaints throughout. Pt left in chair with call bell and all needs met. Chair alarm on.  Felicia Benson Mobility Specialist Please contact via Special Educational Needs Teacher or Rehab office at [phone]

## 2023-02-15 NOTE — Progress Notes (Signed)
  Progress Note   Patient: Felicia Benson FMW:[id number] DOB: [birth year] DOA: 02/07/2023     8 DOS: the patient was seen and examined on 02/15/2023   Brief hospital course: Patient is a 89 year old female with osteoarthritis, Crohn's disease, GERD, HTN, AAA, admitted in 10/2022 with right hip fracture after mechanical fall and underwent ORIF, hospitalization in 12/2022 for rectal bleeding suspected to be diverticular bleeding 3 RBC transfusions.  CT angiography did not show active bleed.  Patient presented from SNF with cough, shortness of breath, fatigue, poor appetite for a week.  She was noted to have O2 sats of 90% by the facility on her chronic 4 L O2.  Denied any fevers, leg swelling, chest pain or palpitations. CTA chest showed small segmental pulmonary artery filling defect in the right lower lobe, interstitial and groundglass opacities in the lungs bilaterally, possible edema or infiltrate.  Patchy airspace disease in the lower lobes bilaterally.  Mild bilateral pleural effusions.  Assessment and Plan: * CAP (community acquired pneumonia) - Completed 7 days of antibx  - Tessalon  200 mg PO tid  - Delsym  15 mg PO bid  - Mucinex  600 mg PO bid   COPD (chronic obstructive pulmonary disease) (HCC) - Albuterol  q6 hr PRN  - Dulera  2 puff bid  - Singulair  10 mg PO daily   CKD (chronic kidney disease) - Stable; monitor   Filling defect on imaging study - s/p IVC filter   CAD S/P percutaneous coronary angioplasty - Lipitor 40 mg PO at bedtime - Lasix  40 mg PO daily   - Toprol  XL 25 mg PO daily   Gastrointestinal hemorrhage with melena - Protonix  40 mg PO bid   Crohn's disease (HCC) - Pentasa  1000 mg PO bid   Acute on chronic diastolic HF - Lasix  as above       Subjective: Pt seen and examined at the bedside. She still has a cough that is productive of phlegm. Per CM/SW note it appears the pt's disposition will be to SNF (and authorization is currently being worked  on).  Physical Exam: Vitals:   02/14/23 1950 02/15/23 0602 02/15/23 0711 02/15/23 0758  BP: 117/75 116/68 112/77   Pulse: 89 76 77 78  Resp: 18 18  18   Temp: 98.4 F (36.9 C) 98.1 F (36.7 C) 97.8 F (36.6 C)   TempSrc:   Oral   SpO2: 96% 96% 96%   Weight:      Height:       Physical Exam HENT:     Head: Normocephalic.     Mouth/Throat:     Mouth: Mucous membranes are moist.  Cardiovascular:     Rate and Rhythm: Normal rate and regular rhythm.  Pulmonary:     Effort: Pulmonary effort is normal.  Abdominal:     Palpations: Abdomen is soft.  Musculoskeletal:        General: Normal range of motion.     Cervical back: Neck supple.  Skin:    General: Skin is warm.  Neurological:     Mental Status: She is alert. Mental status is at baseline.  Psychiatric:        Mood and Affect: Mood normal.     Disposition: Status is: Inpatient Remains inpatient appropriate because: Awaiting safe disposition   Planned Discharge Destination: Skilled nursing facility    Time spent: 35 minutes  Author: Shantera Monts , MD 02/15/2023 2:29 PM  For on call review [email or web address].

## 2023-02-15 NOTE — Progress Notes (Signed)
 Mobility Specialist Progress Note:    02/15/23 1144  Mobility  Activity Transferred from chair to bed  Level of Assistance Moderate assist, patient does 50-74%  Assistive Device Front wheel walker  Distance Ambulated (ft) 4 ft  Activity Response Tolerated well  Mobility Referral Yes  Mobility visit 1 Mobility  Mobility Specialist Start Time (ACUTE ONLY) 1022  Mobility Specialist Stop Time (ACUTE ONLY) 1038  Mobility Specialist Time Calculation (min) (ACUTE ONLY) 16 min   Pt received in chair, requesting assistance back to bed. Required modA to stand from lower surface. Pt able to help bring herself up in bed w/ bed rails and minA. No complaints throughout. Pt left in bed with call bell and all needs met. Bed alarm on.  D'Vante Nicholaus Mobility Specialist Please contact via Special Educational Needs Teacher or Rehab office at [phone]

## 2023-02-16 DIAGNOSIS — I2699 Other pulmonary embolism without acute cor pulmonale: Secondary | ICD-10-CM | POA: Diagnosis not present

## 2023-02-16 LAB — BASIC METABOLIC PANEL
Anion gap: 8 (ref 5–15)
BUN: 26 mg/dL — ABNORMAL HIGH (ref 8–23)
CO2: 27 mmol/L (ref 22–32)
Calcium: 7.9 mg/dL — ABNORMAL LOW (ref 8.9–10.3)
Chloride: 99 mmol/L (ref 98–111)
Creatinine, Ser: 1.19 mg/dL — ABNORMAL HIGH (ref 0.44–1.00)
GFR, Estimated: 43 mL/min — ABNORMAL LOW (ref >60)
Glucose, Bld: 92 mg/dL (ref 70–99)
Potassium: 4.6 mmol/L (ref 3.5–5.1)
Sodium: 134 mmol/L — ABNORMAL LOW (ref 135–145)

## 2023-02-16 LAB — PHOSPHORUS: Phosphorus: 3.8 mg/dL (ref 2.5–4.6)

## 2023-02-16 LAB — MAGNESIUM: Magnesium: 1.7 mg/dL (ref 1.7–2.4)

## 2023-02-16 MED ORDER — LIDOCAINE 5 % EX PTCH
1.0000 | MEDICATED_PATCH | CUTANEOUS | Status: DC
  Administered 2023-02-16 – 2023-02-18 (×3): 1 via TRANSDERMAL
  Filled 2023-02-16 (×3): qty 1

## 2023-02-16 NOTE — Plan of Care (Signed)
  Problem: Health Behavior/Discharge Planning: Goal: Ability to manage health-related needs will improve Outcome: Progressing   Problem: Clinical Measurements: Goal: Ability to maintain clinical measurements within normal limits will improve Outcome: Progressing Goal: Will remain free from infection Outcome: Progressing Goal: Diagnostic test results will improve Outcome: Progressing Goal: Respiratory complications will improve Outcome: Progressing Goal: Cardiovascular complication will be avoided Outcome: Progressing   Problem: Activity: Goal: Risk for activity intolerance will decrease Outcome: Progressing   Problem: Nutrition: Goal: Adequate nutrition will be maintained Outcome: Progressing   Problem: Coping: Goal: Level of anxiety will decrease Outcome: Progressing   Problem: Elimination: Goal: Will not experience complications related to bowel motility Outcome: Progressing Goal: Will not experience complications related to urinary retention Outcome: Progressing   Problem: Pain Managment: Goal: General experience of comfort will improve and/or be controlled Outcome: Progressing   Problem: Safety: Goal: Ability to remain free from injury will improve Outcome: Progressing   Problem: Skin Integrity: Goal: Risk for impaired skin integrity will decrease Outcome: Progressing   Problem: Activity: Goal: Ability to tolerate increased activity will improve Outcome: Progressing   Problem: Clinical Measurements: Goal: Ability to maintain a body temperature in the normal range will improve Outcome: Progressing   Problem: Respiratory: Goal: Ability to maintain adequate ventilation will improve Outcome: Progressing Goal: Ability to maintain a clear airway will improve Outcome: Progressing

## 2023-02-16 NOTE — Progress Notes (Signed)
  Progress Note   Patient: Felicia Benson FMW:[id number] DOB: 03/25/1930 DOA: 02/07/2023     9 DOS: the patient was seen and examined on 02/16/2023   Brief hospital course: Patient is a 89 year old female with osteoarthritis, Crohn's disease, GERD, HTN, AAA, admitted in 10/2022 with right hip fracture after mechanical fall and underwent ORIF, hospitalization in 12/2022 for rectal bleeding suspected to be diverticular bleeding 3 RBC transfusions.  CT angiography did not show active bleed.  Patient presented from SNF with cough, shortness of breath, fatigue, poor appetite for a week.  She was noted to have O2 sats of 90% by the facility on her chronic 4 L O2.  Denied any fevers, leg swelling, chest pain or palpitations. CTA chest showed small segmental pulmonary artery filling defect in the right lower lobe, interstitial and groundglass opacities in the lungs bilaterally, possible edema or infiltrate.  Patchy airspace disease in the lower lobes bilaterally.  Mild bilateral pleural effusions.  Assessment and Plan: * CAP (community acquired pneumonia) - Completed 7 days of antibx  - Tessalon  200 mg PO tid  - Delsym  15 mg PO bid  - Mucinex  600 mg PO bid    COPD (chronic obstructive pulmonary disease) (HCC) - Albuterol  q6 hr PRN  - Dulera  2 puff bid  - Singulair  10 mg PO daily    CKD (chronic kidney disease) - Stable; monitor    Filling defect on imaging study - s/p IVC filter    CAD S/P percutaneous coronary angioplasty - Lipitor 40 mg PO at bedtime - Lasix  40 mg PO daily   - Toprol  XL 25 mg PO daily    Gastrointestinal hemorrhage with melena - Protonix  40 mg PO bid    Crohn's disease (HCC) - Pentasa  1000 mg PO bid    Acute on chronic diastolic HF - Lasix  as above     Subjective: Pt seen and examined at the bedside. She was sleeping this morning during rounds. I suspect if her cough is improved she will be able to discharge either Sunday or Monday depending on the accepting  facility.  Physical Exam: Vitals:   02/15/23 2047 02/16/23 0617 02/16/23 0900 02/16/23 0926  BP: 100/62 (!) 101/59  103/66  Pulse: 81 81  76  Resp: 18 18  18   Temp: 97.7 F (36.5 C) 98 F (36.7 C)  97.7 F (36.5 C)  TempSrc: Oral Oral  Oral  SpO2: 96% 92% 93% 93%  Weight:      Height:       HENT:     Head: Normocephalic.     Mouth/Throat:     Mouth: Mucous membranes are moist.  Cardiovascular:     Rate and Rhythm: Normal rate and regular rhythm.  Pulmonary:     Effort: Pulmonary effort is normal.  Abdominal:     Palpations: Abdomen is soft.  Musculoskeletal:        General: Normal range of motion.     Cervical back: Neck supple.  Skin:    General: Skin is warm.  Neurological:     Mental Status: She is alert. Mental status is at baseline.  Psychiatric:        Mood and Affect: Mood normal.     Disposition: Status is: Inpatient Remains inpatient appropriate because: safe disposition  Planned Discharge Destination: Skilled nursing facility    Time spent: 35 minutes  Author: Dian Laprade , MD 02/16/2023 1:00 PM  For on call review [email or web address].

## 2023-02-16 NOTE — Progress Notes (Signed)
 Rounding Note    Patient Name: Felicia Benson Date of Encounter: 02/16/2023  [facility] Cardiologist: Raford   Subjective   89-year-old female with a history of Crohn's disease, recent GI bleed, ascending aortic aneurysm, COPD, coronary artery disease-status post PCI, status post pacemaker recently admitted with multifocal pneumonia and pulmonary embolism.  Cardiology was consulted for evaluation and management of acute diastolic heart failure.  I/O are + 1.1 litrs I suspect her I'O are not acccurate    Inpatient Medications    Scheduled Meds:  atorvastatin   40 mg Oral QHS   benzonatate   200 mg Oral TID   dextromethorphan   15 mg Oral BID   feeding supplement  237 mL Oral BID BM   furosemide   40 mg Oral Daily   Gerhardt's butt cream   Topical BID   guaiFENesin   600 mg Oral BID   lidocaine   1 patch Transdermal Q24H   melatonin  3 mg Oral QHS   mesalamine   1,000 mg Oral BID   metoprolol  succinate  25 mg Oral Daily   mometasone -formoterol   2 puff Inhalation BID   montelukast   10 mg Oral Daily   pantoprazole   40 mg Oral BID   sodium chloride  flush  3 mL Intravenous Q12H   Continuous Infusions:  PRN Meds: acetaminophen  **OR** acetaminophen , albuterol , HYDROcodone  bit-homatropine, loperamide , meclizine , ondansetron , phenylephrine -shark liver oil-mineral oil-petrolatum    Vital Signs    Vitals:   02/15/23 2047 02/16/23 0617 02/16/23 0900 02/16/23 0926  BP: 100/62 (!) 101/59  103/66  Pulse: 81 81  76  Resp: 18 18  18   Temp: 97.7 F (36.5 C) 98 F (36.7 C)  97.7 F (36.5 C)  TempSrc: Oral Oral  Oral  SpO2: 96% 92% 93% 93%  Weight:      Height:        Intake/Output Summary (Last 24 hours) at 02/16/2023 1414 Last data filed at 02/16/2023 0626 Gross per 24 hour  Intake 720 ml  Output 700 ml  Net 20 ml      02/07/2023   10:00 PM 12/28/2022    3:33 PM 11/13/2022    5:00 AM  Last 3 Weights  Weight (lbs) 188 lb 0.8 oz 188 lb 215 lb 2.7 oz  Weight (kg)  85.3 kg 85.276 kg 97.6 kg      Telemetry     - Personally Reviewed  ECG     - Personally Reviewed  Physical Exam   GEN: No acute distress.   Neck: No JVD Cardiac: RRR,  occasional premature beats    Respiratory: Clear to auscultation bilaterally. GI: Soft, nontender, non-distended  MS: No edema; No deformity. Neuro:  Nonfocal  Psych: Normal affect   Labs    High Sensitivity Troponin:   Recent Labs  Lab 02/07/23 2155 02/08/23 0509  TROPONINIHS 23* 23*     Chemistry Recent Labs  Lab 02/14/23 1040 02/15/23 0152 02/16/23 0500  NA 135 131* 134*  K 3.4* 4.2 4.6  CL 99 99 99  CO2 27 25 27   GLUCOSE 119* 138* 92  BUN 24* 24* 26*  CREATININE 1.31* 1.24* 1.19*  CALCIUM  8.0* 7.7* 7.9*  MG  --   --  1.7  GFRNONAA 39* 41* 43*  ANIONGAP 9 7 8     Lipids No results for input(s): CHOL, TRIG, HDL, LABVLDL, LDLCALC, CHOLHDL in the last 168 hours.  Hematology Recent Labs  Lab 02/12/23 0403 02/13/23 0415 02/14/23 0411  WBC 6.0 6.1 6.2  RBC 3.01* 3.05*  3.01*  HGB 8.9* 8.8* 8.8*  HCT 28.5* 28.7* 28.0*  MCV 94.7 94.1 93.0  MCH 29.6 28.9 29.2  MCHC 31.2 30.7 31.4  RDW 19.1* 18.6* 17.9*  PLT 220 230 217   Thyroid No results for input(s): TSH, FREET4 in the last 168 hours.  BNPNo results for input(s): BNP, PROBNP in the last 168 hours.  DDimer No results for input(s): DDIMER in the last 168 hours.   Radiology    No results found.  Cardiac Studies     Patient Profile     89 y.o. female   Assessment & Plan    1.  Acute on chronic diastolic congestive heart failure:  cont current meds.   2.  Acute pulmonary embolus: She is status post small subsubsegmental pulmonary embolus in the right lower lobe.  This was complicated by recent GI bleed.  She has had an IVC filter placed.  She has had RV dysfunction and developed acute kidney injury. Lasix  was resumed today.  3.  History of coronary artery disease-no angina  4.  Multifocal  pneumonia: Plans per primary team   Time spent on patient care 20 minutes.        For questions or updates, please contact [facility] Please consult [email or web address] for contact info under        Signed, Aleene Passe, MD  02/16/2023, 2:14 PM

## 2023-02-17 DIAGNOSIS — I5033 Acute on chronic diastolic (congestive) heart failure: Secondary | ICD-10-CM | POA: Diagnosis not present

## 2023-02-17 LAB — BASIC METABOLIC PANEL
Anion gap: 8 (ref 5–15)
BUN: 25 mg/dL — ABNORMAL HIGH (ref 8–23)
CO2: 26 mmol/L (ref 22–32)
Calcium: 7.8 mg/dL — ABNORMAL LOW (ref 8.9–10.3)
Chloride: 99 mmol/L (ref 98–111)
Creatinine, Ser: 1.11 mg/dL — ABNORMAL HIGH (ref 0.44–1.00)
GFR, Estimated: 47 mL/min — ABNORMAL LOW (ref >60)
Glucose, Bld: 107 mg/dL — ABNORMAL HIGH (ref 70–99)
Potassium: 4.4 mmol/L (ref 3.5–5.1)
Sodium: 133 mmol/L — ABNORMAL LOW (ref 135–145)

## 2023-02-17 LAB — PHOSPHORUS: Phosphorus: 3.7 mg/dL (ref 2.5–4.6)

## 2023-02-17 LAB — C-REACTIVE PROTEIN: CRP: 4.9 mg/dL — ABNORMAL HIGH (ref <1.0)

## 2023-02-17 LAB — MAGNESIUM: Magnesium: 1.7 mg/dL (ref 1.7–2.4)

## 2023-02-17 NOTE — Progress Notes (Signed)
 Rounding Note    Patient Name: Felicia Benson Date of Encounter: 02/17/2023  [facility] Cardiologist: Raford   Subjective   89-year-old female with a history of Crohn's disease, recent GI bleed, ascending aortic aneurysm, COPD, coronary artery disease-status post PCI, status post pacemaker recently admitted with multifocal pneumonia and pulmonary embolism.  Cardiology was consulted for evaluation and management of acute diastolic heart failure.  I/O are not accurate     Inpatient Medications    Scheduled Meds:  atorvastatin   40 mg Oral QHS   benzonatate   200 mg Oral TID   dextromethorphan   15 mg Oral BID   feeding supplement  237 mL Oral BID BM   furosemide   40 mg Oral Daily   Gerhardt's butt cream   Topical BID   guaiFENesin   600 mg Oral BID   lidocaine   1 patch Transdermal Q24H   melatonin  3 mg Oral QHS   mesalamine   1,000 mg Oral BID   metoprolol  succinate  25 mg Oral Daily   mometasone -formoterol   2 puff Inhalation BID   montelukast   10 mg Oral Daily   pantoprazole   40 mg Oral BID   sodium chloride  flush  3 mL Intravenous Q12H   Continuous Infusions:  PRN Meds: acetaminophen  **OR** acetaminophen , albuterol , HYDROcodone  bit-homatropine, loperamide , meclizine , ondansetron , phenylephrine -shark liver oil-mineral oil-petrolatum    Vital Signs    Vitals:   02/16/23 2046 02/17/23 0527 02/17/23 0841 02/17/23 1440  BP: 113/70 98/62 107/66 99/66  Pulse: 78 78 85 96  Resp: 16 18 18    Temp: 97.8 F (36.6 C) 97.7 F (36.5 C) 98.4 F (36.9 C) 98.4 F (36.9 C)  TempSrc: Oral Oral Oral Oral  SpO2: 97% 96% 98% 95%  Weight:      Height:        Intake/Output Summary (Last 24 hours) at 02/17/2023 1541 Last data filed at 02/17/2023 1439 Gross per 24 hour  Intake 1560 ml  Output 1676 ml  Net -116 ml      02/07/2023   10:00 PM 12/28/2022    3:33 PM 11/13/2022    5:00 AM  Last 3 Weights  Weight (lbs) 188 lb 0.8 oz 188 lb 215 lb 2.7 oz  Weight (kg) 85.3  kg 85.276 kg 97.6 kg      Telemetry     V pacing - Personally Reviewed  ECG     - Personally Reviewed  Physical Exam   Physical Exam: Blood pressure 99/66, pulse 96, temperature 98.4 F (36.9 C), temperature source Oral, resp. rate 18, height 5' 6 (1.676 m), weight 85.3 kg, SpO2 95%.       GEN:  elderly female,  in no acute distress HEENT: Normal NECK: No JVD; No carotid bruits LYMPHATICS: No lymphadenopathy CARDIAC:  RR  RESPIRATORY:  Clear to auscultation without rales, wheezing or rhonchi  ABDOMEN: Soft, non-tender, non-distended MUSCULOSKELETAL:  No edema; No deformity  SKIN: Warm and dry NEUROLOGIC:  Alert and oriented x 3   Labs    High Sensitivity Troponin:   Recent Labs  Lab 02/07/23 2155 02/08/23 0509  TROPONINIHS 23* 23*     Chemistry Recent Labs  Lab 02/15/23 0152 02/16/23 0500 02/17/23 0209  NA 131* 134* 133*  K 4.2 4.6 4.4  CL 99 99 99  CO2 25 27 26   GLUCOSE 138* 92 107*  BUN 24* 26* 25*  CREATININE 1.24* 1.19* 1.11*  CALCIUM  7.7* 7.9* 7.8*  MG  --  1.7 1.7  GFRNONAA 41* 43*  47*  ANIONGAP 7 8 8     Lipids No results for input(s): CHOL, TRIG, HDL, LABVLDL, LDLCALC, CHOLHDL in the last 168 hours.  Hematology Recent Labs  Lab 02/12/23 0403 02/13/23 0415 02/14/23 0411  WBC 6.0 6.1 6.2  RBC 3.01* 3.05* 3.01*  HGB 8.9* 8.8* 8.8*  HCT 28.5* 28.7* 28.0*  MCV 94.7 94.1 93.0  MCH 29.6 28.9 29.2  MCHC 31.2 30.7 31.4  RDW 19.1* 18.6* 17.9*  PLT 220 230 217   Thyroid No results for input(s): TSH, FREET4 in the last 168 hours.  BNPNo results for input(s): BNP, PROBNP in the last 168 hours.  DDimer No results for input(s): DDIMER in the last 168 hours.   Radiology    No results found.  Cardiac Studies     Patient Profile     89 y.o. female   Assessment & Plan    1.  Acute on chronic diastolic congestive heart failure:  cont current meds.   She is urinating quite a bit now    2.  Acute pulmonary  embolus: She is status post small subsubsegmental pulmonary embolus in the right lower lobe.  This was complicated by recent GI bleed.  She has had an IVC filter placed. Last Hb was 8.8 on Feb. 6.     She has had RV dysfunction and developed acute kidney injury. Lasix  was resumed today.  3.  History of coronary artery disease-no angina  4.  Multifocal pneumonia: Plans per primary team   Time spent on patient care 20  minutes.        For questions or updates, please contact [facility] Please consult [email or web address] for contact info under        Signed, Aleene Passe, MD  02/17/2023, 3:41 PM

## 2023-02-17 NOTE — Plan of Care (Signed)
  Problem: Clinical Measurements: Goal: Will remain free from infection Outcome: Progressing   Problem: Clinical Measurements: Goal: Diagnostic test results will improve Outcome: Progressing   Problem: Clinical Measurements: Goal: Respiratory complications will improve Outcome: Progressing   Problem: Clinical Measurements: Goal: Cardiovascular complication will be avoided Outcome: Progressing   Problem: Activity: Goal: Risk for activity intolerance will decrease Outcome: Progressing   Problem: Nutrition: Goal: Adequate nutrition will be maintained Outcome: Progressing

## 2023-02-17 NOTE — Progress Notes (Signed)
  Progress Note   Patient: Felicia Benson FMW:[id number] DOB: August 15, 1930 DOA: 02/07/2023     10 DOS: the patient was seen and examined on 02/17/2023   Brief hospital course: Patient is a 89 year old female with osteoarthritis, Crohn's disease, GERD, HTN, AAA, admitted in 10/2022 with right hip fracture after mechanical fall and underwent ORIF, hospitalization in 12/2022 for rectal bleeding suspected to be diverticular bleeding 3 RBC transfusions.  CT angiography did not show active bleed.  Patient presented from SNF with cough, shortness of breath, fatigue, poor appetite for a week.  She was noted to have O2 sats of 90% by the facility on her chronic 4 L O2.  Denied any fevers, leg swelling, chest pain or palpitations. CTA chest showed small segmental pulmonary artery filling defect in the right lower lobe, interstitial and groundglass opacities in the lungs bilaterally, possible edema or infiltrate.  Patchy airspace disease in the lower lobes bilaterally.  Mild bilateral pleural effusions.  Assessment and Plan: * CAP (community acquired pneumonia) - Completed 7 days of antibx  - Tessalon  200 mg PO tid  - Delsym  15 mg PO bid  - Mucinex  600 mg PO bid    COPD (chronic obstructive pulmonary disease) (HCC) - Albuterol  q6 hr PRN  - Dulera  2 puff bid  - Singulair  10 mg PO daily    CKD (chronic kidney disease) - Stable; monitor    Filling defect on imaging study - s/p IVC filter    CAD S/P percutaneous coronary angioplasty - Lipitor 40 mg PO at bedtime - Lasix  40 mg PO daily   - Toprol  XL 25 mg PO daily    Gastrointestinal hemorrhage with melena - Protonix  40 mg PO bid    Crohn's disease (HCC) - Pentasa  1000 mg PO bid    Acute on chronic diastolic HF - Lasix  as above    Subjective: Pt seen and examined at the bedside. Pain is improved with the lidocaine  patch. Appreciate cardiology following. Likely dc back to facility tmr.  Physical Exam: Vitals:   02/16/23 2001 02/16/23 2046  02/17/23 0527 02/17/23 0841  BP:  113/70 98/62 107/66  Pulse: 87 78 78 85  Resp:  16 18 18   Temp:  97.8 F (36.6 C) 97.7 F (36.5 C) 98.4 F (36.9 C)  TempSrc:  Oral Oral Oral  SpO2: 92% 97% 96% 98%  Weight:      Height:       HENT:     Head: Normocephalic.     Mouth/Throat:     Mouth: Mucous membranes are moist.  Cardiovascular:     Rate and Rhythm: Normal rate and regular rhythm.  Pulmonary:     Effort: Pulmonary effort is normal.  Abdominal:     Palpations: Abdomen is soft.  Musculoskeletal:        General: Normal range of motion.     Cervical back: Neck supple.  Skin:    General: Skin is warm.  Neurological:     Mental Status: She is alert. Mental status is at baseline.  Psychiatric:        Mood and Affect: Mood normal.     Disposition: Status is: Inpatient Remains inpatient appropriate because: awaiting safe dispo  Planned Discharge Destination: Skilled nursing facility    Time spent: 35 minutes  Author: Makaila Windle , MD 02/17/2023 11:01 AM  For on call review [email or web address].

## 2023-02-18 DIAGNOSIS — I5032 Chronic diastolic (congestive) heart failure: Secondary | ICD-10-CM | POA: Diagnosis not present

## 2023-02-18 DIAGNOSIS — I2510 Atherosclerotic heart disease of native coronary artery without angina pectoris: Secondary | ICD-10-CM | POA: Diagnosis not present

## 2023-02-18 DIAGNOSIS — I5033 Acute on chronic diastolic (congestive) heart failure: Secondary | ICD-10-CM | POA: Diagnosis not present

## 2023-02-18 LAB — BASIC METABOLIC PANEL
Anion gap: 11 (ref 5–15)
BUN: 25 mg/dL — ABNORMAL HIGH (ref 8–23)
CO2: 28 mmol/L (ref 22–32)
Calcium: 7.9 mg/dL — ABNORMAL LOW (ref 8.9–10.3)
Chloride: 94 mmol/L — ABNORMAL LOW (ref 98–111)
Creatinine, Ser: 1.21 mg/dL — ABNORMAL HIGH (ref 0.44–1.00)
GFR, Estimated: 43 mL/min — ABNORMAL LOW (ref >60)
Glucose, Bld: 110 mg/dL — ABNORMAL HIGH (ref 70–99)
Potassium: 3.9 mmol/L (ref 3.5–5.1)
Sodium: 133 mmol/L — ABNORMAL LOW (ref 135–145)

## 2023-02-18 LAB — MAGNESIUM: Magnesium: 1.6 mg/dL — ABNORMAL LOW (ref 1.7–2.4)

## 2023-02-18 LAB — PHOSPHORUS: Phosphorus: 3.3 mg/dL (ref 2.5–4.6)

## 2023-02-18 MED ORDER — MAGNESIUM SULFATE 4 GM/100ML IV SOLN
4.0000 g | Freq: Once | INTRAVENOUS | Status: AC
  Administered 2023-02-18: 4 g via INTRAVENOUS
  Filled 2023-02-18: qty 100

## 2023-02-18 NOTE — Progress Notes (Signed)
 Physical Therapy Treatment Patient Details Name: Felicia Benson MRN: [id number] DOB: 09-13-1930 Today's Date: 02/18/2023   History of Present Illness Pt is a 89 y/o female presented with cough, SOB and decrease in appetite for the last week. Pt found to have CAP, acute PE, subsegmental PE LLE DVT. Pt s/p IVC placement on 2/3.  PMH includes: chronic hypoxic respiratory failure on baseline and 4 L continuous nasal cannula, COPD, chronic diastolic heart failure, essential pretension, hyperlipidemia anemia of chronic disease is a baseline hemoglobin range 10-12, ORIF RLE (10/24), rectal bleeding with 3RBC transfusions (12/24)    PT Comments  Continuing work on functional mobility and activity tolerance; session focused on progressive ambulation, and we opted to support patient on supplemental O2 during physical activity; patient needed moderate assist to stand from edge of bed, but men assist to stand from the recliner with the sheet height bolstered; bilateral armrests are proving to be very useful for her to push up to stand; performed two bouts of walking with the rolling walker, 10 feet each, with a seated rest break in between bouts; continue to recommend post acute rehab to maximize independence and safety with mobility and ADLs in prep for getting back home    If plan is discharge home, recommend the following: A little help with walking and/or transfers;A little help with bathing/dressing/bathroom;Assistance with cooking/housework;Assist for transportation;Help with stairs or ramp for entrance   Can travel by private vehicle     Yes  Equipment Recommendations  Rolling walker (2 wheels);BSC/3in1    Recommendations for Other Services       Precautions / Restrictions Precautions Precautions: Fall     Mobility  Bed Mobility Overal bed mobility: Needs Assistance Bed Mobility: Supine to Sit     Supine to sit: Min assist, Used rails     General bed mobility comments: Pt initiating  movement well. Assist to complete transition fully around to EOB.    Transfers Overall transfer level: Needs assistance Equipment used: Rolling walker (2 wheels) Transfers: Sit to/from Stand Sit to Stand: Mod assist, Min assist           General transfer comment: Mod assist to stand from slightly elevated bed, first rep; After that, min assist to stand from recliner with seat height built up (3 folded bedspreads); 5 sit<>stands    Ambulation/Gait Ambulation/Gait assistance: Contact guard assist Gait Distance (Feet): 22 Feet (11+11) Assistive device: Rolling walker (2 wheels) Gait Pattern/deviations: Step-through pattern, Decreased stride length, Trunk flexed       General Gait Details: 2 bouts of amb, getting to teh doorway to the hallway; pulled chair behind pt; one seated rest break   Stairs             Wheelchair Mobility     Tilt Bed    Modified Rankin (Stroke Patients Only)       Balance     Sitting balance-Leahy Scale: Good       Standing balance-Leahy Scale: Poor                              Cognition Arousal: Alert Behavior During Therapy: WFL for tasks assessed/performed Overall Cognitive Status: Within Functional Limits for tasks assessed  Exercises      General Comments General comments (skin integrity, edema, etc.): Assisted pt in donning the brief she brought from home; opted to supoprt pt on supplemental O2 during session, 1L      Pertinent Vitals/Pain Pain Assessment Pain Assessment: No/denies pain    Home Living                          Prior Function            PT Goals (current goals can now be found in the care plan section) Acute Rehab PT Goals Patient Stated Goal: Be able to eventually return home PT Goal Formulation: With patient Time For Goal Achievement: 02/27/23 Potential to Achieve Goals: Good Progress towards PT goals:  Progressing toward goals    Frequency    Min 1X/week      PT Plan      Co-evaluation              AM-PAC PT "6 Clicks" Mobility   Outcome Measure  Help needed turning from your back to your side while in a flat bed without using bedrails?: A Little Help needed moving from lying on your back to sitting on the side of a flat bed without using bedrails?: A Little Help needed moving to and from a bed to a chair (including a wheelchair)?: A Little Help needed standing up from a chair using your arms (e.g., wheelchair or bedside chair)?: A Little Help needed to walk in hospital room?: A Little Help needed climbing 3-5 steps with a railing? : A Little 6 Click Score: 18    End of Session Equipment Utilized During Treatment: Gait belt;Oxygen Activity Tolerance: Patient tolerated treatment well Patient left: in chair;with call bell/phone within reach;with nursing/sitter in room Nurse Communication: Mobility status PT Visit Diagnosis: Unsteadiness on feet (R26.81);Difficulty in walking, not elsewhere classified (R26.2)     Time: 1610-9604 PT Time Calculation (min) (ACUTE ONLY): 44 min  Charges:    $Gait Training: 23-37 mins $Therapeutic Activity: 8-22 mins PT General Charges $$ ACUTE PT VISIT: 1 Visit                     Darcus Eastern, PT  Acute Rehabilitation Services Office [phone] Secure Chat welcomed    Marcial Setting 02/18/2023, 2:21 PM

## 2023-02-18 NOTE — Progress Notes (Signed)
 Progress Note  Patient Name: Felicia Benson Date of Encounter: 02/18/2023  Primary Cardiologist: None   Subjective   Patient seen examined her bedside.  She had a few questions about time of discharge.  Defer to the primary team.  Inpatient Medications    Scheduled Meds:  atorvastatin   40 mg Oral QHS   benzonatate   200 mg Oral TID   dextromethorphan   15 mg Oral BID   feeding supplement  237 mL Oral BID BM   furosemide   40 mg Oral Daily   Gerhardt's butt cream   Topical BID   guaiFENesin   600 mg Oral BID   lidocaine   1 patch Transdermal Q24H   melatonin  3 mg Oral QHS   mesalamine   1,000 mg Oral BID   metoprolol  succinate  25 mg Oral Daily   mometasone -formoterol   2 puff Inhalation BID   montelukast   10 mg Oral Daily   pantoprazole   40 mg Oral BID   sodium chloride  flush  3 mL Intravenous Q12H   Continuous Infusions:  magnesium  sulfate bolus IVPB 4 g (02/18/23 1045)   PRN Meds: acetaminophen  **OR** acetaminophen , albuterol , HYDROcodone  bit-homatropine, loperamide , meclizine , ondansetron , phenylephrine -shark liver oil-mineral oil-petrolatum    Vital Signs    Vitals:   02/18/23 0220 02/18/23 0621 02/18/23 0745 02/18/23 0814  BP: (!) 98/58 102/66 107/74   Pulse: 85 76 77   Resp: 18 18    Temp: 98.4 F (36.9 C) (!) 97.5 F (36.4 C)    TempSrc:  Oral    SpO2: 96% 97% 98% 96%  Weight:      Height:        Intake/Output Summary (Last 24 hours) at 02/18/2023 1153 Last data filed at 02/18/2023 0636 Gross per 24 hour  Intake 720 ml  Output 850 ml  Net -130 ml   Filed Weights   02/07/23 2200  Weight: 85.3 kg    Telemetry     - Personally Reviewed  ECG     - Personally Reviewed  Physical Exam   General: Comfortable, Head: Atraumatic, normal size  Eyes: PEERLA, EOMI  Neck: Supple, normal JVD Cardiac: Normal S1, S2; RRR; no murmurs, rubs, or gallops Lungs: Clear to auscultation bilaterally Abd: Soft, nontender, no hepatomegaly  Ext: warm, +1  bilateral lower extremity edema Musculoskeletal: No deformities, BUE and BLE strength normal and equal Skin: Warm and dry, no rashes   Neuro: Alert and oriented to person, place, time, and situation, CNII-XII grossly intact, no focal deficits  Psych: Normal mood and affect   Labs    Chemistry Recent Labs  Lab 02/16/23 0500 02/17/23 0209 02/18/23 0400  NA 134* 133* 133*  K 4.6 4.4 3.9  CL 99 99 94*  CO2 27 26 28   GLUCOSE 92 107* 110*  BUN 26* 25* 25*  CREATININE 1.19* 1.11* 1.21*  CALCIUM  7.9* 7.8* 7.9*  GFRNONAA 43* 47* 43*  ANIONGAP 8 8 11      Hematology Recent Labs  Lab 02/12/23 0403 02/13/23 0415 02/14/23 0411  WBC 6.0 6.1 6.2  RBC 3.01* 3.05* 3.01*  HGB 8.9* 8.8* 8.8*  HCT 28.5* 28.7* 28.0*  MCV 94.7 94.1 93.0  MCH 29.6 28.9 29.2  MCHC 31.2 30.7 31.4  RDW 19.1* 18.6* 17.9*  PLT 220 230 217    Cardiac EnzymesNo results for input(s): "TROPONINI" in the last 168 hours. No results for input(s): "TROPIPOC" in the last 168 hours.   BNPNo results for input(s): "BNP", "PROBNP" in the last 168 hours.  DDimer No results for input(s): "DDIMER" in the last 168 hours.   Radiology    No results found.  Cardiac Studies   Prior echo  Patient Profile     89 y.o. female with a history of Crohn's disease, recent GI bleed, ascending aortic aneurysm, COPD, coronary artery disease-status post PCI, status post pacemaker recently admitted with multifocal pneumonia and pulmonary embolism.   Assessment & Plan    Acute on chronic diastolic heart failure Acute pulmonary embolism History of coronary artery disease Multifocal pneumonia   Slightly trending upward.  Will continue to monitor.  Do think that we can keep her on her Lasix  for now and monitor the creatinine. Recent IVC filter placement.  Noted small subsegmental pulmonary embolus in the right lobe.  Due to recent GI bleed not on anticoagulation still anemic though.  CAD-no anginal symptoms  Pneumonia has  been treated by the primary team.      For questions or updates, please contact [facility] Please consult [email or web address] for contact info under Cardiology/STEMI.      Signed, Wyndell Cardiff, DO  02/18/2023, 11:53 AM

## 2023-02-18 NOTE — Progress Notes (Signed)
 Progress Note   Patient: Felicia Benson AOZ:[id number] DOB: January 22, 1930 DOA: 02/07/2023     11 DOS: the patient was seen and examined on 02/18/2023   Brief hospital course: Patient is a 89 year old female with osteoarthritis, Crohn's disease, GERD, HTN, AAA, admitted in 10/2022 with right hip fracture after mechanical fall and underwent ORIF, hospitalization in 12/2022 for rectal bleeding suspected to be diverticular bleeding 3 RBC transfusions.  CT angiography did not show active bleed.  Patient presented from SNF with cough, shortness of breath, fatigue, poor appetite for a week.  She was noted to have O2 sats of 90% by the facility on her chronic 4 L O2.  Denied any fevers, leg swelling, chest pain or palpitations. CTA chest showed small segmental pulmonary artery filling defect in the right lower lobe, interstitial and groundglass opacities in the lungs bilaterally, possible edema or infiltrate.  Patchy airspace disease in the lower lobes bilaterally.  Mild bilateral pleural effusions.  Assessment and Plan: * CAP (community acquired pneumonia) - Completed 7 days of antibx  - Tessalon  200 mg PO tid  - Delsym  15 mg PO bid  - Mucinex  600 mg PO bid    COPD (chronic obstructive pulmonary disease) (HCC) - Albuterol  q6 hr PRN  - Dulera  2 puff bid  - Singulair  10 mg PO daily    CKD (chronic kidney disease) - Stable; monitor    Filling defect on imaging study - s/p IVC filter    CAD S/P percutaneous coronary angioplasty - Lipitor 40 mg PO at bedtime - Lasix  40 mg PO daily   - Toprol  XL 25 mg PO daily    Gastrointestinal hemorrhage with melena - Protonix  40 mg PO bid    Crohn's disease (HCC) - Pentasa  1000 mg PO bid    Acute on chronic diastolic HF - Lasix  as above   Subjective: Pt seen and examined at the bedside. Pt reports she is not ready to go back to her facility today because of her ongoing cough and the fact that the lasix  is making her urinate too much and she thinks it is  affecting her blood pressure. She advised she may be ready to go back tmr.  Physical Exam: Vitals:   02/18/23 0220 02/18/23 0621 02/18/23 0745 02/18/23 0814  BP: (!) 98/58 102/66 107/74   Pulse: 85 76 77   Resp: 18 18    Temp: 98.4 F (36.9 C) (!) 97.5 F (36.4 C)    TempSrc:  Oral    SpO2: 96% 97% 98% 96%  Weight:      Height:       HENT:     Head: Normocephalic.     Mouth/Throat:     Mouth: Mucous membranes are moist.  Cardiovascular:     Rate and Rhythm: Normal rate and regular rhythm.  Pulmonary:     Effort: Pulmonary effort is normal.  Abdominal:     Palpations: Abdomen is soft.  Musculoskeletal:        General: Normal range of motion.     Cervical back: Neck supple.  Skin:    General: Skin is warm.  Neurological:     Mental Status: She is alert. Mental status is at baseline.  Psychiatric:        Mood and Affect: Mood normal.      Disposition: Status is: Inpatient Remains inpatient appropriate because: awaiting safe disposition   Planned Discharge Destination: Skilled nursing facility    Time spent: 35 minutes  Author: Kaycee Haycraft ,  MD 02/18/2023 11:40 AM  For on call review [email or web address].

## 2023-02-18 NOTE — Progress Notes (Signed)
 Mobility Specialist Progress Note:    02/18/23 1300  Mobility  Activity Transferred from chair to bed  Level of Assistance Minimal assist, patient does 75% or more  Assistive Device Front wheel walker  Distance Ambulated (ft) 4 ft  Activity Response Tolerated well  Mobility Referral Yes  Mobility visit 1 Mobility  Mobility Specialist Start Time (ACUTE ONLY) 1252  Mobility Specialist Stop Time (ACUTE ONLY) 1306  Mobility Specialist Time Calculation (min) (ACUTE ONLY) 14 min   Pt received in chair, requesting assistance to bed. Able to stand on first attempt w/ minA. No complaints throughout. Pt left in bed with call bell and all needs met. Bed alarm on.  D'Vante Nolon Baxter Mobility Specialist Please contact via Special educational needs teacher or Rehab office at [phone]

## 2023-02-19 DIAGNOSIS — I5033 Acute on chronic diastolic (congestive) heart failure: Secondary | ICD-10-CM | POA: Diagnosis not present

## 2023-02-19 LAB — BASIC METABOLIC PANEL
Anion gap: 6 (ref 5–15)
BUN: 24 mg/dL — ABNORMAL HIGH (ref 8–23)
CO2: 30 mmol/L (ref 22–32)
Calcium: 7.7 mg/dL — ABNORMAL LOW (ref 8.9–10.3)
Chloride: 97 mmol/L — ABNORMAL LOW (ref 98–111)
Creatinine, Ser: 1.24 mg/dL — ABNORMAL HIGH (ref 0.44–1.00)
GFR, Estimated: 41 mL/min — ABNORMAL LOW (ref >60)
Glucose, Bld: 98 mg/dL (ref 70–99)
Potassium: 4.4 mmol/L (ref 3.5–5.1)
Sodium: 133 mmol/L — ABNORMAL LOW (ref 135–145)

## 2023-02-19 LAB — MAGNESIUM: Magnesium: 2.1 mg/dL (ref 1.7–2.4)

## 2023-02-19 LAB — PHOSPHORUS: Phosphorus: 3.5 mg/dL (ref 2.5–4.6)

## 2023-02-19 MED ORDER — METOPROLOL SUCCINATE ER 25 MG PO TB24: 25.0000 mg | ORAL_TABLET | Freq: Every day | ORAL | 0 refills | Status: DC

## 2023-02-19 MED ORDER — DEXTROMETHORPHAN POLISTIREX ER 30 MG/5ML PO SUER: 15.0000 mg | Freq: Two times a day (BID) | ORAL | 0 refills | Status: AC

## 2023-02-19 MED ORDER — BENZONATATE 200 MG PO CAPS: 200.0000 mg | ORAL_CAPSULE | Freq: Three times a day (TID) | ORAL | 0 refills | Status: AC | PRN

## 2023-02-19 MED ORDER — FUROSEMIDE 40 MG PO TABS: 40.0000 mg | ORAL_TABLET | Freq: Every day | ORAL | 0 refills | Status: DC

## 2023-02-19 MED ORDER — LIDOCAINE 5 % EX PTCH: 1.0000 | MEDICATED_PATCH | CUTANEOUS | 0 refills | Status: AC

## 2023-02-19 NOTE — Plan of Care (Signed)
  Problem: Health Behavior/Discharge Planning: Goal: Ability to manage health-related needs will improve Outcome: Adequate for Discharge   Problem: Clinical Measurements: Goal: Will remain free from infection Outcome: Adequate for Discharge Goal: Diagnostic test results will improve Outcome: Adequate for Discharge Goal: Respiratory complications will improve Outcome: Adequate for Discharge Goal: Cardiovascular complication will be avoided Outcome: Adequate for Discharge   Problem: Activity: Goal: Risk for activity intolerance will decrease Outcome: Adequate for Discharge   Problem: Nutrition: Goal: Adequate nutrition will be maintained Outcome: Adequate for Discharge   Problem: Coping: Goal: Level of anxiety will decrease Outcome: Adequate for Discharge   Problem: Elimination: Goal: Will not experience complications related to bowel motility Outcome: Adequate for Discharge Goal: Will not experience complications related to urinary retention Outcome: Adequate for Discharge   Problem: Pain Managment: Goal: General experience of comfort will improve and/or be controlled Outcome: Adequate for Discharge   Problem: Safety: Goal: Ability to remain free from injury will improve Outcome: Adequate for Discharge   Problem: Skin Integrity: Goal: Risk for impaired skin integrity will decrease Outcome: Adequate for Discharge   Problem: Activity: Goal: Ability to tolerate increased activity will improve Outcome: Adequate for Discharge   Problem: Clinical Measurements: Goal: Ability to maintain a body temperature in the normal range will improve Outcome: Adequate for Discharge   Problem: Respiratory: Goal: Ability to maintain adequate ventilation will improve Outcome: Adequate for Discharge Goal: Ability to maintain a clear airway will improve Outcome: Adequate for Discharge

## 2023-02-19 NOTE — Discharge Summary (Signed)
 Physician Discharge Summary   Patient: Felicia Benson MRN: [id number] DOB: 05/06/1930  Admit date:     02/07/2023  Discharge date: 02/19/23  Discharge Physician: Baron Hamper    PCP: [name], MD      Discharge Diagnoses: Principal Problem:   CAP (community acquired pneumonia) Active Problems:   Acute on chronic diastolic heart failure (HCC)   Gastrointestinal hemorrhage with melena   CAD S/P percutaneous coronary angioplasty   Filling defect on imaging study   CKD (chronic kidney disease)   Pneumonia   Acute pulmonary embolism (HCC)   Acute respiratory failure with hypoxia (HCC)   DVT (deep venous thrombosis) (HCC)   Shortness of breath   Right ventricular failure (HCC)  Resolved Problems:   * No resolved hospital problems. *  Hospital Course: 89 year old female with osteoarthritis, Crohn's disease, GERD, HTN, AAA. Patient presented from SNF with cough, shortness of breath, fatigue, poor appetite for a week.  She was noted to have O2 sats of 90% by the facility on her chronic 4 L O2.  Denied any fevers, leg swelling, chest pain or palpitations. CTA chest showed small segmental pulmonary artery filling defect in the right lower lobe, interstitial and groundglass opacities in the lungs bilaterally, possible edema or infiltrate.  Patchy airspace disease in the lower lobes bilaterally.  Mild bilateral pleural effusions.  Pt was treated for CAP and completed 7 days of antibx. She had a persistent cough and required the following -->Tessalon 200 mg PO tid, Delsym 15 mg PO bid and Mucinex 600 mg PO bid.  Pt also received Albuterol q6 hr PRN, Dulera 2 puff bid and Singulair 10 mg PO daily.   Pt noted to have PE and DVT. She was on a heparin drip but her Hgb downtrended from 9.2 to 7.6. Heparin drip was discontinued on 02/10/2023. Pt had an IVC filter placed on 02/11/2023.  Cardiology was also consulted due to acute on chronic diastolic HF. They maintained the pt on lasix 40 mg PO daily  and toprol XL 25 mg PO daily. On 02/19/2023 the pt was feeling ready for discharge back to her facility.   DISCHARGE MEDICATION: Allergies as of 02/19/2023       Reactions   Motrin [ibuprofen] Other (See Comments)   GI bleed   Ace Inhibitors Cough   Breztri Aerosphere [budeson-glycopyrrol-formoterol] Hypertension   Cipro [ciprofloxacin Hcl] Other (See Comments)   Avoid fluoroquinolones due to asc-aortic aneurysm   Levaquin [levofloxacin] Other (See Comments)   Avoid fluoroquinolones due to asc-aortic aneurysm   Nsaids Nausea And Vomiting, Other (See Comments)   Hx of GI bleed        Medication List     TAKE these medications    albuterol 108 (90 Base) MCG/ACT inhaler Commonly known as: VENTOLIN HFA Inhale 2 puffs into the lungs every 6 (six) hours as needed for wheezing or shortness of breath.   aspirin EC 81 MG tablet Take 1 tablet (81 mg total) by mouth daily. Swallow whole.   atorvastatin 40 MG tablet Commonly known as: LIPITOR Take 40 mg by mouth at bedtime.   benzonatate 200 MG capsule Commonly known as: TESSALON Take 1 capsule (200 mg total) by mouth 3 (three) times daily as needed for up to 5 days for cough.   dextromethorphan 30 MG/5ML liquid Commonly known as: DELSYM Take 2.5 mLs (15 mg total) by mouth 2 (two) times daily for 5 days.   docusate sodium 100 MG capsule Commonly known as: COLACE  Take 1 capsule (100 mg total) by mouth daily.   Dulera 200-5 MCG/ACT Aero Generic drug: mometasone-formoterol Inhale 2 puffs into the lungs 2 (two) times daily.   ferrous sulfate 325 (65 FE) MG tablet Take 325 mg by mouth daily.   furosemide 40 MG tablet Commonly known as: LASIX Take 1 tablet (40 mg total) by mouth daily.   lidocaine 5 % Commonly known as: LIDODERM Place 1 patch onto the skin daily for 5 days. Remove & Discard patch within 12 hours or as directed by MD. Cut in half and place 1/2 near each knee for arthritis pain   metoprolol succinate 25 MG 24  hr tablet Commonly known as: TOPROL-XL Take 1 tablet (25 mg total) by mouth daily.   montelukast 10 MG tablet Commonly known as: SINGULAIR Take 1 tablet (10 mg total) by mouth daily.   pantoprazole 40 MG tablet Commonly known as: PROTONIX Take 40 mg by mouth in the morning and at bedtime.   Pentasa 500 MG CR capsule Generic drug: mesalamine Take 1,000 mg by mouth in the morning and at bedtime.   potassium chloride SA 20 MEQ tablet Commonly known as: KLOR-CON M Take 40 mEq by mouth daily.   VITAMIN D-3 PO Take 1 capsule by mouth daily.        Contact information for after-discharge care     Destination     [facility] .   Service: Skilled Nursing Contact information: [address] [place] [address] [phone]                    Discharge Exam: Ceasar Mons Weights   02/07/23 2200  Weight: 85.3 kg   HENT:     Head: Normocephalic.     Mouth/Throat:     Mouth: Mucous membranes are moist.  Cardiovascular:     Rate and Rhythm: Normal rate and regular rhythm.  Pulmonary:     Effort: Pulmonary effort is normal.  Abdominal:     Palpations: Abdomen is soft.  Musculoskeletal:        General: Normal range of motion.     Cervical back: Neck supple.  Skin:    General: Skin is warm.  Neurological:     Mental Status: She is alert. Mental status is at baseline.  Psychiatric:        Mood and Affect: Mood normal.   Condition at discharge: fair  The results of significant diagnostics from this hospitalization (including imaging, microbiology, ancillary and laboratory) are listed below for reference.   Imaging Studies: IR IVC FILTER PLMT / S&I Lenise Arena GUID/MOD SED Result Date: 02/11/2023 INDICATION: LEFT lower extremity DVT.  GI bleed.  Unable to anticoagulate. EXAM: Procedures: 1. CENTRAL VENOGRAM 2. INFERIOR VENA CAVA FILTER PLACEMENT COMPARISON:  CT AP 02/09/2018.  CTA abdomen pelvis, 12/30/2022. MEDICATIONS: None. ANESTHESIA/SEDATION:  Local anesthetic was administered. The patient was continuously monitored during the procedure by the interventional radiology nurse under my direct supervision. CONTRAST:  35 mL Omnipaque 100 FLUOROSCOPY TIME:  Fluoroscopic dose; 31 mGy COMPLICATIONS: None immediate PROCEDURE: Informed written consent was obtained from the patient and/or patient's representative following explanation of the procedure, risks, benefits and alternatives. A time out was performed prior to the initiation of the procedure. Maximal barrier sterile technique utilized including caps, mask, sterile gowns, sterile gloves, large sterile drape, hand hygiene, and sterile prep. Under sterile condition and local anesthesia, RIGHT internal jugular venous access was performed with ultrasound. An ultrasound image was saved and sent to  PACS. Over a guidewire, the IVC filter delivery sheath and inner dilator were advanced into the IVC just above the IVC bifurcation. Contrast injection was performed for an IVC venogram. Through the delivery sheath, a retrievable Denali IVC filter was deployed below the level of the renal veins and above the IVC bifurcation. Limited post deployment venacavagram was performed. The delivery sheath was removed and hemostasis was obtained with manual compression. A dressing was placed. The patient tolerated the procedure well without immediate post procedural complication. FINDINGS: The IVC is patent. No evidence of thrombus, stenosis, or occlusion. No variant venous anatomy. IMPRESSION: Successful placement of a retrievable infrarenal inferior vena cava (IVC) filter, via a transjugular approach. PLAN: IVC filters can cause complications when left in place for extended periods of time. If medically appropriate, recommend discontinuing filter prior to discharge. Please re-evaluate the patient for filter discontinuation when they are seen in follow up, and refer patient to Interventional Radiology for removal. Roanna Banning, MD  [facility] Radiology Electronically Signed   By: Roanna Banning M.D.   On: 02/11/2023 16:43   CT ABDOMEN PELVIS WO CONTRAST Result Date: 02/10/2023 CLINICAL DATA:  Acute generalized abdominal pain. EXAM: CT ABDOMEN AND PELVIS WITHOUT CONTRAST TECHNIQUE: Multidetector CT imaging of the abdomen and pelvis was performed following the standard protocol without IV contrast. RADIATION DOSE REDUCTION: This exam was performed according to the departmental dose-optimization program which includes automated exposure control, adjustment of the mA and/or kV according to patient size and/or use of iterative reconstruction technique. COMPARISON:  December 28, 2022. FINDINGS: Lower chest: Small bilateral pleural effusions are noted with adjacent subsegmental atelectasis, right greater than left. Hepatobiliary: No focal liver abnormality is seen. Status post cholecystectomy. No biliary dilatation. Pancreas: Unremarkable. No pancreatic ductal dilatation or surrounding inflammatory changes. Spleen: Calcified splenic granulomas are noted. Adrenals/Urinary Tract: Stable left adrenal adenoma. Right adrenal gland appears normal. No hydronephrosis or renal obstruction is noted. Urinary bladder is unremarkable. Stomach/Bowel: Stomach is unremarkable. There is no evidence of bowel obstruction. Diverticulosis of descending and sigmoid colon is noted without inflammation. Moderate amount of stool seen in the rectum. The appendix is not clearly visualized. Vascular/Lymphatic: Aortic atherosclerosis. No enlarged abdominal or pelvic lymph nodes. Reproductive: Status post hysterectomy. No adnexal masses. Other: No abdominal wall hernia or abnormality. No abdominopelvic ascites. Musculoskeletal: No acute or significant osseous findings. IMPRESSION: Small bilateral pleural effusions with adjacent subsegmental atelectasis. Diverticulosis of descending and sigmoid colon without inflammation. Stable  left adrenal adenoma. Aortic Atherosclerosis (ICD10-I70.0). Electronically Signed   By: Lupita Raider M.D.   On: 02/10/2023 11:42   VAS [place] LOWER EXTREMITY VENOUS (DVT) Result Date: 02/09/2023  Lower Venous DVT Study Patient Name:  Felicia Benson  Date of Exam:   02/08/2023 Medical Rec #: [id number]         Accession #:    [id number] Date of Birth: 23-Jun-1930          Patient Gender: F Patient Age:   44 years Exam Location:  [facility] Procedure:      VAS [place] LOWER EXTREMITY VENOUS (DVT) Referring Phys: [facility] GOEL --------------------------------------------------------------------------------  Indications: Swelling, and Pain. Other Indications: Right femur fracture after a mechanical fall. Performing Technologist: Marilynne Halsted RDMS, RVT  Examination Guidelines: A complete evaluation includes B-mode imaging, spectral Doppler, color Doppler, and power Doppler as needed of all accessible portions of each vessel. Bilateral testing is considered an integral part of a complete examination. Limited examinations for reoccurring indications  may be performed as noted. The reflux portion of the exam is performed with the patient in reverse Trendelenburg.  +---------+---------------+---------+-----------+----------+--------------+ RIGHT    CompressibilityPhasicitySpontaneityPropertiesThrombus Aging +---------+---------------+---------+-----------+----------+--------------+ CFV      Full           Yes      Yes                                 +---------+---------------+---------+-----------+----------+--------------+ SFJ      Full                                                        +---------+---------------+---------+-----------+----------+--------------+ FV Prox  Full                                                        +---------+---------------+---------+-----------+----------+--------------+ FV Mid   Full                                                         +---------+---------------+---------+-----------+----------+--------------+ FV DistalFull                                                        +---------+---------------+---------+-----------+----------+--------------+ PFV      Full                                                        +---------+---------------+---------+-----------+----------+--------------+ POP      Full           Yes      Yes                                 +---------+---------------+---------+-----------+----------+--------------+ PTV      Full                                                        +---------+---------------+---------+-----------+----------+--------------+ PERO     Full                                                        +---------+---------------+---------+-----------+----------+--------------+   +---------+---------------+---------+-----------+----------+--------------+ LEFT     CompressibilityPhasicitySpontaneityPropertiesThrombus Aging +---------+---------------+---------+-----------+----------+--------------+ CFV      Full  Yes      Yes                                 +---------+---------------+---------+-----------+----------+--------------+ SFJ      Full                                                        +---------+---------------+---------+-----------+----------+--------------+ FV Prox  Full                                                        +---------+---------------+---------+-----------+----------+--------------+ FV Mid   Full                                                        +---------+---------------+---------+-----------+----------+--------------+ FV DistalFull                                                        +---------+---------------+---------+-----------+----------+--------------+ PFV      Full                                                         +---------+---------------+---------+-----------+----------+--------------+ POP      Full           Yes      Yes                                 +---------+---------------+---------+-----------+----------+--------------+ PTV      Full                                                        +---------+---------------+---------+-----------+----------+--------------+ PERO     Partial                                      Acute          +---------+---------------+---------+-----------+----------+--------------+ Acute DVT in one of the paired peroneal veins.    Summary: RIGHT: - There is no evidence of deep vein thrombosis in the lower extremity.  - No cystic structure found in the popliteal fossa.  LEFT: - Findings consistent with acute deep vein thrombosis involving the left peroneal veins.  - No cystic structure found in the popliteal fossa.  *See table(s) above for measurements and observations. Electronically signed by Lemar Livings MD on 02/09/2023 at 4:00:59  PM.    Final    ECHOCARDIOGRAM COMPLETE Result Date: 02/08/2023    ECHOCARDIOGRAM REPORT   Patient Name:   Felicia Benson Date of Exam: 02/08/2023 Medical Rec #:  [id number]        Height:       66.0 in Accession #:    [id number]       Weight:       188.1 lb Date of Birth:  06-05-1930         BSA:          1.948 m Patient Age:    89 years         BP:           130/67 mmHg Patient Gender: F                HR:           94 bpm. Exam Location:  Inpatient Procedure: 2D Echo, Color Doppler and Cardiac Doppler Indications:    pulmonary emboli  History:        Patient has prior history of Echocardiogram examinations, most                 recent 07/23/2022. CHF, CAD; Risk Factors:Hypertension and                 Dyslipidemia.  Sonographer:    Melissa Morford RDCS (AE, PE) Referring Phys: [id number] RIPUDEEP K RAI IMPRESSIONS  1. Left ventricular ejection fraction, by estimation, is 50 to 55%. The left ventricle has low normal function. The left ventricle  demonstrates regional wall motion abnormalities with septal hypokinesis. There is mild concentric left ventricular hypertrophy. Indeterminate diastolic filling due to E-A fusion.  2. D-shaped septum suggestive of RV pressure/volume overload. Right ventricular systolic function is moderately reduced. The right ventricular size is moderately enlarged. There is moderately elevated pulmonary artery systolic pressure. The estimated right ventricular systolic pressure is 56.2 mmHg.  3. Left atrial size was mildly dilated.  4. Right atrial size was moderately dilated.  5. The mitral valve is normal in structure. No evidence of mitral valve regurgitation. No evidence of mitral stenosis.  6. The aortic valve is tricuspid. There is mild calcification of the aortic valve. Aortic valve regurgitation is not visualized. No aortic stenosis is present.  7. Aortic dilatation noted. There is mild dilatation of the aortic root, measuring 38 mm. There is mild dilatation of the ascending aorta, measuring 43 mm.  8. The inferior vena cava is normal in size with <50% respiratory variability, suggesting right atrial pressure of 8 mmHg. FINDINGS  Left Ventricle: Left ventricular ejection fraction, by estimation, is 50 to 55%. The left ventricle has low normal function. The left ventricle demonstrates regional wall motion abnormalities. The left ventricular internal cavity size was normal in size. There is mild concentric left ventricular hypertrophy. Indeterminate diastolic filling due to E-A fusion. Right Ventricle: D-shaped septum suggestive of RV pressure/volume overload. The right ventricular size is moderately enlarged. No increase in right ventricular wall thickness. Right ventricular systolic function is moderately reduced. There is moderately  elevated pulmonary artery systolic pressure. The tricuspid regurgitant velocity is 3.47 m/s, and with an assumed right atrial pressure of 8 mmHg, the estimated right ventricular systolic  pressure is 56.2 mmHg. Left Atrium: Left atrial size was mildly dilated. Right Atrium: Right atrial size was moderately dilated. Pericardium: There is no evidence of pericardial effusion. Mitral Valve: The mitral valve is normal in structure. There is mild calcification  of the mitral valve leaflet(s). Mild mitral annular calcification. No evidence of mitral valve regurgitation. No evidence of mitral valve stenosis. Tricuspid Valve: The tricuspid valve is normal in structure. Tricuspid valve regurgitation is mild. Aortic Valve: The aortic valve is tricuspid. There is mild calcification of the aortic valve. Aortic valve regurgitation is not visualized. No aortic stenosis is present. Pulmonic Valve: The pulmonic valve was normal in structure. Pulmonic valve regurgitation is mild to moderate. Aorta: Aortic dilatation noted. There is mild dilatation of the aortic root, measuring 38 mm. There is mild dilatation of the ascending aorta, measuring 43 mm. Venous: The inferior vena cava is normal in size with less than 50% respiratory variability, suggesting right atrial pressure of 8 mmHg. IAS/Shunts: No atrial level shunt detected by color flow Doppler. Additional Comments: A device lead is visualized in the right ventricle.  LEFT VENTRICLE PLAX 2D LVIDd:         4.00 cm LVIDs:         3.30 cm LV PW:         1.10 cm LV IVS:        1.20 cm  RIGHT VENTRICLE RV S prime:     7.72 cm/s TAPSE (M-mode): 2.1 cm LEFT ATRIUM             Index        RIGHT ATRIUM           Index LA diam:        3.70 cm 1.90 cm/m   RA Area:     24.70 cm LA Vol (A2C):   43.4 ml 22.28 ml/m  RA Volume:   80.60 ml  41.37 ml/m LA Vol (A4C):   62.3 ml 31.98 ml/m LA Biplane Vol: 54.6 ml 28.03 ml/m  AORTIC VALVE LVOT Vmax:   106.00 cm/s LVOT Vmean:  76.400 cm/s LVOT VTI:    0.160 m  AORTA Ao Root diam: 3.80 cm Ao Asc diam:  4.30 cm TRICUSPID VALVE TR Peak grad:   48.2 mmHg TR Vmax:        347.00 cm/s  SHUNTS Systemic VTI: 0.16 m Dalton McleanMD  Electronically signed by Wilfred Lacy Signature Date/Time: 02/08/2023/12:56:56 PM    Final    DG Swallowing Func-Speech Pathology Result Date: 02/08/2023 Table formatting from the original result was not included. Modified Barium Swallow Study Patient Details Name: SIAN ROCKERS MRN: [id number] Date of Birth: 14-Apr-1930 Today's Date: 02/08/2023 HPI/PMH: HPI: Felicia Benson is a 89 y.o. female with medical history significant of osteoarthritis, Crohn's disease, GERD, hypertension, abdominal aortic aneurysm . Displaced right intertrochanteric femur fracture after a mechanical fall -underwent ORIF by orthopedics on 10/30/2022.subsequent re-hospitalisation and discharged on January 03, 2023 after she was admitted for complaint of rectal bleed suspected to be diverticular in nature requiring several units of blood transfusion patient tells me 3.  This was managed medically CT angiography did not show active bleed.  Although diverticulosis was seen. Patient was at rehab facility.  Has been complaining of cough with some scant sputum production as well as sensation of shortness of breath, fatigue, poor appetite for the last 7 days or so.  Patient was subsequently noted to have sats of 90% by the facility on her chronic 4 L/min of supplementary oxygen.  ER workup is done with CT angiography.  Small segmental pulmonary artery filling defect in the right lower lobe.  Interstitial and groundglass opacities in the lungs bilaterally possible edema or infiltrate.  Patchy airspace disease in  the lower lobes bilaterally possible atelectasis or interval infiltrate.  Mild bilateral pleural effusions. Clinical Impression: Clinical Impression: Patient presents with a mild, largely age related oropharyngeal dysphagia characterized by mildly reduced base of tongue and laryngeal weakness and age appropriate initiation of the swallow at the level of the vallecula and pyriform sinuses with intermittent trace penetration of thin  liquids. Even when challenged with multiple consecutive large boluses, penetrates were minimal and remained above the vocal cords, clearing with subsequent swallows and cued throat clear. Do not feel that this degree of deficit would account for multifocal PNA. Educated patient on benefits of small sips, slow rate, and possible occassional throat clear to decrease aspiration risks. Patient verbalized understanding. No further SLP needs indicated. Factors that may increase risk of adverse event in presence of aspiration Rubye Oaks & Clearance Coots 2021): No data recorded Recommendations/Plan: Swallowing Evaluation Recommendations Swallowing Evaluation Recommendations Recommendations: PO diet PO Diet Recommendation: Regular; Thin liquids (Level 0) Liquid Administration via: Cup; Straw Medication Administration: Whole meds with liquid Supervision: Patient able to self-feed Swallowing strategies  : Slow rate; Small bites/sips; Clear throat intermittently Postural changes: Position pt fully upright for meals Oral care recommendations: Oral care BID (2x/day) Treatment Plan Treatment Plan Treatment recommendations: No treatment recommended at this time Follow-up recommendations: No SLP follow up Functional status assessment: Patient has not had a recent decline in their functional status. Recommendations Recommendations for follow up therapy are one component of a multi-disciplinary discharge planning process, led by the attending physician.  Recommendations may be updated based on patient status, additional functional criteria and insurance authorization. Assessment: Orofacial Exam: Orofacial Exam Oral Cavity: Oral Hygiene: WFL Oral Cavity - Dentition: Dentures, bottom; Dentures, top Orofacial Anatomy: WFL Oral Motor/Sensory Function: WFL Anatomy: Anatomy: Presence of cervical hardware Boluses Administered: Boluses Administered Boluses Administered: Thin liquids (Level 0); Puree; Solid  Oral Impairment Domain: Oral Impairment  Domain Lip Closure: No labial escape Tongue control during bolus hold: Cohesive bolus between tongue to palatal seal Bolus preparation/mastication: Timely and efficient chewing and mashing Bolus transport/lingual motion: Brisk tongue motion Oral residue: Trace residue lining oral structures Location of oral residue : Tongue; Floor of mouth Initiation of pharyngeal swallow : Pyriform sinuses  Pharyngeal Impairment Domain: Pharyngeal Impairment Domain Soft palate elevation: No bolus between soft palate (SP)/pharyngeal wall (PW) Laryngeal elevation: Partial superior movement of thyroid cartilage/partial approximation of arytenoids to epiglottic petiole Anterior hyoid excursion: Partial anterior movement Epiglottic movement: Complete inversion Laryngeal vestibule closure: Incomplete, narrow column air/contrast in laryngeal vestibule Pharyngeal stripping wave : Present - complete Pharyngeal contraction (A/P view only): N/A Pharyngoesophageal segment opening: Complete distension and complete duration, no obstruction of flow Tongue base retraction: Trace column of contrast or air between tongue base and PPW Pharyngeal residue: Collection of residue within or on pharyngeal structures Location of pharyngeal residue: Valleculae; Pharyngeal wall; Pyriform sinuses; Diffuse (>3 areas)  Esophageal Impairment Domain: Esophageal Impairment Domain Esophageal clearance upright position: Complete clearance, esophageal coating Pill: Pill Consistency administered: Thin liquids (Level 0) Thin liquids (Level 0): Impaired (see clinical impressions) Penetration/Aspiration Scale Score: Penetration/Aspiration Scale Score 1.  Material does not enter airway: Puree; Solid; Pill 3.  Material enters airway, remains ABOVE vocal cords and not ejected out: Thin liquids (Level 0) Compensatory Strategies: Compensatory Strategies Compensatory strategies: No   General Information: Caregiver present: No  Diet Prior to this Study: Regular; Thin liquids  (Level 0)   Temperature : Normal   Respiratory Status: WFL   Supplemental O2: Nasal cannula   History of  Recent Intubation: No  Behavior/Cognition: Alert; Cooperative; Pleasant mood Self-Feeding Abilities: Able to self-feed Baseline vocal quality/speech: Normal Volitional Cough: Able to elicit Volitional Swallow: Able to elicit No data recorded Goal Planning: No data recorded No data recorded No data recorded No data recorded Consulted and agree with results and recommendations: Patient Pain: Pain Assessment Pain Assessment: No/denies pain End of Session: Start Time:SLP Start Time (ACUTE ONLY): 1030 Stop Time: SLP Stop Time (ACUTE ONLY): 1055 Time Calculation:SLP Time Calculation (min) (ACUTE ONLY): 25 min Charges: SLP Evaluations $ SLP Speech Visit: 1 Visit SLP Evaluations $BSS Swallow: 1 Procedure $MBS Swallow: 1 Procedure SLP visit diagnosis: SLP Visit Diagnosis: Dysphagia, oropharyngeal phase (R13.12) Past Medical History: Past Medical History: Diagnosis Date  AAA (abdominal aortic aneurysm) (HCC)   CHF (congestive heart failure) (HCC)   Crohn's disease (HCC)   Diverticulitis   Diverticulosis   GERD (gastroesophageal reflux disease)   GI bleed   Hypertension   OSA (obstructive sleep apnea)   Pacemaker   Thyroid nodule  Past Surgical History: Past Surgical History: Procedure Laterality Date  CHOLECYSTECTOMY    COLONOSCOPY WITH PROPOFOL N/A 06/01/2015  Procedure: COLONOSCOPY WITH PROPOFOL;  Surgeon: Charlott Rakes, MD;  Location: [facility] ENDOSCOPY;  Service: Endoscopy;  Laterality: N/A;  CORONARY ANGIOPLASTY WITH STENT PLACEMENT    ESOPHAGOGASTRODUODENOSCOPY (EGD) WITH PROPOFOL N/A 06/01/2015  Procedure: ESOPHAGOGASTRODUODENOSCOPY (EGD) WITH PROPOFOL;  Surgeon: Charlott Rakes, MD;  Location: [facility] ENDOSCOPY;  Service: Endoscopy;  Laterality: N/A;  GIVENS CAPSULE STUDY N/A 06/03/2015  Procedure: GIVENS CAPSULE STUDY;  Surgeon: Charlott Rakes, MD;  Location[facility] ENDOSCOPY;  Service: Endoscopy;  Laterality: N/A;   INTRAMEDULLARY (IM) NAIL INTERTROCHANTERIC Right 10/30/2022  Procedure: INTRAMEDULLARY nailing of right femur;  Surgeon: Joen Laura, MD;  Location: [facility] OR;  Service: Orthopedics;  Laterality: Right;  PACEMAKER PLACEMENT  [address] MA, CCC-SLP McCoy Leah Meryl 02/08/2023, 11:18 AM  CT Angio Chest PE W and/or Wo Contrast Addendum Date: 02/07/2023 ADDENDUM REPORT: 02/07/2023 20:08 ADDENDUM: Critical Value/emergent results were called by telephone at the time of interpretation on 02/07/2023 at 8:07 pm to provider Dr. Joneen Caraway , who verbally acknowledged these results. Electronically Signed   By: Thornell Sartorius M.D.   On: 02/07/2023 20:08   Result Date: 02/07/2023 CLINICAL DATA:  Pulmonary embolism suspected, high probability. EXAM: CT ANGIOGRAPHY CHEST WITH CONTRAST TECHNIQUE: Multidetector CT imaging of the chest was performed using the standard protocol during bolus administration of intravenous contrast. Multiplanar CT image reconstructions and MIPs were obtained to evaluate the vascular anatomy. RADIATION DOSE REDUCTION: This exam was performed according to the departmental dose-optimization program which includes automated exposure control, adjustment of the mA and/or kV according to patient size and/or use of iterative reconstruction technique. CONTRAST:  75mL OMNIPAQUE IOHEXOL 350 MG/ML SOLN COMPARISON:  07/19/2022. FINDINGS: Cardiovascular: Heart is enlarged and there is no pericardial effusion. Pacemaker leads are noted in the heart. Multi-vessel coronary artery calcifications are present. There is atherosclerotic calcification of the aorta with aneurysmal dilatation of the ascending aorta measuring 4.3 cm. The pulmonary trunk is distended suggesting underlying pulmonary artery hypertension. There is a suspected pulmonary artery filling defect in a segmental pulmonary artery in the right lower lobe. The right ventricle is distended, similar in appearance to the prior exam. Mediastinum/Nodes:  Enlarged lymph nodes are present in the mediastinum measuring up to 1.4 cm in the AP window. Enlarged hilar lymph nodes are present bilaterally. No axillary lymphadenopathy. The thyroid gland is enlarged with heterogeneous attenuation. Surgical clips are noted in the  region of the left lobe of the thyroid gland. The trachea and esophagus are within normal limits. There is a small hiatal hernia. Lungs/Pleura: Ground-glass opacities and interstitial prominence are present in the lungs bilaterally. There is patchy atelectasis or infiltrate in the lower lobes bilaterally. Small bilateral pleural effusions are noted, greater on the right than on the left. No pneumothorax is seen. Upper Abdomen: The gallbladder is surgically absent. Scattered diverticula are noted along the colon without diverticulitis. No acute abnormality. Musculoskeletal: A pacemaker device is noted in the left chest wall. Degenerative changes are noted in the thoracic spine. No acute osseous abnormality is seen. Review of the MIP images confirms the above findings. IMPRESSION: 1. Small segmental pulmonary artery filling defect in the right lower lobe. The right ventricle is distended, similar in appearance to prior exams which may reflect right heart failure. The possibility of superimposed right heart strain can not be excluded. 2. Interstitial and ground-glass opacities in the lungs bilaterally, possible edema or infiltrate. 3. Patchy airspace disease in the lower lobes bilaterally, possible atelectasis or infiltrate. 4. Small bilateral pleural effusions. 5. Coronary artery calcifications. 6. Distended pulmonary trunk suggesting underlying pulmonary artery hypertension. 7. Aortic atherosclerosis with aneurysmal dilatation of the ascending aorta measuring 4.3 cm. Recommend annual imaging followup by CTA or MRA. This recommendation follows 2010 ACCF/AHA/AATS/ACR/ASA/SCA/SCAI/SIR/STS/SVM Guidelines for the Diagnosis and Management of Patients with  Thoracic Aortic Disease. Circulation. 2010; 121: Z610-R604. Aortic aneurysm NOS (ICD10-I71.9) Electronically Signed: By: Thornell Sartorius M.D. On: 02/07/2023 20:02   DG Chest Port 1 View Result Date: 02/07/2023 CLINICAL DATA:  Lethargy and weakness. EXAM: PORTABLE CHEST 1 VIEW COMPARISON:  Chest radiograph dated November 09, 2022. FINDINGS: Stable cardiomediastinal silhouette. Stable dual lead left-sided pacemaker in place. Aortic atherosclerosis. Streaky bibasilar opacities. Similar blunting of the left costophrenic angle. No pneumothorax. Partially visualized postsurgical changes of the cervical spine. Surgical staples along the left inferomedial neck. No acute osseous abnormality. IMPRESSION: 1. Streaky bibasilar opacities could reflect atelectasis, edema, or infiltrate. 2. Similar blunting of the left costophrenic angle may reflect a trace pleural effusion versus overlying soft tissue. Electronically Signed   By: Hart Robinsons M.D.   On: 02/07/2023 15:23    Microbiology: Results for orders placed or performed during the hospital encounter of 02/07/23  Resp panel by RT-PCR (RSV, Flu A&B, Covid) Anterior Nasal Swab     Status: None   Collection Time: 02/07/23  2:20 PM   Specimen: Anterior Nasal Swab  Result Value Ref Range Status   SARS Coronavirus 2 by RT PCR NEGATIVE NEGATIVE Final   Influenza A by PCR NEGATIVE NEGATIVE Final   Influenza B by PCR NEGATIVE NEGATIVE Final    Comment: (NOTE) The Xpert Xpress SARS-CoV-2/FLU/RSV plus assay is intended as an aid in the diagnosis of influenza from Nasopharyngeal swab specimens and should not be used as a sole basis for treatment. Nasal washings and aspirates are unacceptable for Xpert Xpress SARS-CoV-2/FLU/RSV testing.  Fact Sheet for Patients: BloggerCourse.com  Fact Sheet for Healthcare Providers: SeriousBroker.it  This test is not yet approved or cleared by the [place] FDA and has  been authorized for detection and/or diagnosis of SARS-CoV-2 by FDA under an Emergency Use Authorization (EUA). This EUA will remain in effect (meaning this test can be used) for the duration of the COVID-19 declaration under Section 564(b)(1) of the Act, 21 U.S.C. section 360bbb-3(b)(1), unless the authorization is terminated or revoked.     Resp Syncytial Virus by PCR NEGATIVE NEGATIVE Final  Comment: (NOTE) Fact Sheet for Patients: BloggerCourse.com  Fact Sheet for Healthcare Providers: SeriousBroker.it  This test is not yet approved or cleared by the [place] FDA and has been authorized for detection and/or diagnosis of SARS-CoV-2 by FDA under an Emergency Use Authorization (EUA). This EUA will remain in effect (meaning this test can be used) for the duration of the COVID-19 declaration under Section 564(b)(1) of the Act, 21 U.S.C. section 360bbb-3(b)(1), unless the authorization is terminated or revoked.  Performed at [facility], [address] [place], [place] [address]     Labs: CBC: Recent Labs  Lab 02/13/23 0415 02/14/23 0411  WBC 6.1 6.2  HGB 8.8* 8.8*  HCT 28.7* 28.0*  MCV 94.1 93.0  PLT 230 217   Basic Metabolic Panel: Recent Labs  Lab 02/15/23 0152 02/16/23 0500 02/17/23 0209 02/18/23 0400 02/19/23 0404  NA 131* 134* 133* 133* 133*  K 4.2 4.6 4.4 3.9 4.4  CL 99 99 99 94* 97*  CO2 25 27 26 28 30   GLUCOSE 138* 92 107* 110* 98  BUN 24* 26* 25* 25* 24*  CREATININE 1.24* 1.19* 1.11* 1.21* 1.24*  CALCIUM 7.7* 7.9* 7.8* 7.9* 7.7*  MG  --  1.7 1.7 1.6* 2.1  PHOS  --  3.8 3.7 3.3 3.5   Liver Function Tests: No results for input(s): "AST", "ALT", "ALKPHOS", "BILITOT", "PROT", "ALBUMIN" in the last 168 hours. CBG: No results for input(s): "GLUCAP" in the last 168 hours.  Discharge time spent: greater than 30 minutes.  Signed: Baron Hamper , MD [facility] 02/19/2023

## 2023-02-19 NOTE — Progress Notes (Signed)
Progress Note  Patient Name: Felicia Benson Date of Encounter: 02/19/2023  Primary Cardiologist: None   Subjective   Patient seen examined her bedside.  She had a few questions about time of discharge.  Defer to the primary team.  Inpatient Medications    Scheduled Meds:  atorvastatin  40 mg Oral QHS   benzonatate  200 mg Oral TID   dextromethorphan  15 mg Oral BID   feeding supplement  237 mL Oral BID BM   furosemide  40 mg Oral Daily   Gerhardt's butt cream   Topical BID   guaiFENesin  600 mg Oral BID   lidocaine  1 patch Transdermal Q24H   melatonin  3 mg Oral QHS   mesalamine  1,000 mg Oral BID   metoprolol succinate  25 mg Oral Daily   mometasone-formoterol  2 puff Inhalation BID   montelukast  10 mg Oral Daily   pantoprazole  40 mg Oral BID   sodium chloride flush  3 mL Intravenous Q12H   Continuous Infusions:   PRN Meds: acetaminophen **OR** acetaminophen, albuterol, HYDROcodone bit-homatropine, loperamide, meclizine, ondansetron, phenylephrine-shark liver oil-mineral oil-petrolatum   Vital Signs    Vitals:   02/18/23 1659 02/18/23 2002 02/18/23 2029 02/19/23 0549  BP:  99/71  115/73  Pulse: 99 83  78  Resp:  18  18  Temp:  98.3 F (36.8 C)  98.4 F (36.9 C)  TempSrc:      SpO2: 96% 96% 92% 96%  Weight:      Height:        Intake/Output Summary (Last 24 hours) at 02/19/2023 0801 Last data filed at 02/19/2023 0200 Gross per 24 hour  Intake 214.43 ml  Output 1200 ml  Net -985.57 ml   Filed Weights   02/07/23 2200  Weight: 85.3 kg    Telemetry     - Personally Reviewed  ECG     - Personally Reviewed  Physical Exam   General: Comfortable, Head: Atraumatic, normal size  Eyes: PEERLA, EOMI  Neck: Supple, normal JVD Cardiac: Normal S1, S2; RRR; no murmurs, rubs, or gallops Lungs: Clear to auscultation bilaterally Abd: Soft, nontender, no hepatomegaly  Ext: warm, +1 bilateral lower extremity edema Musculoskeletal: No deformities, BUE  and BLE strength normal and equal Skin: Warm and dry, no rashes   Neuro: Alert and oriented to person, place, time, and situation, CNII-XII grossly intact, no focal deficits  Psych: Normal mood and affect   Labs    Chemistry Recent Labs  Lab 02/17/23 0209 02/18/23 0400 02/19/23 0404  NA 133* 133* 133*  K 4.4 3.9 4.4  CL 99 94* 97*  CO2 26 28 30   GLUCOSE 107* 110* 98  BUN 25* 25* 24*  CREATININE 1.11* 1.21* 1.24*  CALCIUM 7.8* 7.9* 7.7*  GFRNONAA 47* 43* 41*  ANIONGAP 8 11 6      Hematology Recent Labs  Lab 02/13/23 0415 02/14/23 0411  WBC 6.1 6.2  RBC 3.05* 3.01*  HGB 8.8* 8.8*  HCT 28.7* 28.0*  MCV 94.1 93.0  MCH 28.9 29.2  MCHC 30.7 31.4  RDW 18.6* 17.9*  PLT 230 217    Cardiac EnzymesNo results for input(s): "TROPONINI" in the last 168 hours. No results for input(s): "TROPIPOC" in the last 168 hours.   BNPNo results for input(s): "BNP", "PROBNP" in the last 168 hours.   DDimer No results for input(s): "DDIMER" in the last 168 hours.   Radiology    No results found.  Cardiac  Studies   Prior echo  Patient Profile     89 y.o. female with a history of Crohn's disease, recent GI bleed, ascending aortic aneurysm, COPD, coronary artery disease-status post PCI, status post pacemaker recently admitted with multifocal pneumonia and pulmonary embolism.   Assessment & Plan    Acute on chronic diastolic heart failure Acute pulmonary embolism History of coronary artery disease Multifocal pneumonia   Slightly trending upward.  Will continue to monitor.  Do think that we can keep her on her Lasix for now and monitor the creatinine. Recent IVC filter placement.  Noted small subsegmental pulmonary embolus in the right lobe.  Due to recent GI bleed not on anticoagulation still anemic though.  CAD-no anginal symptoms  Pneumonia has been treated by the primary team.  Will sign off at this time.  For questions or updates, please contact [facility] Please  consult [email or web address] for contact info under Cardiology/STEMI.      Signed, Rudell Marlowe, DO  02/19/2023, 8:01 AM

## 2023-02-19 NOTE — TOC Transition Note (Addendum)
Transition of Care [facility]) - Discharge Note   Patient Details  Name: Felicia Benson MRN: [id number] Date of Birth: 07/16/1930  Transition of [facility] CM/SW Contact:  Baldemar Lenis, LCSW Phone Number: 02/19/2023, 10:56 AM   Clinical Narrative:   CSW updated by MD that patient stable to discharge to SNF today. CSW confirmed with [facility] that bed is available for patient. CSW sent discharge information, confirmed [facility] is ready. CSW updated daughter, Alvis Lemmings, by phone and she is in agreement. Transport arranged with [facility] for next available. Per RN, patient will be awaiting in the discharge lounge, [facility] is aware.   Nurse to call report to [phone].   UPDATE: Patient staying in room for [facility] arrival. [facility] notified of update in room.    Final next level of care: Skilled Nursing Facility Barriers to Discharge: Barriers Resolved   Patient Goals and CMS Choice Patient states their goals for this hospitalization and ongoing recovery are:: Rehab CMS Medicare.gov Compare Post Acute Care list provided to:: Patient Choice offered to / list presented to : Patient [place] ownership interest in [facility].provided to:: Patient    Discharge Placement              Patient [facility] bed at: [facility] Patient to be transferred to facility by: [facility] Name of family member notified: [name] Patient and family notified of of transfer: 02/19/23  Discharge Plan and Services Additional resources added to the After Visit Summary for       Post Acute Care Choice: Skilled Nursing Facility                               Social Drivers of Health (SDOH) Interventions SDOH Screenings   Food Insecurity: No Food Insecurity (02/08/2023)  Housing: Low Risk  (02/08/2023)  Recent Concern: Housing - High Risk (12/28/2022)  Transportation Needs: No Transportation Needs (02/08/2023)  Utilities: Not At Risk (02/08/2023)  Social Connections: Unknown (02/09/2023)  Tobacco Use: Medium  Risk (02/10/2023)     Readmission Risk Interventions    07/24/2022    2:49 PM  Readmission Risk Prevention Plan  Transportation Screening Complete  PCP or Specialist Appt within 5-7 Days Complete  Home Care Screening Complete  Medication Review (RN CM) Complete

## 2023-02-19 NOTE — Progress Notes (Signed)
DISCHARGE NOTE HOME Felicia Benson to be discharged Skilled nursing facility/[facility] per MD order. Discussed prescriptions and follow up appointments with the patient. Prescriptions given to patient; medication list explained in detail. Patient verbalized understanding.  Skin clean, dry and intact without evidence of skin break down, no evidence of skin tears noted. IV catheter discontinued intact. Site without signs and symptoms of complications. Dressing and pressure applied. Pt denies pain at the site currently. No complaints noted.  Patient free of lines, drains, and wounds.   An After Visit Summary (AVS) was printed and given to the patient. Patient discharged to SNF via stretcher by [facility]. Daughter notified of d/c. Report called to facility to [facility]   Irwin Brakeman, RN

## 2023-02-20 ENCOUNTER — Telehealth (HOSPITAL_COMMUNITY): Payer: Self-pay | Admitting: Pharmacy Technician

## 2023-02-20 NOTE — Telephone Encounter (Signed)
Pharmacy Patient Advocate Encounter   Received notification from Fax that prior authorization for Lidocaine 5% patches is required/requested.   Insurance verification completed.   The patient is insured through [facility] .   Per test claim: PA required; PA submitted to above mentioned insurance via CoverMyMeds Key/confirmation #/EOC [id number] Status is pending

## 2023-02-20 NOTE — Telephone Encounter (Signed)
Pharmacy Patient Advocate Encounter  Received notification from [facility] that Prior Authorization for Lidocaine 5% patches  has been APPROVED from 02/20/2023 to 01/08/2024   PA #/Case ID/Reference #: [id number]

## 2023-03-07 ENCOUNTER — Inpatient Hospital Stay (HOSPITAL_COMMUNITY)
Admission: EM | Admit: 2023-03-07 | Discharge: 2023-03-18 | DRG: 291 | Disposition: A | Discharge status: Skilled Nursing Facility | Payer: Medicare Other | Source: Skilled Nursing Facility | Attending: Internal Medicine | Admitting: Internal Medicine

## 2023-03-07 ENCOUNTER — Emergency Department (HOSPITAL_COMMUNITY): Payer: Medicare Other

## 2023-03-07 ENCOUNTER — Other Ambulatory Visit: Payer: Self-pay

## 2023-03-07 ENCOUNTER — Encounter (HOSPITAL_COMMUNITY): Payer: Self-pay

## 2023-03-07 DIAGNOSIS — Z6832 Body mass index (BMI) 32.0-32.9, adult: Secondary | ICD-10-CM

## 2023-03-07 DIAGNOSIS — J9621 Acute and chronic respiratory failure with hypoxia: Secondary | ICD-10-CM | POA: Diagnosis present

## 2023-03-07 DIAGNOSIS — W19XXXA Unspecified fall, initial encounter: Secondary | ICD-10-CM | POA: Diagnosis present

## 2023-03-07 DIAGNOSIS — Z1152 Encounter for screening for COVID-19: Secondary | ICD-10-CM | POA: Diagnosis not present

## 2023-03-07 DIAGNOSIS — L89153 Pressure ulcer of sacral region, stage 3: Secondary | ICD-10-CM | POA: Diagnosis present

## 2023-03-07 DIAGNOSIS — I442 Atrioventricular block, complete: Secondary | ICD-10-CM | POA: Diagnosis present

## 2023-03-07 DIAGNOSIS — Z6831 Body mass index (BMI) 31.0-31.9, adult: Secondary | ICD-10-CM | POA: Diagnosis not present

## 2023-03-07 DIAGNOSIS — I509 Heart failure, unspecified: Secondary | ICD-10-CM | POA: Diagnosis not present

## 2023-03-07 DIAGNOSIS — I5033 Acute on chronic diastolic (congestive) heart failure: Secondary | ICD-10-CM | POA: Diagnosis present

## 2023-03-07 DIAGNOSIS — D638 Anemia in other chronic diseases classified elsewhere: Secondary | ICD-10-CM | POA: Diagnosis present

## 2023-03-07 DIAGNOSIS — I2583 Coronary atherosclerosis due to lipid rich plaque: Secondary | ICD-10-CM | POA: Diagnosis not present

## 2023-03-07 DIAGNOSIS — Z7982 Long term (current) use of aspirin: Secondary | ICD-10-CM

## 2023-03-07 DIAGNOSIS — J9611 Chronic respiratory failure with hypoxia: Secondary | ICD-10-CM | POA: Diagnosis present

## 2023-03-07 DIAGNOSIS — D631 Anemia in chronic kidney disease: Secondary | ICD-10-CM | POA: Diagnosis present

## 2023-03-07 DIAGNOSIS — N189 Chronic kidney disease, unspecified: Secondary | ICD-10-CM | POA: Diagnosis present

## 2023-03-07 DIAGNOSIS — N179 Acute kidney failure, unspecified: Secondary | ICD-10-CM

## 2023-03-07 DIAGNOSIS — I2510 Atherosclerotic heart disease of native coronary artery without angina pectoris: Secondary | ICD-10-CM | POA: Diagnosis present

## 2023-03-07 DIAGNOSIS — I77810 Thoracic aortic ectasia: Secondary | ICD-10-CM | POA: Diagnosis present

## 2023-03-07 DIAGNOSIS — Z955 Presence of coronary angioplasty implant and graft: Secondary | ICD-10-CM

## 2023-03-07 DIAGNOSIS — Z888 Allergy status to other drugs, medicaments and biological substances status: Secondary | ICD-10-CM

## 2023-03-07 DIAGNOSIS — I10 Essential (primary) hypertension: Secondary | ICD-10-CM | POA: Diagnosis present

## 2023-03-07 DIAGNOSIS — I130 Hypertensive heart and chronic kidney disease with heart failure and stage 1 through stage 4 chronic kidney disease, or unspecified chronic kidney disease: Secondary | ICD-10-CM | POA: Diagnosis present

## 2023-03-07 DIAGNOSIS — Z882 Allergy status to sulfonamides status: Secondary | ICD-10-CM

## 2023-03-07 DIAGNOSIS — Z87891 Personal history of nicotine dependence: Secondary | ICD-10-CM

## 2023-03-07 DIAGNOSIS — Z66 Do not resuscitate: Secondary | ICD-10-CM | POA: Diagnosis present

## 2023-03-07 DIAGNOSIS — Z886 Allergy status to analgesic agent status: Secondary | ICD-10-CM

## 2023-03-07 DIAGNOSIS — I50810 Right heart failure, unspecified: Secondary | ICD-10-CM | POA: Diagnosis not present

## 2023-03-07 DIAGNOSIS — J410 Simple chronic bronchitis: Secondary | ICD-10-CM | POA: Diagnosis not present

## 2023-03-07 DIAGNOSIS — M25561 Pain in right knee: Secondary | ICD-10-CM | POA: Diagnosis present

## 2023-03-07 DIAGNOSIS — Z7951 Long term (current) use of inhaled steroids: Secondary | ICD-10-CM | POA: Diagnosis not present

## 2023-03-07 DIAGNOSIS — Z515 Encounter for palliative care: Secondary | ICD-10-CM

## 2023-03-07 DIAGNOSIS — K5090 Crohn's disease, unspecified, without complications: Secondary | ICD-10-CM | POA: Diagnosis present

## 2023-03-07 DIAGNOSIS — E785 Hyperlipidemia, unspecified: Secondary | ICD-10-CM | POA: Diagnosis present

## 2023-03-07 DIAGNOSIS — K219 Gastro-esophageal reflux disease without esophagitis: Secondary | ICD-10-CM | POA: Diagnosis present

## 2023-03-07 DIAGNOSIS — I2722 Pulmonary hypertension due to left heart disease: Secondary | ICD-10-CM | POA: Diagnosis present

## 2023-03-07 DIAGNOSIS — I4719 Other supraventricular tachycardia: Secondary | ICD-10-CM | POA: Diagnosis present

## 2023-03-07 DIAGNOSIS — E8809 Other disorders of plasma-protein metabolism, not elsewhere classified: Secondary | ICD-10-CM | POA: Diagnosis present

## 2023-03-07 DIAGNOSIS — Z86711 Personal history of pulmonary embolism: Secondary | ICD-10-CM

## 2023-03-07 DIAGNOSIS — I2489 Other forms of acute ischemic heart disease: Secondary | ICD-10-CM | POA: Diagnosis present

## 2023-03-07 DIAGNOSIS — E669 Obesity, unspecified: Secondary | ICD-10-CM | POA: Diagnosis present

## 2023-03-07 DIAGNOSIS — I5043 Acute on chronic combined systolic (congestive) and diastolic (congestive) heart failure: Secondary | ICD-10-CM

## 2023-03-07 DIAGNOSIS — Z823 Family history of stroke: Secondary | ICD-10-CM

## 2023-03-07 DIAGNOSIS — Z86718 Personal history of other venous thrombosis and embolism: Secondary | ICD-10-CM

## 2023-03-07 DIAGNOSIS — N1831 Chronic kidney disease, stage 3a: Secondary | ICD-10-CM | POA: Diagnosis present

## 2023-03-07 DIAGNOSIS — I3139 Other pericardial effusion (noninflammatory): Secondary | ICD-10-CM | POA: Diagnosis present

## 2023-03-07 DIAGNOSIS — R54 Age-related physical debility: Secondary | ICD-10-CM | POA: Diagnosis present

## 2023-03-07 DIAGNOSIS — Z885 Allergy status to narcotic agent status: Secondary | ICD-10-CM

## 2023-03-07 DIAGNOSIS — G4733 Obstructive sleep apnea (adult) (pediatric): Secondary | ICD-10-CM | POA: Diagnosis present

## 2023-03-07 DIAGNOSIS — Z8701 Personal history of pneumonia (recurrent): Secondary | ICD-10-CM

## 2023-03-07 DIAGNOSIS — Z801 Family history of malignant neoplasm of trachea, bronchus and lung: Secondary | ICD-10-CM

## 2023-03-07 DIAGNOSIS — E7800 Pure hypercholesterolemia, unspecified: Secondary | ICD-10-CM | POA: Diagnosis not present

## 2023-03-07 DIAGNOSIS — Z950 Presence of cardiac pacemaker: Secondary | ICD-10-CM

## 2023-03-07 DIAGNOSIS — J449 Chronic obstructive pulmonary disease, unspecified: Secondary | ICD-10-CM | POA: Diagnosis present

## 2023-03-07 DIAGNOSIS — M25571 Pain in right ankle and joints of right foot: Secondary | ICD-10-CM | POA: Diagnosis present

## 2023-03-07 DIAGNOSIS — Z9861 Coronary angioplasty status: Secondary | ICD-10-CM | POA: Diagnosis not present

## 2023-03-07 DIAGNOSIS — Z79899 Other long term (current) drug therapy: Secondary | ICD-10-CM

## 2023-03-07 DIAGNOSIS — Z7189 Other specified counseling: Secondary | ICD-10-CM | POA: Diagnosis not present

## 2023-03-07 DIAGNOSIS — R7989 Other specified abnormal findings of blood chemistry: Secondary | ICD-10-CM | POA: Diagnosis present

## 2023-03-07 DIAGNOSIS — I959 Hypotension, unspecified: Secondary | ICD-10-CM | POA: Diagnosis present

## 2023-03-07 LAB — TROPONIN I (HIGH SENSITIVITY): Troponin I (High Sensitivity): 48 ng/L — ABNORMAL HIGH (ref <18)

## 2023-03-07 LAB — CBC WITH DIFFERENTIAL/PLATELET
Abs Immature Granulocytes: 0.03 10*3/uL (ref 0.00–0.07)
Basophils Absolute: 0 10*3/uL (ref 0.0–0.1)
Basophils Relative: 0 %
Eosinophils Absolute: 0.1 10*3/uL (ref 0.0–0.5)
Eosinophils Relative: 2 %
HCT: 30.4 % — ABNORMAL LOW (ref 36.0–46.0)
Hemoglobin: 9.2 g/dL — ABNORMAL LOW (ref 12.0–15.0)
Immature Granulocytes: 1 %
Lymphocytes Relative: 11 %
Lymphs Abs: 0.6 10*3/uL — ABNORMAL LOW (ref 0.7–4.0)
MCH: 29.3 pg (ref 26.0–34.0)
MCHC: 30.3 g/dL (ref 30.0–36.0)
MCV: 96.8 fL (ref 80.0–100.0)
Monocytes Absolute: 0.7 10*3/uL (ref 0.1–1.0)
Monocytes Relative: 11 %
Neutro Abs: 4.5 10*3/uL (ref 1.7–7.7)
Neutrophils Relative %: 75 %
Platelets: 248 10*3/uL (ref 150–400)
RBC: 3.14 MIL/uL — ABNORMAL LOW (ref 3.87–5.11)
RDW: 20 % — ABNORMAL HIGH (ref 11.5–15.5)
WBC: 6 10*3/uL (ref 4.0–10.5)
nRBC: 0 % (ref 0.0–0.2)

## 2023-03-07 LAB — COMPREHENSIVE METABOLIC PANEL
ALT: 14 U/L (ref 0–44)
AST: 19 U/L (ref 15–41)
Albumin: 2.1 g/dL — ABNORMAL LOW (ref 3.5–5.0)
Alkaline Phosphatase: 54 U/L (ref 38–126)
Anion gap: 7 (ref 5–15)
BUN: 27 mg/dL — ABNORMAL HIGH (ref 8–23)
CO2: 32 mmol/L (ref 22–32)
Calcium: 7.8 mg/dL — ABNORMAL LOW (ref 8.9–10.3)
Chloride: 97 mmol/L — ABNORMAL LOW (ref 98–111)
Creatinine, Ser: 1.29 mg/dL — ABNORMAL HIGH (ref 0.44–1.00)
GFR, Estimated: 39 mL/min — ABNORMAL LOW (ref >60)
Glucose, Bld: 106 mg/dL — ABNORMAL HIGH (ref 70–99)
Potassium: 4.3 mmol/L (ref 3.5–5.1)
Sodium: 136 mmol/L (ref 135–145)
Total Bilirubin: 0.5 mg/dL (ref 0.0–1.2)
Total Protein: 5.6 g/dL — ABNORMAL LOW (ref 6.5–8.1)

## 2023-03-07 LAB — RESP PANEL BY RT-PCR (RSV, FLU A&B, COVID)  RVPGX2
Influenza A by PCR: NEGATIVE
Influenza B by PCR: NEGATIVE
Resp Syncytial Virus by PCR: NEGATIVE
SARS Coronavirus 2 by RT PCR: NEGATIVE

## 2023-03-07 LAB — BRAIN NATRIURETIC PEPTIDE: B Natriuretic Peptide: 1390.1 pg/mL — ABNORMAL HIGH (ref 0.0–100.0)

## 2023-03-07 MED ORDER — METOPROLOL TARTRATE 12.5 MG HALF TABLET
12.5000 mg | ORAL_TABLET | Freq: Two times a day (BID) | ORAL | Status: DC
  Administered 2023-03-07: 12.5 mg via ORAL
  Filled 2023-03-07: qty 1

## 2023-03-07 MED ORDER — ONDANSETRON HCL 4 MG/2ML IJ SOLN
4.0000 mg | Freq: Four times a day (QID) | INTRAMUSCULAR | Status: DC | PRN
Start: 2023-03-07 — End: 2023-03-18

## 2023-03-07 MED ORDER — PANTOPRAZOLE SODIUM 40 MG PO TBEC
40.0000 mg | DELAYED_RELEASE_TABLET | Freq: Every day | ORAL | Status: DC
  Administered 2023-03-08 – 2023-03-17 (×10): 40 mg via ORAL
  Filled 2023-03-07 (×10): qty 1

## 2023-03-07 MED ORDER — MONTELUKAST SODIUM 10 MG PO TABS
10.0000 mg | ORAL_TABLET | Freq: Every day | ORAL | Status: DC
  Administered 2023-03-08 – 2023-03-17 (×10): 10 mg via ORAL
  Filled 2023-03-07 (×10): qty 1

## 2023-03-07 MED ORDER — MIDODRINE HCL 5 MG PO TABS
5.0000 mg | ORAL_TABLET | ORAL | Status: AC
  Administered 2023-03-08: 5 mg via ORAL
  Filled 2023-03-07: qty 1

## 2023-03-07 MED ORDER — LOSARTAN POTASSIUM 25 MG PO TABS
25.0000 mg | ORAL_TABLET | Freq: Every day | ORAL | Status: DC
  Administered 2023-03-07: 25 mg via ORAL
  Filled 2023-03-07: qty 1

## 2023-03-07 MED ORDER — FUROSEMIDE 10 MG/ML IJ SOLN
40.0000 mg | Freq: Two times a day (BID) | INTRAMUSCULAR | Status: DC
  Administered 2023-03-07: 40 mg via INTRAVENOUS
  Filled 2023-03-07: qty 4

## 2023-03-07 MED ORDER — VITAMIN D 25 MCG (1000 UNIT) PO TABS
5000.0000 [IU] | ORAL_TABLET | Freq: Every day | ORAL | Status: DC
  Administered 2023-03-08 – 2023-03-17 (×10): 5000 [IU] via ORAL
  Filled 2023-03-07 (×10): qty 5

## 2023-03-07 MED ORDER — ASPIRIN 81 MG PO TBEC
81.0000 mg | DELAYED_RELEASE_TABLET | Freq: Every day | ORAL | Status: DC
  Administered 2023-03-08 – 2023-03-17 (×10): 81 mg via ORAL
  Filled 2023-03-07 (×10): qty 1

## 2023-03-07 MED ORDER — DOCUSATE SODIUM 100 MG PO CAPS
100.0000 mg | ORAL_CAPSULE | Freq: Every day | ORAL | Status: DC
  Administered 2023-03-07 – 2023-03-08 (×2): 100 mg via ORAL
  Filled 2023-03-07 (×2): qty 1

## 2023-03-07 MED ORDER — FERROUS SULFATE 325 (65 FE) MG PO TABS
325.0000 mg | ORAL_TABLET | Freq: Every day | ORAL | Status: DC
  Administered 2023-03-08 – 2023-03-17 (×10): 325 mg via ORAL
  Filled 2023-03-07 (×10): qty 1

## 2023-03-07 MED ORDER — ACETAMINOPHEN 325 MG PO TABS
650.0000 mg | ORAL_TABLET | ORAL | Status: DC | PRN
  Administered 2023-03-07 – 2023-03-16 (×12): 650 mg via ORAL
  Filled 2023-03-07 (×13): qty 2

## 2023-03-07 MED ORDER — MESALAMINE ER 250 MG PO CPCR
1000.0000 mg | ORAL_CAPSULE | Freq: Two times a day (BID) | ORAL | Status: DC
  Administered 2023-03-08 – 2023-03-17 (×20): 1000 mg via ORAL
  Filled 2023-03-07 (×21): qty 4

## 2023-03-07 MED ORDER — MOMETASONE FURO-FORMOTEROL FUM 200-5 MCG/ACT IN AERO
2.0000 | INHALATION_SPRAY | Freq: Two times a day (BID) | RESPIRATORY_TRACT | Status: DC
  Administered 2023-03-07 – 2023-03-17 (×21): 2 via RESPIRATORY_TRACT
  Filled 2023-03-07: qty 8.8

## 2023-03-07 MED ORDER — ATORVASTATIN CALCIUM 40 MG PO TABS
40.0000 mg | ORAL_TABLET | Freq: Every day | ORAL | Status: DC
  Administered 2023-03-07 – 2023-03-17 (×11): 40 mg via ORAL
  Filled 2023-03-07 (×11): qty 1

## 2023-03-07 MED ORDER — LIDOCAINE 5 % EX PTCH
1.0000 | MEDICATED_PATCH | Freq: Every day | CUTANEOUS | Status: DC
  Administered 2023-03-10 – 2023-03-17 (×7): 1 via TRANSDERMAL
  Filled 2023-03-07 (×10): qty 1

## 2023-03-07 MED ORDER — METOPROLOL SUCCINATE ER 25 MG PO TB24: 25.0000 mg | ORAL_TABLET | Freq: Every day | ORAL | Status: DC

## 2023-03-07 MED ORDER — SODIUM CHLORIDE 0.9 % IV SOLN: 250.0000 mL | INTRAVENOUS | Status: AC | PRN

## 2023-03-07 MED ORDER — ASPIRIN 81 MG PO TBEC: 81.0000 mg | DELAYED_RELEASE_TABLET | Freq: Every day | ORAL | Status: DC

## 2023-03-07 MED ORDER — ALBUTEROL SULFATE (2.5 MG/3ML) 0.083% IN NEBU: 3.0000 mL | INHALATION_SOLUTION | Freq: Four times a day (QID) | RESPIRATORY_TRACT | Status: DC | PRN

## 2023-03-07 MED ORDER — SODIUM CHLORIDE 0.9% FLUSH: 3.0000 mL | INTRAVENOUS | Status: DC | PRN

## 2023-03-07 MED ORDER — FUROSEMIDE 10 MG/ML IJ SOLN
20.0000 mg | Freq: Once | INTRAMUSCULAR | Status: AC
  Administered 2023-03-07: 20 mg via INTRAVENOUS
  Filled 2023-03-07: qty 2

## 2023-03-07 MED ORDER — ENOXAPARIN SODIUM 40 MG/0.4ML IJ SOSY
40.0000 mg | PREFILLED_SYRINGE | INTRAMUSCULAR | Status: DC
  Administered 2023-03-07 – 2023-03-17 (×11): 40 mg via SUBCUTANEOUS
  Filled 2023-03-07 (×11): qty 0.4

## 2023-03-07 MED ORDER — POTASSIUM CHLORIDE CRYS ER 20 MEQ PO TBCR
20.0000 meq | EXTENDED_RELEASE_TABLET | Freq: Two times a day (BID) | ORAL | Status: DC
Start: 2023-03-07 — End: 2023-03-11
  Administered 2023-03-07 – 2023-03-10 (×8): 20 meq via ORAL
  Filled 2023-03-07 (×8): qty 1

## 2023-03-07 MED ORDER — SODIUM CHLORIDE 0.9% FLUSH
3.0000 mL | Freq: Two times a day (BID) | INTRAVENOUS | Status: DC
  Administered 2023-03-07 – 2023-03-17 (×20): 3 mL via INTRAVENOUS

## 2023-03-07 NOTE — ED Notes (Signed)
 Family updated as to patient's status.

## 2023-03-07 NOTE — ED Triage Notes (Signed)
 Pt BIB [facility] from [place] with c/o weakness x2 days. Facility noted O2 sats in low 80s on 4LNC which is her baseline. Pt has been recently diagnosed with PNA. Pt denies [facility] but feels chest tightness that has gone away since receiving nebulizer tx at facility. Aox4, VSS per EMS besides 108 HR. Pt O2 sats low 70s RA per EMS; O2 sats 100% now on 5L NRB.

## 2023-03-07 NOTE — H&P (Signed)
 History and Physical    Patient: Felicia Benson ZOX:[id number] DOB: 07/18/1930 DOA: 03/07/2023 DOS: the patient was seen and examined on 03/07/2023 PCP: [name], MD  Patient coming from: SNF  Chief Complaint:  Chief Complaint  Patient presents with   Weakness   HPI: Felicia Benson is a 89 y.o. female with medical history significant for CHF(chronic diastolic failure), PE/DVT, s/p IVC filter, HTN, hyperlipidemia, COPD. She presents to [facility] with complaints of leg swelling, a non-productive cough, and weakness.She has had increased shortness of breath. Thsi has been going on since 03/06/2023. She also complained of chest tightness that resolves with nebulizer.  The paitent was discharged from this facility on 02/19/2023 after an inpatient stay for CAP PE, DVT. While here an echocardiogram was performed that demonstrated an EF of 50-55%, indeterminate diastolic function, septal hypokinesis, and moderately reduced RV function. Upon discharge the patient was saturating in the 90's on 3L O2.Prior to arrival she was saturating 80% on her home O2.  In the ED she is requiring 5L, and was saturating 100%. Her chest tightness resolves with nebulizer.  In the ED her BNP was found to be 1390. On 02/07/2023 the patient's BNP was 892. On 07/21/2022 it was 194. Her troponin was 48. Is was 23 on 1/31. EKG demonstrated an atrial tachycardia with a new RBBB.   She will be admitted to a telemetry bed. She will be diuressed. Her troponin will be followed. Heart failure team has been consulted.  Review of Systems: As mentioned in the history of present illness. All other systems reviewed and are negative. Past Medical History:  Diagnosis Date   AAA (abdominal aortic aneurysm) (HCC)    CHF (congestive heart failure) (HCC)    Crohn's disease (HCC)    Diverticulitis    Diverticulosis    GERD (gastroesophageal reflux disease)    GI bleed    Hypertension    OSA (obstructive sleep apnea)    Pacemaker     Thyroid nodule    Past Surgical History:  Procedure Laterality Date   CHOLECYSTECTOMY     COLONOSCOPY WITH PROPOFOL N/A 06/01/2015   Procedure: COLONOSCOPY WITH PROPOFOL;  Surgeon: Charlott Rakes, MD;  Location: [facility] ENDOSCOPY;  Service: Endoscopy;  Laterality: N/A;   CORONARY ANGIOPLASTY WITH STENT PLACEMENT     ESOPHAGOGASTRODUODENOSCOPY (EGD) WITH PROPOFOL N/A 06/01/2015   Procedure: ESOPHAGOGASTRODUODENOSCOPY (EGD) WITH PROPOFOL;  Surgeon: Charlott Rakes, MD;  Location: [facility] ENDOSCOPY;  Service: Endoscopy;  Laterality: N/A;   GIVENS CAPSULE STUDY N/A 06/03/2015   Procedure: GIVENS CAPSULE STUDY;  Surgeon: Charlott Rakes, MD;  Location: [facility] ENDOSCOPY;  Service: Endoscopy;  Laterality: N/A;   INTRAMEDULLARY (IM) NAIL INTERTROCHANTERIC Right 10/30/2022   Procedure: INTRAMEDULLARY nailing of right femur;  Surgeon: Joen Laura, MD;  Location: [facility] OR;  Service: Orthopedics;  Laterality: Right;   IR IVC FILTER PLMT / S&I /IMG GUID/MOD SED  02/11/2023   PACEMAKER PLACEMENT  2015   Social History:  reports that she has quit smoking. She has never used smokeless tobacco. She reports that she does not drink alcohol and does not use drugs.  Allergies  Allergen Reactions   Motrin [Ibuprofen] Other (See Comments)    GI bleed   Ace Inhibitors Cough   Breztri Aerosphere [Budeson-Glycopyrrol-Formoterol] Hypertension   Cipro [Ciprofloxacin Hcl] Other (See Comments)    Avoid fluoroquinolones due to asc-aortic aneurysm   Levaquin [Levofloxacin] Other (See Comments)    Avoid fluoroquinolones due to asc-aortic aneurysm   Nsaids Nausea And  Vomiting and Other (See Comments)    Hx of GI bleed    Family History  Problem Relation Age of Onset   CVA Mother    Lung cancer Father    Colon cancer Neg Hx     Prior to Admission medications   Medication Sig Start Date End Date Taking? Authorizing Provider  albuterol (VENTOLIN HFA) 108 (90 Base) MCG/ACT inhaler Inhale 2 puffs into the lungs every 6  (six) hours as needed for wheezing or shortness of breath. 07/24/22   [name], MD  aspirin EC 81 MG tablet Take 1 tablet (81 mg total) by mouth daily. Swallow whole. 03/29/21   [name], MD  atorvastatin (LIPITOR) 40 MG tablet Take 40 mg by mouth at bedtime.    [provider]  Cholecalciferol (VITAMIN D-3 PO) Take 1 capsule by mouth daily.    [provider]  docusate sodium (COLACE) 100 MG capsule Take 1 capsule (100 mg total) by mouth daily. 01/04/23   [name], DO  ferrous sulfate 325 (65 FE) MG tablet Take 325 mg by mouth daily.    [provider]  furosemide (LASIX) 40 MG tablet Take 1 tablet (40 mg total) by mouth daily. 02/19/23 03/21/23  Baron Hamper, MD  lidocaine (LIDODERM) 5 % Place 1 patch onto the skin daily. 03/04/23   [provider]  metoprolol succinate (TOPROL-XL) 25 MG 24 hr tablet Take 1 tablet (25 mg total) by mouth daily. 02/19/23 03/21/23  Baron Hamper, MD  mometasone-formoterol (DULERA) 200-5 MCG/ACT AERO Inhale 2 puffs into the lungs 2 (two) times daily.    [provider]  montelukast (SINGULAIR) 10 MG tablet Take 1 tablet (10 mg total) by mouth daily. 08/01/21   [name], [name], MD  pantoprazole (PROTONIX) 40 MG tablet Take 40 mg by mouth in the morning and at bedtime.    [provider]  PENTASA 500 MG CR capsule Take 1,000 mg by mouth in the morning and at bedtime.    [provider]  potassium chloride SA (KLOR-CON M) 20 MEQ tablet Take 40 mEq by mouth daily.    [provider]    Physical Exam: Vitals:   03/07/23 1800 03/07/23 1946 03/07/23 2038 03/07/23 2053  BP: (!) 92/58 104/72    Pulse: 94 100 (!) 103 (!) 102  Resp: 19 19 (!) 27 18  Temp:  98.4 F (36.9 C)    TempSrc:  Oral    SpO2: 99% 94%    Weight:      Height:       Exam:  Constitutional:  The patient is awake, alert, and oriented x 3. No acute distress. Eyes:  pupils and irises appear normal Normal lids  and conjunctivae ENMT:  grossly normal hearing  Lips appear normal external ears, nose appear normal Oropharynx: mucosa, tongue,posterior pharynx appear normal Neck:  neck appears normal, no masses, normal ROM, supple no thyromegaly Positive JVD Respiratory:  No increased work of breathing. Positive for wheezing and rales. No rhonchi. No tactile fremitus Cardiovascular:  Regular rate and rhythm No murmurs, ectopy, or gallups. No lateral PMI. No thrills. Abdomen:  Abdomen is soft, non-tender, non-distended No hernias, masses, or organomegaly Normoactive bowel sounds.  Musculoskeletal:  No cyanosis or clubbing 2-3+ pitting edema of lower extremities Skin:  No rashes, lesions, ulcers palpation of skin: no induration or nodules Neurologic:  CN 2-12 intact Sensation all 4 extremities intact Psychiatric:  Mental status Mood, affect appropriate Orientation to person, place, time  judgment and insight appear intact  Data Reviewed:  CBC BMP CXR Assessment and Plan: No notes have been filed under this hospital service. Service: Hospitalist     Advance Care Planning:   Code Status: Limited: Do not attempt resuscitation (DNR) -DNR-LIMITED -Do Not Intubate/DNI    Consults: Cardiology  Family Communication: None available  Severity of Illness: The appropriate patient status for this patient is INPATIENT. Inpatient status is judged to be reasonable and necessary in order to provide the required intensity of service to ensure the patient's safety. The patient's presenting symptoms, physical exam findings, and initial radiographic and laboratory data in the context of their chronic comorbidities is felt to place them at high risk for further clinical deterioration. Furthermore, it is not anticipated that the patient will be medically stable for discharge from the hospital within 2 midnights of admission.   * I certify that at the point of admission it is my clinical judgment  that the patient will require inpatient hospital care spanning beyond 2 midnights from the point of admission due to high intensity of service, high risk for further deterioration and high frequency of surveillance required.*  Author: Brihany Butch, DO 03/07/2023 9:23 PM  For on call review [email or web address].

## 2023-03-07 NOTE — ED Provider Notes (Signed)
  EMERGENCY DEPARTMENT AT [facility] Provider Note   CSN: [id number] Arrival date & time: 03/07/23  1115     History  Chief Complaint  Patient presents with   Weakness    Felicia Benson is a 89 y.o. female history of CHF, AAA, Crohn's, hypertension, GERD, COPD presented for weakness for the past 2 days.  Patient was recently admitted and discharged on 02/19/2023 for pneumonia.  Patient received antibiotics and is finished all of her medications however states that yesterday she became short of breath and was given a breathing treatment which helped.  Patient was also seen for a DVT and PE and had IVC filter placed.  Patient states she has a nonproductive cough but denies fevers.  Patient does not have any chest pain and is normally on 4 L nasal cannula after receiving medications from EMS feels much better and does not have any shortness of breath.  Patient was reportedly hypoxic in the low 80s on her baseline oxygen and on room air was in the 70s.  Home Medications Prior to Admission medications   Medication Sig Start Date End Date Taking? Authorizing Provider  albuterol (VENTOLIN HFA) 108 (90 Base) MCG/ACT inhaler Inhale 2 puffs into the lungs every 6 (six) hours as needed for wheezing or shortness of breath. 07/24/22   [name], MD  aspirin EC 81 MG tablet Take 1 tablet (81 mg total) by mouth daily. Swallow whole. 03/29/21   [name], MD  atorvastatin (LIPITOR) 40 MG tablet Take 40 mg by mouth at bedtime.    [provider]  Cholecalciferol (VITAMIN D-3 PO) Take 1 capsule by mouth daily.    [provider]  docusate sodium (COLACE) 100 MG capsule Take 1 capsule (100 mg total) by mouth daily. 01/04/23   [name], DO  ferrous sulfate 325 (65 FE) MG tablet Take 325 mg by mouth daily.    [provider]  furosemide (LASIX) 40 MG tablet Take 1 tablet (40 mg total) by mouth daily. 02/19/23 03/21/23  Baron Hamper, MD  lidocaine  (LIDODERM) 5 % Place 1 patch onto the skin daily. 03/04/23   [provider]  metoprolol succinate (TOPROL-XL) 25 MG 24 hr tablet Take 1 tablet (25 mg total) by mouth daily. 02/19/23 03/21/23  Baron Hamper, MD  mometasone-formoterol (DULERA) 200-5 MCG/ACT AERO Inhale 2 puffs into the lungs 2 (two) times daily.    [provider]  montelukast (SINGULAIR) 10 MG tablet Take 1 tablet (10 mg total) by mouth daily. 08/01/21   [name], [name], MD  pantoprazole (PROTONIX) 40 MG tablet Take 40 mg by mouth in the morning and at bedtime.    [provider]  PENTASA 500 MG CR capsule Take 1,000 mg by mouth in the morning and at bedtime.    [provider]  potassium chloride SA (KLOR-CON M) 20 MEQ tablet Take 40 mEq by mouth daily.    [provider]      Allergies    Motrin [ibuprofen], Ace inhibitors, Breztri aerosphere [budeson-glycopyrrol-formoterol], Cipro [ciprofloxacin hcl], Levaquin [levofloxacin], and Nsaids    Review of Systems   Review of Systems  Neurological:  Positive for weakness.    Physical Exam Updated Vital Signs BP 110/69 (BP Location: Right Leg)   Pulse (!) 108   Temp 97.7 F (36.5 C) (Axillary)   Resp 16   Ht 5\' 6"  (1.676 m)   Wt 90.3 kg   SpO2 100%   BMI 32.12  kg/m  Physical Exam Constitutional:      General: She is not in acute distress. Cardiovascular:     Rate and Rhythm: Normal rate and regular rhythm.     Pulses: Normal pulses.     Heart sounds: Normal heart sounds.  Pulmonary:     Effort: Pulmonary effort is normal. No respiratory distress.     Breath sounds: Normal breath sounds.     Comments: Able to speak in full sentences Musculoskeletal:     Comments: 1+ bilateral pitting edema  Skin:    General: Skin is warm and dry.  Neurological:     Mental Status: She is alert.  Psychiatric:        Mood and Affect: Mood normal.     ED Results / Procedures / Treatments   Labs (all labs ordered are listed, but  only abnormal results are displayed) Labs Reviewed  CBC WITH DIFFERENTIAL/PLATELET - Abnormal; Notable for the following components:      Result Value   RBC 3.14 (*)    Hemoglobin 9.2 (*)    HCT 30.4 (*)    RDW 20.0 (*)    Lymphs Abs 0.6 (*)    All other components within normal limits  COMPREHENSIVE METABOLIC PANEL - Abnormal; Notable for the following components:   Chloride 97 (*)    Glucose, Bld 106 (*)    BUN 27 (*)    Creatinine, Ser 1.29 (*)    Calcium 7.8 (*)    Total Protein 5.6 (*)    Albumin 2.1 (*)    GFR, Estimated 39 (*)    All other components within normal limits  BRAIN NATRIURETIC PEPTIDE - Abnormal; Notable for the following components:   B Natriuretic Peptide 1,390.1 (*)    All other components within normal limits  TROPONIN I (HIGH SENSITIVITY) - Abnormal; Notable for the following components:   Troponin I (High Sensitivity) 48 (*)    All other components within normal limits  RESP PANEL BY RT-PCR (RSV, FLU A&B, COVID)  RVPGX2  URINALYSIS, ROUTINE W REFLEX MICROSCOPIC  CBC  CREATININE, SERUM  TROPONIN I (HIGH SENSITIVITY)  TROPONIN I (HIGH SENSITIVITY)    EKG EKG Interpretation Date/Time:  Thursday March 07 2023 11:26:47 EST Ventricular Rate:  106 PR Interval:  182 QRS Duration:  142 QT Interval:  354 QTC Calculation: 471 R Axis:   258  Text Interpretation: Sinus or ectopic atrial tachycardia Right bundle branch block Confirmed by Vivi Barrack [phone]) on 03/07/2023 11:47:05 AM  Radiology No results found.  Procedures .Critical Care  Performed by: Netta Corrigan, PA-C Authorized by: Netta Corrigan, PA-C   Critical care provider statement:    Critical care time (minutes):  40   Critical care time was exclusive of:  Separately billable procedures and treating other patients   Critical care was necessary to treat or prevent imminent or life-threatening deterioration of the following conditions:  Respiratory failure   Critical care was  time spent personally by me on the following activities:  Blood draw for specimens, development of treatment plan with patient or surrogate, discussions with consultants, evaluation of patient's response to treatment, examination of patient, obtaining history from patient or surrogate, review of old charts, re-evaluation of patient's condition, pulse oximetry, ordering and review of radiographic studies, ordering and review of laboratory studies and ordering and performing treatments and interventions   I assumed direction of critical care for this patient from another provider in my specialty: no     Care  discussed with: admitting provider       Medications Ordered in ED Medications  sodium chloride flush (NS) 0.9 % injection 3 mL (has no administration in time range)  sodium chloride flush (NS) 0.9 % injection 3 mL (has no administration in time range)  0.9 %  sodium chloride infusion (has no administration in time range)  acetaminophen (TYLENOL) tablet 650 mg (has no administration in time range)  ondansetron (ZOFRAN) injection 4 mg (has no administration in time range)  enoxaparin (LOVENOX) injection 30 mg (has no administration in time range)  furosemide (LASIX) injection 40 mg (has no administration in time range)  potassium chloride SA (KLOR-CON M) CR tablet 20 mEq (has no administration in time range)  losartan (COZAAR) tablet 25 mg (has no administration in time range)  atorvastatin (LIPITOR) tablet 40 mg (has no administration in time range)  aspirin EC tablet 81 mg (has no administration in time range)  docusate sodium (COLACE) capsule 100 mg (has no administration in time range)  pantoprazole (PROTONIX) EC tablet 40 mg (has no administration in time range)  ferrous sulfate tablet 325 mg (has no administration in time range)  mesalamine (PENTASA) CR capsule 1,000 mg (has no administration in time range)  Vitamin D-3 TABS (has no administration in time range)  albuterol (VENTOLIN  HFA) 108 (90 Base) MCG/ACT inhaler 2 puff (has no administration in time range)  mometasone-formoterol (DULERA) 200-5 MCG/ACT inhaler 2 puff (has no administration in time range)  montelukast (SINGULAIR) tablet 10 mg (has no administration in time range)  lidocaine (LIDODERM) 5 % 1 patch (has no administration in time range)  metoprolol tartrate (LOPRESSOR) tablet 12.5 mg (has no administration in time range)  furosemide (LASIX) injection 20 mg (20 mg Intravenous Given 03/07/23 1336)    ED Course/ Medical Decision Making/ A&P                                 Medical Decision Making Amount and/or Complexity of Data Reviewed Labs: ordered. Radiology: ordered.  Risk Prescription drug management. Decision regarding hospitalization.   Felicia Benson 89 y.o. presented today for weakness.  Working DDx that I considered at this time includes, but not limited to, CHF, anemia, electrolyte abnormalities, viral illness, pneumonia, ACS, CVA/TIA, spinal cord pathology, GBS, rhabdomyolysis, alcohol induced/drug-induced, sepsis, hypothyroidism, severe dehydration  R/o DDx: anemia, electrolyte abnormalities, viral illness, pneumonia, ACS, CVA/TIA, spinal cord pathology, GBS, rhabdomyolysis, alcohol induced/drug-induced, sepsis, hypothyroidism, severe dehydration: These are considered less likely due to history of present illness, physical exam, labs/imaging findings  Review of prior external notes: 02/19/2023 discharge summary  Unique Tests and My Independent Interpretation:  EKG: 106 bpm, atrial tachycardia with right bundle branch block CBC: Unremarkable CMP: At baseline Chest x-ray: No acute pathology Troponin: 48 UA: Pending Respiratory panel: Unremarkable BNP: 1390.1  Social Determinants of Health: none  Discussion with Independent Historian:  EMS  Discussion of Management of Tests:  Swayze, DO Hospitalist  Risk: High: hospitalization or escalation of hospital-level care  Risk  Stratification Score: None  Staffed with Jearld Fenton, MD  Plan: On exam patient was in no acute distress but mildly tachycardic at 108 and. Physical exam showed 1+ bilateral pitting edema.  Patient is on 5 L nonrebreather which is slightly up from her baseline of 4 L. The cardiac monitor was ordered secondary to the patient's history of weakness and to monitor the patient for dysrhythmia. Cardiac monitor by my independent  interpretation showed atrial tachycardia.  Labs do show significant elevated BNP consistent with CHF exacerbation.  Will give Lasix here.  Do feel that patient will require admission as she does have multiple risk factors along with at one point requiring more oxygen per EMS given that she is on her baseline oxygen now.  Will consult hospitalist for admission.  Troponin was minimally elevated at 48 which in the setting of CHF exacerbation most likely demand ischemia.  This chart was dictated using voice recognition software.  Despite best efforts to proofread,  errors can occur which can change the documentation meaning.        Final Clinical Impression(s) / ED Diagnoses Final diagnoses:  Acute on chronic congestive heart failure, unspecified heart failure type (HCC)  Acute on chronic respiratory failure with hypoxia [facility])    Rx / DC Orders ED Discharge Orders     None         Remi Deter 03/07/23 1405    Loetta Rough, MD 03/15/23 2040

## 2023-03-08 ENCOUNTER — Inpatient Hospital Stay (HOSPITAL_COMMUNITY): Payer: Medicare Other

## 2023-03-08 DIAGNOSIS — I5033 Acute on chronic diastolic (congestive) heart failure: Secondary | ICD-10-CM

## 2023-03-08 DIAGNOSIS — I509 Heart failure, unspecified: Secondary | ICD-10-CM | POA: Diagnosis not present

## 2023-03-08 DIAGNOSIS — R7989 Other specified abnormal findings of blood chemistry: Secondary | ICD-10-CM | POA: Diagnosis present

## 2023-03-08 DIAGNOSIS — I50810 Right heart failure, unspecified: Secondary | ICD-10-CM

## 2023-03-08 DIAGNOSIS — D638 Anemia in other chronic diseases classified elsewhere: Secondary | ICD-10-CM

## 2023-03-08 DIAGNOSIS — E7800 Pure hypercholesterolemia, unspecified: Secondary | ICD-10-CM | POA: Diagnosis not present

## 2023-03-08 DIAGNOSIS — Z86711 Personal history of pulmonary embolism: Secondary | ICD-10-CM

## 2023-03-08 DIAGNOSIS — J410 Simple chronic bronchitis: Secondary | ICD-10-CM

## 2023-03-08 DIAGNOSIS — I2583 Coronary atherosclerosis due to lipid rich plaque: Secondary | ICD-10-CM

## 2023-03-08 DIAGNOSIS — I2510 Atherosclerotic heart disease of native coronary artery without angina pectoris: Secondary | ICD-10-CM | POA: Diagnosis not present

## 2023-03-08 DIAGNOSIS — J9621 Acute and chronic respiratory failure with hypoxia: Secondary | ICD-10-CM | POA: Diagnosis not present

## 2023-03-08 DIAGNOSIS — J9611 Chronic respiratory failure with hypoxia: Secondary | ICD-10-CM | POA: Diagnosis present

## 2023-03-08 DIAGNOSIS — I442 Atrioventricular block, complete: Secondary | ICD-10-CM

## 2023-03-08 LAB — BASIC METABOLIC PANEL
Anion gap: 9 (ref 5–15)
BUN: 27 mg/dL — ABNORMAL HIGH (ref 8–23)
CO2: 31 mmol/L (ref 22–32)
Calcium: 7.9 mg/dL — ABNORMAL LOW (ref 8.9–10.3)
Chloride: 99 mmol/L (ref 98–111)
Creatinine, Ser: 1.15 mg/dL — ABNORMAL HIGH (ref 0.44–1.00)
GFR, Estimated: 45 mL/min — ABNORMAL LOW (ref >60)
Glucose, Bld: 95 mg/dL (ref 70–99)
Potassium: 4.7 mmol/L (ref 3.5–5.1)
Sodium: 139 mmol/L (ref 135–145)

## 2023-03-08 LAB — CBC WITH DIFFERENTIAL/PLATELET
Abs Immature Granulocytes: 0.01 10*3/uL (ref 0.00–0.07)
Basophils Absolute: 0 10*3/uL (ref 0.0–0.1)
Basophils Relative: 0 %
Eosinophils Absolute: 0.2 10*3/uL (ref 0.0–0.5)
Eosinophils Relative: 3 %
HCT: 30.1 % — ABNORMAL LOW (ref 36.0–46.0)
Hemoglobin: 9.2 g/dL — ABNORMAL LOW (ref 12.0–15.0)
Immature Granulocytes: 0 %
Lymphocytes Relative: 12 %
Lymphs Abs: 0.7 10*3/uL (ref 0.7–4.0)
MCH: 29 pg (ref 26.0–34.0)
MCHC: 30.6 g/dL (ref 30.0–36.0)
MCV: 95 fL (ref 80.0–100.0)
Monocytes Absolute: 0.7 10*3/uL (ref 0.1–1.0)
Monocytes Relative: 13 %
Neutro Abs: 4 10*3/uL (ref 1.7–7.7)
Neutrophils Relative %: 72 %
Platelets: 243 10*3/uL (ref 150–400)
RBC: 3.17 MIL/uL — ABNORMAL LOW (ref 3.87–5.11)
RDW: 20.1 % — ABNORMAL HIGH (ref 11.5–15.5)
WBC: 5.6 10*3/uL (ref 4.0–10.5)
nRBC: 0 % (ref 0.0–0.2)

## 2023-03-08 LAB — TROPONIN I (HIGH SENSITIVITY): Troponin I (High Sensitivity): 45 ng/L — ABNORMAL HIGH (ref <18)

## 2023-03-08 LAB — ECHOCARDIOGRAM COMPLETE
AR max vel: 2.21 cm2
AV Area VTI: 2.84 cm2
AV Area mean vel: 2.63 cm2
AV Mean grad: 3 mmHg
AV Peak grad: 6.1 mmHg
Ao pk vel: 1.23 m/s
Area-P 1/2: 6.77 cm2
Calc EF: 65.8 %
Height: 66 in
MV VTI: 3.06 cm2
S' Lateral: 2.5 cm
Single Plane A2C EF: 65.3 %
Single Plane A4C EF: 67.7 %
Weight: 3366.4 [oz_av]

## 2023-03-08 MED ORDER — MIDODRINE HCL 5 MG PO TABS
10.0000 mg | ORAL_TABLET | Freq: Three times a day (TID) | ORAL | Status: DC
  Administered 2023-03-08 – 2023-03-17 (×30): 10 mg via ORAL
  Filled 2023-03-08 (×30): qty 2

## 2023-03-08 MED ORDER — FUROSEMIDE 10 MG/ML IJ SOLN
80.0000 mg | Freq: Two times a day (BID) | INTRAMUSCULAR | Status: DC
  Administered 2023-03-08 – 2023-03-09 (×4): 80 mg via INTRAVENOUS
  Filled 2023-03-08 (×4): qty 8

## 2023-03-08 MED ORDER — POLYETHYLENE GLYCOL 3350 17 G PO PACK
17.0000 g | PACK | Freq: Every day | ORAL | Status: DC
  Administered 2023-03-08 – 2023-03-15 (×7): 17 g via ORAL
  Filled 2023-03-08 (×9): qty 1

## 2023-03-08 MED ORDER — MIDODRINE HCL 5 MG PO TABS
5.0000 mg | ORAL_TABLET | ORAL | Status: AC
  Administered 2023-03-08: 5 mg via ORAL
  Filled 2023-03-08: qty 1

## 2023-03-08 NOTE — Consult Note (Addendum)
 Cardiology Consultation   Patient ID: PRESTON WEILL MRN: [id number]; DOB: 1930/02/16  Admit date: 03/07/2023 Date of Consult: 03/08/2023  PCP:  [name], MD   [facility] Providers Cardiologist:  None  Electrophysiologist:  Lanier Prude, MD  {  Patient Profile:   Felicia Benson is a 89 y.o. female with a hx of chronic HFpEF, complete heart block status post PPM July 2024,, pulmonary hypertension, CAD with stent to RCA, ISR 2017, recent segmental PE/DVT status post IVC filter, AAA, hypertension, CKD, Crohn's disease with recurrent diverticular bleed 12/2022, GERD, recent CAP, chronic hypoxic respiratory failure on supplemental oxygen who is being seen 03/08/2023 for the evaluation of CHF at the request of Dr. Isidoro Donning.  History of Present Illness:   Ms. Frisby patient is new to our group, previously followed by [employer].  Reportedly she has history of stenting to the RCA 2 with ISR and then repeat stenting 2017.  Unclear of exact interventions that were performed.  Reportedly had abnormal Myoview with defects in the apical/inferolateral walls but subsequent cardiac catheterization in January 2022 did not show significant CAD.  Microvascular disease seem to be more likely she was titrated on antianginals.  She had hospitalization in December 2024 with lower GI bleed secondary to Crohn's disease requiring blood transfusions.  Another admission January 2025 treated for multifocal pneumonia, subsegmental PE/DVT but not placed on DOAC given recent GI bleed.  She had IVC filter placed.  We had seen her February 2025 for CHF exacerbation after suffering a fall.  Echo with preserved EF, D-shaped septum.  Severely reduced RV function.  RVSP 59.6.  Appeared that she was under diuresed on home medication, reportedly was on 60 but only receiving 20 at her facility.  Again she is admitted for CHF exacerbation but more notably was noted to be hypoxic with oxygen saturations in  the 80s at her facility.  Not required BiPAP but high flow nasal cannula uptitrated to 6 L.  Also with nonproductive cough and significant progressive peripheral edema.  She was given 1 dose of IV Lasix 40 mg yesterday with decent reported urinary output, I's and O's have not been tracked accurately.  Additionally, has been hypotensive here now on midodrine 10 mg 3 times daily.  In the room she is satting in the 80s however suspect this is inaccurate given cold fingers, placed on her ear and she is satting appropriately in the 90s.  At this time patient offers no significant complaints she has no shortness of breath.  She has some chest pain but associated with chest tightness, relieved with her albuterol inhaler.  She had a nursing facility and reports being able to ambulate minimally but still somewhat self-sufficient.  She has a daughter that cares for her.  She follows with a pulmonologist for her COPD, used to be a long-term smoker of over 30 years but has since quit.  Chest x-ray here shows no significant changes from prior study indicating left retrocardiac opacity with small layering right pleural effusion.  She has flat troponins in the 40s BNP 1300+ which is elevated from her previous admission 829.  Respiratory panel negative.  Potassium 4.7.  Creatinine 1.15.  Calcium 7.9.  Hemoglobin 9.2.   Past Medical History:  Diagnosis Date   AAA (abdominal aortic aneurysm) (HCC)    CHF (congestive heart failure) (HCC)    Crohn's disease (HCC)    Diverticulitis    Diverticulosis    GERD (gastroesophageal reflux disease)  GI bleed    Hypertension    OSA (obstructive sleep apnea)    Pacemaker    Thyroid nodule     Past Surgical History:  Procedure Laterality Date   CHOLECYSTECTOMY     COLONOSCOPY WITH PROPOFOL N/A 06/01/2015   Procedure: COLONOSCOPY WITH PROPOFOL;  Surgeon: Charlott Rakes, MD;  Location: [facility] ENDOSCOPY;  Service: Endoscopy;  Laterality: N/A;   CORONARY ANGIOPLASTY WITH  STENT PLACEMENT     ESOPHAGOGASTRODUODENOSCOPY (EGD) WITH PROPOFOL N/A 06/01/2015   Procedure: ESOPHAGOGASTRODUODENOSCOPY (EGD) WITH PROPOFOL;  Surgeon: Charlott Rakes, MD;  Location: [facility] ENDOSCOPY;  Service: Endoscopy;  Laterality: N/A;   GIVENS CAPSULE STUDY N/A 06/03/2015   Procedure: GIVENS CAPSULE STUDY;  Surgeon: Charlott Rakes, MD;  Location: [facility] ENDOSCOPY;  Service: Endoscopy;  Laterality: N/A;   INTRAMEDULLARY (IM) NAIL INTERTROCHANTERIC Right 10/30/2022   Procedure: INTRAMEDULLARY nailing of right femur;  Surgeon: Joen Laura, MD;  Location: [facility] OR;  Service: Orthopedics;  Laterality: Right;   IR IVC FILTER PLMT / S&I /IMG GUID/MOD SED  02/11/2023   PACEMAKER PLACEMENT  2015     Inpatient Medications: Scheduled Meds:  aspirin EC  81 mg Oral Daily   atorvastatin  40 mg Oral QHS   cholecalciferol  5,000 Units Oral Daily   docusate sodium  100 mg Oral Daily   enoxaparin (LOVENOX) injection  40 mg Subcutaneous Q24H   ferrous sulfate  325 mg Oral Daily   lidocaine  1 patch Transdermal Daily   mesalamine  1,000 mg Oral BID   midodrine  10 mg Oral TID WC   mometasone-formoterol  2 puff Inhalation BID   montelukast  10 mg Oral Daily   pantoprazole  40 mg Oral Daily   potassium chloride  20 mEq Oral BID   sodium chloride flush  3 mL Intravenous Q12H   Continuous Infusions:  sodium chloride     PRN Meds: sodium chloride, acetaminophen, albuterol, ondansetron (ZOFRAN) IV, sodium chloride flush  Allergies:    Allergies  Allergen Reactions   Motrin [Ibuprofen] Other (See Comments)    GI bleed   Ace Inhibitors Cough   Breztri Aerosphere [Budeson-Glycopyrrol-Formoterol] Hypertension   Cipro [Ciprofloxacin Hcl] Other (See Comments)    Avoid fluoroquinolones due to asc-aortic aneurysm   Levaquin [Levofloxacin] Other (See Comments)    Avoid fluoroquinolones due to asc-aortic aneurysm   Nsaids Nausea And Vomiting and Other (See Comments)    Hx of GI bleed    Social  History:   Social History   Socioeconomic History   Marital status: Widowed    Spouse name: Not on file   Number of children: Not on file   Years of education: Not on file   Highest education level: Not on file  Occupational History   Not on file  Tobacco Use   Smoking status: Former   Smokeless tobacco: Never  Vaping Use   Vaping status: Never Used  Substance and Sexual Activity   Alcohol use: No    Comment: Quit in 1985   Drug use: No   Sexual activity: Not on file  Other Topics Concern   Not on file  Social History Narrative   Not on file   Social Drivers of Health   Financial Resource Strain: Not on file  Food Insecurity: No Food Insecurity (03/07/2023)   Hunger Vital Sign    Worried About Running Out of Food in the Last Year: Never true    Ran Out of Food in the Last Year: Never true  Transportation Needs: No Transportation Needs (03/07/2023)   PRAPARE - Administrator, Civil Service (Medical): No    Lack of Transportation (Non-Medical): No  Physical Activity: Not on file  Stress: Not on file  Social Connections: Moderately Isolated (03/07/2023)   Social Connection and Isolation Panel [NHANES]    Frequency of Communication with Friends and Family: More than three times a week    Frequency of Social Gatherings with Friends and Family: More than three times a week    Attends Religious Services: Never    Database administrator or Organizations: No    Attends Banker Meetings: 1 to 4 times per year    Marital Status: Widowed  Intimate Partner Violence: Not At Risk (03/07/2023)   Humiliation, Afraid, Rape, and Kick questionnaire    Fear of Current or Ex-Partner: No    Emotionally Abused: No    Physically Abused: No    Sexually Abused: No    Family History:   Family History  Problem Relation Age of Onset   CVA Mother    Lung cancer Father    Colon cancer Neg Hx      ROS:  Please see the history of present illness.  All other ROS  reviewed and negative.     Physical Exam/Data:   Vitals:   03/08/23 0102 03/08/23 0137 03/08/23 0325 03/08/23 0751  BP: 90/60 98/61 104/65 (!) 105/57  Pulse: 79  80 88  Resp: 20  15 18   Temp:   97.9 F (36.6 C)   TempSrc:   Oral   SpO2:   98% 93%  Weight:   95.4 kg   Height:        Intake/Output Summary (Last 24 hours) at 03/08/2023 1010 Last data filed at 03/08/2023 0420 Gross per 24 hour  Intake --  Output 600 ml  Net -600 ml      03/08/2023    3:25 AM 03/07/2023   11:27 AM 02/07/2023   10:00 PM  Last 3 Weights  Weight (lbs) 210 lb 6.4 oz 199 lb 188 lb 0.8 oz  Weight (kg) 95.437 kg 90.266 kg 85.3 kg     Body mass index is 33.96 kg/m.  General:  Well nourished, well developed, in no acute distress HEENT: normal Neck: Positive JVD Vascular: No carotid bruits; Distal pulses 2+ bilaterally Cardiac:  normal S1, S2; RRR; no murmur. Lungs: Positive crackles Abd: soft, nontender, no hepatomegaly  Ext: 3+ edema Musculoskeletal:  No deformities, BUE and BLE strength normal and equal Skin: warm and dry  Neuro:  CNs 2-12 intact, no focal abnormalities noted Psych:  Normal affect   EKG:  The EKG was personally reviewed and demonstrates: V paced heart rate 106.   Telemetry:  Telemetry was personally reviewed and demonstrates: V paced heart rates in the 100  Relevant CV Studies: Echocardiogram 03/08/2023 1. Left ventricular ejection fraction, by estimation, is 50 to 55%. The  left ventricle has low normal function. Left ventricular endocardial  border not optimally defined to evaluate regional wall motion. Left  ventricular diastolic parameters are  indeterminate.   2. D-shaped septum suggestive of RV pressure/volume overload. Right  ventricular systolic function is severely reduced. The right ventricular  size is moderately enlarged. There is moderately elevated pulmonary artery  systolic pressure. The estimated  right ventricular systolic pressure is 59.6 mmHg.   3.  Right atrial size was mildly dilated.   4. The mitral valve is normal in structure. No evidence of  mitral valve  regurgitation. No evidence of mitral stenosis.   5. The aortic valve is tricuspid. There is mild calcification of the  aortic valve. Aortic valve regurgitation is trivial. No aortic stenosis is  present.   6. Tricuspid valve regurgitation is moderate.   7. Aortic dilatation noted. There is mild dilatation of the ascending  aorta, measuring 43 mm.   8. The inferior vena cava is normal in size with greater than 50%  respiratory variability, suggesting right atrial pressure of 3 mmHg.   9. A small pericardial effusion is present. The pericardial effusion is  posterior and lateral to the left ventricle   Laboratory Data:  High Sensitivity Troponin:   Recent Labs  Lab 02/07/23 2155 02/08/23 0509 03/07/23 1213 03/08/23 0815  TROPONINIHS 23* 23* 48* 45*     Chemistry Recent Labs  Lab 03/07/23 1213 03/08/23 0439  NA 136 139  K 4.3 4.7  CL 97* 99  CO2 32 31  GLUCOSE 106* 95  BUN 27* 27*  CREATININE 1.29* 1.15*  CALCIUM 7.8* 7.9*  GFRNONAA 39* 45*  ANIONGAP 7 9    Recent Labs  Lab 03/07/23 1213  PROT 5.6*  ALBUMIN 2.1*  AST 19  ALT 14  ALKPHOS 54  BILITOT 0.5   Lipids No results for input(s): "CHOL", "TRIG", "HDL", "LABVLDL", "LDLCALC", "CHOLHDL" in the last 168 hours.  Hematology Recent Labs  Lab 03/07/23 1213 03/08/23 0439  WBC 6.0 5.6  RBC 3.14* 3.17*  HGB 9.2* 9.2*  HCT 30.4* 30.1*  MCV 96.8 95.0  MCH 29.3 29.0  MCHC 30.3 30.6  RDW 20.0* 20.1*  PLT 248 243   Thyroid No results for input(s): "TSH", "FREET4" in the last 168 hours.  BNP Recent Labs  Lab 03/07/23 1213  BNP 1,390.1*    DDimer No results for input(s): "DDIMER" in the last 168 hours.   Radiology/Studies:  ECHOCARDIOGRAM COMPLETE Result Date: 03/08/2023    ECHOCARDIOGRAM REPORT   Patient Name:   LISA MILIAN Date of Exam: 03/08/2023 Medical Rec #:  [id number]         Height:       66.0 in Accession #:    [id number]       Weight:       210.4 lb Date of Birth:  January 13, 1930         BSA:          2.043 m Patient Age:    89 years         BP:           121/67 mmHg Patient Gender: F                HR:           89 bpm. Exam Location:  Inpatient Procedure: 2D Echo, Cardiac Doppler and Color Doppler (Both Spectral and Color            Flow Doppler were utilized during procedure). Indications:    CHF  History:        Patient has prior history of Echocardiogram examinations, most                 recent 07/23/2022. CHF, CAD, COPD and TIA,                 Signs/Symptoms:Shortness of Breath; Risk Factors:Hypertension                 and Dyslipidemia. CKD stge 3.  Sonographer:    Dorann Lodge  Clelia Croft Referring Phys: [id number] AVA SWAYZE IMPRESSIONS  1. Left ventricular ejection fraction, by estimation, is 50 to 55%. The left ventricle has low normal function. Left ventricular endocardial border not optimally defined to evaluate regional wall motion. Left ventricular diastolic parameters are indeterminate.  2. D-shaped septum suggestive of RV pressure/volume overload. Right ventricular systolic function is severely reduced. The right ventricular size is moderately enlarged. There is moderately elevated pulmonary artery systolic pressure. The estimated right ventricular systolic pressure is 59.6 mmHg.  3. Right atrial size was mildly dilated.  4. The mitral valve is normal in structure. No evidence of mitral valve regurgitation. No evidence of mitral stenosis.  5. The aortic valve is tricuspid. There is mild calcification of the aortic valve. Aortic valve regurgitation is trivial. No aortic stenosis is present.  6. Tricuspid valve regurgitation is moderate.  7. Aortic dilatation noted. There is mild dilatation of the ascending aorta, measuring 43 mm.  8. The inferior vena cava is normal in size with greater than 50% respiratory variability, suggesting right atrial pressure of 3 mmHg.  9. A small pericardial  effusion is present. The pericardial effusion is posterior and lateral to the left ventricle. FINDINGS  Left Ventricle: Left ventricular ejection fraction, by estimation, is 50 to 55%. The left ventricle has low normal function. Left ventricular endocardial border not optimally defined to evaluate regional wall motion. The left ventricular internal cavity  size was normal in size. There is no left ventricular hypertrophy. Left ventricular diastolic parameters are indeterminate. Right Ventricle: D-shaped septum suggestive of RV pressure/volume overload. The right ventricular size is moderately enlarged. No increase in right ventricular wall thickness. Right ventricular systolic function is severely reduced. There is moderately elevated pulmonary artery systolic pressure. The tricuspid regurgitant velocity is 3.76 m/s, and with an assumed right atrial pressure of 3 mmHg, the estimated right ventricular systolic pressure is 59.6 mmHg. Left Atrium: Left atrial size was normal in size. Right Atrium: Right atrial size was mildly dilated. Pericardium: A small pericardial effusion is present. The pericardial effusion is posterior and lateral to the left ventricle. Mitral Valve: The mitral valve is normal in structure. Mild mitral annular calcification. No evidence of mitral valve regurgitation. No evidence of mitral valve stenosis. MV peak gradient, 6.1 mmHg. The mean mitral valve gradient is 2.0 mmHg. Tricuspid Valve: The tricuspid valve is normal in structure. Tricuspid valve regurgitation is moderate. Aortic Valve: The aortic valve is tricuspid. There is mild calcification of the aortic valve. Aortic valve regurgitation is trivial. No aortic stenosis is present. Aortic valve mean gradient measures 3.0 mmHg. Aortic valve peak gradient measures 6.1 mmHg. Aortic valve area, by VTI measures 2.84 cm. Pulmonic Valve: The pulmonic valve was normal in structure. Pulmonic valve regurgitation is mild. Aorta: Aortic dilatation  noted. There is mild dilatation of the ascending aorta, measuring 43 mm. Venous: The inferior vena cava is normal in size with greater than 50% respiratory variability, suggesting right atrial pressure of 3 mmHg. IAS/Shunts: No atrial level shunt detected by color flow Doppler. Additional Comments: A device lead is visualized in the right ventricle.  LEFT VENTRICLE PLAX 2D LVIDd:         4.00 cm     Diastology LVIDs:         2.50 cm     LV e' lateral:   14.50 cm/s LV PW:         0.90 cm     LV E/e' lateral: 8.8 LV IVS:  0.90 cm LVOT diam:     2.00 cm LV SV:         50 LV SV Index:   25 LVOT Area:     3.14 cm  LV Volumes (MOD) LV vol d, MOD A2C: 93.6 ml LV vol d, MOD A4C: 90.8 ml LV vol s, MOD A2C: 32.5 ml LV vol s, MOD A4C: 29.3 ml LV SV MOD A2C:     61.1 ml LV SV MOD A4C:     90.8 ml LV SV MOD BP:      63.8 ml RIGHT VENTRICLE            IVC RV Basal diam:  4.10 cm    IVC diam: 1.00 cm RV Mid diam:    4.00 cm RV S prime:     6.53 cm/s TAPSE (M-mode): 1.1 cm LEFT ATRIUM             Index        RIGHT ATRIUM           Index LA diam:        2.50 cm 1.22 cm/m   RA Area:     20.90 cm LA Vol (A2C):   42.3 ml 20.70 ml/m  RA Volume:   52.70 ml  25.79 ml/m LA Vol (A4C):   32.9 ml 16.10 ml/m LA Biplane Vol: 37.3 ml 18.25 ml/m  AORTIC VALVE                    PULMONIC VALVE AV Area (Vmax):    2.21 cm     PV Vmax:          0.94 m/s AV Area (Vmean):   2.63 cm     PV Peak grad:     3.6 mmHg AV Area (VTI):     2.84 cm     PR End Diast Vel: 9.73 msec AV Vmax:           123.00 cm/s AV Vmean:          75.200 cm/s AV VTI:            0.177 m AV Peak Grad:      6.1 mmHg AV Mean Grad:      3.0 mmHg LVOT Vmax:         86.60 cm/s LVOT Vmean:        62.900 cm/s LVOT VTI:          0.160 m LVOT/AV VTI ratio: 0.90  AORTA Ao Root diam: 3.60 cm Ao Asc diam:  4.10 cm MITRAL VALVE                TRICUSPID VALVE MV Area (PHT): 6.77 cm     TR Peak grad:   56.6 mmHg MV Area VTI:   3.06 cm     TR Vmax:        376.00 cm/s MV Peak  grad:  6.1 mmHg MV Mean grad:  2.0 mmHg     SHUNTS MV Vmax:       1.23 m/s     Systemic VTI:  0.16 m MV Vmean:      69.2 cm/s    Systemic Diam: 2.00 cm MV Decel Time: 112 msec MV E velocity: 127.00 cm/s Dalton McleanMD Electronically signed by Wilfred Lacy Signature Date/Time: 03/08/2023/9:26:51 AM    Final    DG Chest Port 1 View Result Date: 03/07/2023 CLINICAL DATA:  Shortness of breath.  Weakness. EXAM: PORTABLE CHEST 1  VIEW COMPARISON:  02/07/2023. FINDINGS: Re-demonstration of left retrocardiac airspace opacity obscuring the left hemidiaphragm, descending thoracic aorta and blunting the left lateral costophrenic angle, suggesting combination of left lung atelectasis and/or consolidation with pleural effusion. No significant interval change. There is probable small layering right pleural effusion as well, similar to the prior study. Bilateral lung fields are otherwise clear. Normal cardio-mediastinal silhouette. There is a left sided 2-lead pacemaker. No acute osseous abnormalities. Lower cervical spinal fixation hardware noted. Mild asymmetric fullness over the right lower neck corresponds to goiter on the prior CT scan chest. The soft tissues are otherwise within normal limits. IMPRESSION: *No significant interval change since the prior study. Persistent left retrocardiac opacity and small layering right pleural effusion, as described above. Electronically Signed   By: Jules Schick M.D.   On: 03/07/2023 14:21     Assessment and Plan:   Acute on chronic HFpEF Pulmonary hypertension, likely group 2 She has had rapid accumulation of volume since her recent discharge earlier this month.  Fortunately asymptomatic.  Echocardiogram shows preserved EF 50 to 55% with D-shaped septum with severely reduced RV function, moderately elevated RVSP 59.6 with moderate TR.  She has had insufficient urinary output on 40 Lasix and has not gotten anything today.   Needs higher dose of lasix increasing to 80 BID,  with low pressures may need to consider drip depending on diuretic response.  Low protein and albumin of 2.1 may make diuresis difficult. Now requiring midodrine 10 mg 3 times daily Suspect she needs higher p.o. maintenance dose.  Previously on 60 mg  Chronic hypoxic respiratory failure Has significant smoking history with COPD.  Noted to be hypoxic in the 80s however looks to be inaccurate with cold fingers.  Readjusted and placed a monitor on her ears and now in the 90s.  She is breathing fine and not tachypneic.  Complete heart block status post PPM Normal device functioning, V-paced  CAD with prior stent to RCA, ISR 2017 Not able to access for report of this, no anginal complaints here.  Has some chest tightness associated with COPD, improved with albuterol inhaler.  Troponin elevation here is flat and not consistent with ACS. Continue with aspirin 81 mg, atorvastatin 40 mg  Recent segmental PE/DVT Status post IVC filter given recent history of Crohn's disease with recurrent diverticular bleed.  Small pericardial effusion Located posterior and lateral to the LV. Likely does not need intervention and will improve with diuresis and time.  Mild dilatation of ascending aorta 43 mm Monitor.  Goals of care COPD Recent diverticular bleed 12/2022 Recent multifocal pneumonia She has recurrent admissions for various issues including multifocal pneumonia, DVT/PE, pulmonary hypertension.  Currently limited DNR.  Clinically appears to be stable for the time being but worry about overall prognosis, consider palliative care consult.    Risk Assessment/Risk Scores:    New York Heart Association (NYHA) Functional Class NYHA Class II        For questions or updates, please contact [facility] Please consult [email or web address] for contact info under    Signed, Abagail Kitchens, PA-C  03/08/2023 10:10 AM

## 2023-03-08 NOTE — Progress Notes (Signed)
 [facility]                                                                              [name], is a 89 y.o. female, DOB - 07/27/1930, BJY:[id number] Admit date - 03/07/2023    Outpatient Primary MD for the patient is [name], MD  LOS - 1  days  Chief Complaint  Patient presents with   Weakness       Brief summary   Patient is a 89 year old female with chronic diastolic CHF, PE/DVT status post IVC filter, HTN, HLP, COPD, CAD status post PCI of RCA, chronic respiratory failure with hypoxia on 4 L O2 via [place] at home with recent admission for CAP PNA, PE/LLE DVT status post IVC filter (02/07/2023-02/19/2023) presented with shortness of breath, noted to be hypoxic at SNF with O2 sats as low as 77%.  Patient was placed on 8 L O2 via HFNC in the ED.  Patient also noted to have residual nonproductive cough, significant lower extremity swelling, received Lasix 40 mg IV x 1 in ED.  She was also hypotensive on arrival and was placed on midodrine 10 mg 3 times daily.  No acute chest pain or shortness of breath. Chest x-ray showed small layering right pleural effusion, troponin 48-45, BNP 1390.  Assessment & Plan    Principal Problem:   Acute on chronic respiratory failure with hypoxia (HCC) -Multifactorial with acute on chronic diastolic CHF, COPD, pulmonary hypertension, recent multifocal pneumonia, recent segmental PE -On exam, patient was noted to be on 3 L O2 via [place] and hypoxic with O2 sats in 70s, increased O2 to 6 L during exam. -Markedly volume overloaded, cardiology consulted.  Continue O2 and wean slowly as tolerated   Active Problems:   Acute on chronic diastolic CHF (congestive heart failure) (HCC), volume overload Moderate to severe pulmonary hypertension, RV dysfunction Coronary artery disease - 2D echo showed EF 50 to 55% with D-shaped IVS consistent with pressure and volume overload, severe RV dysfunction, moderate to severe pulmonary  hypertension -Received Lasix 40 mg IV x 1 in ED -Cardiology consulted, increase Lasix to 80 mg IV twice daily -Borderline hypotensive, getting midodrine 10 mg 3 times daily, GDMT limited by hypotension -Coreg held    COPD (chronic obstructive pulmonary disease) (HCC) Recent multifocal pneumonia -Known COPD with smoking history -Currently no wheezing  History of recent pulmonary embolism, subsegmental PE, LLE DVT -During previous admission, status post IVC filter.  Was not able to tolerate anticoagulation, has history of recurrent diverticular bleeds  History of complete heart block -Status post PPM    Essential hypertension -BP currently, continue midodrine 10 mg 3 times daily  History of Crohn's disease, GERD -Continue Pentasa -Continue Protonix     Anemia of chronic disease -Hemoglobin 9.2, at baseline.  Hemoglobin was 8.8 on discharge on 02/14/2023  Obesity class II Estimated body mass index is 31.47 kg/m as calculated from the following:   Height as of this encounter: 5\' 6"  (1.676 m).   Weight as of this encounter: 88.5 kg.  Goals of care -Multiple comorbid conditions, recurrent admissions, requested palliative medicine consult for GOC.  Code Status: DNR DVT Prophylaxis:  enoxaparin (LOVENOX) injection 40 mg Start: 03/07/23 2200   Level of Care: Level of care: Telemetry Cardiac Family Communication: Updated patient Disposition Plan:      Remains inpatient appropriate:      Procedures:  2D echo  Consultants:   Cardiology Palliative medicine  Antimicrobials:   Anti-infectives (From admission, onward)    None          Medications  aspirin EC  81 mg Oral Daily   atorvastatin  40 mg Oral QHS   cholecalciferol  5,000 Units Oral Daily   docusate sodium  100 mg Oral Daily   enoxaparin (LOVENOX) injection  40 mg Subcutaneous Q24H   ferrous sulfate  325 mg Oral Daily   furosemide  80 mg Intravenous BID   lidocaine  1 patch Transdermal Daily    mesalamine  1,000 mg Oral BID   midodrine  10 mg Oral TID WC   mometasone-formoterol  2 puff Inhalation BID   montelukast  10 mg Oral Daily   pantoprazole  40 mg Oral Daily   potassium chloride  20 mEq Oral BID   sodium chloride flush  3 mL Intravenous Q12H      Subjective:   Felicia Benson was seen and examined today.  Noted to be short of breath, hypoxic, O2 sats in 70s, on 3 L O2 during exam.  Increased O2 to 6 L.  Denied any chest pain, fevers chills or productive cough.  Still significant volume overload with lower extremity edema.  Objective:   Vitals:   03/08/23 0137 03/08/23 0325 03/08/23 0751 03/08/23 1019  BP: 98/61 104/65 (!) 105/57   Pulse:  80 88   Resp:  15 18   Temp:  97.9 F (36.6 C)    TempSrc:  Oral    SpO2:  98% 93%   Weight:  95.4 kg  88.5 kg  Height:        Intake/Output Summary (Last 24 hours) at 03/08/2023 1134 Last data filed at 03/08/2023 0420 Gross per 24 hour  Intake --  Output 600 ml  Net -600 ml     Wt Readings from Last 3 Encounters:  03/08/23 88.5 kg  02/07/23 85.3 kg  12/28/22 85.3 kg     Exam General: Alert and oriented x 3, NAD Cardiovascular: S1 S2 auscultated,  RRR Respiratory: Bibasilar crackles Gastrointestinal: Soft, nontender, nondistended, + bowel sounds Ext: 3+ pedal edema bilaterally Neuro: no new deficits Psych: Normal affect     Data Reviewed:  I have personally reviewed following labs    CBC Lab Results  Component Value Date   WBC 5.6 03/08/2023   RBC 3.17 (L) 03/08/2023   HGB 9.2 (L) 03/08/2023   HCT 30.1 (L) 03/08/2023   MCV 95.0 03/08/2023   MCH 29.0 03/08/2023   PLT 243 03/08/2023   MCHC 30.6 03/08/2023   RDW 20.1 (H) 03/08/2023   LYMPHSABS 0.7 03/08/2023   MONOABS 0.7 03/08/2023   EOSABS 0.2 03/08/2023   BASOSABS 0.0 03/08/2023     Last metabolic panel Lab Results  Component Value Date   NA 139 03/08/2023   K 4.7 03/08/2023   CL 99 03/08/2023   CO2 31 03/08/2023   BUN 27 (H)  03/08/2023   CREATININE 1.15 (H) 03/08/2023   GLUCOSE 95 03/08/2023   GFRNONAA 45 (L) 03/08/2023   GFRAA 44 (L) 05/21/2019   CALCIUM 7.9 (L) 03/08/2023   PHOS 3.5 02/19/2023   PROT 5.6 (L) 03/07/2023  ALBUMIN 2.1 (L) 03/07/2023   BILITOT 0.5 03/07/2023   ALKPHOS 54 03/07/2023   AST 19 03/07/2023   ALT 14 03/07/2023   ANIONGAP 9 03/08/2023    CBG (last 3)  No results for input(s): "GLUCAP" in the last 72 hours.    Coagulation Profile: No results for input(s): "INR", "PROTIME" in the last 168 hours.   Radiology Studies: I have personally reviewed the imaging studies  ECHOCARDIOGRAM COMPLETE Result Date: 03/08/2023    ECHOCARDIOGRAM REPORT   Patient Name:   Felicia Benson Date of Exam: 03/08/2023 Medical Rec #:  [id number]        Height:       66.0 in Accession #:    [id number]       Weight:       210.4 lb Date of Birth:  09-15-1930         BSA:          2.043 m Patient Age:    89 years         BP:           121/67 mmHg Patient Gender: F                HR:           89 bpm. Exam Location:  Inpatient Procedure: 2D Echo, Cardiac Doppler and Color Doppler (Both Spectral and Color            Flow Doppler were utilized during procedure). Indications:    CHF  History:        Patient has prior history of Echocardiogram examinations, most                 recent 07/23/2022. CHF, CAD, COPD and TIA,                 Signs/Symptoms:Shortness of Breath; Risk Factors:Hypertension                 and Dyslipidemia. CKD stge 3.  Sonographer:    Vern Claude Referring Phys: [id number] AVA SWAYZE IMPRESSIONS  1. Left ventricular ejection fraction, by estimation, is 50 to 55%. The left ventricle has low normal function. Left ventricular endocardial border not optimally defined to evaluate regional wall motion. Left ventricular diastolic parameters are indeterminate.  2. D-shaped septum suggestive of RV pressure/volume overload. Right ventricular systolic function is severely reduced. The right ventricular size is  moderately enlarged. There is moderately elevated pulmonary artery systolic pressure. The estimated right ventricular systolic pressure is 59.6 mmHg.  3. Right atrial size was mildly dilated.  4. The mitral valve is normal in structure. No evidence of mitral valve regurgitation. No evidence of mitral stenosis.  5. The aortic valve is tricuspid. There is mild calcification of the aortic valve. Aortic valve regurgitation is trivial. No aortic stenosis is present.  6. Tricuspid valve regurgitation is moderate.  7. Aortic dilatation noted. There is mild dilatation of the ascending aorta, measuring 43 mm.  8. The inferior vena cava is normal in size with greater than 50% respiratory variability, suggesting right atrial pressure of 3 mmHg.  9. A small pericardial effusion is present. The pericardial effusion is posterior and lateral to the left ventricle. FINDINGS  Left Ventricle: Left ventricular ejection fraction, by estimation, is 50 to 55%. The left ventricle has low normal function. Left ventricular endocardial border not optimally defined to evaluate regional wall motion. The left ventricular internal cavity  size was normal in size. There is no left  ventricular hypertrophy. Left ventricular diastolic parameters are indeterminate. Right Ventricle: D-shaped septum suggestive of RV pressure/volume overload. The right ventricular size is moderately enlarged. No increase in right ventricular wall thickness. Right ventricular systolic function is severely reduced. There is moderately elevated pulmonary artery systolic pressure. The tricuspid regurgitant velocity is 3.76 m/s, and with an assumed right atrial pressure of 3 mmHg, the estimated right ventricular systolic pressure is 59.6 mmHg. Left Atrium: Left atrial size was normal in size. Right Atrium: Right atrial size was mildly dilated. Pericardium: A small pericardial effusion is present. The pericardial effusion is posterior and lateral to the left ventricle. Mitral  Valve: The mitral valve is normal in structure. Mild mitral annular calcification. No evidence of mitral valve regurgitation. No evidence of mitral valve stenosis. MV peak gradient, 6.1 mmHg. The mean mitral valve gradient is 2.0 mmHg. Tricuspid Valve: The tricuspid valve is normal in structure. Tricuspid valve regurgitation is moderate. Aortic Valve: The aortic valve is tricuspid. There is mild calcification of the aortic valve. Aortic valve regurgitation is trivial. No aortic stenosis is present. Aortic valve mean gradient measures 3.0 mmHg. Aortic valve peak gradient measures 6.1 mmHg. Aortic valve area, by VTI measures 2.84 cm. Pulmonic Valve: The pulmonic valve was normal in structure. Pulmonic valve regurgitation is mild. Aorta: Aortic dilatation noted. There is mild dilatation of the ascending aorta, measuring 43 mm. Venous: The inferior vena cava is normal in size with greater than 50% respiratory variability, suggesting right atrial pressure of 3 mmHg. IAS/Shunts: No atrial level shunt detected by color flow Doppler. Additional Comments: A device lead is visualized in the right ventricle.  LEFT VENTRICLE PLAX 2D LVIDd:         4.00 cm     Diastology LVIDs:         2.50 cm     LV e' lateral:   14.50 cm/s LV PW:         0.90 cm     LV E/e' lateral: 8.8 LV IVS:        0.90 cm LVOT diam:     2.00 cm LV SV:         50 LV SV Index:   25 LVOT Area:     3.14 cm  LV Volumes (MOD) LV vol d, MOD A2C: 93.6 ml LV vol d, MOD A4C: 90.8 ml LV vol s, MOD A2C: 32.5 ml LV vol s, MOD A4C: 29.3 ml LV SV MOD A2C:     61.1 ml LV SV MOD A4C:     90.8 ml LV SV MOD BP:      63.8 ml RIGHT VENTRICLE            IVC RV Basal diam:  4.10 cm    IVC diam: 1.00 cm RV Mid diam:    4.00 cm RV S prime:     6.53 cm/s TAPSE (M-mode): 1.1 cm LEFT ATRIUM             Index        RIGHT ATRIUM           Index LA diam:        2.50 cm 1.22 cm/m   RA Area:     20.90 cm LA Vol (A2C):   42.3 ml 20.70 ml/m  RA Volume:   52.70 ml  25.79 ml/m LA Vol  (A4C):   32.9 ml 16.10 ml/m LA Biplane Vol: 37.3 ml 18.25 ml/m  AORTIC VALVE  PULMONIC VALVE AV Area (Vmax):    2.21 cm     PV Vmax:          0.94 m/s AV Area (Vmean):   2.63 cm     PV Peak grad:     3.6 mmHg AV Area (VTI):     2.84 cm     PR End Diast Vel: 9.73 msec AV Vmax:           123.00 cm/s AV Vmean:          75.200 cm/s AV VTI:            0.177 m AV Peak Grad:      6.1 mmHg AV Mean Grad:      3.0 mmHg LVOT Vmax:         86.60 cm/s LVOT Vmean:        62.900 cm/s LVOT VTI:          0.160 m LVOT/AV VTI ratio: 0.90  AORTA Ao Root diam: 3.60 cm Ao Asc diam:  4.10 cm MITRAL VALVE                TRICUSPID VALVE MV Area (PHT): 6.77 cm     TR Peak grad:   56.6 mmHg MV Area VTI:   3.06 cm     TR Vmax:        376.00 cm/s MV Peak grad:  6.1 mmHg MV Mean grad:  2.0 mmHg     SHUNTS MV Vmax:       1.23 m/s     Systemic VTI:  0.16 m MV Vmean:      69.2 cm/s    Systemic Diam: 2.00 cm MV Decel Time: 112 msec MV E velocity: 127.00 cm/s Dalton McleanMD Electronically signed by Wilfred Lacy Signature Date/Time: 03/08/2023/9:26:51 AM    Final    DG Chest Port 1 View Result Date: 03/07/2023 CLINICAL DATA:  Shortness of breath.  Weakness. EXAM: PORTABLE CHEST 1 VIEW COMPARISON:  02/07/2023. FINDINGS: Re-demonstration of left retrocardiac airspace opacity obscuring the left hemidiaphragm, descending thoracic aorta and blunting the left lateral costophrenic angle, suggesting combination of left lung atelectasis and/or consolidation with pleural effusion. No significant interval change. There is probable small layering right pleural effusion as well, similar to the prior study. Bilateral lung fields are otherwise clear. Normal cardio-mediastinal silhouette. There is a left sided 2-lead pacemaker. No acute osseous abnormalities. Lower cervical spinal fixation hardware noted. Mild asymmetric fullness over the right lower neck corresponds to goiter on the prior CT scan chest. The soft tissues are otherwise  within normal limits. IMPRESSION: *No significant interval change since the prior study. Persistent left retrocardiac opacity and small layering right pleural effusion, as described above. Electronically Signed   By: Jules Schick M.D.   On: 03/07/2023 14:21       Laticha Ferrucci M.D. [facility] 03/08/2023, 11:34 AM  Available via Epic secure chat 7am-7pm After 7 pm, please refer to night coverage provider listed on amion.

## 2023-03-08 NOTE — Consult Note (Signed)
 WOC Nurse Consult Note: Reason for Consult: Consult requested for sacrum.  Performed remotely after review of progress notes and photo in the EMR. 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  Pressure Injury POA: Yes Dressing procedure/placement/frequency: Topical treatment orders provided for bedside nurses to perform as follows to absorb drainage and provide antimicrobial benefits: Apply a piece of Aquacel [place] # [id number]) to sacrum wound Q day, then cover with foam dressing.  Change foam dressing Q 3 days or PRN soiling. Please re-consult if further assistance is needed.  Thank-you,  Cammie Mcgee MSN, RN, CWOCN, [place], CNS [phone]

## 2023-03-08 NOTE — Progress Notes (Signed)
 Mobility Specialist Progress Note;   03/08/23 1000  Mobility  Activity Stood at bedside  Level of Assistance Minimal assist, patient does 75% or more  Assistive Device Other (Comment) (HHA)  Activity Response Tolerated well  Mobility Referral Yes  Mobility visit 1 Mobility  Mobility Specialist Start Time (ACUTE ONLY) 1000  Mobility Specialist Stop Time (ACUTE ONLY) 1007  Mobility Specialist Time Calculation (min) (ACUTE ONLY) 7 min   RN requesting assistance. Required light MinA +2 to stand from elevated bed in order to take pts weight. VSS throughout. Pt returned back to bed with all needs met. RN in room.   Caesar Bookman Mobility Specialist Please contact via SecureChat or [employer] [phone]

## 2023-03-08 NOTE — Evaluation (Addendum)
 Occupational Therapy Evaluation Patient Details Name: Felicia Benson MRN: [id number] DOB: 1930/08/30 Today's Date: 03/08/2023   History of Present Illness   Pt is a 89 y/o female presenting 03/07/23 with a non-productive cough, leg swelling, and weakness. Admitted with exacerbation of CHF. Of note, pt was previously admitted 02/19/23 for CAP, PE, and DVT. PMHx AAA, CHF, Crohn's Disease, Diverticulitis, diverticulosis, GERD, GI bleed, HTN, OSA, pacemaker, thyroid nodule.     Clinical Impressions Pt presents with decline in function and safety with ADLs and ADL mobility with impaired strength, balance and endurance. Pt fatigues easily, increased trime and rest breaks required to complete tasks. Pt previously d/c from hospital following PE and DVT. In October 2024, pt had R femur fx from fall and IM nail and transitioned to [facility] for rehab but states that she is now at [facility] and going back there to complete short term rehab. Pt reports that staff assissted her with bathing and toileting. Pt reported she has only been walking around 70 steps with a chair follow at the SNF. Pt reports that she lived at home with family prior to femur fx. Pt currently requires max - mod A with LB ADLs, max A with toileting and mod A with mobility/transfers using RW. Pt would benefit from acute OT services to address impairments to maximize level of function and safety     If plan is discharge home, recommend the following:   Assistance with cooking/housework;Assist for transportation;Help with stairs or ramp for entrance;A lot of help with bathing/dressing/bathroom;A lot of help with walking and/or transfers     Functional Status Assessment   Patient has had a recent decline in their functional status and demonstrates the ability to make significant improvements in function in a reasonable and predictable amount of time.     Equipment Recommendations   Other (comment) (TBD at next venue of  care)     Recommendations for Other Services         Precautions/Restrictions   Precautions Precautions: Fall Recall of Precautions/Restrictions: Intact Restrictions Weight Bearing Restrictions Per Provider Order: No     Mobility Bed Mobility Overal bed mobility: Needs Assistance Bed Mobility: Sit to Supine       Sit to supine: Mod assist   General bed mobility comments: pt seated EOB working with PT upon arrival. Assisted pt back to bed mod A with LEs, mod A to scoot to [facility] using bed controls    Transfers Overall transfer level: Needs assistance Equipment used: Rolling walker (2 wheels) Transfers: Sit to/from Stand Sit to Stand: Mod assist, From elevated surface           General transfer comment: Pt required mod A to power up from the bed and side step towards rail      Balance Overall balance assessment: Needs assistance Sitting-balance support: Feet supported, No upper extremity supported Sitting balance-Leahy Scale: Good Sitting balance - Comments: Pt sits EOB without LOB and slight anterior lean   Standing balance support: Bilateral upper extremity supported, During functional activity, Reliant on assistive device for balance Standing balance-Leahy Scale: Poor                             ADL either performed or assessed with clinical judgement   ADL Overall ADL's : Needs assistance/impaired     Grooming: Wash/dry hands;Wash/dry face;Sitting;Contact guard assist   Upper Body Bathing: Contact guard assist   Lower Body Bathing:  Maximal assistance;Moderate assistance   Upper Body Dressing : Contact guard assist   Lower Body Dressing: Maximal assistance;Moderate assistance   Toilet Transfer: Moderate assistance;Ambulation;Rolling walker (2 wheels);Stand-pivot Statistician Details (indicate cue type and reason): simulated Toileting- Clothing Manipulation and Hygiene: Maximal assistance;Sit to/from stand       Functional  mobility during ADLs: Moderate assistance;Rolling walker (2 wheels) General ADL Comments: pt fatigues easily, increased trime and restbreaks required to complete tasks     Vision Baseline Vision/History: 6 Macular Degeneration Ability to See in Adequate Light: 2 Moderately impaired (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)       Perception         Praxis         Pertinent Vitals/Pain Pain Assessment Pain Assessment: No/denies pain Faces Pain Scale: No hurt     Extremity/Trunk Assessment Upper Extremity Assessment Upper Extremity Assessment: Generalized weakness   Lower Extremity Assessment Lower Extremity Assessment: Defer to PT evaluation   Cervical / Trunk Assessment Cervical / Trunk Assessment: Kyphotic   Communication Communication Communication: No apparent difficulties   Cognition Arousal: Alert   Cognition: No apparent impairments                               Following commands: Intact       Cueing  General Comments   Cueing Techniques: Verbal cues      Exercises     Shoulder Instructions      Home Living Family/patient expects to be discharged to:: Skilled nursing facility Living Arrangements: Children   Type of Home: House Home Access: Stairs to enter Secretary/administrator of Steps: 1         Bathroom Shower/Tub: Producer, television/film/video: Handicapped height     Home Equipment: Agricultural consultant (2 wheels);Cane - single point;Shower seat;BSC/3in1   Additional Comments: 4L O2 baseline, wears CPAP at night      Prior Functioning/Environment Prior Level of Function : Needs assist       Physical Assist : Mobility (physical);ADLs (physical) Mobility (physical): Stairs;Gait ADLs (physical): IADLs Mobility Comments: Pt reported she has only been walking around 70 steps with a chair follow at the SNF ADLs Comments: Pt previously d/c from hospital following PE and DVT. In October  2024, pt had R femur fx from fall and IM nail and transitioned to [facility] for rehab but states that she is now at [facility] and going back there to complete short term rehab. Pt reports that staff assissted her with bathing and toileting     OT Problem List: Decreased strength;Decreased activity tolerance;Impaired balance (sitting and/or standing);Decreased knowledge of use of DME or AE;Cardiopulmonary status limiting activity   OT Treatment/Interventions: Self-care/ADL training;Energy conservation;DME and/or AE instruction;Therapeutic activities;Patient/family education;Balance training      OT Goals(Current goals can be found in the care plan section)   Acute Rehab OT Goals Patient Stated Goal: get stronger OT Goal Formulation: With patient Time For Goal Achievement: 03/23/23 Potential to Achieve Goals: Good ADL Goals Pt Will Perform Grooming: with supervision;with set-up;sitting Pt Will Perform Upper Body Bathing: with supervision;with set-up;sitting Pt Will Perform Lower Body Bathing: with mod assist;with min assist;sitting/lateral leans Pt Will Perform Upper Body Dressing: with supervision;with set-up;sitting Pt Will Transfer to Toilet: with min assist;with contact guard assist;ambulating;stand pivot transfer;bedside commode Pt Will Perform Toileting - Clothing Manipulation and hygiene: with mod  assist;with min assist;sitting/lateral leans;sit to/from stand   OT Frequency:  Min 1X/week    Co-evaluation              AM-PAC OT "6 Clicks" Daily Activity     Outcome Measure Help from another person eating meals?: None Help from another person taking care of personal grooming?: A Little Help from another person toileting, which includes using toliet, bedpan, or urinal?: A Lot Help from another person bathing (including washing, rinsing, drying)?: A Lot Help from another person to put on and taking off regular upper body clothing?: A Little Help from another person  to put on and taking off regular lower body clothing?: A Lot 6 Click Score: 16   End of Session Equipment Utilized During Treatment: Gait belt;Rolling walker (2 wheels)  Activity Tolerance: Patient limited by fatigue Patient left: with call bell/phone within reach;in bed;with bed alarm set  OT Visit Diagnosis: Unsteadiness on feet (R26.81);Other abnormalities of gait and mobility (R26.89);Repeated falls (R29.6);Muscle weakness (generalized) (M62.81)                Time: 4098-1191 OT Time Calculation (min): 39 min Charges:  OT General Charges $OT Visit: 1 Visit OT Evaluation $OT Eval Moderate Complexity: 1 Mod OT Treatments $Self Care/Home Management : 8-22 mins $Therapeutic Activity: 8-22 mins    Galen Manila 03/08/2023, 4:06 PM

## 2023-03-08 NOTE — Evaluation (Signed)
 Physical Therapy Evaluation Patient Details Name: Felicia Benson MRN: [id number] DOB: Jun 02, 1930 Today's Date: 03/08/2023  History of Present Illness  Pt is a 89 y/o female presenting 03/07/23 with a non-productive cough, leg swelling, and weakness. Admitted with exacerbation of CHF. Of note, pt was previously admitted 02/19/23 for CAP, PE, and DVT. PMHx AAA, CHF, Crohn's Disease, Diverticulitis, diverticulosis, GERD, GI bleed, HTN, OSA, pacemaker, thyroid nodule.  Clinical Impression  Pt is a 89 y.o. female presenting on 2/272/25 with above conditions and deficits below, see PT Problem List. PTA pt was residing in a SNF where she was receiving rehab for a prior PE and DVT. Currently she is min A for bed mobility with HOB greatly elevated, mod A with transfers, and CGA for ambulation with RW. Pt displays deficits in functional mobility, ambulation, balance, LE strength and AROM, and activity tolerance and would benefit from continued acute PT before d/c. Additionally, given pt's current condition, PLOF, and pt's desire, she would benefit from long-term skilled nursing care upon d/c. Will continue to follow acutely.          If plan is discharge home, recommend the following: A little help with walking and/or transfers;A little help with bathing/dressing/bathroom;Assistance with cooking/housework;Assist for transportation;Help with stairs or ramp for entrance   Can travel by private vehicle   Yes    Equipment Recommendations None recommended by PT  Recommendations for Other Services       Functional Status Assessment Patient has had a recent decline in their functional status and demonstrates the ability to make significant improvements in function in a reasonable and predictable amount of time.     Precautions / Restrictions Precautions Precautions: Fall Recall of Precautions/Restrictions: Intact Restrictions Weight Bearing Restrictions Per Provider Order: No      Mobility  Bed  Mobility Overal bed mobility: Needs Assistance Bed Mobility: Supine to Sit     Supine to sit: Min assist, HOB elevated, Used rails     General bed mobility comments: Pt required HOB to be elevated to > 60 degs in order to complete sitting EOB. Used bil rails    Transfers Overall transfer level: Needs assistance Equipment used: Rolling walker (2 wheels) Transfers: Sit to/from Stand Sit to Stand: Mod assist, From elevated surface           General transfer comment: Pt required mod A to power up from the bed and to complete hip extension upon standing. Needs additional time and trunk flexion to stand    Ambulation/Gait Ambulation/Gait assistance: Contact guard assist Gait Distance (Feet): 12 Feet Assistive device: Rolling walker (2 wheels) Gait Pattern/deviations: Step-through pattern, Decreased stride length, Trunk flexed, Shuffle Gait velocity: Decreased Gait velocity interpretation: 1.31 - 2.62 ft/sec, indicative of limited community ambulator   General Gait Details: Pt states she can walk to the door and back. Pt walks with short and shuffling steps with RW. Requires cueing for maintaining closer proximity to RW.  Stairs            Wheelchair Mobility     Tilt Bed    Modified Rankin (Stroke Patients Only)       Balance Overall balance assessment: Needs assistance Sitting-balance support: Feet supported, No upper extremity supported Sitting balance-Leahy Scale: Good Sitting balance - Comments: Pt sits EOB without LOB and slight anterior lean Postural control: Other (comment) (anterior lean) Standing balance support: Bilateral upper extremity supported, During functional activity, Reliant on assistive device for balance Standing balance-Leahy Scale: Poor Standing balance comment: Pt  reliant on RW to stand. No LOB. stands with increased trunk flexion                             Pertinent Vitals/Pain Pain Assessment Pain Assessment:  Faces Faces Pain Scale: No hurt    Home Living Family/patient expects to be discharged to:: Skilled nursing facility Living Arrangements: Children   Type of Home: House Home Access: Stairs to enter   Entrance Stairs-Number of Steps: 1     Home Equipment: Agricultural consultant (2 wheels);Cane - single point;Shower seat;BSC/3in1 Additional Comments: 4L O2 baseline, wears CPAP at night    Prior Function Prior Level of Function : Needs assist       Physical Assist : Mobility (physical);ADLs (physical) Mobility (physical): Stairs;Gait ADLs (physical): IADLs Mobility Comments: Pt reported she has only been walking around 70 steps with a chair follow at the SNF ADLs Comments: pt d/c from hospital following R femur fx and IM nail and transitioned to [facility] for rehab but states that she is now at [facility] and going back there to return to SNF and complete short term rehab. Pt reports that staff assissted her with bathing and toileting     Extremity/Trunk Assessment   Upper Extremity Assessment Upper Extremity Assessment: Generalized weakness    Lower Extremity Assessment Lower Extremity Assessment: Defer to PT evaluation    Cervical / Trunk Assessment Cervical / Trunk Assessment: Kyphotic  Communication   Communication Communication: No apparent difficulties    Cognition Arousal: Alert Behavior During Therapy: WFL for tasks assessed/performed   PT - Cognitive impairments: No apparent impairments                         Following commands: Intact       Cueing Cueing Techniques: Verbal cues     General Comments General comments (skin integrity, edema, etc.): VSS throughout on 4 L by [place]. Pt had sacral wound with yellow slough.    Exercises     Assessment/Plan    PT Assessment Patient needs continued PT services  PT Problem List Decreased strength;Decreased range of motion;Decreased activity tolerance;Decreased balance;Decreased mobility;Decreased  coordination;Decreased knowledge of use of DME;Decreased safety awareness;Decreased knowledge of precautions;Cardiopulmonary status limiting activity;Obesity       PT Treatment Interventions DME instruction;Gait training;Stair training;Functional mobility training;Therapeutic activities;Therapeutic exercise;Neuromuscular re-education;Balance training;Patient/family education    PT Goals (Current goals can be found in the Care Plan section)  Acute Rehab PT Goals Patient Stated Goal: Return to SNF long term PT Goal Formulation: With patient Time For Goal Achievement: 03/22/23 Potential to Achieve Goals: Good    Frequency Min 1X/week     Co-evaluation               AM-PAC PT "6 Clicks" Mobility  Outcome Measure Help needed turning from your back to your side while in a flat bed without using bedrails?: A Lot Help needed moving from lying on your back to sitting on the side of a flat bed without using bedrails?: A Lot Help needed moving to and from a bed to a chair (including a wheelchair)?: A Lot Help needed standing up from a chair using your arms (e.g., wheelchair or bedside chair)?: A Lot Help needed to walk in hospital room?: Total Help needed climbing 3-5 steps with a railing? : Total 6 Click Score: 10    End of Session Equipment Utilized During Treatment: Gait belt;Oxygen  Activity Tolerance: Patient limited by fatigue Patient left: in bed;with call bell/phone within reach;with bed alarm set Nurse Communication: Mobility status PT Visit Diagnosis: Unsteadiness on feet (R26.81);Other abnormalities of gait and mobility (R26.89);Muscle weakness (generalized) (M62.81);Difficulty in walking, not elsewhere classified (R26.2)    Time: 1411-1431 PT Time Calculation (min) (ACUTE ONLY): 20 min   Charges:   PT Evaluation $PT Eval Moderate Complexity: 1 Mod   PT General Charges $$ ACUTE PT VISIT: 1 Visit        [address], SPT   [place] 03/08/2023, 4:07 PM

## 2023-03-08 NOTE — Progress Notes (Signed)
  Echocardiogram 2D Echocardiogram has been performed.  Ocie Doyne RDCS 03/08/2023, 9:22 AM

## 2023-03-08 NOTE — Plan of Care (Signed)

## 2023-03-09 DIAGNOSIS — Z9861 Coronary angioplasty status: Secondary | ICD-10-CM

## 2023-03-09 DIAGNOSIS — I2510 Atherosclerotic heart disease of native coronary artery without angina pectoris: Secondary | ICD-10-CM

## 2023-03-09 DIAGNOSIS — I5033 Acute on chronic diastolic (congestive) heart failure: Secondary | ICD-10-CM | POA: Diagnosis not present

## 2023-03-09 DIAGNOSIS — J9621 Acute and chronic respiratory failure with hypoxia: Secondary | ICD-10-CM | POA: Diagnosis not present

## 2023-03-09 LAB — CBC WITH DIFFERENTIAL/PLATELET
Abs Immature Granulocytes: 0.02 10*3/uL (ref 0.00–0.07)
Basophils Absolute: 0 10*3/uL (ref 0.0–0.1)
Basophils Relative: 0 %
Eosinophils Absolute: 0.2 10*3/uL (ref 0.0–0.5)
Eosinophils Relative: 3 %
HCT: 31.8 % — ABNORMAL LOW (ref 36.0–46.0)
Hemoglobin: 9.9 g/dL — ABNORMAL LOW (ref 12.0–15.0)
Immature Granulocytes: 0 %
Lymphocytes Relative: 15 %
Lymphs Abs: 0.9 10*3/uL (ref 0.7–4.0)
MCH: 29.1 pg (ref 26.0–34.0)
MCHC: 31.1 g/dL (ref 30.0–36.0)
MCV: 93.5 fL (ref 80.0–100.0)
Monocytes Absolute: 0.7 10*3/uL (ref 0.1–1.0)
Monocytes Relative: 11 %
Neutro Abs: 4.5 10*3/uL (ref 1.7–7.7)
Neutrophils Relative %: 71 %
Platelets: 266 10*3/uL (ref 150–400)
RBC: 3.4 MIL/uL — ABNORMAL LOW (ref 3.87–5.11)
RDW: 20 % — ABNORMAL HIGH (ref 11.5–15.5)
WBC: 6.3 10*3/uL (ref 4.0–10.5)
nRBC: 0 % (ref 0.0–0.2)

## 2023-03-09 LAB — BASIC METABOLIC PANEL
Anion gap: 8 (ref 5–15)
BUN: 26 mg/dL — ABNORMAL HIGH (ref 8–23)
CO2: 31 mmol/L (ref 22–32)
Calcium: 7.9 mg/dL — ABNORMAL LOW (ref 8.9–10.3)
Chloride: 97 mmol/L — ABNORMAL LOW (ref 98–111)
Creatinine, Ser: 1.03 mg/dL — ABNORMAL HIGH (ref 0.44–1.00)
GFR, Estimated: 52 mL/min — ABNORMAL LOW (ref >60)
Glucose, Bld: 87 mg/dL (ref 70–99)
Potassium: 4.3 mmol/L (ref 3.5–5.1)
Sodium: 136 mmol/L (ref 135–145)

## 2023-03-09 MED ORDER — MELATONIN 3 MG PO TABS
3.0000 mg | ORAL_TABLET | Freq: Every day | ORAL | Status: DC
  Administered 2023-03-09 – 2023-03-17 (×9): 3 mg via ORAL
  Filled 2023-03-09 (×9): qty 1

## 2023-03-09 MED ORDER — SENNOSIDES-DOCUSATE SODIUM 8.6-50 MG PO TABS
1.0000 | ORAL_TABLET | Freq: Two times a day (BID) | ORAL | Status: DC
  Administered 2023-03-09 – 2023-03-15 (×11): 1 via ORAL
  Filled 2023-03-09 (×12): qty 1

## 2023-03-09 NOTE — Progress Notes (Signed)
 Mobility Specialist Progress Note;   03/09/23 1500  Mobility  Activity Refused mobility   Checked on pt in AM to mobilize, refused at the time and told MS to check back in afternoon. On 2nd attempt, pt refused stating she still needed to have a BM before she was going to walk w/ MS. Told MS it is best to come tomorrow.   Caesar Bookman Mobility Specialist Please contact via SecureChat or [employer] [phone]

## 2023-03-09 NOTE — Progress Notes (Signed)
 Rounding Note    Patient Name: Felicia Benson Date of Encounter: 03/09/2023  [facility] Cardiologist: None   Subjective   Net -2.4 L yesterday, -3.0 L on admission.  Renal function improving (creatinine 1.29 >1.15 > 1.03).  BP 108/69.  She denies any chest pain or dyspnea  Inpatient Medications    Scheduled Meds:  aspirin EC  81 mg Oral Daily   atorvastatin  40 mg Oral QHS   cholecalciferol  5,000 Units Oral Daily   enoxaparin (LOVENOX) injection  40 mg Subcutaneous Q24H   ferrous sulfate  325 mg Oral Daily   furosemide  80 mg Intravenous BID   lidocaine  1 patch Transdermal Daily   melatonin  3 mg Oral QHS   mesalamine  1,000 mg Oral BID   midodrine  10 mg Oral TID WC   mometasone-formoterol  2 puff Inhalation BID   montelukast  10 mg Oral Daily   pantoprazole  40 mg Oral Daily   polyethylene glycol  17 g Oral Daily   potassium chloride  20 mEq Oral BID   senna-docusate  1 tablet Oral BID   sodium chloride flush  3 mL Intravenous Q12H   Continuous Infusions:  PRN Meds: acetaminophen, albuterol, ondansetron (ZOFRAN) IV, sodium chloride flush   Vital Signs    Vitals:   03/08/23 1943 03/08/23 2114 03/09/23 0343 03/09/23 0500  BP: 119/74  108/69   Pulse: 91  81   Resp: (!) 21  15   Temp: 98.2 F (36.8 C)  97.6 F (36.4 C)   TempSrc: Oral  Oral   SpO2: 100% 94% 94%   Weight:    87 kg  Height:        Intake/Output Summary (Last 24 hours) at 03/09/2023 0837 Last data filed at 03/09/2023 0428 Gross per 24 hour  Intake 118 ml  Output 2550 ml  Net -2432 ml      03/09/2023    5:00 AM 03/08/2023   10:19 AM 03/08/2023    3:25 AM  Last 3 Weights  Weight (lbs) 191 lb 12.8 oz 195 lb 210 lb 6.4 oz  Weight (kg) 87 kg 88.451 kg 95.437 kg      Telemetry    V paced with rate 70s to 80s- Personally Reviewed  ECG    No new ECG- Personally Reviewed  Physical Exam   GEN: No acute distress.   Neck: + JVD Cardiac: RRR, no murmurs, rubs, or gallops.   Respiratory: Diminished breath sounds GI: Soft, nontender, non-distended  MS: 1+ bilateral lower extremity edema; No deformity. Neuro:  Nonfocal  Psych: Normal affect   Labs    High Sensitivity Troponin:   Recent Labs  Lab 02/07/23 2155 02/08/23 0509 03/07/23 1213 03/08/23 0815  TROPONINIHS 23* 23* 48* 45*     Chemistry Recent Labs  Lab 03/07/23 1213 03/08/23 0439 03/09/23 0420  NA 136 139 136  K 4.3 4.7 4.3  CL 97* 99 97*  CO2 32 31 31  GLUCOSE 106* 95 87  BUN 27* 27* 26*  CREATININE 1.29* 1.15* 1.03*  CALCIUM 7.8* 7.9* 7.9*  PROT 5.6*  --   --   ALBUMIN 2.1*  --   --   AST 19  --   --   ALT 14  --   --   ALKPHOS 54  --   --   BILITOT 0.5  --   --   GFRNONAA 39* 45* 52*  ANIONGAP 7 9 8  Lipids No results for input(s): "CHOL", "TRIG", "HDL", "LABVLDL", "LDLCALC", "CHOLHDL" in the last 168 hours.  Hematology Recent Labs  Lab 03/07/23 1213 03/08/23 0439 03/09/23 0420  WBC 6.0 5.6 6.3  RBC 3.14* 3.17* 3.40*  HGB 9.2* 9.2* 9.9*  HCT 30.4* 30.1* 31.8*  MCV 96.8 95.0 93.5  MCH 29.3 29.0 29.1  MCHC 30.3 30.6 31.1  RDW 20.0* 20.1* 20.0*  PLT 248 243 266   Thyroid No results for input(s): "TSH", "FREET4" in the last 168 hours.  BNP Recent Labs  Lab 03/07/23 1213  BNP 1,390.1*    DDimer No results for input(s): "DDIMER" in the last 168 hours.   Radiology    ECHOCARDIOGRAM COMPLETE Result Date: 03/08/2023    ECHOCARDIOGRAM REPORT   Patient Name:   Felicia Benson Date of Exam: 03/08/2023 Medical Rec #:  [id number]        Height:       66.0 in Accession #:    [id number]       Weight:       210.4 lb Date of Birth:  11/17/1930         BSA:          2.043 m Patient Age:    89 years         BP:           121/67 mmHg Patient Gender: F                HR:           89 bpm. Exam Location:  Inpatient Procedure: 2D Echo, Cardiac Doppler and Color Doppler (Both Spectral and Color            Flow Doppler were utilized during procedure). Indications:    CHF  History:         Patient has prior history of Echocardiogram examinations, most                 recent 07/23/2022. CHF, CAD, COPD and TIA,                 Signs/Symptoms:Shortness of Breath; Risk Factors:Hypertension                 and Dyslipidemia. CKD stge 3.  Sonographer:    Vern Claude Referring Phys: [id number] AVA SWAYZE IMPRESSIONS  1. Left ventricular ejection fraction, by estimation, is 50 to 55%. The left ventricle has low normal function. Left ventricular endocardial border not optimally defined to evaluate regional wall motion. Left ventricular diastolic parameters are indeterminate.  2. D-shaped septum suggestive of RV pressure/volume overload. Right ventricular systolic function is severely reduced. The right ventricular size is moderately enlarged. There is moderately elevated pulmonary artery systolic pressure. The estimated right ventricular systolic pressure is 59.6 mmHg.  3. Right atrial size was mildly dilated.  4. The mitral valve is normal in structure. No evidence of mitral valve regurgitation. No evidence of mitral stenosis.  5. The aortic valve is tricuspid. There is mild calcification of the aortic valve. Aortic valve regurgitation is trivial. No aortic stenosis is present.  6. Tricuspid valve regurgitation is moderate.  7. Aortic dilatation noted. There is mild dilatation of the ascending aorta, measuring 43 mm.  8. The inferior vena cava is normal in size with greater than 50% respiratory variability, suggesting right atrial pressure of 3 mmHg.  9. A small pericardial effusion is present. The pericardial effusion is posterior and lateral to the left ventricle. FINDINGS  Left Ventricle:  Left ventricular ejection fraction, by estimation, is 50 to 55%. The left ventricle has low normal function. Left ventricular endocardial border not optimally defined to evaluate regional wall motion. The left ventricular internal cavity  size was normal in size. There is no left ventricular hypertrophy. Left ventricular  diastolic parameters are indeterminate. Right Ventricle: D-shaped septum suggestive of RV pressure/volume overload. The right ventricular size is moderately enlarged. No increase in right ventricular wall thickness. Right ventricular systolic function is severely reduced. There is moderately elevated pulmonary artery systolic pressure. The tricuspid regurgitant velocity is 3.76 m/s, and with an assumed right atrial pressure of 3 mmHg, the estimated right ventricular systolic pressure is 59.6 mmHg. Left Atrium: Left atrial size was normal in size. Right Atrium: Right atrial size was mildly dilated. Pericardium: A small pericardial effusion is present. The pericardial effusion is posterior and lateral to the left ventricle. Mitral Valve: The mitral valve is normal in structure. Mild mitral annular calcification. No evidence of mitral valve regurgitation. No evidence of mitral valve stenosis. MV peak gradient, 6.1 mmHg. The mean mitral valve gradient is 2.0 mmHg. Tricuspid Valve: The tricuspid valve is normal in structure. Tricuspid valve regurgitation is moderate. Aortic Valve: The aortic valve is tricuspid. There is mild calcification of the aortic valve. Aortic valve regurgitation is trivial. No aortic stenosis is present. Aortic valve mean gradient measures 3.0 mmHg. Aortic valve peak gradient measures 6.1 mmHg. Aortic valve area, by VTI measures 2.84 cm. Pulmonic Valve: The pulmonic valve was normal in structure. Pulmonic valve regurgitation is mild. Aorta: Aortic dilatation noted. There is mild dilatation of the ascending aorta, measuring 43 mm. Venous: The inferior vena cava is normal in size with greater than 50% respiratory variability, suggesting right atrial pressure of 3 mmHg. IAS/Shunts: No atrial level shunt detected by color flow Doppler. Additional Comments: A device lead is visualized in the right ventricle.  LEFT VENTRICLE PLAX 2D LVIDd:         4.00 cm     Diastology LVIDs:         2.50 cm     LV e'  lateral:   14.50 cm/s LV PW:         0.90 cm     LV E/e' lateral: 8.8 LV IVS:        0.90 cm LVOT diam:     2.00 cm LV SV:         50 LV SV Index:   25 LVOT Area:     3.14 cm  LV Volumes (MOD) LV vol d, MOD A2C: 93.6 ml LV vol d, MOD A4C: 90.8 ml LV vol s, MOD A2C: 32.5 ml LV vol s, MOD A4C: 29.3 ml LV SV MOD A2C:     61.1 ml LV SV MOD A4C:     90.8 ml LV SV MOD BP:      63.8 ml RIGHT VENTRICLE            IVC RV Basal diam:  4.10 cm    IVC diam: 1.00 cm RV Mid diam:    4.00 cm RV S prime:     6.53 cm/s TAPSE (M-mode): 1.1 cm LEFT ATRIUM             Index        RIGHT ATRIUM           Index LA diam:        2.50 cm 1.22 cm/m   RA Area:     20.90 cm  LA Vol (A2C):   42.3 ml 20.70 ml/m  RA Volume:   52.70 ml  25.79 ml/m LA Vol (A4C):   32.9 ml 16.10 ml/m LA Biplane Vol: 37.3 ml 18.25 ml/m  AORTIC VALVE                    PULMONIC VALVE AV Area (Vmax):    2.21 cm     PV Vmax:          0.94 m/s AV Area (Vmean):   2.63 cm     PV Peak grad:     3.6 mmHg AV Area (VTI):     2.84 cm     PR End Diast Vel: 9.73 msec AV Vmax:           123.00 cm/s AV Vmean:          75.200 cm/s AV VTI:            0.177 m AV Peak Grad:      6.1 mmHg AV Mean Grad:      3.0 mmHg LVOT Vmax:         86.60 cm/s LVOT Vmean:        62.900 cm/s LVOT VTI:          0.160 m LVOT/AV VTI ratio: 0.90  AORTA Ao Root diam: 3.60 cm Ao Asc diam:  4.10 cm MITRAL VALVE                TRICUSPID VALVE MV Area (PHT): 6.77 cm     TR Peak grad:   56.6 mmHg MV Area VTI:   3.06 cm     TR Vmax:        376.00 cm/s MV Peak grad:  6.1 mmHg MV Mean grad:  2.0 mmHg     SHUNTS MV Vmax:       1.23 m/s     Systemic VTI:  0.16 m MV Vmean:      69.2 cm/s    Systemic Diam: 2.00 cm MV Decel Time: 112 msec MV E velocity: 127.00 cm/s Dalton McleanMD Electronically signed by Wilfred Lacy Signature Date/Time: 03/08/2023/9:26:51 AM    Final    DG Chest Port 1 View Result Date: 03/07/2023 CLINICAL DATA:  Shortness of breath.  Weakness. EXAM: PORTABLE CHEST 1 VIEW COMPARISON:   02/07/2023. FINDINGS: Re-demonstration of left retrocardiac airspace opacity obscuring the left hemidiaphragm, descending thoracic aorta and blunting the left lateral costophrenic angle, suggesting combination of left lung atelectasis and/or consolidation with pleural effusion. No significant interval change. There is probable small layering right pleural effusion as well, similar to the prior study. Bilateral lung fields are otherwise clear. Normal cardio-mediastinal silhouette. There is a left sided 2-lead pacemaker. No acute osseous abnormalities. Lower cervical spinal fixation hardware noted. Mild asymmetric fullness over the right lower neck corresponds to goiter on the prior CT scan chest. The soft tissues are otherwise within normal limits. IMPRESSION: *No significant interval change since the prior study. Persistent left retrocardiac opacity and small layering right pleural effusion, as described above. Electronically Signed   By: Jules Schick M.D.   On: 03/07/2023 14:21    Cardiac Studies     Patient Profile     89 y.o. female with a history of HFpEF, CHB status post PPM 07/2022, pulmonary hypertension, CAD status post PCI of RCA and repeat PCI 2017 for ISR., acute PE/DVT status post IVC filter, AAA, hypertension, CKD, Crohn's disease with recurrent diverticular bleed 2020, recent admission for CAP  with chronic respiratory failure on supplemental O2 at 4 L at home who presents with CHF  Assessment & Plan    Acute on chronic diastolic heart failure/RV failure:.  Echocardiogram shows EF 50 to 55%, severely reduced RV function, RVSP 60, moderate TR.  Worsening RV failure likely due to recent PE.  Hypoalbuminemia likely contributing to volume overload as well -Diuresing well.  Remains hypervolemic on exam, continue IV Lasix 80 mg twice daily -Soft BP, on midodrine 10 mg 3 times daily  Acute on chronic hypoxic respiratory failure: Likely multifactorial with diastolic heart failure and COPD  contributing as well as recent PE  Complete heart block: status post PPM.  In V paced rhythm  CAD: Status post RCA stent in 2017.  Mildly elevated and flat troponin (48 > 45) not consistent with ACS and likely due to demand ischemia in setting of heart failure exacerbation.  Continue aspirin, statin.  Holding home metoprolol due to hypotension  Recent PE/DVT: Diagnosed during admission last month.  Status post IVC filter given history of GI bleeding  For questions or updates, please contact [facility] Please consult [email or web address] for contact info under        Signed, Little Ishikawa, MD  03/09/2023, 8:37 AM

## 2023-03-09 NOTE — Progress Notes (Addendum)
 [facility]                                                                              Felicia Benson, is a 89 y.o. female, DOB - 1930/01/12, ZOX:[id number] Admit date - 03/07/2023    Outpatient Primary MD for the patient is [name], MD  LOS - 2  days  Chief Complaint  Patient presents with   Weakness       Brief summary   Patient is a 89 year old female with chronic diastolic CHF, PE/DVT status post IVC filter, HTN, HLP, COPD, CAD status post PCI of RCA, chronic respiratory failure with hypoxia on 4 L O2 via [place] at home with recent admission for CAP PNA, PE/LLE DVT status post IVC filter (02/07/2023-02/19/2023) presented with shortness of breath, noted to be hypoxic at SNF with O2 sats as low as 77%.  Patient was placed on 8 L O2 via [facility] in the ED.  Patient also noted to have residual nonproductive cough, significant lower extremity swelling, received Lasix 40 mg IV x 1 in ED.  She was also hypotensive on arrival and was placed on midodrine 10 mg 3 times daily.  No acute chest pain or shortness of breath. Chest x-ray showed small layering right pleural effusion, troponin 48-45, BNP 1390.   Subjective:   Feels a little better, still looks some shortness of breath, on 4 L O2 this morning  Assessment & Plan   Acute on chronic respiratory failure with hypoxia (HCC) -Multifactorial with acute on chronic diastolic CHF, COPD, pulmonary hypertension, recent multifocal pneumonia, recent segmental PE -Diuretics as noted below, wean O2 as tolerated, has been on up to 4 L recently  Acute on chronic diastolic CHF, RV failure Moderate to severe pulmonary hypertension Coronary artery disease - 2D echo showed EF 50 to 55% with D-shaped IVS consistent with pressure and volume overload, severe RV dysfunction, moderate to severe pulmonary hypertension -Complicated by hypoalbuminemia -2.1 and third spacing  -Continue Lasix 80 Mg twice daily, midodrine  -GDMT limited by  hypotension  -Increase activity, PT eval    COPD (chronic obstructive pulmonary disease) (HCC) Recent multifocal pneumonia -Known COPD with smoking history -Monitor, no wheezing at this time  History of recent pulmonary embolism, subsegmental PE, LLE DVT -During previous admission, status post IVC filter.  Was not able to tolerate anticoagulation, has history of recurrent diverticular bleeds  History of complete heart block -Status post PPM   Essential hypertension -Soft but stable, continue midodrine 10 mg 3 times daily  History of Crohn's disease, GERD -Continue Pentasa -Continue Protonix    Anemia of chronic disease -Hemoglobin 9.2, at baseline.  Hemoglobin was 8.8 on discharge on 02/14/2023  Obesity class II Estimated body mass index is 30.96 kg/m as calculated from the following:   Height as of this encounter: 5\' 6"  (1.676 m).   Weight as of this encounter: 87 kg.  Goals of care -Multiple comorbid conditions, recurrent admissions, Dr. Isidoro Donning yesterday requested palliative medicine consult for GOC.   Code Status: DNR DVT Prophylaxis:  enoxaparin (LOVENOX) injection 40 mg Start: 03/07/23 2200 Family Communication: No family at bedside, discussed with  patient in detail Disposition Plan:   SNF once stable   Procedures:  2D echo  Consultants:   Cardiology Palliative medicine  Antimicrobials:   Anti-infectives (From admission, onward)    None          Medications  aspirin EC  81 mg Oral Daily   atorvastatin  40 mg Oral QHS   cholecalciferol  5,000 Units Oral Daily   enoxaparin (LOVENOX) injection  40 mg Subcutaneous Q24H   ferrous sulfate  325 mg Oral Daily   furosemide  80 mg Intravenous BID   lidocaine  1 patch Transdermal Daily   melatonin  3 mg Oral QHS   mesalamine  1,000 mg Oral BID   midodrine  10 mg Oral TID WC   mometasone-formoterol  2 puff Inhalation BID   montelukast  10 mg Oral Daily   pantoprazole  40 mg Oral Daily   polyethylene glycol   17 g Oral Daily   potassium chloride  20 mEq Oral BID   senna-docusate  1 tablet Oral BID   sodium chloride flush  3 mL Intravenous Q12H     Objective:   Vitals:   03/08/23 1943 03/08/23 2114 03/09/23 0343 03/09/23 0500  BP: 119/74  108/69   Pulse: 91  81   Resp: (!) 21  15   Temp: 98.2 F (36.8 C)  97.6 F (36.4 C)   TempSrc: Oral  Oral   SpO2: 100% 94% 94%   Weight:    87 kg  Height:        Intake/Output Summary (Last 24 hours) at 03/09/2023 1051 Last data filed at 03/09/2023 0857 Gross per 24 hour  Intake 238 ml  Output 2550 ml  Net -2312 ml     Wt Readings from Last 3 Encounters:  03/09/23 87 kg  02/07/23 85.3 kg  12/28/22 85.3 kg     Exam Obese chronically ill female laying in bed, AAOx3 HEENT: Positive JVD CVS: S1-S2, regular rhythm Lungs: Decreased breath sounds at the bases Abdomen: Soft, nontender, bowel sounds present Extremities: 2+ edema, chronic skin changes    Data Reviewed:  I have personally reviewed following labs    CBC Lab Results  Component Value Date   WBC 6.3 03/09/2023   RBC 3.40 (L) 03/09/2023   HGB 9.9 (L) 03/09/2023   HCT 31.8 (L) 03/09/2023   MCV 93.5 03/09/2023   MCH 29.1 03/09/2023   PLT 266 03/09/2023   MCHC 31.1 03/09/2023   RDW 20.0 (H) 03/09/2023   LYMPHSABS 0.9 03/09/2023   MONOABS 0.7 03/09/2023   EOSABS 0.2 03/09/2023   BASOSABS 0.0 03/09/2023     Last metabolic panel Lab Results  Component Value Date   NA 136 03/09/2023   K 4.3 03/09/2023   CL 97 (L) 03/09/2023   CO2 31 03/09/2023   BUN 26 (H) 03/09/2023   CREATININE 1.03 (H) 03/09/2023   GLUCOSE 87 03/09/2023   GFRNONAA 52 (L) 03/09/2023   GFRAA 44 (L) 05/21/2019   CALCIUM 7.9 (L) 03/09/2023   PHOS 3.5 02/19/2023   PROT 5.6 (L) 03/07/2023   ALBUMIN 2.1 (L) 03/07/2023   BILITOT 0.5 03/07/2023   ALKPHOS 54 03/07/2023   AST 19 03/07/2023   ALT 14 03/07/2023   ANIONGAP 8 03/09/2023    CBG (last 3)  No results for input(s): "GLUCAP" in the  last 72 hours.    Coagulation Profile: No results for input(s): "INR", "PROTIME" in the last 168 hours.   Radiology Studies: I have  personally reviewed the imaging studies  ECHOCARDIOGRAM COMPLETE Result Date: 03/08/2023    ECHOCARDIOGRAM REPORT   Patient Name:   Felicia Benson Date of Exam: 03/08/2023 Medical Rec #:  [id number]        Height:       66.0 in Accession #:    [id number]       Weight:       210.4 lb Date of Birth:  October 30, 1930         BSA:          2.043 m Patient Age:    89 years         BP:           121/67 mmHg Patient Gender: F                HR:           89 bpm. Exam Location:  Inpatient Procedure: 2D Echo, Cardiac Doppler and Color Doppler (Both Spectral and Color            Flow Doppler were utilized during procedure). Indications:    CHF  History:        Patient has prior history of Echocardiogram examinations, most                 recent 07/23/2022. CHF, CAD, COPD and TIA,                 Signs/Symptoms:Shortness of Breath; Risk Factors:Hypertension                 and Dyslipidemia. CKD stge 3.  Sonographer:    Vern Claude Referring Phys: [id number] AVA SWAYZE IMPRESSIONS  1. Left ventricular ejection fraction, by estimation, is 50 to 55%. The left ventricle has low normal function. Left ventricular endocardial border not optimally defined to evaluate regional wall motion. Left ventricular diastolic parameters are indeterminate.  2. D-shaped septum suggestive of RV pressure/volume overload. Right ventricular systolic function is severely reduced. The right ventricular size is moderately enlarged. There is moderately elevated pulmonary artery systolic pressure. The estimated right ventricular systolic pressure is 59.6 mmHg.  3. Right atrial size was mildly dilated.  4. The mitral valve is normal in structure. No evidence of mitral valve regurgitation. No evidence of mitral stenosis.  5. The aortic valve is tricuspid. There is mild calcification of the aortic valve. Aortic valve regurgitation  is trivial. No aortic stenosis is present.  6. Tricuspid valve regurgitation is moderate.  7. Aortic dilatation noted. There is mild dilatation of the ascending aorta, measuring 43 mm.  8. The inferior vena cava is normal in size with greater than 50% respiratory variability, suggesting right atrial pressure of 3 mmHg.  9. A small pericardial effusion is present. The pericardial effusion is posterior and lateral to the left ventricle. FINDINGS  Left Ventricle: Left ventricular ejection fraction, by estimation, is 50 to 55%. The left ventricle has low normal function. Left ventricular endocardial border not optimally defined to evaluate regional wall motion. The left ventricular internal cavity  size was normal in size. There is no left ventricular hypertrophy. Left ventricular diastolic parameters are indeterminate. Right Ventricle: D-shaped septum suggestive of RV pressure/volume overload. The right ventricular size is moderately enlarged. No increase in right ventricular wall thickness. Right ventricular systolic function is severely reduced. There is moderately elevated pulmonary artery systolic pressure. The tricuspid regurgitant velocity is 3.76 m/s, and with an assumed right atrial pressure of 3 mmHg, the estimated right ventricular systolic  pressure is 59.6 mmHg. Left Atrium: Left atrial size was normal in size. Right Atrium: Right atrial size was mildly dilated. Pericardium: A small pericardial effusion is present. The pericardial effusion is posterior and lateral to the left ventricle. Mitral Valve: The mitral valve is normal in structure. Mild mitral annular calcification. No evidence of mitral valve regurgitation. No evidence of mitral valve stenosis. MV peak gradient, 6.1 mmHg. The mean mitral valve gradient is 2.0 mmHg. Tricuspid Valve: The tricuspid valve is normal in structure. Tricuspid valve regurgitation is moderate. Aortic Valve: The aortic valve is tricuspid. There is mild calcification of the  aortic valve. Aortic valve regurgitation is trivial. No aortic stenosis is present. Aortic valve mean gradient measures 3.0 mmHg. Aortic valve peak gradient measures 6.1 mmHg. Aortic valve area, by VTI measures 2.84 cm. Pulmonic Valve: The pulmonic valve was normal in structure. Pulmonic valve regurgitation is mild. Aorta: Aortic dilatation noted. There is mild dilatation of the ascending aorta, measuring 43 mm. Venous: The inferior vena cava is normal in size with greater than 50% respiratory variability, suggesting right atrial pressure of 3 mmHg. IAS/Shunts: No atrial level shunt detected by color flow Doppler. Additional Comments: A device lead is visualized in the right ventricle.  LEFT VENTRICLE PLAX 2D LVIDd:         4.00 cm     Diastology LVIDs:         2.50 cm     LV e' lateral:   14.50 cm/s LV PW:         0.90 cm     LV E/e' lateral: 8.8 LV IVS:        0.90 cm LVOT diam:     2.00 cm LV SV:         50 LV SV Index:   25 LVOT Area:     3.14 cm  LV Volumes (MOD) LV vol d, MOD A2C: 93.6 ml LV vol d, MOD A4C: 90.8 ml LV vol s, MOD A2C: 32.5 ml LV vol s, MOD A4C: 29.3 ml LV SV MOD A2C:     61.1 ml LV SV MOD A4C:     90.8 ml LV SV MOD BP:      63.8 ml RIGHT VENTRICLE            IVC RV Basal diam:  4.10 cm    IVC diam: 1.00 cm RV Mid diam:    4.00 cm RV S prime:     6.53 cm/s TAPSE (M-mode): 1.1 cm LEFT ATRIUM             Index        RIGHT ATRIUM           Index LA diam:        2.50 cm 1.22 cm/m   RA Area:     20.90 cm LA Vol (A2C):   42.3 ml 20.70 ml/m  RA Volume:   52.70 ml  25.79 ml/m LA Vol (A4C):   32.9 ml 16.10 ml/m LA Biplane Vol: 37.3 ml 18.25 ml/m  AORTIC VALVE                    PULMONIC VALVE AV Area (Vmax):    2.21 cm     PV Vmax:          0.94 m/s AV Area (Vmean):   2.63 cm     PV Peak grad:     3.6 mmHg AV Area (VTI):     2.84 cm  PR End Diast Vel: 9.73 msec AV Vmax:           123.00 cm/s AV Vmean:          75.200 cm/s AV VTI:            0.177 m AV Peak Grad:      6.1 mmHg AV Mean Grad:       3.0 mmHg LVOT Vmax:         86.60 cm/s LVOT Vmean:        62.900 cm/s LVOT VTI:          0.160 m LVOT/AV VTI ratio: 0.90  AORTA Ao Root diam: 3.60 cm Ao Asc diam:  4.10 cm MITRAL VALVE                TRICUSPID VALVE MV Area (PHT): 6.77 cm     TR Peak grad:   56.6 mmHg MV Area VTI:   3.06 cm     TR Vmax:        376.00 cm/s MV Peak grad:  6.1 mmHg MV Mean grad:  2.0 mmHg     SHUNTS MV Vmax:       1.23 m/s     Systemic VTI:  0.16 m MV Vmean:      69.2 cm/s    Systemic Diam: 2.00 cm MV Decel Time: 112 msec MV E velocity: 127.00 cm/s Dalton McleanMD Electronically signed by Wilfred Lacy Signature Date/Time: 03/08/2023/9:26:51 AM    Final    DG Chest Port 1 View Result Date: 03/07/2023 CLINICAL DATA:  Shortness of breath.  Weakness. EXAM: PORTABLE CHEST 1 VIEW COMPARISON:  02/07/2023. FINDINGS: Re-demonstration of left retrocardiac airspace opacity obscuring the left hemidiaphragm, descending thoracic aorta and blunting the left lateral costophrenic angle, suggesting combination of left lung atelectasis and/or consolidation with pleural effusion. No significant interval change. There is probable small layering right pleural effusion as well, similar to the prior study. Bilateral lung fields are otherwise clear. Normal cardio-mediastinal silhouette. There is a left sided 2-lead pacemaker. No acute osseous abnormalities. Lower cervical spinal fixation hardware noted. Mild asymmetric fullness over the right lower neck corresponds to goiter on the prior CT scan chest. The soft tissues are otherwise within normal limits. IMPRESSION: *No significant interval change since the prior study. Persistent left retrocardiac opacity and small layering right pleural effusion, as described above. Electronically Signed   By: Jules Schick M.D.   On: 03/07/2023 14:21       Zannie Cove M.D. [facility] 03/09/2023, 10:51 AM  Available via Epic secure chat 7am-7pm After 7 pm, please refer to night coverage  provider listed on amion.

## 2023-03-09 NOTE — Progress Notes (Signed)
 Patient has refused our Cpap for the night. States that her daughter will bring in her home unit due to patient not tolerating our equipment. Advised to call RT if she changes her mind. NAD

## 2023-03-10 ENCOUNTER — Encounter (HOSPITAL_COMMUNITY): Payer: Self-pay | Admitting: Internal Medicine

## 2023-03-10 DIAGNOSIS — J9621 Acute and chronic respiratory failure with hypoxia: Secondary | ICD-10-CM | POA: Diagnosis not present

## 2023-03-10 DIAGNOSIS — Z7189 Other specified counseling: Secondary | ICD-10-CM

## 2023-03-10 DIAGNOSIS — Z515 Encounter for palliative care: Secondary | ICD-10-CM

## 2023-03-10 DIAGNOSIS — I5033 Acute on chronic diastolic (congestive) heart failure: Secondary | ICD-10-CM | POA: Diagnosis not present

## 2023-03-10 LAB — CBC WITH DIFFERENTIAL/PLATELET
Abs Immature Granulocytes: 0.02 10*3/uL (ref 0.00–0.07)
Basophils Absolute: 0 10*3/uL (ref 0.0–0.1)
Basophils Relative: 0 %
Eosinophils Absolute: 0.1 10*3/uL (ref 0.0–0.5)
Eosinophils Relative: 2 %
HCT: 31.7 % — ABNORMAL LOW (ref 36.0–46.0)
Hemoglobin: 9.9 g/dL — ABNORMAL LOW (ref 12.0–15.0)
Immature Granulocytes: 0 %
Lymphocytes Relative: 15 %
Lymphs Abs: 0.9 10*3/uL (ref 0.7–4.0)
MCH: 29.2 pg (ref 26.0–34.0)
MCHC: 31.2 g/dL (ref 30.0–36.0)
MCV: 93.5 fL (ref 80.0–100.0)
Monocytes Absolute: 0.7 10*3/uL (ref 0.1–1.0)
Monocytes Relative: 11 %
Neutro Abs: 4.5 10*3/uL (ref 1.7–7.7)
Neutrophils Relative %: 72 %
Platelets: 275 10*3/uL (ref 150–400)
RBC: 3.39 MIL/uL — ABNORMAL LOW (ref 3.87–5.11)
RDW: 19.9 % — ABNORMAL HIGH (ref 11.5–15.5)
WBC: 6.2 10*3/uL (ref 4.0–10.5)
nRBC: 0 % (ref 0.0–0.2)

## 2023-03-10 LAB — BASIC METABOLIC PANEL
Anion gap: 11 (ref 5–15)
BUN: 27 mg/dL — ABNORMAL HIGH (ref 8–23)
CO2: 31 mmol/L (ref 22–32)
Calcium: 8.2 mg/dL — ABNORMAL LOW (ref 8.9–10.3)
Chloride: 95 mmol/L — ABNORMAL LOW (ref 98–111)
Creatinine, Ser: 1.15 mg/dL — ABNORMAL HIGH (ref 0.44–1.00)
GFR, Estimated: 45 mL/min — ABNORMAL LOW (ref >60)
Glucose, Bld: 95 mg/dL (ref 70–99)
Potassium: 4 mmol/L (ref 3.5–5.1)
Sodium: 137 mmol/L (ref 135–145)

## 2023-03-10 LAB — MAGNESIUM: Magnesium: 1.8 mg/dL (ref 1.7–2.4)

## 2023-03-10 MED ORDER — MAGNESIUM SULFATE 2 GM/50ML IV SOLN
2.0000 g | Freq: Once | INTRAVENOUS | Status: AC
  Administered 2023-03-10: 2 g via INTRAVENOUS
  Filled 2023-03-10: qty 50

## 2023-03-10 MED ORDER — TORSEMIDE 20 MG PO TABS
40.0000 mg | ORAL_TABLET | Freq: Every day | ORAL | Status: DC
  Administered 2023-03-10: 40 mg via ORAL
  Filled 2023-03-10: qty 2

## 2023-03-10 NOTE — Progress Notes (Signed)
 Mobility Specialist Progress Note;   03/10/23 1320  Mobility  Activity Ambulated with assistance in room  Level of Assistance Contact guard assist, steadying assist  Assistive Device Front wheel walker  Distance Ambulated (ft) 20 ft  Activity Response Tolerated well  Mobility Referral Yes  Mobility visit 1 Mobility  Mobility Specialist Start Time (ACUTE ONLY) 1320  Mobility Specialist Stop Time (ACUTE ONLY) 1345  Mobility Specialist Time Calculation (min) (ACUTE ONLY) 25 min   Pt agreeable to mobility w/ encouragement. On 3LO2 upon arrival. Required MinG assistance during ambulation for safety. Ambulated in room on 3LO2, VSS when able to receive accurate pleth reading. No c/o when asked. Pt returned back to bed with all needs met, alarm on.  Caesar Bookman Mobility Specialist Please contact via SecureChat or [employer] [phone]

## 2023-03-10 NOTE — Progress Notes (Signed)
 Rounding Note    Patient Name: Felicia Benson Date of Encounter: 03/10/2023  [facility] Cardiologist: None   Subjective   Net -3.6 L yesterday, -6.7 L on admission.  Renal function stable (creatinine 1.29 >1.15 > 1.03 > 1.15).  BP 130/73.  She denies any chest pain or dyspnea  Inpatient Medications    Scheduled Meds:  aspirin EC  81 mg Oral Daily   atorvastatin  40 mg Oral QHS   cholecalciferol  5,000 Units Oral Daily   enoxaparin (LOVENOX) injection  40 mg Subcutaneous Q24H   ferrous sulfate  325 mg Oral Daily   furosemide  80 mg Intravenous BID   lidocaine  1 patch Transdermal Daily   melatonin  3 mg Oral QHS   mesalamine  1,000 mg Oral BID   midodrine  10 mg Oral TID WC   mometasone-formoterol  2 puff Inhalation BID   montelukast  10 mg Oral Daily   pantoprazole  40 mg Oral Daily   polyethylene glycol  17 g Oral Daily   potassium chloride  20 mEq Oral BID   senna-docusate  1 tablet Oral BID   sodium chloride flush  3 mL Intravenous Q12H   Continuous Infusions:  PRN Meds: acetaminophen, albuterol, ondansetron (ZOFRAN) IV, sodium chloride flush   Vital Signs    Vitals:   03/09/23 2027 03/10/23 0614 03/10/23 0623 03/10/23 0811  BP:  130/73    Pulse:  94    Resp:  19    Temp:  (!) 97.5 F (36.4 C)    TempSrc:  Tympanic    SpO2: 97% 99%  99%  Weight:   85.6 kg   Height:        Intake/Output Summary (Last 24 hours) at 03/10/2023 0812 Last data filed at 03/10/2023 6045 Gross per 24 hour  Intake 360 ml  Output 4000 ml  Net -3640 ml      03/10/2023    6:23 AM 03/09/2023    5:00 AM 03/08/2023   10:19 AM  Last 3 Weights  Weight (lbs) 188 lb 12.8 oz 191 lb 12.8 oz 195 lb  Weight (kg) 85.639 kg 87 kg 88.451 kg      Telemetry    V paced with rate 70s to 80s- Personally Reviewed  ECG    No new ECG- Personally Reviewed  Physical Exam   GEN: No acute distress.   Neck: + JVD Cardiac: RRR, no murmurs, rubs, or gallops.  Respiratory: Diminished  breath sounds GI: Soft, nontender, non-distended  MS: 1+ bilateral lower extremity edema; No deformity. Neuro:  Nonfocal  Psych: Normal affect   Labs    High Sensitivity Troponin:   Recent Labs  Lab 03/07/23 1213 03/08/23 0815  TROPONINIHS 48* 45*     Chemistry Recent Labs  Lab 03/07/23 1213 03/08/23 0439 03/09/23 0420 03/10/23 0342  NA 136 139 136 137  K 4.3 4.7 4.3 4.0  CL 97* 99 97* 95*  CO2 32 31 31 31   GLUCOSE 106* 95 87 95  BUN 27* 27* 26* 27*  CREATININE 1.29* 1.15* 1.03* 1.15*  CALCIUM 7.8* 7.9* 7.9* 8.2*  MG  --   --   --  1.8  PROT 5.6*  --   --   --   ALBUMIN 2.1*  --   --   --   AST 19  --   --   --   ALT 14  --   --   --   [facility]  54  --   --   --   BILITOT 0.5  --   --   --   GFRNONAA 39* 45* 52* 45*  ANIONGAP 7 9 8 11     Lipids No results for input(s): "CHOL", "TRIG", "HDL", "LABVLDL", "LDLCALC", "CHOLHDL" in the last 168 hours.  Hematology Recent Labs  Lab 03/08/23 0439 03/09/23 0420 03/10/23 0342  WBC 5.6 6.3 6.2  RBC 3.17* 3.40* 3.39*  HGB 9.2* 9.9* 9.9*  HCT 30.1* 31.8* 31.7*  MCV 95.0 93.5 93.5  MCH 29.0 29.1 29.2  MCHC 30.6 31.1 31.2  RDW 20.1* 20.0* 19.9*  PLT 243 266 275   Thyroid No results for input(s): "TSH", "FREET4" in the last 168 hours.  BNP Recent Labs  Lab 03/07/23 1213  BNP 1,390.1*    DDimer No results for input(s): "DDIMER" in the last 168 hours.   Radiology    ECHOCARDIOGRAM COMPLETE Result Date: 03/08/2023    ECHOCARDIOGRAM REPORT   Patient Name:   DERRICA SIEG Date of Exam: 03/08/2023 Medical Rec #:  [id number]        Height:       66.0 in Accession #:    [id number]       Weight:       210.4 lb Date of Birth:  Jan 30, 1930         BSA:          2.043 m Patient Age:    89 years         BP:           121/67 mmHg Patient Gender: F                HR:           89 bpm. Exam Location:  Inpatient Procedure: 2D Echo, Cardiac Doppler and Color Doppler (Both Spectral and Color            Flow Doppler were utilized during  procedure). Indications:    CHF  History:        Patient has prior history of Echocardiogram examinations, most                 recent 07/23/2022. CHF, CAD, COPD and TIA,                 Signs/Symptoms:Shortness of Breath; Risk Factors:Hypertension                 and Dyslipidemia. CKD stge 3.  Sonographer:    Vern Claude Referring Phys: [id number] AVA SWAYZE IMPRESSIONS  1. Left ventricular ejection fraction, by estimation, is 50 to 55%. The left ventricle has low normal function. Left ventricular endocardial border not optimally defined to evaluate regional wall motion. Left ventricular diastolic parameters are indeterminate.  2. D-shaped septum suggestive of RV pressure/volume overload. Right ventricular systolic function is severely reduced. The right ventricular size is moderately enlarged. There is moderately elevated pulmonary artery systolic pressure. The estimated right ventricular systolic pressure is 59.6 mmHg.  3. Right atrial size was mildly dilated.  4. The mitral valve is normal in structure. No evidence of mitral valve regurgitation. No evidence of mitral stenosis.  5. The aortic valve is tricuspid. There is mild calcification of the aortic valve. Aortic valve regurgitation is trivial. No aortic stenosis is present.  6. Tricuspid valve regurgitation is moderate.  7. Aortic dilatation noted. There is mild dilatation of the ascending aorta, measuring 43 mm.  8. The inferior vena cava is normal in  size with greater than 50% respiratory variability, suggesting right atrial pressure of 3 mmHg.  9. A small pericardial effusion is present. The pericardial effusion is posterior and lateral to the left ventricle. FINDINGS  Left Ventricle: Left ventricular ejection fraction, by estimation, is 50 to 55%. The left ventricle has low normal function. Left ventricular endocardial border not optimally defined to evaluate regional wall motion. The left ventricular internal cavity  size was normal in size. There is no left  ventricular hypertrophy. Left ventricular diastolic parameters are indeterminate. Right Ventricle: D-shaped septum suggestive of RV pressure/volume overload. The right ventricular size is moderately enlarged. No increase in right ventricular wall thickness. Right ventricular systolic function is severely reduced. There is moderately elevated pulmonary artery systolic pressure. The tricuspid regurgitant velocity is 3.76 m/s, and with an assumed right atrial pressure of 3 mmHg, the estimated right ventricular systolic pressure is 59.6 mmHg. Left Atrium: Left atrial size was normal in size. Right Atrium: Right atrial size was mildly dilated. Pericardium: A small pericardial effusion is present. The pericardial effusion is posterior and lateral to the left ventricle. Mitral Valve: The mitral valve is normal in structure. Mild mitral annular calcification. No evidence of mitral valve regurgitation. No evidence of mitral valve stenosis. MV peak gradient, 6.1 mmHg. The mean mitral valve gradient is 2.0 mmHg. Tricuspid Valve: The tricuspid valve is normal in structure. Tricuspid valve regurgitation is moderate. Aortic Valve: The aortic valve is tricuspid. There is mild calcification of the aortic valve. Aortic valve regurgitation is trivial. No aortic stenosis is present. Aortic valve mean gradient measures 3.0 mmHg. Aortic valve peak gradient measures 6.1 mmHg. Aortic valve area, by VTI measures 2.84 cm. Pulmonic Valve: The pulmonic valve was normal in structure. Pulmonic valve regurgitation is mild. Aorta: Aortic dilatation noted. There is mild dilatation of the ascending aorta, measuring 43 mm. Venous: The inferior vena cava is normal in size with greater than 50% respiratory variability, suggesting right atrial pressure of 3 mmHg. IAS/Shunts: No atrial level shunt detected by color flow Doppler. Additional Comments: A device lead is visualized in the right ventricle.  LEFT VENTRICLE PLAX 2D LVIDd:         4.00 cm      Diastology LVIDs:         2.50 cm     LV e' lateral:   14.50 cm/s LV PW:         0.90 cm     LV E/e' lateral: 8.8 LV IVS:        0.90 cm LVOT diam:     2.00 cm LV SV:         50 LV SV Index:   25 LVOT Area:     3.14 cm  LV Volumes (MOD) LV vol d, MOD A2C: 93.6 ml LV vol d, MOD A4C: 90.8 ml LV vol s, MOD A2C: 32.5 ml LV vol s, MOD A4C: 29.3 ml LV SV MOD A2C:     61.1 ml LV SV MOD A4C:     90.8 ml LV SV MOD BP:      63.8 ml RIGHT VENTRICLE            IVC RV Basal diam:  4.10 cm    IVC diam: 1.00 cm RV Mid diam:    4.00 cm RV S prime:     6.53 cm/s TAPSE (M-mode): 1.1 cm LEFT ATRIUM             Index  RIGHT ATRIUM           Index LA diam:        2.50 cm 1.22 cm/m   RA Area:     20.90 cm LA Vol (A2C):   42.3 ml 20.70 ml/m  RA Volume:   52.70 ml  25.79 ml/m LA Vol (A4C):   32.9 ml 16.10 ml/m LA Biplane Vol: 37.3 ml 18.25 ml/m  AORTIC VALVE                    PULMONIC VALVE AV Area (Vmax):    2.21 cm     PV Vmax:          0.94 m/s AV Area (Vmean):   2.63 cm     PV Peak grad:     3.6 mmHg AV Area (VTI):     2.84 cm     PR End Diast Vel: 9.73 msec AV Vmax:           123.00 cm/s AV Vmean:          75.200 cm/s AV VTI:            0.177 m AV Peak Grad:      6.1 mmHg AV Mean Grad:      3.0 mmHg LVOT Vmax:         86.60 cm/s LVOT Vmean:        62.900 cm/s LVOT VTI:          0.160 m LVOT/AV VTI ratio: 0.90  AORTA Ao Root diam: 3.60 cm Ao Asc diam:  4.10 cm MITRAL VALVE                TRICUSPID VALVE MV Area (PHT): 6.77 cm     TR Peak grad:   56.6 mmHg MV Area VTI:   3.06 cm     TR Vmax:        376.00 cm/s MV Peak grad:  6.1 mmHg MV Mean grad:  2.0 mmHg     SHUNTS MV Vmax:       1.23 m/s     Systemic VTI:  0.16 m MV Vmean:      69.2 cm/s    Systemic Diam: 2.00 cm MV Decel Time: 112 msec MV E velocity: 127.00 cm/s Dalton McleanMD Electronically signed by Wilfred Lacy Signature Date/Time: 03/08/2023/9:26:51 AM    Final     Cardiac Studies     Patient Profile     89 y.o. female with a history of HFpEF, CHB  status post PPM 07/2022, pulmonary hypertension, CAD status post PCI of RCA and repeat PCI 2017 for ISR., acute PE/DVT status post IVC filter, AAA, hypertension, CKD, Crohn's disease with recurrent diverticular bleed 2020, recent admission for CAP with chronic respiratory failure on supplemental O2 at 4 L at home who presents with CHF  Assessment & Plan    Acute on chronic diastolic heart failure/RV failure:.  Echocardiogram shows EF 50 to 55%, severely reduced RV function, RVSP 60, moderate TR.  Worsening RV failure likely due to recent PE.  Hypoalbuminemia likely contributing to volume overload as well -Has diuresed well.  Still with some lower extremity edema but JVD has resolved.  Suspect likely edema is due to low albumin.  Will transition to p.o. torsemide today -Soft BP, on midodrine 10 mg 3 times daily  Acute on chronic hypoxic respiratory failure: Likely multifactorial with diastolic heart failure and COPD contributing as well as recent PE  Complete heart block: status  post PPM.  In V paced rhythm  CAD: Status post RCA stent in 2017.  Mildly elevated and flat troponin (48 > 45) not consistent with ACS and likely due to demand ischemia in setting of heart failure exacerbation.  Continue aspirin, statin.  Holding home metoprolol due to hypotension  Recent PE/DVT: Diagnosed during admission last month.  Status post IVC filter given history of GI bleeding  For questions or updates, please contact [facility] Please consult [email or web address] for contact info under        Signed, Little Ishikawa, MD  03/10/2023, 8:12 AM

## 2023-03-10 NOTE — Consult Note (Signed)
 Consultation Note Date: 03/10/2023   Patient Name: Felicia Benson  DOB: 06/14/1930  MRN: [id number]  Age / Sex: 89 y.o., female  PCP: [name], MD Referring Physician: Zannie Cove, MD  Reason for Consultation: Establishing goals of care  HPI/Patient Profile: 89 y.o. female  with past medical history of recent multifocal pneumonia, recent segmental PE, chronic respiratory failure/COPD on 4 L nasal cannula at baseline, chronic diastolic congestive heart failure, HTN, HLD, anemia of chronic medical disease  admitted on 03/07/2023 with acute on chronic respiratory failure with hypoxia, acute on chronic diastolic heart failure with moderate to severe pulmonary hypertension and CAD with recent history of PE.   Clinical Assessment and Goals of Care: I have reviewed medical records including EPIC notes, labs and imaging, received report from RN, assessed the patient.  [name] is lying quietly in bed with bedside nursing staff attending to needs.  She makes and keeps eye contact.  She is alert and oriented, able to make her needs known.  There is no family at bedside at this time.  We meet at the bedside  to discuss diagnosis prognosis, GOC, EOL wishes, disposition and options.  I introduced Palliative Medicine as specialized medical care for people living with serious illness. It focuses on providing relief from the symptoms and stress of a serious illness. The goal is to improve quality of life for both the patient and the family.  [name] was seen by the palliative team NP Gailen Shelter October 2024.  We discussed a brief life review of the patient.  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  She and her daughter live together.  She shares that she has not been able to remain in her own home since she broke her leg in the fall.  She states that she has been at [facility] for rehab and she and her  daughter are working for long-term care placement.  We then focused on their current illness.  [name] tells me that she has a direct line to her trusted cardiologist and has been speaking with them this morning.  Overall, she is very knowledgeable about her acute and chronic health concerns and the treatment plan.  She denies questions or concerns at this time.  The natural disease trajectory and expectations at EOL were discussed.  Advanced directives, concepts specific to code status, artifical feeding and hydration, and rehospitalization were considered and discussed.  [name] endorses "treat the treatable, but allow a natural passing, DNR.  Discussed the importance of continued conversation with family and the medical providers regarding overall plan of care and treatment options, ensuring decisions are within the context of the patient's values and GOCs.Questions and concerns were addressed.  Hard Choices booklet left for review. The family was encouraged to call with questions or concerns.  PMT will continue to support holistically.  Conference with attending, bedside nursing staff, transition of care team related to patient condition, needs, goals of care, disposition.   HCPOA HCPOA -[name]  shares that her daughter, [name], is her healthcare power of attorney.    SUMMARY OF RECOMMENDATIONS   Continue to treat the treatable but no CPR or intubation Time for outcomes Patient and daughter are seeking long-term care placement [facility]    Code Status/Advance Care Planning: DNR  Symptom Management:  Per hospitalist, no additional needs at this time.  Palliative Prophylaxis:  Frequent Pain Assessment and Oral Care  Additional Recommendations (Limitations, Scope, Preferences): Continue to treat but no CPR or intubation.  Psycho-social/Spiritual:  Desire for further Chaplaincy support:no Additional Recommendations: Caregiving  Support/Resources and Education on  Hospice  Prognosis:  Unable to determine, based on outcomes.  Discharge Planning: Anticipate possible short-term rehab with the ultimate goal of long-term care at [facility].      Primary Diagnoses: Present on Admission:  COPD (chronic obstructive pulmonary disease) (HCC)  Acute on chronic diastolic heart failure (HCC)  Acute on chronic respiratory failure with hypoxia (HCC)  Anemia of chronic disease  Essential hypertension  GERD (gastroesophageal reflux disease)  CKD (chronic kidney disease)  Elevated troponin   I have reviewed the medical record, interviewed the patient and family, and examined the patient. The following aspects are pertinent.  Past Medical History:  Diagnosis Date   AAA (abdominal aortic aneurysm) (HCC)    CHF (congestive heart failure) (HCC)    Crohn's disease (HCC)    Diverticulitis    Diverticulosis    GERD (gastroesophageal reflux disease)    GI bleed    Hypertension    OSA (obstructive sleep apnea)    Pacemaker    Thyroid nodule    Social History   Socioeconomic History   Marital status: Widowed    Spouse name: Not on file   Number of children: Not on file   Years of education: Not on file   Highest education level: Not on file  Occupational History   Not on file  Tobacco Use   Smoking status: Former   Smokeless tobacco: Never  Vaping Use   Vaping status: Never Used  Substance and Sexual Activity   Alcohol use: No    Comment: Quit in 1985   Drug use: No   Sexual activity: Not on file  Other Topics Concern   Not on file  Social History Narrative   Not on file   Social Drivers of Health   Financial Resource Strain: Not on file  Food Insecurity: No Food Insecurity (03/07/2023)   Hunger Vital Sign    Worried About Running Out of Food in the Last Year: Never true    Ran Out of Food in the Last Year: Never true  Transportation Needs: No Transportation Needs (03/07/2023)   PRAPARE - Administrator, Civil Service  (Medical): No    Lack of Transportation (Non-Medical): No  Physical Activity: Not on file  Stress: Not on file  Social Connections: Moderately Isolated (03/07/2023)   Social Connection and Isolation Panel [NHANES]    Frequency of Communication with Friends and Family: More than three times a week    Frequency of Social Gatherings with Friends and Family: More than three times a week    Attends Religious Services: Never    Database administrator or Organizations: No    Attends Banker Meetings: 1 to 4 times per year    Marital Status: Widowed   Family History  Problem Relation Age of Onset   CVA Mother    Lung cancer Father    Colon  cancer Neg Hx    Scheduled Meds:  aspirin EC  81 mg Oral Daily   atorvastatin  40 mg Oral QHS   cholecalciferol  5,000 Units Oral Daily   enoxaparin (LOVENOX) injection  40 mg Subcutaneous Q24H   ferrous sulfate  325 mg Oral Daily   lidocaine  1 patch Transdermal Daily   melatonin  3 mg Oral QHS   mesalamine  1,000 mg Oral BID   midodrine  10 mg Oral TID WC   mometasone-formoterol  2 puff Inhalation BID   montelukast  10 mg Oral Daily   pantoprazole  40 mg Oral Daily   polyethylene glycol  17 g Oral Daily   potassium chloride  20 mEq Oral BID   senna-docusate  1 tablet Oral BID   sodium chloride flush  3 mL Intravenous Q12H   torsemide  40 mg Oral Daily   Continuous Infusions: PRN Meds:.acetaminophen, albuterol, ondansetron (ZOFRAN) IV, sodium chloride flush Medications Prior to Admission:  Prior to Admission medications   Medication Sig Start Date End Date Taking? Authorizing Provider  Amino Acids-Protein Hydrolys (PRO-STAT) LIQD Take 30 mLs by mouth daily.   Yes [provider]  aspirin EC 81 MG tablet Take 1 tablet (81 mg total) by mouth daily. Swallow whole. 03/29/21  Yes [name], MD  atorvastatin (LIPITOR) 40 MG tablet Take 40 mg by mouth at bedtime.   Yes [provider]  benzonatate (TESSALON) 200 MG  capsule Take 200 mg by mouth in the morning, at noon, and at bedtime.   Yes [provider]  Cholecalciferol (VITAMIN D-3 PO) Take 1 capsule by mouth daily.   Yes [provider]  dextromethorphan (DELSYM) 30 MG/5ML liquid Take 2.5 mLs by mouth 2 (two) times daily.   Yes [provider]  docusate sodium (COLACE) 100 MG capsule Take 1 capsule (100 mg total) by mouth daily. 01/04/23  Yes [name], DO  ferrous sulfate 325 (65 FE) MG tablet Take 325 mg by mouth daily.   Yes [provider]  furosemide (LASIX) 40 MG tablet Take 1 tablet (40 mg total) by mouth daily. 02/19/23 03/21/23 Yes Baron Hamper, MD  guaiFENesin (MUCINEX) 600 MG 12 hr tablet Take 600 mg by mouth 2 (two) times daily.   Yes [provider]  Ipratropium-Albuterol (COMBIVENT) 20-100 MCG/ACT AERS respimat Inhale 1 puff into the lungs in the morning and at bedtime.   Yes [provider]  ipratropium-albuterol (DUONEB) 0.5-2.5 (3) MG/3ML SOLN Take 3 mLs by nebulization in the morning, at noon, and at bedtime.   Yes [provider]  lidocaine (ASPERCREME LIDOCAINE) 4 % Place 1 patch onto the skin daily.   Yes [provider]  melatonin 3 MG TABS tablet Take 6 mg by mouth at bedtime.   Yes [provider]  metoprolol tartrate (LOPRESSOR) 25 MG tablet Take 25 mg by mouth daily.   Yes [provider]  mometasone-formoterol (DULERA) 100-5 MCG/ACT AERO Inhale 2 puffs into the lungs 2 (two) times daily.   Yes [provider]  montelukast (SINGULAIR) 10 MG tablet Take 1 tablet (10 mg total) by mouth daily. 08/01/21  Yes [name], Adewale A, MD  ondansetron (ZOFRAN) 4 MG tablet Take 4 mg by mouth every 6 (six) hours as needed for nausea or vomiting.   Yes [provider]  pantoprazole (PROTONIX) 40 MG tablet Take 40 mg by mouth in the morning and at bedtime.   Yes [provider]  Arminda Resides  500 MG CR capsule Take 1,000 mg by mouth in  the morning and at bedtime.   Yes [provider]  potassium chloride SA (KLOR-CON M) 20 MEQ tablet Take 40 mEq by mouth daily.   Yes [provider]  sodium chloride (OCEAN) 0.65 % SOLN nasal spray Place 1 spray into both nostrils in the morning, at noon, and at bedtime.   Yes [provider]  albuterol (VENTOLIN HFA) 108 (90 Base) MCG/ACT inhaler Inhale 2 puffs into the lungs every 6 (six) hours as needed for wheezing or shortness of breath. Patient not taking: Reported on 03/08/2023 07/24/22   [name], MD  lidocaine (LIDODERM) 5 % Place 1 patch onto the skin daily. Patient not taking: Reported on 03/08/2023 03/04/23   [provider]  mometasone-formoterol (DULERA) 200-5 MCG/ACT AERO Inhale 2 puffs into the lungs 2 (two) times daily. Patient not taking: Reported on 03/08/2023    [provider]   Allergies  Allergen Reactions   Motrin [Ibuprofen] Other (See Comments)    GI bleed   Ace Inhibitors Cough   Breztri Aerosphere [Budeson-Glycopyrrol-Formoterol] Hypertension   Cipro [Ciprofloxacin Hcl] Other (See Comments)    Avoid fluoroquinolones due to asc-aortic aneurysm   Levaquin [Levofloxacin] Other (See Comments)    Avoid fluoroquinolones due to asc-aortic aneurysm   Nsaids Nausea And Vomiting and Other (See Comments)    Hx of GI bleed   Review of Systems  Unable to perform ROS: Age    Physical Exam Vitals and nursing note reviewed.  Constitutional:      General: She is not in acute distress.    Appearance: Normal appearance. She is not ill-appearing.  HENT:     Head: Normocephalic and atraumatic.     Mouth/Throat:     Mouth: Mucous membranes are moist.  Cardiovascular:     Rate and Rhythm: Normal rate.  Pulmonary:     Effort: Pulmonary effort is normal. No respiratory distress.  Skin:    General: Skin is warm and dry.     Comments: Scaling and darkness to bilateral lower extremities  Neurological:     Mental Status: She is  alert and oriented to person, place, and time.  Psychiatric:        Mood and Affect: Mood normal.        Behavior: Behavior normal.     Vital Signs: BP 130/73 (BP Location: Right Arm)   Pulse 94   Temp (!) 97.5 F (36.4 C) (Tympanic)   Resp 19   Ht 5\' 6"  (1.676 m)   Wt 85.6 kg   SpO2 99%   BMI 30.47 kg/m  Pain Scale: 0-10   Pain Score: 5    SpO2: SpO2: 99 % O2 Device:SpO2: 99 % O2 Flow Rate: .O2 Flow Rate (L/min): 3 L/min  IO: Intake/output summary:  Intake/Output Summary (Last 24 hours) at 03/10/2023 0859 Last data filed at 03/10/2023 6578 Gross per 24 hour  Intake 240 ml  Output 4000 ml  Net -3760 ml    LBM: Last BM Date : 04/04/23 Baseline Weight: Weight: 90.3 kg Most recent weight: Weight: 85.6 kg     Palliative Assessment/Data:     Time In: 0800  Time Out: 0855 Time Total: 55 minutes  Greater than 50%  of this time was spent counseling and coordinating care related to the above assessment and plan.  Signed by: Katheran Awe, NP   Please contact Palliative Medicine Team phone at [phone] for questions and concerns.  For individual provider: See Loretha Stapler

## 2023-03-10 NOTE — Progress Notes (Signed)
   03/10/23 2106  BiPAP/CPAP/SIPAP  Reason BIPAP/CPAP not in use Other(comment) (pt refused our equpment stating she can't tol. She is waiting for daughter to bring home euipment)  BiPAP/CPAP /SiPAP Vitals  SpO2 96 %  Bilateral Breath Sounds Clear;Diminished

## 2023-03-10 NOTE — Progress Notes (Signed)
 [facility]                                                                              Felicia Benson, is a 89 y.o. female, DOB - 10/23/1930, ZOX:[id number] Admit date - 03/07/2023    Outpatient Primary MD for the patient is [name], MD  LOS - 3  days  Chief Complaint  Patient presents with   Weakness       Brief summary   Patient is a 89 year old female with chronic diastolic CHF, PE/DVT status post IVC filter, HTN, HLP, COPD, CAD status post PCI of RCA, chronic respiratory failure with hypoxia on 4 L O2 via [place] at home with recent admission for CAP PNA, PE/LLE DVT status post IVC filter (02/07/2023-02/19/2023) presented with shortness of breath, noted to be hypoxic at SNF with O2 sats as low as 77%.  Patient was placed on 8 L O2 via HFNC in the ED.  Patient also noted to have residual nonproductive cough, significant lower extremity swelling, received Lasix 40 mg IV x 1 in ED.  She was also hypotensive on arrival and was placed on midodrine 10 mg 3 times daily.  No acute chest pain or shortness of breath. Chest x-ray showed small layering right pleural effusion, troponin 48-45, BNP 1390.   Subjective:  Feels better overall, urinated a fair bit yesterday  Assessment & Plan   Acute on chronic respiratory failure with hypoxia (HCC) -Multifactorial with acute on chronic diastolic CHF, COPD, pulmonary hypertension, recent multifocal pneumonia, recent segmental PE -Diuretics as noted below, wean O2 as tolerated, has been on up to 4 L recently  Acute on chronic diastolic CHF, RV failure Moderate to severe pulmonary hypertension Coronary artery disease - 2D echo showed EF 50 to 55% with D-shaped IVS consistent with pressure and volume overload, severe RV dysfunction, moderate to severe pulmonary hypertension -Complicated by hypoalbuminemia (albumin 2.1) and third spacing  -6.6 L negative, weight down 12 LB, cards following transitioning to oral torsemide -GDMT  limited by hypotension, continue midodrine -Place TED hose -Increase activity, PT eval    COPD (chronic obstructive pulmonary disease) (HCC) Recent multifocal pneumonia -Known COPD with smoking history -Monitor, no wheezing at this time  History of recent pulmonary embolism, subsegmental PE, LLE DVT -During previous admission, status post IVC filter.  Was not able to tolerate anticoagulation, has history of recurrent diverticular bleeds  History of complete heart block -Status post PPM   Essential hypertension -Soft but stable, continue midodrine 10 mg 3 times daily  History of Crohn's disease, GERD -Continue Pentasa -Continue Protonix    Anemia of chronic disease -Hemoglobin 9.2, at baseline.  Hemoglobin was 8.8 on discharge on 02/14/2023, now stable/improving  Obesity class II Estimated body mass index is 30.47 kg/m as calculated from the following:   Height as of this encounter: 5\' 6"  (1.676 m).   Weight as of this encounter: 85.6 kg.  Goals of care -Multiple comorbid conditions, recurrent admissions, Dr. Isidoro Donning yesterday requested palliative medicine consult for GOC.   Code Status: DNR DVT Prophylaxis: Lovenox Family Communication: No family at bedside, discussed with patient in detail Disposition Plan:  SNF once stable, likely 1 to 2 days   Procedures:  2D echo  Consultants:   Cardiology Palliative medicine  Antimicrobials:   Anti-infectives (From admission, onward)    None          Medications  aspirin EC  81 mg Oral Daily   atorvastatin  40 mg Oral QHS   cholecalciferol  5,000 Units Oral Daily   enoxaparin (LOVENOX) injection  40 mg Subcutaneous Q24H   ferrous sulfate  325 mg Oral Daily   lidocaine  1 patch Transdermal Daily   melatonin  3 mg Oral QHS   mesalamine  1,000 mg Oral BID   midodrine  10 mg Oral TID WC   mometasone-formoterol  2 puff Inhalation BID   montelukast  10 mg Oral Daily   pantoprazole  40 mg Oral Daily   polyethylene  glycol  17 g Oral Daily   potassium chloride  20 mEq Oral BID   senna-docusate  1 tablet Oral BID   sodium chloride flush  3 mL Intravenous Q12H   torsemide  40 mg Oral Daily     Objective:   Vitals:   03/09/23 2027 03/10/23 0614 03/10/23 0623 03/10/23 0811  BP:  130/73    Pulse:  94    Resp:  19    Temp:  (!) 97.5 F (36.4 C)    TempSrc:  Tympanic    SpO2: 97% 99%  99%  Weight:   85.6 kg   Height:        Intake/Output Summary (Last 24 hours) at 03/10/2023 1132 Last data filed at 03/10/2023 0900 Gross per 24 hour  Intake 600 ml  Output 3100 ml  Net -2500 ml     Wt Readings from Last 3 Encounters:  03/10/23 85.6 kg  02/07/23 85.3 kg  12/28/22 85.3 kg     Exam Gen: Awake, Alert, Oriented X 3,  HEENT: no JVD Lungs: Good air movement bilaterally, CTAB CVS: S1S2/RRR Abd: soft, Non tender, non distended, BS present Extremities: 2+ edema Skin: no new rashes on exposed skin     Data Reviewed:  I have personally reviewed following labs    CBC Lab Results  Component Value Date   WBC 6.2 03/10/2023   RBC 3.39 (L) 03/10/2023   HGB 9.9 (L) 03/10/2023   HCT 31.7 (L) 03/10/2023   MCV 93.5 03/10/2023   MCH 29.2 03/10/2023   PLT 275 03/10/2023   MCHC 31.2 03/10/2023   RDW 19.9 (H) 03/10/2023   LYMPHSABS 0.9 03/10/2023   MONOABS 0.7 03/10/2023   EOSABS 0.1 03/10/2023   BASOSABS 0.0 03/10/2023     Last metabolic panel Lab Results  Component Value Date   NA 137 03/10/2023   K 4.0 03/10/2023   CL 95 (L) 03/10/2023   CO2 31 03/10/2023   BUN 27 (H) 03/10/2023   CREATININE 1.15 (H) 03/10/2023   GLUCOSE 95 03/10/2023   GFRNONAA 45 (L) 03/10/2023   GFRAA 44 (L) 05/21/2019   CALCIUM 8.2 (L) 03/10/2023   PHOS 3.5 02/19/2023   PROT 5.6 (L) 03/07/2023   ALBUMIN 2.1 (L) 03/07/2023   BILITOT 0.5 03/07/2023   ALKPHOS 54 03/07/2023   AST 19 03/07/2023   ALT 14 03/07/2023   ANIONGAP 11 03/10/2023    CBG (last 3)  No results for input(s): "GLUCAP" in the last  72 hours.    Coagulation Profile: No results for input(s): "INR", "PROTIME" in the last 168 hours.   Radiology Studies: I have personally reviewed  the imaging studies  No results found.      Zannie Cove M.D. [facility] 03/10/2023, 11:32 AM  Available via Epic secure chat 7am-7pm After 7 pm, please refer to night coverage provider listed on amion.

## 2023-03-11 ENCOUNTER — Ambulatory Visit: Payer: Medicare Other | Admitting: Cardiology

## 2023-03-11 ENCOUNTER — Inpatient Hospital Stay (HOSPITAL_COMMUNITY)

## 2023-03-11 DIAGNOSIS — J9621 Acute and chronic respiratory failure with hypoxia: Secondary | ICD-10-CM | POA: Diagnosis not present

## 2023-03-11 DIAGNOSIS — I5033 Acute on chronic diastolic (congestive) heart failure: Secondary | ICD-10-CM | POA: Diagnosis not present

## 2023-03-11 LAB — BASIC METABOLIC PANEL
Anion gap: 8 (ref 5–15)
BUN: 29 mg/dL — ABNORMAL HIGH (ref 8–23)
CO2: 33 mmol/L — ABNORMAL HIGH (ref 22–32)
Calcium: 8.2 mg/dL — ABNORMAL LOW (ref 8.9–10.3)
Chloride: 94 mmol/L — ABNORMAL LOW (ref 98–111)
Creatinine, Ser: 1.19 mg/dL — ABNORMAL HIGH (ref 0.44–1.00)
GFR, Estimated: 43 mL/min — ABNORMAL LOW (ref >60)
Glucose, Bld: 92 mg/dL (ref 70–99)
Potassium: 4.6 mmol/L (ref 3.5–5.1)
Sodium: 135 mmol/L (ref 135–145)

## 2023-03-11 LAB — MAGNESIUM: Magnesium: 2.2 mg/dL (ref 1.7–2.4)

## 2023-03-11 MED ORDER — OXYCODONE-ACETAMINOPHEN 5-325 MG PO TABS
1.0000 | ORAL_TABLET | Freq: Four times a day (QID) | ORAL | Status: DC | PRN
  Administered 2023-03-11 – 2023-03-17 (×10): 1 via ORAL
  Filled 2023-03-11 (×10): qty 1

## 2023-03-11 MED ORDER — ACETAMINOPHEN 325 MG PO TABS
650.0000 mg | ORAL_TABLET | Freq: Once | ORAL | Status: AC
  Administered 2023-03-11: 650 mg via ORAL

## 2023-03-11 MED ORDER — TORSEMIDE 20 MG PO TABS
20.0000 mg | ORAL_TABLET | Freq: Every day | ORAL | Status: DC
  Administered 2023-03-11 – 2023-03-12 (×2): 20 mg via ORAL
  Filled 2023-03-11 (×3): qty 1

## 2023-03-11 NOTE — [place] FL2 (Signed)
 [place] MEDICAID FL2 LEVEL OF CARE FORM     IDENTIFICATION  Patient Name: Felicia Benson Birthdate: 1930/11/01 Sex: female Admission Date (Current Location): 03/07/2023  [facility] and [place] Number:  Producer, television/film/video and Address:  [facility], [address] [place], [place] [address]      Provider Number: [id number]  Attending Physician Name and Address:  Zannie Cove, MD  Relative Name and Phone Number:  Alvis Lemmings (daughter) [phone]    Current Level of Care: Hospital Recommended Level of Care: Skilled Nursing Facility Prior Approval Number:    Date Approved/Denied:   PASRR Number: [id number]  Discharge Plan: SNF    Current Diagnoses: Patient Active Problem List   Diagnosis Date Noted   Acute on chronic respiratory failure with hypoxia (HCC) 03/08/2023   Elevated troponin 03/08/2023   Acute exacerbation of CHF (congestive heart failure) (HCC) 03/07/2023   Right ventricular failure (HCC) 02/15/2023   Shortness of breath 02/13/2023   DVT (deep venous thrombosis) (HCC) 02/11/2023   Acute pulmonary embolism (HCC) 02/08/2023   Acute respiratory failure with hypoxia (HCC) 02/08/2023   CAP (community acquired pneumonia) 02/07/2023   Filling defect on imaging study 02/07/2023   CKD (chronic kidney disease) 02/07/2023   Pneumonia 02/07/2023   Diverticular hemorrhage 12/28/2022   Ileus, postoperative (HCC) 11/04/2022   Closed right hip fracture (HCC) 10/28/2022   Fall at home, initial encounter 10/28/2022   History of COPD 10/28/2022   Allergic rhinitis 10/28/2022   Anemia of chronic disease 10/28/2022   Chronic rhinitis 08/06/2022   OSA (obstructive sleep apnea) 08/06/2022   Heart failure, acute diastolic (HCC) 07/21/2022   CKD stage 3a, GFR 45-59 ml/min (HCC) 07/21/2022   Thoracic aortic aneurysm (HCC) 07/21/2022   GERD (gastroesophageal reflux disease) 07/21/2022   Pancreatic lesion 07/21/2022   Macular degeneration 07/21/2022    Hypocalcemia 07/21/2022   Insomnia 07/21/2022   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   TIA (transient ischemic attack) 12/31/2020   Acute on chronic diastolic heart failure (HCC) 05/19/2019   Acute CHF (congestive heart failure) (HCC) 05/18/2019   Chronic hypoxic respiratory failure (HCC) 05/18/2019   COPD (chronic obstructive pulmonary disease) (HCC) 05/18/2019   History of cardiac pacemaker 05/18/2019   AAA (abdominal aortic aneurysm) 05/18/2019   Hyperlipidemia 05/18/2019   Abdominal pain in female 05/30/2015   Essential hypertension 05/30/2015   Crohn's disease (HCC) 05/30/2015   Gastrointestinal hemorrhage with melena 05/30/2015   Blood loss anemia 05/30/2015   Hyponatremia 05/30/2015   Kidney disease 05/30/2015   Normocytic anemia 05/30/2015   CAD S/P percutaneous coronary angioplasty 05/30/2015   Acute blood loss anemia 05/30/2015   Heme + stool     Orientation RESPIRATION BLADDER Height & Weight     Self, Time, Situation, Place  O2 (Nasal Cannula 3 liters) Incontinent, External catheter (External Urinary Catheter) Weight: 188 lb 12.8 oz (85.6 kg) Height:  5\' 6"  (167.6 cm)  BEHAVIORAL SYMPTOMS/MOOD NEUROLOGICAL BOWEL NUTRITION STATUS      Incontinent Diet (Please see discharge summary)  AMBULATORY STATUS COMMUNICATION OF NEEDS Skin   Limited Assist Verbally Other (Comment) (Abrasion,Arm,L,Gauze,Ecchymosis,Arm,Leg,Bil.,Wound/Incision LDAs,PI Sacrum mid stage 3,Wound/Incision open or dehisced skin tear,arm,anterior,L,upper)                       Personal Care Assistance Level of Assistance    Bathing Assistance: Limited assistance Feeding assistance: Independent Dressing Assistance: Limited assistance     Functional Limitations Info  Sight, Hearing, Speech Sight Info:  Impaired Hearing Info: Impaired Speech Info: Adequate    SPECIAL CARE FACTORS FREQUENCY  PT (By licensed PT), OT (By licensed OT)     PT Frequency: 5x min weekly OT Frequency: 5x min  weekly            Contractures Contractures Info: Not present    Additional Factors Info  Code Status, Psychotropic Code Status Info: DNR Allergies Info: Motrin (ibuprofen),Ace Inhibitors,Breztri Aerosphere (budeson-glycopyrrol-formoterol),Cipro (ciprofloxacin Hcl),Levaquin (levofloxacin),Nsaids Psychotropic Info: melatonin tablet 3 mg daily at bedtime         Current Medications (03/11/2023):  This is the current hospital active medication list Current Facility-Administered Medications  Medication Dose Route Frequency Provider Last Rate Last Admin   acetaminophen (TYLENOL) tablet 650 mg  650 mg Oral Q4H PRN Swayze, Ava, DO   650 mg at 03/11/23 0710   albuterol (PROVENTIL) (2.5 MG/3ML) 0.083% nebulizer solution 3 mL  3 mL Inhalation Q6H PRN Swayze, Ava, DO       aspirin EC tablet 81 mg  81 mg Oral Daily Swayze, Ava, DO   81 mg at 03/11/23 0852   atorvastatin (LIPITOR) tablet 40 mg  40 mg Oral QHS Swayze, Ava, DO   40 mg at 03/10/23 2138   cholecalciferol (VITAMIN D3) 25 MCG (1000 UNIT) tablet 5,000 Units  5,000 Units Oral Daily Swayze, Ava, DO   5,000 Units at 03/11/23 0852   enoxaparin (LOVENOX) injection 40 mg  40 mg Subcutaneous Q24H Swayze, Ava, DO   40 mg at 03/10/23 2139   ferrous sulfate tablet 325 mg  325 mg Oral Daily Swayze, Ava, DO   325 mg at 03/11/23 1019   lidocaine (LIDODERM) 5 % 1 patch  1 patch Transdermal Daily Swayze, Ava, DO   1 patch at 03/10/23 0904   melatonin tablet 3 mg  3 mg Oral QHS Zannie Cove, MD   3 mg at 03/10/23 2138   mesalamine (PENTASA) CR capsule 1,000 mg  1,000 mg Oral BID Swayze, Ava, DO   1,000 mg at 03/11/23 1020   midodrine (PROAMATINE) tablet 10 mg  10 mg Oral TID WC Dolly Rias, MD   10 mg at 03/11/23 1230   mometasone-formoterol (DULERA) 200-5 MCG/ACT inhaler 2 puff  2 puff Inhalation BID Swayze, Ava, DO   2 puff at 03/11/23 0904   montelukast (SINGULAIR) tablet 10 mg  10 mg Oral Daily Swayze, Ava, DO   10 mg at 03/11/23 0852    ondansetron (ZOFRAN) injection 4 mg  4 mg Intravenous Q6H PRN Swayze, Ava, DO       pantoprazole (PROTONIX) EC tablet 40 mg  40 mg Oral Daily Swayze, Ava, DO   40 mg at 03/11/23 0852   polyethylene glycol (MIRALAX / GLYCOLAX) packet 17 g  17 g Oral Daily Rai, Ripudeep K, MD   17 g at 03/11/23 0852   senna-docusate (Senokot-S) tablet 1 tablet  1 tablet Oral BID Zannie Cove, MD   1 tablet at 03/11/23 8295   sodium chloride flush (NS) 0.9 % injection 3 mL  3 mL Intravenous Q12H Swayze, Ava, DO   3 mL at 03/11/23 0858   sodium chloride flush (NS) 0.9 % injection 3 mL  3 mL Intravenous PRN Swayze, Ava, DO       torsemide (DEMADEX) tablet 20 mg  20 mg Oral Daily Little Ishikawa, MD   20 mg at 03/11/23 0908     Discharge Medications: Please see discharge summary for a list of discharge medications.  Relevant  Imaging Results:  Relevant Lab Results:   Additional Information [id number]  Delilah Shan, LCSWA

## 2023-03-11 NOTE — Progress Notes (Deleted)
  Electrophysiology Office Note:    Date:  03/11/2023   ID:  Felicia Benson, DOB [birth year], MRN [id number]  [facility] Cardiologist:  None  [facility] Electrophysiologist:  Lanier Prude, MD   Referring MD: [name], MD   Chief Complaint: ***  History of Present Illness:          Their past medical, social and family history was reviewed.   ROS:   Please see the history of present illness.    All other systems reviewed and are negative.  EKGs/Labs/Other Studies Reviewed:    The following studies were reviewed today:         Physical Exam:    VS:  There were no vitals taken for this visit.    Wt Readings from Last 3 Encounters:  03/10/23 188 lb 12.8 oz (85.6 kg)  02/07/23 188 lb 0.8 oz (85.3 kg)  12/28/22 188 lb (85.3 kg)     GEN: no distress CARD: RRR, No MRG RESP: No IWOB. CTAB.        ASSESSMENT AND PLAN:    No diagnosis found.        Signed, Rossie Muskrat. Lalla Brothers, MD, [facility], [facility] 03/11/2023 5:56 AM    Electrophysiology [facility]

## 2023-03-11 NOTE — TOC Initial Note (Signed)
 Transition of Care [facility]) - Initial/Assessment Note    Patient Details  Name: Felicia Benson MRN: [id number] Date of Birth: [birth year]  Transition of [facility]) CM/SW Contact:    Delilah Shan, LCSWA Phone Number: 03/11/2023, 2:44 PM  Clinical Narrative:                  CSW received consult for possible SNF placement at time of discharge. CSW spoke with patient at bedside regarding PT recommendation of SNF placement at time of discharge. Patient reports PTA she comes from [place] short term and was getting ready to turn into long term at facility. Patient expressed understanding of PT recommendation and is agreeable to short term rehab at [facility] when medically ready for dc.CSW discussed insurance authorization process .  No further questions reported at this time. CSW to continue to follow and assist with discharge planning needs.    Expected Discharge Plan: Skilled Nursing Facility Barriers to Discharge: Continued Medical Work up   Patient Goals and CMS Choice Patient states their goals for this hospitalization and ongoing recovery are:: SNF   Choice offered to / list presented to : Patient      Expected Discharge Plan and Services In-house Referral: Clinical Social Work     Living arrangements for the past 2 months: Skilled Nursing Facility                                      Prior Living Arrangements/Services Living arrangements for the past 2 months: Skilled Nursing Facility Lives with:: Facility Resident Patient language and need for interpreter reviewed:: Yes Do you feel safe going back to the place where you live?: No   SNF  Need for Family Participation in Patient Care: Yes (Comment) Care giver support system in place?: Yes (comment)   Criminal Activity/Legal Involvement Pertinent to Current Situation/Hospitalization: No - Comment as needed  Activities of Daily Living   ADL Screening (condition at time of admission) Independently performs  ADLs?: No Does the patient have a NEW difficulty with bathing/dressing/toileting/self-feeding that is expected to last >3 days?: Yes (Initiates electronic notice to provider for possible OT consult) Does the patient have a NEW difficulty with getting in/out of bed, walking, or climbing stairs that is expected to last >3 days?: Yes (Initiates electronic notice to provider for possible PT consult) Does the patient have a NEW difficulty with communication that is expected to last >3 days?: No Is the patient deaf or have difficulty hearing?: Yes Does the patient have difficulty seeing, even when wearing glasses/contacts?: Yes Does the patient have difficulty concentrating, remembering, or making decisions?: No  Permission Sought/Granted Permission sought to share information with : Case Manager, Family Supports, Oceanographer granted to share information with : Yes, Verbal Permission Granted  Share Information with NAME: [name]  Permission granted to share info w AGENCY: SNF  Permission granted to share info w Relationship: daughter  Permission granted to share info w Contact Information: Alvis Lemmings [phone]  Emotional Assessment Appearance:: Appears stated age Attitude/Demeanor/Rapport: Gracious Affect (typically observed): Calm Orientation: : Oriented to Self, Oriented to Place, Oriented to  Time, Oriented to Situation Alcohol / Substance Use: Not Applicable Psych Involvement: No (comment)  Admission diagnosis:  Acute exacerbation of CHF (congestive heart failure) (HCC) [I50.9] Acute on chronic respiratory failure with hypoxia (HCC) [J96.21] Acute on chronic congestive heart failure, unspecified heart failure type (HCC) [I50.9] Patient Active  Problem List   Diagnosis Date Noted   Acute on chronic respiratory failure with hypoxia (HCC) 03/08/2023   Elevated troponin 03/08/2023   Acute exacerbation of CHF (congestive heart failure) (HCC) 03/07/2023   Right  ventricular failure (HCC) 02/15/2023   Shortness of breath 02/13/2023   DVT (deep venous thrombosis) (HCC) 02/11/2023   Acute pulmonary embolism (HCC) 02/08/2023   Acute respiratory failure with hypoxia (HCC) 02/08/2023   CAP (community acquired pneumonia) 02/07/2023   Filling defect on imaging study 02/07/2023   CKD (chronic kidney disease) 02/07/2023   Pneumonia 02/07/2023   Diverticular hemorrhage 12/28/2022   Ileus, postoperative (HCC) 11/04/2022   Closed right hip fracture (HCC) 10/28/2022   Fall at home, initial encounter 10/28/2022   History of COPD 10/28/2022   Allergic rhinitis 10/28/2022   Anemia of chronic disease 10/28/2022   Chronic rhinitis 08/06/2022   OSA (obstructive sleep apnea) 08/06/2022   Heart failure, acute diastolic (HCC) 07/21/2022   CKD stage 3a, GFR 45-59 ml/min (HCC) 07/21/2022   Thoracic aortic aneurysm (HCC) 07/21/2022   GERD (gastroesophageal reflux disease) 07/21/2022   Pancreatic lesion 07/21/2022   Macular degeneration 07/21/2022   Hypocalcemia 07/21/2022   Insomnia 07/21/2022   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   TIA (transient ischemic attack) 12/31/2020   Acute on chronic diastolic heart failure (HCC) 05/19/2019   Acute CHF (congestive heart failure) (HCC) 05/18/2019   Chronic hypoxic respiratory failure (HCC) 05/18/2019   COPD (chronic obstructive pulmonary disease) (HCC) 05/18/2019   History of cardiac pacemaker 05/18/2019   AAA (abdominal aortic aneurysm) 05/18/2019   Hyperlipidemia 05/18/2019   Abdominal pain in female 05/30/2015   Essential hypertension 05/30/2015   Crohn's disease (HCC) 05/30/2015   Gastrointestinal hemorrhage with melena 05/30/2015   Blood loss anemia 05/30/2015   Hyponatremia 05/30/2015   Kidney disease 05/30/2015   Normocytic anemia 05/30/2015   CAD S/P percutaneous coronary angioplasty 05/30/2015   Acute blood loss anemia 05/30/2015   Heme + stool    PCP:  [name], MD Pharmacy:   [facility] - [place], [place] - [address] [place] [address] Phone: [phone] Fax: [phone]  [facility] [address] [place] - [address] [place] [address] Phone: [phone] Fax: [phone]     Social Drivers of Health (SDOH) Social History: SDOH Screenings   Food Insecurity: No Food Insecurity (03/07/2023)  Housing: Low Risk  (03/07/2023)  Recent Concern: Housing - High Risk (12/28/2022)  Transportation Needs: No Transportation Needs (03/07/2023)  Utilities: Not At Risk (03/07/2023)  Social Connections: Moderately Isolated (03/07/2023)  Tobacco Use: Medium Risk (03/10/2023)   SDOH Interventions:     Readmission Risk Interventions    07/24/2022    2:49 PM  Readmission Risk Prevention Plan  Transportation Screening Complete  PCP or Specialist Appt within 5-7 Days Complete  Home Care Screening Complete  Medication Review (RN CM) Complete

## 2023-03-11 NOTE — Progress Notes (Signed)
   03/11/23 0727  What Happened  Was fall witnessed? Yes  Who witnessed fall? Darius NT  Patients activity before fall ambulating-assisted;other (comment) (Standing to be weighed)  Point of contact buttocks  Was patient injured? No  Provider Notification  Provider Name/Title Jomarie Longs  Date Provider Notified 03/11/23  Time Provider Notified [phone]  Method of Notification Page  Notification Reason Fall  Follow Up  Family notified No - patient refusal  Adult Fall Risk Assessment  Risk Factor Category (scoring not indicated) History of more than one fall within 6 months before admission (document High fall risk)  Adult Fall Risk Interventions  Additional Interventions Use of appropriate toileting equipment (bedpan, BSC, etc.)  Screening for Fall Injury Risk (To be completed on HIGH fall risk patients) - Assessing Need for Floor Mats  Risk For Fall Injury- Criteria for Floor Mats 85 years or older  Will Implement Floor Mats Yes    Patient was standing to be weighed, while standing to get on the scale patient states her right knee gave out on her and when she tried to sit back down on the side of the bed, she was not able to get her butt fully on the bed because she was not able to bend her right knee so she was assisted to the floor, patient was lifted from the floor with lift.  Patient states that her right knee has a sharp radiating pain to her right foot, was given tylenol.  RN notified provider on call

## 2023-03-11 NOTE — Progress Notes (Signed)
 Heart Failure Navigator Progress Note  Assessed for Heart & Vascular TOC clinic readiness.  Patient does not meet criteria due to will follow with General Cardiology, No HF TOC per Dr. Jomarie Longs. .   Navigator will sign off at this time.   Rhae Hammock, BSN, Scientist, clinical (histocompatibility and immunogenetics) Only

## 2023-03-11 NOTE — Progress Notes (Signed)
 [facility]                                                                              Felicia Benson, is a 89 y.o. female, DOB - [birth year], JYN:[id number] Admit date - 03/07/2023    Outpatient Primary MD for the patient is [name], MD  LOS - 4  days  Chief Complaint  Patient presents with   Weakness       Brief summary   Patient is a 89 year old female with chronic diastolic CHF, PE/DVT status post IVC filter, HTN, HLP, COPD, CAD status post PCI of RCA, chronic respiratory failure with hypoxia on 4 L O2 via [place] at home with recent admission for CAP PNA, PE/LLE DVT status post IVC filter (02/07/2023-02/19/2023) presented with shortness of breath, noted to be hypoxic at SNF with O2 sats as low as 77%.  Patient was placed on 8 L O2 via [facility] in the ED.  Patient also noted to have residual nonproductive cough, significant lower extremity swelling, received Lasix 40 mg IV x 1 in ED.  She was also hypotensive on arrival and was placed on midodrine 10 mg 3 times daily.  No acute chest pain or shortness of breath. Chest x-ray showed small layering right pleural effusion, troponin 48-45, BNP 1390.   Subjective:  -Fell this morning, right ankle and knee hurt  Assessment & Plan   Acute on chronic respiratory failure with hypoxia (HCC) -Multifactorial with acute on chronic diastolic CHF, COPD, pulmonary hypertension, recent multifocal pneumonia, recent segmental PE -Diuretics as noted below, wean O2 as tolerated, has been on up to 4 L recently  Acute on chronic diastolic CHF, RV failure Moderate to severe pulmonary hypertension Coronary artery disease - 2D echo showed EF 50 to 55% with D-shaped IVS consistent with pressure and volume overload, severe RV dysfunction, moderate to severe pulmonary hypertension -Complicated by hypoalbuminemia (albumin 2.1) and third spacing  -7.4L neg, wt down 12lb, cards following transitioned to oral torsemide -GDMT limited by hypotension,  continue midodrine -Place TED hose -Increase activity, PT eval  Fall, angle and knee pain -check Xrays, PT eval    COPD (chronic obstructive pulmonary disease) (HCC) Recent multifocal pneumonia -Known COPD with smoking history -Monitor, no wheezing at this time  History of recent pulmonary embolism, subsegmental PE, LLE DVT -During previous admission, status post IVC filter.  Was not able to tolerate anticoagulation, has history of recurrent diverticular bleeds  History of complete heart block -Status post PPM   Essential hypertension -Soft but stable, continue midodrine 10 mg 3 times daily  History of Crohn's disease, GERD -Continue Pentasa -Continue Protonix    Anemia of chronic disease -Hemoglobin 9.2, at baseline.  Hemoglobin was 8.8 on discharge on 02/14/2023, now stable/improving  Obesity class II Estimated body mass index is 30.47 kg/m as calculated from the following:   Height as of this encounter: 5\' 6"  (1.676 m).   Weight as of this encounter: 85.6 kg.  Goals of care -Multiple comorbid conditions, recurrent admissions, Dr. Isidoro Donning yesterday requested palliative medicine consult for GOC.   Code Status: DNR DVT Prophylaxis: Lovenox Family Communication: No family at  bedside, discussed with patient in detail Disposition Plan:   SNF once stable, likely tomorrow   Procedures:  2D echo  Consultants:   Cardiology Palliative medicine  Antimicrobials:   Anti-infectives (From admission, onward)    None          Medications  aspirin EC  81 mg Oral Daily   atorvastatin  40 mg Oral QHS   cholecalciferol  5,000 Units Oral Daily   enoxaparin (LOVENOX) injection  40 mg Subcutaneous Q24H   ferrous sulfate  325 mg Oral Daily   lidocaine  1 patch Transdermal Daily   melatonin  3 mg Oral QHS   mesalamine  1,000 mg Oral BID   midodrine  10 mg Oral TID WC   mometasone-formoterol  2 puff Inhalation BID   montelukast  10 mg Oral Daily   pantoprazole  40 mg Oral  Daily   polyethylene glycol  17 g Oral Daily   senna-docusate  1 tablet Oral BID   sodium chloride flush  3 mL Intravenous Q12H   torsemide  20 mg Oral Daily     Objective:   Vitals:   03/11/23 0310 03/11/23 0707 03/11/23 0905 03/11/23 1255  BP: 121/75 123/74  125/73  Pulse: 77 (!) 108 92 86  Resp: 16 20 17    Temp: (!) 97.4 F (36.3 C) (!) 97.4 F (36.3 C)  97.8 F (36.6 C)  TempSrc: Oral Oral  Oral  SpO2: 96%  93%   Weight:      Height:        Intake/Output Summary (Last 24 hours) at 03/11/2023 1335 Last data filed at 03/11/2023 0300 Gross per 24 hour  Intake 50 ml  Output 975 ml  Net -925 ml     Wt Readings from Last 3 Encounters:  03/10/23 85.6 kg  02/07/23 85.3 kg  12/28/22 85.3 kg     Exam Gen: Awake, Alert, Oriented X 3,  HEENT: + JVD Lungs: Good air movement bilaterally, CTAB CVS: S1S2/RRR Abd: soft, Non tender, non distended, BS present Extremities: 1+ edema Skin: no new rashes on exposed skin     Data Reviewed:  I have personally reviewed following labs    CBC Lab Results  Component Value Date   WBC 6.2 03/10/2023   RBC 3.39 (L) 03/10/2023   HGB 9.9 (L) 03/10/2023   HCT 31.7 (L) 03/10/2023   MCV 93.5 03/10/2023   MCH 29.2 03/10/2023   PLT 275 03/10/2023   MCHC 31.2 03/10/2023   RDW 19.9 (H) 03/10/2023   LYMPHSABS 0.9 03/10/2023   MONOABS 0.7 03/10/2023   EOSABS 0.1 03/10/2023   BASOSABS 0.0 03/10/2023     Last metabolic panel Lab Results  Component Value Date   NA 135 03/11/2023   K 4.6 03/11/2023   CL 94 (L) 03/11/2023   CO2 33 (H) 03/11/2023   BUN 29 (H) 03/11/2023   CREATININE 1.19 (H) 03/11/2023   GLUCOSE 92 03/11/2023   GFRNONAA 43 (L) 03/11/2023   GFRAA 44 (L) 05/21/2019   CALCIUM 8.2 (L) 03/11/2023   PHOS 3.5 02/19/2023   PROT 5.6 (L) 03/07/2023   ALBUMIN 2.1 (L) 03/07/2023   BILITOT 0.5 03/07/2023   ALKPHOS 54 03/07/2023   AST 19 03/07/2023   ALT 14 03/07/2023   ANIONGAP 8 03/11/2023    CBG (last 3)  No  results for input(s): "GLUCAP" in the last 72 hours.    Coagulation Profile: No results for input(s): "INR", "PROTIME" in the last 168 hours.  Radiology Studies: I have personally reviewed the imaging studies  DG Knee 1-2 Views Right Result Date: 03/11/2023 CLINICAL DATA:  Pain after fall EXAM: RIGHT KNEE - 2 VIEW COMPARISON:  None Available. FINDINGS: Osteopenia. Slight joint space loss of the patellofemoral joint and medial compartment. Small osteophytes. No fracture or dislocation. Trace joint fluid. Calcifications seen along the patellar tendon. Prominent vascular calcifications posterior to the knee. IMPRESSION: Osteopenia with mild degenerative changes. Trace joint fluid. Please correlate with clinical findings. Electronically Signed   By: Karen Kays M.D.   On: 03/11/2023 12:59        Zannie Cove M.D. [facility] 03/11/2023, 1:35 PM  Available via Epic secure chat 7am-7pm After 7 pm, please refer to night coverage provider listed on amion.

## 2023-03-11 NOTE — Consult Note (Signed)
 [facility] Liaison Consult Note    03/11/2023  Felicia Benson [name] November 04, 1930 [id number]  [facility] Consult   Primary Care Provider:  [name], MD with Deboraha Sprang at [facility]: Medicare ACO REACH  Patient was reviewed for less than 30 days readmission with extreme high risk score barriers to care when returning to community and patient as per notes being recommended for a skilled nursing level of care for post hospital transition.  Patient was screened for hospitalization and on behalf of [facility]  Care Coordination to assess for post hospital community care needs.  Patient is being considered for a skilled nursing facility level of care for post hospital transition.  Plan:  If transitions to affiliated facility, the [facility] [facility] RN can be notified and follow for any known needs for transitional care needs for returning to post facility care coordination needs for returning to community. Continue to follow.  For questions or referrals, please contact:  Charlesetta Shanks, RN, BSN, CCM [place]  [facility], [facility] Liaison Direct Dial: [phone] or secure chat Email: [email or web address]

## 2023-03-11 NOTE — Progress Notes (Addendum)
 Rounding Note    Patient Name: Felicia Benson Date of Encounter: 03/11/2023  [facility] Cardiologist: Chilton Si, MD  EP: Steffanie Dunn  Subjective   Breathing at baseline, fell this morning. Pain in the right knee and R ankle, waiting on x ray. (Tele at the same time showed jump in HR around 6:45AM, HR has since settled back down)   Inpatient Medications    Scheduled Meds:  aspirin EC  81 mg Oral Daily   atorvastatin  40 mg Oral QHS   cholecalciferol  5,000 Units Oral Daily   enoxaparin (LOVENOX) injection  40 mg Subcutaneous Q24H   ferrous sulfate  325 mg Oral Daily   lidocaine  1 patch Transdermal Daily   melatonin  3 mg Oral QHS   mesalamine  1,000 mg Oral BID   midodrine  10 mg Oral TID WC   mometasone-formoterol  2 puff Inhalation BID   montelukast  10 mg Oral Daily   pantoprazole  40 mg Oral Daily   polyethylene glycol  17 g Oral Daily   senna-docusate  1 tablet Oral BID   sodium chloride flush  3 mL Intravenous Q12H   torsemide  20 mg Oral Daily   Continuous Infusions:  PRN Meds: acetaminophen, albuterol, ondansetron (ZOFRAN) IV, sodium chloride flush   Vital Signs    Vitals:   03/10/23 1927 03/10/23 2106 03/11/23 0310 03/11/23 0707  BP: (!) 143/76  121/75 123/74  Pulse:   77 (!) 108  Resp: 20  16 20   Temp: 97.9 F (36.6 C)  (!) 97.4 F (36.3 C) (!) 97.4 F (36.3 C)  TempSrc: Oral  Oral Oral  SpO2:  96% 96%   Weight:      Height:        Intake/Output Summary (Last 24 hours) at 03/11/2023 0832 Last data filed at 03/11/2023 0300 Gross per 24 hour  Intake 650 ml  Output 1475 ml  Net -825 ml      03/10/2023    6:23 AM 03/09/2023    5:00 AM 03/08/2023   10:19 AM  Last 3 Weights  Weight (lbs) 188 lb 12.8 oz 191 lb 12.8 oz 195 lb  Weight (kg) 85.639 kg 87 kg 88.451 kg      Telemetry    V-paced rhythm, HR jumped around 6:45AM this morning, correlating with her fall, HR has since gradually came back down. - Personally  Reviewed  ECG    V-paced rhythm - Personally Reviewed  Physical Exam   GEN: No acute distress.   Neck: No JVD Cardiac: RRR, no murmurs, rubs, or gallops.  Respiratory: Clear to auscultation bilaterally. GI: Soft, nontender, non-distended  MS: trace non-pitting edema; No deformity. Neuro:  Nonfocal  Psych: Normal affect   Labs    High Sensitivity Troponin:   Recent Labs  Lab 03/07/23 1213 03/08/23 0815  TROPONINIHS 48* 45*     Chemistry Recent Labs  Lab 03/07/23 1213 03/08/23 0439 03/09/23 0420 03/10/23 0342 03/11/23 0457  NA 136   < > 136 137 135  K 4.3   < > 4.3 4.0 4.6  CL 97*   < > 97* 95* 94*  CO2 32   < > 31 31 33*  GLUCOSE 106*   < > 87 95 92  BUN 27*   < > 26* 27* 29*  CREATININE 1.29*   < > 1.03* 1.15* 1.19*  CALCIUM 7.8*   < > 7.9* 8.2* 8.2*  MG  --   --   --  1.8 2.2  PROT 5.6*  --   --   --   --   ALBUMIN 2.1*  --   --   --   --   AST 19  --   --   --   --   ALT 14  --   --   --   --   ALKPHOS 54  --   --   --   --   BILITOT 0.5  --   --   --   --   GFRNONAA 39*   < > 52* 45* 43*  ANIONGAP 7   < > 8 11 8    < > = values in this interval not displayed.    Lipids No results for input(s): "CHOL", "TRIG", "HDL", "LABVLDL", "LDLCALC", "CHOLHDL" in the last 168 hours.  Hematology Recent Labs  Lab 03/08/23 0439 03/09/23 0420 03/10/23 0342  WBC 5.6 6.3 6.2  RBC 3.17* 3.40* 3.39*  HGB 9.2* 9.9* 9.9*  HCT 30.1* 31.8* 31.7*  MCV 95.0 93.5 93.5  MCH 29.0 29.1 29.2  MCHC 30.6 31.1 31.2  RDW 20.1* 20.0* 19.9*  PLT 243 266 275   Thyroid No results for input(s): "TSH", "FREET4" in the last 168 hours.  BNP Recent Labs  Lab 03/07/23 1213  BNP 1,390.1*    DDimer No results for input(s): "DDIMER" in the last 168 hours.   Radiology    No results found.  Cardiac Studies   Echo 03/08/2023 1. Left ventricular ejection fraction, by estimation, is 50 to 55%. The  left ventricle has low normal function. Left ventricular endocardial  border not  optimally defined to evaluate regional wall motion. Left  ventricular diastolic parameters are  indeterminate.   2. D-shaped septum suggestive of RV pressure/volume overload. Right  ventricular systolic function is severely reduced. The right ventricular  size is moderately enlarged. There is moderately elevated pulmonary artery  systolic pressure. The estimated  right ventricular systolic pressure is 59.6 mmHg.   3. Right atrial size was mildly dilated.   4. The mitral valve is normal in structure. No evidence of mitral valve  regurgitation. No evidence of mitral stenosis.   5. The aortic valve is tricuspid. There is mild calcification of the  aortic valve. Aortic valve regurgitation is trivial. No aortic stenosis is  present.   6. Tricuspid valve regurgitation is moderate.   7. Aortic dilatation noted. There is mild dilatation of the ascending  aorta, measuring 43 mm.   8. The inferior vena cava is normal in size with greater than 50%  respiratory variability, suggesting right atrial pressure of 3 mmHg.   9. A small pericardial effusion is present. The pericardial effusion is  posterior and lateral to the left ventricle.   Patient Profile     89 y.o. female with PMH of HFpEF, CHB s/p PPM 07/2022, pulm hypertension, CAD s/p PCI of RCA and repeat PCI 2017 for ISR, acute PE/DVT s/p IVC filture, AAA, HTN, CKD, Crohn's disease with recurrent diverticular bleed 2020, recent admission for CAP with chronic resp failure on 4L supplemental O2 who presented with CHF  Assessment & Plan    Acute on chronic diastolic heart failure / RV failure  - Echo 03/08/2023 EF 50-55%, D shaped septum suggestive of RV pressure/volume overload, RV systolic function severely reduced, moderate TR, RVSP 59.6 mmHg, dilated ascending aorta measuring 43 mm, small pericardial effusion  - hypoalbuminemia also contributing to volume overload  - transitioned to PO torsemide yesterday.  midodrine  10mg  TID added during this  admission. Home beta blocker off due to soft BP  - euvolemic this morning, stable from cardiac perspective. No further recommendation. Fell this morning, telemetry showed jump in HR at the same time, HR has since came back down. Awaiting x ray by primary team.   Acute on chronic hypoxic respiratory failure: multifactorial with diastolic CHF, COPD, recent PE  CHB s/p PPM  CAD: borderline flat trop likely demand ischemia related to CHF exacerbation  Recent PE/DVT: diagnosed last month, s/p IV filter given history of GI bleed  Fall: occurred in the morning of 3/3, complained of R knee and R ankle pain      For questions or updates, please contact [facility] Please consult [email or web address] for contact info under        Signed, Azalee Course, PA  03/11/2023, 8:32 AM     Patient seen and examined.  Agree with above documentation.  On exam, patient is alert and oriented, regular rate and rhythm, no murmurs, lungs CTAB, trace LE edema, no JVD.  Net -825 cc yesterday, -7.5 L on admission.  Creatinine stable at 1.19.  Transitioned to p.o. torsemide 20 mg daily.  Unfortunately had mechanical fall this morning, having pain in knee and ankle.  X-rays pending  Little Ishikawa, MD

## 2023-03-12 DIAGNOSIS — I5033 Acute on chronic diastolic (congestive) heart failure: Secondary | ICD-10-CM | POA: Diagnosis not present

## 2023-03-12 DIAGNOSIS — J9621 Acute and chronic respiratory failure with hypoxia: Secondary | ICD-10-CM | POA: Diagnosis not present

## 2023-03-12 LAB — BASIC METABOLIC PANEL
Anion gap: 11 (ref 5–15)
BUN: 30 mg/dL — ABNORMAL HIGH (ref 8–23)
CO2: 28 mmol/L (ref 22–32)
Calcium: 8.2 mg/dL — ABNORMAL LOW (ref 8.9–10.3)
Chloride: 96 mmol/L — ABNORMAL LOW (ref 98–111)
Creatinine, Ser: 1.32 mg/dL — ABNORMAL HIGH (ref 0.44–1.00)
GFR, Estimated: 38 mL/min — ABNORMAL LOW (ref >60)
Glucose, Bld: 93 mg/dL (ref 70–99)
Potassium: 4.6 mmol/L (ref 3.5–5.1)
Sodium: 135 mmol/L (ref 135–145)

## 2023-03-12 MED ORDER — POLYETHYLENE GLYCOL 3350 17 G PO PACK
17.0000 g | PACK | Freq: Every day | ORAL | Status: DC | PRN
  Administered 2023-03-12 – 2023-03-14 (×3): 17 g via ORAL
  Filled 2023-03-12 (×3): qty 1

## 2023-03-12 NOTE — Progress Notes (Signed)
 Rounding Note    Patient Name: Felicia Benson Date of Encounter: 03/12/2023  [facility] Cardiologist: Chilton Si, MD  EP: Steffanie Dunn  Subjective   BP down to 81/61 this morning.  She denies any lightheadedness.  Denies any chest pain or dyspnea.  Creatinine mildly trending up (1.03 > 1.15 > 1.19 > 1.32)  Inpatient Medications    Scheduled Meds:  aspirin EC  81 mg Oral Daily   atorvastatin  40 mg Oral QHS   cholecalciferol  5,000 Units Oral Daily   enoxaparin (LOVENOX) injection  40 mg Subcutaneous Q24H   ferrous sulfate  325 mg Oral Daily   lidocaine  1 patch Transdermal Daily   melatonin  3 mg Oral QHS   mesalamine  1,000 mg Oral BID   midodrine  10 mg Oral TID WC   mometasone-formoterol  2 puff Inhalation BID   montelukast  10 mg Oral Daily   pantoprazole  40 mg Oral Daily   polyethylene glycol  17 g Oral Daily   senna-docusate  1 tablet Oral BID   sodium chloride flush  3 mL Intravenous Q12H   torsemide  20 mg Oral Daily   Continuous Infusions:  PRN Meds: acetaminophen, albuterol, ondansetron (ZOFRAN) IV, oxyCODONE-acetaminophen, sodium chloride flush   Vital Signs    Vitals:   03/11/23 2050 03/12/23 0319 03/12/23 0758 03/12/23 0930  BP:  96/64 (!) 81/61   Pulse: 99 100 (!) 112   Resp: 19 16 18 15   Temp:  (!) 97.3 F (36.3 C) (!) 97 F (36.1 C)   TempSrc:  Oral Oral   SpO2: 95% 90% 95%   Weight:      Height:        Intake/Output Summary (Last 24 hours) at 03/12/2023 1105 Last data filed at 03/12/2023 1000 Gross per 24 hour  Intake 350 ml  Output 900 ml  Net -550 ml      03/12/2023    5:00 AM 03/10/2023    6:23 AM 03/09/2023    5:00 AM  Last 3 Weights  Weight (lbs) -- 188 lb 12.8 oz 191 lb 12.8 oz  Weight (kg) -- 85.639 kg 87 kg      Telemetry    V-paced rhythm, HR jumped around 6:45AM this morning, correlating with her fall, HR has since gradually came back down. - Personally Reviewed  ECG    V-paced rhythm - Personally  Reviewed  Physical Exam   GEN: No acute distress.   Neck: No JVD Cardiac: RRR, no murmurs, rubs, or gallops.  Respiratory: Clear to auscultation bilaterally. GI: Soft, nontender, non-distended  MS: trace non-pitting edema; No deformity. Neuro:  Nonfocal  Psych: Normal affect   Labs    High Sensitivity Troponin:   Recent Labs  Lab 03/07/23 1213 03/08/23 0815  TROPONINIHS 48* 45*     Chemistry Recent Labs  Lab 03/07/23 1213 03/08/23 0439 03/10/23 0342 03/11/23 0457 03/12/23 0606  NA 136   < > 137 135 135  K 4.3   < > 4.0 4.6 4.6  CL 97*   < > 95* 94* 96*  CO2 32   < > 31 33* 28  GLUCOSE 106*   < > 95 92 93  BUN 27*   < > 27* 29* 30*  CREATININE 1.29*   < > 1.15* 1.19* 1.32*  CALCIUM 7.8*   < > 8.2* 8.2* 8.2*  MG  --   --  1.8 2.2  --   PROT  5.6*  --   --   --   --   ALBUMIN 2.1*  --   --   --   --   AST 19  --   --   --   --   ALT 14  --   --   --   --   ALKPHOS 54  --   --   --   --   BILITOT 0.5  --   --   --   --   GFRNONAA 39*   < > 45* 43* 38*  ANIONGAP 7   < > 11 8 11    < > = values in this interval not displayed.    Lipids No results for input(s): "CHOL", "TRIG", "HDL", "LABVLDL", "LDLCALC", "CHOLHDL" in the last 168 hours.  Hematology Recent Labs  Lab 03/08/23 0439 03/09/23 0420 03/10/23 0342  WBC 5.6 6.3 6.2  RBC 3.17* 3.40* 3.39*  HGB 9.2* 9.9* 9.9*  HCT 30.1* 31.8* 31.7*  MCV 95.0 93.5 93.5  MCH 29.0 29.1 29.2  MCHC 30.6 31.1 31.2  RDW 20.1* 20.0* 19.9*  PLT 243 266 275   Thyroid No results for input(s): "TSH", "FREET4" in the last 168 hours.  BNP Recent Labs  Lab 03/07/23 1213  BNP 1,390.1*    DDimer No results for input(s): "DDIMER" in the last 168 hours.   Radiology    DG Ankle 2 Views Right Result Date: 03/11/2023 CLINICAL DATA:  Pain EXAM: RIGHT ANKLE - 2 VIEW COMPARISON:  None Available. FINDINGS: Soft tissue swelling about the ankle. Osteopenia. No fracture or dislocation. There is subchondral lucency along the talar dome  medially. Possible osteochondral abnormality. IMPRESSION: Osteopenia with lucency along the talar dome. Possible osteochondral abnormality. Soft tissue swelling. Electronically Signed   By: Karen Kays M.D.   On: 03/11/2023 13:54   DG Knee 1-2 Views Right Result Date: 03/11/2023 CLINICAL DATA:  Pain after fall EXAM: RIGHT KNEE - 2 VIEW COMPARISON:  None Available. FINDINGS: Osteopenia. Slight joint space loss of the patellofemoral joint and medial compartment. Small osteophytes. No fracture or dislocation. Trace joint fluid. Calcifications seen along the patellar tendon. Prominent vascular calcifications posterior to the knee. IMPRESSION: Osteopenia with mild degenerative changes. Trace joint fluid. Please correlate with clinical findings. Electronically Signed   By: Karen Kays M.D.   On: 03/11/2023 12:59    Cardiac Studies   Echo 03/08/2023 1. Left ventricular ejection fraction, by estimation, is 50 to 55%. The  left ventricle has low normal function. Left ventricular endocardial  border not optimally defined to evaluate regional wall motion. Left  ventricular diastolic parameters are  indeterminate.   2. D-shaped septum suggestive of RV pressure/volume overload. Right  ventricular systolic function is severely reduced. The right ventricular  size is moderately enlarged. There is moderately elevated pulmonary artery  systolic pressure. The estimated  right ventricular systolic pressure is 59.6 mmHg.   3. Right atrial size was mildly dilated.   4. The mitral valve is normal in structure. No evidence of mitral valve  regurgitation. No evidence of mitral stenosis.   5. The aortic valve is tricuspid. There is mild calcification of the  aortic valve. Aortic valve regurgitation is trivial. No aortic stenosis is  present.   6. Tricuspid valve regurgitation is moderate.   7. Aortic dilatation noted. There is mild dilatation of the ascending  aorta, measuring 43 mm.   8. The inferior vena cava  is normal in size with greater than 50%  respiratory variability, suggesting right atrial pressure of 3 mmHg.   9. A small pericardial effusion is present. The pericardial effusion is  posterior and lateral to the left ventricle.   Patient Profile     89 y.o. female with PMH of HFpEF, CHB s/p PPM 07/2022, pulm hypertension, CAD s/p PCI of RCA and repeat PCI 2017 for ISR, acute PE/DVT s/p IVC filture, AAA, HTN, CKD, Crohn's disease with recurrent diverticular bleed 2020, recent admission for CAP with chronic resp failure on 4L supplemental O2 who presented with CHF  Assessment & Plan    Acute on chronic diastolic heart failure / RV failure  - Echo 03/08/2023 EF 50-55%, D shaped septum suggestive of RV pressure/volume overload, RV systolic function severely reduced, moderate TR, RVSP 59.6 mmHg, dilated ascending aorta measuring 43 mm, small pericardial effusion  - hypoalbuminemia also contributing to volume overload  - transitioned to PO torsemide.  midodrine 10mg  TID added during this admission. Home beta blocker off due to soft BP  - euvolemic this morning, stable from cardiac perspective.  Creatinine mildly trending up but appears similar to baseline, continue p.o. torsemide 20 mg daily.  BP remains soft, would continue midodrine  Acute on chronic hypoxic respiratory failure: multifactorial with diastolic CHF, COPD, recent PE  CHB s/p PPM  CAD: borderline flat trop likely demand ischemia related to CHF exacerbation  Recent PE/DVT: diagnosed last month, s/p IV filter given history of GI bleed  Fall: occurred in the morning of 3/3, complained of R knee and R ankle pain      For questions or updates, please contact [facility] Please consult [email or web address] for contact info under        Signed, Little Ishikawa, MD  03/12/2023, 11:05 AM

## 2023-03-12 NOTE — Progress Notes (Signed)
 [facility]                                                                              Felicia Benson, is a 89 y.o. female, DOB - 06/01/1930, [id number] Admit date - 03/07/2023    Outpatient Primary MD for the patient is [name], MD  LOS - 5  days  Chief Complaint  Patient presents with   Weakness       Brief summary   Patient is a 89 year old female with chronic diastolic CHF, PE/DVT status post IVC filter, HTN, HLP, COPD, CAD status post PCI of RCA, chronic respiratory failure with hypoxia on 4 L O2 via  at home with recent admission for CAP PNA, PE/LLE DVT status post IVC filter (02/07/2023-02/19/2023) presented with shortness of breath, noted to be hypoxic at SNF with O2 sats as low as 77%.  Patient was placed on 8 L O2 via HFNC in the ED.  Patient also noted to have residual nonproductive cough, significant lower extremity swelling, received Lasix 40 mg IV x 1 in ED.  She was also hypotensive on arrival and was placed on midodrine 10 mg 3 times daily.  No acute chest pain or shortness of breath. Chest x-ray showed small layering right pleural effusion, troponin 48-45, BNP 1390.   Subjective:  -Aching in her knee and ankle  Assessment & Plan   Acute on chronic respiratory failure with hypoxia (HCC) -Multifactorial with acute on chronic diastolic CHF, COPD, pulmonary hypertension, recent multifocal pneumonia, recent segmental PE -Improving, down to 3 L which is her baseline  Acute on chronic diastolic CHF, RV failure Moderate to severe pulmonary hypertension Coronary artery disease - 2D echo showed EF 50 to 55% with D-shaped IVS consistent with pressure and volume overload, severe RV dysfunction, moderate to severe pulmonary hypertension -Complicated by hypoalbuminemia (albumin 2.1) and third spacing  - 8.3 L negative, cards following transitioned to oral torsemide -GDMT limited by hypotension, continue midodrine -TED hose, blood pressure low this  morning, will monitor -Increase activity, PT eval  Fall, angle and knee pain -X-rays without acute findings, continue PT, supportive care    COPD (chronic obstructive pulmonary disease) (HCC) Recent multifocal pneumonia -Known COPD with smoking history -Monitor, no wheezing at this time  History of recent pulmonary embolism, subsegmental PE, LLE DVT -During previous admission, status post IVC filter.  Was not able to tolerate anticoagulation, has history of recurrent diverticular bleeds  History of complete heart block -Status post PPM   Essential hypertension -Soft but stable, continue midodrine 10 mg 3 times daily  History of Crohn's disease, GERD -Continue Pentasa -Continue Protonix    Anemia of chronic disease -Hemoglobin 9.2, at baseline.  Hemoglobin was 8.8 on discharge on 02/14/2023, now stable/improving  Obesity class II Estimated body mass index is 30.47 kg/m as calculated from the following:   Height as of this encounter: 5\' 6"  (1.676 m).   Weight as of this encounter: 85.6 kg.  Goals of care -Multiple comorbid conditions, recurrent admissions, Dr. Isidoro Donning requested palliative medicine consult for GOC.  Meeting completed   Code Status: DNR DVT Prophylaxis: Lovenox Family Communication: No  family at bedside, discussed with patient in detail Disposition Plan:   SNF once stable, likely tomorrow   Procedures:  2D echo  Consultants:   Cardiology Palliative medicine  Antimicrobials:   Anti-infectives (From admission, onward)    None          Medications  aspirin EC  81 mg Oral Daily   atorvastatin  40 mg Oral QHS   cholecalciferol  5,000 Units Oral Daily   enoxaparin (LOVENOX) injection  40 mg Subcutaneous Q24H   ferrous sulfate  325 mg Oral Daily   lidocaine  1 patch Transdermal Daily   melatonin  3 mg Oral QHS   mesalamine  1,000 mg Oral BID   midodrine  10 mg Oral TID WC   mometasone-formoterol  2 puff Inhalation BID   montelukast  10 mg Oral  Daily   pantoprazole  40 mg Oral Daily   polyethylene glycol  17 g Oral Daily   senna-docusate  1 tablet Oral BID   sodium chloride flush  3 mL Intravenous Q12H   torsemide  20 mg Oral Daily     Objective:   Vitals:   03/12/23 0758 03/12/23 0930 03/12/23 1100 03/12/23 1205  BP: (!) 81/61  (!) 88/59 (!) 83/4  Pulse: (!) 112  (!) 116 (!) 112  Resp: 18 15 20 18   Temp: (!) 97 F (36.1 C)  98.2 F (36.8 C) (!) 97.5 F (36.4 C)  TempSrc: Oral  Oral Oral  SpO2: 95%  99% 100%  Weight:      Height:        Intake/Output Summary (Last 24 hours) at 03/12/2023 1221 Last data filed at 03/12/2023 1100 Gross per 24 hour  Intake 590 ml  Output 900 ml  Net -310 ml     Wt Readings from Last 3 Encounters:  03/10/23 85.6 kg  02/07/23 85.3 kg  12/28/22 85.3 kg     Gen: Awake, Alert, Oriented X 3,  HEENT: no JVD Lungs: Good air movement bilaterally, CTAB CVS: S1S2/RRR Abd: soft, Non tender, non distended, BS present Extremities: Trace edema, chronic skin changes Skin: no new rashes on exposed skin     Data Reviewed:  I have personally reviewed following labs    CBC Lab Results  Component Value Date   WBC 6.2 03/10/2023   RBC 3.39 (L) 03/10/2023   HGB 9.9 (L) 03/10/2023   HCT 31.7 (L) 03/10/2023   MCV 93.5 03/10/2023   MCH 29.2 03/10/2023   PLT 275 03/10/2023   MCHC 31.2 03/10/2023   RDW 19.9 (H) 03/10/2023   LYMPHSABS 0.9 03/10/2023   MONOABS 0.7 03/10/2023   EOSABS 0.1 03/10/2023   BASOSABS 0.0 03/10/2023     Last metabolic panel Lab Results  Component Value Date   NA 135 03/12/2023   K 4.6 03/12/2023   CL 96 (L) 03/12/2023   CO2 28 03/12/2023   BUN 30 (H) 03/12/2023   CREATININE 1.32 (H) 03/12/2023   GLUCOSE 93 03/12/2023   GFRNONAA 38 (L) 03/12/2023   GFRAA 44 (L) 05/21/2019   CALCIUM 8.2 (L) 03/12/2023   PHOS 3.5 02/19/2023   PROT 5.6 (L) 03/07/2023   ALBUMIN 2.1 (L) 03/07/2023   BILITOT 0.5 03/07/2023   ALKPHOS 54 03/07/2023   AST 19 03/07/2023    ALT 14 03/07/2023   ANIONGAP 11 03/12/2023    CBG (last 3)  No results for input(s): "GLUCAP" in the last 72 hours.    Coagulation Profile: No results for input(s): "INR", "  PROTIME" in the last 168 hours.   Radiology Studies: I have personally reviewed the imaging studies  DG Ankle 2 Views Right Result Date: 03/11/2023 CLINICAL DATA:  Pain EXAM: RIGHT ANKLE - 2 VIEW COMPARISON:  None Available. FINDINGS: Soft tissue swelling about the ankle. Osteopenia. No fracture or dislocation. There is subchondral lucency along the talar dome medially. Possible osteochondral abnormality. IMPRESSION: Osteopenia with lucency along the talar dome. Possible osteochondral abnormality. Soft tissue swelling. Electronically Signed   By: Karen Kays M.D.   On: 03/11/2023 13:54   DG Knee 1-2 Views Right Result Date: 03/11/2023 CLINICAL DATA:  Pain after fall EXAM: RIGHT KNEE - 2 VIEW COMPARISON:  None Available. FINDINGS: Osteopenia. Slight joint space loss of the patellofemoral joint and medial compartment. Small osteophytes. No fracture or dislocation. Trace joint fluid. Calcifications seen along the patellar tendon. Prominent vascular calcifications posterior to the knee. IMPRESSION: Osteopenia with mild degenerative changes. Trace joint fluid. Please correlate with clinical findings. Electronically Signed   By: Karen Kays M.D.   On: 03/11/2023 12:59        Zannie Cove M.D. [facility] 03/12/2023, 12:21 PM  Available via Epic secure chat 7am-7pm After 7 pm, please refer to night coverage provider listed on amion.

## 2023-03-12 NOTE — Care Management Important Message (Signed)
 Important Message  Patient Details  Name: Felicia Benson MRN: [id number] Date of Birth: [birth year]   Important Message Given:  Yes - Medicare IM     Renie Ora 03/12/2023, 1:03 PM

## 2023-03-12 NOTE — TOC Progression Note (Signed)
 Transition of Care [facility]) - Progression Note    Patient Details  Name: Felicia Benson MRN: [id number] Date of Birth: 1930/12/26  Transition of Care [facility]) CM/SW Contact  Delilah Shan, LCSWA Phone Number: 03/12/2023, 12:31 PM  Clinical Narrative:     Patient has SNF bed at [facility] when medically ready for dc. CSW will continue to follow.  Expected Discharge Plan: Skilled Nursing Facility Barriers to Discharge: Continued Medical Work up  Expected Discharge Plan and Services In-house Referral: Clinical Social Work     Living arrangements for the past 2 months: Skilled Nursing Facility                                       Social Determinants of Health (SDOH) Interventions SDOH Screenings   Food Insecurity: No Food Insecurity (03/07/2023)  Housing: Low Risk  (03/07/2023)  Recent Concern: Housing - High Risk (12/28/2022)  Transportation Needs: No Transportation Needs (03/07/2023)  Utilities: Not At Risk (03/07/2023)  Social Connections: Moderately Isolated (03/07/2023)  Tobacco Use: Medium Risk (03/10/2023)    Readmission Risk Interventions    07/24/2022    2:49 PM  Readmission Risk Prevention Plan  Transportation Screening Complete  PCP or Specialist Appt within 5-7 Days Complete  Home Care Screening Complete  Medication Review (RN CM) Complete

## 2023-03-12 NOTE — Progress Notes (Signed)
 Physical Therapy Treatment Patient Details Name: Felicia Benson MRN: [id number] DOB: 1930-02-12 Today's Date: 03/12/2023   History of Present Illness Pt is a 89 y/o female presenting 03/07/23 with a non-productive cough, leg swelling, and weakness. Admitted with exacerbation of CHF. Of note, pt was previously admitted 02/19/23 for CAP, PE, and DVT. PMHx AAA, CHF, Crohn's Disease, Diverticulitis, diverticulosis, GERD, GI bleed, HTN, OSA, pacemaker, thyroid nodule.    PT Comments  Pt dozing on entry, rouses easily. Pt understandably dubious of getting up after fall yesterday, but PT placed drop arm recliner next to bed and explained that she would just need to squat pivot to recliner. Prior to mobilization initiated RLE AAROM at knee. ROM limited by pain. Pt ultimately agreeable to try to get to recliner.  In supine pt HR in 120s, BP 83/64. Pt requires modA for coming to EoB where BP 116/70 and HR max noted 132 bpm. Deferred transfer to recliner, but with RN in room agreeable to have pt stand so bed could be zeroed to get accurate weight. PT stands with maxA, and attempts to take lateral steps to [facility] but can not tolerate R knee pain with weightshift to RLE. Pt sits back on EOB, and with modAx2 returns to supine, where HR in 60 bpm, and BP 81/61. D/c plans remain appropriate at this time. PT will continue to follow acutely.   If plan is discharge home, recommend the following: A little help with walking and/or transfers;A little help with bathing/dressing/bathroom;Assistance with cooking/housework;Assist for transportation;Help with stairs or ramp for entrance   Can travel by private vehicle     Yes  Equipment Recommendations  None recommended by PT    Recommendations for Other Services       Precautions / Restrictions Precautions Precautions: Fall Recall of Precautions/Restrictions: Intact Restrictions Weight Bearing Restrictions Per Provider Order: No     Mobility  Bed Mobility Overal bed  mobility: Needs Assistance Bed Mobility: Supine to Sit     Supine to sit: HOB elevated, Used rails, Mod assist Sit to supine: Mod assist, +2 for physical assistance   General bed mobility comments: requires modA for advancement of RLE off bed, and min A for bringing trunk to upright, modAx2 for returning LE and trunk to bed    Transfers Overall transfer level: Needs assistance Equipment used: Rolling walker (2 wheels) Transfers: Sit to/from Stand Sit to Stand: From elevated surface, Max assist           General transfer comment: pt require maxA on R side to come all the way to upright, pt attempts to take lateral steps to L towards HoB but unable to shift weight to R LE          Balance Overall balance assessment: Needs assistance Sitting-balance support: Feet supported, No upper extremity supported Sitting balance-Leahy Scale: Fair Sitting balance - Comments: Pt sits EOB without LOB Postural control:  (anterior lean) Standing balance support: Bilateral upper extremity supported, During functional activity, Reliant on assistive device for balance Standing balance-Leahy Scale: Poor Standing balance comment: Pt reliant on RW to stand.  Stands with increased trunk flexion                            Communication Communication Communication: No apparent difficulties  Cognition Arousal: Alert Behavior During Therapy: WFL for tasks assessed/performed   PT - Cognitive impairments: No apparent impairments  Following commands: Intact      Cueing Cueing Techniques: Verbal cues  Exercises General Exercises - Lower Extremity Ankle Circles/Pumps: AROM, Both, 15 reps Heel Slides: AAROM, Right, 10 reps, Supine    General Comments General comments (skin integrity, edema, etc.): in supine HR 120 bpm BP 83/64, with sitting EoB HR noted, BP 116/70, with return to supine HR 65 bpm and BP 81/61, SpO2 on 5L O2 via [place] during  mobilization 89-93% SpO2      Pertinent Vitals/Pain Pain Assessment Pain Assessment: Faces Faces Pain Scale: Hurts even more Pain Location: R knee with movement Pain Descriptors / Indicators: Grimacing, Guarding, Moaning Pain Intervention(s): Limited activity within patient's tolerance, Monitored during session, Repositioned     PT Goals (current goals can now be found in the care plan section) Acute Rehab PT Goals Patient Stated Goal: Return to SNF long term PT Goal Formulation: With patient Time For Goal Achievement: 03/22/23 Potential to Achieve Goals: Good Progress towards PT goals: Progressing toward goals (since fall  03/11/2023)    Frequency    Min 1X/week       AM-PAC PT "6 Clicks" Mobility   Outcome Measure  Help needed turning from your back to your side while in a flat bed without using bedrails?: A Lot Help needed moving from lying on your back to sitting on the side of a flat bed without using bedrails?: A Lot Help needed moving to and from a bed to a chair (including a wheelchair)?: A Lot Help needed standing up from a chair using your arms (e.g., wheelchair or bedside chair)?: A Lot Help needed to walk in hospital room?: Total Help needed climbing 3-5 steps with a railing? : Total 6 Click Score: 10    End of Session Equipment Utilized During Treatment: Gait belt;Oxygen Activity Tolerance: Patient limited by fatigue Patient left: in bed;with call bell/phone within reach;with bed alarm set (set up for lunch) Nurse Communication: Mobility status PT Visit Diagnosis: Unsteadiness on feet (R26.81);Other abnormalities of gait and mobility (R26.89);Muscle weakness (generalized) (M62.81);Difficulty in walking, not elsewhere classified (R26.2)     Time: 1610-9604 PT Time Calculation (min) (ACUTE ONLY): 49 min  Charges:    $Therapeutic Exercise: 8-22 mins $Therapeutic Activity: 23-37 mins PT General Charges $$ ACUTE PT VISIT: 1 Visit                      Lakia Gritton B. Beverely Risen PT, DPT Acute Rehabilitation Services Please use secure chat or  Call Office [phone]    Elon Alas Fleet 03/12/2023, 1:50 PM

## 2023-03-13 DIAGNOSIS — N179 Acute kidney failure, unspecified: Secondary | ICD-10-CM | POA: Diagnosis not present

## 2023-03-13 DIAGNOSIS — I5033 Acute on chronic diastolic (congestive) heart failure: Secondary | ICD-10-CM | POA: Diagnosis not present

## 2023-03-13 DIAGNOSIS — J9621 Acute and chronic respiratory failure with hypoxia: Secondary | ICD-10-CM | POA: Diagnosis not present

## 2023-03-13 LAB — BASIC METABOLIC PANEL
Anion gap: 15 (ref 5–15)
BUN: 38 mg/dL — ABNORMAL HIGH (ref 8–23)
CO2: 28 mmol/L (ref 22–32)
Calcium: 8.1 mg/dL — ABNORMAL LOW (ref 8.9–10.3)
Chloride: 90 mmol/L — ABNORMAL LOW (ref 98–111)
Creatinine, Ser: 1.6 mg/dL — ABNORMAL HIGH (ref 0.44–1.00)
GFR, Estimated: 30 mL/min — ABNORMAL LOW (ref >60)
Glucose, Bld: 110 mg/dL — ABNORMAL HIGH (ref 70–99)
Potassium: 4.5 mmol/L (ref 3.5–5.1)
Sodium: 133 mmol/L — ABNORMAL LOW (ref 135–145)

## 2023-03-13 LAB — LACTIC ACID, PLASMA: Lactic Acid, Venous: 1.5 mmol/L (ref 0.5–1.9)

## 2023-03-13 LAB — ALBUMIN: Albumin: 2.2 g/dL — ABNORMAL LOW (ref 3.5–5.0)

## 2023-03-13 MED ORDER — SODIUM CHLORIDE 0.9 % IV BOLUS: 250.0000 mL | INTRAVENOUS | Status: DC

## 2023-03-13 MED ORDER — MIDODRINE HCL 5 MG PO TABS
10.0000 mg | ORAL_TABLET | ORAL | Status: DC
  Filled 2023-03-13: qty 2

## 2023-03-13 NOTE — Plan of Care (Signed)
  Problem: Health Behavior/Discharge Planning: Goal: Ability to manage health-related needs will improve Outcome: Progressing   Problem: Clinical Measurements: Goal: Respiratory complications will improve Outcome: Progressing Goal: Cardiovascular complication will be avoided Outcome: Progressing   Problem: Elimination: Goal: Will not experience complications related to urinary retention Outcome: Progressing   Problem: Pain Managment: Goal: General experience of comfort will improve and/or be controlled Outcome: Progressing   Problem: Safety: Goal: Ability to remain free from injury will improve Outcome: Progressing

## 2023-03-13 NOTE — Progress Notes (Signed)
 [facility]                                                                              Felicia Benson, is a 89 y.o. female, DOB - Aug 19, 1930, GNF:[id number] Admit date - 03/07/2023    Outpatient Primary MD for the patient is [name], MD  LOS - 6  days  Chief Complaint  Patient presents with   Weakness       Brief summary   Patient is a 89 year old female with chronic diastolic CHF, PE/DVT status post IVC filter, HTN, HLP, COPD, CAD status post PCI of RCA, chronic respiratory failure with hypoxia on 4 L O2 via [place] at home with recent admission for CAP PNA, PE/LLE DVT status post IVC filter (02/07/2023-02/19/2023) presented with shortness of breath, noted to be hypoxic at SNF with O2 sats as low as 77%.  Patient was placed on 8 L O2 via HFNC in the ED. also noted to be hypotensive Chest x-ray showed small layering right pleural effusion, troponin 48-45, BNP 1390., -Admitted, started on diuretics, midodrine continued, cards following, limited by hypotension   Subjective:  -Feels fair, blood pressure in the 90s  Assessment & Plan   Acute on chronic respiratory failure with hypoxia (HCC) -Multifactorial with acute on chronic diastolic CHF, COPD, pulmonary hypertension, recent multifocal pneumonia, recent segmental PE -Improving, down to 3-4L which is her baseline  Acute on chronic diastolic CHF, RV failure Moderate to severe pulmonary hypertension Coronary artery disease - 2D echo showed EF 50 to 55% with D-shaped IVS consistent with pressure and volume overload, severe RV dysfunction, moderate to severe pulmonary hypertension -Complicated by hypoalbuminemia (albumin 2.1) and third spacing  -Diuresed with IV Lasix, 7.4 L negative, weight down 13 LB -Was transitioned to oral torsemide, hold diuretics with AKI and low BP  -GDMT limited by hypotension, continue midodrine, compression stockings -Increase activity, PT eval -Evaluated by palliative care this  admission, remains DNR, wishes to "continue treating the treatable"  Fall, angle and knee pain -X-rays without acute findings, continue PT, supportive care    COPD (chronic obstructive pulmonary disease) (HCC) Recent multifocal pneumonia -Known COPD with smoking history -Monitor, no wheezing   History of recent pulmonary embolism, subsegmental PE, LLE DVT -During previous admission, status post IVC filter.  Was not able to tolerate anticoagulation, has history of recurrent diverticular bleeds  History of complete heart block -Status post PPM   Essential hypertension -Soft but stable, continue midodrine 10 mg 3 times daily  History of Crohn's disease, GERD -Continue Pentasa -Continue Protonix    Anemia of chronic disease -Hemoglobin 9.2, at baseline.  Hemoglobin was 8.8 on discharge on 02/14/2023, now stable/improving  Obesity class II Estimated body mass index is 30.28 kg/m as calculated from the following:   Height as of this encounter: 5\' 6"  (1.676 m).   Weight as of this encounter: 85.1 kg.  Goals of care -Multiple comorbid conditions, recurrent admissions seen by palliative care -Remains DNR, patient wishes to continue to treat the treatable and seek long-term placement at [facility] haven   Code Status: DNR DVT Prophylaxis: Lovenox Family Communication: No family at bedside,  discussed with patient in detail Disposition Plan:   SNF when stable, hopefully 1 to 2 days   Procedures:  2D echo  Consultants:   Cardiology Palliative medicine  Antimicrobials:   Anti-infectives (From admission, onward)    None          Medications  aspirin EC  81 mg Oral Daily   atorvastatin  40 mg Oral QHS   cholecalciferol  5,000 Units Oral Daily   enoxaparin (LOVENOX) injection  40 mg Subcutaneous Q24H   ferrous sulfate  325 mg Oral Daily   lidocaine  1 patch Transdermal Daily   melatonin  3 mg Oral QHS   mesalamine  1,000 mg Oral BID   midodrine  10 mg Oral TID WC    mometasone-formoterol  2 puff Inhalation BID   montelukast  10 mg Oral Daily   pantoprazole  40 mg Oral Daily   polyethylene glycol  17 g Oral Daily   senna-docusate  1 tablet Oral BID   sodium chloride flush  3 mL Intravenous Q12H     Objective:   Vitals:   03/13/23 0607 03/13/23 0710 03/13/23 0800 03/13/23 1100  BP: 95/69 99/68  104/78  Pulse: (!) 105 (!) 104 (!) 107 (!) 112  Resp: 20 20 20 20   Temp: 99.7 F (37.6 C) 98.2 F (36.8 C)  98.2 F (36.8 C)  TempSrc: Oral Oral  Oral  SpO2: 94% 99%  99%  Weight: 85.1 kg     Height:        Intake/Output Summary (Last 24 hours) at 03/13/2023 1138 Last data filed at 03/13/2023 0800 Gross per 24 hour  Intake 960 ml  Output 600 ml  Net 360 ml     Wt Readings from Last 3 Encounters:  03/13/23 85.1 kg  02/07/23 85.3 kg  12/28/22 85.3 kg     Gen: Obese chronically ill female sitting up in bed, AAOx3 HEENT: No JVD CVS: S1-S2, regular rhythm Lungs: Decreased breath sounds at the bases Abdomen: Soft, nontender, bowel sounds present Extremities: Trace edema, chronic skin changes Skin: no new rashes on exposed skin     Data Reviewed:  I have personally reviewed following labs    CBC Lab Results  Component Value Date   WBC 6.2 03/10/2023   RBC 3.39 (L) 03/10/2023   HGB 9.9 (L) 03/10/2023   HCT 31.7 (L) 03/10/2023   MCV 93.5 03/10/2023   MCH 29.2 03/10/2023   PLT 275 03/10/2023   MCHC 31.2 03/10/2023   RDW 19.9 (H) 03/10/2023   LYMPHSABS 0.9 03/10/2023   MONOABS 0.7 03/10/2023   EOSABS 0.1 03/10/2023   BASOSABS 0.0 03/10/2023     Last metabolic panel Lab Results  Component Value Date   NA 133 (L) 03/13/2023   K 4.5 03/13/2023   CL 90 (L) 03/13/2023   CO2 28 03/13/2023   BUN 38 (H) 03/13/2023   CREATININE 1.60 (H) 03/13/2023   GLUCOSE 110 (H) 03/13/2023   GFRNONAA 30 (L) 03/13/2023   GFRAA 44 (L) 05/21/2019   CALCIUM 8.1 (L) 03/13/2023   PHOS 3.5 02/19/2023   PROT 5.6 (L) 03/07/2023   ALBUMIN 2.2 (L)  03/13/2023   BILITOT 0.5 03/07/2023   ALKPHOS 54 03/07/2023   AST 19 03/07/2023   ALT 14 03/07/2023   ANIONGAP 15 03/13/2023    CBG (last 3)  No results for input(s): "GLUCAP" in the last 72 hours.    Coagulation Profile: No results for input(s): "INR", "PROTIME" in the last 168  hours.   Radiology Studies: I have personally reviewed the imaging studies  DG Ankle 2 Views Right Result Date: 03/11/2023 CLINICAL DATA:  Pain EXAM: RIGHT ANKLE - 2 VIEW COMPARISON:  None Available. FINDINGS: Soft tissue swelling about the ankle. Osteopenia. No fracture or dislocation. There is subchondral lucency along the talar dome medially. Possible osteochondral abnormality. IMPRESSION: Osteopenia with lucency along the talar dome. Possible osteochondral abnormality. Soft tissue swelling. Electronically Signed   By: Karen Kays M.D.   On: 03/11/2023 13:54        Zannie Cove M.D. [facility] 03/13/2023, 11:38 AM  Available via Epic secure chat 7am-7pm After 7 pm, please refer to night coverage provider listed on amion.

## 2023-03-13 NOTE — Progress Notes (Signed)
 Notified Night oncall provider for low BP 67/47. with new orders by provider. Patient denies any symptoms.    03/13/23 1942  Provider Notification  Provider Name/Title Dr Dow Adolph  Date Provider Notified 03/13/23  Time Provider Notified 1942  Method of Notification Page (secured chat)  Notification Reason Other (Comment) (BP low 67/47. Checked both arms. Pt denies any symptoms at the moment.)  Provider response See new orders (Give another dose of midodrine 10mg  now)  Date of Provider Response 03/13/23  Time of Provider Response 1944     Update: At around 2000H, noted patient was not hooked up to her oxygen, sats on the low 80s. Patient denies any shortness of breath or dufficulty in breathing. This RN hooked the patient back to her oxygen at 4L [place]. Rechecked vitals signs.  At 2016H, BP at 96/56, HR 111, RR 21, SPO2 95% on 4L [place]. Notified Dr Margo Aye and held previous orders of saline bolus and stat midodrine.  Felicity Coyer, RN

## 2023-03-13 NOTE — Progress Notes (Addendum)
 Patient Name: Felicia Benson Date of Encounter: 03/13/2023 [facility] Cardiologist: Chilton Si, MD   Interval Summary  .    This morning, patient generally feels well. Labs show increase in creatinine, from 1.32->1.60. BP remains low but slight improvement from yesterday. Patient in no apparent respiratory difficulty this morning. On 4LPM (home flow is 3LPM). She tells me today, "I have never had shortness of breath this admission."  Vital Signs .    Vitals:   03/12/23 2141 03/12/23 2240 03/12/23 2340 03/13/23 0607  BP: 99/62  96/66 95/69  Pulse: (!) 101 91 95 (!) 105  Resp: 20 18 15 20   Temp:   98 F (36.7 C) 99.7 F (37.6 C)  TempSrc:   Oral Oral  SpO2:   98% 94%  Weight:    85.1 kg  Height:        Intake/Output Summary (Last 24 hours) at 03/13/2023 0753 Last data filed at 03/13/2023 0658 Gross per 24 hour  Intake 1310 ml  Output 600 ml  Net 710 ml      03/13/2023    6:07 AM 03/12/2023    7:09 AM 03/12/2023    5:00 AM  Last 3 Weights  Weight (lbs) 187 lb 9.8 oz 189 lb 9.5 oz --  Weight (kg) 85.1 kg 86 kg --      Telemetry/ECG    V paced rhythm, rates elevated over 100 this morning - Personally Reviewed  Physical Exam .   GEN: No acute distress.   Neck: JVP 2cm above clavicle with HOB at 30 degrees Cardiac: RRR, no murmurs, rubs, or gallops.  Respiratory: basilar crackles LLL GI: Soft, nontender, non-distended  MS: trace-1+ bilaterally LE. Legs in compression stockings  Assessment & Plan .     Acute on chronic diastolic CHF with RV failure Patient admitted with symptoms concerning for acute CHF. TTE showed LVEF 50-55% with D shaped septum and severely reduced RV function. Moderate TR also seen with RVSP 59.42mmHg. Small pericardial effusion noted. Albumin low on admission, 2.1, appears to be a chronic issue. Given mixed appearance of mild volume overload today with labs indicating small AKI, suspect that hypoalbuminemia is significant factor in  edema. Recheck albumin this morning. Hold Torsemide this morning as well as beta blocker Continue midodrine 10mg  TID Given the frequency of admissions in the last 3 months and complicating co-morbidities, would strongly consider involving palliative care to better direct goals of care.  Acute on chronic hypoxic respiratory failure with CHF, COPD, PE Patient with admission in January in the setting of multi-focal pneumonia, subsegmental PE/DVT (s/p IVC filter with no DOAC due to recent GI bleed).  Patient in no apparent respiratory difficulty this morning. On 4LPM (home flow is 3LPM). She tells me today, "I have never had shortness of breath this admission." Management per primary team  CAD Patient with minimally elevated and flat troponin 48->45. No chest pain, consistent with demand ischemia.  CHB s/p PPM Patient with stable device function, V pacing. Rates increased this morning.  PE/DVT Found with subsegmental PE last month. Had IVC filter placed due to inability to start DOAC with recent GI bleed.   S/P Fall Patient with fall this admission, continues to have some right knee pain and is not able to bear much weight on this leg.   For questions or updates, please contact [facility] Please consult [email or web address] for contact info under        Signed, Perlie Gold, PA-C  Patient seen and examined.  Agree with above documentation.  On exam, patient is alert and oriented, regular rate and rhythm, no murmurs, lungs CTAB, trace LE edema.  BP has been soft, continues to require midodrine.  Creatinine rising.  Will hold her torsemide today.  Little Ishikawa, MD

## 2023-03-13 NOTE — Progress Notes (Signed)
 Occupational Therapy Treatment Patient Details Name: ETHIE CURLESS MRN: [id number] DOB: 1931/01/05 Today's Date: 03/13/2023   History of present illness Pt is a 89 y/o female presenting 03/07/23 with a non-productive cough, leg swelling, and weakness. Admitted with exacerbation of CHF. Of note, pt was previously admitted 02/19/23 for CAP, PE, and DVT. PMHx AAA, CHF, Crohn's Disease, Diverticulitis, diverticulosis, GERD, GI bleed, HTN, OSA, pacemaker, thyroid nodule.   OT comments  Patient received in supine and agreeable to work with OT. Patient declines OOB transfer or standing from EOB but agreeable to sitting on EOB. Patient requiring assistance with RLE and mod assist to get to EOB. Patient able to tolerate sitting on EOB 8+ minutes on EOB before complaining of fatigue and asking to return to supine. Patient requiring max assist of 1 to return to supine and for positioning in bed. Patient will benefit from continued inpatient follow up therapy, <3 hours/day. Acute OT to continue to follow to address established goals to facilitate DC to next venue of care.        If plan is discharge home, recommend the following:  Assistance with cooking/housework;Assist for transportation;Help with stairs or ramp for entrance;A lot of help with bathing/dressing/bathroom;A lot of help with walking and/or transfers   Equipment Recommendations  Other (comment) (TBD at next venue of care)    Recommendations for Other Services      Precautions / Restrictions Precautions Precautions: Fall Recall of Precautions/Restrictions: Intact Restrictions Weight Bearing Restrictions Per Provider Order: No       Mobility Bed Mobility Overal bed mobility: Needs Assistance Bed Mobility: Supine to Sit, Sit to Supine     Supine to sit: HOB elevated, Used rails, Mod assist Sit to supine: Max assist   General bed mobility comments: Assistance to get RLE toward EOB, bed pad used to assist with scooting hips,  Assistance with trunk and BLEs to return to supine    Transfers Overall transfer level: Needs assistance                 General transfer comment: patient declined OOB or standing     Balance Overall balance assessment: Needs assistance Sitting-balance support: Feet supported, No upper extremity supported Sitting balance-Leahy Scale: Fair Sitting balance - Comments: Pt sits EOB without LOB                                   ADL either performed or assessed with clinical judgement   ADL Overall ADL's : Needs assistance/impaired     Grooming: Wash/dry hands;Wash/dry face;Sitting;Contact guard assist                                 General ADL Comments: focused on bed mobility and EOB sitting tolerance    Extremity/Trunk Assessment Upper Extremity Assessment Upper Extremity Assessment: Generalized weakness            Vision       Perception     Praxis     Communication Communication Communication: No apparent difficulties   Cognition Arousal: Alert Behavior During Therapy: WFL for tasks assessed/performed Cognition: No apparent impairments                               Following commands: Intact        Cueing  Cueing Techniques: Verbal cues  Exercises Exercises: General Upper Extremity General Exercises - Upper Extremity Shoulder Flexion: AROM, Both, 10 reps, Seated Shoulder ABduction: AROM, Both, 10 reps, Seated    Shoulder Instructions       General Comments BP supine 111/74(84), seated on EOB 109/70(83), 106/70 (82) back in supine. HR increased to 120's while on EOB and 110s in supine    Pertinent Vitals/ Pain       Pain Assessment Pain Assessment: Faces Faces Pain Scale: Hurts even more Pain Location: R knee with movement Pain Descriptors / Indicators: Grimacing, Guarding Pain Intervention(s): Limited activity within patient's tolerance, Monitored during session, Repositioned  Home Living                                           Prior Functioning/Environment              Frequency  Min 1X/week        Progress Toward Goals  OT Goals(current goals can now be found in the care plan section)  Progress towards OT goals: Progressing toward goals  Acute Rehab OT Goals Patient Stated Goal: less pain OT Goal Formulation: With patient Time For Goal Achievement: 03/23/23 Potential to Achieve Goals: Good ADL Goals Pt Will Perform Grooming: with supervision;with set-up;sitting Pt Will Perform Upper Body Bathing: with supervision;with set-up;sitting Pt Will Perform Lower Body Bathing: with mod assist;with min assist;sitting/lateral leans Pt Will Perform Upper Body Dressing: with supervision;with set-up;sitting Pt Will Transfer to Toilet: with min assist;with contact guard assist;ambulating;stand pivot transfer;bedside commode Pt Will Perform Toileting - Clothing Manipulation and hygiene: with mod assist;with min assist;sitting/lateral leans;sit to/from stand  Plan      Co-evaluation                 AM-PAC OT "6 Clicks" Daily Activity     Outcome Measure   Help from another person eating meals?: None Help from another person taking care of personal grooming?: A Little Help from another person toileting, which includes using toliet, bedpan, or urinal?: A Lot Help from another person bathing (including washing, rinsing, drying)?: A Lot Help from another person to put on and taking off regular upper body clothing?: A Little Help from another person to put on and taking off regular lower body clothing?: A Lot 6 Click Score: 16    End of Session Equipment Utilized During Treatment: Oxygen  OT Visit Diagnosis: Unsteadiness on feet (R26.81);Other abnormalities of gait and mobility (R26.89);Repeated falls (R29.6);Muscle weakness (generalized) (M62.81)   Activity Tolerance Patient tolerated treatment well;Patient limited by pain   Patient Left in  bed;with call bell/phone within reach;with bed alarm set   Nurse Communication Mobility status        Time: 4098-1191 OT Time Calculation (min): 17 min  Charges: OT General Charges $OT Visit: 1 Visit OT Treatments $Therapeutic Activity: 8-22 mins  Alfonse Flavors, OTA Acute Rehabilitation Services  Office [phone]   Dewain Penning 03/13/2023, 2:26 PM

## 2023-03-13 NOTE — Plan of Care (Signed)

## 2023-03-13 NOTE — TOC Progression Note (Signed)
 Transition of Care [facility]) - Progression Note    Patient Details  Name: Felicia Benson MRN: [id number] Date of Birth: 11/18/1930  Transition of Care [facility]) CM/SW Contact  Delilah Shan, LCSWA Phone Number: 03/13/2023, 12:03 PM  Clinical Narrative:     Patient has SNF bed at [facility] when medically ready. CSW will continue to follow.  Expected Discharge Plan: Skilled Nursing Facility Barriers to Discharge: Continued Medical Work up  Expected Discharge Plan and Services In-house Referral: Clinical Social Work     Living arrangements for the past 2 months: Skilled Nursing Facility                                       Social Determinants of Health (SDOH) Interventions SDOH Screenings   Food Insecurity: No Food Insecurity (03/07/2023)  Housing: Low Risk  (03/07/2023)  Recent Concern: Housing - High Risk (12/28/2022)  Transportation Needs: No Transportation Needs (03/07/2023)  Utilities: Not At Risk (03/07/2023)  Social Connections: Moderately Isolated (03/07/2023)  Tobacco Use: Medium Risk (03/10/2023)    Readmission Risk Interventions    07/24/2022    2:49 PM  Readmission Risk Prevention Plan  Transportation Screening Complete  PCP or Specialist Appt within 5-7 Days Complete  Home Care Screening Complete  Medication Review (RN CM) Complete

## 2023-03-14 DIAGNOSIS — J9621 Acute and chronic respiratory failure with hypoxia: Secondary | ICD-10-CM | POA: Diagnosis not present

## 2023-03-14 DIAGNOSIS — I5033 Acute on chronic diastolic (congestive) heart failure: Secondary | ICD-10-CM | POA: Diagnosis not present

## 2023-03-14 LAB — BRAIN NATRIURETIC PEPTIDE: B Natriuretic Peptide: 2510.3 pg/mL — ABNORMAL HIGH (ref 0.0–100.0)

## 2023-03-14 LAB — BASIC METABOLIC PANEL
Anion gap: 9 (ref 5–15)
BUN: 42 mg/dL — ABNORMAL HIGH (ref 8–23)
CO2: 28 mmol/L (ref 22–32)
Calcium: 7.4 mg/dL — ABNORMAL LOW (ref 8.9–10.3)
Chloride: 93 mmol/L — ABNORMAL LOW (ref 98–111)
Creatinine, Ser: 1.38 mg/dL — ABNORMAL HIGH (ref 0.44–1.00)
GFR, Estimated: 36 mL/min — ABNORMAL LOW (ref >60)
Glucose, Bld: 98 mg/dL (ref 70–99)
Potassium: 4 mmol/L (ref 3.5–5.1)
Sodium: 130 mmol/L — ABNORMAL LOW (ref 135–145)

## 2023-03-14 MED ORDER — TORSEMIDE 20 MG PO TABS
20.0000 mg | ORAL_TABLET | Freq: Every day | ORAL | Status: DC
  Administered 2023-03-14 – 2023-03-17 (×4): 20 mg via ORAL
  Filled 2023-03-14 (×4): qty 1

## 2023-03-14 NOTE — Progress Notes (Addendum)
 Patient Name: Felicia Benson Date of Encounter: 03/14/2023 [facility] Cardiologist: Chilton Si, MD   Interval Summary  .    Patient frustrated this morning that her O2 was accidentally dislodged from the wall tree yesterday evening. Continues to deny dyspnea. Does not have dizziness this morning.   Vital Signs .    Vitals:   03/13/23 2016 03/13/23 2035 03/14/23 0005 03/14/23 0553  BP: (!) 96/56  102/69 110/70  Pulse: (!) 111  98 96  Resp: 20  16 20   Temp: 98.3 F (36.8 C)  98 F (36.7 C) 98 F (36.7 C)  TempSrc: Oral  Oral Axillary  SpO2: 95% 97% 95% 93%  Weight:    88 kg  Height:        Intake/Output Summary (Last 24 hours) at 03/14/2023 0802 Last data filed at 03/14/2023 0557 Gross per 24 hour  Intake 480 ml  Output 650 ml  Net -170 ml      03/14/2023    5:53 AM 03/13/2023    6:07 AM 03/12/2023    7:09 AM  Last 3 Weights  Weight (lbs) 194 lb 0.1 oz 187 lb 9.8 oz 189 lb 9.5 oz  Weight (kg) 88 kg 85.1 kg 86 kg      Telemetry/ECG    V paced, rates 90s-low 100s - Personally Reviewed  Physical Exam .   GEN: No acute distress.   Neck: JVD midway to mandible Cardiac: RRR, no murmurs, rubs, or gallops.  Respiratory: Left lower lobe crackles, right lower lobe diminished GI: Soft, nontender, non-distended  MS: No edema  Assessment & Plan .     Acute on chronic diastolic CHF with RV failure Patient admitted with symptoms concerning for acute CHF. TTE showed LVEF 50-55% with D shaped septum and severely reduced RV function. Moderate TR also seen with RVSP 59.46mmHg. Small pericardial effusion noted. Albumin low on admission, 2.1, appears to be a chronic issue. Given mixed appearance of mild volume overload today with labs indicating small AKI, suspect that hypoalbuminemia is significant factor in edema. Recheck on 3/5 showed albumin 2.2. LLL crackles on exam today with increased elevation of JVP. Resume Torsemide 20mg  this morning with improved renal  function. GDMT otherwise limited by hypotension. May need every other day dosing at discharge. Continue midodrine 10mg  TID Given the frequency of admissions in the last 3 months and complicating co-morbidities, would strongly consider involving palliative care to better direct goals of care.   Acute on chronic hypoxic respiratory failure with CHF, COPD, PE Patient with admission in January in the setting of multi-focal pneumonia, subsegmental PE/DVT (s/p IVC filter with no DOAC due to recent GI bleed).  Patient in no apparent respiratory difficulty this morning. On 4LPM (home flow is 3LPM). She tells me today, "I have never had shortness of breath this admission." Management per primary team   CAD Patient with minimally elevated and flat troponin 48->45. No chest pain, consistent with demand ischemia.   CHB s/p PPM Patient with stable device function, V pacing. Rates stable.   PE/DVT Found with subsegmental PE last month. Had IVC filter placed due to inability to start DOAC with recent GI bleed.    S/P Fall Patient with fall this admission, continues to have some right knee pain and is not able to bear much weight on this leg.    For questions or updates, please contact [facility] Please consult [email or web address] for contact info under  Signed, Perlie Gold, PA-C   Patient seen and examined.  Agree with above documentation.  On exam, patient is alert and oriented, regular rate and rhythm, no murmurs, basilar crackles, mild LE edema.  Torsemide held yesterday with improvement in renal function and BP.  Appears mildly hypervolemic today, will restart torsemide.  May need every other day dosing.  Little Ishikawa, MD

## 2023-03-14 NOTE — Care Management Important Message (Signed)
 Important Message  Patient Details  Name: Felicia Benson MRN: [id number] Date of Birth: 1930/12/04   Important Message Given:  Yes - Medicare IM     Renie Ora 03/14/2023, 12:41 PM

## 2023-03-14 NOTE — TOC Progression Note (Signed)
 Transition of Care [facility]) - Progression Note    Patient Details  Name: OZZIE REMMERS MRN: [id number] Date of Birth: 05/06/1930  Transition of Care [facility]) CM/SW Contact  Delilah Shan, LCSWA Phone Number: 03/14/2023, 2:34 PM  Clinical Narrative:     Patient has SNF bed at [facility] when medically ready. CSW will continue to follow.   Expected Discharge Plan: Skilled Nursing Facility Barriers to Discharge: Continued Medical Work up  Expected Discharge Plan and Services In-house Referral: Clinical Social Work     Living arrangements for the past 2 months: Skilled Nursing Facility                                       Social Determinants of Health (SDOH) Interventions SDOH Screenings   Food Insecurity: No Food Insecurity (03/07/2023)  Housing: Low Risk  (03/07/2023)  Recent Concern: Housing - High Risk (12/28/2022)  Transportation Needs: No Transportation Needs (03/07/2023)  Utilities: Not At Risk (03/07/2023)  Social Connections: Moderately Isolated (03/07/2023)  Tobacco Use: Medium Risk (03/10/2023)    Readmission Risk Interventions    07/24/2022    2:49 PM  Readmission Risk Prevention Plan  Transportation Screening Complete  PCP or Specialist Appt within 5-7 Days Complete  Home Care Screening Complete  Medication Review (RN CM) Complete

## 2023-03-14 NOTE — Plan of Care (Signed)
  Problem: Health Behavior/Discharge Planning: Goal: Ability to manage health-related needs will improve Outcome: Progressing   Problem: Clinical Measurements: Goal: Will remain free from infection Outcome: Progressing Goal: Respiratory complications will improve Outcome: Progressing Goal: Cardiovascular complication will be avoided Outcome: Progressing   Problem: Nutrition: Goal: Adequate nutrition will be maintained Outcome: Progressing   Problem: Pain Managment: Goal: General experience of comfort will improve and/or be controlled Outcome: Progressing   Problem: Safety: Goal: Ability to remain free from injury will improve Outcome: Progressing

## 2023-03-14 NOTE — Progress Notes (Signed)
 [facility]                                                                              Stefania Goulart, is a 89 y.o. female, DOB - 01-14-1930, ZOX:[id number] Admit date - 03/07/2023    Outpatient Primary MD for the patient is [name], MD  LOS - 7  days  Chief Complaint  Patient presents with   Weakness       Brief summary   Patient is a 89 year old female with chronic diastolic CHF, PE/DVT status post IVC filter, HTN, HLP, COPD, CAD status post PCI of RCA, chronic respiratory failure with hypoxia on 4 L O2 via [place] at home with recent admission for CAP PNA, PE/LLE DVT status post IVC filter (02/07/2023-02/19/2023) presented with shortness of breath, noted to be hypoxic at SNF with O2 sats as low as 77%.  Patient was placed on 8 L O2 via [facility]NC in the ED. also noted to be hypotensive Chest x-ray showed small layering right pleural effusion, troponin 48-45, BNP 1390., -Admitted, started on diuretics, midodrine continued, cards following, limited by hypotension -Was evaluated by palliative care as well   Subjective:  -Feels fair, O2 tube was accidentally dislodged yesterday, denies dyspnea this morning  Assessment & Plan   Acute on chronic respiratory failure with hypoxia (HCC) -Multifactorial with acute on chronic diastolic CHF, COPD, pulmonary hypertension, recent multifocal pneumonia, recent segmental PE -Improving, down to 3-4L which is her baseline  Acute on chronic diastolic CHF, RV failure Moderate to severe pulmonary hypertension Coronary artery disease - 2D echo showed EF 50 to 55% with D-shaped IVS consistent with pressure and volume overload, severe RV dysfunction, moderate to severe pulmonary hypertension -Complicated by hypoalbuminemia (albumin 2.1) and third spacing  -Diuresed with IV Lasix, 7.4 L negative, weight down 13 LB -Was transitioned to oral torsemide, then held diuretics 3/5 with worsening hypotension and AKI -GDMT limited by hypotension,  continue midodrine, compression stockings -Increase activity, PT eval -Evaluated by palliative care this admission, remains DNR, wishes to "continue treating the treatable"  Fall, angle and knee pain -X-rays without acute findings, continue PT, supportive care    COPD (chronic obstructive pulmonary disease) (HCC) Recent multifocal pneumonia -Known COPD with smoking history -Monitor, no wheezing   History of recent pulmonary embolism, subsegmental PE, LLE DVT -During previous admission, status post IVC filter.  Was not able to tolerate anticoagulation, has history of recurrent diverticular bleeds  History of complete heart block -Status post PPM   Essential hypertension -Soft but stable, continue midodrine 10 mg 3 times daily  History of Crohn's disease, GERD -Continue Pentasa -Continue Protonix    Anemia of chronic disease -Hemoglobin 9.2, at baseline.  Hemoglobin was 8.8 on discharge on 02/14/2023, now stable/improving  Obesity class II Estimated body mass index is 31.31 kg/m as calculated from the following:   Height as of this encounter: 5\' 6"  (1.676 m).   Weight as of this encounter: 88 kg.  Goals of care -Multiple comorbid conditions, recurrent admissions seen by palliative care -Remains DNR, patient wishes to continue to treat the treatable and seek long-term placement at [facility]  Code Status: DNR DVT Prophylaxis: Lovenox Family Communication: No family at bedside, discussed with patient in detail, will update daughter Disposition Plan:   SNF when stable, hopefully 1 to 2 days   Procedures:  2D echo  Consultants:   Cardiology Palliative medicine  Antimicrobials:   Anti-infectives (From admission, onward)    None          Medications  aspirin EC  81 mg Oral Daily   atorvastatin  40 mg Oral QHS   cholecalciferol  5,000 Units Oral Daily   enoxaparin (LOVENOX) injection  40 mg Subcutaneous Q24H   ferrous sulfate  325 mg Oral Daily   lidocaine   1 patch Transdermal Daily   melatonin  3 mg Oral QHS   mesalamine  1,000 mg Oral BID   midodrine  10 mg Oral TID WC   mometasone-formoterol  2 puff Inhalation BID   montelukast  10 mg Oral Daily   pantoprazole  40 mg Oral Daily   polyethylene glycol  17 g Oral Daily   senna-docusate  1 tablet Oral BID   sodium chloride flush  3 mL Intravenous Q12H   torsemide  20 mg Oral Daily     Objective:   Vitals:   03/14/23 0005 03/14/23 0553 03/14/23 0700 03/14/23 0800  BP: 102/69 110/70 120/74   Pulse: 98 96 100 100  Resp: 16 20 18 20   Temp: 98 F (36.7 C) 98 F (36.7 C) 98.1 F (36.7 C) 97.9 F (36.6 C)  TempSrc: Oral Axillary Axillary Oral  SpO2: 95% 93% 93% 92%  Weight:  88 kg    Height:        Intake/Output Summary (Last 24 hours) at 03/14/2023 1134 Last data filed at 03/14/2023 0900 Gross per 24 hour  Intake 980 ml  Output 900 ml  Net 80 ml     Wt Readings from Last 3 Encounters:  03/14/23 88 kg  02/07/23 85.3 kg  12/28/22 85.3 kg    Obese chronically ill female sitting up in bed, AAOx3 HEENT: No JVD CVS: S1-S2, regular rhythm Lungs: Decreased breath sounds at the bases Abdomen: Soft, nontender, bowel sounds present Extremities: Trace edema, chronic skin changes Skin: no new rashes on exposed skin     Data Reviewed:  I have personally reviewed following labs    CBC Lab Results  Component Value Date   WBC 6.2 03/10/2023   RBC 3.39 (L) 03/10/2023   HGB 9.9 (L) 03/10/2023   HCT 31.7 (L) 03/10/2023   MCV 93.5 03/10/2023   MCH 29.2 03/10/2023   PLT 275 03/10/2023   MCHC 31.2 03/10/2023   RDW 19.9 (H) 03/10/2023   LYMPHSABS 0.9 03/10/2023   MONOABS 0.7 03/10/2023   EOSABS 0.1 03/10/2023   BASOSABS 0.0 03/10/2023     Last metabolic panel Lab Results  Component Value Date   NA 130 (L) 03/14/2023   K 4.0 03/14/2023   CL 93 (L) 03/14/2023   CO2 28 03/14/2023   BUN 42 (H) 03/14/2023   CREATININE 1.38 (H) 03/14/2023   GLUCOSE 98 03/14/2023    GFRNONAA 36 (L) 03/14/2023   GFRAA 44 (L) 05/21/2019   CALCIUM 7.4 (L) 03/14/2023   PHOS 3.5 02/19/2023   PROT 5.6 (L) 03/07/2023   ALBUMIN 2.2 (L) 03/13/2023   BILITOT 0.5 03/07/2023   ALKPHOS 54 03/07/2023   AST 19 03/07/2023   ALT 14 03/07/2023   ANIONGAP 9 03/14/2023    CBG (last 3)  No results for input(s): "GLUCAP" in  the last 72 hours.    Coagulation Profile: No results for input(s): "INR", "PROTIME" in the last 168 hours.   Radiology Studies: I have personally reviewed the imaging studies  No results found.       Zannie Cove M.D. [facility] 03/14/2023, 11:34 AM  Available via Epic secure chat 7am-7pm After 7 pm, please refer to night coverage provider listed on amion.

## 2023-03-14 NOTE — Plan of Care (Signed)

## 2023-03-14 NOTE — Progress Notes (Signed)
 Physical Therapy Treatment Patient Details Name: Felicia Benson MRN: [id number] DOB: 05-17-1930 Today's Date: 03/14/2023   History of Present Illness Pt is a 89 y/o female presenting 03/07/23 with a non-productive cough, leg swelling, and weakness. Admitted with exacerbation of CHF. Of note, pt was previously admitted 02/19/23 for CAP, PE, and DVT. PMHx AAA, CHF, Crohn's Disease, Diverticulitis, diverticulosis, GERD, GI bleed, HTN, OSA, pacemaker, thyroid nodule.    PT Comments  Pt continues to be anxious about OOB mobility due to R knee buckling. PT and Mobility Specialist provided increased encouragement during session. Pt is modA for bed mobility and modAx2 for step pivot transfer to recliner. Pt experiences 2x R knee buckling with stepping to recliner, but with cuing pt able to provide increased UE support to steady herself. Pt is making progress towards her goals. D/c plan remains appropriate at this time. PT will continue to follow acutely.     If plan is discharge home, recommend the following: A little help with walking and/or transfers;A little help with bathing/dressing/bathroom;Assistance with cooking/housework;Assist for transportation;Help with stairs or ramp for entrance   Can travel by private vehicle     Yes  Equipment Recommendations  None recommended by PT       Precautions / Restrictions Precautions Precautions: Fall Recall of Precautions/Restrictions: Intact Restrictions Weight Bearing Restrictions Per Provider Order: No     Mobility  Bed Mobility Overal bed mobility: Needs Assistance Bed Mobility: Supine to Sit     Supine to sit: HOB elevated, Used rails, Mod assist     General bed mobility comments: requires modA for advancement of RLE off bed, and min A for bringing trunk to upright,    Transfers Overall transfer level: Needs assistance Equipment used: Rolling walker (2 wheels) Transfers: Sit to/from Stand, Bed to chair/wheelchair/BSC Sit to Stand: From  elevated surface, Mod assist, +2 physical assistance   Step pivot transfers: Mod assist, +2 safety/equipment       General transfer comment: pt modAx2 for power up and steadying in RW, modAx2 for stepping from bed to recliner, pt  requiring encouragement for stepping to recliner, PT acknowledges pt has 2x R knee buckling but encourages pt to use increased UE support and not to panic. Vc for not sitting too quickly pt reports she still has increased fear of falling          Balance Overall balance assessment: Needs assistance Sitting-balance support: Feet supported, No upper extremity supported Sitting balance-Leahy Scale: Fair Sitting balance - Comments: Pt sits EOB without LOB Postural control:  (anterior lean) Standing balance support: Bilateral upper extremity supported, During functional activity, Reliant on assistive device for balance Standing balance-Leahy Scale: Poor Standing balance comment: Pt reliant on RW to stand.  Stands with increased trunk flexion                            Communication Communication Communication: No apparent difficulties  Cognition Arousal: Alert Behavior During Therapy: WFL for tasks assessed/performed   PT - Cognitive impairments: No apparent impairments                         Following commands: Intact      Cueing Cueing Techniques: Verbal cues     General Comments General comments (skin integrity, edema, etc.): HR in high 120s with transfer but quickly returns to 90s with sitting safely in recliner      Pertinent Vitals/Pain Pain  Assessment Pain Assessment: Faces Faces Pain Scale: Hurts even more Pain Location: R knee with movement Pain Descriptors / Indicators: Grimacing, Guarding, Moaning Pain Intervention(s): Limited activity within patient's tolerance, Monitored during session, Repositioned     PT Goals (current goals can now be found in the care plan section) Acute Rehab PT Goals Patient Stated  Goal: Return to SNF long term PT Goal Formulation: With patient Time For Goal Achievement: 03/22/23 Potential to Achieve Goals: Good Progress towards PT goals: Progressing toward goals    Frequency    Min 1X/week       AM-PAC PT "6 Clicks" Mobility   Outcome Measure  Help needed turning from your back to your side while in a flat bed without using bedrails?: A Lot Help needed moving from lying on your back to sitting on the side of a flat bed without using bedrails?: A Lot Help needed moving to and from a bed to a chair (including a wheelchair)?: A Lot Help needed standing up from a chair using your arms (e.g., wheelchair or bedside chair)?: A Lot Help needed to walk in hospital room?: Total Help needed climbing 3-5 steps with a railing? : Total 6 Click Score: 10    End of Session Equipment Utilized During Treatment: Gait belt;Oxygen Activity Tolerance: Patient tolerated treatment well Patient left: with call bell/phone within reach;in chair;with chair alarm set;Other (comment) (lift pad in recliner for lift back to bed if needed) Nurse Communication: Mobility status PT Visit Diagnosis: Unsteadiness on feet (R26.81);Other abnormalities of gait and mobility (R26.89);Muscle weakness (generalized) (M62.81);Difficulty in walking, not elsewhere classified (R26.2)     Time: 7829-5621 PT Time Calculation (min) (ACUTE ONLY): 20 min  Charges:    $Therapeutic Activity: 8-22 mins PT General Charges $$ ACUTE PT VISIT: 1 Visit                     Elizar Alpern B. Beverely Risen PT, DPT Acute Rehabilitation Services Please use secure chat or  Call Office [phone]    [facility] 03/14/2023, 3:08 PM

## 2023-03-15 DIAGNOSIS — I5033 Acute on chronic diastolic (congestive) heart failure: Secondary | ICD-10-CM | POA: Diagnosis not present

## 2023-03-15 DIAGNOSIS — Z7189 Other specified counseling: Secondary | ICD-10-CM | POA: Diagnosis not present

## 2023-03-15 DIAGNOSIS — Z66 Do not resuscitate: Secondary | ICD-10-CM

## 2023-03-15 DIAGNOSIS — J9621 Acute and chronic respiratory failure with hypoxia: Secondary | ICD-10-CM | POA: Diagnosis not present

## 2023-03-15 DIAGNOSIS — Z515 Encounter for palliative care: Secondary | ICD-10-CM | POA: Diagnosis not present

## 2023-03-15 DIAGNOSIS — I5043 Acute on chronic combined systolic (congestive) and diastolic (congestive) heart failure: Secondary | ICD-10-CM | POA: Diagnosis not present

## 2023-03-15 LAB — MAGNESIUM: Magnesium: 2 mg/dL (ref 1.7–2.4)

## 2023-03-15 LAB — BASIC METABOLIC PANEL
Anion gap: 14 (ref 5–15)
BUN: 40 mg/dL — ABNORMAL HIGH (ref 8–23)
CO2: 25 mmol/L (ref 22–32)
Calcium: 7.7 mg/dL — ABNORMAL LOW (ref 8.9–10.3)
Chloride: 93 mmol/L — ABNORMAL LOW (ref 98–111)
Creatinine, Ser: 1.3 mg/dL — ABNORMAL HIGH (ref 0.44–1.00)
GFR, Estimated: 39 mL/min — ABNORMAL LOW (ref >60)
Glucose, Bld: 94 mg/dL (ref 70–99)
Potassium: 3.9 mmol/L (ref 3.5–5.1)
Sodium: 132 mmol/L — ABNORMAL LOW (ref 135–145)

## 2023-03-15 MED ORDER — ORAL CARE MOUTH RINSE: 15.0000 mL | OROMUCOSAL | Status: DC | PRN

## 2023-03-15 MED ORDER — SENNOSIDES-DOCUSATE SODIUM 8.6-50 MG PO TABS
2.0000 | ORAL_TABLET | Freq: Two times a day (BID) | ORAL | Status: DC
  Filled 2023-03-15: qty 2

## 2023-03-15 NOTE — Plan of Care (Signed)
  Problem: Health Behavior/Discharge Planning: Goal: Ability to manage health-related needs will improve Outcome: Progressing   Problem: Clinical Measurements: Goal: Respiratory complications will improve Outcome: Progressing Goal: Cardiovascular complication will be avoided Outcome: Progressing   Problem: Nutrition: Goal: Adequate nutrition will be maintained Outcome: Progressing   Problem: Elimination: Goal: Will not experience complications related to bowel motility Outcome: Progressing Goal: Will not experience complications related to urinary retention Outcome: Progressing   Problem: Pain Managment: Goal: General experience of comfort will improve and/or be controlled Outcome: Progressing   Problem: Safety: Goal: Ability to remain free from injury will improve Outcome: Progressing

## 2023-03-15 NOTE — Progress Notes (Signed)
 [facility] [phone] [facility] Liaison note:       Notified by [facility] manager and palliative provider of patient/family request for [facility] at [place] after discharge.              Hospital liaison will follow patient for discharge disposition.       Please call with any hospice or outpatient palliative care related questions.         Thank you for the opportunity to participate in this patient's care. Thea Gist, BSN, RN hospital liaision [phone]

## 2023-03-15 NOTE — Plan of Care (Signed)

## 2023-03-15 NOTE — Progress Notes (Addendum)
 Patient Name: Felicia Benson Date of Encounter: 03/15/2023 [facility] Cardiologist: Chilton Si, MD   Interval Summary  .    Patient reports feeling much better with improved appetite and more stable BP. No dizziness. She expresses strong desire for more PT today. Her daughter will be by the hospital later and she hopes that her daughter can meet with social work/palliative care team.   Vital Signs .    Vitals:   03/15/23 0258 03/15/23 0300 03/15/23 0625 03/15/23 0725  BP: 117/75     Pulse:   89 96  Resp:   15 19  Temp: (!) 97.5 F (36.4 C) (!) 97.5 F (36.4 C)    TempSrc: Oral Oral    SpO2: 90% 90%    Weight:  88.6 kg    Height:        Intake/Output Summary (Last 24 hours) at 03/15/2023 0757 Last data filed at 03/15/2023 4098 Gross per 24 hour  Intake 1070 ml  Output 1250 ml  Net -180 ml      03/15/2023    3:00 AM 03/14/2023    5:53 AM 03/13/2023    6:07 AM  Last 3 Weights  Weight (lbs) 195 lb 4.8 oz 194 lb 0.1 oz 187 lb 9.8 oz  Weight (kg) 88.587 kg 88 kg 85.1 kg      Telemetry/ECG    V paced. Stable rates 90s to low 100s - Personally Reviewed  Physical Exam .   GEN: No acute distress.   Neck: No JVD Cardiac: RRR, no murmurs, rubs, or gallops.  Respiratory: bibasilar crackles, L>R GI: Soft, nontender, non-distended  MS: No edema  Assessment & Plan .     Acute on chronic diastolic CHF with RV failure Patient admitted with symptoms concerning for acute CHF. TTE showed LVEF 50-55% with D shaped septum and severely reduced RV function. Moderate TR also seen with RVSP 59.6mmHg. Small pericardial effusion noted. Albumin low on admission, 2.1, appears to be a chronic issue. Given mixed appearance of mild volume overload today with labs indicating small AKI, suspect that hypoalbuminemia is significant factor in edema. Recheck on 3/5 showed albumin 2.2. Stable creatinine, 1.38->1.30 today. Continue Torsemide 20mg  this morning with improved renal function.  GDMT otherwise limited by hypotension. May need every other day dosing at discharge. Continue midodrine 10mg  TID Encouraged ongoing conversations with palliative care.    Acute on chronic hypoxic respiratory failure with CHF, COPD, PE Patient with admission in January in the setting of multi-focal pneumonia, subsegmental PE/DVT (s/p IVC filter with no DOAC due to recent GI bleed).  Patient in no apparent respiratory difficulty this morning. On 4LPM (home flow is 3LPM). She continues to deny dyspnea, "I have never had shortness of breath this admission." Management per primary team   CAD Patient with minimally elevated and flat troponin 48->45. No chest pain, consistent with demand ischemia.   CHB s/p PPM Patient with stable device function, V pacing. Rates stable.   PE/DVT Found with subsegmental PE last month. Had IVC filter placed due to inability to start DOAC with recent GI bleed.    S/P Fall Patient with fall this admission, continues to have some right knee pain and is not able to bear much weight on this leg.   [facility] will sign off.   Medication Recommendations: Torsemide 20 mg daily.  Continue midodrine Other recommendations (labs, testing, etc): BMET in 1 week Follow up as an outpatient: We will schedule   For  questions or updates, please contact [facility] Please consult [email or web address] for contact info under      Signed, Perlie Gold, PA-C   Patient seen and examined.  Agree with above documentation.  On exam, patient is alert and oriented, regular rate and rhythm, no murmurs, lungs CTAB, mild  LE edema, no JVD.  BP 117/75.  Creatinine stable at 1.3.  Would continue p.o. torsemide 20 mg daily.  Cardiology will sign off at this time, please call if any questions arise  Little Ishikawa, MD

## 2023-03-15 NOTE — Progress Notes (Signed)
 Daily Progress Note   Patient Name: Felicia Benson       Date: 03/15/2023 DOB: [birth year]  Age: 89 y.o. MRN#: [id number] Attending Physician: [name], DO Primary Care Physician: [name], MD Admit Date: 03/07/2023  Reason for Consultation/Follow-up: Establishing goals of care  Subjective: Feels like she is improving  Length of Stay: 8  Current Medications: Scheduled Meds:   aspirin EC  81 mg Oral Daily   atorvastatin  40 mg Oral QHS   cholecalciferol  5,000 Units Oral Daily   enoxaparin (LOVENOX) injection  40 mg Subcutaneous Q24H   ferrous sulfate  325 mg Oral Daily   lidocaine  1 patch Transdermal Daily   melatonin  3 mg Oral QHS   mesalamine  1,000 mg Oral BID   midodrine  10 mg Oral TID WC   mometasone-formoterol  2 puff Inhalation BID   montelukast  10 mg Oral Daily   pantoprazole  40 mg Oral Daily   polyethylene glycol  17 g Oral Daily   senna-docusate  2 tablet Oral BID   sodium chloride flush  3 mL Intravenous Q12H   torsemide  20 mg Oral Daily    Continuous Infusions:   PRN Meds: acetaminophen, albuterol, ondansetron (ZOFRAN) IV, mouth rinse, oxyCODONE-acetaminophen, polyethylene glycol, sodium chloride flush  Physical Exam Constitutional:      General: She is not in acute distress.    Appearance: She is ill-appearing.  Pulmonary:     Effort: Pulmonary effort is normal.  Skin:    General: Skin is warm and dry.  Neurological:     Mental Status: She is alert and oriented to person, place, and time.  Psychiatric:        Mood and Affect: Mood normal.        Behavior: Behavior normal.             Vital Signs: BP 103/75 (BP Location: Left Arm)   Pulse (!) 104   Temp 98.2 F (36.8 C) (Oral)   Resp 17   Ht 5\' 6"  (1.676 m)   Wt 88.6 kg   SpO2 92%   BMI  31.52 kg/m  SpO2: SpO2: 92 % O2 Device: O2 Device: Nasal Cannula O2 Flow Rate: O2 Flow Rate (L/min): 3 L/min  Intake/output summary:  Intake/Output Summary (Last 24 hours) at 03/15/2023 1442 Last data filed at 03/15/2023 1416 Gross per 24 hour  Intake 810 ml  Output 1300 ml  Net -490 ml   LBM: Last BM Date : 03/14/23 (small amount per pt) Baseline Weight: Weight: 90.3 kg Most recent weight: Weight: 88.6 kg       Palliative Assessment/Data: PPS 50%      Patient Active Problem List   Diagnosis Date Noted   AKI (acute kidney injury) (HCC) 03/13/2023   Acute on chronic respiratory failure with hypoxia (HCC) 03/08/2023   Elevated troponin 03/08/2023   Acute exacerbation of CHF (congestive heart failure) (HCC) 03/07/2023   Right ventricular failure (HCC) 02/15/2023   Shortness of breath 02/13/2023   DVT (deep venous thrombosis) (HCC) 02/11/2023   Acute pulmonary embolism (HCC) 02/08/2023   Acute respiratory failure with hypoxia (HCC) 02/08/2023   CAP (community acquired pneumonia)  02/07/2023   Filling defect on imaging study 02/07/2023   CKD (chronic kidney disease) 02/07/2023   Pneumonia 02/07/2023   Diverticular hemorrhage 12/28/2022   Ileus, postoperative (HCC) 11/04/2022   Closed right hip fracture (HCC) 10/28/2022   Fall at home, initial encounter 10/28/2022   History of COPD 10/28/2022   Allergic rhinitis 10/28/2022   Anemia of chronic disease 10/28/2022   Chronic rhinitis 08/06/2022   OSA (obstructive sleep apnea) 08/06/2022   Heart failure, acute diastolic (HCC) 07/21/2022   CKD stage 3a, GFR 45-59 ml/min (HCC) 07/21/2022   Thoracic aortic aneurysm (HCC) 07/21/2022   GERD (gastroesophageal reflux disease) 07/21/2022   Pancreatic lesion 07/21/2022   Macular degeneration 07/21/2022   Hypocalcemia 07/21/2022   Insomnia 07/21/2022   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   TIA (transient ischemic attack) 12/31/2020   Acute on chronic diastolic heart failure  (HCC) 16/10/9602   Acute CHF (congestive heart failure) (HCC) 05/18/2019   Chronic hypoxic respiratory failure (HCC) 05/18/2019   COPD (chronic obstructive pulmonary disease) (HCC) 05/18/2019   History of cardiac pacemaker 05/18/2019   AAA (abdominal aortic aneurysm) 05/18/2019   Hyperlipidemia 05/18/2019   Abdominal pain in female 05/30/2015   Essential hypertension 05/30/2015   Crohn's disease (HCC) 05/30/2015   Gastrointestinal hemorrhage with melena 05/30/2015   Blood loss anemia 05/30/2015   Hyponatremia 05/30/2015   Kidney disease 05/30/2015   Normocytic anemia 05/30/2015   CAD S/P percutaneous coronary angioplasty 05/30/2015   Acute blood loss anemia 05/30/2015   Heme + stool     Palliative Care Assessment & Plan   HPI: 89 y.o. female  with past medical history of recent multifocal pneumonia, recent segmental PE, chronic respiratory failure/COPD on 4 L nasal cannula at baseline, chronic diastolic congestive heart failure, HTN, HLD, anemia of chronic medical disease  admitted on 03/07/2023 with acute on chronic respiratory failure with hypoxia, acute on chronic diastolic heart failure with moderate to severe pulmonary hypertension and CAD with recent history of PE.   Assessment: Improving. Patient requested follow up to discuss her discharge plan. She tells me her plan is to go to [place] and complete rehab then convert to LTC at [facility]. She wonders if hospice support would be beneficial. We review hospice philosophy of care and type of support provided. She has some specific questions about which meds will be continued - referred her to hospice team for more specific questions of her care plan. We discuss how her care would differ with support of hospice vs without hospice. She expresses understanding and would like to move forward with hospice referral. She requests I speak with her daughter as well. Reviewed all of the above with daughter as well. All questions and concerns  addressed. Daughter also agrees to plan. Provided contact info for local hospice agency in case they have questions for them in the interim. Discussed we would have palliative care follow along while she completes her rehab.   Recommendations/Plan: Patient plans to dc to rehab Would like support of hospice once she converts to LTC Will request OP PC to follow while under rehab and assist with transition to hospice  Code Status: DNR  Discharge Planning: Skilled Nursing Facility for rehab with Palliative care service follow-up  Care plan was discussed with patient and her daughter  Thank you for allowing the Palliative Medicine Team to assist in the care of this patient.   Total Time 60 minutes Prolonged Time Billed  no   Time spent includes: Detailed review of medical  records (labs, imaging, vital signs), medically appropriate exam, discussion with treatment team, counseling and educating patient, family and/or staff, documenting clinical information, medication management and coordination of care.     *Please note that this is a verbal dictation therefore any spelling or grammatical errors are due to the "Dragon [employer] One" system interpretation.  Gerlean Ren, DNP, [facility] Palliative Medicine Team Team Phone # [phone]  Pager [phone]

## 2023-03-15 NOTE — TOC Progression Note (Signed)
 Transition of Care [facility]) - Progression Note    Patient Details  Name: Felicia Benson MRN: [id number] Date of Birth: [birth year]  [facility] CM/SW Contact  Delilah Shan, LCSWA Phone Number: 03/15/2023, 3:08 PM  Clinical Narrative:     Patient has SNF bed at [facility] with palliative services to follow when medically ready for dc. CSW updated patients daughter Alvis Lemmings. CSW will continue to follow and assist with patients dc planning needs.  Expected Discharge Plan: Skilled Nursing Facility Barriers to Discharge: Continued Medical Work up  Expected Discharge Plan and Services In-house Referral: Clinical Social Work     Living arrangements for the past 2 months: Skilled Nursing Facility                                       Social Determinants of Health (SDOH) Interventions SDOH Screenings   Food Insecurity: No Food Insecurity (03/07/2023)  Housing: Low Risk  (03/07/2023)  Recent Concern: Housing - High Risk (12/28/2022)  Transportation Needs: No Transportation Needs (03/07/2023)  Utilities: Not At Risk (03/07/2023)  Social Connections: Moderately Isolated (03/07/2023)  Tobacco Use: Medium Risk (03/10/2023)    Readmission Risk Interventions    07/24/2022    2:49 PM  Readmission Risk Prevention Plan  Transportation Screening Complete  PCP or Specialist Appt within 5-7 Days Complete  Home Care Screening Complete  Medication Review (RN CM) Complete

## 2023-03-15 NOTE — Progress Notes (Signed)
 PROGRESS NOTE    Felicia Benson  VWU:[id number] DOB: 14-Jan-1930 DOA: 03/07/2023 PCP: [name], MD     Brief Narrative:  Felicia Benson is a 89 year old female with chronic diastolic CHF, PE/DVT status post IVC filter, HTN, HLP, COPD, CAD status post PCI of RCA, chronic respiratory failure with hypoxia on 4 L O2 via [place] at home with recent admission for CAP PNA, PE/LLE DVT status post IVC filter (02/07/2023-02/19/2023) presented with shortness of breath, noted to be hypoxic at SNF with O2 sats as low as 77%.  Patient was placed on 8 L O2 via HFNC in the ED. also noted to be hypotensive.  Chest x-ray showed small layering right pleural effusion, troponin 48-45, BNP 1390., -Admitted, started on diuretics, midodrine continued, cards following, limited by hypotension -Palliative care consulted  New events last 24 hours / Subjective: Patient was requesting to speak with palliative care medicine team.  She had questions regarding palliative care medicine versus hospice versus short-term nursing facility versus long-term nursing facility placement.  Stated that her daughter was driving up from [place] this afternoon.  Physically, patient was doing well, denied any shortness of breath.  She asked for additional dose of Senokot  Assessment & Plan:   Principal Problem:   Acute on chronic respiratory failure with hypoxia (HCC) Active Problems:   COPD (chronic obstructive pulmonary disease) (HCC)   Acute on chronic diastolic heart failure (HCC)   Essential hypertension   CAD S/P percutaneous coronary angioplasty   GERD (gastroesophageal reflux disease)   Anemia of chronic disease   CKD (chronic kidney disease)   Acute exacerbation of CHF (congestive heart failure) (HCC)   Elevated troponin   AKI (acute kidney injury) (HCC)   Acute on chronic hypoxemic respiratory failure -Multifactorial, acute on chronic CHF, COPD, pulmonary hypertension, recent pneumonia, recent PE -Currently on 3 L  oxygen, uses 3 to 4 L at baseline  Acute on chronic diastolic CHF, RV failure, moderate to severe pulmonary hypertension -EF 50 to 55% -Appreciate cardiology; follow-up with outpatient cardiology on discharge with repeat BMP in 1 week -Diuresed with IV Lasix, transition to p.o. torsemide daily  -Palliative care consulted  CKD stage IIIb -Baseline creatinine around 1.1  -Creatinine stable at 1.3 today  Fall with ankle and knee pain -X-ray without acute findings  Recent history of PE, DVT -Status post IVC filter -Unable to tolerate oral anticoagulation due to history of recurrent diverticular bleed  History of complete heart block -Status post PPM  Hypertension complicated by hypotension -On midodrine  Hyperlipidemia -Lipitor  History of Crohn's disease -Pentasa  GERD -PPI  Anemia of chronic disease -Stable  Obesity -Estimated body mass index is 31.52 kg/m as calculated from the following:   Height as of this encounter: 5\' 6"  (1.676 m).   Weight as of this encounter: 88.6 kg.   DVT prophylaxis:  Place TED hose Start: 03/10/23 0906 enoxaparin (LOVENOX) injection 40 mg Start: 03/07/23 2200  Code Status: DNR Family Communication: None at bedside Disposition Plan: [facility] SN[facility]  Status is: Inpatient Remains inpatient appropriate because: Palliative care consulted    Antimicrobials:  Anti-infectives (From admission, onward)    None        Objective: Vitals:   03/15/23 0625 03/15/23 0725 03/15/23 0859 03/15/23 1415  BP:    103/75  Pulse: 89 96  (!) 104  Resp: 15 19  17   Temp:    98.2 F (36.8 C)  TempSrc:    Oral  SpO2:  92% 92%  Weight:      Height:        Intake/Output Summary (Last 24 hours) at 03/15/2023 1417 Last data filed at 03/15/2023 1416 Gross per 24 hour  Intake 810 ml  Output 1000 ml  Net -190 ml   Filed Weights   03/13/23 0607 03/14/23 0553 03/15/23 0300  Weight: 85.1 kg 88 kg 88.6 kg    Examination:  General exam:  Appears calm and comfortable  Respiratory system: Respiratory effort normal. No respiratory distress. No conversational dyspnea.  Cardiovascular system: S1 & S2 heard. No murmurs. No pedal edema. Gastrointestinal system: Abdomen is nondistended, soft and nontender. Normal bowel sounds heard. Central nervous system: Alert and oriented. No focal neurological deficits. Speech clear.  Extremities: Symmetric in appearance  Skin: No rashes, lesions or ulcers on exposed skin  Psychiatry: Judgement and insight appear normal. Mood & affect appropriate.   Data Reviewed: I have personally reviewed following labs and imaging studies  CBC: Recent Labs  Lab 03/09/23 0420 03/10/23 0342  WBC 6.3 6.2  NEUTROABS 4.5 4.5  HGB 9.9* 9.9*  HCT 31.8* 31.7*  MCV 93.5 93.5  PLT 266 275   Basic Metabolic Panel: Recent Labs  Lab 03/10/23 0342 03/11/23 0457 03/12/23 0606 03/13/23 0433 03/14/23 0430 03/15/23 0423  NA 137 135 135 133* 130* 132*  K 4.0 4.6 4.6 4.5 4.0 3.9  CL 95* 94* 96* 90* 93* 93*  CO2 31 33* 28 28 28 25   GLUCOSE 95 92 93 110* 98 94  BUN 27* 29* 30* 38* 42* 40*  CREATININE 1.15* 1.19* 1.32* 1.60* 1.38* 1.30*  CALCIUM 8.2* 8.2* 8.2* 8.1* 7.4* 7.7*  MG 1.8 2.2  --   --   --  2.0   GFR: Estimated Creatinine Clearance: 32.2 mL/min (A) (by C-G formula based on SCr of 1.3 mg/dL (H)). Liver Function Tests: Recent Labs  Lab 03/13/23 0429  ALBUMIN 2.2*   No results for input(s): "LIPASE", "AMYLASE" in the last 168 hours. No results for input(s): "AMMONIA" in the last 168 hours. Coagulation Profile: No results for input(s): "INR", "PROTIME" in the last 168 hours. Cardiac Enzymes: No results for input(s): "CKTOTAL", "CKMB", "CKMBINDEX", "TROPONINI" in the last 168 hours. BNP (last 3 results) No results for input(s): "PROBNP" in the last 8760 hours. HbA1C: No results for input(s): "HGBA1C" in the last 72 hours. CBG: No results for input(s): "GLUCAP" in the last 168 hours. Lipid  Profile: No results for input(s): "CHOL", "HDL", "LDLCALC", "TRIG", "CHOLHDL", "LDLDIRECT" in the last 72 hours. Thyroid Function Tests: No results for input(s): "TSH", "T4TOTAL", "FREET4", "T3FREE", "THYROIDAB" in the last 72 hours. Anemia Panel: No results for input(s): "VITAMINB12", "FOLATE", "FERRITIN", "TIBC", "IRON", "RETICCTPCT" in the last 72 hours. Sepsis Labs: Recent Labs  Lab 03/13/23 0433  LATICACIDVEN 1.5    Recent Results (from the past 240 hours)  Resp panel by RT-PCR (RSV, Flu A&B, Covid) Anterior Nasal Swab     Status: None   Collection Time: 03/07/23 11:27 AM   Specimen: Anterior Nasal Swab  Result Value Ref Range Status   SARS Coronavirus 2 by RT PCR NEGATIVE NEGATIVE Final   Influenza A by PCR NEGATIVE NEGATIVE Final   Influenza B by PCR NEGATIVE NEGATIVE Final    Comment: (NOTE) The Xpert Xpress SARS-CoV-2/FLU/RSV plus assay is intended as an aid in the diagnosis of influenza from Nasopharyngeal swab specimens and should not be used as a sole basis for treatment. Nasal washings and aspirates are unacceptable for Xpert  Xpress SARS-CoV-2/FLU/RSV testing.  Fact Sheet for Patients: BloggerCourse.com  Fact Sheet for Healthcare Providers: SeriousBroker.it  This test is not yet approved or cleared by the [place] FDA and has been authorized for detection and/or diagnosis of SARS-CoV-2 by FDA under an Emergency Use Authorization (EUA). This EUA will remain in effect (meaning this test can be used) for the duration of the COVID-19 declaration under Section 564(b)(1) of the Act, 21 U.S.C. section 360bbb-3(b)(1), unless the authorization is terminated or revoked.     Resp Syncytial Virus by PCR NEGATIVE NEGATIVE Final    Comment: (NOTE) Fact Sheet for Patients: BloggerCourse.com  Fact Sheet for Healthcare Providers: SeriousBroker.it  This test is not yet  approved or cleared by the [place] FDA and has been authorized for detection and/or diagnosis of SARS-CoV-2 by FDA under an Emergency Use Authorization (EUA). This EUA will remain in effect (meaning this test can be used) for the duration of the COVID-19 declaration under Section 564(b)(1) of the Act, 21 U.S.C. section 360bbb-3(b)(1), unless the authorization is terminated or revoked.  Performed at [facility], [address], [place], [place] [address]       Radiology Studies: No results found.    Scheduled Meds:  aspirin EC  81 mg Oral Daily   atorvastatin  40 mg Oral QHS   cholecalciferol  5,000 Units Oral Daily   enoxaparin (LOVENOX) injection  40 mg Subcutaneous Q24H   ferrous sulfate  325 mg Oral Daily   lidocaine  1 patch Transdermal Daily   melatonin  3 mg Oral QHS   mesalamine  1,000 mg Oral BID   midodrine  10 mg Oral TID WC   mometasone-formoterol  2 puff Inhalation BID   montelukast  10 mg Oral Daily   pantoprazole  40 mg Oral Daily   polyethylene glycol  17 g Oral Daily   senna-docusate  1 tablet Oral BID   sodium chloride flush  3 mL Intravenous Q12H   torsemide  20 mg Oral Daily   Continuous Infusions:   LOS: 8 days   Time spent: 35 minutes   [name], DO [facility] 03/15/2023, 2:17 PM   Available via Epic secure chat 7am-7pm After these hours, please refer to coverage provider listed on [email or web address]

## 2023-03-16 DIAGNOSIS — J9621 Acute and chronic respiratory failure with hypoxia: Secondary | ICD-10-CM | POA: Diagnosis not present

## 2023-03-16 LAB — BASIC METABOLIC PANEL
Anion gap: 7 (ref 5–15)
BUN: 45 mg/dL — ABNORMAL HIGH (ref 8–23)
CO2: 29 mmol/L (ref 22–32)
Calcium: 7.6 mg/dL — ABNORMAL LOW (ref 8.9–10.3)
Chloride: 96 mmol/L — ABNORMAL LOW (ref 98–111)
Creatinine, Ser: 1.28 mg/dL — ABNORMAL HIGH (ref 0.44–1.00)
GFR, Estimated: 40 mL/min — ABNORMAL LOW (ref >60)
Glucose, Bld: 110 mg/dL — ABNORMAL HIGH (ref 70–99)
Potassium: 4.5 mmol/L (ref 3.5–5.1)
Sodium: 132 mmol/L — ABNORMAL LOW (ref 135–145)

## 2023-03-16 MED ORDER — SENNOSIDES-DOCUSATE SODIUM 8.6-50 MG PO TABS
2.0000 | ORAL_TABLET | Freq: Two times a day (BID) | ORAL | Status: DC | PRN
  Administered 2023-03-16 – 2023-03-17 (×2): 1 via ORAL
  Filled 2023-03-16: qty 2

## 2023-03-16 MED ORDER — POLYETHYLENE GLYCOL 3350 17 G PO PACK: 17.0000 g | PACK | Freq: Every day | ORAL | Status: DC | PRN

## 2023-03-16 NOTE — Progress Notes (Signed)
   03/16/23 2026  BiPAP/CPAP/SIPAP  Reason BIPAP/CPAP not in use Non-compliant  BiPAP/CPAP /SiPAP Vitals  Resp 18  BP (!) 99/8  SpO2 95 %  Bilateral Breath Sounds Clear;Diminished  MEWS Score/Color  MEWS Score 1  MEWS Score Color [place] Si

## 2023-03-16 NOTE — Plan of Care (Signed)
  Problem: Health Behavior/Discharge Planning: Goal: Ability to manage health-related needs will improve Outcome: Progressing   Problem: Clinical Measurements: Goal: Respiratory complications will improve Outcome: Progressing Goal: Cardiovascular complication will be avoided Outcome: Progressing   Problem: Nutrition: Goal: Adequate nutrition will be maintained Outcome: Progressing   Problem: Elimination: Goal: Will not experience complications related to bowel motility Outcome: Progressing Goal: Will not experience complications related to urinary retention Outcome: Progressing   Problem: Pain Managment: Goal: General experience of comfort will improve and/or be controlled Outcome: Progressing   Problem: Safety: Goal: Ability to remain free from injury will improve Outcome: Progressing

## 2023-03-16 NOTE — Progress Notes (Addendum)
 PROGRESS NOTE    Felicia Benson  ZYS:[id number] DOB: Sep 12, 1930 DOA: 03/07/2023 PCP: [name], MD     Brief Narrative:  Felicia Benson is a 89 year old female with chronic diastolic CHF, PE/DVT status post IVC filter, HTN, HLP, COPD, CAD status post PCI of RCA, chronic respiratory failure with hypoxia on 4 L O2 via [place] at home with recent admission for CAP PNA, PE/LLE DVT status post IVC filter (02/07/2023-02/19/2023) presented with shortness of breath, noted to be hypoxic at SNF with O2 sats as low as 77%.  Patient was placed on 8 L O2 via HFNC in the ED. also noted to be hypotensive.  Chest x-ray showed small layering right pleural effusion, troponin 48-45, BNP 1390., -Admitted, started on diuretics, midodrine continued, cards following, limited by hypotension -Palliative care consulted  New events last 24 hours / Subjective: Patient had 4-5 bowel movements yesterday.  Does not feel that she can go to skilled nursing facility today.  She wants to be monitored for 1 more day.  Plan is to discharge to [facility] on discharge, and then she will eventually transition to long-term nursing facility with hospice following.  Assessment & Plan:   Principal Problem:   Acute on chronic respiratory failure with hypoxia (HCC) Active Problems:   COPD (chronic obstructive pulmonary disease) (HCC)   Acute on chronic diastolic heart failure (HCC)   Essential hypertension   CAD S/P percutaneous coronary angioplasty   GERD (gastroesophageal reflux disease)   Anemia of chronic disease   CKD (chronic kidney disease)   Acute exacerbation of CHF (congestive heart failure) (HCC)   Elevated troponin   AKI (acute kidney injury) (HCC)   Acute on chronic hypoxemic respiratory failure -Multifactorial, acute on chronic CHF, COPD, pulmonary hypertension, recent pneumonia, recent PE -Currently on 4 L oxygen, uses 3 to 4 L at baseline  Acute on chronic diastolic CHF, RV failure, moderate to severe  pulmonary hypertension -EF 50 to 55% -Appreciate cardiology; follow-up with outpatient cardiology on discharge with repeat BMP in 1 week -Diuresed with IV Lasix, transitioned to p.o. torsemide daily  -Palliative care consulted  AKI on CKD stage IIIb -Baseline creatinine around 1.1 --> peak at 1.6  -Creatinine stable at 1.28 today  Fall with ankle and knee pain -X-ray without acute findings  Recent history of PE, DVT -Status post IVC filter -Unable to tolerate oral anticoagulation due to history of recurrent diverticular bleed  History of complete heart block -Status post PPM  Hypertension complicated by hypotension -On midodrine  Hyperlipidemia -Lipitor  History of Crohn's disease -Pentasa  GERD -PPI  Anemia of chronic disease -Stable  Obesity -Estimated body mass index is 31.21 kg/m as calculated from the following:   Height as of this encounter: 5\' 6"  (1.676 m).   Weight as of this encounter: 87.7 kg.   DVT prophylaxis:  Place TED hose Start: 03/10/23 0906 enoxaparin (LOVENOX) injection 40 mg Start: 03/07/23 2200  Code Status: DNR Family Communication: None at bedside Disposition Plan: [facility] SN[facility]  Status is: Inpatient Remains inpatient appropriate because: Plan for discharge to [facility] tomorrow    Antimicrobials:  Anti-infectives (From admission, onward)    None        Objective: Vitals:   03/16/23 0900 03/16/23 1000 03/16/23 1100 03/16/23 1119  BP:    121/74  Pulse: (!) 104 (!) 103 (!) 104 98  Resp: 18 19 16  (!) 22  Temp:    97.8 F (36.6 C)  TempSrc:  Oral  SpO2:    (!) 9%  Weight:      Height:        Intake/Output Summary (Last 24 hours) at 03/16/2023 1131 Last data filed at 03/16/2023 1124 Gross per 24 hour  Intake 510 ml  Output 1000 ml  Net -490 ml   Filed Weights   03/14/23 0553 03/15/23 0300 03/16/23 0635  Weight: 88 kg 88.6 kg 87.7 kg    Examination:  General exam: Appears calm and comfortable  Respiratory  system: Respiratory effort normal. No respiratory distress. No conversational dyspnea.  Cardiovascular system: S1 & S2 heard.  Tachycardic.  No murmurs. No pedal edema. Gastrointestinal system: Abdomen is nondistended, soft and nontender. Normal bowel sounds heard. Central nervous system: Alert and oriented. No focal neurological deficits. Speech clear.  Extremities: Symmetric in appearance  Skin: No rashes, lesions or ulcers on exposed skin  Psychiatry: Judgement and insight appear normal. Mood & affect appropriate.   Data Reviewed: I have personally reviewed following labs and imaging studies  CBC: Recent Labs  Lab 03/10/23 0342  WBC 6.2  NEUTROABS 4.5  HGB 9.9*  HCT 31.7*  MCV 93.5  PLT 275   Basic Metabolic Panel: Recent Labs  Lab 03/10/23 0342 03/11/23 0457 03/12/23 0606 03/13/23 0433 03/14/23 0430 03/15/23 0423 03/16/23 0437  NA 137 135 135 133* 130* 132* 132*  K 4.0 4.6 4.6 4.5 4.0 3.9 4.5  CL 95* 94* 96* 90* 93* 93* 96*  CO2 31 33* 28 28 28 25 29   GLUCOSE 95 92 93 110* 98 94 110*  BUN 27* 29* 30* 38* 42* 40* 45*  CREATININE 1.15* 1.19* 1.32* 1.60* 1.38* 1.30* 1.28*  CALCIUM 8.2* 8.2* 8.2* 8.1* 7.4* 7.7* 7.6*  MG 1.8 2.2  --   --   --  2.0  --    GFR: Estimated Creatinine Clearance: 32.6 mL/min (A) (by C-G formula based on SCr of 1.28 mg/dL (H)). Liver Function Tests: Recent Labs  Lab 03/13/23 0429  ALBUMIN 2.2*   No results for input(s): "LIPASE", "AMYLASE" in the last 168 hours. No results for input(s): "AMMONIA" in the last 168 hours. Coagulation Profile: No results for input(s): "INR", "PROTIME" in the last 168 hours. Cardiac Enzymes: No results for input(s): "CKTOTAL", "CKMB", "CKMBINDEX", "TROPONINI" in the last 168 hours. BNP (last 3 results) No results for input(s): "PROBNP" in the last 8760 hours. HbA1C: No results for input(s): "HGBA1C" in the last 72 hours. CBG: No results for input(s): "GLUCAP" in the last 168 hours. Lipid Profile: No  results for input(s): "CHOL", "HDL", "LDLCALC", "TRIG", "CHOLHDL", "LDLDIRECT" in the last 72 hours. Thyroid Function Tests: No results for input(s): "TSH", "T4TOTAL", "FREET4", "T3FREE", "THYROIDAB" in the last 72 hours. Anemia Panel: No results for input(s): "VITAMINB12", "FOLATE", "FERRITIN", "TIBC", "IRON", "RETICCTPCT" in the last 72 hours. Sepsis Labs: Recent Labs  Lab 03/13/23 0433  LATICACIDVEN 1.5    Recent Results (from the past 240 hours)  Resp panel by RT-PCR (RSV, Flu A&B, Covid) Anterior Nasal Swab     Status: None   Collection Time: 03/07/23 11:27 AM   Specimen: Anterior Nasal Swab  Result Value Ref Range Status   SARS Coronavirus 2 by RT PCR NEGATIVE NEGATIVE Final   Influenza A by PCR NEGATIVE NEGATIVE Final   Influenza B by PCR NEGATIVE NEGATIVE Final    Comment: (NOTE) The Xpert Xpress SARS-CoV-2/FLU/RSV plus assay is intended as an aid in the diagnosis of influenza from Nasopharyngeal swab specimens and should not be used  as a sole basis for treatment. Nasal washings and aspirates are unacceptable for Xpert Xpress SARS-CoV-2/FLU/RSV testing.  Fact Sheet for Patients: BloggerCourse.com  Fact Sheet for Healthcare Providers: SeriousBroker.it  This test is not yet approved or cleared by the [place] FDA and has been authorized for detection and/or diagnosis of SARS-CoV-2 by FDA under an Emergency Use Authorization (EUA). This EUA will remain in effect (meaning this test can be used) for the duration of the COVID-19 declaration under Section 564(b)(1) of the Act, 21 U.S.C. section 360bbb-3(b)(1), unless the authorization is terminated or revoked.     Resp Syncytial Virus by PCR NEGATIVE NEGATIVE Final    Comment: (NOTE) Fact Sheet for Patients: BloggerCourse.com  Fact Sheet for Healthcare Providers: SeriousBroker.it  This test is not yet approved or  cleared by the [place] FDA and has been authorized for detection and/or diagnosis of SARS-CoV-2 by FDA under an Emergency Use Authorization (EUA). This EUA will remain in effect (meaning this test can be used) for the duration of the COVID-19 declaration under Section 564(b)(1) of the Act, 21 U.S.C. section 360bbb-3(b)(1), unless the authorization is terminated or revoked.  Performed at [facility], [address] [place], [place] [address]       Radiology Studies: No results found.    Scheduled Meds:  aspirin EC  81 mg Oral Daily   atorvastatin  40 mg Oral QHS   cholecalciferol  5,000 Units Oral Daily   enoxaparin (LOVENOX) injection  40 mg Subcutaneous Q24H   ferrous sulfate  325 mg Oral Daily   lidocaine  1 patch Transdermal Daily   melatonin  3 mg Oral QHS   mesalamine  1,000 mg Oral BID   midodrine  10 mg Oral TID WC   mometasone-formoterol  2 puff Inhalation BID   montelukast  10 mg Oral Daily   pantoprazole  40 mg Oral Daily   polyethylene glycol  17 g Oral Daily   senna-docusate  2 tablet Oral BID   sodium chloride flush  3 mL Intravenous Q12H   torsemide  20 mg Oral Daily   Continuous Infusions:   LOS: 9 days   Time spent: 25 minutes   [name], DO [facility] 03/16/2023, 11:31 AM   Available via Epic secure chat 7am-7pm After these hours, please refer to coverage provider listed on [email or web address]

## 2023-03-16 NOTE — Plan of Care (Signed)
   Problem: Education: Goal: Knowledge of General Education information will improve Description: Including pain rating scale, medication(s)/side effects and non-pharmacologic comfort measures Outcome: Progressing   Problem: Clinical Measurements: Goal: Ability to maintain clinical measurements within normal limits will improve Outcome: Progressing Goal: Will remain free from infection Outcome: Progressing Goal: Diagnostic test results will improve Outcome: Progressing Goal: Respiratory complications will improve Outcome: Progressing Goal: Cardiovascular complication will be avoided Outcome: Progressing   Problem: Activity: Goal: Risk for activity intolerance will decrease Outcome: Not Progressing   Problem: Nutrition: Goal: Adequate nutrition will be maintained Outcome: Progressing

## 2023-03-17 ENCOUNTER — Inpatient Hospital Stay (HOSPITAL_COMMUNITY)

## 2023-03-17 DIAGNOSIS — J9621 Acute and chronic respiratory failure with hypoxia: Secondary | ICD-10-CM | POA: Diagnosis not present

## 2023-03-17 LAB — BASIC METABOLIC PANEL
Anion gap: 9 (ref 5–15)
BUN: 40 mg/dL — ABNORMAL HIGH (ref 8–23)
CO2: 32 mmol/L (ref 22–32)
Calcium: 8 mg/dL — ABNORMAL LOW (ref 8.9–10.3)
Chloride: 94 mmol/L — ABNORMAL LOW (ref 98–111)
Creatinine, Ser: 1.23 mg/dL — ABNORMAL HIGH (ref 0.44–1.00)
GFR, Estimated: 42 mL/min — ABNORMAL LOW (ref >60)
Glucose, Bld: 101 mg/dL — ABNORMAL HIGH (ref 70–99)
Potassium: 4.2 mmol/L (ref 3.5–5.1)
Sodium: 135 mmol/L (ref 135–145)

## 2023-03-17 MED ORDER — TORSEMIDE 20 MG PO TABS: 20.0000 mg | ORAL_TABLET | Freq: Every day | ORAL | Status: DC

## 2023-03-17 MED ORDER — OXYCODONE-ACETAMINOPHEN 5-325 MG PO TABS: 1.0000 | ORAL_TABLET | Freq: Three times a day (TID) | ORAL | 0 refills | Status: DC | PRN

## 2023-03-17 MED ORDER — MIDODRINE HCL 10 MG PO TABS: 10.0000 mg | ORAL_TABLET | Freq: Three times a day (TID) | ORAL | Status: DC

## 2023-03-17 NOTE — TOC Transition Note (Addendum)
 Transition of Care [facility]) - Discharge Note   Patient Details  Name: Felicia Benson MRN: [id number] Date of Birth: 1930-03-01  [facility] CM/SW Contact:  Inis Sizer, LCSW Phone Number: 03/17/2023, 11:41 AM   Clinical Narrative:    CSW spoke with Adella Nissen at [facility] who states patient can be accepted into the facility today.  Patient will go to room 207 at [facility]. Patient will travel via [place] - CSW called for pickup. The number to call for report is [phone]. Medical necessity completed and printed to unit.  CSW attempted to reach patient's daughter Alvis Lemmings without success - a voicemail was left requesting a return call.   Final next level of care: Skilled Nursing Facility Barriers to Discharge: Barriers Resolved   Patient Goals and CMS Choice Patient states their goals for this hospitalization and ongoing recovery are:: SNF   Choice offered to / list presented to : Patient      Discharge Placement                       Discharge Plan and Services Additional resources added to the After Visit Summary for   In-house Referral: Clinical Social Work                                   Social Drivers of Health (SDOH) Interventions SDOH Screenings   Food Insecurity: No Food Insecurity (03/07/2023)  Housing: Low Risk  (03/07/2023)  Recent Concern: Housing - High Risk (12/28/2022)  Transportation Needs: No Transportation Needs (03/07/2023)  Utilities: Not At Risk (03/07/2023)  Social Connections: Moderately Isolated (03/07/2023)  Tobacco Use: Medium Risk (03/10/2023)     Readmission Risk Interventions    07/24/2022    2:49 PM  Readmission Risk Prevention Plan  Transportation Screening Complete  PCP or Specialist Appt within 5-7 Days Complete  Home Care Screening Complete  Medication Review (RN CM) Complete

## 2023-03-17 NOTE — Progress Notes (Signed)
 Report called to [place], LPN at [facility].  All questions answered.  Pt. Will go to Room 207.  Awaiting [facility] transport

## 2023-03-17 NOTE — Discharge Summary (Signed)
 Physician Discharge Summary  Felicia Benson UJW:[id number] DOB: [birth year] DOA: 03/07/2023  PCP: [name], MD  Admit date: 03/07/2023 Discharge date: 03/17/2023 Recommendations for Outpatient Follow-up:  Follow up with PCP in 1 weeks-call for appointment Please obtain BMP/CBC in one week  Discharge Dispo: SNF Discharge Condition: Stable Code Status:   Code Status: Limited: Do not attempt resuscitation (DNR) -DNR-LIMITED -Do Not Intubate/DNI  Diet recommendation:  Diet Order             Diet Heart Room service appropriate? Yes; Fluid consistency: Thin; Fluid restriction: 1500 mL Fluid  Diet effective now                   Brief/Interim Summary: 89 year old female with chronic diastolic CHF, PE/DVT status post IVC filter, HTN, HLP, COPD, CAD status post PCI of RCA, chronic respiratory failure with hypoxia on 4 L O2 via  at home with recent admission for CAP PNA, PE/LLE DVT status post IVC filter (02/07/2023-02/19/2023) presented with hypoxia of 77% at the rehab and shortness of breath -ED 8 L HFNC and in the ED hypotensive chest x-ray showed small layering right pleural effusion, troponin 48-45, BNP 1390 and admitted for acute on chronic hypoxic respiratory failure acute on chronic diastolic CHF, renal failure and hypertension.  Seen by cardiology palliative care.  Diuresis limited by hypotension.  PT OT recommended skilled nursing facility, a SNF was pursued.  On 3/8 patient did not feel well enough for discharge to skilled nursing facility.  Was monitored overnight, afebrile vitally stable doing well on 4 L nasal cannula  Consultation: Cardiology: Advised to continue torsemide midodrine Palliative care: advised to continue to treat treatable but no CPR or intubation.    Discharge Diagnoses:  Principal Problem:   Acute on chronic respiratory failure with hypoxia (HCC) Active Problems:   COPD (chronic obstructive pulmonary disease) (HCC)   Acute on chronic diastolic heart failure  (HCC)   Essential hypertension   CAD S/P percutaneous coronary angioplasty   GERD (gastroesophageal reflux disease)   Anemia of chronic disease   CKD (chronic kidney disease)   Acute exacerbation of CHF (congestive heart failure) (HCC)   Elevated troponin   AKI (acute kidney injury) (HCC)  Acute on chronic diastolic CHF with RV failure: With low albumin level, echo with EF 50 to 60% D-shaped septum and severely reduced RV function moderate TR RVSP 59.6 mmHg.  Seen by cardiology meds adjusted with her CKD continue current torsemide 20, monitor volume status continue midodrine for pressure support and strongly advised conservative care with palliative care follow-up.  Acute on chronic hypoxic respiratory failure: Multifactorial due to CHF COPD and PE.  Continue supplemental oxygen, PTA on 3-4l- currently on 4.   CAD: Flat troponin likely demand ischemia from CHF.  No chest pain.  Cardiology evaluated.  CHB status post PPM stable\  PE/DVT last month S/P IVC filter: Not a candidate for anticoagulation due to recent GI bleeding  Deconditioning/debility, s/p fall: Seen by PT OT recommending skilled nursing facility.  Having right knee pain continue pain control  CKD stage IIIb: Previous baseline around 1.1 currently holding at 1.3.  Monitor closely on diuretics  Hypotension: Continue midodrine  HLD: Continue Lipitor  History of chron's disease GERD: Continue her Pentasa, PPI  Anemia of chronic kidney disease: Hemoglobin stable.  Subjective: Alert awake oriented, requesting x-ray of right knee before discharge which was unremarkable and stable Pending labs before discharge  Discharge Exam: Vitals:   03/17/23 1100 03/17/23 1122  BP:  (!) 117/7  Pulse: 94 95  Resp: (!) 22 20  Temp:  (!) 97.5 F (36.4 C)  SpO2:  93%   General: Pt is alert, awake, not in acute distress Cardiovascular: RRR, S1/S2 +, no rubs, no gallops Respiratory: CTA bilaterally, no wheezing, no  rhonchi Abdominal: Soft, NT, ND, bowel sounds + Extremities: no edema, no cyanosis  Discharge Instructions  Discharge Instructions     Discharge instructions   Complete by: As directed    Please call call MD or return to ER for similar or worsening recurring problem that brought you to hospital or if any fever,nausea/vomiting,abdominal pain, uncontrolled pain, chest pain,  shortness of breath or any other alarming symptoms.  Please follow-up your doctor as instructed in a week time and call the office for appointment.  Please avoid alcohol, smoking, or any other illicit substance and maintain healthy habits including taking your regular medications as prescribed.  You were cared for by a hospitalist during your hospital stay. If you have any questions about your discharge medications or the care you received while you were in the hospital after you are discharged, you can call the unit and ask to speak with the hospitalist on call if the hospitalist that took care of you is not available.  Once you are discharged, your primary care physician will handle any further medical issues. Please note that NO REFILLS for any discharge medications will be authorized once you are discharged, as it is imperative that you return to your primary care physician (or establish a relationship with a primary care physician if you do not have one) for your aftercare needs so that they can reassess your need for medications and monitor your lab values   If the dressing is still on your incision site when you go home, remove it on the third day after your surgery date. Remove dressing if it begins to fall off, or if it is dirty or damaged before the third day.   Complete by: As directed    Increase activity slowly   Complete by: As directed       Allergies as of 03/17/2023       Reactions   Motrin [ibuprofen] Other (See Comments)   GI bleed   Ace Inhibitors Cough   Breztri Aerosphere  [budeson-glycopyrrol-formoterol] Hypertension   Cipro [ciprofloxacin Hcl] Other (See Comments)   Avoid fluoroquinolones due to asc-aortic aneurysm   Levaquin [levofloxacin] Other (See Comments)   Avoid fluoroquinolones due to asc-aortic aneurysm   Nsaids Nausea And Vomiting, Other (See Comments)   Hx of GI bleed        Medication List     STOP taking these medications    furosemide 40 MG tablet Commonly known as: LASIX   metoprolol tartrate 25 MG tablet Commonly known as: LOPRESSOR   potassium chloride SA 20 MEQ tablet Commonly known as: KLOR-CON M       TAKE these medications    albuterol 108 (90 Base) MCG/ACT inhaler Commonly known as: VENTOLIN HFA Inhale 2 puffs into the lungs every 6 (six) hours as needed for wheezing or shortness of breath.   Aspercreme Lidocaine 4 % Generic drug: lidocaine Place 1 patch onto the skin daily. What changed: Another medication with the same name was removed. Continue taking this medication, and follow the directions you see here.   aspirin EC 81 MG tablet Take 1 tablet (81 mg total) by mouth daily. Swallow whole.   atorvastatin 40 MG tablet Commonly  known as: LIPITOR Take 40 mg by mouth at bedtime.   benzonatate 200 MG capsule Commonly known as: TESSALON Take 200 mg by mouth in the morning, at noon, and at bedtime.   dextromethorphan 30 MG/5ML liquid Commonly known as: DELSYM Take 2.5 mLs by mouth 2 (two) times daily.   docusate sodium 100 MG capsule Commonly known as: COLACE Take 1 capsule (100 mg total) by mouth daily.   ferrous sulfate 325 (65 FE) MG tablet Take 325 mg by mouth daily.   guaiFENesin 600 MG 12 hr tablet Commonly known as: MUCINEX Take 600 mg by mouth 2 (two) times daily.   Ipratropium-Albuterol 20-100 MCG/ACT Aers respimat Commonly known as: COMBIVENT Inhale 1 puff into the lungs in the morning and at bedtime.   ipratropium-albuterol 0.5-2.5 (3) MG/3ML Soln Commonly known as: DUONEB Take 3 mLs  by nebulization in the morning, at noon, and at bedtime.   melatonin 3 MG Tabs tablet Take 6 mg by mouth at bedtime.   midodrine 10 MG tablet Commonly known as: PROAMATINE Take 1 tablet (10 mg total) by mouth 3 (three) times daily with meals.   mometasone-formoterol 100-5 MCG/ACT Aero Commonly known as: DULERA Inhale 2 puffs into the lungs 2 (two) times daily. What changed: Another medication with the same name was removed. Continue taking this medication, and follow the directions you see here.   montelukast 10 MG tablet Commonly known as: SINGULAIR Take 1 tablet (10 mg total) by mouth daily.   ondansetron 4 MG tablet Commonly known as: ZOFRAN Take 4 mg by mouth every 6 (six) hours as needed for nausea or vomiting.   oxyCODONE-acetaminophen 5-325 MG tablet Commonly known as: PERCOCET/ROXICET Take 1 tablet by mouth every 8 (eight) hours as needed for up to 6 doses for moderate pain (pain score 4-6) or severe pain (pain score 7-10).   pantoprazole 40 MG tablet Commonly known as: PROTONIX Take 40 mg by mouth in the morning and at bedtime.   Pentasa 500 MG CR capsule Generic drug: mesalamine Take 1,000 mg by mouth in the morning and at bedtime.   Pro-Stat Liqd Take 30 mLs by mouth daily.   sodium chloride 0.65 % Soln nasal spray Commonly known as: OCEAN Place 1 spray into both nostrils in the morning, at noon, and at bedtime.   torsemide 20 MG tablet Commonly known as: DEMADEX Take 1 tablet (20 mg total) by mouth daily. Start taking on: March 18, 2023   VITAMIN D-3 PO Take 1 capsule by mouth daily.               Discharge Care Instructions  (From admission, onward)           Start     Ordered   03/17/23 0000  If the dressing is still on your incision site when you go home, remove it on the third day after your surgery date. Remove dressing if it begins to fall off, or if it is dirty or damaged before the third day.        03/17/23 1104             Follow-up Information     [name], MD Follow up in 1 week(s).   Specialty: Internal Medicine Contact information: [address] [place] [address] [phone]                Allergies  Allergen Reactions   Motrin [Ibuprofen] Other (See Comments)    GI bleed  Ace Inhibitors Cough   Breztri Aerosphere [Budeson-Glycopyrrol-Formoterol] Hypertension   Cipro [Ciprofloxacin Hcl] Other (See Comments)    Avoid fluoroquinolones due to asc-aortic aneurysm   Levaquin [Levofloxacin] Other (See Comments)    Avoid fluoroquinolones due to asc-aortic aneurysm   Nsaids Nausea And Vomiting and Other (See Comments)    Hx of GI bleed    The results of significant diagnostics from this hospitalization (including imaging, microbiology, ancillary and laboratory) are listed below for reference.    Microbiology: Recent Results (from the past 240 hours)  Resp panel by RT-PCR (RSV, Flu A&B, Covid) Anterior Nasal Swab     Status: None   Collection Time: 03/07/23 11:27 AM   Specimen: Anterior Nasal Swab  Result Value Ref Range Status   SARS Coronavirus 2 by RT PCR NEGATIVE NEGATIVE Final   Influenza A by PCR NEGATIVE NEGATIVE Final   Influenza B by PCR NEGATIVE NEGATIVE Final    Comment: (NOTE) The Xpert Xpress SARS-CoV-2/FLU/RSV plus assay is intended as an aid in the diagnosis of influenza from Nasopharyngeal swab specimens and should not be used as a sole basis for treatment. Nasal washings and aspirates are unacceptable for Xpert Xpress SARS-CoV-2/FLU/RSV testing.  Fact Sheet for Patients: BloggerCourse.com  Fact Sheet for Healthcare Providers: SeriousBroker.it  This test is not yet approved or cleared by the [place] FDA and has been authorized for detection and/or diagnosis of SARS-CoV-2 by FDA under an Emergency Use Authorization (EUA). This EUA will remain in effect (meaning this test can be used)  for the duration of the COVID-19 declaration under Section 564(b)(1) of the Act, 21 U.S.C. section 360bbb-3(b)(1), unless the authorization is terminated or revoked.     Resp Syncytial Virus by PCR NEGATIVE NEGATIVE Final    Comment: (NOTE) Fact Sheet for Patients: BloggerCourse.com  Fact Sheet for Healthcare Providers: SeriousBroker.it  This test is not yet approved or cleared by the [place] FDA and has been authorized for detection and/or diagnosis of SARS-CoV-2 by FDA under an Emergency Use Authorization (EUA). This EUA will remain in effect (meaning this test can be used) for the duration of the COVID-19 declaration under Section 564(b)(1) of the Act, 21 U.S.C. section 360bbb-3(b)(1), unless the authorization is terminated or revoked.  Performed at [facility], [address] [place], [place] [address]     Procedures/Studies: DG Knee 1-2 Views Right Result Date: 03/17/2023 CLINICAL DATA:  Right knee pain. EXAM: RIGHT KNEE - 2 VIEW COMPARISON:  Right knee radiographs 03/11/2023 FINDINGS: Stable degenerative changes are predominantly in the patellofemoral compartment. No acute abnormalities are present. Atherosclerotic calcifications are present. Moderate osteopenia is present. IMPRESSION: 1. Stable degenerative changes predominantly in the patellofemoral compartment. 2. No acute abnormality. 3. Moderate osteopenia. Electronically Signed   By: Marin Roberts M.D.   On: 03/17/2023 11:02   DG Ankle 2 Views Right Result Date: 03/11/2023 CLINICAL DATA:  Pain EXAM: RIGHT ANKLE - 2 VIEW COMPARISON:  None Available. FINDINGS: Soft tissue swelling about the ankle. Osteopenia. No fracture or dislocation. There is subchondral lucency along the talar dome medially. Possible osteochondral abnormality. IMPRESSION: Osteopenia with lucency along the talar dome. Possible osteochondral abnormality. Soft tissue swelling. Electronically  Signed   By: Karen Kays M.D.   On: 03/11/2023 13:54   DG Knee 1-2 Views Right Result Date: 03/11/2023 CLINICAL DATA:  Pain after fall EXAM: RIGHT KNEE - 2 VIEW COMPARISON:  None Available. FINDINGS: Osteopenia. Slight joint space loss of the patellofemoral joint and medial compartment. Small  osteophytes. No fracture or dislocation. Trace joint fluid. Calcifications seen along the patellar tendon. Prominent vascular calcifications posterior to the knee. IMPRESSION: Osteopenia with mild degenerative changes. Trace joint fluid. Please correlate with clinical findings. Electronically Signed   By: Karen Kays M.D.   On: 03/11/2023 12:59   ECHOCARDIOGRAM COMPLETE Result Date: 03/08/2023    ECHOCARDIOGRAM REPORT   Patient Name:   Felicia Benson Date of Exam: 03/08/2023 Medical Rec #:  [id number]        Height:       66.0 in Accession #:    [id number]       Weight:       210.4 lb Date of Birth:  09/30/1930         BSA:          2.043 m Patient Age:    89 years         BP:           121/67 mmHg Patient Gender: F                HR:           89 bpm. Exam Location:  Inpatient Procedure: 2D Echo, Cardiac Doppler and Color Doppler (Both Spectral and Color            Flow Doppler were utilized during procedure). Indications:    CHF  History:        Patient has prior history of Echocardiogram examinations, most                 recent 07/23/2022. CHF, CAD, COPD and TIA,                 Signs/Symptoms:Shortness of Breath; Risk Factors:Hypertension                 and Dyslipidemia. CKD stge 3.  Sonographer:    Vern Claude Referring Phys: [id number] AVA SWAYZE IMPRESSIONS  1. Left ventricular ejection fraction, by estimation, is 50 to 55%. The left ventricle has low normal function. Left ventricular endocardial border not optimally defined to evaluate regional wall motion. Left ventricular diastolic parameters are indeterminate.  2. D-shaped septum suggestive of RV pressure/volume overload. Right ventricular systolic function is  severely reduced. The right ventricular size is moderately enlarged. There is moderately elevated pulmonary artery systolic pressure. The estimated right ventricular systolic pressure is 59.6 mmHg.  3. Right atrial size was mildly dilated.  4. The mitral valve is normal in structure. No evidence of mitral valve regurgitation. No evidence of mitral stenosis.  5. The aortic valve is tricuspid. There is mild calcification of the aortic valve. Aortic valve regurgitation is trivial. No aortic stenosis is present.  6. Tricuspid valve regurgitation is moderate.  7. Aortic dilatation noted. There is mild dilatation of the ascending aorta, measuring 43 mm.  8. The inferior vena cava is normal in size with greater than 50% respiratory variability, suggesting right atrial pressure of 3 mmHg.  9. A small pericardial effusion is present. The pericardial effusion is posterior and lateral to the left ventricle. FINDINGS  Left Ventricle: Left ventricular ejection fraction, by estimation, is 50 to 55%. The left ventricle has low normal function. Left ventricular endocardial border not optimally defined to evaluate regional wall motion. The left ventricular internal cavity  size was normal in size. There is no left ventricular hypertrophy. Left ventricular diastolic parameters are indeterminate. Right Ventricle: D-shaped septum suggestive of RV pressure/volume overload. The right ventricular size is moderately  enlarged. No increase in right ventricular wall thickness. Right ventricular systolic function is severely reduced. There is moderately elevated pulmonary artery systolic pressure. The tricuspid regurgitant velocity is 3.76 m/s, and with an assumed right atrial pressure of 3 mmHg, the estimated right ventricular systolic pressure is 59.6 mmHg. Left Atrium: Left atrial size was normal in size. Right Atrium: Right atrial size was mildly dilated. Pericardium: A small pericardial effusion is present. The pericardial effusion is  posterior and lateral to the left ventricle. Mitral Valve: The mitral valve is normal in structure. Mild mitral annular calcification. No evidence of mitral valve regurgitation. No evidence of mitral valve stenosis. MV peak gradient, 6.1 mmHg. The mean mitral valve gradient is 2.0 mmHg. Tricuspid Valve: The tricuspid valve is normal in structure. Tricuspid valve regurgitation is moderate. Aortic Valve: The aortic valve is tricuspid. There is mild calcification of the aortic valve. Aortic valve regurgitation is trivial. No aortic stenosis is present. Aortic valve mean gradient measures 3.0 mmHg. Aortic valve peak gradient measures 6.1 mmHg. Aortic valve area, by VTI measures 2.84 cm. Pulmonic Valve: The pulmonic valve was normal in structure. Pulmonic valve regurgitation is mild. Aorta: Aortic dilatation noted. There is mild dilatation of the ascending aorta, measuring 43 mm. Venous: The inferior vena cava is normal in size with greater than 50% respiratory variability, suggesting right atrial pressure of 3 mmHg. IAS/Shunts: No atrial level shunt detected by color flow Doppler. Additional Comments: A device lead is visualized in the right ventricle.  LEFT VENTRICLE PLAX 2D LVIDd:         4.00 cm     Diastology LVIDs:         2.50 cm     LV e' lateral:   14.50 cm/s LV PW:         0.90 cm     LV E/e' lateral: 8.8 LV IVS:        0.90 cm LVOT diam:     2.00 cm LV SV:         50 LV SV Index:   25 LVOT Area:     3.14 cm  LV Volumes (MOD) LV vol d, MOD A2C: 93.6 ml LV vol d, MOD A4C: 90.8 ml LV vol s, MOD A2C: 32.5 ml LV vol s, MOD A4C: 29.3 ml LV SV MOD A2C:     61.1 ml LV SV MOD A4C:     90.8 ml LV SV MOD BP:      63.8 ml RIGHT VENTRICLE            IVC RV Basal diam:  4.10 cm    IVC diam: 1.00 cm RV Mid diam:    4.00 cm RV S prime:     6.53 cm/s TAPSE (M-mode): 1.1 cm LEFT ATRIUM             Index        RIGHT ATRIUM           Index LA diam:        2.50 cm 1.22 cm/m   RA Area:     20.90 cm LA Vol (A2C):   42.3 ml  20.70 ml/m  RA Volume:   52.70 ml  25.79 ml/m LA Vol (A4C):   32.9 ml 16.10 ml/m LA Biplane Vol: 37.3 ml 18.25 ml/m  AORTIC VALVE                    PULMONIC VALVE AV Area (Vmax):    2.21 cm  PV Vmax:          0.94 m/s AV Area (Vmean):   2.63 cm     PV Peak grad:     3.6 mmHg AV Area (VTI):     2.84 cm     PR End Diast Vel: 9.73 msec AV Vmax:           123.00 cm/s AV Vmean:          75.200 cm/s AV VTI:            0.177 m AV Peak Grad:      6.1 mmHg AV Mean Grad:      3.0 mmHg LVOT Vmax:         86.60 cm/s LVOT Vmean:        62.900 cm/s LVOT VTI:          0.160 m LVOT/AV VTI ratio: 0.90  AORTA Ao Root diam: 3.60 cm Ao Asc diam:  4.10 cm MITRAL VALVE                TRICUSPID VALVE MV Area (PHT): 6.77 cm     TR Peak grad:   56.6 mmHg MV Area VTI:   3.06 cm     TR Vmax:        376.00 cm/s MV Peak grad:  6.1 mmHg MV Mean grad:  2.0 mmHg     SHUNTS MV Vmax:       1.23 m/s     Systemic VTI:  0.16 m MV Vmean:      69.2 cm/s    Systemic Diam: 2.00 cm MV Decel Time: 112 msec MV E velocity: 127.00 cm/s Dalton McleanMD Electronically signed by Wilfred Lacy Signature Date/Time: 03/08/2023/9:26:51 AM    Final    DG Chest Port 1 View Result Date: 03/07/2023 CLINICAL DATA:  Shortness of breath.  Weakness. EXAM: PORTABLE CHEST 1 VIEW COMPARISON:  02/07/2023. FINDINGS: Re-demonstration of left retrocardiac airspace opacity obscuring the left hemidiaphragm, descending thoracic aorta and blunting the left lateral costophrenic angle, suggesting combination of left lung atelectasis and/or consolidation with pleural effusion. No significant interval change. There is probable small layering right pleural effusion as well, similar to the prior study. Bilateral lung fields are otherwise clear. Normal cardio-mediastinal silhouette. There is a left sided 2-lead pacemaker. No acute osseous abnormalities. Lower cervical spinal fixation hardware noted. Mild asymmetric fullness over the right lower neck corresponds to goiter on  the prior CT scan chest. The soft tissues are otherwise within normal limits. IMPRESSION: *No significant interval change since the prior study. Persistent left retrocardiac opacity and small layering right pleural effusion, as described above. Electronically Signed   By: Jules Schick M.D.   On: 03/07/2023 14:21    Labs: BNP (last 3 results) Recent Labs    02/07/23 2155 03/07/23 1213 03/14/23 0430  BNP 829.2* 1,390.1* 2,510.3*   Basic Metabolic Panel: Recent Labs  Lab 03/11/23 0457 03/12/23 0606 03/13/23 0433 03/14/23 0430 03/15/23 0423 03/16/23 0437  NA 135 135 133* 130* 132* 132*  K 4.6 4.6 4.5 4.0 3.9 4.5  CL 94* 96* 90* 93* 93* 96*  CO2 33* 28 28 28 25 29   GLUCOSE 92 93 110* 98 94 110*  BUN 29* 30* 38* 42* 40* 45*  CREATININE 1.19* 1.32* 1.60* 1.38* 1.30* 1.28*  CALCIUM 8.2* 8.2* 8.1* 7.4* 7.7* 7.6*  MG 2.2  --   --   --  2.0  --    Liver Function Tests: Recent Labs  Lab 03/13/23 0429  ALBUMIN 2.2*   No results for input(s): "LIPASE", "AMYLASE" in the last 168 hours. No results for input(s): "AMMONIA" in the last 168 hours. CBC: No results for input(s): "WBC", "NEUTROABS", "HGB", "HCT", "MCV", "PLT" in the last 168 hours. Cardiac Enzymes: No results for input(s): "CKTOTAL", "CKMB", "CKMBINDEX", "TROPONINI" in the last 168 hours. BNP: Invalid input(s): "POCBNP" CBG: No results for input(s): "GLUCAP" in the last 168 hours. D-Dimer No results for input(s): "DDIMER" in the last 72 hours. Hgb A1c No results for input(s): "HGBA1C" in the last 72 hours. Lipid Profile No results for input(s): "CHOL", "HDL", "LDLCALC", "TRIG", "CHOLHDL", "LDLDIRECT" in the last 72 hours. Thyroid function studies No results for input(s): "TSH", "T4TOTAL", "T3FREE", "THYROIDAB" in the last 72 hours.  Invalid input(s): "FREET3" Anemia work up No results for input(s): "VITAMINB12", "FOLATE", "FERRITIN", "TIBC", "IRON", "RETICCTPCT" in the last 72 hours. Urinalysis    Component  Value Date/Time   COLORURINE YELLOW 12/31/2020 0100   APPEARANCEUR CLEAR 12/31/2020 0100   LABSPEC 1.020 12/31/2020 0100   PHURINE 6.0 12/31/2020 0100   GLUCOSEU NEGATIVE 12/31/2020 0100   HGBUR NEGATIVE 12/31/2020 0100   BILIRUBINUR NEGATIVE 12/31/2020 0100   KETONESUR NEGATIVE 12/31/2020 0100   PROTEINUR NEGATIVE 12/31/2020 0100   NITRITE POSITIVE (A) 12/31/2020 0100   LEUKOCYTESUR NEGATIVE 12/31/2020 0100   Sepsis Labs No results for input(s): "WBC" in the last 168 hours.  Invalid input(s): "PROCALCITONIN", "LACTICIDVEN" Microbiology Recent Results (from the past 240 hours)  Resp panel by RT-PCR (RSV, Flu A&B, Covid) Anterior Nasal Swab     Status: None   Collection Time: 03/07/23 11:27 AM   Specimen: Anterior Nasal Swab  Result Value Ref Range Status   SARS Coronavirus 2 by RT PCR NEGATIVE NEGATIVE Final   Influenza A by PCR NEGATIVE NEGATIVE Final   Influenza B by PCR NEGATIVE NEGATIVE Final    Comment: (NOTE) The Xpert Xpress SARS-CoV-2/FLU/RSV plus assay is intended as an aid in the diagnosis of influenza from Nasopharyngeal swab specimens and should not be used as a sole basis for treatment. Nasal washings and aspirates are unacceptable for Xpert Xpress SARS-CoV-2/FLU/RSV testing.  Fact Sheet for Patients: BloggerCourse.com  Fact Sheet for Healthcare Providers: SeriousBroker.it  This test is not yet approved or cleared by the [place] FDA and has been authorized for detection and/or diagnosis of SARS-CoV-2 by FDA under an Emergency Use Authorization (EUA). This EUA will remain in effect (meaning this test can be used) for the duration of the COVID-19 declaration under Section 564(b)(1) of the Act, 21 U.S.C. section 360bbb-3(b)(1), unless the authorization is terminated or revoked.     Resp Syncytial Virus by PCR NEGATIVE NEGATIVE Final    Comment: (NOTE) Fact Sheet for  Patients: BloggerCourse.com  Fact Sheet for Healthcare Providers: SeriousBroker.it  This test is not yet approved or cleared by the [place] FDA and has been authorized for detection and/or diagnosis of SARS-CoV-2 by FDA under an Emergency Use Authorization (EUA). This EUA will remain in effect (meaning this test can be used) for the duration of the COVID-19 declaration under Section 564(b)(1) of the Act, 21 U.S.C. section 360bbb-3(b)(1), unless the authorization is terminated or revoked.  Performed at [facility], [address], [place], [place] [address]      Time coordinating discharge: 35 minutes  SIGNED: [name], MD  [facility] 03/17/2023, 12:05 PM  If 7PM-7AM, please contact night-coverage [email or web address]

## 2023-03-17 NOTE — Hospital Course (Addendum)
 89 year old female with chronic diastolic CHF, PE/DVT status post IVC filter, HTN, HLP, COPD, CAD status post PCI of RCA, chronic respiratory failure with hypoxia on 4 L O2 via [place] at home with recent admission for CAP PNA, PE/LLE DVT status post IVC filter (02/07/2023-02/19/2023) presented with hypoxia of 77% at the rehab and shortness of breath -ED 8 L HFNC and in the ED hypotensive chest x-ray showed small layering right pleural effusion, troponin 48-45, BNP 1390 and admitted for acute on chronic hypoxic respiratory failure acute on chronic diastolic CHF, renal failure and hypertension.  Seen by cardiology palliative care.  Diuresis limited by hypotension.  PT OT recommended skilled nursing facility, a SNF was pursued.  On 3/8 patient did not feel well enough for discharge to skilled nursing facility.  Was monitored overnight, afebrile vitally stable doing well on 4 L nasal cannula  Consultation: Cardiology: Advised to continue torsemide midodrine Palliative care: advised to continue to treat treatable but no CPR or intubation.

## 2023-03-17 NOTE — Plan of Care (Signed)

## 2023-03-18 NOTE — Progress Notes (Signed)
 Patient transferred to 5C15 in bed with oxygen.  Patient reports no pain upon transfer.  Chart and discharge packet given to [place], [place]

## 2023-03-27 ENCOUNTER — Ambulatory Visit (INDEPENDENT_AMBULATORY_CARE_PROVIDER_SITE_OTHER): Admitting: Podiatry

## 2023-03-27 DIAGNOSIS — Z91199 Patient's noncompliance with other medical treatment and regimen due to unspecified reason: Secondary | ICD-10-CM

## 2023-03-27 NOTE — Progress Notes (Signed)
.   1. No-show for appointment    Transportation running late from facility.

## 2023-04-05 ENCOUNTER — Ambulatory Visit (HOSPITAL_BASED_OUTPATIENT_CLINIC_OR_DEPARTMENT_OTHER): Admitting: Family

## 2023-04-07 ENCOUNTER — Inpatient Hospital Stay (HOSPITAL_COMMUNITY)
Admission: EM | Admit: 2023-04-07 | Discharge: 2023-04-10 | DRG: 291 | Disposition: A | Discharge status: Skilled Nursing Facility | Source: Skilled Nursing Facility | Attending: Internal Medicine | Admitting: Internal Medicine

## 2023-04-07 ENCOUNTER — Encounter (HOSPITAL_COMMUNITY): Payer: Self-pay | Admitting: Emergency Medicine

## 2023-04-07 ENCOUNTER — Other Ambulatory Visit: Payer: Self-pay

## 2023-04-07 ENCOUNTER — Emergency Department (HOSPITAL_COMMUNITY)

## 2023-04-07 DIAGNOSIS — I447 Left bundle-branch block, unspecified: Secondary | ICD-10-CM | POA: Diagnosis present

## 2023-04-07 DIAGNOSIS — J449 Chronic obstructive pulmonary disease, unspecified: Secondary | ICD-10-CM | POA: Diagnosis present

## 2023-04-07 DIAGNOSIS — I5023 Acute on chronic systolic (congestive) heart failure: Principal | ICD-10-CM | POA: Diagnosis present

## 2023-04-07 DIAGNOSIS — Z9049 Acquired absence of other specified parts of digestive tract: Secondary | ICD-10-CM

## 2023-04-07 DIAGNOSIS — I5043 Acute on chronic combined systolic (congestive) and diastolic (congestive) heart failure: Secondary | ICD-10-CM | POA: Diagnosis not present

## 2023-04-07 DIAGNOSIS — Z95828 Presence of other vascular implants and grafts: Secondary | ICD-10-CM

## 2023-04-07 DIAGNOSIS — K5090 Crohn's disease, unspecified, without complications: Secondary | ICD-10-CM | POA: Diagnosis present

## 2023-04-07 DIAGNOSIS — Z881 Allergy status to other antibiotic agents status: Secondary | ICD-10-CM

## 2023-04-07 DIAGNOSIS — I130 Hypertensive heart and chronic kidney disease with heart failure and stage 1 through stage 4 chronic kidney disease, or unspecified chronic kidney disease: Secondary | ICD-10-CM | POA: Diagnosis present

## 2023-04-07 DIAGNOSIS — Z823 Family history of stroke: Secondary | ICD-10-CM

## 2023-04-07 DIAGNOSIS — Z1152 Encounter for screening for COVID-19: Secondary | ICD-10-CM | POA: Diagnosis not present

## 2023-04-07 DIAGNOSIS — Z950 Presence of cardiac pacemaker: Secondary | ICD-10-CM

## 2023-04-07 DIAGNOSIS — Z87891 Personal history of nicotine dependence: Secondary | ICD-10-CM

## 2023-04-07 DIAGNOSIS — I2489 Other forms of acute ischemic heart disease: Secondary | ICD-10-CM | POA: Diagnosis present

## 2023-04-07 DIAGNOSIS — I5A Non-ischemic myocardial injury (non-traumatic): Secondary | ICD-10-CM | POA: Diagnosis present

## 2023-04-07 DIAGNOSIS — I509 Heart failure, unspecified: Secondary | ICD-10-CM

## 2023-04-07 DIAGNOSIS — M25552 Pain in left hip: Secondary | ICD-10-CM | POA: Diagnosis present

## 2023-04-07 DIAGNOSIS — R011 Cardiac murmur, unspecified: Secondary | ICD-10-CM | POA: Diagnosis present

## 2023-04-07 DIAGNOSIS — I714 Abdominal aortic aneurysm, without rupture, unspecified: Secondary | ICD-10-CM | POA: Diagnosis present

## 2023-04-07 DIAGNOSIS — I459 Conduction disorder, unspecified: Secondary | ICD-10-CM | POA: Diagnosis present

## 2023-04-07 DIAGNOSIS — I2510 Atherosclerotic heart disease of native coronary artery without angina pectoris: Secondary | ICD-10-CM | POA: Diagnosis present

## 2023-04-07 DIAGNOSIS — E041 Nontoxic single thyroid nodule: Secondary | ICD-10-CM | POA: Diagnosis present

## 2023-04-07 DIAGNOSIS — K219 Gastro-esophageal reflux disease without esophagitis: Secondary | ICD-10-CM | POA: Diagnosis present

## 2023-04-07 DIAGNOSIS — Z7951 Long term (current) use of inhaled steroids: Secondary | ICD-10-CM

## 2023-04-07 DIAGNOSIS — G4733 Obstructive sleep apnea (adult) (pediatric): Secondary | ICD-10-CM | POA: Diagnosis present

## 2023-04-07 DIAGNOSIS — N1831 Chronic kidney disease, stage 3a: Secondary | ICD-10-CM | POA: Diagnosis present

## 2023-04-07 DIAGNOSIS — Z801 Family history of malignant neoplasm of trachea, bronchus and lung: Secondary | ICD-10-CM

## 2023-04-07 DIAGNOSIS — E785 Hyperlipidemia, unspecified: Secondary | ICD-10-CM | POA: Diagnosis present

## 2023-04-07 DIAGNOSIS — L89153 Pressure ulcer of sacral region, stage 3: Secondary | ICD-10-CM | POA: Diagnosis present

## 2023-04-07 DIAGNOSIS — Z955 Presence of coronary angioplasty implant and graft: Secondary | ICD-10-CM

## 2023-04-07 DIAGNOSIS — Z86718 Personal history of other venous thrombosis and embolism: Secondary | ICD-10-CM

## 2023-04-07 DIAGNOSIS — D638 Anemia in other chronic diseases classified elsewhere: Secondary | ICD-10-CM | POA: Diagnosis present

## 2023-04-07 DIAGNOSIS — Z7982 Long term (current) use of aspirin: Secondary | ICD-10-CM

## 2023-04-07 DIAGNOSIS — M542 Cervicalgia: Secondary | ICD-10-CM | POA: Diagnosis present

## 2023-04-07 DIAGNOSIS — E876 Hypokalemia: Secondary | ICD-10-CM | POA: Diagnosis present

## 2023-04-07 DIAGNOSIS — Z66 Do not resuscitate: Secondary | ICD-10-CM | POA: Diagnosis present

## 2023-04-07 DIAGNOSIS — Z888 Allergy status to other drugs, medicaments and biological substances status: Secondary | ICD-10-CM

## 2023-04-07 DIAGNOSIS — Z79899 Other long term (current) drug therapy: Secondary | ICD-10-CM

## 2023-04-07 DIAGNOSIS — Z8719 Personal history of other diseases of the digestive system: Secondary | ICD-10-CM

## 2023-04-07 DIAGNOSIS — I5084 End stage heart failure: Secondary | ICD-10-CM | POA: Diagnosis present

## 2023-04-07 DIAGNOSIS — Z886 Allergy status to analgesic agent status: Secondary | ICD-10-CM

## 2023-04-07 DIAGNOSIS — Z9889 Other specified postprocedural states: Secondary | ICD-10-CM

## 2023-04-07 DIAGNOSIS — Z7189 Other specified counseling: Secondary | ICD-10-CM

## 2023-04-07 DIAGNOSIS — L98429 Non-pressure chronic ulcer of back with unspecified severity: Secondary | ICD-10-CM | POA: Insufficient documentation

## 2023-04-07 DIAGNOSIS — J9621 Acute and chronic respiratory failure with hypoxia: Secondary | ICD-10-CM | POA: Diagnosis present

## 2023-04-07 LAB — I-STAT CHEM 8, ED
BUN: 35 mg/dL — ABNORMAL HIGH (ref 8–23)
Calcium, Ion: 1.06 mmol/L — ABNORMAL LOW (ref 1.15–1.40)
Chloride: 93 mmol/L — ABNORMAL LOW (ref 98–111)
Creatinine, Ser: 1.3 mg/dL — ABNORMAL HIGH (ref 0.44–1.00)
Glucose, Bld: 106 mg/dL — ABNORMAL HIGH (ref 70–99)
HCT: 39 % (ref 36.0–46.0)
Hemoglobin: 13.3 g/dL (ref 12.0–15.0)
Potassium: 3.1 mmol/L — ABNORMAL LOW (ref 3.5–5.1)
Sodium: 141 mmol/L (ref 135–145)
TCO2: 36 mmol/L — ABNORMAL HIGH (ref 22–32)

## 2023-04-07 LAB — COMPREHENSIVE METABOLIC PANEL WITH GFR
ALT: 13 U/L (ref 0–44)
AST: 21 U/L (ref 15–41)
Albumin: 2.7 g/dL — ABNORMAL LOW (ref 3.5–5.0)
Alkaline Phosphatase: 65 U/L (ref 38–126)
Anion gap: 11 (ref 5–15)
BUN: 35 mg/dL — ABNORMAL HIGH (ref 8–23)
CO2: 36 mmol/L — ABNORMAL HIGH (ref 22–32)
Calcium: 8.6 mg/dL — ABNORMAL LOW (ref 8.9–10.3)
Chloride: 93 mmol/L — ABNORMAL LOW (ref 98–111)
Creatinine, Ser: 1.18 mg/dL — ABNORMAL HIGH (ref 0.44–1.00)
GFR, Estimated: 44 mL/min — ABNORMAL LOW (ref >60)
Glucose, Bld: 109 mg/dL — ABNORMAL HIGH (ref 70–99)
Potassium: 3.1 mmol/L — ABNORMAL LOW (ref 3.5–5.1)
Sodium: 140 mmol/L (ref 135–145)
Total Bilirubin: 1.4 mg/dL — ABNORMAL HIGH (ref 0.0–1.2)
Total Protein: 6.6 g/dL (ref 6.5–8.1)

## 2023-04-07 LAB — CBC WITH DIFFERENTIAL/PLATELET
Abs Immature Granulocytes: 0.02 10*3/uL (ref 0.00–0.07)
Basophils Absolute: 0 10*3/uL (ref 0.0–0.1)
Basophils Relative: 0 %
Eosinophils Absolute: 0 10*3/uL (ref 0.0–0.5)
Eosinophils Relative: 1 %
HCT: 37.2 % (ref 36.0–46.0)
Hemoglobin: 11.4 g/dL — ABNORMAL LOW (ref 12.0–15.0)
Immature Granulocytes: 0 %
Lymphocytes Relative: 7 %
Lymphs Abs: 0.5 10*3/uL — ABNORMAL LOW (ref 0.7–4.0)
MCH: 30.6 pg (ref 26.0–34.0)
MCHC: 30.6 g/dL (ref 30.0–36.0)
MCV: 99.7 fL (ref 80.0–100.0)
Monocytes Absolute: 0.8 10*3/uL (ref 0.1–1.0)
Monocytes Relative: 10 %
Neutro Abs: 6.3 10*3/uL (ref 1.7–7.7)
Neutrophils Relative %: 82 %
Platelets: 243 10*3/uL (ref 150–400)
RBC: 3.73 MIL/uL — ABNORMAL LOW (ref 3.87–5.11)
RDW: 21.2 % — ABNORMAL HIGH (ref 11.5–15.5)
Smear Review: NORMAL
WBC: 7.7 10*3/uL (ref 4.0–10.5)
nRBC: 0 % (ref 0.0–0.2)

## 2023-04-07 LAB — TROPONIN I (HIGH SENSITIVITY)
Troponin I (High Sensitivity): 103 ng/L (ref <18)
Troponin I (High Sensitivity): 82 ng/L — ABNORMAL HIGH (ref <18)

## 2023-04-07 LAB — MAGNESIUM: Magnesium: 1.7 mg/dL (ref 1.7–2.4)

## 2023-04-07 LAB — BRAIN NATRIURETIC PEPTIDE: B Natriuretic Peptide: 2673.6 pg/mL — ABNORMAL HIGH (ref 0.0–100.0)

## 2023-04-07 MED ORDER — ACETAMINOPHEN 500 MG PO TABS
1000.0000 mg | ORAL_TABLET | Freq: Once | ORAL | Status: AC
  Administered 2023-04-07: 1000 mg via ORAL
  Filled 2023-04-07: qty 2

## 2023-04-07 MED ORDER — SODIUM CHLORIDE 0.9% FLUSH
3.0000 mL | Freq: Two times a day (BID) | INTRAVENOUS | Status: DC
  Administered 2023-04-07 – 2023-04-09 (×5): 3 mL via INTRAVENOUS

## 2023-04-07 MED ORDER — MIDODRINE HCL 5 MG PO TABS
5.0000 mg | ORAL_TABLET | Freq: Three times a day (TID) | ORAL | Status: DC
  Administered 2023-04-08 – 2023-04-10 (×7): 5 mg via ORAL
  Filled 2023-04-07 (×8): qty 1

## 2023-04-07 MED ORDER — ACETAMINOPHEN 500 MG PO TABS
1000.0000 mg | ORAL_TABLET | Freq: Four times a day (QID) | ORAL | Status: DC | PRN
  Administered 2023-04-09: 1000 mg via ORAL
  Filled 2023-04-07: qty 2

## 2023-04-07 MED ORDER — OXYCODONE HCL 5 MG PO TABS
5.0000 mg | ORAL_TABLET | Freq: Once | ORAL | Status: AC
  Administered 2023-04-07: 5 mg via ORAL
  Filled 2023-04-07: qty 1

## 2023-04-07 MED ORDER — FERROUS SULFATE 325 (65 FE) MG PO TABS
325.0000 mg | ORAL_TABLET | Freq: Every day | ORAL | Status: DC
  Administered 2023-04-08 – 2023-04-10 (×3): 325 mg via ORAL
  Filled 2023-04-07 (×3): qty 1

## 2023-04-07 MED ORDER — POLYETHYLENE GLYCOL 3350 17 G PO PACK: 17.0000 g | PACK | Freq: Every day | ORAL | Status: DC | PRN

## 2023-04-07 MED ORDER — ENOXAPARIN SODIUM 40 MG/0.4ML IJ SOSY
40.0000 mg | PREFILLED_SYRINGE | INTRAMUSCULAR | Status: DC
  Administered 2023-04-07 – 2023-04-09 (×3): 40 mg via SUBCUTANEOUS
  Filled 2023-04-07 (×3): qty 0.4

## 2023-04-07 MED ORDER — ONDANSETRON HCL 4 MG/2ML IJ SOLN: 4.0000 mg | Freq: Four times a day (QID) | INTRAMUSCULAR | Status: DC | PRN

## 2023-04-07 MED ORDER — MELATONIN 3 MG PO TABS
6.0000 mg | ORAL_TABLET | Freq: Every day | ORAL | Status: DC
  Administered 2023-04-07 – 2023-04-09 (×3): 6 mg via ORAL
  Filled 2023-04-07 (×3): qty 2

## 2023-04-07 MED ORDER — IPRATROPIUM-ALBUTEROL 0.5-2.5 (3) MG/3ML IN SOLN
RESPIRATORY_TRACT | Status: AC
  Filled 2023-04-07: qty 3

## 2023-04-07 MED ORDER — ASPIRIN 81 MG PO TBEC
81.0000 mg | DELAYED_RELEASE_TABLET | Freq: Every day | ORAL | Status: DC
  Administered 2023-04-08 – 2023-04-10 (×3): 81 mg via ORAL
  Filled 2023-04-07 (×3): qty 1

## 2023-04-07 MED ORDER — MOMETASONE FURO-FORMOTEROL FUM 100-5 MCG/ACT IN AERO
2.0000 | INHALATION_SPRAY | Freq: Two times a day (BID) | RESPIRATORY_TRACT | Status: DC
  Administered 2023-04-08 – 2023-04-10 (×5): 2 via RESPIRATORY_TRACT
  Filled 2023-04-07: qty 8.8

## 2023-04-07 MED ORDER — ALBUTEROL SULFATE (2.5 MG/3ML) 0.083% IN NEBU: 2.5000 mg | INHALATION_SOLUTION | RESPIRATORY_TRACT | Status: DC | PRN

## 2023-04-07 MED ORDER — MONTELUKAST SODIUM 10 MG PO TABS
10.0000 mg | ORAL_TABLET | Freq: Every day | ORAL | Status: DC
  Administered 2023-04-07 – 2023-04-10 (×4): 10 mg via ORAL
  Filled 2023-04-07 (×4): qty 1

## 2023-04-07 MED ORDER — PANTOPRAZOLE SODIUM 40 MG PO TBEC
40.0000 mg | DELAYED_RELEASE_TABLET | Freq: Two times a day (BID) | ORAL | Status: DC
  Administered 2023-04-07 – 2023-04-10 (×6): 40 mg via ORAL
  Filled 2023-04-07 (×4): qty 1
  Filled 2023-04-07: qty 2
  Filled 2023-04-07: qty 1

## 2023-04-07 MED ORDER — MIDODRINE HCL 5 MG PO TABS
10.0000 mg | ORAL_TABLET | Freq: Three times a day (TID) | ORAL | Status: DC
  Administered 2023-04-07: 10 mg via ORAL
  Filled 2023-04-07: qty 2

## 2023-04-07 MED ORDER — OXYCODONE-ACETAMINOPHEN 5-325 MG PO TABS
1.0000 | ORAL_TABLET | Freq: Three times a day (TID) | ORAL | Status: DC | PRN
  Administered 2023-04-08 – 2023-04-09 (×3): 1 via ORAL
  Filled 2023-04-07 (×3): qty 1

## 2023-04-07 MED ORDER — ATORVASTATIN CALCIUM 40 MG PO TABS
40.0000 mg | ORAL_TABLET | Freq: Every day | ORAL | Status: DC
  Administered 2023-04-07 – 2023-04-09 (×3): 40 mg via ORAL
  Filled 2023-04-07 (×3): qty 1

## 2023-04-07 MED ORDER — IPRATROPIUM-ALBUTEROL 0.5-2.5 (3) MG/3ML IN SOLN
3.0000 mL | Freq: Two times a day (BID) | RESPIRATORY_TRACT | Status: DC
  Administered 2023-04-08 (×2): 3 mL via RESPIRATORY_TRACT
  Filled 2023-04-07 (×3): qty 3

## 2023-04-07 MED ORDER — FUROSEMIDE 10 MG/ML IJ SOLN
40.0000 mg | Freq: Every day | INTRAMUSCULAR | Status: DC
  Administered 2023-04-08 – 2023-04-10 (×3): 40 mg via INTRAVENOUS
  Filled 2023-04-07 (×3): qty 4

## 2023-04-07 MED ORDER — IPRATROPIUM-ALBUTEROL 0.5-2.5 (3) MG/3ML IN SOLN
3.0000 mL | Freq: Four times a day (QID) | RESPIRATORY_TRACT | Status: DC
  Administered 2023-04-07: 3 mL via RESPIRATORY_TRACT

## 2023-04-07 MED ORDER — POTASSIUM CHLORIDE 20 MEQ PO PACK
60.0000 meq | PACK | ORAL | Status: AC
  Administered 2023-04-07 (×2): 60 meq via ORAL
  Filled 2023-04-07 (×2): qty 3

## 2023-04-07 MED ORDER — MAGNESIUM OXIDE -MG SUPPLEMENT 400 (240 MG) MG PO TABS
800.0000 mg | ORAL_TABLET | Freq: Once | ORAL | Status: AC
  Administered 2023-04-07: 800 mg via ORAL
  Filled 2023-04-07: qty 2

## 2023-04-07 MED ORDER — FUROSEMIDE 10 MG/ML IJ SOLN
40.0000 mg | Freq: Once | INTRAMUSCULAR | Status: AC
  Administered 2023-04-07: 40 mg via INTRAVENOUS
  Filled 2023-04-07: qty 4

## 2023-04-07 NOTE — H&P (Signed)
 History and Physical    Felicia Benson:[id number] DOB: [birth year] DOA: 04/07/2023  PCP: [name], MD   Patient coming from: SNF    Chief Complaint:  Chief Complaint  Patient presents with   Shortness of Breath    HPI:  Felicia Benson is a 89 y.o. female, retired Char[employer] fundraiser, with hx of heart failure with severe RV failure, LV preserved ejection fraction, CAD, PCI to RCA, heart block s/p pacemaker, aortic aneurysm, COPD with chronic hypoxic respiratory failure on 4 L O2, DVT/PE with IVC filter, not a candidate for anticoagulation due to history of GI bleeding., CKD stage IIIa, Crohn's, anemia of chronic disease, recent admission from 2/27 - 3/9 for respiratory failure related to exacerbation of heart failure.  Was brought in from SNF due to recurrent respiratory failure.  Patient reports that since Wednesday she has been having intermittent hypoxemia with O2 sat in the 80s despite increasing O2 to 6 L at her nursing facility.  She attributes this to limited monitoring of her heart failure status, i.e. checking weights.  She has developed worsening peripheral edema.  However absent this denies any worsening shortness of breath, orthopnea.  No chest pain.  No fevers, chills, cough, cold symptoms.  Has been taking torsemide 20 mg daily as prescribed.   Review of Systems:  ROS complete and negative except as marked above   Allergies  Allergen Reactions   Motrin [Ibuprofen] Other (See Comments)    GI bleed   Ace Inhibitors Cough   Breztri Aerosphere [Budeson-Glycopyrrol-Formoterol] Hypertension   Cipro [Ciprofloxacin Hcl] Other (See Comments)    Avoid fluoroquinolones due to asc-aortic aneurysm   Levaquin [Levofloxacin] Other (See Comments)    Avoid fluoroquinolones due to asc-aortic aneurysm   Nsaids Nausea And Vomiting and Other (See Comments)    Hx of GI bleed    Prior to Admission medications   Medication Sig Start Date End Date Taking? Authorizing Provider  albuterol  (VENTOLIN HFA) 108 (90 Base) MCG/ACT inhaler Inhale 2 puffs into the lungs every 6 (six) hours as needed for wheezing or shortness of breath. Patient not taking: Reported on 03/08/2023 07/24/22   [name], MD  Amino Acids-Protein Hydrolys (PRO-STAT) LIQD Take 30 mLs by mouth daily.    [provider]  aspirin EC 81 MG tablet Take 1 tablet (81 mg total) by mouth daily. Swallow whole. 03/29/21   [name], MD  atorvastatin (LIPITOR) 40 MG tablet Take 40 mg by mouth at bedtime.    [provider]  benzonatate (TESSALON) 200 MG capsule Take 200 mg by mouth in the morning, at noon, and at bedtime.    [provider]  Cholecalciferol (VITAMIN D-3 PO) Take 1 capsule by mouth daily.    [provider]  dextromethorphan (DELSYM) 30 MG/5ML liquid Take 2.5 mLs by mouth 2 (two) times daily.    [provider]  docusate sodium (COLACE) 100 MG capsule Take 1 capsule (100 mg total) by mouth daily. 01/04/23   [name], DO  ferrous sulfate 325 (65 FE) MG tablet Take 325 mg by mouth daily.    [provider]  guaiFENesin (MUCINEX) 600 MG 12 hr tablet Take 600 mg by mouth 2 (two) times daily.    [provider]  Ipratropium-Albuterol (COMBIVENT) 20-100 MCG/ACT AERS respimat Inhale 1 puff into the lungs in the morning and at bedtime.    [provider]  ipratropium-albuterol (DUONEB) 0.5-2.5 (3) MG/3ML SOLN Take 3 mLs by nebulization in  the morning, at noon, and at bedtime.    [provider]  lidocaine (ASPERCREME LIDOCAINE) 4 % Place 1 patch onto the skin daily.    [provider]  melatonin 3 MG TABS tablet Take 6 mg by mouth at bedtime.    [provider]  midodrine (PROAMATINE) 10 MG tablet Take 1 tablet (10 mg total) by mouth 3 (three) times daily with meals. 03/17/23   [name], MD  mometasone-formoterol (DULERA) 100-5 MCG/ACT AERO Inhale 2 puffs into the lungs 2 (two) times daily.    [provider]  montelukast (SINGULAIR) 10 MG tablet Take 1 tablet (10 mg total) by mouth daily. 08/01/21   [name], [name], MD  ondansetron (ZOFRAN) 4 MG tablet Take 4 mg by mouth every 6 (six) hours as needed for nausea or vomiting.    [provider]  oxyCODONE-acetaminophen (PERCOCET/ROXICET) 5-325 MG tablet Take 1 tablet by mouth every 8 (eight) hours as needed for up to 6 doses for moderate pain (pain score 4-6) or severe pain (pain score 7-10). 03/17/23   [name], MD  pantoprazole (PROTONIX) 40 MG tablet Take 40 mg by mouth in the morning and at bedtime.    [provider]  PENTASA 500 MG CR capsule Take 1,000 mg by mouth in the morning and at bedtime.    [provider]  sodium chloride (OCEAN) 0.65 % SOLN nasal spray Place 1 spray into both nostrils in the morning, at noon, and at bedtime.    [provider]  torsemide (DEMADEX) 20 MG tablet Take 1 tablet (20 mg total) by mouth daily. 03/18/23   [name], MD    Past Medical History:  Diagnosis Date   AAA (abdominal aortic aneurysm) (HCC)    CHF (congestive heart failure) (HCC)    Crohn's disease (HCC)    Diverticulitis    Diverticulosis    GERD (gastroesophageal reflux disease)    GI bleed    Hypertension    OSA (obstructive sleep apnea)    Pacemaker    Thyroid nodule     Past Surgical History:  Procedure Laterality Date   CHOLECYSTECTOMY     COLONOSCOPY WITH PROPOFOL N/A 06/01/2015   Procedure: COLONOSCOPY WITH PROPOFOL;  Surgeon: Charlott Rakes, MD;  Location: [facility] ENDOSCOPY;  Service: Endoscopy;  Laterality: N/A;   CORONARY ANGIOPLASTY WITH STENT PLACEMENT     ESOPHAGOGASTRODUODENOSCOPY (EGD) WITH PROPOFOL N/A 06/01/2015   Procedure: ESOPHAGOGASTRODUODENOSCOPY (EGD) WITH PROPOFOL;  Surgeon: Charlott Rakes, MD;  Location: [facility] ENDOSCOPY;  Service: Endoscopy;  Laterality: N/A;   GIVENS CAPSULE STUDY N/A 06/03/2015   Procedure: GIVENS CAPSULE STUDY;  Surgeon: Charlott Rakes, MD;   Location: [facility] ENDOSCOPY;  Service: Endoscopy;  Laterality: N/A;   INTRAMEDULLARY (IM) NAIL INTERTROCHANTERIC Right 10/30/2022   Procedure: INTRAMEDULLARY nailing of right femur;  Surgeon: Joen Laura, MD;  Location: [facility] OR;  Service: Orthopedics;  Laterality: Right;   IR IVC FILTER PLMT / S&I /IMG GUID/MOD SED  02/11/2023   PACEMAKER PLACEMENT  2015     reports that she has quit smoking. She has never used smokeless tobacco. She reports that she does not drink alcohol and does not use drugs.  Family History  Problem Relation Age of Onset   CVA Mother    Lung cancer Father    Colon cancer Neg Hx      Physical Exam: Vitals:   04/07/23 2000 04/07/23 2040 04/07/23 2300 04/07/23 2305  BP: 120/68 133/80  91/65  Pulse: 85 (!)  101 95 100  Resp: 17 (!) 24 13 17   Temp:  97.8 F (36.6 C)    TempSrc:  Oral    SpO2: 95% 90% 96% 97%    Gen: Awake, alert, chronically ill-appearing CV: Regular, soft S1, S2, 1/6 SEM at LLSB Resp: Normal WOB, fine rales in the bases and slight wheezes in upper fields.  Abd: Flat, normoactive, nontender MSK: There is 1+ anasarca, dependent edema 2+ extending up to presacral region  Skin: Cool distally in the upper and lower extremities.  Per RN there is a stage III pressure ulcer over the sacrum.  Scattered ecchymosis Neuro: Alert and interactive, fully oriented Psych: euthymic, appropriate    Data review:   Labs reviewed, notable for:   K3.1, bicarb 36, creatinine 1.1 BNP 2673 High-sensitivity troponin 103 -> repeat pending WBC 7  Micro:  Results for orders placed or performed during the hospital encounter of 03/07/23  Resp panel by RT-PCR (RSV, Flu A&B, Covid) Anterior Nasal Swab     Status: None   Collection Time: 03/07/23 11:27 AM   Specimen: Anterior Nasal Swab  Result Value Ref Range Status   SARS Coronavirus 2 by RT PCR NEGATIVE NEGATIVE Final   Influenza A by PCR NEGATIVE NEGATIVE Final   Influenza B by PCR NEGATIVE NEGATIVE Final     Comment: (NOTE) The Xpert Xpress SARS-CoV-2/FLU/RSV plus assay is intended as an aid in the diagnosis of influenza from Nasopharyngeal swab specimens and should not be used as a sole basis for treatment. Nasal washings and aspirates are unacceptable for Xpert Xpress SARS-CoV-2/FLU/RSV testing.  Fact Sheet for Patients: BloggerCourse.com  Fact Sheet for Healthcare Providers: SeriousBroker.it  This test is not yet approved or cleared by the [place] FDA and has been authorized for detection and/or diagnosis of SARS-CoV-2 by FDA under an Emergency Use Authorization (EUA). This EUA will remain in effect (meaning this test can be used) for the duration of the COVID-19 declaration under Section 564(b)(1) of the Act, 21 U.S.C. section 360bbb-3(b)(1), unless the authorization is terminated or revoked.     Resp Syncytial Virus by PCR NEGATIVE NEGATIVE Final    Comment: (NOTE) Fact Sheet for Patients: BloggerCourse.com  Fact Sheet for Healthcare Providers: SeriousBroker.it  This test is not yet approved or cleared by the [place] FDA and has been authorized for detection and/or diagnosis of SARS-CoV-2 by FDA under an Emergency Use Authorization (EUA). This EUA will remain in effect (meaning this test can be used) for the duration of the COVID-19 declaration under Section 564(b)(1) of the Act, 21 U.S.C. section 360bbb-3(b)(1), unless the authorization is terminated or revoked.  Performed at [facility], [address], [place], [place] [address]     Imaging reviewed:  [facility] Hip [place] or [place] Pelvis 1 View Left Result Date: 04/07/2023 CLINICAL DATA:  LEFT hip pain EXAM: DG HIP (WITH OR WITHOUT PELVIS) 1V PORT LEFT COMPARISON:  December 30, 2022 FINDINGS: Osteopenia. Status post ORIF of the RIGHT femur with revisualization of a displaced RIGHT lesser trochanter. No  acute fracture or dislocation. Moderate joint space narrowing and mild osteophyte formation of the LEFT hip. No area of erosion or osseous destruction. No unexpected radiopaque foreign body. Diverticulosis. Sacrum is obscured by overlapping bowel contents. Degenerative changes of the lower lumbar spine. The filter. IMPRESSION: Moderate degenerative changes of the LEFT hip. If there is a persistent clinical concern for nondisplaced hip or pelvic fracture, recommend dedicated pelvic CT or MRI. Electronically Signed  By: Meda Klinefelter M.D.   On: 04/07/2023 17:42   DG Chest Port 1 View Result Date: 04/07/2023 CLINICAL DATA:  Shortness of breath EXAM: PORTABLE CHEST 1 VIEW COMPARISON:  March 07, 2023 FINDINGS: The cardiomediastinal silhouette is unchanged in contour.Atherosclerotic calcifications of the aorta. LEFT chest cardiac pacing device. Favored trace bilateral pleural effusions. No pneumothorax. Patchy bibasilar reticulonodular opacities with peribronchial cuffing and mild interstitial prominence, similar in comparison to prior. IMPRESSION: 1. Similar appearance of patchy bibasilar reticulonodular opacities with peribronchial cuffing and mild interstitial prominence. Findings are favored to reflect a combination of atelectasis and edema. Superimposed infection could appear similar. 2. Favored trace bilateral pleural effusions. Electronically Signed   By: Meda Klinefelter M.D.   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  Was brought in from SNF due to recurrent respiratory failure likely secondary to HF exacerbation   Acute on chronic heart failure, severe RV failure, LV preserved EF Low output HF  Acute on chronic hypoxic respiratory failure Recent hospitalization last month for the same.  Last TTE 2/' 25 with LVEF 50 to 55%, D-shaped septum with RV volume overload, severely decreased RV systolic function.  Dry weight appears to be around 188 LB = 84.5 kg.  Most recent diuretic dosing torsemide 20 mg. requiring midodrine for blood pressure support since last admission.  Minimal related heart failure symptoms other than edema.  However developing hypoxia with sats in 80s on 4-6L (home is 4L).  Initially requiring nonrebreather by EMS, now down to 6 L.  Exam with anasarca as well as dependent edema, cool extremities.  BNP 2673 up from prior, high-sensitivity troponin 103 with repeat pending.  Chest x-ray with similar patchy bibasilar and reticular nodular opacities, peribronchial cuffing and interstitial prominence, trace pleural effusions.  Overall with poor prognosis due to the severity of her RV failure, and discussed her associated myocardial injury and concern for cycle of myocardial injury with worsening RV output.  Exam with cool extremities suggestive of poor cardiac output which may also contribute to her fluid retention.  She is aware of her end-stage heart failure and seems to value symptom relief and fluid management, understands that there is no definitive treatment and prognosis is poor. - S/p Lasix 40 mg IV in the ED, tentatively scheduled for 40 mg IV for now - Reduce midodrine to 5 mg 3 times daily, consider further weaning to improve forward flow as blood pressure tolerates, considering her low output heart failure  - Strict I's and O's and daily  weights, will need closer tracking of weights at her nursing facility for fluid management. - No need for repeat TTE as this is recently been done.  However if she begins to develop hypoxia not responsive to oxygen supplementation may be reasonable to repeat with bubble to evaluate for shunt physiology with her severe RV failure. - Heart failure navigator/TOC  consult - Continue goals of care discussions, she is aware of her poor prognosis.  Would be a candidate for hospice care if she is interested although we did not discuss this on initial interview.  Acute on chronic myocardial injury Appears developing chronically elevated troponin, over the past 2 months has been elevated.  Slightly worse this admission with high-sensitivity Trop 103 -> 82.  EKG with no acute ischemic changes.  No symptoms of chest pain or anginal equivalent.  This is presumed demand ischemia in the setting of RV overload and low output heart failure. - Management directed at volume status per above - Continue home aspirin, atorvastatin.  Stage III sacral pressure ulcer, present on admission - Wound care consult  Hypokalemia, mild, asymptomatic Repleted  Chronic medical problems: CAD with history of RCA PCI/HLD: Continue aspirin, statin. Heart block with history of pacemaker: Noted COPD with CHRF on 4 L O2: See above about acute on chronic hypoxic respiratory failure.  No component of COPD exacerbation.  DuoNeb every 6 hours scheduled, albuterol every 4 hours as needed, continue home Dulera, singulair  History DVT/PE, IVC filter: Not candidate for anticoagulation due to history of GI bleeding CKD stage IIIa: Baseline Cr near 1 - 1.2  Crohn's disease: Noted Anemia of chronic disease: Continue home iron GERD: Continue home PPI.   There is no height or weight on file to calculate BMI.    DVT prophylaxis:  Lovenox Code Status:  DNR/DNI(Do NOT Intubate); confirmed with patient Diet:  Diet Orders (From admission,  onward)     Start     Ordered   04/07/23 1936  Diet 2 gram sodium Room service appropriate? Yes; Fluid consistency: Thin  Diet effective now       Question Answer Comment  Room service appropriate? Yes   Fluid consistency: Thin      04/07/23 1935           Family Communication: None Consults: None Admission status:   Inpatient, Telemetry bed  Severity of Illness: The appropriate patient status for this patient is INPATIENT. Inpatient status is judged to be reasonable and necessary in order to provide the required intensity of service to ensure the patient's safety. The patient's presenting symptoms, physical exam findings, and initial radiographic and laboratory data in the context of their chronic comorbidities is felt to place them at high risk for further clinical deterioration. Furthermore, it is not anticipated that the patient will be medically stable for discharge from the hospital within 2 midnights of admission.   * I certify that at the point of admission it is my clinical judgment that the patient will require inpatient hospital care spanning beyond 2 midnights from the point of admission due to high intensity of service, high risk for further deterioration and high frequency of surveillance required.*   Dolly Rias, MD [facility]  How to contact the [facility] Attending or Consulting provider 7A - 7P or covering provider during after hours 7P -7A, for this patient.  Check the care team in [facility] and look for a) attending/consulting [facility] provider listed and b) the [facility] team listed Log into [email or web address] and use [place]'s universal password to access. If you do not have the password, please contact the hospital operator. Locate the [facility] provider you are looking for under [facility] and page to a number that you can be directly reached. If you still have difficulty reaching the provider, please page the [facility] (Director on Call) for the Hospitalists listed on amion  for assistance.  04/07/2023, 11:37 PM

## 2023-04-07 NOTE — ED Provider Notes (Signed)
 [place] EMERGENCY DEPARTMENT AT [facility] Provider Note   CSN: [id number] Arrival date & time: 04/07/23  1631     History  Chief Complaint  Patient presents with   Shortness of Breath    Felicia Benson is a 89 y.o. female.  89 yo F with a chief complaint of difficulty breathing.  Going on for the past couple days.  Patient is normally on 3 to 4 L of oxygen constantly and she was on that but unable to get her oxygen up and so decided come into the ED for evaluation.   Shortness of Breath      Home Medications Prior to Admission medications   Medication Sig Start Date End Date Taking? Authorizing Provider  albuterol (VENTOLIN HFA) 108 (90 Base) MCG/ACT inhaler Inhale 2 puffs into the lungs every 6 (six) hours as needed for wheezing or shortness of breath. Patient not taking: Reported on 03/08/2023 07/24/22   [name], MD  Amino Acids-Protein Hydrolys (PRO-STAT) LIQD Take 30 mLs by mouth daily.    [provider]  aspirin EC 81 MG tablet Take 1 tablet (81 mg total) by mouth daily. Swallow whole. 03/29/21   [name], MD  atorvastatin (LIPITOR) 40 MG tablet Take 40 mg by mouth at bedtime.    [provider]  benzonatate (TESSALON) 200 MG capsule Take 200 mg by mouth in the morning, at noon, and at bedtime.    [provider]  Cholecalciferol (VITAMIN D-3 PO) Take 1 capsule by mouth daily.    [provider]  dextromethorphan (DELSYM) 30 MG/5ML liquid Take 2.5 mLs by mouth 2 (two) times daily.    [provider]  docusate sodium (COLACE) 100 MG capsule Take 1 capsule (100 mg total) by mouth daily. 01/04/23   [name], DO  ferrous sulfate 325 (65 FE) MG tablet Take 325 mg by mouth daily.    [provider]  guaiFENesin (MUCINEX) 600 MG 12 hr tablet Take 600 mg by mouth 2 (two) times daily.    [provider]  Ipratropium-Albuterol (COMBIVENT) 20-100 MCG/ACT AERS respimat Inhale 1 puff into  the lungs in the morning and at bedtime.    [provider]  ipratropium-albuterol (DUONEB) 0.5-2.5 (3) MG/3ML SOLN Take 3 mLs by nebulization in the morning, at noon, and at bedtime.    [provider]  lidocaine (ASPERCREME LIDOCAINE) 4 % Place 1 patch onto the skin daily.    [provider]  melatonin 3 MG TABS tablet Take 6 mg by mouth at bedtime.    [provider]  midodrine (PROAMATINE) 10 MG tablet Take 1 tablet (10 mg total) by mouth 3 (three) times daily with meals. 03/17/23   [name], MD  mometasone-formoterol (DULERA) 100-5 MCG/ACT AERO Inhale 2 puffs into the lungs 2 (two) times daily.    [provider]  montelukast (SINGULAIR) 10 MG tablet Take 1 tablet (10 mg total) by mouth daily. 08/01/21   [name], [name], MD  ondansetron (ZOFRAN) 4 MG tablet Take 4 mg by mouth every 6 (six) hours as needed for nausea or vomiting.    [provider]  oxyCODONE-acetaminophen (PERCOCET/ROXICET) 5-325 MG tablet Take 1 tablet by mouth every 8 (eight) hours as needed for up to 6 doses for moderate pain (pain score 4-6) or severe pain (pain score 7-10). 03/17/23   [name], MD  pantoprazole (PROTONIX) 40 MG tablet Take 40 mg by mouth in the morning and at bedtime.  [provider]  PENTASA 500 MG CR capsule Take 1,000 mg by mouth in the morning and at bedtime.    [provider]  sodium chloride (OCEAN) 0.65 % SOLN nasal spray Place 1 spray into both nostrils in the morning, at noon, and at bedtime.    [provider]  torsemide (DEMADEX) 20 MG tablet Take 1 tablet (20 mg total) by mouth daily. 03/18/23   [name], MD      Allergies    Motrin [ibuprofen], Ace inhibitors, Breztri aerosphere [budeson-glycopyrrol-formoterol], Cipro [ciprofloxacin hcl], Levaquin [levofloxacin], and Nsaids    Review of Systems   Review of Systems  Respiratory:  Positive for shortness of breath.     Physical Exam Updated Vital  Signs BP 132/87   Pulse (!) 101   Temp 98.6 F (37 C) (Oral)   Resp (!) 22   SpO2 98%  Physical Exam Vitals and nursing note reviewed.  Constitutional:      General: She is not in acute distress.    Appearance: She is well-developed. She is not diaphoretic.  HENT:     Head: Normocephalic and atraumatic.  Eyes:     Pupils: Pupils are equal, round, and reactive to light.  Cardiovascular:     Rate and Rhythm: Normal rate and regular rhythm.     Heart sounds: No murmur heard.    No friction rub. No gallop.  Pulmonary:     Effort: Pulmonary effort is normal.     Breath sounds: Examination of the right-lower field reveals rales. Examination of the left-lower field reveals rales. Rales present. No wheezing.  Abdominal:     General: There is no distension.     Palpations: Abdomen is soft.     Tenderness: There is no abdominal tenderness.  Musculoskeletal:        General: No tenderness.     Cervical back: Normal range of motion and neck supple.  Skin:    General: Skin is warm and dry.  Neurological:     Mental Status: She is alert and oriented to person, place, and time.  Psychiatric:        Behavior: Behavior normal.     ED Results / Procedures / Treatments   Labs (all labs ordered are listed, but only abnormal results are displayed) Labs Reviewed  CBC WITH DIFFERENTIAL/PLATELET - Abnormal; Notable for the following components:      Result Value   RBC 3.73 (*)    Hemoglobin 11.4 (*)    RDW 21.2 (*)    Lymphs Abs 0.5 (*)    All other components within normal limits  COMPREHENSIVE METABOLIC PANEL WITH GFR - Abnormal; Notable for the following components:   Potassium 3.1 (*)    Chloride 93 (*)    CO2 36 (*)    Glucose, Bld 109 (*)    BUN 35 (*)    Creatinine, Ser 1.18 (*)    Calcium 8.6 (*)    Albumin 2.7 (*)    Total Bilirubin 1.4 (*)    GFR, Estimated 44 (*)    All other components within normal limits  BRAIN NATRIURETIC PEPTIDE - Abnormal; Notable for the  following components:   B Natriuretic Peptide 2,673.6 (*)    All other components within normal limits  I-STAT CHEM 8, ED - Abnormal; Notable for the following components:   Potassium 3.1 (*)    Chloride 93 (*)    BUN 35 (*)    Creatinine, Ser 1.30 (*)  Glucose, Bld 106 (*)    Calcium, Ion 1.06 (*)    TCO2 36 (*)    All other components within normal limits  TROPONIN I (HIGH SENSITIVITY) - Abnormal; Notable for the following components:   Troponin I (High Sensitivity) 103 (*)    All other components within normal limits  RESP PANEL BY RT-PCR (RSV, FLU A&B, COVID)  RVPGX2  MAGNESIUM  BASIC METABOLIC PANEL WITH GFR  CBC  MAGNESIUM  PHOSPHORUS    EKG EKG Interpretation Date/Time:  Sunday April 07 2023 16:45:29 EDT Ventricular Rate:  102 PR Interval:  176 QRS Duration:  151 QT Interval:  380 QTC Calculation: 495 R Axis:   262  Text Interpretation: Sinus or ectopic atrial tachycardia Atrial premature complex Nonspecific IVCD with LAD No significant change since last tracing Confirmed by Melene Plan [phone]) on 04/07/2023 5:14:36 PM  Radiology DG Hip [place] or [place] Pelvis 1 View Left Result Date: 04/07/2023 CLINICAL DATA:  LEFT hip pain EXAM: DG HIP (WITH OR WITHOUT PELVIS) 1V PORT LEFT COMPARISON:  December 30, 2022 FINDINGS: Osteopenia. Status post ORIF of the RIGHT femur with revisualization of a displaced RIGHT lesser trochanter. No acute fracture or dislocation. Moderate joint space narrowing and mild osteophyte formation of the LEFT hip. No area of erosion or osseous destruction. No unexpected radiopaque foreign body. Diverticulosis. Sacrum is obscured by overlapping bowel contents. Degenerative changes of the lower lumbar spine. The filter. IMPRESSION: Moderate degenerative changes of the LEFT hip. If there is a persistent clinical concern for nondisplaced hip or pelvic fracture, recommend dedicated pelvic CT or MRI. Electronically Signed   By: Meda Klinefelter M.D.   On:  04/07/2023 17:42   DG Chest Port 1 View Result Date: 04/07/2023 CLINICAL DATA:  Shortness of breath EXAM: PORTABLE CHEST 1 VIEW COMPARISON:  March 07, 2023 FINDINGS: The cardiomediastinal silhouette is unchanged in contour.Atherosclerotic calcifications of the aorta. LEFT chest cardiac pacing device. Favored trace bilateral pleural effusions. No pneumothorax. Patchy bibasilar reticulonodular opacities with peribronchial cuffing and mild interstitial prominence, similar in comparison to prior. IMPRESSION: 1. Similar appearance of patchy bibasilar reticulonodular opacities with peribronchial cuffing and mild interstitial prominence. Findings are favored to reflect a combination of atelectasis and edema. Superimposed infection could appear similar. 2. Favored trace bilateral pleural effusions. Electronically Signed   By: Meda Klinefelter M.D.   On: 04/07/2023 17:40    Procedures .Critical Care  Performed by: Melene Plan, DO Authorized by: Melene Plan, DO   Critical care provider statement:    Critical care time (minutes):  35   Critical care time was exclusive of:  Separately billable procedures and treating other patients   Critical care was time spent personally by me on the following activities:  Development of treatment plan with patient or surrogate, discussions with consultants, evaluation of patient's response to treatment, examination of patient, ordering and review of laboratory studies, ordering and review of radiographic studies, ordering and performing treatments and interventions, pulse oximetry, re-evaluation of patient's condition and review of old charts   Care discussed with: admitting provider       Medications Ordered in ED Medications  potassium chloride (KLOR-CON) packet 60 mEq (60 mEq Oral Given 04/07/23 1832)  enoxaparin (LOVENOX) injection 40 mg (has no administration in time range)  sodium chloride flush (NS) 0.9 % injection 3 mL (has no administration in time range)   melatonin tablet 6 mg (has no administration in time range)  acetaminophen (TYLENOL) tablet 1,000 mg (has no administration  in time range)  ondansetron (ZOFRAN) injection 4 mg (has no administration in time range)  polyethylene glycol (MIRALAX / GLYCOLAX) packet 17 g (has no administration in time range)  albuterol (PROVENTIL) (2.5 MG/3ML) 0.083% nebulizer solution 2.5 mg (has no administration in time range)  ipratropium-albuterol (DUONEB) 0.5-2.5 (3) MG/3ML nebulizer solution 3 mL (has no administration in time range)  aspirin EC tablet 81 mg (has no administration in time range)  atorvastatin (LIPITOR) tablet 40 mg (has no administration in time range)  ferrous sulfate tablet 325 mg (has no administration in time range)  midodrine (PROAMATINE) tablet 10 mg (has no administration in time range)  mometasone-formoterol (DULERA) 100-5 MCG/ACT inhaler 2 puff (has no administration in time range)  montelukast (SINGULAIR) tablet 10 mg (has no administration in time range)  oxyCODONE-acetaminophen (PERCOCET/ROXICET) 5-325 MG per tablet 1 tablet (has no administration in time range)  pantoprazole (PROTONIX) EC tablet 40 mg (has no administration in time range)  furosemide (LASIX) injection 40 mg (has no administration in time range)  magnesium oxide (MAG-OX) tablet 800 mg (800 mg Oral Given 04/07/23 1832)  furosemide (LASIX) injection 40 mg (40 mg Intravenous Given 04/07/23 1832)  acetaminophen (TYLENOL) tablet 1,000 mg (1,000 mg Oral Given 04/07/23 1911)  oxyCODONE (Oxy IR/ROXICODONE) immediate release tablet 5 mg (5 mg Oral Given 04/07/23 1911)    ED Course/ Medical Decision Making/ A&P                                 Medical Decision Making Amount and/or Complexity of Data Reviewed Labs: ordered. Radiology: ordered.  Risk OTC drugs. Prescription drug management. Decision regarding hospitalization.   89 yo F with a cc of shortness of breath.  Patient has a history of CHF and was  recently hospitalized for the same.  For the past 2 or 3 days she has had difficulty breathing.  Mild lower extremity edema.  On exam the patient has lower extremity edema Rales at the bases JVD to mid neck.  Will obtain a laboratory evaluation chest x-ray reassess.  BNP elevated, trop elevated.  CXR independently interpreted by me with concern for edema.  No acute anemia.  Potassium is mildly low will replenish potassium and magnesium.  Bolus dose of Lasix.  Discussed with hospitalist for admission.  The patients results and plan were reviewed and discussed.   Any x-rays performed were independently reviewed by myself.   Differential diagnosis were considered with the presenting HPI.  Medications  potassium chloride (KLOR-CON) packet 60 mEq (60 mEq Oral Given 04/07/23 1832)  enoxaparin (LOVENOX) injection 40 mg (has no administration in time range)  sodium chloride flush (NS) 0.9 % injection 3 mL (has no administration in time range)  melatonin tablet 6 mg (has no administration in time range)  acetaminophen (TYLENOL) tablet 1,000 mg (has no administration in time range)  ondansetron (ZOFRAN) injection 4 mg (has no administration in time range)  polyethylene glycol (MIRALAX / GLYCOLAX) packet 17 g (has no administration in time range)  albuterol (PROVENTIL) (2.5 MG/3ML) 0.083% nebulizer solution 2.5 mg (has no administration in time range)  ipratropium-albuterol (DUONEB) 0.5-2.5 (3) MG/3ML nebulizer solution 3 mL (has no administration in time range)  aspirin EC tablet 81 mg (has no administration in time range)  atorvastatin (LIPITOR) tablet 40 mg (has no administration in time range)  ferrous sulfate tablet 325 mg (has no administration in time range)  midodrine (PROAMATINE) tablet 10 mg (  has no administration in time range)  mometasone-formoterol (DULERA) 100-5 MCG/ACT inhaler 2 puff (has no administration in time range)  montelukast (SINGULAIR) tablet 10 mg (has no administration in  time range)  oxyCODONE-acetaminophen (PERCOCET/ROXICET) 5-325 MG per tablet 1 tablet (has no administration in time range)  pantoprazole (PROTONIX) EC tablet 40 mg (has no administration in time range)  furosemide (LASIX) injection 40 mg (has no administration in time range)  magnesium oxide (MAG-OX) tablet 800 mg (800 mg Oral Given 04/07/23 1832)  furosemide (LASIX) injection 40 mg (40 mg Intravenous Given 04/07/23 1832)  acetaminophen (TYLENOL) tablet 1,000 mg (1,000 mg Oral Given 04/07/23 1911)  oxyCODONE (Oxy IR/ROXICODONE) immediate release tablet 5 mg (5 mg Oral Given 04/07/23 1911)    Vitals:   04/07/23 1745 04/07/23 1800 04/07/23 1815 04/07/23 1900  BP: 122/79 115/78 130/85 132/87  Pulse: 95 94 96 (!) 101  Resp: (!) 22 17 (!) 25 (!) 22  Temp:    98.6 F (37 C)  TempSrc:    Oral  SpO2: 95% 94% 95% 98%    Final diagnoses:  Acute on chronic systolic congestive heart failure (HCC)    Admission/ observation were discussed with the admitting physician, patient and/or family and they are comfortable with the plan.          Final Clinical Impression(s) / ED Diagnoses Final diagnoses:  Acute on chronic systolic congestive heart failure [facility])    Rx / DC Orders ED Discharge Orders     None         Melene Plan, DO 04/07/23 1939

## 2023-04-07 NOTE — ED Triage Notes (Signed)
 Pt BIB by EMS from [facility] with reports of SHOB x 2 days. Bilateral rales noted. 85% on baseline 3-4 L. Hx of CHF.  EMS VS: BP 150/96 HR 110s RR 22

## 2023-04-07 NOTE — Plan of Care (Signed)
 Problem: Education: Goal: Knowledge of General Education information will improve Description: Including pain rating scale, medication(s)/side effects and non-pharmacologic comfort measures 04/07/2023 2243 by Rockney Ghee, RN Outcome: Progressing 04/07/2023 2242 by Rockney Ghee, RN Outcome: Not Progressing   Problem: Health Behavior/Discharge Planning: Goal: Ability to manage health-related needs will improve 04/07/2023 2243 by Camile Esters, Levonne Lapping, RN Outcome: Progressing 04/07/2023 2242 by Rockney Ghee, RN Outcome: Not Progressing   Problem: Clinical Measurements: Goal: Ability to maintain clinical measurements within normal limits will improve 04/07/2023 2243 by Taleeyah Bora, Levonne Lapping, RN Outcome: Progressing 04/07/2023 2242 by Rockney Ghee, RN Outcome: Not Progressing Goal: Will remain free from infection 04/07/2023 2243 by Rockney Ghee, RN Outcome: Progressing 04/07/2023 2242 by Rockney Ghee, RN Outcome: Not Progressing Goal: Diagnostic test results will improve 04/07/2023 2243 by Daylee Delahoz, Levonne Lapping, RN Outcome: Progressing 04/07/2023 2242 by Rockney Ghee, RN Outcome: Not Progressing Goal: Respiratory complications will improve 04/07/2023 2243 by Rockney Ghee, RN Outcome: Progressing 04/07/2023 2242 by Rockney Ghee, RN Outcome: Not Progressing Goal: Cardiovascular complication will be avoided 04/07/2023 2243 by Rockney Ghee, RN Outcome: Progressing 04/07/2023 2242 by Rockney Ghee, RN Outcome: Not Progressing   Problem: Activity: Goal: Risk for activity intolerance will decrease 04/07/2023 2243 by Prestyn Stanco, Levonne Lapping, RN Outcome: Progressing 04/07/2023 2242 by Rockney Ghee, RN Outcome: Not Progressing   Problem: Nutrition: Goal: Adequate nutrition will be maintained 04/07/2023 2243 by Rockney Ghee, RN Outcome: Progressing 04/07/2023 2242 by Rockney Ghee, RN Outcome: Not Progressing   Problem: Coping: Goal: Level of anxiety will decrease 04/07/2023 2243 by Cletus Mehlhoff, Levonne Lapping, RN Outcome: Progressing 04/07/2023 2242 by Rockney Ghee, RN Outcome: Not Progressing   Problem: Elimination: Goal: Will not experience complications related to bowel motility 04/07/2023 2243 by Rockney Ghee, RN Outcome: Progressing 04/07/2023 2242 by Rockney Ghee, RN Outcome: Not Progressing Goal: Will not experience complications related to urinary retention 04/07/2023 2243 by Kaevon Cotta, Levonne Lapping, RN Outcome: Progressing 04/07/2023 2242 by Rockney Ghee, RN Outcome: Not Progressing   Problem: Pain Managment: Goal: General experience of comfort will improve and/or be controlled 04/07/2023 2243 by Tristian Bouska, Levonne Lapping, RN Outcome: Progressing 04/07/2023 2242 by Rockney Ghee, RN Outcome: Not Progressing   Problem: Safety: Goal: Ability to remain free from injury will improve 04/07/2023 2243 by Buddie Marston, Levonne Lapping, RN Outcome: Progressing 04/07/2023 2242 by Rockney Ghee, RN Outcome: Not Progressing   Problem: Skin Integrity: Goal: Risk for impaired skin integrity will decrease 04/07/2023 2243 by Clancy Leiner, Levonne Lapping, RN Outcome: Progressing 04/07/2023 2242 by Rockney Ghee, RN Outcome: Not Progressing   Problem: Education: Goal: Ability to demonstrate management of disease process will improve 04/07/2023 2243 by Rockney Ghee, RN Outcome: Progressing 04/07/2023 2242 by Rockney Ghee, RN Outcome: Not Progressing Goal: Ability to verbalize understanding of medication therapies will improve 04/07/2023 2243 by Rockney Ghee, RN Outcome: Progressing 04/07/2023 2242 by Rockney Ghee, RN Outcome: Not Progressing Goal: Individualized Educational Video(s) 04/07/2023 2243 by Rockney Ghee, RN Outcome: Progressing 04/07/2023 2242 by Rockney Ghee, RN Outcome: Not Progressing   Problem: Activity: Goal: Capacity to carry out activities will improve 04/07/2023 2243 by Christipher Rieger, Levonne Lapping, RN Outcome: Progressing 04/07/2023 2242 by Rockney Ghee, RN Outcome: Not Progressing   Problem: Cardiac: Goal: Ability to achieve and maintain adequate cardiopulmonary perfusion will improve 04/07/2023 2243 by Rockney Ghee, RN Outcome: Progressing 04/07/2023 2242 by Carma Lair  M, RN Outcome: Not Progressing

## 2023-04-08 DIAGNOSIS — E876 Hypokalemia: Secondary | ICD-10-CM | POA: Insufficient documentation

## 2023-04-08 DIAGNOSIS — Z7189 Other specified counseling: Secondary | ICD-10-CM

## 2023-04-08 DIAGNOSIS — I5A Non-ischemic myocardial injury (non-traumatic): Secondary | ICD-10-CM | POA: Insufficient documentation

## 2023-04-08 DIAGNOSIS — L98429 Non-pressure chronic ulcer of back with unspecified severity: Secondary | ICD-10-CM | POA: Insufficient documentation

## 2023-04-08 DIAGNOSIS — I5043 Acute on chronic combined systolic (congestive) and diastolic (congestive) heart failure: Secondary | ICD-10-CM | POA: Diagnosis not present

## 2023-04-08 LAB — BASIC METABOLIC PANEL WITH GFR
Anion gap: 9 (ref 5–15)
BUN: 41 mg/dL — ABNORMAL HIGH (ref 8–23)
CO2: 30 mmol/L (ref 22–32)
Calcium: 8.1 mg/dL — ABNORMAL LOW (ref 8.9–10.3)
Chloride: 99 mmol/L (ref 98–111)
Creatinine, Ser: 1.24 mg/dL — ABNORMAL HIGH (ref 0.44–1.00)
GFR, Estimated: 41 mL/min — ABNORMAL LOW (ref >60)
Glucose, Bld: 105 mg/dL — ABNORMAL HIGH (ref 70–99)
Potassium: 4.9 mmol/L (ref 3.5–5.1)
Sodium: 138 mmol/L (ref 135–145)

## 2023-04-08 LAB — CBC
HCT: 34.2 % — ABNORMAL LOW (ref 36.0–46.0)
Hemoglobin: 10.5 g/dL — ABNORMAL LOW (ref 12.0–15.0)
MCH: 30.3 pg (ref 26.0–34.0)
MCHC: 30.7 g/dL (ref 30.0–36.0)
MCV: 98.8 fL (ref 80.0–100.0)
Platelets: 234 10*3/uL (ref 150–400)
RBC: 3.46 MIL/uL — ABNORMAL LOW (ref 3.87–5.11)
RDW: 21.4 % — ABNORMAL HIGH (ref 11.5–15.5)
WBC: 7.6 10*3/uL (ref 4.0–10.5)
nRBC: 0 % (ref 0.0–0.2)

## 2023-04-08 LAB — PHOSPHORUS: Phosphorus: 3.8 mg/dL (ref 2.5–4.6)

## 2023-04-08 LAB — MAGNESIUM: Magnesium: 1.8 mg/dL (ref 1.7–2.4)

## 2023-04-08 NOTE — TOC Initial Note (Signed)
 Transition of Care [facility]) - Initial/Assessment Note    Patient Details  Name: Felicia Benson MRN: [id number] Date of Birth: [birth year]  Transition [facility]) CM/SW Contact:    Michaela Corner, LCSWA Phone Number: 04/08/2023, 1:07 PM  Clinical Narrative:    CSW met patient at bedside to follow up on what facility patient resides at, per report given in progression. Patient stated that she came to the hospital from [facility]. Patient stated her daughter, Alvis Lemmings, is working with the facility to transition patient into LTC.   TOC will continue to follow.                Expected Discharge Plan: Skilled Nursing Facility Barriers to Discharge: Continued Medical Work up   Patient Goals and CMS Choice Patient states their goals for this hospitalization and ongoing recovery are:: SNF          Expected Discharge Plan and Services In-house Referral: Clinical Social Work     Living arrangements for the past 2 months: Skilled Nursing Facility                                      Prior Living Arrangements/Services Living arrangements for the past 2 months: Skilled Nursing Facility Lives with:: Facility Resident Patient language and need for interpreter reviewed:: Yes Do you feel safe going back to the place where you live?: Yes      Need for Family Participation in Patient Care: Yes (Comment)     Criminal Activity/Legal Involvement Pertinent to Current Situation/Hospitalization: No - Comment as needed  Activities of Daily Living   ADL Screening (condition at time of admission) Independently performs ADLs?: No Does the patient have a NEW difficulty with bathing/dressing/toileting/self-feeding that is expected to last >3 days?: No Does the patient have a NEW difficulty with getting in/out of bed, walking, or climbing stairs that is expected to last >3 days?: No Does the patient have a NEW difficulty with communication that is expected to last >3 days?: No Is the  patient deaf or have difficulty hearing?: Yes Does the patient have difficulty seeing, even when wearing glasses/contacts?: Yes Does the patient have difficulty concentrating, remembering, or making decisions?: No  Permission Sought/Granted Permission sought to share information with : Facility Medical sales representative, Family Supports Permission granted to share information with : Yes, Verbal Permission Granted  Share Information with NAME: [name]  Permission granted to share info w AGENCY: SNF  Permission granted to share info w Relationship: Daughter  Permission granted to share info w Contact Information: Alvis Lemmings [phone]  Emotional Assessment Appearance:: Appears stated age Attitude/Demeanor/Rapport: Engaged Affect (typically observed): Calm Orientation: : Oriented to Situation, Oriented to  Time, Oriented to Place, Oriented to Self Alcohol / Substance Use: Not Applicable Psych Involvement: No (comment)  Admission diagnosis:  Acute exacerbation of CHF (congestive heart failure) (HCC) [I50.9] Acute on chronic systolic congestive heart failure (HCC) [I50.23] Patient Active Problem List   Diagnosis Date Noted   Myocardial injury 04/08/2023   Sacral ulcer (HCC) 04/08/2023   Hypokalemia 04/08/2023   Goals of care, counseling/discussion 04/08/2023   AKI (acute kidney injury) (HCC) 03/13/2023   Acute on chronic respiratory failure with hypoxia (HCC) 03/08/2023   Elevated troponin 03/08/2023   Acute exacerbation of CHF (congestive heart failure) (HCC) 03/07/2023   Right ventricular failure (HCC) 02/15/2023   Shortness of breath 02/13/2023   DVT (deep venous thrombosis) (HCC)  02/11/2023   Acute pulmonary embolism (HCC) 02/08/2023   Acute respiratory failure with hypoxia (HCC) 02/08/2023   CAP (community acquired pneumonia) 02/07/2023   Filling defect on imaging study 02/07/2023   CKD (chronic kidney disease) 02/07/2023   Pneumonia 02/07/2023   Diverticular hemorrhage 12/28/2022    Ileus, postoperative (HCC) 11/04/2022   Closed right hip fracture (HCC) 10/28/2022   Fall at home, initial encounter 10/28/2022   History of COPD 10/28/2022   Allergic rhinitis 10/28/2022   Anemia of chronic disease 10/28/2022   Chronic rhinitis 08/06/2022   OSA (obstructive sleep apnea) 08/06/2022   Heart failure, acute diastolic (HCC) 07/21/2022   CKD stage 3a, GFR 45-59 ml/min (HCC) 07/21/2022   Thoracic aortic aneurysm (HCC) 07/21/2022   GERD (gastroesophageal reflux disease) 07/21/2022   Pancreatic lesion 07/21/2022   Macular degeneration 07/21/2022   Hypocalcemia 07/21/2022   Insomnia 07/21/2022   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   TIA (transient ischemic attack) 12/31/2020   Acute on chronic diastolic heart failure (HCC) 05/19/2019   Acute CHF (congestive heart failure) (HCC) 05/18/2019   Chronic hypoxic respiratory failure (HCC) 05/18/2019   COPD (chronic obstructive pulmonary disease) (HCC) 05/18/2019   History of cardiac pacemaker 05/18/2019   AAA (abdominal aortic aneurysm) 05/18/2019   Hyperlipidemia 05/18/2019   Abdominal pain in female 05/30/2015   Essential hypertension 05/30/2015   Crohn's disease (HCC) 05/30/2015   Gastrointestinal hemorrhage with melena 05/30/2015   Blood loss anemia 05/30/2015   Hyponatremia 05/30/2015   Kidney disease 05/30/2015   Normocytic anemia 05/30/2015   CAD S/P percutaneous coronary angioplasty 05/30/2015   Acute blood loss anemia 05/30/2015   Heme + stool    PCP:  Patient, No Pcp Per Pharmacy:   [facility] [place], [place] - [address] [place] [address] Phone: [phone] Fax: [phone]  [facility] [address], [place] - [address] [place] [address] Phone: [phone] Fax: [phone]     Social Drivers of Health (SDOH) Social History: SDOH Screenings   Food Insecurity: No Food Insecurity  (04/08/2023)  Housing: Low Risk  (04/08/2023)  Transportation Needs: No Transportation Needs (04/08/2023)  Utilities: Not At Risk (04/08/2023)  Social Connections: Moderately Isolated (04/08/2023)  Tobacco Use: Medium Risk (04/07/2023)   SDOH Interventions:     Readmission Risk Interventions    07/24/2022    2:49 PM  Readmission Risk Prevention Plan  Transportation Screening Complete  PCP or Specialist Appt within 5-7 Days Complete  Home Care Screening Complete  Medication Review (RN CM) Complete

## 2023-04-08 NOTE — Progress Notes (Addendum)
 PROGRESS NOTE  SERINA NICHTER ZOX:[id number] DOB: 1930/06/06 DOA: 04/07/2023 PCP: Patient, No Pcp Per   LOS: 1 day   Brief narrative:  Felicia Benson is a 89 y.o. female, retired Charity fundraiser, with hx of heart failure with severe RV failure, LV preserved ejection fraction, CAD, PCI to RCA, heart block s/p pacemaker, aortic aneurysm, COPD with chronic hypoxic respiratory failure on 4 L O2, DVT/PE with IVC filter,not a candidate for anticoagulation due to history of GI bleeding., CKD stage IIIa, Crohn's, anemia of chronic disease, recent admission from 2/27 - 3/9 for respiratory failure related to exacerbation of heart failure brought in the hospital from skilled nursing facility due to intermittent hypoxia with pulse ox of 80s despite increasing oxygen to 6 L at the facility.  She had been having worsening peripheral edema.  Patient was then admitted hospital for further evaluation and treatment.    Assessment/Plan: Principal Problem:   Acute exacerbation of CHF (congestive heart failure) (HCC) Active Problems:   Myocardial injury   Sacral ulcer (HCC)   Hypokalemia   Goals of care, counseling/discussion    Acute on chronic heart failure, severe RV failure, LV preserved EF Low output HF  Acute on chronic hypoxic respiratory failure Patient with previous hospitalization for the same.  Last 2D echocardiogram from 2/25 with LV ejection fraction of 50 to 55% with severely decreased RV systolic function.  Was on torsemide 20 mg at home.  Physical Exam with anasarca.  BNP elevated at 2673 and troponin 103.  Chest x-ray with patchy bibasilar opacities.  Patient likely has end-stage heart failure with cool extremities.  Prognosis seems to be poor.  Received 40 mg of Lasix in the ED and will continue 40 mg IV daily.  Midodrine has been decreased to 5 mg 3 times daily.  Continue strict intake and output charting Daily weights.  - Heart failure navigator/TOC consult. Continue goals of care discussions, she is  aware of her poor prognosis.  Patient is hopeful that she is feeling better today. On 4L of oxygen by nasal canula today.   Acute on chronic myocardial injury Elevated troponins but EKG without any ischemic changes or chest pain.  Overall poor prognosis.  Continue home aspirin, atorvastatin.   Stage III sacral pressure ulcer, present on admission Pressure Injury 11/12/22 Sacrum Mid Stage 3 -  Full thickness tissue loss. Subcutaneous fat may be visible but bone, tendon or muscle are NOT exposed. (Active)  11/12/22 1000  Location: Sacrum  Location Orientation: Mid  Staging: Stage 3 -  Full thickness tissue loss. Subcutaneous fat may be visible but bone, tendon or muscle are NOT exposed.  Wound Description (Comments):   Present on Admission: Yes    - Wound care consult   Hypokalemia, mild, asymptomatic Replenished.  Potassium today at 4.9.  Magnesium of 1.8.   CAD with history of RCA PCI Continue aspirin, statin.  Hyperlipidemia.  Continue statins.  Heart block with history of pacemaker:  COPD with CHRF on 4 L O2:  Currently on 6 L of oxygen.  Continue, albuterol every 4 hours as needed, continue home Dulera, singulair   History DVT/PE, IVC filter: Not candidate for anticoagulation due to history of GI bleeding  CKD stage IIIa: Creatinine today at 1.2.  Baseline Cr near 1 - 1.2   Crohn's disease: Continue supportive care.  Anemia of chronic disease: Continue home iron  GERD: Continue home PPI.   DVT prophylaxis: enoxaparin (LOVENOX) injection 40 mg Start: 04/07/23 2200   Disposition:  Skilled nursing facility likely in 1 to 2 days, will get PT OT evaluation.  Status is: Inpatient Remains inpatient appropriate because: Pending clinical improvement, from skilled nursing facility.    Code Status:     Code Status: Limited: Do not attempt resuscitation (DNR) -DNR-LIMITED -Do Not Intubate/DNI   Family Communication: None at  bedside  Consultants: None  Procedures: None  Anti-infectives:  None  Anti-infectives (From admission, onward)    None       Subjective: Today, patient was seen and examined at bedside.  Patient states that she feels a little better with breathing today.  Has less hip pain and breathing better appetite has slightly improved than yesterday.  Objective: Vitals:   04/08/23 0904 04/08/23 1133  BP: 123/80 107/71  Pulse: (!) 102 95  Resp: (!) 23 20  Temp: 97.7 F (36.5 C) 97.6 F (36.4 C)  SpO2: 92% 95%    Intake/Output Summary (Last 24 hours) at 04/08/2023 1147 Last data filed at 04/08/2023 0950 Gross per 24 hour  Intake 840 ml  Output 275 ml  Net 565 ml   Filed Weights   04/07/23 2100 04/08/23 0516  Weight: 84 kg 84.4 kg   Body mass index is 30.02 kg/m.   Physical Exam:  GENERAL: Patient is not in distress,.  Elderly female, alert awake and Communicative, on nasal cannula oxygen, appears chronically ill, HENT: No scleral pallor or icterus. Pupils equally reactive to light. Oral mucosa is moist NECK: is supple, no gross swelling noted.  Distended neck pain. CHEST:   Diminished breath sounds bilaterally. CVS: S1 and S2 heard, murmur noted.  Regular rate and rhythm.  ABDOMEN: Soft, non-tender, bowel sounds are present. EXTREMITIES: Bilateral lower extremity++ edema with cool extremities. CNS: Cranial nerves are intact. No focal motor deficits. SKIN: warm and dry, sacral pressure ulcer, present on admission..  Scattered ecchymosis noted.  Data Review: I have personally reviewed the following laboratory data and studies,  CBC: Recent Labs  Lab 04/07/23 1711 04/07/23 1717 04/08/23 0323  WBC 7.7  --  7.6  NEUTROABS 6.3  --   --   HGB 11.4* 13.3 10.5*  HCT 37.2 39.0 34.2*  MCV 99.7  --  98.8  PLT 243  --  234   Basic Metabolic Panel: Recent Labs  Lab 04/07/23 1711 04/07/23 1717 04/08/23 0323  NA 140 141 138  K 3.1* 3.1* 4.9  CL 93* 93* 99  CO2 36*   --  30  GLUCOSE 109* 106* 105*  BUN 35* 35* 41*  CREATININE 1.18* 1.30* 1.24*  CALCIUM 8.6*  --  8.1*  MG 1.7  --  1.8  PHOS  --   --  3.8   Liver Function Tests: Recent Labs  Lab 04/07/23 1711  AST 21  ALT 13  ALKPHOS 65  BILITOT 1.4*  PROT 6.6  ALBUMIN 2.7*   No results for input(s): "LIPASE", "AMYLASE" in the last 168 hours. No results for input(s): "AMMONIA" in the last 168 hours. Cardiac Enzymes: No results for input(s): "CKTOTAL", "CKMB", "CKMBINDEX", "TROPONINI" in the last 168 hours. BNP (last 3 results) Recent Labs    03/07/23 1213 03/14/23 0430 04/07/23 1711  BNP 1,390.1* 2,510.3* 2,673.6*    ProBNP (last 3 results) No results for input(s): "PROBNP" in the last 8760 hours.  CBG: No results for input(s): "GLUCAP" in the last 168 hours. No results found for this or any previous visit (from the past 240 hours).   Studies: DG Hip Port Unilat W  or Wo Pelvis 1 View Left Result Date: 04/07/2023 CLINICAL DATA:  LEFT hip pain EXAM: DG HIP (WITH OR WITHOUT PELVIS) 1V PORT LEFT COMPARISON:  December 30, 2022 FINDINGS: Osteopenia. Status post ORIF of the RIGHT femur with revisualization of a displaced RIGHT lesser trochanter. No acute fracture or dislocation. Moderate joint space narrowing and mild osteophyte formation of the LEFT hip. No area of erosion or osseous destruction. No unexpected radiopaque foreign body. Diverticulosis. Sacrum is obscured by overlapping bowel contents. Degenerative changes of the lower lumbar spine. The filter. IMPRESSION: Moderate degenerative changes of the LEFT hip. If there is a persistent clinical concern for nondisplaced hip or pelvic fracture, recommend dedicated pelvic CT or MRI. Electronically Signed   By: Meda Klinefelter M.D.   On: 04/07/2023 17:42   DG Chest Port 1 View Result Date: 04/07/2023 CLINICAL DATA:  Shortness of breath EXAM: PORTABLE CHEST 1 VIEW COMPARISON:  March 07, 2023 FINDINGS: The cardiomediastinal silhouette is  unchanged in contour.Atherosclerotic calcifications of the aorta. LEFT chest cardiac pacing device. Favored trace bilateral pleural effusions. No pneumothorax. Patchy bibasilar reticulonodular opacities with peribronchial cuffing and mild interstitial prominence, similar in comparison to prior. IMPRESSION: 1. Similar appearance of patchy bibasilar reticulonodular opacities with peribronchial cuffing and mild interstitial prominence. Findings are favored to reflect a combination of atelectasis and edema. Superimposed infection could appear similar. 2. Favored trace bilateral pleural effusions. Electronically Signed   By: Meda Klinefelter M.D.   On: 04/07/2023 17:40      Joycelyn Das, MD  [facility] 04/08/2023  If 7PM-7AM, please contact night-coverage

## 2023-04-08 NOTE — Consult Note (Signed)
 [facility] Liaison Consult Note   04/08/2023  Felicia Benson 1930/02/07 [id number]  Insurance: Medicare ACO REACH  Primary Care Provider: Patient, No Pcp Per, [past history with [facility]] patient was admitted from [facility] and discussed in unit progression meeting. Patient/family working on LTC facility for needs.    RN Hospital Liaison came to bedside at [facility]. Patient was asleep in NAD. No family present at bedside.   The patient was screened for 30 day readmission hospitalization with noted extreme risk score for unplanned readmission risk 5 hospital admissions in 6 months.  The patient was assessed for potential [facility] Coordination service needs for post hospital transition for care coordination. Review of patient's electronic medical record reveals patient is admitted with heart failure exacerbation with Chronic COPD.   Plan: [facility] Liaison will continue to follow progress and disposition to assess for post hospital community care coordination/management needs.  Referral request for community care coordination: Patient currently shows needs to be addressed for post hospital long term care needs.   VBCI Community Care, Population Health does not replace or interfere with any arrangements made by the Inpatient Transition of Care team.   For questions contact:   Charlesetta Shanks, RN, BSN, CCM [place]  [facility], [facility] Liaison Direct Dial: [phone] or secure chat Email: [email or web address]

## 2023-04-08 NOTE — Consult Note (Signed)
 WOC team consulted for sacral wound.   Please note that the [facility] nursing team is utilizing a standardized work plan to manage patient consults. We are triaging consults and will try to see the patients within 48 hours. Wound photos in the patient's chart allow [place] to consult on the patient in the most efficient and timely manner.    Thank you,    Priscella Mann MSN, RN-BC, [employer] [phone]

## 2023-04-08 NOTE — Plan of Care (Signed)
   Problem: Clinical Measurements: Goal: Will remain free from infection Outcome: Progressing Goal: Cardiovascular complication will be avoided Outcome: Progressing

## 2023-04-08 NOTE — Consult Note (Signed)
 WOC Nurse Consult Note: Reason for Consult: Requested to assess a sacral wound. Wound type: PI stage 3 on coccyx. Pressure Injury POA: Yes Measurement: 1.5 x 1 cm x 0.1 cm Wound bed: 90% yellow, 10% red on the edges. Drainage (amount, consistency, odor) Minimum amount, no odor, serous. Periwound: Intact Dressing procedure/placement/frequency: Apply Xeroform on the wound bed, a single layer, change daily. Cover with foam dressing, change every 3 days or PRN.  WOC team will not plan to follow further.  Please reconsult if further assistance is needed. Thank-you,  Denyse Amass BSN, RN, [employer], WOC  (Pager: [phone])

## 2023-04-09 DIAGNOSIS — I5043 Acute on chronic combined systolic (congestive) and diastolic (congestive) heart failure: Secondary | ICD-10-CM | POA: Diagnosis not present

## 2023-04-09 LAB — BASIC METABOLIC PANEL WITH GFR
Anion gap: 12 (ref 5–15)
BUN: 46 mg/dL — ABNORMAL HIGH (ref 8–23)
CO2: 31 mmol/L (ref 22–32)
Calcium: 8.3 mg/dL — ABNORMAL LOW (ref 8.9–10.3)
Chloride: 95 mmol/L — ABNORMAL LOW (ref 98–111)
Creatinine, Ser: 1.32 mg/dL — ABNORMAL HIGH (ref 0.44–1.00)
GFR, Estimated: 38 mL/min — ABNORMAL LOW (ref >60)
Glucose, Bld: 99 mg/dL (ref 70–99)
Potassium: 4.9 mmol/L (ref 3.5–5.1)
Sodium: 138 mmol/L (ref 135–145)

## 2023-04-09 LAB — CBC
HCT: 32.6 % — ABNORMAL LOW (ref 36.0–46.0)
Hemoglobin: 9.9 g/dL — ABNORMAL LOW (ref 12.0–15.0)
MCH: 30.2 pg (ref 26.0–34.0)
MCHC: 30.4 g/dL (ref 30.0–36.0)
MCV: 99.4 fL (ref 80.0–100.0)
Platelets: 231 10*3/uL (ref 150–400)
RBC: 3.28 MIL/uL — ABNORMAL LOW (ref 3.87–5.11)
RDW: 20.9 % — ABNORMAL HIGH (ref 11.5–15.5)
WBC: 6.7 10*3/uL (ref 4.0–10.5)
nRBC: 0 % (ref 0.0–0.2)

## 2023-04-09 LAB — MAGNESIUM: Magnesium: 1.8 mg/dL (ref 1.7–2.4)

## 2023-04-09 MED ORDER — DOCUSATE SODIUM 100 MG PO CAPS
200.0000 mg | ORAL_CAPSULE | Freq: Every day | ORAL | Status: DC
  Administered 2023-04-09 – 2023-04-10 (×2): 200 mg via ORAL
  Filled 2023-04-09 (×3): qty 2

## 2023-04-09 NOTE — Progress Notes (Signed)
 Heart Failure Navigator Progress Note  Assessed for Heart & Vascular TOC clinic readiness.  Patient does not meet criteria due to EF 50-55%, has a scheduled [facility] appointment on 04/23/2023. No HF TOC. .   Navigator will sign off at this time.   Rhae Hammock, BSN, Scientist, clinical (histocompatibility and immunogenetics) Only

## 2023-04-09 NOTE — Evaluation (Signed)
 Occupational Therapy Evaluation Patient Details Name: Felicia Benson MRN: [id number] DOB: 1930-10-31 Today's Date: 04/09/2023   History of Present Illness   Pt is a 89 year old female who presents from [facility] with SOB.  Pt has had recent admission 02/19/23 for CAP, PE, and DVT and then again on 03/07/23-03/17/23 for CHF and edema. PMHx AAA, CHF, Crohn's Disease, Diverticulitis, diverticulosis, GERD, GI bleed, HTN, OSA, pacemaker, thyroid nodule, DVT with IVC filter,  R femur fx from fall and IM nail (10/24)     Clinical Impressions Pt at this time reported when they were at SNF they were completing stand pivot transfers to chair/W/c with one person assist and then max with PT completed 34 steps with chair follow. She was able to complete supine to sitting with mod x2 and sit to stand transfers with min x2 and completed step pivot to chair with min assist. She agreed to return to SNF at this time and family is looking into LTC as well. Patient will benefit from continued inpatient follow up therapy, <3 hours/day.      If plan is discharge home, recommend the following:   A lot of help with walking and/or transfers;A lot of help with bathing/dressing/bathroom;Assist for transportation;Direct supervision/assist for financial management;Direct supervision/assist for medications management;Assistance with cooking/housework     Functional Status Assessment   Patient has had a recent decline in their functional status and demonstrates the ability to make significant improvements in function in a reasonable and predictable amount of time.     Equipment Recommendations   Other (comment) (TBD at next venue)     Recommendations for Other Services         Precautions/Restrictions   Precautions Precautions: Fall Recall of Precautions/Restrictions: Intact Restrictions Weight Bearing Restrictions Per Provider Order: No     Mobility Bed Mobility Overal bed mobility: Needs  Assistance Bed Mobility: Supine to Sit     Supine to sit: HOB elevated, Used rails, Mod assist, +2 for physical assistance, +2 for safety/equipment          Transfers Overall transfer level: Needs assistance Equipment used: Rolling walker (2 wheels) Transfers: Sit to/from Stand Sit to Stand: From elevated surface, Min assist, +2 physical assistance, +2 safety/equipment     Step pivot transfers: Min assist, From elevated surface            Balance Overall balance assessment: Needs assistance Sitting-balance support: Feet supported, No upper extremity supported Sitting balance-Leahy Scale: Fair     Standing balance support: Bilateral upper extremity supported Standing balance-Leahy Scale: Poor Standing balance comment: Pt reliant on RW to stand.                           ADL either performed or assessed with clinical judgement   ADL Overall ADL's : Needs assistance/impaired Eating/Feeding: Sitting;Set up Eating/Feeding Details (indicate cue type and reason): due to vision but this is baseline Grooming: Wash/dry hands;Wash/dry face;Set up;Sitting   Upper Body Bathing: Set up;Sitting   Lower Body Bathing: Maximal assistance;Cueing for safety;Cueing for sequencing;Bed level   Upper Body Dressing : Set up;Sitting   Lower Body Dressing: Maximal assistance;Sit to/from stand   Toilet Transfer: Minimal assistance;+2 for physical assistance;+2 for safety/equipment;Rolling walker (2 wheels)   Toileting- Clothing Manipulation and Hygiene: Total assistance;Sit to/from stand       Functional mobility during ADLs: Minimal assistance;Rolling walker (2 wheels)       Vision Baseline Vision/History: 6 Macular  Degeneration Ability to See in Adequate Light: 2 Moderately impaired (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) Patient Visual Report: No change from baseline       Perception         Praxis Praxis: WFL       Pertinent Vitals/Pain Pain  Assessment Pain Assessment: Faces Faces Pain Scale: Hurts a little bit Pain Location: Pt rubbing R knee after movement Pain Descriptors / Indicators: Aching Pain Intervention(s): Limited activity within patient's tolerance, Monitored during session, Repositioned     Extremity/Trunk Assessment Upper Extremity Assessment Upper Extremity Assessment: Generalized weakness   Lower Extremity Assessment Lower Extremity Assessment: Defer to PT evaluation   Cervical / Trunk Assessment Cervical / Trunk Assessment: Kyphotic   Communication Communication Communication: No apparent difficulties   Cognition Arousal: Alert Behavior During Therapy: WFL for tasks assessed/performed Cognition: No apparent impairments                               Following commands: Intact       Cueing  General Comments   Cueing Techniques: Verbal cues  MAX HR 127, o2 on 6L with activity and able to remain in 90% but became SOB with standing less then 1 min   Exercises     Shoulder Instructions      Home Living Family/patient expects to be discharged to:: Skilled nursing facility Living Arrangements: Children Available Help at Discharge: Family (daughter and granddaughter) Type of Home: House Home Access: Stairs to enter Secretary/administrator of Steps: 1 Entrance Stairs-Rails: None       Bathroom Shower/Tub: Producer, television/film/video: Handicapped height     Home Equipment: Agricultural consultant (2 wheels);Cane - single point;Shower seat;BSC/3in1   Additional Comments: 4L O2 baseline, wears CPAP at night      Prior Functioning/Environment Prior Level of Function : Needs assist       Physical Assist : Mobility (physical);ADLs (physical) Mobility (physical): Stairs;Gait ADLs (physical): Bathing;Dressing;Toileting Mobility Comments: Pt reported they ambulated last Tuesday about 34 steps with chair/W/c follow. ADLs Comments: Pt reported assist with all LE ADLS but could  complete UE ADLS    OT Problem List: Decreased strength;Decreased activity tolerance;Impaired balance (sitting and/or standing);Decreased knowledge of use of DME or AE;Cardiopulmonary status limiting activity   OT Treatment/Interventions: Self-care/ADL training;Energy conservation;DME and/or AE instruction;Therapeutic activities;Patient/family education;Balance training      OT Goals(Current goals can be found in the care plan section)   Acute Rehab OT Goals Patient Stated Goal: to get back to therapy OT Goal Formulation: With patient Time For Goal Achievement: 04/23/23 Potential to Achieve Goals: Good   OT Frequency:  Min 1X/week    Co-evaluation              AM-PAC OT "6 Clicks" Daily Activity     Outcome Measure Help from another person eating meals?: A Little Help from another person taking care of personal grooming?: A Little Help from another person toileting, which includes using toliet, bedpan, or urinal?: Total Help from another person bathing (including washing, rinsing, drying)?: A Lot Help from another person to put on and taking off regular upper body clothing?: A Little Help from another person to put on and taking off regular lower body clothing?: A Lot 6 Click Score: 14   End of Session Equipment Utilized During Treatment: Gait belt;Rolling walker (2 wheels) Nurse Communication: Mobility status  Activity Tolerance: Patient tolerated treatment  well Patient left: in chair;with call bell/phone within reach;with chair alarm set  OT Visit Diagnosis: Unsteadiness on feet (R26.81);Other abnormalities of gait and mobility (R26.89);Repeated falls (R29.6);Muscle weakness (generalized) (M62.81)                Time: 0102-7253 OT Time Calculation (min): 38 min Charges:  OT General Charges $OT Visit: 1 Visit OT Evaluation $OT Eval Moderate Complexity: 1 Mod  Felicia Benson OTR/L  Acute Rehab Services  [phone] office number   Alphia Moh 04/09/2023, 10:19 AM

## 2023-04-09 NOTE — Progress Notes (Signed)
 Occupational Therapy Treatment Patient Details Name: Felicia Benson MRN: [id number] DOB: Feb 15, 1930 Today's Date: 04/09/2023   History of present illness Pt is a 89 year old female who presents from [facility] with SOB.  Pt has had recent admission 02/19/23 for CAP, PE, and DVT and then again on 03/07/23-03/17/23 for CHF and edema. PMHx AAA, CHF, Crohn's Disease, Diverticulitis, diverticulosis, GERD, GI bleed, HTN, OSA, pacemaker, thyroid nodule, DVT with IVC filter,  R femur fx from fall and IM nail (10/24)   OT comments  Pt reporting they want to go back to bed and only wanted therapy to assist them at this time. Pt required mod-max from lower surface of chair for sit to stand transfer. She then completed step pivot to the bed with min assist and mod assist for sitting to supine. Pt was educated about working with nursing care with OOB activity. Patient will benefit from continued inpatient follow up therapy, <3 hours/day.      If plan is discharge home, recommend the following:  A lot of help with walking and/or transfers;A lot of help with bathing/dressing/bathroom;Assist for transportation;Direct supervision/assist for financial management;Direct supervision/assist for medications management;Assistance with cooking/housework   Equipment Recommendations  Other (comment) (TBD at next level of care)    Recommendations for Other Services      Precautions / Restrictions Precautions Precautions: Fall Recall of Precautions/Restrictions: Intact Restrictions Weight Bearing Restrictions Per Provider Order: No       Mobility Bed Mobility Overal bed mobility: Needs Assistance Bed Mobility: Sit to Supine     Supine to sit: HOB elevated, Used rails, Mod assist, +2 for physical assistance, +2 for safety/equipment Sit to supine: Mod assist   General bed mobility comments: max x2 to reposition in bed    Transfers Overall transfer level: Needs assistance Equipment used: Rolling walker (2  wheels) Transfers: Sit to/from Stand Sit to Stand: Mod assist     Step pivot transfers: Min assist, From elevated surface           Balance Overall balance assessment: Needs assistance Sitting-balance support: Feet supported, Bilateral upper extremity supported Sitting balance-Leahy Scale: Fair     Standing balance support: Bilateral upper extremity supported Standing balance-Leahy Scale: Poor Standing balance comment: Pt reliant on RW to stand.                           ADL either performed or assessed with clinical judgement   ADL Overall ADL's : Needs assistance/impaired Eating/Feeding: Sitting;Set up Eating/Feeding Details (indicate cue type and reason): due to vision but this is baseline Grooming: Wash/dry hands;Wash/dry face;Set up;Sitting   Upper Body Bathing: Set up;Sitting   Lower Body Bathing: Maximal assistance;Cueing for safety;Cueing for sequencing;Bed level   Upper Body Dressing : Set up;Sitting   Lower Body Dressing: Maximal assistance;Sit to/from stand   Toilet Transfer: Minimal assistance;+2 for physical assistance;+2 for safety/equipment;Rolling walker (2 wheels) Toilet Transfer Details (indicate cue type and reason): simulated Toileting- Clothing Manipulation and Hygiene: Total assistance;Sit to/from stand       Functional mobility during ADLs: Minimal assistance;Rolling walker (2 wheels) General ADL Comments: pt required min assist to guide walker for transfer    Extremity/Trunk Assessment Upper Extremity Assessment Upper Extremity Assessment: Generalized weakness   Lower Extremity Assessment Lower Extremity Assessment: Defer to PT evaluation   Cervical / Trunk Assessment Cervical / Trunk Assessment: Kyphotic    Vision Baseline Vision/History: 6 Macular Degeneration Ability to See in Adequate Light:  2 Moderately impaired (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) Patient Visual Report: No change from baseline Vision  Assessment?: Wears glasses for reading;Wears glasses for driving Additional Comments: blind in R eye, 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   Perception     Praxis Praxis Praxis: [facility]   Communication Communication Communication: No apparent difficulties   Cognition Arousal: Alert Behavior During Therapy: WFL for tasks assessed/performed Cognition: No apparent impairments                               Following commands: Intact        Cueing   Cueing Techniques: Verbal cues  Exercises      Shoulder Instructions       General Comments max hr 116    Pertinent Vitals/ Pain       Pain Assessment Pain Assessment: Faces Faces Pain Scale: Hurts little more Pain Location: B knee pain Pain Descriptors / Indicators: Aching Pain Intervention(s): Premedicated before session  Home Living Family/patient expects to be discharged to:: Skilled nursing facility Living Arrangements: Children Available Help at Discharge: Family (daughter and granddaughter) Type of Home: House Home Access: Stairs to enter Secretary/administrator of Steps: 1 Entrance Stairs-Rails: None       Bathroom Shower/Tub: Producer, television/film/video: Handicapped height     Home Equipment: Agricultural consultant (2 wheels);Cane - single point;Shower seat;BSC/3in1   Additional Comments: 4L O2 baseline, wears CPAP at night      Prior Functioning/Environment              Frequency  Min 1X/week        Progress Toward Goals  OT Goals(current goals can now be found in the care plan section)  Progress towards OT goals: Progressing toward goals  Acute Rehab OT Goals Patient Stated Goal: to rest OT Goal Formulation: With patient Time For Goal Achievement: 04/23/23 Potential to Achieve Goals: Good ADL Goals Pt Will Perform Grooming: with supervision;with set-up;sitting Pt Will Perform Upper Body Bathing: with modified independence;sitting Pt Will Perform Lower Body Bathing: with mod  assist;sitting/lateral leans;sit to/from stand Pt Will Perform Upper Body Dressing: with supervision;with set-up;sitting Pt Will Transfer to Toilet: with contact guard assist;ambulating Pt Will Perform Toileting - Clothing Manipulation and hygiene: with mod assist;with min assist;sitting/lateral leans;sit to/from stand Additional ADL Goal #1: Pt will be able to complete standing for 5 mins to be able to participate in LE bathing/peri care  Plan      Co-evaluation                 AM-PAC OT "6 Clicks" Daily Activity     Outcome Measure   Help from another person eating meals?: A Little Help from another person taking care of personal grooming?: A Little Help from another person toileting, which includes using toliet, bedpan, or urinal?: Total Help from another person bathing (including washing, rinsing, drying)?: A Lot Help from another person to put on and taking off regular upper body clothing?: A Little Help from another person to put on and taking off regular lower body clothing?: A Lot 6 Click Score: 14    End of Session Equipment Utilized During Treatment: Gait belt;Rolling walker (2 wheels)  OT Visit Diagnosis: Unsteadiness on feet (R26.81);Other abnormalities of gait and mobility (R26.89);Repeated falls (R29.6);Muscle weakness (generalized) (M62.81)   Activity Tolerance Patient tolerated treatment well   Patient Left in bed;with call bell/phone within reach;with  bed alarm set   Nurse Communication Mobility status        Time: 1610-9604 OT Time Calculation (min): 22 min  Charges: OT General Charges $OT Visit: 1 Visit OT Evaluation $OT Eval Moderate Complexity: 1 Mod OT Treatments $Self Care/Home Management : 8-22 mins  Presley Raddle OTR/L  Acute Rehab Services  [phone] office number   Alphia Moh 04/09/2023, 12:10 PM

## 2023-04-09 NOTE — Progress Notes (Signed)
 PROGRESS NOTE  TEENA MANGUS BJY:[id number] DOB: May 23, 1930 DOA: 04/07/2023 PCP: Patient, No Pcp Per   LOS: 2 days   Brief narrative:  Felicia Benson is a 89 y.o. female with hx of heart failure with severe RV failure, LV preserved ejection fraction, CAD, PCI to RCA, heart block s/p pacemaker, aortic aneurysm, COPD with chronic hypoxic respiratory failure on 4 L O2, DVT/PE with IVC filter,not a candidate for anticoagulation due to history of GI bleeding., CKD stage IIIa, Crohn's, anemia of chronic disease, recent admission from 2/27 - 3/9 for respiratory failure related to exacerbation of heart failure brought in the hospital from skilled nursing facility due to intermittent hypoxia with pulse ox of 80s despite increasing oxygen to 6 L at the facility.  She had been having worsening peripheral edema.  Patient was then admitted hospital for further evaluation and treatment.    Assessment/Plan: Principal Problem:   Acute exacerbation of CHF (congestive heart failure) (HCC) Active Problems:   Myocardial injury   Sacral ulcer (HCC)   Hypokalemia   Goals of care, counseling/discussion    Acute on chronic heart failure, severe RV failure, LV preserved EF Low output HF  Acute on chronic hypoxic respiratory failure Patient with previous hospitalization for the same.  Last 2D echocardiogram from 2/25 with LV ejection fraction of 50 to 55% with severely decreased RV systolic function.  Was on torsemide 20 mg at home.  Physical Exam with anasarca.  BNP elevated at 2673 and troponin 103.  Chest x-ray with patchy bibasilar opacities.  Patient likely has end-stage heart failure with cool extremities.  Prognosis seems to be poor.  Received 40 mg of Lasix in the ED and will continue 40 mg IV daily.  Midodrine has been decreased to 5 mg 3 times daily.  Continue strict intake and output charting Daily weights. Patient is hopeful that she is feeling better today. On 4L of oxygen by nasal canula today.  Plan  for transfer back to skilled nursing facility by tomorrow.  Transition to oral diuretic in AM.  Acute on chronic myocardial injury Elevated troponins but EKG without any ischemic changes or chest pain.  Overall poor prognosis.  Continue home aspirin, atorvastatin.   Stage III sacral pressure ulcer, present on admission Pressure Injury 11/12/22 Sacrum Mid Stage 3 -  Full thickness tissue loss. Subcutaneous fat may be visible but bone, tendon or muscle are NOT exposed. (Active)  11/12/22 1000  Location: Sacrum  Location Orientation: Mid  Staging: Stage 3 -  Full thickness tissue loss. Subcutaneous fat may be visible but bone, tendon or muscle are NOT exposed.  Wound Description (Comments):   Present on Admission: Yes    - Wound care consult   Hypokalemia, mild, asymptomatic Replenished.  Potassium today at 4.9.  Magnesium of 1.8.   CAD with history of RCA PCI Continue aspirin, statin.  Hyperlipidemia.  Continue statins.  Heart block with history of pacemaker:  COPD with CHRF on 4 L O2:  Currently on 6 L of oxygen.  Continue, albuterol every 4 hours as needed, continue home Dulera, singulair   History DVT/PE, IVC filter: Not candidate for anticoagulation due to history of GI bleeding  CKD stage IIIa: Creatinine today at 1.3.  Baseline Cr near 1 - 1.2   Crohn's disease: Continue supportive care.  Anemia of chronic disease: Continue home iron  GERD: Continue home PPI.   DVT prophylaxis: enoxaparin (LOVENOX) injection 40 mg Start: 04/07/23 2200   Disposition: Skilled nursing facility likely  on 04/09/2020.  Seen by PT and recommended skilled nursing facility.  Status is: Inpatient Remains inpatient appropriate because: pending clinical improvement,    Code Status:     Code Status: Limited: Do not attempt resuscitation (DNR) -DNR-LIMITED -Do Not Intubate/DNI   Family Communication: None at  bedside  Consultants: None  Procedures: None  Anti-infectives:  None  Anti-infectives (From admission, onward)    None       Subjective: Today, patient was seen and examined at bedside.  States that she feels better.  Breathing denies any pain, nausea, vomiting, fever, chills or rigor.  Still on IV diuretic.   Objective: Vitals:   04/09/23 0918 04/09/23 1123  BP:  119/77  Pulse:  95  Resp:  (!) 25  Temp:  97.8 F (36.6 C)  SpO2: 98% 96%    Intake/Output Summary (Last 24 hours) at 04/09/2023 1325 Last data filed at 04/09/2023 0907 Gross per 24 hour  Intake 563 ml  Output 600 ml  Net -37 ml   Filed Weights   04/08/23 0516 04/09/23 0412 04/09/23 0748  Weight: 84.4 kg 87.1 kg 86.5 kg   Body mass index is 30.78 kg/m.   Physical Exam:  GENERAL: Patient is not in distress,.  Elderly female, alert awake and Communicative, on nasal cannula oxygen, appears chronically ill, HENT: No scleral pallor or icterus. Pupils equally reactive to light. Oral mucosa is moist NECK: is supple, no gross swelling noted.  Distended neck pain. CHEST:   Diminished breath sounds bilaterally. CVS: S1 and S2 heard, murmur noted.  Regular rate and rhythm.  ABDOMEN: Soft, non-tender, bowel sounds are present. EXTREMITIES: Bilateral lower extremity++ edema with cool extremities. CNS: Cranial nerves are intact. No focal motor deficits. SKIN: warm and dry, sacral pressure ulcer, present on admission..  Scattered ecchymosis noted.  Data Review: I have personally reviewed the following laboratory data and studies,  CBC: Recent Labs  Lab 04/07/23 1711 04/07/23 1717 04/08/23 0323 04/09/23 0348  WBC 7.7  --  7.6 6.7  NEUTROABS 6.3  --   --   --   HGB 11.4* 13.3 10.5* 9.9*  HCT 37.2 39.0 34.2* 32.6*  MCV 99.7  --  98.8 99.4  PLT 243  --  234 231   Basic Metabolic Panel: Recent Labs  Lab 04/07/23 1711 04/07/23 1717 04/08/23 0323 04/09/23 0348  NA 140 141 138 138  K 3.1* 3.1* 4.9 4.9   CL 93* 93* 99 95*  CO2 36*  --  30 31  GLUCOSE 109* 106* 105* 99  BUN 35* 35* 41* 46*  CREATININE 1.18* 1.30* 1.24* 1.32*  CALCIUM 8.6*  --  8.1* 8.3*  MG 1.7  --  1.8 1.8  PHOS  --   --  3.8  --    Liver Function Tests: Recent Labs  Lab 04/07/23 1711  AST 21  ALT 13  ALKPHOS 65  BILITOT 1.4*  PROT 6.6  ALBUMIN 2.7*   No results for input(s): "LIPASE", "AMYLASE" in the last 168 hours. No results for input(s): "AMMONIA" in the last 168 hours. Cardiac Enzymes: No results for input(s): "CKTOTAL", "CKMB", "CKMBINDEX", "TROPONINI" in the last 168 hours. BNP (last 3 results) Recent Labs    03/07/23 1213 03/14/23 0430 04/07/23 1711  BNP 1,390.1* 2,510.3* 2,673.6*    ProBNP (last 3 results) No results for input(s): "PROBNP" in the last 8760 hours.  CBG: No results for input(s): "GLUCAP" in the last 168 hours. No results found for this or any previous  visit (from the past 240 hours).   Studies: DG Hip [place] or [place] Pelvis 1 View Left Result Date: 04/07/2023 CLINICAL DATA:  LEFT hip pain EXAM: DG HIP (WITH OR WITHOUT PELVIS) 1V PORT LEFT COMPARISON:  December 30, 2022 FINDINGS: Osteopenia. Status post ORIF of the RIGHT femur with revisualization of a displaced RIGHT lesser trochanter. No acute fracture or dislocation. Moderate joint space narrowing and mild osteophyte formation of the LEFT hip. No area of erosion or osseous destruction. No unexpected radiopaque foreign body. Diverticulosis. Sacrum is obscured by overlapping bowel contents. Degenerative changes of the lower lumbar spine. The filter. IMPRESSION: Moderate degenerative changes of the LEFT hip. If there is a persistent clinical concern for nondisplaced hip or pelvic fracture, recommend dedicated pelvic CT or MRI. Electronically Signed   By: Meda Klinefelter M.D.   On: 04/07/2023 17:42   DG Chest Port 1 View Result Date: 04/07/2023 CLINICAL DATA:  Shortness of breath EXAM: PORTABLE CHEST 1 VIEW COMPARISON:   March 07, 2023 FINDINGS: The cardiomediastinal silhouette is unchanged in contour.Atherosclerotic calcifications of the aorta. LEFT chest cardiac pacing device. Favored trace bilateral pleural effusions. No pneumothorax. Patchy bibasilar reticulonodular opacities with peribronchial cuffing and mild interstitial prominence, similar in comparison to prior. IMPRESSION: 1. Similar appearance of patchy bibasilar reticulonodular opacities with peribronchial cuffing and mild interstitial prominence. Findings are favored to reflect a combination of atelectasis and edema. Superimposed infection could appear similar. 2. Favored trace bilateral pleural effusions. Electronically Signed   By: Meda Klinefelter M.D.   On: 04/07/2023 17:40      Joycelyn Das, MD  [facility] 04/09/2023  If 7PM-7AM, please contact night-coverage

## 2023-04-09 NOTE — [place] FL2 (Signed)
 [place] MEDICAID FL2 LEVEL OF CARE FORM     IDENTIFICATION  Patient Name: Felicia Benson Birthdate: 05/21/1930 Sex: female Admission Date (Current Location): 04/07/2023  [facility] and [place] Number:  Producer, television/film/video and Address:  [facility], [address] [place], [place] [address]      Provider Number: [id number]  Attending Physician Name and Address:  Joycelyn Das, MD  Relative Name and Phone Number:  Alvis Lemmings (daughter) [phone]    Current Level of Care: Hospital Recommended Level of Care: Skilled Nursing Facility Prior Approval Number:    Date Approved/Denied:   PASRR Number: [id number]  Discharge Plan: SNF    Current Diagnoses: Patient Active Problem List   Diagnosis Date Noted   Myocardial injury 04/08/2023   Sacral ulcer (HCC) 04/08/2023   Hypokalemia 04/08/2023   Goals of care, counseling/discussion 04/08/2023   AKI (acute kidney injury) (HCC) 03/13/2023   Acute on chronic respiratory failure with hypoxia (HCC) 03/08/2023   Elevated troponin 03/08/2023   Acute exacerbation of CHF (congestive heart failure) (HCC) 03/07/2023   Right ventricular failure (HCC) 02/15/2023   Shortness of breath 02/13/2023   DVT (deep venous thrombosis) (HCC) 02/11/2023   Acute pulmonary embolism (HCC) 02/08/2023   Acute respiratory failure with hypoxia (HCC) 02/08/2023   CAP (community acquired pneumonia) 02/07/2023   Filling defect on imaging study 02/07/2023   CKD (chronic kidney disease) 02/07/2023   Pneumonia 02/07/2023   Diverticular hemorrhage 12/28/2022   Ileus, postoperative (HCC) 11/04/2022   Closed right hip fracture (HCC) 10/28/2022   Fall at home, initial encounter 10/28/2022   History of COPD 10/28/2022   Allergic rhinitis 10/28/2022   Anemia of chronic disease 10/28/2022   Chronic rhinitis 08/06/2022   OSA (obstructive sleep apnea) 08/06/2022   Heart failure, acute diastolic (HCC) 07/21/2022   CKD stage 3a, GFR 45-59  ml/min (HCC) 07/21/2022   Thoracic aortic aneurysm (HCC) 07/21/2022   GERD (gastroesophageal reflux disease) 07/21/2022   Pancreatic lesion 07/21/2022   Macular degeneration 07/21/2022   Hypocalcemia 07/21/2022   Insomnia 07/21/2022   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   TIA (transient ischemic attack) 12/31/2020   Acute on chronic diastolic heart failure (HCC) 05/19/2019   Acute CHF (congestive heart failure) (HCC) 05/18/2019   Chronic hypoxic respiratory failure (HCC) 05/18/2019   COPD (chronic obstructive pulmonary disease) (HCC) 05/18/2019   History of cardiac pacemaker 05/18/2019   AAA (abdominal aortic aneurysm) 05/18/2019   Hyperlipidemia 05/18/2019   Abdominal pain in female 05/30/2015   Essential hypertension 05/30/2015   Crohn's disease (HCC) 05/30/2015   Gastrointestinal hemorrhage with melena 05/30/2015   Blood loss anemia 05/30/2015   Hyponatremia 05/30/2015   Kidney disease 05/30/2015   Normocytic anemia 05/30/2015   CAD S/P percutaneous coronary angioplasty 05/30/2015   Acute blood loss anemia 05/30/2015   Heme + stool     Orientation RESPIRATION BLADDER Height & Weight     Self, Time, Situation, Place  O2 (4L O2) Incontinent, External catheter Weight: 190 lb 11.2 oz (86.5 kg) Height:  5\' 6"  (167.6 cm)  BEHAVIORAL SYMPTOMS/MOOD NEUROLOGICAL BOWEL NUTRITION STATUS      Continent Diet (Please see discharge summary)  AMBULATORY STATUS COMMUNICATION OF NEEDS Skin   Extensive Assist   Other (Comment) (Abrasion,Arm,L,Gauze,Ecchymosis,Arm,Leg,Bil.,Wound/Incision LDAs,PI Sacrum mid stage 3,Wound/Incision open or dehisced skin tear,arm,anterior,L,upper)                       Personal Care Assistance Level of Assistance  Bathing, Feeding, Dressing Bathing Assistance: Maximum assistance Feeding assistance: Limited assistance Dressing Assistance: Maximum assistance     Functional Limitations Info  Sight, Hearing, Speech Sight Info: Impaired Hearing  Info: Impaired Speech Info: Adequate    SPECIAL CARE FACTORS FREQUENCY  PT (By licensed PT), OT (By licensed OT)     PT Frequency: 5x week OT Frequency: 5x week            Contractures Contractures Info: Not present    Additional Factors Info  Code Status, Allergies, Psychotropic Code Status Info: DNR limited Allergies Info: Motrin (ibuprofen),Ace Inhibitors,Breztri Aerosphere (budeson-glycopyrrol-formoterol),Cipro (ciprofloxacin Hcl),Levaquin (levofloxacin),Nsaids           Current Medications (04/09/2023):  This is the current hospital active medication list Current Facility-Administered Medications  Medication Dose Route Frequency Provider Last Rate Last Admin   acetaminophen (TYLENOL) tablet 1,000 mg  1,000 mg Oral Q6H PRN Dolly Rias, MD   1,000 mg at 04/09/23 0903   albuterol (PROVENTIL) (2.5 MG/3ML) 0.083% nebulizer solution 2.5 mg  2.5 mg Nebulization Q4H PRN Dolly Rias, MD       aspirin EC tablet 81 mg  81 mg Oral Daily Dolly Rias, MD   81 mg at 04/09/23 4098   atorvastatin (LIPITOR) tablet 40 mg  40 mg Oral QHS Segars, Christiane Ha, MD   40 mg at 04/08/23 2205   docusate sodium (COLACE) capsule 200 mg  200 mg Oral Daily Pokhrel, Laxman, MD   200 mg at 04/09/23 1226   enoxaparin (LOVENOX) injection 40 mg  40 mg Subcutaneous Q24H Dolly Rias, MD   40 mg at 04/08/23 2206   ferrous sulfate tablet 325 mg  325 mg Oral Daily Dolly Rias, MD   325 mg at 04/09/23 1191   furosemide (LASIX) injection 40 mg  40 mg Intravenous Daily Dolly Rias, MD   40 mg at 04/09/23 4782   melatonin tablet 6 mg  6 mg Oral QHS Segars, Christiane Ha, MD   6 mg at 04/08/23 2205   midodrine (PROAMATINE) tablet 5 mg  5 mg Oral TID WC Dolly Rias, MD   5 mg at 04/09/23 1226   mometasone-formoterol (DULERA) 100-5 MCG/ACT inhaler 2 puff  2 puff Inhalation BID Dolly Rias, MD   2 puff at 04/09/23 0918   montelukast (SINGULAIR) tablet 10 mg  10 mg Oral Daily Segars,  Christiane Ha, MD   10 mg at 04/09/23 0903   ondansetron (ZOFRAN) injection 4 mg  4 mg Intravenous Q6H PRN Segars, Christiane Ha, MD       oxyCODONE-acetaminophen (PERCOCET/ROXICET) 5-325 MG per tablet 1 tablet  1 tablet Oral Q8H PRN Dolly Rias, MD   1 tablet at 04/08/23 2212   pantoprazole (PROTONIX) EC tablet 40 mg  40 mg Oral BID Dolly Rias, MD   40 mg at 04/09/23 0903   polyethylene glycol (MIRALAX / GLYCOLAX) packet 17 g  17 g Oral Daily PRN Dolly Rias, MD       sodium chloride flush (NS) 0.9 % injection 3 mL  3 mL Intravenous Q12H Dolly Rias, MD   3 mL at 04/09/23 0907     Discharge Medications: Please see discharge summary for a list of discharge medications.  Relevant Imaging Results:  Relevant Lab Results:   Additional Information [id number]  Michaela Corner, LCSWA

## 2023-04-09 NOTE — Plan of Care (Signed)

## 2023-04-09 NOTE — Evaluation (Signed)
 Physical Therapy Evaluation Patient Details Name: Felicia Benson MRN: [id number] DOB: Jan 07, 1931 Today's Date: 04/09/2023  History of Present Illness  Pt is a 89 year old female who presents from [facility] SN[facility] with SOB.  Pt has had recent admission 02/19/23 for CAP, PE, and DVT and then again on 03/07/23-03/17/23 for CHF and edema. PMHx AAA, CHF, Crohn's Disease, Diverticulitis, diverticulosis, GERD, GI bleed, HTN, OSA, pacemaker, thyroid nodule, DVT with IVC filter,  R femur fx from fall and IM nail (10/24)   Clinical Impression  Pt admitted with above. Pt very anxious regarding falling due to fall in hospital during recent admission. Pt on 5LO2 via [place] and maintained SpO2 > 90% t/o session. Pt modA for bed mobility, minAx2 for standing and transfer to recliner. Pt to benefit from continued inpatient rehab program < 3 hrs a day to improve mobilization to a level of function that family can safely provide at home. Acute PT to cont to follow.        If plan is discharge home, recommend the following: A lot of help with walking and/or transfers;A lot of help with bathing/dressing/bathroom;Assist for transportation;Help with stairs or ramp for entrance   Can travel by private vehicle   No    Equipment Recommendations None recommended by PT  Recommendations for Other Services       Functional Status Assessment Patient has had a recent decline in their functional status and demonstrates the ability to make significant improvements in function in a reasonable and predictable amount of time.     Precautions / Restrictions Precautions Precautions: Fall Recall of Precautions/Restrictions: Intact Restrictions Weight Bearing Restrictions Per Provider Order: No      Mobility  Bed Mobility Overal bed mobility: Needs Assistance Bed Mobility: Supine to Sit     Supine to sit: HOB elevated, Used rails, Mod assist, +2 for physical assistance, +2 for safety/equipment Sit to supine: Max assist  (Simultaneous filing. User may not have seen previous data.)   General bed mobility comments: HOB elevated, verbal directional cues, modA for hips to EOB and trunk elevation    Transfers Overall transfer level: Needs assistance Equipment used: Rolling walker (2 wheels) Transfers: Sit to/from Stand, Bed to chair/wheelchair/BSC Sit to Stand: From elevated surface, Min assist, +2 physical assistance, +2 safety/equipment   Step pivot transfers: Min assist, From elevated surface       General transfer comment: minAx2 to power up and steady in RW, pt with trunk flexion. minA for step to transfer to recliner, minA for walker management, increased time    Ambulation/Gait Ambulation/Gait assistance: Contact guard assist   Assistive device: Rolling walker (2 wheels) Gait Pattern/deviations: Step-through pattern, Decreased stride length, Trunk flexed, Shuffle Gait velocity: Decreased     General Gait Details: limited to step transfer to recliner. Pt with increased anxiety re: falling as she fell last hospital admision  Stairs            Wheelchair Mobility     Tilt Bed    Modified Rankin (Stroke Patients Only)       Balance Overall balance assessment: Needs assistance Sitting-balance support: Feet supported, No upper extremity supported Sitting balance-Leahy Scale: Fair Sitting balance - Comments: Pt sits EOB without LOB Postural control:  (anterior lean) Standing balance support: Bilateral upper extremity supported Standing balance-Leahy Scale: Poor Standing balance comment: Pt reliant on RW to stand.  Pertinent Vitals/Pain Pain Assessment Pain Assessment: Faces Faces Pain Scale: Hurts a little bit Pain Location: Pt rubbing R knee after movement Pain Descriptors / Indicators: Aching Pain Intervention(s): Monitored during session    Home Living Family/patient expects to be discharged to:: Skilled nursing facility (at  [facility] currently) Living Arrangements: Children Available Help at Discharge:  (daughter and granddaughter) Type of Home: House Home Access: Stairs to enter Entrance Stairs-Rails: None Entrance Stairs-Number of Steps: 1     Home Equipment: Agricultural consultant (2 wheels);Cane - single point;Shower seat;BSC/3in1 Additional Comments: 4L O2 baseline, wears CPAP at night    Prior Function Prior Level of Function : Needs assist       Physical Assist : Mobility (physical);ADLs (physical) Mobility (physical): Stairs;Gait ADLs (physical): Bathing;Dressing;Toileting Mobility Comments: Pt reported they ambulated last Tuesday about 34 steps with chair/W/c follow. std pvt with RW for transfer into w/c from bed ADLs Comments: Pt reported assist with all LE ADLS but could complete UE ADLS     Extremity/Trunk Assessment   Upper Extremity Assessment Upper Extremity Assessment: Defer to OT evaluation    Lower Extremity Assessment Lower Extremity Assessment: Generalized weakness (R weaker than L due to R femur fx in 2024)    Cervical / Trunk Assessment Cervical / Trunk Assessment: Kyphotic  Communication   Communication Communication: No apparent difficulties    Cognition Arousal: Alert Behavior During Therapy: WFL for tasks assessed/performed   PT - Cognitive impairments: No apparent impairments                         Following commands: Intact       Cueing Cueing Techniques: Verbal cues     General Comments General comments (skin integrity, edema, etc.): SpO2 >90% on 6LO2, noted SOB increased WOB with mobility, HR up to 127bpm    Exercises General Exercises - Upper Extremity Shoulder Flexion: AROM, Both, 10 reps, Seated Shoulder ABduction: AROM, Both, 10 reps, Seated General Exercises - Lower Extremity Ankle Circles/Pumps: AROM, Both, 15 reps Heel Slides: AAROM, Right, 10 reps, Supine   Assessment/Plan    PT Assessment Patient needs continued PT services   PT Problem List Decreased strength;Decreased range of motion;Decreased activity tolerance;Decreased balance;Decreased mobility;Decreased coordination;Decreased knowledge of use of DME;Decreased safety awareness;Decreased knowledge of precautions;Cardiopulmonary status limiting activity;Obesity       PT Treatment Interventions DME instruction;Gait training;Stair training;Functional mobility training;Therapeutic activities;Therapeutic exercise;Neuromuscular re-education;Balance training;Patient/family education    PT Goals (Current goals can be found in the Care Plan section)  Acute Rehab PT Goals Patient Stated Goal: return to [place] PT Goal Formulation: With patient Time For Goal Achievement: 04/23/23 Potential to Achieve Goals: Good    Frequency Min 1X/week     Co-evaluation PT/OT/SLP Co-Evaluation/Treatment: Yes Reason for Co-Treatment: To address functional/ADL transfers PT goals addressed during session: Mobility/safety with mobility         AM-PAC PT "6 Clicks" Mobility  Outcome Measure Help needed turning from your back to your side while in a flat bed without using bedrails?: A Lot Help needed moving from lying on your back to sitting on the side of a flat bed without using bedrails?: A Lot Help needed moving to and from a bed to a chair (including a wheelchair)?: A Lot Help needed standing up from a chair using your arms (e.g., wheelchair or bedside chair)?: A Lot Help needed to walk in hospital room?: Total Help needed climbing 3-5 steps with a railing? : Total 6 Click Score:  10    End of Session Equipment Utilized During Treatment: Gait belt;Oxygen Activity Tolerance: Patient tolerated treatment well Patient left: in chair;with call bell/phone within reach;with chair alarm set Nurse Communication: Mobility status PT Visit Diagnosis: Unsteadiness on feet (R26.81);Other abnormalities of gait and mobility (R26.89);Muscle weakness (generalized) (M62.81);Difficulty  in walking, not elsewhere classified (R26.2)    Time: 1610-9604 PT Time Calculation (min) (ACUTE ONLY): 39 min   Charges:   PT Evaluation $PT Eval Moderate Complexity: 1 Mod   PT General Charges $$ ACUTE PT VISIT: 1 Visit         Lewis Shock, PT, DPT Acute Rehabilitation Services Secure chat preferred Office #: [phone]   Iona Hansen 04/09/2023, 12:31 PM

## 2023-04-09 NOTE — TOC Progression Note (Addendum)
 Transition of Care [facility]) - Progression Note    Patient Details  Name: Felicia Benson MRN: [id number] Date of Birth: 1930/08/29  [facility] of [facility] CM/SW Contact  Michaela Corner, [place] Phone Number: 04/09/2023, 11:09 AM  Clinical Narrative:   Per MD, patient can potentially dc back to [facility] tomorrow 4/2. CSW asked [facility] admissions about patients dc tomorrow, they are able to accept.   1:29 PM CSW left VM for patients dtr, [name].   TOC will continue to follow.    Expected Discharge Plan: Skilled Nursing Facility Barriers to Discharge: Continued Medical Work up  Expected Discharge Plan and Services In-house Referral: Clinical Social Work     Living arrangements for the past 2 months: Skilled Nursing Facility                                       Social Determinants of Health (SDOH) Interventions SDOH Screenings   Food Insecurity: No Food Insecurity (04/08/2023)  Housing: Low Risk  (04/08/2023)  Transportation Needs: No Transportation Needs (04/08/2023)  Utilities: Not At Risk (04/08/2023)  Social Connections: Moderately Isolated (04/08/2023)  Tobacco Use: Medium Risk (04/07/2023)    Readmission Risk Interventions    07/24/2022    2:49 PM  Readmission Risk Prevention Plan  Transportation Screening Complete  PCP or Specialist Appt within 5-7 Days Complete  Home Care Screening Complete  Medication Review (RN CM) Complete

## 2023-04-10 ENCOUNTER — Ambulatory Visit: Admitting: Podiatry

## 2023-04-10 ENCOUNTER — Ambulatory Visit: Admitting: Cardiovascular Disease

## 2023-04-10 DIAGNOSIS — I5043 Acute on chronic combined systolic (congestive) and diastolic (congestive) heart failure: Secondary | ICD-10-CM | POA: Diagnosis not present

## 2023-04-10 LAB — BASIC METABOLIC PANEL WITH GFR
Anion gap: 10 (ref 5–15)
BUN: 41 mg/dL — ABNORMAL HIGH (ref 8–23)
CO2: 32 mmol/L (ref 22–32)
Calcium: 8.2 mg/dL — ABNORMAL LOW (ref 8.9–10.3)
Chloride: 94 mmol/L — ABNORMAL LOW (ref 98–111)
Creatinine, Ser: 1.17 mg/dL — ABNORMAL HIGH (ref 0.44–1.00)
GFR, Estimated: 44 mL/min — ABNORMAL LOW (ref >60)
Glucose, Bld: 103 mg/dL — ABNORMAL HIGH (ref 70–99)
Potassium: 4.3 mmol/L (ref 3.5–5.1)
Sodium: 136 mmol/L (ref 135–145)

## 2023-04-10 LAB — CBC
HCT: 31.9 % — ABNORMAL LOW (ref 36.0–46.0)
Hemoglobin: 9.9 g/dL — ABNORMAL LOW (ref 12.0–15.0)
MCH: 30.5 pg (ref 26.0–34.0)
MCHC: 31 g/dL (ref 30.0–36.0)
MCV: 98.2 fL (ref 80.0–100.0)
Platelets: 231 10*3/uL (ref 150–400)
RBC: 3.25 MIL/uL — ABNORMAL LOW (ref 3.87–5.11)
RDW: 20.3 % — ABNORMAL HIGH (ref 11.5–15.5)
WBC: 6.5 10*3/uL (ref 4.0–10.5)
nRBC: 0 % (ref 0.0–0.2)

## 2023-04-10 LAB — MAGNESIUM: Magnesium: 1.9 mg/dL (ref 1.7–2.4)

## 2023-04-10 MED ORDER — MIDODRINE HCL 10 MG PO TABS: 5.0000 mg | ORAL_TABLET | ORAL | Status: DC

## 2023-04-10 MED ORDER — OXYCODONE-ACETAMINOPHEN 5-325 MG PO TABS: 1.0000 | ORAL_TABLET | Freq: Three times a day (TID) | ORAL | 0 refills | Status: DC | PRN

## 2023-04-10 NOTE — Care Management Important Message (Signed)
 Important Message  Patient Details  Name: Felicia Benson MRN: [id number] Date of Birth: Nov 27, 1930   Important Message Given:  Yes - Medicare IM     Renie Ora 04/10/2023, 10:06 AM

## 2023-04-10 NOTE — Progress Notes (Signed)
 Dc instructions reviewed with patient. Piv dcd. Iv site unremarkable. Social worker will call [facility] for transport.

## 2023-04-10 NOTE — TOC Transition Note (Addendum)
 Transition of Care [facility]) - Discharge Note   Patient Details  Name: Felicia Benson MRN: [id number] Date of Birth: 02-Oct-1930  Transition of Care [facility]) CM/SW Contact:  Michaela Corner, LCSWA Phone Number: 04/10/2023, 10:03 AM   Clinical Narrative:   [facility] will [facility] to: [facility] Anticipated DC date: 04/10/23 Family notified: [name] (dtr) Transport by: Sharin Mons   Per MD patient ready for DC to [place]. RN to call report prior to discharge [phone]; room 207). RN, patient, patient's family, and facility notified of DC. Discharge Summary and FL2 sent to facility. DC packet on chart. Ambulance transport requested for patient 10:25AM.   CSW will sign off for now as social work intervention is no longer needed. Please consult [place] again if new needs arise.  10:58 AM CSW called [facility] back to inform them patient will no longer be going to DC lounge and will be in room for pickup, per the DC lounge.     Final next level of care: Skilled Nursing Facility Barriers to Discharge: Barriers Resolved   Patient Goals and CMS Choice Patient states their goals for this hospitalization and ongoing recovery are:: SNF          Discharge Placement  Patient will return to [place]. Per MD, pt will need palliative to follow patient at facility. CSW reached out to [place] to see who they partner with - per [facility] they work with [employer]. CSW notified [facility], they will be following patient at [place].             Patient chooses bed at: [facility] Patient to be transferred to facility by: [facility] Name of family member notified: [name] (dtr) Patient and family notified of of transfer: 04/10/23  Discharge Plan and Services Additional resources added to the After Visit Summary for   In-house Referral: Clinical Social Work                                   Social Drivers of Health (SDOH) Interventions SDOH Screenings   Food Insecurity: No Food Insecurity (04/08/2023)   Housing: Low Risk  (04/08/2023)  Transportation Needs: No Transportation Needs (04/08/2023)  Utilities: Not At Risk (04/08/2023)  Social Connections: Moderately Isolated (04/08/2023)  Tobacco Use: Medium Risk (04/07/2023)     Readmission Risk Interventions    07/24/2022    2:49 PM  Readmission Risk Prevention Plan  Transportation Screening Complete  PCP or Specialist Appt within 5-7 Days Complete  Home Care Screening Complete  Medication Review (RN CM) Complete

## 2023-04-10 NOTE — Progress Notes (Signed)
   [facility] Liaison Note:  Notified by [facility] manager of patient/family request for [facility] Palliative services at home after discharge.   Please call with any hospice or outpatient palliative care related questions.   Thank you for the opportunity to participate in this patient's care.   Glenna Fellows, BSN, RN, OCN [employer] [phone]

## 2023-04-10 NOTE — Discharge Summary (Signed)
 Physician Discharge Summary  Felicia Benson ZOX:[id number] DOB: 1930-07-08 DOA: 04/07/2023  PCP: Patient, No Pcp Per  Admit date: 04/07/2023 Discharge date: 04/10/2023  Admitted From: Skilled nursing facility  Discharge disposition: Skilled nursing facility   Recommendations for Outpatient Follow-Up:   Follow up with your primary care provider in one week.  Check CBC, BMP, magnesium in the next visit Consider continuation of follow-up with palliative care as outpatient in the skilled nursing facility/possible transition to hospice if deterioration.   Discharge Diagnosis:   Principal Problem:   Acute exacerbation of CHF (congestive heart failure) (HCC) Active Problems:   Myocardial injury   Sacral ulcer (HCC)   Hypokalemia   Goals of care, counseling/discussion   Discharge Condition: Improved.  Diet recommendation: Low sodium, heart healthy.  Fluid restriction 1500 mL/day.  Wound care: None.  Code status: DNR   History of Present Illness:   Felicia Benson is a 89 y.o. female with hx of heart failure with severe RV failure, LV preserved ejection fraction, CAD, PCI to RCA, heart block s/p pacemaker, aortic aneurysm, COPD with chronic hypoxic respiratory failure on 4 L O2, DVT/PE with IVC filter,not a candidate for anticoagulation due to history of GI bleeding., CKD stage IIIa, Crohn's, anemia of chronic disease, recent admission from 2/27 - 3/9 for respiratory failure related to exacerbation of heart failure brought in the hospital from skilled nursing facility due to intermittent hypoxia with pulse ox of 80s despite increasing oxygen to 6 L at the facility.  She had been having worsening peripheral edema.  Patient was then admitted hospital for further evaluation and treatment.    Hospital Course:   Following conditions were addressed during hospitalization as listed below,  Acute on chronic heart failure, severe RV failure, LV preserved EF Low output HF  Acute on  chronic hypoxic respiratory failure Patient with previous hospitalization for the same.  Last 2D echocardiogram from 2/25 with LV ejection fraction of 50 to 55% with severely decreased RV systolic function.  Was on torsemide 20 mg at home.  Physical Exam with anasarca.  BNP elevated at 2673 and troponin 103.  Chest x-ray with patchy bibasilar opacities.  Patient likely has end-stage heart failure with cool extremities.  Prognosis seems to be poor.  Received 40 mg of Lasix in the ED and received 40 mg IV daily.  Midodrine has been decreased to 5 mg 3 times daily.  Will continue reduced dose on discharge.  Continues to feel better and better compensated.  On 4L of oxygen by nasal canula today.  Will transition to oral torsemide on discharge.  Acute on chronic myocardial injury Elevated troponins but EKG without any ischemic changes or chest pain.  Overall poor prognosis.  Continue home aspirin, atorvastatin.   Stage III sacral pressure ulcer, present on admission Pressure Injury 11/12/22 Sacrum Mid Stage 3 -  Full thickness tissue loss. Subcutaneous fat may be visible but bone, tendon or muscle are NOT exposed. (Active)  11/12/22 1000  Location: Sacrum  Location Orientation: Mid  Staging: Stage 3 -  Full thickness tissue loss. Subcutaneous fat may be visible but bone, tendon or muscle are NOT exposed.  Wound Description (Comments):   Present on Admission: Yes     - Wound care continue   Hypokalemia, mild, asymptomatic Replenished.  Potassium today at 4.3.  Magnesium of 1.9.   CAD with history of RCA PCI Continue aspirin, statin.   Hyperlipidemia.  Continue statins.   Heart block with history of pacemaker:  COPD with CHRF on 4 L O2:  Currently on 4 L of oxygen.  Continue, albuterol every 4 hours as needed, continue home Dulera, singulair    History DVT/PE, IVC filter: Not candidate for anticoagulation due to history of GI bleeding   CKD stage IIIa: Creatinine today at 1.1.  Baseline Cr  near 1 - 1.2    Crohn's disease: Continue supportive care.   Anemia of chronic disease: Continue home iron   GERD: Continue home PPI.    Disposition.  At this time, patient is stable for disposition back to skilled nursing facility.  Consider palliative care or hospice at the facility if deteriorating.  Medical Consultants:   None.  Procedures:    None Subjective:   Today, patient was seen and examined at bedside.  Continues to feel better with breathing.  States that she is at her baseline and wishes to go back to her facility.  No chest pain, fever, chills or rigor.  Discharge Exam:   Vitals:   04/10/23 0358 04/10/23 0735  BP: 124/72 117/74  Pulse: 96 93  Resp: 19 18  Temp: (!) 97.5 F (36.4 C) 97.8 F (36.6 C)  SpO2: 97% 96%   Vitals:   04/09/23 2200 04/10/23 0013 04/10/23 0358 04/10/23 0735  BP:  112/68 124/72 117/74  Pulse: 80 92 96 93  Resp: (!) 24 18 19 18   Temp:  (!) 97.4 F (36.3 C) (!) 97.5 F (36.4 C) 97.8 F (36.6 C)  TempSrc:  Oral Oral Oral  SpO2: 94% 95% 97% 96%  Weight:  85.3 kg    Height:       Body mass index is 30.36 kg/m.   General: Alert awake, not in obvious distress, Communicative, oriented, chronically ill, on 4 L of oxygen by nasal cannula. HENT: pupils equally reacting to light,  No scleral pallor or icterus noted. Oral mucosa is moist.  Chest:    Diminished breath sounds bilaterally. No crackles or wheezes.  CVS: S1 &S2 heard. No murmur.  Regular rate and rhythm. Abdomen: Soft, nontender, nondistended.  Bowel sounds are heard.   Extremities: No cyanosis, clubbing with bilateral lower extremity edema.  Cool extremities. Psych: Alert, awake and oriented, normal mood CNS:  No cranial nerve deficits.  Power equal in all extremities.   Skin: Warm and dry.  Sacral ulcer on presentation.  The results of significant diagnostics from this hospitalization (including imaging, microbiology, ancillary and laboratory) are listed below for  reference.     Diagnostic Studies:   DG Hip Port [place] W or [place] Pelvis 1 View Left Result Date: 04/07/2023 CLINICAL DATA:  LEFT hip pain EXAM: DG HIP (WITH OR WITHOUT PELVIS) 1V PORT LEFT COMPARISON:  December 30, 2022 FINDINGS: Osteopenia. Status post ORIF of the RIGHT femur with revisualization of a displaced RIGHT lesser trochanter. No acute fracture or dislocation. Moderate joint space narrowing and mild osteophyte formation of the LEFT hip. No area of erosion or osseous destruction. No unexpected radiopaque foreign body. Diverticulosis. Sacrum is obscured by overlapping bowel contents. Degenerative changes of the lower lumbar spine. The filter. IMPRESSION: Moderate degenerative changes of the LEFT hip. If there is a persistent clinical concern for nondisplaced hip or pelvic fracture, recommend dedicated pelvic CT or MRI. Electronically Signed   By: Meda Klinefelter M.D.   On: 04/07/2023 17:42   DG Chest Port 1 View Result Date: 04/07/2023 CLINICAL DATA:  Shortness of breath EXAM: PORTABLE CHEST 1 VIEW COMPARISON:  March 07, 2023 FINDINGS: The cardiomediastinal  silhouette is unchanged in contour.Atherosclerotic calcifications of the aorta. LEFT chest cardiac pacing device. Favored trace bilateral pleural effusions. No pneumothorax. Patchy bibasilar reticulonodular opacities with peribronchial cuffing and mild interstitial prominence, similar in comparison to prior. IMPRESSION: 1. Similar appearance of patchy bibasilar reticulonodular opacities with peribronchial cuffing and mild interstitial prominence. Findings are favored to reflect a combination of atelectasis and edema. Superimposed infection could appear similar. 2. Favored trace bilateral pleural effusions. Electronically Signed   By: Meda Klinefelter M.D.   On: 04/07/2023 17:40     Labs:   Basic Metabolic Panel: Recent Labs  Lab 04/07/23 1711 04/07/23 1717 04/08/23 0323 04/09/23 0348 04/10/23 0254  NA 140 141 138 138 136  K  3.1* 3.1* 4.9 4.9 4.3  CL 93* 93* 99 95* 94*  CO2 36*  --  30 31 32  GLUCOSE 109* 106* 105* 99 103*  BUN 35* 35* 41* 46* 41*  CREATININE 1.18* 1.30* 1.24* 1.32* 1.17*  CALCIUM 8.6*  --  8.1* 8.3* 8.2*  MG 1.7  --  1.8 1.8 1.9  PHOS  --   --  3.8  --   --    GFR Estimated Creatinine Clearance: 35.2 mL/min (A) (by C-G formula based on SCr of 1.17 mg/dL (H)). Liver Function Tests: Recent Labs  Lab 04/07/23 1711  AST 21  ALT 13  ALKPHOS 65  BILITOT 1.4*  PROT 6.6  ALBUMIN 2.7*   No results for input(s): "LIPASE", "AMYLASE" in the last 168 hours. No results for input(s): "AMMONIA" in the last 168 hours. Coagulation profile No results for input(s): "INR", "PROTIME" in the last 168 hours.  CBC: Recent Labs  Lab 04/07/23 1711 04/07/23 1717 04/08/23 0323 04/09/23 0348 04/10/23 0254  WBC 7.7  --  7.6 6.7 6.5  NEUTROABS 6.3  --   --   --   --   HGB 11.4* 13.3 10.5* 9.9* 9.9*  HCT 37.2 39.0 34.2* 32.6* 31.9*  MCV 99.7  --  98.8 99.4 98.2  PLT 243  --  234 231 231   Cardiac Enzymes: No results for input(s): "CKTOTAL", "CKMB", "CKMBINDEX", "TROPONINI" in the last 168 hours. BNP: Invalid input(s): "POCBNP" CBG: No results for input(s): "GLUCAP" in the last 168 hours. D-Dimer No results for input(s): "DDIMER" in the last 72 hours. Hgb A1c No results for input(s): "HGBA1C" in the last 72 hours. Lipid Profile No results for input(s): "CHOL", "HDL", "LDLCALC", "TRIG", "CHOLHDL", "LDLDIRECT" in the last 72 hours. Thyroid function studies No results for input(s): "TSH", "T4TOTAL", "T3FREE", "THYROIDAB" in the last 72 hours.  Invalid input(s): "FREET3" Anemia work up No results for input(s): "VITAMINB12", "FOLATE", "FERRITIN", "TIBC", "IRON", "RETICCTPCT" in the last 72 hours. Microbiology No results found for this or any previous visit (from the past 240 hours).   Discharge Instructions:   Discharge Instructions     (HEART FAILURE PATIENTS) Call MD:  Anytime you have  any of the following symptoms: 1) 3 pound weight gain in 24 hours or 5 pounds in 1 week 2) shortness of breath, with or without a dry hacking cough 3) swelling in the hands, feet or stomach 4) if you have to sleep on extra pillows at night in order to breathe.   Complete by: As directed    Avoid straining   Complete by: As directed    Diet - low sodium heart healthy   Complete by: As directed    Fluid restriction 1500 mL/day.   Discharge instructions   Complete by: As  directed    Follow-up with your primary care provider at the skilled nursing facility in 3 to 5 days.  Check blood work at that time.  Please consider continuation of palliative care at the facility.  Continue oxygen by nasal cannula.  Please consider daily weights   Heart Failure patients record your daily weight using the same scale at the same time of day   Complete by: As directed    Increase activity slowly   Complete by: As directed    STOP any activity that causes chest pain, shortness of breath, dizziness, sweating, or exessive weakness   Complete by: As directed       Allergies as of 04/10/2023       Reactions   Motrin [ibuprofen] Other (See Comments)   GI bleed   Ace Inhibitors Cough   Breztri Aerosphere [budeson-glycopyrrol-formoterol] Hypertension   Cipro [ciprofloxacin Hcl] Other (See Comments)   Avoid fluoroquinolones due to asc-aortic aneurysm   Levaquin [levofloxacin] Other (See Comments)   Avoid fluoroquinolones due to asc-aortic aneurysm   Nsaids Nausea And Vomiting, Other (See Comments)   Hx of GI bleed        Medication List     TAKE these medications    acetaminophen 650 MG CR tablet Commonly known as: TYLENOL Take 1,300 mg by mouth daily.   acetaminophen 500 MG tablet Commonly known as: TYLENOL Take 1,000 mg by mouth at bedtime. Give with melatonin   albuterol 108 (90 Base) MCG/ACT inhaler Commonly known as: VENTOLIN HFA Inhale 2 puffs into the lungs every 6 (six) hours as needed  for wheezing or shortness of breath.   Aspercreme Lidocaine 4 % Generic drug: lidocaine Place 1 patch onto the skin daily as needed (knee pain).   aspirin EC 81 MG tablet Take 1 tablet (81 mg total) by mouth daily. Swallow whole.   atorvastatin 40 MG tablet Commonly known as: LIPITOR Take 40 mg by mouth at bedtime.   benzonatate 100 MG capsule Commonly known as: TESSALON Take 100 mg by mouth every 12 (twelve) hours as needed for cough.   cholecalciferol 25 MCG (1000 UNIT) tablet Commonly known as: VITAMIN D3 Take 1,000 Units by mouth daily.   docusate sodium 100 MG capsule Commonly known as: COLACE Take 1 capsule (100 mg total) by mouth daily. What changed: how much to take   ferrous sulfate 325 (65 FE) MG tablet Take 325 mg by mouth daily.   ipratropium-albuterol 0.5-2.5 (3) MG/3ML Soln Commonly known as: DUONEB Take 3 mLs by nebulization See admin instructions. "3mL inhale orally every 2 hours as needed for shortness of breath or wheezing for 3 days via nebulizer every 2-4 hours."   melatonin 3 MG Tabs tablet Take 9 mg by mouth at bedtime.   midodrine 10 MG tablet Commonly known as: PROAMATINE Take 0.5 tablets (5 mg total) by mouth See admin instructions. Give 0.5 tablet (5mg ) by mouth three times a day for hypotension. Hold if sBP > 130.   mometasone-formoterol 100-5 MCG/ACT Aero Commonly known as: DULERA Inhale 2 puffs into the lungs 2 (two) times daily.   montelukast 10 MG tablet Commonly known as: SINGULAIR Take 1 tablet (10 mg total) by mouth daily.   ondansetron 4 MG tablet Commonly known as: ZOFRAN Take 4 mg by mouth every 6 (six) hours as needed for nausea or vomiting.   oxyCODONE-acetaminophen 5-325 MG tablet Commonly known as: PERCOCET/ROXICET Take 1 tablet by mouth every 8 (eight) hours as needed for moderate pain (pain  score 4-6) or severe pain (pain score 7-10).   pantoprazole 40 MG tablet Commonly known as: PROTONIX Take 40 mg by mouth in the  morning and at bedtime.   Pentasa 500 MG CR capsule Generic drug: mesalamine Take 1,000 mg by mouth in the morning and at bedtime.   Pro-Stat Liqd Take 30 mLs by mouth daily.   torsemide 20 MG tablet Commonly known as: DEMADEX Take 1 tablet (20 mg total) by mouth daily.        Contact information for after-discharge care     Destination     [facility] .   Service: Skilled Nursing Contact information: [address] [place] [address] [phone]                      Time coordinating discharge: 39 minutes  Signed:  Ashtyn Meland  [facility] 04/10/2023, 9:21 AM

## 2023-04-11 ENCOUNTER — Ambulatory Visit (INDEPENDENT_AMBULATORY_CARE_PROVIDER_SITE_OTHER): Admitting: Podiatry

## 2023-04-11 ENCOUNTER — Encounter: Payer: Self-pay | Admitting: Podiatry

## 2023-04-11 DIAGNOSIS — N289 Disorder of kidney and ureter, unspecified: Secondary | ICD-10-CM

## 2023-04-11 DIAGNOSIS — M79675 Pain in left toe(s): Secondary | ICD-10-CM | POA: Diagnosis not present

## 2023-04-11 DIAGNOSIS — M79674 Pain in right toe(s): Secondary | ICD-10-CM

## 2023-04-11 DIAGNOSIS — B351 Tinea unguium: Secondary | ICD-10-CM

## 2023-04-11 DIAGNOSIS — D689 Coagulation defect, unspecified: Secondary | ICD-10-CM | POA: Diagnosis not present

## 2023-04-11 NOTE — Progress Notes (Signed)
 This patient returns to my office for at risk foot care.  This patient requires this care by a professional since this patient will be at risk due to having coagulation defect.  This patient is unable to cut nails herself since the patient cannot reach her nails.These nails are painful walking and wearing shoes.  She presents today in a wheelchair.This patient presents for at risk foot care today.  General Appearance  Alert, conversant and in no acute stress.  Vascular  Dorsalis pedis and posterior tibial  pulses are  weakly palpable  bilaterally.  Capillary return is within normal limits  bilaterally. Temperature is within normal limits  bilaterally.  Neurologic  Senn-Weinstein monofilament wire test within normal limits  bilaterally. Muscle power within normal limits bilaterally.  Nails Thick disfigured discolored nails with subungual debris  from hallux to fifth toes bilaterally. No evidence of bacterial infection or drainage bilaterally.  Orthopedic  No limitations of motion  feet .  No crepitus or effusions noted.  No bony pathology or digital deformities noted.  Skin  normotropic skin with no porokeratosis noted bilaterally.  No signs of infections or ulcers noted.     Onychomycosis  Pain in right toes  Pain in left toes  Consent was obtained for treatment procedures.   Mechanical debridement of nails 1-5  bilaterally performed with a nail nipper.  Filed with dremel without incident.    Return office visit     3 months                 Told patient to return for periodic foot care and evaluation due to potential at risk complications.   Helane Gunther DPM

## 2023-04-23 ENCOUNTER — Ambulatory Visit (HOSPITAL_BASED_OUTPATIENT_CLINIC_OR_DEPARTMENT_OTHER): Admitting: Family

## 2023-04-23 ENCOUNTER — Encounter: Payer: Self-pay | Admitting: Cardiology

## 2023-04-23 ENCOUNTER — Ambulatory Visit: Attending: Cardiology | Admitting: Cardiology

## 2023-04-23 VITALS — BP 130/86 | HR 86 | Ht 66.0 in | Wt 180.0 lb

## 2023-04-23 DIAGNOSIS — Z950 Presence of cardiac pacemaker: Secondary | ICD-10-CM | POA: Diagnosis present

## 2023-04-23 DIAGNOSIS — I442 Atrioventricular block, complete: Secondary | ICD-10-CM | POA: Diagnosis present

## 2023-04-23 DIAGNOSIS — I5032 Chronic diastolic (congestive) heart failure: Secondary | ICD-10-CM | POA: Insufficient documentation

## 2023-04-23 NOTE — Progress Notes (Signed)
  Electrophysiology Office Note:   Date:  04/23/2023  ID:  Felicia Benson, DOB 1930/05/10, MRN [id number]  Primary Cardiologist: Maudine Sos, MD Primary Heart Failure: None Electrophysiologist: Thorne Wirz Cortland Ding, MD      History of Present Illness:   Felicia Benson is a 89 y.o. female with h/o chronic diastolic heart failure, COPD, coronary artery disease, complete heart block seen today for routine electrophysiology followup.   Since last being seen in our clinic the patient reports doing overall well.  She has had multiple hospitalizations in the last few months.  She feels that her volume status is now significantly improving.  She is taking torsemide and has been losing weight.  She has no acute complaints otherwise.  she denies chest pain, palpitations, dyspnea, PND, orthopnea, nausea, vomiting, dizziness, syncope, edema, weight gain, or early satiety.   Review of systems complete and found to be negative unless listed in HPI.      EP Information / Studies Reviewed:    EKG is not ordered today. EKG from 04/08/23 reviewed which showed Ennis rhythm, ventricular paced      PPM Interrogation-  reviewed in detail today,  See PACEART report.  Device History: Medtronic Dual Chamber PPM implanted  for CHB  Risk Assessment/Calculations:              Physical Exam:   VS:  BP 130/86 (BP Location: Right Arm, Patient Position: Sitting, Cuff Size: Large)   Pulse 86   Ht 5\' 6"  (1.676 m)   Wt 180 lb (81.6 kg)   SpO2 90%   BMI 29.05 kg/m    Wt Readings from Last 3 Encounters:  04/23/23 180 lb (81.6 kg)  04/10/23 188 lb 1.6 oz (85.3 kg)  03/17/23 202 lb 13.2 oz (92 kg)     GEN: Well nourished, well developed in no acute distress NECK: No JVD; No carotid bruits CARDIAC: Regular rate and rhythm, no murmurs, rubs, gallops RESPIRATORY:  Clear to auscultation without rales, wheezing or rhonchi  ABDOMEN: Soft, non-tender, non-distended EXTREMITIES:  No edema; No deformity    ASSESSMENT AND PLAN:    CHB s/p Medtronic PPM  Normal PPM function See Pace Art report Sensing, threshold, impedance within normal limits Device programming reviewed and stable for patient No changes today  2.  Chronic diastolic heart failure: Has been diuresing well.  Is losing weight.  Shortness of breath is stable.  3.  Coronary artery disease: No current chest pain.  Rudolf Blizard arrange for follow-up with general cardiology.  Disposition:   Follow up with EP APP in 12 months  Signed, Kiearra Oyervides Cortland Ding, MD

## 2023-04-23 NOTE — Patient Instructions (Signed)
 Medication Instructions:  Your physician recommends that you continue on your current medications as directed. Please refer to the Current Medication list given to you today.  *If you need a refill on your cardiac medications before your next appointment, please call your pharmacy*  Lab Work: None ordered.  If you have labs (blood work) drawn today and your tests are completely normal, you will receive your results only by: MyChart Message (if you have MyChart) OR A paper copy in the mail If you have any lab test that is abnormal or we need to change your treatment, we will call you to review the results.  Testing/Procedures: None ordered.   Follow-Up: At [facility], you and your health needs are our priority.  As part of our continuing mission to provide you with exceptional heart care, our providers are all part of one team.  This team includes your primary Cardiologist (physician) and Advanced Practice Providers or APPs (Physician Assistants and Nurse Practitioners) who all work together to provide you with the care you need, when you need it.  Your next appointment:   Dr Lawana Pray in 12 months  Dr Theodis Fiscal 3 months   We recommend signing up for the patient portal called "MyChart".  Sign up information is provided on this After Visit Summary.  MyChart is used to connect with patients for Virtual Visits (Telemedicine).  Patients are able to view lab/test results, encounter notes, upcoming appointments, etc.  Non-urgent messages can be sent to your provider as well.   To learn more about what you can do with MyChart, go to [email or web address].         1st Floor: - Lobby - Registration  - Pharmacy  - Lab - Cafe  2nd Floor: - PV Lab - Diagnostic Testing (echo, CT, nuclear med)  3rd Floor: - Vacant  4th Floor: - TCTS (cardiothoracic surgery) - AFib Clinic - Structural Heart Clinic - Vascular Surgery  - Vascular Ultrasound  5th Floor: - HeartCare  Cardiology (general and EP) - Clinical Pharmacy for coumadin, hypertension, lipid, weight-loss medications, and med management appointments    Valet parking services will be available as well.

## 2023-04-24 LAB — CUP PACEART INCLINIC DEVICE CHECK
Date Time Interrogation Session: 20250415082050
Implantable Lead Connection Status: 753985
Implantable Lead Connection Status: 753985
Implantable Lead Implant Date: 20150818
Implantable Lead Implant Date: 20150818
Implantable Lead Location: 753859
Implantable Lead Location: 753860
Implantable Lead Model: 5076
Implantable Lead Model: 5076
Implantable Pulse Generator Implant Date: 20240703

## 2023-05-17 ENCOUNTER — Ambulatory Visit (INDEPENDENT_AMBULATORY_CARE_PROVIDER_SITE_OTHER): Admitting: Podiatrist

## 2023-05-17 ENCOUNTER — Encounter: Payer: Self-pay | Admitting: Podiatrist

## 2023-05-17 VITALS — Ht 66.0 in | Wt 180.0 lb

## 2023-05-17 DIAGNOSIS — L97511 Non-pressure chronic ulcer of other part of right foot limited to breakdown of skin: Secondary | ICD-10-CM

## 2023-05-17 DIAGNOSIS — L03031 Cellulitis of right toe: Secondary | ICD-10-CM | POA: Diagnosis not present

## 2023-05-17 NOTE — Patient Instructions (Signed)

## 2023-05-17 NOTE — Progress Notes (Signed)
 Chief Complaint  Patient presents with   Diabetes    Right hallux poss ingrown, redness and swelling - coming from a SNF     HPI: Patient is 89 y.o. female who presents today for pain and swelling right great toe with redness and swelling associated.  She has been on doxycycline  for 5 days with no change.  She is a patient currently at a skilled nursing facility. Patient Active Problem List   Diagnosis Date Noted   Myocardial injury 04/08/2023   Sacral ulcer (HCC) 04/08/2023   Hypokalemia 04/08/2023   Goals of care, counseling/discussion 04/08/2023   AKI (acute kidney injury) (HCC) 03/13/2023   Acute on chronic respiratory failure with hypoxia (HCC) 03/08/2023   Elevated troponin 03/08/2023   Acute exacerbation of CHF (congestive heart failure) (HCC) 03/07/2023   Right ventricular failure (HCC) 02/15/2023   Shortness of breath 02/13/2023   DVT (deep venous thrombosis) (HCC) 02/11/2023   Acute pulmonary embolism (HCC) 02/08/2023   Acute respiratory failure with hypoxia (HCC) 02/08/2023   CAP (community acquired pneumonia) 02/07/2023   Filling defect on imaging study 02/07/2023   CKD (chronic kidney disease) 02/07/2023   Pneumonia 02/07/2023   Diverticular hemorrhage 12/28/2022   Ileus, postoperative (HCC) 11/04/2022   Closed right hip fracture (HCC) 10/28/2022   Fall at home, initial encounter 10/28/2022   History of COPD 10/28/2022   Allergic rhinitis 10/28/2022   Anemia of chronic disease 10/28/2022   Chronic rhinitis 08/06/2022   OSA (obstructive sleep apnea) 08/06/2022   Heart failure, acute diastolic (HCC) 07/21/2022   CKD stage 3a, GFR 45-59 ml/min (HCC) 07/21/2022   Thoracic aortic aneurysm (HCC) 07/21/2022   GERD (gastroesophageal reflux disease) 07/21/2022   Pancreatic lesion 07/21/2022   Macular degeneration 07/21/2022   Hypocalcemia 07/21/2022   Insomnia 07/21/2022   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   TIA (transient ischemic attack) 12/31/2020   Acute  on chronic diastolic heart failure (HCC) 05/19/2019   Acute CHF (congestive heart failure) (HCC) 05/18/2019   Chronic hypoxic respiratory failure (HCC) 05/18/2019   COPD (chronic obstructive pulmonary disease) (HCC) 05/18/2019   History of cardiac pacemaker 05/18/2019   AAA (abdominal aortic aneurysm) 05/18/2019   Hyperlipidemia 05/18/2019   Abdominal pain in female 05/30/2015   Essential hypertension 05/30/2015   Crohn's disease (HCC) 05/30/2015   Gastrointestinal hemorrhage with melena 05/30/2015   Blood loss anemia 05/30/2015   Hyponatremia 05/30/2015   Kidney disease 05/30/2015   Normocytic anemia 05/30/2015   CAD S/P percutaneous coronary angioplasty 05/30/2015   Acute blood loss anemia 05/30/2015   Heme + stool     Current Outpatient Medications on File Prior to Visit  Medication Sig Dispense Refill   acetaminophen  (TYLENOL ) 500 MG tablet Take 1,000 mg by mouth at bedtime. Give with melatonin     albuterol  (VENTOLIN  HFA) 108 (90 Base) MCG/ACT inhaler Inhale 2 puffs into the lungs every 6 (six) hours as needed for wheezing or shortness of breath. 8 g 2   aspirin  EC 81 MG tablet Take 1 tablet (81 mg total) by mouth daily. Swallow whole. 30 tablet 11   atorvastatin  (LIPITOR) 40 MG tablet Take 40 mg by mouth at bedtime.     benzonatate  (TESSALON ) 100 MG capsule Take 100 mg by mouth every 12 (twelve) hours as needed for cough.     cholecalciferol  (VITAMIN D3) 25 MCG (1000 UNIT) tablet Take 1,000 Units by mouth daily.     docusate sodium  (COLACE) 100 MG capsule Take 1 capsule (100 mg total)  by mouth daily. (Patient taking differently: Take 200 mg by mouth daily.) 10 capsule 0   ferrous sulfate  325 (65 FE) MG tablet Take 325 mg by mouth daily.     ipratropium-albuterol  (DUONEB) 0.5-2.5 (3) MG/3ML SOLN Take 3 mLs by nebulization See admin instructions. "3mL inhale orally every 2 hours as needed for shortness of breath or wheezing for 3 days via nebulizer every 2-4 hours."     lidocaine   (ASPERCREME LIDOCAINE ) 4 % Place 1 patch onto the skin daily as needed (knee pain).     melatonin 3 MG TABS tablet Take 9 mg by mouth at bedtime.     midodrine  (PROAMATINE ) 10 MG tablet Take 0.5 tablets (5 mg total) by mouth See admin instructions. Give 0.5 tablet (5mg ) by mouth three times a day for hypotension. Hold if sBP > 130.     mometasone -formoterol  (DULERA ) 100-5 MCG/ACT AERO Inhale 2 puffs into the lungs 2 (two) times daily.     montelukast  (SINGULAIR ) 10 MG tablet Take 1 tablet (10 mg total) by mouth daily. 90 tablet 1   ondansetron  (ZOFRAN ) 4 MG tablet Take 4 mg by mouth every 6 (six) hours as needed for nausea or vomiting.     oxyCODONE -acetaminophen  (PERCOCET/ROXICET) 5-325 MG tablet Take 1 tablet by mouth every 8 (eight) hours as needed for moderate pain (pain score 4-6) or severe pain (pain score 7-10). 6 tablet 0   pantoprazole  (PROTONIX ) 40 MG tablet Take 40 mg by mouth in the morning and at bedtime.     PENTASA  500 MG CR capsule Take 1,000 mg by mouth in the morning and at bedtime.     torsemide  (DEMADEX ) 20 MG tablet Take 1 tablet (20 mg total) by mouth daily.     No current facility-administered medications on file prior to visit.    Allergies  Allergen Reactions   Motrin [Ibuprofen] Other (See Comments)    GI bleed   Ace Inhibitors Cough   Breztri  Aerosphere [Budeson-Glycopyrrol-Formoterol ] Hypertension   Cipro [Ciprofloxacin Hcl] Other (See Comments)    Avoid fluoroquinolones due to asc-aortic aneurysm   Levaquin [Levofloxacin] Other (See Comments)    Avoid fluoroquinolones due to asc-aortic aneurysm   Nsaids Nausea And Vomiting and Other (See Comments)    Hx of GI bleed    Review of Systems No fevers, chills, nausea, muscle aches, no difficulty breathing, no calf pain, no chest pain or shortness of breath.   Physical Exam  GENERAL APPEARANCE: Alert, conversant. Appropriately groomed. No acute distress.   VASCULAR: Pedal pulses palpable 1/4 DP and 1/4 PT  right.  Capillary refill time is immediate to all digits,  Proximal to distal cooling is warm to warm.  Digital perfusion adequate.   NEUROLOGIC: sensation is intact to 5.07 monofilament at 5/5 sites right light touch is intact   DERMATOLOGIC: Right hallux swollen and red.  Superficial scab is present at the distal tip of the toe.  Toenail is thickened, loose at the distal portion as well.    there is a well-circumscribed ulceration of the distal aspect of the toe measuring 0.5 cm in diameter 0.2 cm in depth.  Red granular base is present.  No probing is noted.   Assessment     ICD-10-CM   1. Skin ulcer of right great toe, limited to breakdown of skin [facility])  L97.511        Plan  Exam and treatment recommendations discussed with Elinor Guardian and her caregiver.  Recommended a nonpermanent removal of the toenail and  she agreed.  Up of the skin with alcohol  infiltrated one-to-one mix of 0.5% lidocaine  plain and Marcaine  plain in a digital block fashion.  The toe was then prepped and exsanguinated.  A well-circumscribed ulceration was identified at the distal tip of the toe and under the distal tip of the nail itself.  The nail was also carefully removed without complication.  Antibiotic ointment and a dry and sterile compressive dressing was applied instructions for Epsom salt soaks daily for 10 days as well as instructions for use of mupirocin ointment written out.  Recommend continue doxycycline  for 5 more days and return for recheck in 1 to 2 weeks.  If any redness, drainage, swelling arise prior to that visit she instructed to call.

## 2023-05-29 ENCOUNTER — Ambulatory Visit (INDEPENDENT_AMBULATORY_CARE_PROVIDER_SITE_OTHER): Admitting: Podiatrist

## 2023-05-29 ENCOUNTER — Encounter: Payer: Self-pay | Admitting: Podiatrist

## 2023-05-29 DIAGNOSIS — L97511 Non-pressure chronic ulcer of other part of right foot limited to breakdown of skin: Secondary | ICD-10-CM | POA: Diagnosis not present

## 2023-05-29 NOTE — Progress Notes (Signed)
 Chief Complaint  Patient presents with   Wound Check    "It's doing good.  I have a couple of more days of soaking."     HPI: Patient is 89 y.o. female who presents today for follow up of the right great toenail and skin ulcer at the tip of the toe. She relates it is feeling ok. She has been soaking the toe and applying antibiotic ointment.  At her SNF with the help of her nurses.     Review of Systems No fevers, chills, nausea, muscle aches, no difficulty breathing, no calf pain, no chest pain or shortness of breath.   Physical Exam  GENERAL APPEARANCE: Alert, conversant. Appropriately groomed. No acute distress.   VASCULAR: Pedal pulses palpable 1/4 DP and 1/4 PT right.  Capillary refill time is immediate to all digits,  Proximal to distal cooling is warm to warm.  Digital perfusion adequate.   NEUROLOGIC: sensation is intact to 5.07 monofilament at 5/5 sites right light touch is intact   DERMATOLOGIC: improvement of the right hallux is noted. The redness and swelling has gone down and there is still a small ulcer at the tip of the toe.  Today it measures 0.3 cm in diameter and superficial depth. Small amount of pus at the tip of the toe found with a small pocket which is debrided away.  Red granular base underlying is noted.  ASSESSMENT:     ICD-10-CM   1. Skin ulcer of right great toe, limited to breakdown of skin (HCC)  L97.511      PLAN:   Debridement of the ulcer is performed and necrotic tissue debrided.  Very small wound remains. No probing deep or to bone noted.   Order for her Wound care nurse will check daily and apply antibiotic ointment and dressing daily.  If the ulcer does not improve, she will call if not better in 2 weeks. -  order written on sheet for SNF

## 2023-06-10 ENCOUNTER — Ambulatory Visit: Payer: Self-pay | Admitting: Cardiology

## 2023-06-10 ENCOUNTER — Ambulatory Visit (INDEPENDENT_AMBULATORY_CARE_PROVIDER_SITE_OTHER)

## 2023-06-10 DIAGNOSIS — I442 Atrioventricular block, complete: Secondary | ICD-10-CM

## 2023-06-10 LAB — CUP PACEART REMOTE DEVICE CHECK
Battery Remaining Longevity: 127 mo
Battery Voltage: 3.04 V
Brady Statistic AP VP Percent: 28.96 %
Brady Statistic AP VS Percent: 0 %
Brady Statistic AS VP Percent: 71.01 %
Brady Statistic AS VS Percent: 0.03 %
Brady Statistic RA Percent Paced: 28.71 %
Brady Statistic RV Percent Paced: 99.97 %
Date Time Interrogation Session: 20250602021720
Implantable Lead Connection Status: 753985
Implantable Lead Connection Status: 753985
Implantable Lead Implant Date: 20150818
Implantable Lead Implant Date: 20150818
Implantable Lead Location: 753859
Implantable Lead Location: 753860
Implantable Lead Model: 5076
Implantable Lead Model: 5076
Implantable Pulse Generator Implant Date: 20240703
Lead Channel Impedance Value: 247 Ohm
Lead Channel Impedance Value: 285 Ohm
Lead Channel Impedance Value: 304 Ohm
Lead Channel Impedance Value: 380 Ohm
Lead Channel Pacing Threshold Amplitude: 0.625 V
Lead Channel Pacing Threshold Amplitude: 1 V
Lead Channel Pacing Threshold Pulse Width: 0.4 ms
Lead Channel Pacing Threshold Pulse Width: 0.4 ms
Lead Channel Sensing Intrinsic Amplitude: 2 mV
Lead Channel Sensing Intrinsic Amplitude: 2 mV
Lead Channel Sensing Intrinsic Amplitude: 7 mV
Lead Channel Sensing Intrinsic Amplitude: 7 mV
Lead Channel Setting Pacing Amplitude: 1.5 V
Lead Channel Setting Pacing Amplitude: 2 V
Lead Channel Setting Pacing Pulse Width: 0.4 ms
Lead Channel Setting Sensing Sensitivity: 1.2 mV
Zone Setting Status: 755011

## 2023-06-25 ENCOUNTER — Emergency Department (HOSPITAL_COMMUNITY)

## 2023-06-25 ENCOUNTER — Encounter (HOSPITAL_COMMUNITY): Payer: Self-pay

## 2023-06-25 ENCOUNTER — Other Ambulatory Visit: Payer: Self-pay

## 2023-06-25 ENCOUNTER — Inpatient Hospital Stay (HOSPITAL_COMMUNITY)
Admission: EM | Admit: 2023-06-25 | Discharge: 2023-07-01 | DRG: 291 | Disposition: A | Discharge status: Skilled Nursing Facility | Source: Skilled Nursing Facility | Attending: Family Medicine | Admitting: Family Medicine

## 2023-06-25 DIAGNOSIS — Z9049 Acquired absence of other specified parts of digestive tract: Secondary | ICD-10-CM

## 2023-06-25 DIAGNOSIS — Z86718 Personal history of other venous thrombosis and embolism: Secondary | ICD-10-CM

## 2023-06-25 DIAGNOSIS — K921 Melena: Secondary | ICD-10-CM | POA: Diagnosis present

## 2023-06-25 DIAGNOSIS — Z87891 Personal history of nicotine dependence: Secondary | ICD-10-CM | POA: Diagnosis not present

## 2023-06-25 DIAGNOSIS — Z9981 Dependence on supplemental oxygen: Secondary | ICD-10-CM

## 2023-06-25 DIAGNOSIS — L89153 Pressure ulcer of sacral region, stage 3: Secondary | ICD-10-CM | POA: Diagnosis present

## 2023-06-25 DIAGNOSIS — E785 Hyperlipidemia, unspecified: Secondary | ICD-10-CM | POA: Diagnosis present

## 2023-06-25 DIAGNOSIS — K219 Gastro-esophageal reflux disease without esophagitis: Secondary | ICD-10-CM | POA: Diagnosis present

## 2023-06-25 DIAGNOSIS — J441 Chronic obstructive pulmonary disease with (acute) exacerbation: Secondary | ICD-10-CM | POA: Diagnosis present

## 2023-06-25 DIAGNOSIS — Z886 Allergy status to analgesic agent status: Secondary | ICD-10-CM

## 2023-06-25 DIAGNOSIS — I714 Abdominal aortic aneurysm, without rupture, unspecified: Secondary | ICD-10-CM | POA: Diagnosis present

## 2023-06-25 DIAGNOSIS — Z7982 Long term (current) use of aspirin: Secondary | ICD-10-CM

## 2023-06-25 DIAGNOSIS — Z950 Presence of cardiac pacemaker: Secondary | ICD-10-CM

## 2023-06-25 DIAGNOSIS — M25561 Pain in right knee: Secondary | ICD-10-CM | POA: Diagnosis not present

## 2023-06-25 DIAGNOSIS — Z66 Do not resuscitate: Secondary | ICD-10-CM | POA: Diagnosis present

## 2023-06-25 DIAGNOSIS — G4733 Obstructive sleep apnea (adult) (pediatric): Secondary | ICD-10-CM | POA: Diagnosis present

## 2023-06-25 DIAGNOSIS — I10 Essential (primary) hypertension: Secondary | ICD-10-CM | POA: Diagnosis present

## 2023-06-25 DIAGNOSIS — I130 Hypertensive heart and chronic kidney disease with heart failure and stage 1 through stage 4 chronic kidney disease, or unspecified chronic kidney disease: Principal | ICD-10-CM | POA: Diagnosis present

## 2023-06-25 DIAGNOSIS — J9621 Acute and chronic respiratory failure with hypoxia: Secondary | ICD-10-CM | POA: Diagnosis present

## 2023-06-25 DIAGNOSIS — Z7951 Long term (current) use of inhaled steroids: Secondary | ICD-10-CM | POA: Diagnosis not present

## 2023-06-25 DIAGNOSIS — K5731 Diverticulosis of large intestine without perforation or abscess with bleeding: Secondary | ICD-10-CM | POA: Diagnosis present

## 2023-06-25 DIAGNOSIS — Z955 Presence of coronary angioplasty implant and graft: Secondary | ICD-10-CM

## 2023-06-25 DIAGNOSIS — I2510 Atherosclerotic heart disease of native coronary artery without angina pectoris: Secondary | ICD-10-CM | POA: Diagnosis present

## 2023-06-25 DIAGNOSIS — Z79899 Other long term (current) drug therapy: Secondary | ICD-10-CM

## 2023-06-25 DIAGNOSIS — R7989 Other specified abnormal findings of blood chemistry: Secondary | ICD-10-CM

## 2023-06-25 DIAGNOSIS — Z95828 Presence of other vascular implants and grafts: Secondary | ICD-10-CM

## 2023-06-25 DIAGNOSIS — N179 Acute kidney failure, unspecified: Secondary | ICD-10-CM | POA: Diagnosis present

## 2023-06-25 DIAGNOSIS — R051 Acute cough: Secondary | ICD-10-CM

## 2023-06-25 DIAGNOSIS — M25562 Pain in left knee: Secondary | ICD-10-CM | POA: Diagnosis not present

## 2023-06-25 DIAGNOSIS — K5090 Crohn's disease, unspecified, without complications: Secondary | ICD-10-CM | POA: Diagnosis present

## 2023-06-25 DIAGNOSIS — I495 Sick sinus syndrome: Secondary | ICD-10-CM | POA: Diagnosis present

## 2023-06-25 DIAGNOSIS — R0602 Shortness of breath: Principal | ICD-10-CM

## 2023-06-25 DIAGNOSIS — R0902 Hypoxemia: Secondary | ICD-10-CM

## 2023-06-25 DIAGNOSIS — I5033 Acute on chronic diastolic (congestive) heart failure: Secondary | ICD-10-CM | POA: Diagnosis present

## 2023-06-25 DIAGNOSIS — I82402 Acute embolism and thrombosis of unspecified deep veins of left lower extremity: Secondary | ICD-10-CM | POA: Diagnosis present

## 2023-06-25 DIAGNOSIS — N1831 Chronic kidney disease, stage 3a: Secondary | ICD-10-CM | POA: Diagnosis present

## 2023-06-25 DIAGNOSIS — Z9851 Tubal ligation status: Secondary | ICD-10-CM

## 2023-06-25 DIAGNOSIS — Z881 Allergy status to other antibiotic agents status: Secondary | ICD-10-CM

## 2023-06-25 DIAGNOSIS — Z86711 Personal history of pulmonary embolism: Secondary | ICD-10-CM

## 2023-06-25 DIAGNOSIS — I712 Thoracic aortic aneurysm, without rupture, unspecified: Secondary | ICD-10-CM | POA: Diagnosis present

## 2023-06-25 DIAGNOSIS — Z8673 Personal history of transient ischemic attack (TIA), and cerebral infarction without residual deficits: Secondary | ICD-10-CM

## 2023-06-25 DIAGNOSIS — I272 Pulmonary hypertension, unspecified: Secondary | ICD-10-CM | POA: Diagnosis present

## 2023-06-25 DIAGNOSIS — Z888 Allergy status to other drugs, medicaments and biological substances status: Secondary | ICD-10-CM

## 2023-06-25 DIAGNOSIS — J449 Chronic obstructive pulmonary disease, unspecified: Secondary | ICD-10-CM | POA: Diagnosis present

## 2023-06-25 DIAGNOSIS — N1832 Chronic kidney disease, stage 3b: Secondary | ICD-10-CM | POA: Diagnosis present

## 2023-06-25 LAB — CBC
HCT: 36 % (ref 36.0–46.0)
Hemoglobin: 11.1 g/dL — ABNORMAL LOW (ref 12.0–15.0)
MCH: 30.1 pg (ref 26.0–34.0)
MCHC: 30.8 g/dL (ref 30.0–36.0)
MCV: 97.6 fL (ref 80.0–100.0)
Platelets: 259 10*3/uL (ref 150–400)
RBC: 3.69 MIL/uL — ABNORMAL LOW (ref 3.87–5.11)
RDW: 17.7 % — ABNORMAL HIGH (ref 11.5–15.5)
WBC: 6.4 10*3/uL (ref 4.0–10.5)
nRBC: 0 % (ref 0.0–0.2)

## 2023-06-25 LAB — COMPREHENSIVE METABOLIC PANEL WITH GFR
ALT: 17 U/L (ref 0–44)
AST: 26 U/L (ref 15–41)
Albumin: 2.3 g/dL — ABNORMAL LOW (ref 3.5–5.0)
Alkaline Phosphatase: 71 U/L (ref 38–126)
Anion gap: 12 (ref 5–15)
BUN: 44 mg/dL — ABNORMAL HIGH (ref 8–23)
CO2: 32 mmol/L (ref 22–32)
Calcium: 8.1 mg/dL — ABNORMAL LOW (ref 8.9–10.3)
Chloride: 92 mmol/L — ABNORMAL LOW (ref 98–111)
Creatinine, Ser: 1.63 mg/dL — ABNORMAL HIGH (ref 0.44–1.00)
GFR, Estimated: 30 mL/min — ABNORMAL LOW (ref >60)
Glucose, Bld: 99 mg/dL (ref 70–99)
Potassium: 4.6 mmol/L (ref 3.5–5.1)
Sodium: 136 mmol/L (ref 135–145)
Total Bilirubin: 0.6 mg/dL (ref 0.0–1.2)
Total Protein: 6.4 g/dL — ABNORMAL LOW (ref 6.5–8.1)

## 2023-06-25 LAB — I-STAT CG4 LACTIC ACID, ED: Lactic Acid, Venous: 1 mmol/L (ref 0.5–1.9)

## 2023-06-25 LAB — TROPONIN I (HIGH SENSITIVITY)
Troponin I (High Sensitivity): 41 ng/L — ABNORMAL HIGH (ref <18)
Troponin I (High Sensitivity): 41 ng/L — ABNORMAL HIGH (ref <18)

## 2023-06-25 LAB — RESP PANEL BY RT-PCR (RSV, FLU A&B, COVID)  RVPGX2
Influenza A by PCR: NEGATIVE
Influenza B by PCR: NEGATIVE
Resp Syncytial Virus by PCR: NEGATIVE
SARS Coronavirus 2 by RT PCR: NEGATIVE

## 2023-06-25 LAB — BRAIN NATRIURETIC PEPTIDE: B Natriuretic Peptide: 2051.6 pg/mL — ABNORMAL HIGH (ref 0.0–100.0)

## 2023-06-25 MED ORDER — ALBUTEROL SULFATE (2.5 MG/3ML) 0.083% IN NEBU
2.5000 mg | INHALATION_SOLUTION | RESPIRATORY_TRACT | Status: DC | PRN
Start: 2023-06-25 — End: 2023-06-29
  Administered 2023-06-28 – 2023-06-29 (×3): 2.5 mg via RESPIRATORY_TRACT
  Filled 2023-06-25 (×3): qty 3

## 2023-06-25 MED ORDER — IPRATROPIUM BROMIDE 0.02 % IN SOLN
0.5000 mg | Freq: Four times a day (QID) | RESPIRATORY_TRACT | Status: DC
  Administered 2023-06-25 – 2023-06-26 (×2): 0.5 mg via RESPIRATORY_TRACT
  Filled 2023-06-25 (×3): qty 2.5

## 2023-06-25 MED ORDER — ALBUTEROL SULFATE (2.5 MG/3ML) 0.083% IN NEBU
2.5000 mg | INHALATION_SOLUTION | Freq: Four times a day (QID) | RESPIRATORY_TRACT | Status: DC
  Administered 2023-06-25 – 2023-06-26 (×2): 2.5 mg via RESPIRATORY_TRACT
  Filled 2023-06-25 (×3): qty 3

## 2023-06-25 MED ORDER — ENOXAPARIN SODIUM 30 MG/0.3ML IJ SOSY
30.0000 mg | PREFILLED_SYRINGE | INTRAMUSCULAR | Status: DC
  Administered 2023-06-26 – 2023-06-30 (×5): 30 mg via SUBCUTANEOUS
  Filled 2023-06-25 (×5): qty 0.3

## 2023-06-25 MED ORDER — ONDANSETRON HCL 4 MG/2ML IJ SOLN: 4.0000 mg | Freq: Four times a day (QID) | INTRAMUSCULAR | Status: DC | PRN

## 2023-06-25 MED ORDER — ONDANSETRON HCL 4 MG PO TABS: 4.0000 mg | ORAL_TABLET | Freq: Four times a day (QID) | ORAL | Status: DC | PRN

## 2023-06-25 NOTE — ED Triage Notes (Signed)
 BIB EMS cough x 1 month. Hx of COPD. Finished antibiotics today. Cough has not improved and sent for higher level of care. Pt is on 4l at all times EMS increased to 6lnc for sats in the upper 80s.   EMS VS 122/78 94% on 6l/[place] 97.9 temp

## 2023-06-25 NOTE — ED Notes (Signed)
Patient is resting comfortably in NAD at this time.

## 2023-06-25 NOTE — ED Provider Notes (Signed)
 [place] EMERGENCY DEPARTMENT AT [facility] Provider Note   CSN: [id number] Arrival date & time: 06/25/23  1424     Patient presents with: Cough and Shortness of Breath   Felicia Benson is a 89 y.o. female.   The history is provided by the patient and medical records. No language interpreter was used.  Cough Cough characteristics:  Productive Sputum characteristics:  Nondescript Severity:  Severe Onset quality:  Gradual Duration:  4 weeks Timing:  Constant Progression:  Worsening Chronicity:  Recurrent Relieved by:  Nothing Worsened by:  Nothing Ineffective treatments:  None tried Associated symptoms: chills, shortness of breath and wheezing   Associated symptoms: no chest pain, no fever, no headaches, no rash and no rhinorrhea   Risk factors: recent infection (recent ABX for cough)   Shortness of Breath Severity:  Severe Onset quality:  Gradual Duration:  4 weeks Timing:  Intermittent Progression:  Waxing and waning Chronicity:  Recurrent Context: URI   Relieved by:  Nothing Worsened by:  Coughing Ineffective treatments:  None tried Associated symptoms: cough, sputum production and wheezing   Associated symptoms: no abdominal pain, no chest pain, no fever, no headaches, no neck pain, no rash and no vomiting   Risk factors: hx of PE/DVT        Prior to Admission medications   Medication Sig Start Date End Date Taking? Authorizing Provider  acetaminophen  (TYLENOL ) 500 MG tablet Take 1,000 mg by mouth at bedtime. Give with melatonin    [provider]  albuterol  (VENTOLIN  HFA) 108 (90 Base) MCG/ACT inhaler Inhale 2 puffs into the lungs every 6 (six) hours as needed for wheezing or shortness of breath. 07/24/22   Justina Oman, MD  aspirin  EC 81 MG tablet Take 1 tablet (81 mg total) by mouth daily. Swallow whole. 03/29/21   Lisabeth Rider, MD  atorvastatin  (LIPITOR) 40 MG tablet Take 40 mg by mouth at bedtime.    [provider]   benzonatate  (TESSALON ) 100 MG capsule Take 100 mg by mouth every 12 (twelve) hours as needed for cough.    [provider]  cholecalciferol  (VITAMIN D3) 25 MCG (1000 UNIT) tablet Take 1,000 Units by mouth daily.    [provider]  docusate sodium  (COLACE) 100 MG capsule Take 1 capsule (100 mg total) by mouth daily. Patient taking differently: Take 200 mg by mouth daily. 01/04/23   Daren Eck, DO  ferrous sulfate  325 (65 FE) MG tablet Take 325 mg by mouth daily.    [provider]  ipratropium-albuterol  (DUONEB) 0.5-2.5 (3) MG/3ML SOLN Take 3 mLs by nebulization See admin instructions. 3mL inhale orally every 2 hours as needed for shortness of breath or wheezing for 3 days via nebulizer every 2-4 hours.    [provider]  lidocaine  (ASPERCREME LIDOCAINE ) 4 % Place 1 patch onto the skin daily as needed (knee pain).    [provider]  melatonin 3 MG TABS tablet Take 9 mg by mouth at bedtime.    [provider]  midodrine  (PROAMATINE ) 10 MG tablet Take 0.5 tablets (5 mg total) by mouth See admin instructions. Give 0.5 tablet (5mg ) by mouth three times a day for hypotension. Hold if sBP > 130. 04/10/23   Pokhrel, Laxman, MD  mometasone -formoterol  (DULERA ) 100-5 MCG/ACT AERO Inhale 2 puffs into the lungs 2 (two) times daily.    [provider]  montelukast  (SINGULAIR ) 10 MG tablet Take 1 tablet (10 mg total) by mouth daily. 08/01/21  [name], Adewale A, MD  ondansetron  (ZOFRAN ) 4 MG tablet Take 4 mg by mouth every 6 (six) hours as needed for nausea or vomiting.    [provider]  oxyCODONE -acetaminophen  (PERCOCET/ROXICET) 5-325 MG tablet Take 1 tablet by mouth every 8 (eight) hours as needed for moderate pain (pain score 4-6) or severe pain (pain score 7-10). 04/10/23   Pokhrel, Laxman, MD  pantoprazole  (PROTONIX ) 40 MG tablet Take 40 mg by mouth in the morning and at bedtime.    [provider]  PENTASA  500 MG CR  capsule Take 1,000 mg by mouth in the morning and at bedtime.    [provider]  torsemide  (DEMADEX ) 20 MG tablet Take 1 tablet (20 mg total) by mouth daily. 03/18/23   Lesa Rape, MD    Allergies: Motrin [ibuprofen], Ace inhibitors, Breztri  aerosphere [budeson-glycopyrrol-formoterol ], Cipro [ciprofloxacin hcl], Levaquin [levofloxacin], and Nsaids    Review of Systems  Constitutional:  Positive for chills and fatigue. Negative for fever.  HENT:  Negative for congestion and rhinorrhea.   Respiratory:  Positive for cough, sputum production, shortness of breath and wheezing. Negative for chest tightness and stridor.   Cardiovascular:  Positive for leg swelling (chronic). Negative for chest pain and palpitations.  Gastrointestinal:  Negative for abdominal pain, constipation, diarrhea, nausea and vomiting.  Genitourinary:  Negative for dysuria, flank pain and frequency.  Musculoskeletal:  Negative for back pain, neck pain and neck stiffness.  Skin:  Negative for rash and wound.  Neurological:  Negative for dizziness, weakness, light-headedness, numbness and headaches.  Psychiatric/Behavioral:  Negative for agitation and confusion.   All other systems reviewed and are negative.   Updated Vital Signs BP 127/85 (BP Location: Left Arm)   Pulse 95   Temp (!) 97.4 F (36.3 C)   Resp 18   SpO2 93%   Physical Exam Vitals and nursing note reviewed.  Constitutional:      General: She is not in acute distress.    Appearance: She is well-developed. She is not ill-appearing, toxic-appearing or diaphoretic.  HENT:     Head: Normocephalic and atraumatic.     Mouth/Throat:     Mouth: Mucous membranes are moist.   Eyes:     Conjunctiva/sclera: Conjunctivae normal.     Pupils: Pupils are equal, round, and reactive to light.    Cardiovascular:     Rate and Rhythm: Normal rate and regular rhythm.     Heart sounds: No murmur heard. Pulmonary:     Effort: Pulmonary effort is normal.  Tachypnea present. No respiratory distress.     Breath sounds: Rhonchi and rales present. No wheezing.  Chest:     Chest wall: No tenderness.  Abdominal:     Palpations: Abdomen is soft.     Tenderness: There is no abdominal tenderness.   Musculoskeletal:        General: No swelling.     Cervical back: Neck supple.     Right lower leg: No tenderness. Edema present.     Left lower leg: No tenderness. Edema present.   Skin:    General: Skin is warm and dry.     Capillary Refill: Capillary refill takes less than 2 seconds.     Findings: No erythema.   Neurological:     Mental Status: She is alert.   Psychiatric:        Mood and Affect: Mood normal.     (all labs ordered are listed, but only abnormal results are displayed) Labs Reviewed  COMPREHENSIVE METABOLIC PANEL WITH GFR - Abnormal; Notable for the following components:      Result Value   Chloride 92 (*)    BUN 44 (*)    Creatinine, Ser 1.63 (*)    Calcium  8.1 (*)    Total Protein 6.4 (*)    Albumin 2.3 (*)    GFR, Estimated 30 (*)    All other components within normal limits  CBC - Abnormal; Notable for the following components:   RBC 3.69 (*)    Hemoglobin 11.1 (*)    RDW 17.7 (*)    All other components within normal limits  BRAIN NATRIURETIC PEPTIDE - Abnormal; Notable for the following components:   B Natriuretic Peptide 2,051.6 (*)    All other components within normal limits  TROPONIN I (HIGH SENSITIVITY) - Abnormal; Notable for the following components:   Troponin I (High Sensitivity) 41 (*)    All other components within normal limits  TROPONIN I (HIGH SENSITIVITY) - Abnormal; Notable for the following components:   Troponin I (High Sensitivity) 41 (*)    All other components within normal limits  RESP PANEL BY RT-PCR (RSV, FLU A&B, COVID)  [id number]  CBC  CREATININE, SERUM  CBC  I-STAT CG4 LACTIC ACID, ED  I-STAT CG4 LACTIC ACID, ED    EKG: EKG Interpretation Date/Time:  Tuesday June 25 2023  16:51:25 EDT Ventricular Rate:  98 PR Interval:  168 QRS Duration:  177 QT Interval:  424 QTC Calculation: 542 R Axis:   255  Text Interpretation: paced Probable left atrial enlargement Nonspecific IVCD with LAD , paced when compared to prior, similar appearance no STEMI Confirmed by Wynell Heath ([id number]) on 06/25/2023 4:54:48 PM  Radiology: [facility] Query Chest 2 View Result Date: 06/25/2023 CLINICAL DATA:  Shortness of breath and cough EXAM: CHEST - 2 VIEW COMPARISON:  04/07/2023 FINDINGS: Left-sided pacing device as before. Cardiomegaly with small bilateral pleural effusions. Mild central vascular congestion and mild diffuse interstitial process. No consolidative airspace disease. Hardware in the cervical spine. Surgical clips at the left thoracic inlet. IMPRESSION: Cardiomegaly with small bilateral pleural effusions and mild central vascular congestion. Mild diffuse interstitial process may be due to edema or infection. Electronically Signed   By: Esmeralda Hedge M.D.   On: 06/25/2023 16:03     Procedures   Medications Ordered in the ED  enoxaparin  (LOVENOX ) injection 30 mg (30 mg Subcutaneous Not Given 06/25/23 1946)  ondansetron  (ZOFRAN ) tablet 4 mg (has no administration in time range)    Or  ondansetron  (ZOFRAN ) injection 4 mg (has no administration in time range)  albuterol  (PROVENTIL ) (2.5 MG/3ML) 0.083% nebulizer solution 2.5 mg (2.5 mg Nebulization Given 06/25/23 1950)  albuterol  (PROVENTIL ) (2.5 MG/3ML) 0.083% nebulizer solution 2.5 mg ( Nebulization Canceled Entry 06/25/23 1950)  ipratropium (ATROVENT ) nebulizer solution 0.5 mg (0.5 mg Nebulization Given 06/25/23 1950)                                    Medical Decision Making Risk Decision regarding hospitalization.    Kelda JONIYA BOBERG is a 89 y.o. female with a complex past medical history including Crohn's disease, previous DVT/PE with IVC filter placed due to previous GI bleeding, CHF, COPD on 4 L home oxygen at baseline,  abdominal and thoracic aortic aneurysm, CKD, sleep apnea, previous diverticular hemorrhage, CAD with MI, previous cholecystectomy, pacemaker, and previous TIA who presents with worsened breathing and cough.  According to patient, she is finishing 1 of 2 antibiotics she has been taking for the last week for this cough that has been present for about a month.  She reports shortness of breath has been worsening and per triage note, her oxygen saturation was down into the mid 80s on her home 4 L and she was sent for higher level of care due to this hypoxia.  She thinks she also has some edema and the seem similar.  She is having some chills and fatigue and decreased oral intake.  She denies any constipation, diarrhea, or urinary changes.  She denies trauma.  She is denying any chest or abdominal pain but sometimes her coughing fits cause her to have some vomiting.  Denies any back or flank pain.  Denies headache or neck pain.  Primarily concerned about her breathing and the increasing oxygen requirements she has had.  On my exam, lungs are extremely coarse and have rales bilaterally.  She does not have significant wheezing on my exam and reports that she has been taking breathing treatments at her facility.  She reports no chest tenderness on my exam and or abdominal tenderness.  I did not hear a loud murmur.  She has intact pulses in extremities and legs are edematous.  Patient is resting in a hall bed on 6 L nasal cannula which is increased from her normal baseline.  EKG shows no STEMI.  Patient is primarily concerned about worsening pneumonia and given her presenting symptoms I am worried both about this as well as some degree of fluid overload with her breath sounds.  As she is not on blood thinners due to previous bleeding thromboembolic disease also considered although she does have an IVC filter in place reportedly.  Anticipate follow-up on the chest x-ray and labs that were ordered in triage however given  increased oxygen requirement and worsened symptoms despite home antibiotic therapy, patient may require admission for further management.  We may also need to get CT PE study based on the patient's workup.  Anticipate reassessment after workup to determine disposition.  6:16 PM Workup continues to return.  Her troponin is elevated but not as high as it in the past.  Will trend.  BNP is over 2000 but also slightly better than it has been last it was checked.  Her kidney function is worse with an AKI of 1.63 up from prior.  Otherwise her electrolyte seems similar.  CBC does not show leukocytosis and again similar mild anemia.  Lactic acid was normal and she is afebrile here.  She was negative for COVID/flu/RSV.  Chest x-ray is concerning for edema versus infection.  Given the patient's age of 14, her being on 6 L currently, still having the shortness of breath and cough, I am worried about sending her home.  I do feel the patient needs some diuresis in the setting of AKI that will need to be monitored and will speak to admitting team about if they feel she needs to continue antibiotics or not.  Also discussed them if they feel she needs CT PE study as I was worried about hurting her kidneys more as she reports does not feel like when she had a blood clot in the past.  Will call for admission for increased oxygen requirement and symptoms in the setting of AKI and fluid overload.       Final diagnoses:  Shortness of breath  Acute cough  Hypoxia  AKI (acute kidney injury) (HCC)  Elevated brain natriuretic peptide (BNP) level    Clinical Impression: 1. Shortness of breath   2. Acute cough   3. Hypoxia   4. AKI (acute kidney injury) (HCC)   5. Elevated brain natriuretic peptide (BNP) level     Disposition: Admit  This note was prepared with assistance of Dragon voice recognition software. Occasional wrong-word or sound-a-like substitutions may have occurred due to the inherent limitations  of voice recognition software.     Sanad Fearnow, Marine Sia, MD 06/25/23 2027

## 2023-06-25 NOTE — ED Notes (Signed)
 [facility] [phone] please update family friend (friend states she's currently at the hospital sine the pt's daughter lives far).

## 2023-06-25 NOTE — H&P (Signed)
 History and Physical    Patient: Felicia Benson WNU:[id number] DOB: July 31, 1930 DOA: 06/25/2023 DOS: the patient was seen and examined on 06/25/2023 PCP: Stasia Edelman  Patient coming from: SNF  Chief Complaint:  Chief Complaint  Patient presents with   Cough   Shortness of Breath   HPI: Felicia Benson is a 89 y.o. female with medical history significant of AAA, diastolic CHF, chron's disease, COPD on 4 L, history of DVT and PE with IVC filter in place, diverticular disease, GERD, prior history of GI bleed, obstructive sleep apnea, status post post maker placement who has been having significant worsening shortness of breath at the facility for about a month.  She was on antibiotics for suspected COPD and pneumonia.  Cough continues to be worse.  She is normally on 4 L of oxygen but today became more hypoxic.  When EMS arrived they increased her oxygen to 6 L but she stayed at the upper 80s oxygen saturation.  Brought to the ER where she is diagnosed with acute on chronic hypoxic respiratory failure.  Review of Systems: As mentioned in the history of present illness. All other systems reviewed and are negative. Past Medical History:  Diagnosis Date   AAA (abdominal aortic aneurysm) (HCC)    CHF (congestive heart failure) (HCC)    Crohn's disease (HCC)    Diverticulitis    Diverticulosis    GERD (gastroesophageal reflux disease)    GI bleed    Hypertension    OSA (obstructive sleep apnea)    Pacemaker    Thyroid nodule    Past Surgical History:  Procedure Laterality Date   CHOLECYSTECTOMY     COLONOSCOPY WITH PROPOFOL  N/A 06/01/2015   Procedure: COLONOSCOPY WITH PROPOFOL ;  Surgeon: Baldo Bonds, MD;  Location: [facility] ENDOSCOPY;  Service: Endoscopy;  Laterality: N/A;   CORONARY ANGIOPLASTY WITH STENT PLACEMENT     ESOPHAGOGASTRODUODENOSCOPY (EGD) WITH PROPOFOL  N/A 06/01/2015   Procedure: ESOPHAGOGASTRODUODENOSCOPY (EGD) WITH PROPOFOL ;  Surgeon: Baldo Bonds, MD;  Location: [facility]  ENDOSCOPY;  Service: Endoscopy;  Laterality: N/A;   GIVENS CAPSULE STUDY N/A 06/03/2015   Procedure: GIVENS CAPSULE STUDY;  Surgeon: Baldo Bonds, MD;  Location: [facility] ENDOSCOPY;  Service: Endoscopy;  Laterality: N/A;   INTRAMEDULLARY (IM) NAIL INTERTROCHANTERIC Right 10/30/2022   Procedure: INTRAMEDULLARY nailing of right femur;  Surgeon: Murleen Arms, MD;  Location: [facility] OR;  Service: Orthopedics;  Laterality: Right;   IR IVC FILTER PLMT / S&I /IMG GUID/MOD SED  02/11/2023   PACEMAKER PLACEMENT  2015   Social History:  reports that she has quit smoking. She has never used smokeless tobacco. She reports that she does not drink alcohol  and does not use drugs.  Allergies  Allergen Reactions   Motrin [Ibuprofen] Other (See Comments)    GI bleed   Ace Inhibitors Cough   Breztri  Aerosphere [Budeson-Glycopyrrol-Formoterol ] Hypertension   Cipro [Ciprofloxacin Hcl] Other (See Comments)    Avoid fluoroquinolones due to asc-aortic aneurysm   Levaquin [Levofloxacin] Other (See Comments)    Avoid fluoroquinolones due to asc-aortic aneurysm   Nsaids Nausea And Vomiting and Other (See Comments)    Hx of GI bleed    Family History  Problem Relation Age of Onset   CVA Mother    Lung cancer Father    Colon cancer Neg Hx     Prior to Admission medications   Medication Sig Start Date End Date Taking? Authorizing Provider  acetaminophen  (TYLENOL ) 500 MG tablet Take 1,000 mg by mouth at bedtime. Give with  melatonin    [provider]  albuterol  (VENTOLIN  HFA) 108 (90 Base) MCG/ACT inhaler Inhale 2 puffs into the lungs every 6 (six) hours as needed for wheezing or shortness of breath. 07/24/22   Justina Oman, MD  aspirin  EC 81 MG tablet Take 1 tablet (81 mg total) by mouth daily. Swallow whole. 03/29/21   Lisabeth Rider, MD  atorvastatin  (LIPITOR) 40 MG tablet Take 40 mg by mouth at bedtime.    [provider]  benzonatate  (TESSALON ) 100 MG capsule Take 100 mg by mouth every  12 (twelve) hours as needed for cough.    [provider]  cholecalciferol  (VITAMIN D3) 25 MCG (1000 UNIT) tablet Take 1,000 Units by mouth daily.    [provider]  docusate sodium  (COLACE) 100 MG capsule Take 1 capsule (100 mg total) by mouth daily. Patient taking differently: Take 200 mg by mouth daily. 01/04/23   Daren Eck, DO  ferrous sulfate  325 (65 FE) MG tablet Take 325 mg by mouth daily.    [provider]  ipratropium-albuterol  (DUONEB) 0.5-2.5 (3) MG/3ML SOLN Take 3 mLs by nebulization See admin instructions. 3mL inhale orally every 2 hours as needed for shortness of breath or wheezing for 3 days via nebulizer every 2-4 hours.    [provider]  lidocaine  (ASPERCREME LIDOCAINE ) 4 % Place 1 patch onto the skin daily as needed (knee pain).    [provider]  melatonin 3 MG TABS tablet Take 9 mg by mouth at bedtime.    [provider]  midodrine  (PROAMATINE ) 10 MG tablet Take 0.5 tablets (5 mg total) by mouth See admin instructions. Give 0.5 tablet (5mg ) by mouth three times a day for hypotension. Hold if sBP > 130. 04/10/23   Pokhrel, Laxman, MD  mometasone -formoterol  (DULERA ) 100-5 MCG/ACT AERO Inhale 2 puffs into the lungs 2 (two) times daily.    [provider]  montelukast  (SINGULAIR ) 10 MG tablet Take 1 tablet (10 mg total) by mouth daily. 08/01/21   Margaretann Sharper, MD  ondansetron  (ZOFRAN ) 4 MG tablet Take 4 mg by mouth every 6 (six) hours as needed for nausea or vomiting.    [provider]  oxyCODONE -acetaminophen  (PERCOCET/ROXICET) 5-325 MG tablet Take 1 tablet by mouth every 8 (eight) hours as needed for moderate pain (pain score 4-6) or severe pain (pain score 7-10). 04/10/23   Pokhrel, Laxman, MD  pantoprazole  (PROTONIX ) 40 MG tablet Take 40 mg by mouth in the morning and at bedtime.    [provider]  PENTASA  500 MG CR capsule Take 1,000 mg by mouth in the morning and at bedtime.     [provider]  torsemide  (DEMADEX ) 20 MG tablet Take 1 tablet (20 mg total) by mouth daily. 03/18/23   Lesa Rape, MD    Physical Exam: Vitals:   06/25/23 1433 06/25/23 1700 06/25/23 1820  BP: 127/85 126/80   Pulse: 95 100   Resp: 18 19   Temp: (!) 97.4 F (36.3 C)  98.6 F (37 C)  TempSrc:   Oral  SpO2: 93% 91%    Constitutional: Obese, chronically ill looking, NAD, calm, comfortable Eyes: PERRL, lids and conjunctivae normal ENMT: Mucous membranes are moist. Posterior pharynx clear of any exudate or lesions.Normal dentition.  Neck: normal, supple, no masses, no thyromegaly Respiratory: Decreased air entry with coarse breath sounds bilaterally, no wheezing, no crackles. Normal respiratory effort. No accessory muscle use.  Cardiovascular: Sinus tachycardia, no murmurs / rubs / gallops.  1+ extremity edema. 2+ pedal pulses. No carotid bruits.  Abdomen: no tenderness, no masses palpated. No hepatosplenomegaly. Bowel sounds positive.  Musculoskeletal: Good range of motion, no joint swelling or tenderness, Skin: no rashes, lesions, ulcers. No induration Neurologic: CN 2-12 grossly intact. Sensation intact, DTR normal. Strength 5/5 in all 4.  Psychiatric: Normal judgment and insight. Alert and oriented x 3. Normal mood  Data Reviewed:  20774, respiratory 21, oxygen sat 91% on 6 L.  Chloride 92, BUN 44 creatinine 1.63 and calcium  8.1 BL.  2051, hemoglobin 11.1 glucose 99, acute respiratory panel negative chest x-ray showed cardiomegaly with small bilateral pleural effusion and mild central vascular congestion mild diffuse interstitial process may be due to edema or infection  Assessment and Plan:  #1 acute on chronic hypoxic respiratory failure: More than likely acute on chronic diastolic heart failure.  Patient will be admitted to cardiac floor.  Initiate diuresis.  Continue other cardiac medications.  Titrate oxygen down.  #2 COPD: With acute exacerbation.  No steroids.   Initiate breathing treatment.  #3 acute on chronic diastolic heart failure: Continue with diuresis.  Continue to monitor.  #4 essential hypertension: Continue with blood pressure medications.  #5 coronary artery disease: Stable.  No coronary artery decompensation at the moment.  Continue home regimen  #6 hyperlipidemia: Continue with statin  #7 history of chron's disease: In remission.  Continue home regimen    Advance Care Planning:   Code Status: Prior DNR  Consults: None  Family Communication: No family at bedside  Severity of Illness: The appropriate patient status for this patient is INPATIENT. Inpatient status is judged to be reasonable and necessary in order to provide the required intensity of service to ensure the patient's safety. The patient's presenting symptoms, physical exam findings, and initial radiographic and laboratory data in the context of their chronic comorbidities is felt to place them at high risk for further clinical deterioration. Furthermore, it is not anticipated that the patient will be medically stable for discharge from the hospital within 2 midnights of admission.   * I certify that at the point of admission it is my clinical judgment that the patient will require inpatient hospital care spanning beyond 2 midnights from the point of admission due to high intensity of service, high risk for further deterioration and high frequency of surveillance required.*  AuthorCarolin Chyle, MD 06/25/2023 6:52 PM  For on call review [email or web address].

## 2023-06-26 DIAGNOSIS — J9621 Acute and chronic respiratory failure with hypoxia: Secondary | ICD-10-CM | POA: Diagnosis not present

## 2023-06-26 LAB — COMPREHENSIVE METABOLIC PANEL WITH GFR
ALT: 16 U/L (ref 0–44)
AST: 23 U/L (ref 15–41)
Albumin: 2.2 g/dL — ABNORMAL LOW (ref 3.5–5.0)
Alkaline Phosphatase: 75 U/L (ref 38–126)
Anion gap: 11 (ref 5–15)
BUN: 43 mg/dL — ABNORMAL HIGH (ref 8–23)
CO2: 31 mmol/L (ref 22–32)
Calcium: 8.1 mg/dL — ABNORMAL LOW (ref 8.9–10.3)
Chloride: 94 mmol/L — ABNORMAL LOW (ref 98–111)
Creatinine, Ser: 1.46 mg/dL — ABNORMAL HIGH (ref 0.44–1.00)
GFR, Estimated: 34 mL/min — ABNORMAL LOW (ref >60)
Glucose, Bld: 126 mg/dL — ABNORMAL HIGH (ref 70–99)
Potassium: 4 mmol/L (ref 3.5–5.1)
Sodium: 136 mmol/L (ref 135–145)
Total Bilirubin: 0.8 mg/dL (ref 0.0–1.2)
Total Protein: 6.1 g/dL — ABNORMAL LOW (ref 6.5–8.1)

## 2023-06-26 LAB — CBC
HCT: 35.8 % — ABNORMAL LOW (ref 36.0–46.0)
Hemoglobin: 10.9 g/dL — ABNORMAL LOW (ref 12.0–15.0)
MCH: 29.5 pg (ref 26.0–34.0)
MCHC: 30.4 g/dL (ref 30.0–36.0)
MCV: 97 fL (ref 80.0–100.0)
Platelets: 211 10*3/uL (ref 150–400)
RBC: 3.69 MIL/uL — ABNORMAL LOW (ref 3.87–5.11)
RDW: 17.5 % — ABNORMAL HIGH (ref 11.5–15.5)
WBC: 6.1 10*3/uL (ref 4.0–10.5)
nRBC: 0 % (ref 0.0–0.2)

## 2023-06-26 MED ORDER — IPRATROPIUM-ALBUTEROL 0.5-2.5 (3) MG/3ML IN SOLN
3.0000 mL | Freq: Three times a day (TID) | RESPIRATORY_TRACT | Status: DC
  Administered 2023-06-26 – 2023-06-27 (×2): 3 mL via RESPIRATORY_TRACT
  Filled 2023-06-26 (×2): qty 3

## 2023-06-26 MED ORDER — SACCHAROMYCES BOULARDII 250 MG PO CAPS
250.0000 mg | ORAL_CAPSULE | Freq: Two times a day (BID) | ORAL | Status: DC
  Administered 2023-06-26 – 2023-06-30 (×9): 250 mg via ORAL
  Filled 2023-06-26 (×10): qty 1

## 2023-06-26 MED ORDER — PANTOPRAZOLE SODIUM 40 MG PO TBEC
40.0000 mg | DELAYED_RELEASE_TABLET | Freq: Every day | ORAL | Status: DC
  Administered 2023-06-26 – 2023-07-01 (×6): 40 mg via ORAL
  Filled 2023-06-26 (×6): qty 1

## 2023-06-26 MED ORDER — FUROSEMIDE 10 MG/ML IJ SOLN: 80.0000 mg | Freq: Two times a day (BID) | INTRAMUSCULAR | Status: DC

## 2023-06-26 MED ORDER — FLUTICASONE FUROATE-VILANTEROL 200-25 MCG/ACT IN AEPB
1.0000 | INHALATION_SPRAY | Freq: Every day | RESPIRATORY_TRACT | Status: DC
  Administered 2023-06-28 – 2023-07-01 (×4): 1 via RESPIRATORY_TRACT
  Filled 2023-06-26: qty 28

## 2023-06-26 MED ORDER — MONTELUKAST SODIUM 10 MG PO TABS
10.0000 mg | ORAL_TABLET | Freq: Every day | ORAL | Status: DC
  Administered 2023-06-26 – 2023-07-01 (×6): 10 mg via ORAL
  Filled 2023-06-26 (×6): qty 1

## 2023-06-26 MED ORDER — BACID PO TABS: 2.0000 | ORAL_TABLET | Freq: Three times a day (TID) | ORAL | Status: DC

## 2023-06-26 MED ORDER — RISAQUAD PO CAPS
2.0000 | ORAL_CAPSULE | Freq: Every day | ORAL | Status: DC
  Administered 2023-06-26 – 2023-07-01 (×6): 2 via ORAL
  Filled 2023-06-26 (×6): qty 2

## 2023-06-26 MED ORDER — ASPIRIN 81 MG PO TBEC
81.0000 mg | DELAYED_RELEASE_TABLET | Freq: Every day | ORAL | Status: DC
  Administered 2023-06-26 – 2023-07-01 (×6): 81 mg via ORAL
  Filled 2023-06-26 (×6): qty 1

## 2023-06-26 MED ORDER — MEDIHONEY WOUND/BURN DRESSING EX PSTE
1.0000 | PASTE | Freq: Every day | CUTANEOUS | Status: DC
  Administered 2023-06-26 – 2023-07-01 (×6): 1 via TOPICAL
  Filled 2023-06-26: qty 44

## 2023-06-26 MED ORDER — AZITHROMYCIN 250 MG PO TABS
500.0000 mg | ORAL_TABLET | Freq: Every day | ORAL | Status: AC
  Administered 2023-06-26 – 2023-06-28 (×3): 500 mg via ORAL
  Filled 2023-06-26 (×3): qty 2

## 2023-06-26 MED ORDER — ZINC OXIDE 40 % EX OINT
TOPICAL_OINTMENT | Freq: Two times a day (BID) | CUTANEOUS | Status: DC
  Filled 2023-06-26: qty 57

## 2023-06-26 MED ORDER — MELATONIN 3 MG PO TABS
9.0000 mg | ORAL_TABLET | Freq: Every day | ORAL | Status: DC
  Administered 2023-06-26 – 2023-06-30 (×5): 9 mg via ORAL
  Filled 2023-06-26 (×5): qty 3

## 2023-06-26 MED ORDER — FUROSEMIDE 10 MG/ML IJ SOLN
80.0000 mg | Freq: Every day | INTRAMUSCULAR | Status: DC
  Administered 2023-06-26 – 2023-06-27 (×2): 80 mg via INTRAVENOUS
  Filled 2023-06-26 (×2): qty 8

## 2023-06-26 MED ORDER — PREDNISONE 20 MG PO TABS
60.0000 mg | ORAL_TABLET | Freq: Every day | ORAL | Status: DC
  Administered 2023-06-26 – 2023-06-28 (×3): 60 mg via ORAL
  Filled 2023-06-26 (×3): qty 3

## 2023-06-26 MED ORDER — FLUTICASONE FUROATE-VILANTEROL 200-25 MCG/ACT IN AEPB: 1.0000 | INHALATION_SPRAY | Freq: Every day | RESPIRATORY_TRACT | Status: DC

## 2023-06-26 MED ORDER — MIDODRINE HCL 5 MG PO TABS
5.0000 mg | ORAL_TABLET | Freq: Three times a day (TID) | ORAL | Status: DC
  Administered 2023-06-26 – 2023-06-30 (×13): 5 mg via ORAL
  Filled 2023-06-26 (×13): qty 1

## 2023-06-26 MED ORDER — MESALAMINE ER 250 MG PO CPCR
1000.0000 mg | ORAL_CAPSULE | Freq: Two times a day (BID) | ORAL | Status: DC
  Administered 2023-06-26 – 2023-07-01 (×11): 1000 mg via ORAL
  Filled 2023-06-26 (×13): qty 4

## 2023-06-26 MED ORDER — IPRATROPIUM-ALBUTEROL 0.5-2.5 (3) MG/3ML IN SOLN
3.0000 mL | Freq: Three times a day (TID) | RESPIRATORY_TRACT | Status: DC
  Administered 2023-06-26: 3 mL via RESPIRATORY_TRACT
  Filled 2023-06-26: qty 3

## 2023-06-26 MED ORDER — POLYETHYLENE GLYCOL 3350 17 G PO PACK: 17.0000 g | PACK | Freq: Two times a day (BID) | ORAL | Status: DC | PRN

## 2023-06-26 NOTE — ED Notes (Signed)
Patient is resting comfortably in NAD at this time.

## 2023-06-26 NOTE — ED Notes (Signed)
 Got patient some ice water patient is resting with call bell in reach and family at bedside

## 2023-06-26 NOTE — Consult Note (Addendum)
 WOC Nurse Consult Note: patient is familiar to WOC team from previous admission with Stage 3 Pressure Injury coccyx  Reason for Consult: Stage 3 PI present on admission  Wound type: Stage 3 pressure injury sacrum/coccyx  Pressure Injury POA: Yes Measurement: see nursing flowsheet  Wound bed: appears 50% yellow 50% red  Drainage (amount, consistency, odor) see nursing flowsheet  Periwound: intact, mild erythema  Dressing procedure/placement/frequency:  Cleanse sacral/coccyx PI with NS, apply Medihoney to wound bed daily and fill in wound with dry gauze.  Secure with silicone foam or ABD pad whichever is preferred.   POC discussed with bedside nurse. WOC team will not follow. Re-consult if further needs arise.   Thank you,    Ronni Colace MSN, RN-BC, [employer]

## 2023-06-26 NOTE — Progress Notes (Addendum)
 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  RV overload small pericardial effusion--has history of low output HF-she is on midodrine  CAD PCI with stenting to RCA 2017, sick sinus syndrome with prior heart block PPM generator exchange 07/31/2022 COPD with chronic respiratory failure about 4 L at baseline at home PE, LLE DVT with IVC filter placed during 02/07/2023-02/19/2023 admission Prior?  Diverticular bleed on admission 12/28/2022 and taken off of anticoagulation-CT angiogram did not show acute bleed but showed severe sigmoid diverticulosis and received 3 units PRBC-also known history of Crohn's disease on Pentasa  Prior right intertrochanteric femoral fracture secondary to fall 10/28/2022 complicated by postop ileus at that admission with lengthy stay TIA 12/2020 Known small pancreatic lesion on CT study 07/2022 with thoracic aortic aneurysm  Coughing X 1 month with history of COPD and not improved over the past several weeks brought [facility] ED placed on 6 L instead of 4 L Sodium 136 potassium 4.6 BUN/creatinine 44/1.6 LFTs normal bilirubin normal BNP 2051--Troponin 41--- trend flat WBC 6.4 hemoglobin 9.1 platelet 259 lactic acid 1.0 CXR cardiomegaly small bilateral pleural effusions mild central vascular congestion?  Mild diffuse interstitial process secondary to edema  Plan  Multifactorial respiratory failure as below  Acute respiratory failure superimposed on COPD with chronic respiratory failure 4 L at home Recent treatment of pneumonia has underlying COPD might contribute---main treatment-Rx  CHF as she has been recently almost completed a course of  antibiotics Has wheeze, start some steroids prednisone  60 and see if this makes a difference --narrow to azithromycin  for 3 days for COPD exacerbation Resume DuoNeb 3 mL every 2 as needed, substitute for Dulera  Breo Ellipta  1 puff daily, Singulair  10 daily Can resume Robitussin 10 mL 3 times daily--hold Tessalon  and Delsym  for now  Acute superimposed on HFpEF last echo 55-60%//severe pulmonary hypertension with history of low output HF BNP 2051 with flat troponin-previous trends were much lower 4 months ago and she has trended in the 2000 range recently diuresis with Lasix  80  daily--- Home meds include Demadex  20 twice daily Strict I/O Weights are inaccurate she may be losing weight at home-on last admission discharge weight was around 84 kg here she is 81 Watch creatinine carefully Will also in addition need midodrine  support 5 mg 3 times daily  Recent treatment for pneumonia Appears to have completed or nearly completed therapy so we will hold doxycycline  and cefdinir from PTA Had been treated 6 weeks ago with 1 course of antibiotics and then given another course recently  Underlying CAD, sick sinus syndrome not on anticoagulation secondary to diverticular bleed  Diuresis as above  PE, LLE DVT not on anticoagulation secondary to GI bleed Has IVC filter Is on daily aspirin  only would continue the same  Previous right hip fracture 10/2022 Tylenol  as per prior home regimen  TIA 12/2020 Aspirin  81 daily, continue Crestor 40 at bedtime at discharge  Pancreatic lesion on CT 07/2022 with thoracic aortic aneurysm  Crohn's disease Continue Pentasa  1000 a.m. and p.m. May use MiraLAX  as needed  No family present at bedside patient awake coherent will update when able  DVT prophylaxis: Lovenox   Status is: Inpatient Remains inpatient appropriate because:  Requires inpatient monitoring  Subjective: Coherent awake alert coughing some wheezing Tells me he still has a deep cough does  not feel significantly changed No chest pain No fever chills Has eaten some  Objective + exam Vitals:   06/26/23 0230 06/26/23 0309 06/26/23 0400 06/26/23 0600  BP: 110/68  128/82 130/78  Pulse: 99  95 89  Resp: (!) 21  20 17   Temp:  97.6 F (36.4 C)    TempSrc:  Oral    SpO2: 97%  91% 98%   There were no vitals filed for this visit.  Examination: EOMI NCAT no focal deficit this expiratory wheezes bilaterally posterior laterally--is on oxygen S1-S2 no murmur no rub no gallop ROM is intact moving 4 limbs equally without gross focal deficit Power is 5/5 no focal deficit she does have some chronic left lower extremity edema She does have some JVD and hepatojugular reflux is positive Moderate dentition  Data Reviewed: reviewed   CBC    Component Value Date/Time   WBC 6.1 06/26/2023 0500   RBC 3.69 (L) 06/26/2023 0500   HGB 10.9 (L) 06/26/2023 0500   HCT 35.8 (L) 06/26/2023 0500   PLT 211 06/26/2023 0500   MCV 97.0 06/26/2023 0500   MCH 29.5 06/26/2023 0500   MCHC 30.4 06/26/2023 0500   RDW 17.5 (H) 06/26/2023 0500   LYMPHSABS 0.5 (L) 04/07/2023 1711   MONOABS 0.8 04/07/2023 1711   EOSABS 0.0 04/07/2023 1711   BASOSABS 0.0 04/07/2023 1711      Latest Ref Rng & Units 06/25/2023    2:50 PM 04/10/2023    2:54 AM 04/09/2023    3:48 AM  CMP  Glucose 70 - 99 mg/dL 99  161  99   BUN 8 - 23 mg/dL 44  41  46   Creatinine 0.44 - 1.00 mg/dL 0.96  0.45  4.09   Sodium 135 - 145 mmol/L 136  136  138   Potassium 3.5 - 5.1 mmol/L 4.6  4.3  4.9   Chloride 98 - 111 mmol/L 92  94  95   CO2 22 - 32 mmol/L 32  32  31   Calcium  8.9 - 10.3 mg/dL 8.1  8.2  8.3   Total Protein 6.5 - 8.1 g/dL 6.4     Total Bilirubin 0.0 - 1.2 mg/dL 0.6     Alkaline Phos 38 - 126 U/L 71     AST 15 - 41 U/L 26     ALT 0 - 44 U/L 17       Scheduled Meds:  albuterol   2.5 mg Nebulization Q6H   enoxaparin  (LOVENOX ) injection  30 mg Subcutaneous Q24H   ipratropium  0.5 mg Nebulization Q6H   Continuous  Infusions:  Time  55  Jai-Gurmukh Corvette Orser, MD  [facility]

## 2023-06-26 NOTE — ED Notes (Signed)
 Sit patient up in the bed to eat lunch patient has call bell in reach

## 2023-06-26 NOTE — ED Notes (Signed)
 Put cushion under her hip and helped pulled pt up

## 2023-06-27 DIAGNOSIS — J9621 Acute and chronic respiratory failure with hypoxia: Secondary | ICD-10-CM | POA: Diagnosis not present

## 2023-06-27 MED ORDER — METOLAZONE 5 MG PO TABS
2.5000 mg | ORAL_TABLET | Freq: Once | ORAL | Status: AC
  Administered 2023-06-27: 2.5 mg via ORAL
  Filled 2023-06-27: qty 1

## 2023-06-27 MED ORDER — TORSEMIDE 20 MG PO TABS
40.0000 mg | ORAL_TABLET | Freq: Every day | ORAL | Status: DC
  Administered 2023-06-27 – 2023-06-30 (×4): 40 mg via ORAL
  Filled 2023-06-27 (×4): qty 2

## 2023-06-27 NOTE — Plan of Care (Signed)
  Problem: Clinical Measurements: Goal: Ability to maintain clinical measurements within normal limits will improve Outcome: Progressing Goal: Diagnostic test results will improve Outcome: Progressing Goal: Respiratory complications will improve Outcome: Progressing   Problem: Activity: Goal: Risk for activity intolerance will decrease Outcome: Progressing   

## 2023-06-27 NOTE — Progress Notes (Signed)
 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  RV overload small pericardial effusion--has history of low output HF-she is on midodrine  CAD PCI with stenting to RCA 2017, sick sinus syndrome with prior heart block PPM generator exchange 07/31/2022 COPD with chronic respiratory failure about 4 L at baseline at home PE, LLE DVT with IVC filter placed during 02/07/2023-02/19/2023 admission Prior?  Diverticular bleed on admission 12/28/2022 and taken off of anticoagulation-CT angiogram did not show acute bleed but showed severe sigmoid diverticulosis and received 3 units PRBC-also known history of Crohn's disease on Pentasa  Prior right intertrochanteric femoral fracture secondary to fall 10/28/2022 complicated by postop ileus at that admission with lengthy stay TIA 12/2020 Known small pancreatic lesion on CT study 07/2022 with thoracic aortic aneurysm  Coughing X 1 month with history of COPD and not improved over the past several weeks brought [facility] ED placed on 6 L instead of 4 L Sodium 136 potassium 4.6 BUN/creatinine 44/1.6 LFTs normal bilirubin normal BNP 2051--Troponin 41--- trend flat WBC 6.4 hemoglobin 9.1 platelet 259 lactic acid 1.0 CXR cardiomegaly small bilateral pleural effusions mild central vascular congestion?  Mild diffuse interstitial process secondary to edema  Plan  Multifactorial respiratory failure as below  Acute respiratory failure superimposed on COPD with chronic respiratory failure 4 L at home Recent treatment of pneumonia no overt PNA now -main treatment-Rx /COPD/CHF as below Has wheeze, now prednisone  60 -narrow to azithromycin  for 3  days for COPD exacerbation Continue DuoNeb 3 mL every 2 as needed, substitute for Dulera  Breo Ellipta  1 puff daily, Singulair  10 daily Can resume Robitussin 10 mL 3 times daily--hold Tessalon  and Delsym  for now  Acute superimposed on HFpEF last echo 55-60%//severe pulmonary hypertension with history of low output HF BNP 2051 with flat troponin- Change IV Lasix  to Demadex  40 daily-add Zaroxolyn 2.5 x 1 and see output Wght and I/o inaccurate--std wght, strict I/o as best able and progressive mobility Continues midodrine  support 5 mg 3 times daily  Recent treatment for pneumonia hold doxycycline  and cefdinir from PTA as less likely PNA given recent Rx 6 weeks ago 1 course of antibiotics and then given another course recently  Underlying CAD, sick sinus syndrome not on anticoagulation secondary to diverticular bleed  Diuresis as above  PE, LLE DVT not on anticoagulation secondary to GI bleed Has IVC filter Is on daily aspirin  only would continue the same  Previous right hip fracture 10/2022 Tylenol  as per prior home regimen  TIA 12/2020 Aspirin  81 daily, continue Crestor 40 at bedtime at discharge  Pancreatic lesion on CT 07/2022 with thoracic aortic aneurysm Please will need outpatient characterization and discussion with her PCP-to be addressed and discussed as an outpatient  Crohn's disease Continue Pentasa  1000 a.m. and p.m. May use MiraLAX  as needed  Updated patient alone  DVT prophylaxis: Lovenox   Status is: Inpatient Remains inpatient appropriate because:   Requires inpatient monitoring and diuresis  Subjective:  Coherent awake feels better breathing is better States that PureWick malfunction and urine spilled in bed so was an accurate reading No chest pain --cough is improved with more expectoration  Objective + exam Vitals:   06/27/23 0349 06/27/23  1610 06/27/23 0808 06/27/23 1149  BP: 108/67     Pulse: 85     Resp: 16     Temp: 97.7 F (36.5 C) 97.7 F  (36.5 C)  97.8 F (36.6 C)  TempSrc: Oral Oral  Oral  SpO2: 96%  94%    There were no vitals filed for this visit.  Examination:  EOMI NCAT Thick neck Mallampati 3, some JVD noted positive hepatojugular reflex S1-S2 no murmur she has a paced rhythm on monitors without any alarms Abdomen is soft no rebound She has ankle lower extremity pitting edema Coherent pleasant Power is 5/5  Data Reviewed: reviewed   CBC    Component Value Date/Time   WBC 6.1 06/26/2023 0500   RBC 3.69 (L) 06/26/2023 0500   HGB 10.9 (L) 06/26/2023 0500   HCT 35.8 (L) 06/26/2023 0500   PLT 211 06/26/2023 0500   MCV 97.0 06/26/2023 0500   MCH 29.5 06/26/2023 0500   MCHC 30.4 06/26/2023 0500   RDW 17.5 (H) 06/26/2023 0500   LYMPHSABS 0.5 (L) 04/07/2023 1711   MONOABS 0.8 04/07/2023 1711   EOSABS 0.0 04/07/2023 1711   BASOSABS 0.0 04/07/2023 1711      Latest Ref Rng & Units 06/26/2023    8:46 PM 06/25/2023    2:50 PM 04/10/2023    2:54 AM  CMP  Glucose 70 - 99 mg/dL 960  99  454   BUN 8 - 23 mg/dL 43  44  41   Creatinine 0.44 - 1.00 mg/dL 0.98  1.19  1.47   Sodium 135 - 145 mmol/L 136  136  136   Potassium 3.5 - 5.1 mmol/L 4.0  4.6  4.3   Chloride 98 - 111 mmol/L 94  92  94   CO2 22 - 32 mmol/L 31  32  32   Calcium  8.9 - 10.3 mg/dL 8.1  8.1  8.2   Total Protein 6.5 - 8.1 g/dL 6.1  6.4    Total Bilirubin 0.0 - 1.2 mg/dL 0.8  0.6    Alkaline Phos 38 - 126 U/L 75  71    AST 15 - 41 U/L 23  26    ALT 0 - 44 U/L 16  17      Scheduled Meds:  acidophilus  2 capsule Oral Daily   aspirin  EC  81 mg Oral Daily   azithromycin   500 mg Oral Daily   enoxaparin  (LOVENOX ) injection  30 mg Subcutaneous Q24H   fluticasone  furoate-vilanterol  1 puff Inhalation Daily   furosemide   80 mg Intravenous Daily   leptospermum manuka honey  1 Application Topical Daily   liver oil-zinc oxide   Topical BID   melatonin  9 mg Oral QHS   mesalamine   1,000 mg Oral BID   midodrine   5 mg Oral TID   montelukast   10 mg  Oral Daily   pantoprazole   40 mg Oral Daily   predniSONE   60 mg Oral QAC breakfast   saccharomyces boulardii  250 mg Oral BID   Continuous Infusions:  Time  55  Jai-Gurmukh Keri Tavella, MD  [facility]

## 2023-06-27 NOTE — TOC Initial Note (Signed)
 Transition of Care [facility]) - Initial/Assessment Note    Patient Details  Name: Felicia Benson MRN: [id number] Date of Birth: 05-22-1930  Transition of Care [facility]) CM/SW Contact:    Carmon Christen, LCSWA Phone Number: 06/27/2023, 4:48 PM  Clinical Narrative:                    CSW spoke with patient at bedside. Patient reports she is from [facility] and is waiting to be transferred to LTC. Patient reports her plan is to return to [facility] when medically ready for dc. Admissions at [facility] gone for the day. CSW to follow up with [facility] to confirm patients return when patient medically ready.     Patient Goals and CMS Choice            Expected Discharge Plan and Services                                              Prior Living Arrangements/Services                       Activities of Daily Living      Permission Sought/Granted                  Emotional Assessment              Admission diagnosis:  Shortness of breath [R06.02] Hypoxia [R09.02] Elevated brain natriuretic peptide (BNP) level [R79.89] AKI (acute kidney injury) (HCC) [N17.9] Acute on chronic respiratory failure with hypoxemia (HCC) [J96.21] Acute cough [R05.1] Patient Active Problem List   Diagnosis Date Noted   Acute on chronic respiratory failure with hypoxemia (HCC) 06/25/2023   Myocardial injury 04/08/2023   Sacral ulcer (HCC) 04/08/2023   Hypokalemia 04/08/2023   Goals of care, counseling/discussion 04/08/2023   AKI (acute kidney injury) (HCC) 03/13/2023   Acute on chronic respiratory failure with hypoxia (HCC) 03/08/2023   Elevated troponin 03/08/2023   Acute exacerbation of CHF (congestive heart failure) (HCC) 03/07/2023   Right ventricular failure (HCC) 02/15/2023   Shortness of breath 02/13/2023   DVT (deep venous thrombosis) (HCC) 02/11/2023   Acute pulmonary embolism (HCC) 02/08/2023   Acute respiratory failure with hypoxia (HCC) 02/08/2023    CAP (community acquired pneumonia) 02/07/2023   Filling defect on imaging study 02/07/2023   CKD (chronic kidney disease) 02/07/2023   Pneumonia 02/07/2023   Diverticular hemorrhage 12/28/2022   Ileus, postoperative (HCC) 11/04/2022   Closed right hip fracture (HCC) 10/28/2022   Fall at home, initial encounter 10/28/2022   History of COPD 10/28/2022   Allergic rhinitis 10/28/2022   Anemia of chronic disease 10/28/2022   Chronic rhinitis 08/06/2022   OSA (obstructive sleep apnea) 08/06/2022   Heart failure, acute diastolic (HCC) 07/21/2022   CKD stage 3a, GFR 45-59 ml/min (HCC) 07/21/2022   Thoracic aortic aneurysm (HCC) 07/21/2022   GERD (gastroesophageal reflux disease) 07/21/2022   Pancreatic lesion 07/21/2022   Macular degeneration 07/21/2022   Hypocalcemia 07/21/2022   Insomnia 07/21/2022   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   TIA (transient ischemic attack) 12/31/2020   Acute on chronic diastolic heart failure (HCC) 05/19/2019   Acute CHF (congestive heart failure) (HCC) 05/18/2019   Chronic hypoxic respiratory failure (HCC) 05/18/2019   COPD (chronic obstructive pulmonary disease) (HCC) 05/18/2019   History of cardiac pacemaker 05/18/2019   AAA (abdominal  aortic aneurysm) 05/18/2019   Hyperlipidemia 05/18/2019   Abdominal pain in female 05/30/2015   Essential hypertension 05/30/2015   Crohn's disease (HCC) 05/30/2015   Gastrointestinal hemorrhage with melena 05/30/2015   Blood loss anemia 05/30/2015   Hyponatremia 05/30/2015   Kidney disease 05/30/2015   Normocytic anemia 05/30/2015   CAD S/P percutaneous coronary angioplasty 05/30/2015   Acute blood loss anemia 05/30/2015   Heme + stool    PCP:  Patient, No Pcp Per Pharmacy:   [facility] [address] [place] - [address] [place] [address] Phone: [phone] Fax: [phone]  [facility] - [place], [place] - [address] [place] [address] Phone: [phone] Fax: [phone]     Social Drivers of Health (SDOH) Social History: SDOH Screenings   Food Insecurity: No Food Insecurity (06/26/2023)  Housing: Low Risk  (06/26/2023)  Transportation Needs: No Transportation Needs (06/26/2023)  Utilities: Not At Risk (06/26/2023)  Social Connections: Moderately Isolated (06/26/2023)  Tobacco Use: Medium Risk (06/25/2023)   SDOH Interventions:     Readmission Risk Interventions    07/24/2022    2:49 PM  Readmission Risk Prevention Plan  Transportation Screening Complete  PCP or Specialist Appt within 5-7 Days Complete  Home Care Screening Complete  Medication Review (RN CM) Complete

## 2023-06-28 DIAGNOSIS — J9621 Acute and chronic respiratory failure with hypoxia: Secondary | ICD-10-CM | POA: Diagnosis not present

## 2023-06-28 LAB — BASIC METABOLIC PANEL WITH GFR
Anion gap: 11 (ref 5–15)
BUN: 50 mg/dL — ABNORMAL HIGH (ref 8–23)
CO2: 36 mmol/L — ABNORMAL HIGH (ref 22–32)
Calcium: 8.4 mg/dL — ABNORMAL LOW (ref 8.9–10.3)
Chloride: 90 mmol/L — ABNORMAL LOW (ref 98–111)
Creatinine, Ser: 1.51 mg/dL — ABNORMAL HIGH (ref 0.44–1.00)
GFR, Estimated: 32 mL/min — ABNORMAL LOW (ref >60)
Glucose, Bld: 129 mg/dL — ABNORMAL HIGH (ref 70–99)
Potassium: 4.3 mmol/L (ref 3.5–5.1)
Sodium: 137 mmol/L (ref 135–145)

## 2023-06-28 MED ORDER — PREDNISONE 20 MG PO TABS
20.0000 mg | ORAL_TABLET | Freq: Every day | ORAL | Status: DC
  Filled 2023-06-28: qty 1

## 2023-06-28 MED ORDER — ACETAMINOPHEN 500 MG PO TABS
1000.0000 mg | ORAL_TABLET | Freq: Three times a day (TID) | ORAL | Status: DC | PRN
  Administered 2023-06-28 – 2023-06-29 (×2): 1000 mg via ORAL
  Filled 2023-06-28 (×2): qty 2

## 2023-06-28 NOTE — TOC Progression Note (Signed)
 Transition of Care [facility]) - Progression Note    Patient Details  Name: Felicia Benson MRN: [id number] Date of Birth: Jul 06, 1930  Transition [facility]) CM/SW Contact  Valery Gaucher, [place] Phone Number: 06/28/2023, 5:02 PM  Clinical Narrative:     Attempted to contact [facility], left voice message to return call.   Liddie Reel, MSW, LCSW Clinical Social Worker    Expected Discharge Plan: Skilled Nursing Facility Barriers to Discharge: Continued Medical Work up  Expected Discharge Plan and Services In-house Referral: Clinical Social Work     Living arrangements for the past 2 months: Skilled Nursing Facility                                       Social Determinants of Health (SDOH) Interventions SDOH Screenings   Food Insecurity: No Food Insecurity (06/26/2023)  Housing: Low Risk  (06/26/2023)  Transportation Needs: No Transportation Needs (06/26/2023)  Utilities: Not At Risk (06/26/2023)  Social Connections: Moderately Isolated (06/26/2023)  Tobacco Use: Medium Risk (06/25/2023)    Readmission Risk Interventions    07/24/2022    2:49 PM  Readmission Risk Prevention Plan  Transportation Screening Complete  PCP or Specialist Appt within 5-7 Days Complete  Home Care Screening Complete  Medication Review (RN CM) Complete

## 2023-06-28 NOTE — TOC Progression Note (Signed)
 Transition of Care [facility]) - Progression Note    Patient Details  Name: Felicia Benson MRN: [id number] Date of Birth: Apr 17, 1930  Transition [facility] Care [facility]) CM/SW Contact  Valery Gaucher, [place] Phone Number: 06/28/2023, 2:03 PM  Clinical Narrative:     Sent message to Stasia Edelman to informed per MD anticipated d/c tomorrow- waiting on response.   Liddie Reel, MSW, LCSW Clinical Social Worker    Expected Discharge Plan: Skilled Nursing Facility Barriers to Discharge: Continued Medical Work up  Expected Discharge Plan and Services In-house Referral: Clinical Social Work     Living arrangements for the past 2 months: Skilled Nursing Facility                                       Social Determinants of Health (SDOH) Interventions SDOH Screenings   Food Insecurity: No Food Insecurity (06/26/2023)  Housing: Low Risk  (06/26/2023)  Transportation Needs: No Transportation Needs (06/26/2023)  Utilities: Not At Risk (06/26/2023)  Social Connections: Moderately Isolated (06/26/2023)  Tobacco Use: Medium Risk (06/25/2023)    Readmission Risk Interventions    07/24/2022    2:49 PM  Readmission Risk Prevention Plan  Transportation Screening Complete  PCP or Specialist Appt within 5-7 Days Complete  Home Care Screening Complete  Medication Review (RN CM) Complete

## 2023-06-28 NOTE — Progress Notes (Signed)
 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  RV overload small pericardial effusion--has history of low output HF-she is on midodrine  CAD PCI with stenting to RCA 2017, sick sinus syndrome with prior heart block PPM generator exchange 07/31/2022 COPD with chronic respiratory failure about 4 L at baseline at home PE, LLE DVT with IVC filter placed during 02/07/2023-02/19/2023 admission Prior?  Diverticular bleed on admission 12/28/2022 and taken off of anticoagulation-CT angiogram did not show acute bleed but showed severe sigmoid diverticulosis and received 3 units PRBC-also known history of Crohn's disease on Pentasa  Prior right intertrochanteric femoral fracture secondary to fall 10/28/2022 complicated by postop ileus at that admission with lengthy stay TIA 12/2020 Known small pancreatic lesion on CT study 07/2022 with thoracic aortic aneurysm  Coughing X 1 month with history of COPD and not improved over the past several weeks brought [facility] ED placed on 6 L instead of 4 L Sodium 136 potassium 4.6 BUN/creatinine 44/1.6 LFTs normal bilirubin normal BNP 2051--Troponin 41--- trend flat WBC 6.4 hemoglobin 9.1 platelet 259 lactic acid 1.0 CXR cardiomegaly small bilateral pleural effusions mild central vascular congestion?  Mild diffuse interstitial process secondary to edema  Diuresing and treating both heart failure and mild COPD and is improving slowly  Plan  Multifactorial respiratory failure as below  Acute respiratory failure superimposed on COPD with chronic respiratory failure 4 L at home Recent treatment of pneumonia no overt PNA now -main treatment-Rx  /COPD/CHF as below Prednisone  60---> 20 mg and d/c 6/23, completed azithromycin  6/20 Continue albuterol  2.5 every 2-Breo Ellipta  1 puff daily, Singulair  10 daily Robitussin 10 mL 3 times daily--hold Tessalon  and Delsym    Acute superimposed on HFpEF last echo 55-60%//severe pulmonary hypertension with history of low output HF BNP 2051 with flat troponin- output-is -5.2 L weight is 84 Continues midodrine  support 5 mg 3 times daily Nearing euvolemia hold diuretics today and start back Demadex  in a.m. and if maintains negative fluid status likely will be ready for discharge  Near fall on 6/19 while getting standing weight--did not hit floor Give Tylenol  Q8 for now and can continue short-term in the outpatient  Recent treatment for pneumonia hold doxycycline  and cefdinir from PTA-this is not PNA -- recent Rx 6 weeks ago 1 course of antibiotics and then given another course recently  Underlying CAD, sick sinus syndrome not on anticoagulation secondary to diverticular bleed  Diuresis as above  PE, LLE DVT not on anticoagulation secondary to GI bleed Has IVC filter Is on daily aspirin  only would continue the same  Previous right hip fracture 10/2022 Tylenol  as per above  TIA 12/2020 Aspirin  81 daily, continue Crestor 40 as outpatient  Pancreatic lesion on CT 07/2022 with thoracic aortic aneurysm Please will need outpatient characterization and discussion with her PCP-to be addressed and discussed as an outpatient  Crohn's disease Continue Pentasa  1000 a.m. and p.m., continuing pantoprazole  40 daily and acidophilus 2 capsules daily May use MiraLAX  as needed  Sacral decubitus/pressure injury-dressing with Thera honey or ABD pad and use Desitin additionally per wound nurse  Updated patient alone  DVT prophylaxis: Lovenox   Status is: Inpatient Remains inpatient appropriate because:   Requires  inpatient monitoring and diuresis  Subjective:  Some pain in both knee--no cp no cough  feels drier--passing good urine.  Less wheeze No HA  Objective + exam Vitals:   06/28/23 0655 06/28/23 0742 06/28/23 0852 06/28/23 1156  BP:  122/70  108/70  Pulse:  92  75  Resp:  19  20  Temp:  98.5 F (36.9 C)  98.3 F (36.8 C)  TempSrc:  Oral  Oral  SpO2:  100% 92% 91%  Weight: 84.1 kg      Filed Weights   06/28/23 0655  Weight: 84.1 kg    Examination:  EOMI NCAT Thick neck Mallampati 3, less JVD n S1-S2 no murmur she has a paced rhythm on monitors without any alarms Abdomen is soft no rebound Ankle edema improved Coherent pleasant Power is 5/5  Data Reviewed: reviewed   CBC    Component Value Date/Time   WBC 6.1 06/26/2023 0500   RBC 3.69 (L) 06/26/2023 0500   HGB 10.9 (L) 06/26/2023 0500   HCT 35.8 (L) 06/26/2023 0500   PLT 211 06/26/2023 0500   MCV 97.0 06/26/2023 0500   MCH 29.5 06/26/2023 0500   MCHC 30.4 06/26/2023 0500   RDW 17.5 (H) 06/26/2023 0500   LYMPHSABS 0.5 (L) 04/07/2023 1711   MONOABS 0.8 04/07/2023 1711   EOSABS 0.0 04/07/2023 1711   BASOSABS 0.0 04/07/2023 1711      Latest Ref Rng & Units 06/28/2023    3:45 AM 06/26/2023    8:46 PM 06/25/2023    2:50 PM  CMP  Glucose 70 - 99 mg/dL 161  096  99   BUN 8 - 23 mg/dL 50  43  44   Creatinine 0.44 - 1.00 mg/dL 0.45  4.09  8.11   Sodium 135 - 145 mmol/L 137  136  136   Potassium 3.5 - 5.1 mmol/L 4.3  4.0  4.6   Chloride 98 - 111 mmol/L 90  94  92   CO2 22 - 32 mmol/L 36  31  32   Calcium  8.9 - 10.3 mg/dL 8.4  8.1  8.1   Total Protein 6.5 - 8.1 g/dL  6.1  6.4   Total Bilirubin 0.0 - 1.2 mg/dL  0.8  0.6   Alkaline Phos 38 - 126 U/L  75  71   AST 15 - 41 U/L  23  26   ALT 0 - 44 U/L  16  17     Scheduled Meds:  acidophilus  2 capsule Oral Daily   aspirin  EC  81 mg Oral Daily   enoxaparin  (LOVENOX ) injection  30 mg Subcutaneous Q24H   fluticasone  furoate-vilanterol  1 puff Inhalation Daily   leptospermum manuka honey  1 Application Topical Daily   liver oil-zinc oxide   Topical BID    melatonin  9 mg Oral QHS   mesalamine   1,000 mg Oral BID   midodrine   5 mg Oral TID   montelukast   10 mg Oral Daily   pantoprazole   40 mg Oral Daily   [START ON 06/29/2023] predniSONE   20 mg Oral QAC breakfast   saccharomyces boulardii  250 mg Oral BID   torsemide   40 mg Oral Daily   Continuous Infusions:  Time  35  Jai-Gurmukh Riki Berninger, MD  [facility]

## 2023-06-29 DIAGNOSIS — J9621 Acute and chronic respiratory failure with hypoxia: Secondary | ICD-10-CM | POA: Diagnosis not present

## 2023-06-29 LAB — BASIC METABOLIC PANEL WITH GFR
Anion gap: 11 (ref 5–15)
BUN: 55 mg/dL — ABNORMAL HIGH (ref 8–23)
CO2: 36 mmol/L — ABNORMAL HIGH (ref 22–32)
Calcium: 8.5 mg/dL — ABNORMAL LOW (ref 8.9–10.3)
Chloride: 89 mmol/L — ABNORMAL LOW (ref 98–111)
Creatinine, Ser: 1.47 mg/dL — ABNORMAL HIGH (ref 0.44–1.00)
GFR, Estimated: 33 mL/min — ABNORMAL LOW (ref >60)
Glucose, Bld: 132 mg/dL — ABNORMAL HIGH (ref 70–99)
Potassium: 4.1 mmol/L (ref 3.5–5.1)
Sodium: 136 mmol/L (ref 135–145)

## 2023-06-29 LAB — CBC WITH DIFFERENTIAL/PLATELET
Abs Immature Granulocytes: 0.02 10*3/uL (ref 0.00–0.07)
Basophils Absolute: 0 10*3/uL (ref 0.0–0.1)
Basophils Relative: 0 %
Eosinophils Absolute: 0 10*3/uL (ref 0.0–0.5)
Eosinophils Relative: 0 %
HCT: 34.1 % — ABNORMAL LOW (ref 36.0–46.0)
Hemoglobin: 10.8 g/dL — ABNORMAL LOW (ref 12.0–15.0)
Immature Granulocytes: 0 %
Lymphocytes Relative: 11 %
Lymphs Abs: 0.6 10*3/uL — ABNORMAL LOW (ref 0.7–4.0)
MCH: 29.8 pg (ref 26.0–34.0)
MCHC: 31.7 g/dL (ref 30.0–36.0)
MCV: 93.9 fL (ref 80.0–100.0)
Monocytes Absolute: 0.5 10*3/uL (ref 0.1–1.0)
Monocytes Relative: 9 %
Neutro Abs: 4.4 10*3/uL (ref 1.7–7.7)
Neutrophils Relative %: 80 %
Platelets: 294 10*3/uL (ref 150–400)
RBC: 3.63 MIL/uL — ABNORMAL LOW (ref 3.87–5.11)
RDW: 17.9 % — ABNORMAL HIGH (ref 11.5–15.5)
WBC: 5.5 10*3/uL (ref 4.0–10.5)
nRBC: 0 % (ref 0.0–0.2)

## 2023-06-29 MED ORDER — IPRATROPIUM-ALBUTEROL 0.5-2.5 (3) MG/3ML IN SOLN
3.0000 mL | RESPIRATORY_TRACT | Status: DC
  Administered 2023-06-29: 3 mL via RESPIRATORY_TRACT
  Filled 2023-06-29: qty 3

## 2023-06-29 MED ORDER — IPRATROPIUM-ALBUTEROL 0.5-2.5 (3) MG/3ML IN SOLN
3.0000 mL | Freq: Four times a day (QID) | RESPIRATORY_TRACT | Status: DC
  Administered 2023-06-29: 3 mL via RESPIRATORY_TRACT
  Filled 2023-06-29: qty 3

## 2023-06-29 MED ORDER — PREDNISONE 20 MG PO TABS
60.0000 mg | ORAL_TABLET | Freq: Every day | ORAL | Status: AC
  Administered 2023-06-30 – 2023-07-01 (×2): 60 mg via ORAL
  Filled 2023-06-29 (×2): qty 3

## 2023-06-29 MED ORDER — AZITHROMYCIN 250 MG PO TABS
500.0000 mg | ORAL_TABLET | Freq: Every day | ORAL | Status: AC
  Administered 2023-06-29 – 2023-06-30 (×2): 500 mg via ORAL
  Filled 2023-06-29: qty 2

## 2023-06-29 NOTE — Progress Notes (Signed)
 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  RV overload small pericardial effusion--has history of low output HF-she is on midodrine  CAD PCI with stenting to RCA 2017, sick sinus syndrome with prior heart block PPM generator exchange 07/31/2022 COPD with chronic respiratory failure about 4 L at baseline at home PE, LLE DVT with IVC filter placed during 02/07/2023-02/19/2023 admission Prior?  Diverticular bleed on admission 12/28/2022 and taken off of anticoagulation-CT angiogram did not show acute bleed but showed severe sigmoid diverticulosis and received 3 units PRBC-also known history of Crohn's disease on Pentasa  Prior right intertrochanteric femoral fracture secondary to fall 10/28/2022 complicated by postop ileus at that admission with lengthy stay TIA 12/2020 Known small pancreatic lesion on CT study 07/2022 with thoracic aortic aneurysm  Coughing X 1 month with history of COPD and not improved over the past several weeks brought [facility] ED placed on 6 L instead of 4 L Sodium 136 potassium 4.6 BUN/creatinine 44/1.6 LFTs normal bilirubin normal BNP 2051--Troponin 41--- trend flat WBC 6.4 hemoglobin 9.1 platelet 259 lactic acid 1.0 CXR cardiomegaly small bilateral pleural effusions mild central vascular congestion?  Mild diffuse interstitial process secondary to edema  Diuresing and treating both heart failure and mild COPD and is improving slowly She is not ready for discharge as she has slightly increasing respiratory requirements and is still wheezing  Plan  Multifactorial acute hypoxic respiratory failure  Acute exacerbation COPD 4 L at home,   Community-acquired pneumonia recent treatment with 2 courses of antibiotics  Trialed lower dose of prednisone  20-she is wheezing more today and required 2 nebs overnight ---resume 60 mg--needs to be less wheezy more mobile etc.-wean oxygen today back to home levels and try to move around in bed Will give another 2 days of azithromycin  Continue Breo Ellipta  1 puff daily, Singulair  10, albuterol  change to DuoNeb see if this makes a difference  Recent pneumonia Doxycycline  cefdinir from PTA held  Acute superimposed HFpEF last echo 55-60%//PAH low output HF-BNP on arrival 2051 -5.1 L weight stable at 84 approaching euvolemia Does require midodrine  5 3 times daily for blood pressure support given no output heart failure Diuretics on hold given rise in creatinine-?  Resume Demadex  20 tomorrow-have to be careful with regards to dosing of meds  Underlying CAD sick sinus syndrome with pacemaker not on anticoagulation 2/2 diverticular bleed Continues on monitors  Near fall on 6/19 Did not actually fall-seems stable given Tylenol  and she is okay without much pain in the knees today  PE with LLE DVT not on anticoagulation as above Continue IVC filter, aspirin  80  TIA 12/22-aspirin  81 daily --resume as outpatient Crestor 40  Crohn's disease--reflux  Stable-Pentasa  1000 twice daily Protonix  40 daily acidophilus twice daily as needed MiraLAX   Prior to admission sacral decubitus Desitin + therapeutic honey to the wound  Coincidental pancreatic lesion CT 07/2022 and thoracic aortic aneurysm Needs outpatient attention and care coordination via PCP nonemergent  Previous right hip fracture 10/2022 Would use mainly Tylenol  1000 every 8 as needed mild pain-pain seems controlled overall  DVT prophylaxis: Lovenox   Status is: Inpatient Remains inpatient appropriate because:   Requires inpatient monitoring and diuresis  Subjective:  Overall poor sleep needed nebulizations X2 overnight quite  wheezy today Does not feel as well Producing brownish sputum  Objective + exam Vitals:   06/29/23 0021 06/29/23 0550 06/29/23 0752 06/29/23 0822  BP: 104/60 118/65 (!) 102/59   Pulse: 88 80 84 82  Resp: 17 15 17  (!) 22  Temp: 98 F (36.7 C) 98.2 F (36.8 C) 98.4 F (36.9 C)   TempSrc: Oral Oral Oral   SpO2: 94% 99% 99% 97%  Weight:  84.1 kg     Filed Weights   06/28/23 0655 06/29/23 0550  Weight: 84.1 kg 84.1 kg    Examination:  EOMI NCAT Thick neck Mallampati 3, less JVD n S1-S2 no murmur she has a paced rhythm on monitors without any alarms Abdomen is soft no rebound Ankle edema improved Coherent pleasant Power is 5/5  Data Reviewed: reviewed   CBC    Component Value Date/Time   WBC 5.5 06/29/2023 0517   RBC 3.63 (L) 06/29/2023 0517   HGB 10.8 (L) 06/29/2023 0517   HCT 34.1 (L) 06/29/2023 0517   PLT 294 06/29/2023 0517   MCV 93.9 06/29/2023 0517   MCH 29.8 06/29/2023 0517   MCHC 31.7 06/29/2023 0517   RDW 17.9 (H) 06/29/2023 0517   LYMPHSABS 0.6 (L) 06/29/2023 0517   MONOABS 0.5 06/29/2023 0517   EOSABS 0.0 06/29/2023 0517   BASOSABS 0.0 06/29/2023 0517      Latest Ref Rng & Units 06/29/2023    5:17 AM 06/28/2023    3:45 AM 06/26/2023    8:46 PM  CMP  Glucose 70 - 99 mg/dL 867  870  873   BUN 8 - 23 mg/dL 55  50  43   Creatinine 0.44 - 1.00 mg/dL 8.52  8.48  8.53   Sodium 135 - 145 mmol/L 136  137  136   Potassium 3.5 - 5.1 mmol/L 4.1  4.3  4.0   Chloride 98 - 111 mmol/L 89  90  94   CO2 22 - 32 mmol/L 36  36  31   Calcium  8.9 - 10.3 mg/dL 8.5  8.4  8.1   Total Protein 6.5 - 8.1 g/dL   6.1   Total Bilirubin 0.0 - 1.2 mg/dL   0.8   Alkaline Phos 38 - 126 U/L   75   AST 15 - 41 U/L   23   ALT 0 - 44 U/L   16     Scheduled Meds:  acidophilus  2 capsule Oral Daily   aspirin  EC  81 mg Oral Daily   enoxaparin  (LOVENOX ) injection  30 mg Subcutaneous Q24H   fluticasone  furoate-vilanterol  1 puff Inhalation Daily   leptospermum manuka honey  1  Application Topical Daily   liver oil-zinc  oxide   Topical BID   melatonin  9 mg Oral QHS   mesalamine   1,000 mg Oral BID   midodrine   5 mg Oral TID   montelukast   10 mg Oral Daily   pantoprazole   40 mg Oral Daily   [START ON 06/30/2023] predniSONE   60 mg Oral QAC breakfast   saccharomyces boulardii  250 mg Oral BID   torsemide   40 mg Oral Daily   Continuous Infusions:  Time  25  Jai-Gurmukh Lilyahna Sirmon, MD  [facility]

## 2023-06-29 NOTE — Evaluation (Signed)
 Physical Therapy Evaluation Patient Details Name: Felicia Benson MRN: [id number] DOB: December 21, 1930 Today's Date: 06/29/2023  History of Present Illness  Felicia Benson is a 89 y.o. female admitted 06/25/23 for acute on chronic hypoxic respiratory failure. PMHx: AAA, diastolic CHF, chron's disease, COPD on 4L O2 at baseline, h/o DVT and PE with IVC filter in place, diverticular disease, GERD, GI bleed, OSA, CAD s/p pacemaker.   Clinical Impression  Pt admitted with above diagnosis. PTA, pt was residing at [facility] receiving therapy services. She required some assist with functional mobility using RW with a w/c follow during gait and reports modI with ADLs. Pt currently with functional limitations due to the deficits listed below (see PT Problem List). She required CGA for transfers using RW. Pt declined ambulation d/t fear of falling and the lack of a chair follow present. She is currently requiring increased supplemental oxygen compared to baseline. Pt will benefit from acute skilled PT to increase her independence and safety with mobility to allow discharge. Recommend return to post-acute rehab of <3 hours/day of therapy.       If plan is discharge home, recommend the following: A lot of help with walking and/or transfers;A little help with bathing/dressing/bathroom;Assistance with cooking/housework;Direct supervision/assist for medications management;Assist for transportation;Help with stairs or ramp for entrance   Can travel by private vehicle   No    Equipment Recommendations None recommended by PT (Pt already has DME: RW, SPC, shower seat, and BSC/3in1)  Recommendations for Other Services       Functional Status Assessment Patient has had a recent decline in their functional status and demonstrates the ability to make significant improvements in function in a reasonable and predictable amount of time.     Precautions / Restrictions Precautions Precautions: Fall Recall of  Precautions/Restrictions: Intact Precaution/Restrictions Comments: Impaired Vision Restrictions Weight Bearing Restrictions Per Provider Order: No      Mobility  Bed Mobility Overal bed mobility: Modified Independent             General bed mobility comments: Pt performed supine>sit on R side of the bed with HOB elevated and use of bedrail.    Transfers Overall transfer level: Needs assistance Equipment used: Rolling walker (2 wheels) Transfers: Sit to/from Stand, Bed to chair/wheelchair/BSC Sit to Stand: Contact guard assist, From elevated surface   Step pivot transfers: Contact guard assist, From elevated surface       General transfer comment: Pt stood from raised bed height. She demonstrated proper hand placement using RW. CGA to power up and safely transfer to recliner chair on right. Pt manuevered RW well. Good eccentric control.    Ambulation/Gait               General Gait Details: Pt declined to attempt gait d/t fear of falling and the lack of chair follow available.  Stairs            Wheelchair Mobility     Tilt Bed    Modified Rankin (Stroke Patients Only)       Balance Overall balance assessment: Needs assistance Sitting-balance support: Bilateral upper extremity supported, Feet supported Sitting balance-Leahy Scale: Good     Standing balance support: Bilateral upper extremity supported, During functional activity, Reliant on assistive device for balance Standing balance-Leahy Scale: Poor Standing balance comment: Pt dependent on RW and CGA for stability  Pertinent Vitals/Pain Pain Assessment Pain Assessment: Faces Faces Pain Scale: Hurts little more Pain Location: B knees Pain Descriptors / Indicators: Discomfort, Aching, Sore Pain Intervention(s): Monitored during session, Repositioned    Home Living Family/patient expects to be discharged to:: Skilled nursing facility                    Additional Comments: Pt from [facility]. She is a retired Engineer, civil (consulting).    Prior Function Prior Level of Function : Needs assist;History of Falls (last six months)       Physical Assist : Mobility (physical);ADLs (physical) Mobility (physical): Gait;Stairs   Mobility Comments: Pt reports she has been ambulating ~43ft using RW with a w/c follow during her PT sessions. She is modI for bed mobility and  transfers using RW to w/c. Pt has had 2 falls recently d/t knees buckling. ADLs Comments: Pt reports she is modI with all ADLs. Manages her own medications.     Extremity/Trunk Assessment   Upper Extremity Assessment Upper Extremity Assessment: Defer to OT evaluation    Lower Extremity Assessment Lower Extremity Assessment: Generalized weakness    Cervical / Trunk Assessment Cervical / Trunk Assessment: Kyphotic  Communication   Communication Communication: No apparent difficulties    Cognition Arousal: Alert Behavior During Therapy: WFL for tasks assessed/performed, Anxious   PT - Cognitive impairments: No apparent impairments                       PT - Cognition Comments: Pt A,Ox4. She is fearful of falling. Following commands: Intact       Cueing Cueing Techniques: Verbal cues     General Comments General comments (skin integrity, edema, etc.): VSS on 6L O2 via [place].    Exercises     Assessment/Plan    PT Assessment Patient needs continued PT services  PT Problem List Decreased strength;Decreased activity tolerance;Decreased balance;Decreased mobility;Cardiopulmonary status limiting activity       PT Treatment Interventions DME instruction;Gait training;Functional mobility training;Therapeutic activities;Therapeutic exercise;Balance training;Patient/family education    PT Goals (Current goals can be found in the Care Plan section)  Acute Rehab PT Goals Patient Stated Goal: Return to rehab and get stronger PT Goal Formulation: With  patient Time For Goal Achievement: 07/13/23 Potential to Achieve Goals: Good    Frequency Min 2X/week     Co-evaluation               AM-PAC PT 6 Clicks Mobility  Outcome Measure Help needed turning from your back to your side while in a flat bed without using bedrails?: None Help needed moving from lying on your back to sitting on the side of a flat bed without using bedrails?: None Help needed moving to and from a bed to a chair (including a wheelchair)?: A Little Help needed standing up from a chair using your arms (e.g., wheelchair or bedside chair)?: A Little Help needed to walk in hospital room?: Total Help needed climbing 3-5 steps with a railing? : Total 6 Click Score: 16    End of Session Equipment Utilized During Treatment: Gait belt;Oxygen Activity Tolerance: Patient tolerated treatment well Patient left: in chair;with call bell/phone within reach;with chair alarm set Nurse Communication: Mobility status PT Visit Diagnosis: Muscle weakness (generalized) (M62.81);Difficulty in walking, not elsewhere classified (R26.2);Unsteadiness on feet (R26.81)    Time: 8484-8457 PT Time Calculation (min) (ACUTE ONLY): 27 min   Charges:   PT Evaluation $PT Eval Moderate Complexity: 1 Mod  PT General Charges $$ ACUTE PT VISIT: 1 Visit         Randall SAUNDERS, PT, DPT Acute Rehabilitation Services Office: [phone] Secure Chat Preferred  Delon CHRISTELLA Callander 06/29/2023, 4:34 PM

## 2023-06-30 DIAGNOSIS — J9621 Acute and chronic respiratory failure with hypoxia: Secondary | ICD-10-CM | POA: Diagnosis not present

## 2023-06-30 LAB — CBC WITH DIFFERENTIAL/PLATELET
Abs Immature Granulocytes: 0.02 10*3/uL (ref 0.00–0.07)
Basophils Absolute: 0 10*3/uL (ref 0.0–0.1)
Basophils Relative: 0 %
Eosinophils Absolute: 0.1 10*3/uL (ref 0.0–0.5)
Eosinophils Relative: 2 %
HCT: 33.4 % — ABNORMAL LOW (ref 36.0–46.0)
Hemoglobin: 10.7 g/dL — ABNORMAL LOW (ref 12.0–15.0)
Immature Granulocytes: 0 %
Lymphocytes Relative: 17 %
Lymphs Abs: 1.1 10*3/uL (ref 0.7–4.0)
MCH: 30.2 pg (ref 26.0–34.0)
MCHC: 32 g/dL (ref 30.0–36.0)
MCV: 94.4 fL (ref 80.0–100.0)
Monocytes Absolute: 0.9 10*3/uL (ref 0.1–1.0)
Monocytes Relative: 14 %
Neutro Abs: 4.4 10*3/uL (ref 1.7–7.7)
Neutrophils Relative %: 67 %
Platelets: 293 10*3/uL (ref 150–400)
RBC: 3.54 MIL/uL — ABNORMAL LOW (ref 3.87–5.11)
RDW: 17.9 % — ABNORMAL HIGH (ref 11.5–15.5)
WBC: 6.5 10*3/uL (ref 4.0–10.5)
nRBC: 0 % (ref 0.0–0.2)

## 2023-06-30 LAB — BASIC METABOLIC PANEL WITH GFR
Anion gap: 9 (ref 5–15)
BUN: 52 mg/dL — ABNORMAL HIGH (ref 8–23)
CO2: 38 mmol/L — ABNORMAL HIGH (ref 22–32)
Calcium: 8.1 mg/dL — ABNORMAL LOW (ref 8.9–10.3)
Chloride: 89 mmol/L — ABNORMAL LOW (ref 98–111)
Creatinine, Ser: 1.6 mg/dL — ABNORMAL HIGH (ref 0.44–1.00)
GFR, Estimated: 30 mL/min — ABNORMAL LOW (ref >60)
Glucose, Bld: 82 mg/dL (ref 70–99)
Potassium: 3.4 mmol/L — ABNORMAL LOW (ref 3.5–5.1)
Sodium: 136 mmol/L (ref 135–145)

## 2023-06-30 MED ORDER — MIDODRINE HCL 5 MG PO TABS
10.0000 mg | ORAL_TABLET | Freq: Three times a day (TID) | ORAL | Status: DC
  Administered 2023-06-30 – 2023-07-01 (×3): 10 mg via ORAL
  Filled 2023-06-30 (×3): qty 2

## 2023-06-30 MED ORDER — IPRATROPIUM-ALBUTEROL 0.5-2.5 (3) MG/3ML IN SOLN
3.0000 mL | Freq: Two times a day (BID) | RESPIRATORY_TRACT | Status: DC
  Administered 2023-06-30: 3 mL via RESPIRATORY_TRACT
  Filled 2023-06-30: qty 3

## 2023-06-30 MED ORDER — IPRATROPIUM-ALBUTEROL 0.5-2.5 (3) MG/3ML IN SOLN
3.0000 mL | RESPIRATORY_TRACT | Status: DC | PRN
  Administered 2023-06-30: 3 mL via RESPIRATORY_TRACT
  Filled 2023-06-30: qty 3

## 2023-06-30 NOTE — Progress Notes (Signed)
 Patient refused CPAP for the night

## 2023-06-30 NOTE — Progress Notes (Signed)
 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  RV overload small pericardial effusion--has history of low output HF-she is on midodrine  CAD PCI with stenting to RCA 2017, sick sinus syndrome with prior heart block PPM generator exchange 07/31/2022 COPD with chronic respiratory failure about 4 L at baseline at home PE, LLE DVT with IVC filter placed during 02/07/2023-02/19/2023 admission Prior?  Diverticular bleed on admission 12/28/2022 and taken off of anticoagulation-CT angiogram did not show acute bleed but showed severe sigmoid diverticulosis and received 3 units PRBC-also known history of Crohn's disease on Pentasa  Prior right intertrochanteric femoral fracture secondary to fall 10/28/2022 complicated by postop ileus at that admission with lengthy stay TIA 12/2020 Known small pancreatic lesion on CT study 07/2022 with thoracic aortic aneurysm  Coughing X 1 month with history of COPD and not improved over the past several weeks brought [facility] ED placed on 6 L instead of 4 L Sodium 136 potassium 4.6 BUN/creatinine 44/1.6 LFTs normal bilirubin normal BNP 2051--Troponin 41--- trend flat WBC 6.4 hemoglobin 9.1 platelet 259 lactic acid 1.0 CXR cardiomegaly small bilateral pleural effusions mild central vascular congestion?  Mild diffuse interstitial process secondary to edema  Diuresing and treating both heart failure and mild COPD and is improving slowly  Plan  Multifactorial acute hypoxic respiratory failure on admit  Recent treatment community-acquired pneumonia 2 courses-does not require further antibiotics Acute COPD exacerbation baseline 4  L/home Continues prednisone  60 higher dosing than prior, resumed azithromycin  EOT 6/23 Breo Ellipta  1 puff daily, DuoNeb, Singulair  10  Acute superimposed HFpEF last echo 55-60% with PAH low output HF-BNP 2051 -7 L fluid-keeping volumes even--weight stable at 84 kg--- received demadex  40 yesterday---on hold for now may need to go to every other day at discharge Increasing midodrine  to 10 3 times daily given low normal pressures and history low output heart failure  Previous CAD sick sinus syndrome PPM not on anticoagulation from prior diverticular bleed  PE with LLE DVT not on anticoagulation IVC filter in place, aspirin  81 mg TIA 12/2020 Aspirin  81, Crestor as outpatient 40  Crohn's disease and reflux Continues Pentasa  1000 twice daily Protonix  40 daily MiraLAX  17 twice daily as needed mild constipation acidophilus  OSA Implemented auto titration CPAP and she slept better within will order for discharge  Previous right hip fracture 10/2022-Tylenol  as above  PTA's sacral decubitus Desitin and therapeutic Co. to wound  Near fall 6/19--Tylenol  helping with pain  Discussed with family at the bedside  DVT prophylaxis: Lovenox   Status is: Inpatient Remains inpatient appropriate because:   Requires inpatient monitoring and diuresis  Subjective:  Feels much better than she has overall no distress no chest pain no chills used CPAP overnight  Objective + exam Vitals:   06/30/23 0628 06/30/23 0804 06/30/23 0808 06/30/23 0838  BP:  114/70  137/71  Pulse:  93 97   Resp:  19 19 20   Temp:    98.3 F (36.8 C)  TempSrc:  Oral  Oral  SpO2:  91% 91% 97%  Weight: 84.6 kg      Filed Weights   06/28/23 0655 06/29/23 0550 06/30/23 0628  Weight: 84.1 kg 84.1 kg 84.6  kg    Examination:  EOMI NCAT Thick neck Mallampati 3, some JVD S1-S2 paced on monitors Abdomen soft no rebound no guarding Some mild crackles posterolaterally Power 5/5  Data Reviewed: reviewed   CBC     Component Value Date/Time   WBC 6.5 06/30/2023 0438   RBC 3.54 (L) 06/30/2023 0438   HGB 10.7 (L) 06/30/2023 0438   HCT 33.4 (L) 06/30/2023 0438   PLT 293 06/30/2023 0438   MCV 94.4 06/30/2023 0438   MCH 30.2 06/30/2023 0438   MCHC 32.0 06/30/2023 0438   RDW 17.9 (H) 06/30/2023 0438   LYMPHSABS 1.1 06/30/2023 0438   MONOABS 0.9 06/30/2023 0438   EOSABS 0.1 06/30/2023 0438   BASOSABS 0.0 06/30/2023 0438      Latest Ref Rng & Units 06/30/2023    4:38 AM 06/29/2023    5:17 AM 06/28/2023    3:45 AM  CMP  Glucose 70 - 99 mg/dL 82  867  870   BUN 8 - 23 mg/dL 52  55  50   Creatinine 0.44 - 1.00 mg/dL 8.39  8.52  8.48   Sodium 135 - 145 mmol/L 136  136  137   Potassium 3.5 - 5.1 mmol/L 3.4  4.1  4.3   Chloride 98 - 111 mmol/L 89  89  90   CO2 22 - 32 mmol/L 38  36  36   Calcium  8.9 - 10.3 mg/dL 8.1  8.5  8.4     Scheduled Meds:  acidophilus  2 capsule Oral Daily   aspirin  EC  81 mg Oral Daily   enoxaparin  (LOVENOX ) injection  30 mg Subcutaneous Q24H   fluticasone  furoate-vilanterol  1 puff Inhalation Daily   leptospermum manuka honey  1 Application Topical Daily   liver oil-zinc  oxide   Topical BID   melatonin  9 mg Oral QHS   mesalamine   1,000 mg Oral BID   midodrine   10 mg Oral TID   montelukast   10 mg Oral Daily   pantoprazole   40 mg Oral Daily   predniSONE   60 mg Oral QAC breakfast   Continuous Infusions:  Time  35  Jai-Gurmukh Jurnee Nakayama, MD  [facility]

## 2023-06-30 NOTE — Progress Notes (Signed)
 Patient requested to take CPAP off for the rest of the night. Patient educated on need of CPAP, pt still refused to wear for remainder of night. Placed 5L [place] back on, O2 sats 95-98% on 5L [place].

## 2023-07-01 DIAGNOSIS — J9621 Acute and chronic respiratory failure with hypoxia: Secondary | ICD-10-CM | POA: Diagnosis not present

## 2023-07-01 LAB — CBC WITH DIFFERENTIAL/PLATELET
Abs Immature Granulocytes: 0.02 10*3/uL (ref 0.00–0.07)
Basophils Absolute: 0 10*3/uL (ref 0.0–0.1)
Basophils Relative: 0 %
Eosinophils Absolute: 0 10*3/uL (ref 0.0–0.5)
Eosinophils Relative: 0 %
HCT: 33.1 % — ABNORMAL LOW (ref 36.0–46.0)
Hemoglobin: 10.5 g/dL — ABNORMAL LOW (ref 12.0–15.0)
Immature Granulocytes: 0 %
Lymphocytes Relative: 8 %
Lymphs Abs: 0.7 10*3/uL (ref 0.7–4.0)
MCH: 29.7 pg (ref 26.0–34.0)
MCHC: 31.7 g/dL (ref 30.0–36.0)
MCV: 93.5 fL (ref 80.0–100.0)
Monocytes Absolute: 0.8 10*3/uL (ref 0.1–1.0)
Monocytes Relative: 9 %
Neutro Abs: 7.2 10*3/uL (ref 1.7–7.7)
Neutrophils Relative %: 83 %
Platelets: 274 10*3/uL (ref 150–400)
RBC: 3.54 MIL/uL — ABNORMAL LOW (ref 3.87–5.11)
RDW: 17.9 % — ABNORMAL HIGH (ref 11.5–15.5)
WBC: 8.6 10*3/uL (ref 4.0–10.5)
nRBC: 0 % (ref 0.0–0.2)

## 2023-07-01 LAB — BASIC METABOLIC PANEL WITH GFR
Anion gap: 11 (ref 5–15)
BUN: 50 mg/dL — ABNORMAL HIGH (ref 8–23)
CO2: 36 mmol/L — ABNORMAL HIGH (ref 22–32)
Calcium: 8.2 mg/dL — ABNORMAL LOW (ref 8.9–10.3)
Chloride: 90 mmol/L — ABNORMAL LOW (ref 98–111)
Creatinine, Ser: 1.45 mg/dL — ABNORMAL HIGH (ref 0.44–1.00)
GFR, Estimated: 34 mL/min — ABNORMAL LOW (ref >60)
Glucose, Bld: 101 mg/dL — ABNORMAL HIGH (ref 70–99)
Potassium: 3.8 mmol/L (ref 3.5–5.1)
Sodium: 137 mmol/L (ref 135–145)

## 2023-07-01 MED ORDER — TORSEMIDE 40 MG PO TABS: 40.0000 mg | ORAL_TABLET | ORAL | Status: DC

## 2023-07-01 MED ORDER — ZINC OXIDE 40 % EX OINT: TOPICAL_OINTMENT | Freq: Two times a day (BID) | CUTANEOUS | 0 refills | Status: AC

## 2023-07-01 MED ORDER — PREDNISONE 20 MG PO TABS: ORAL_TABLET | ORAL | Status: AC

## 2023-07-01 MED ORDER — TORSEMIDE 40 MG PO TABS: 20.0000 mg | ORAL_TABLET | ORAL | Status: DC

## 2023-07-01 MED ORDER — MEDIHONEY WOUND/BURN DRESSING EX PSTE: 1.0000 | PASTE | Freq: Every day | CUTANEOUS | 0 refills | Status: DC

## 2023-07-01 MED ORDER — MIDODRINE HCL 10 MG PO TABS: 5.0000 mg | ORAL_TABLET | Freq: Three times a day (TID) | ORAL | Status: DC

## 2023-07-01 NOTE — NC FL2 (Signed)
 [place]  MEDICAID FL2 LEVEL OF CARE FORM     IDENTIFICATION  Patient Name: Felicia Benson Birthdate: [birth year] Sex: female Admission Date (Current Location): 06/25/2023  [facility] and [place] Number:  Producer, television/film/video and Address:  [facility] [place], [address], [place], [place] [address]      Provider Number: [id number]  Attending Physician Name and Address:  Royal Sill, MD  Relative Name and Phone Number:  [name]; Daughter; [phone]    Current Level of Care: Hospital Recommended Level of Care: Skilled Nursing Facility Prior Approval Number:    Date Approved/Denied:   PASRR Number: [id number]  Discharge Plan: SNF    Current Diagnoses: Patient Active Problem List   Diagnosis Date Noted   Acute on chronic respiratory failure with hypoxemia (HCC) 06/25/2023   Myocardial injury 04/08/2023   Sacral ulcer (HCC) 04/08/2023   Hypokalemia 04/08/2023   Goals of care, counseling/discussion 04/08/2023   AKI (acute kidney injury) (HCC) 03/13/2023   Acute on chronic respiratory failure with hypoxia (HCC) 03/08/2023   Elevated troponin 03/08/2023   Acute exacerbation of CHF (congestive heart failure) (HCC) 03/07/2023   Right ventricular failure (HCC) 02/15/2023   Shortness of breath 02/13/2023   DVT (deep venous thrombosis) (HCC) 02/11/2023   Acute pulmonary embolism (HCC) 02/08/2023   Acute respiratory failure with hypoxia (HCC) 02/08/2023   CAP (community acquired pneumonia) 02/07/2023   Filling defect on imaging study 02/07/2023   CKD (chronic kidney disease) 02/07/2023   Pneumonia 02/07/2023   Diverticular hemorrhage 12/28/2022   Ileus, postoperative (HCC) 11/04/2022   Closed right hip fracture (HCC) 10/28/2022   Fall at home, initial encounter 10/28/2022   History of COPD 10/28/2022   Allergic rhinitis 10/28/2022   Anemia of chronic disease 10/28/2022   Chronic rhinitis 08/06/2022   OSA (obstructive sleep apnea)  08/06/2022   Heart failure, acute diastolic (HCC) 07/21/2022   CKD stage 3a, GFR 45-59 ml/min (HCC) 07/21/2022   Thoracic aortic aneurysm (HCC) 07/21/2022   GERD (gastroesophageal reflux disease) 07/21/2022   Pancreatic lesion 07/21/2022   Macular degeneration 07/21/2022   Hypocalcemia 07/21/2022   Insomnia 07/21/2022   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   TIA (transient ischemic attack) 12/31/2020   Acute on chronic diastolic heart failure (HCC) 05/19/2019   Acute CHF (congestive heart failure) (HCC) 05/18/2019   Chronic hypoxic respiratory failure (HCC) 05/18/2019   COPD (chronic obstructive pulmonary disease) (HCC) 05/18/2019   History of cardiac pacemaker 05/18/2019   AAA (abdominal aortic aneurysm) 05/18/2019   Hyperlipidemia 05/18/2019   Abdominal pain in female 05/30/2015   Essential hypertension 05/30/2015   Crohn's disease (HCC) 05/30/2015   Gastrointestinal hemorrhage with melena 05/30/2015   Blood loss anemia 05/30/2015   Hyponatremia 05/30/2015   Kidney disease 05/30/2015   Normocytic anemia 05/30/2015   CAD S/P percutaneous coronary angioplasty 05/30/2015   Acute blood loss anemia 05/30/2015   Heme + stool     Orientation RESPIRATION BLADDER Height & Weight     Self, Time, Situation, Place  O2 (5L nasal cannula) Incontinent Weight: 182 lb 12.2 oz (82.9 kg) Height:  5' 6 (167.6 cm)  BEHAVIORAL SYMPTOMS/MOOD NEUROLOGICAL BOWEL NUTRITION STATUS      Incontinent    AMBULATORY STATUS COMMUNICATION OF NEEDS Skin   Extensive Assist Verbally Normal                       Personal Care Assistance Level of Assistance  Bathing, Dressing, Feeding Bathing Assistance:  Maximum assistance Feeding assistance: Maximum assistance Dressing Assistance: Maximum assistance     Functional Limitations Info  Sight, Hearing Sight Info: Impaired (R (Blind) and L (Impaired)) Hearing Info: Impaired (R and L)      SPECIAL CARE FACTORS FREQUENCY                        Contractures Contractures Info: Not present    Additional Factors Info  Code Status, Allergies Code Status Info: DNR-LIMITED -Do Not Intubate/DNI Allergies Info: Motrin (ibuprofen); Ace Inhibitors; Breztri  Aerosphere (budeson-glycopyrrol-formoterol ); Cipro (ciprofloxacin Hcl); Levaquin (levofloxacin); Nsaids           Current Medications (07/01/2023):  This is the current hospital active medication list Current Facility-Administered Medications  Medication Dose Route Frequency Provider Last Rate Last Admin   acetaminophen  (TYLENOL ) tablet 1,000 mg  1,000 mg Oral Q8H PRN Samtani, Jai-Gurmukh, MD   1,000 mg at 06/29/23 2124   acidophilus (RISAQUAD) capsule 2 capsule  2 capsule Oral Daily Samtani, Jai-Gurmukh, MD   2 capsule at 07/01/23 1101   aspirin  EC tablet 81 mg  81 mg Oral Daily Samtani, Jai-Gurmukh, MD   81 mg at 07/01/23 1101   enoxaparin  (LOVENOX ) injection 30 mg  30 mg Subcutaneous Q24H Sim Re L, MD   30 mg at 06/30/23 2115   fluticasone  furoate-vilanterol (BREO ELLIPTA ) 200-25 MCG/ACT 1 puff  1 puff Inhalation Daily Samtani, Jai-Gurmukh, MD   1 puff at 07/01/23 9170   ipratropium-albuterol  (DUONEB) 0.5-2.5 (3) MG/3ML nebulizer solution 3 mL  3 mL Nebulization Q4H PRN Samtani, Jai-Gurmukh, MD   3 mL at 06/30/23 2216   leptospermum manuka honey (MEDIHONEY) paste 1 Application  1 Application Topical Daily Samtani, Jai-Gurmukh, MD   1 Application at 07/01/23 1104   liver oil-zinc  oxide (DESITIN) 40 % ointment   Topical BID Samtani, Jai-Gurmukh, MD   Given at 07/01/23 1103   melatonin tablet 9 mg  9 mg Oral QHS Samtani, Jai-Gurmukh, MD   9 mg at 06/30/23 2116   mesalamine  (PENTASA ) CR capsule 1,000 mg  1,000 mg Oral BID Samtani, Jai-Gurmukh, MD   1,000 mg at 07/01/23 1103   midodrine  (PROAMATINE ) tablet 10 mg  10 mg Oral TID Samtani, Jai-Gurmukh, MD   10 mg at 07/01/23 1101   montelukast  (SINGULAIR ) tablet 10 mg  10 mg Oral Daily Samtani, Jai-Gurmukh, MD   10 mg at  06/30/23 1007   ondansetron  (ZOFRAN ) tablet 4 mg  4 mg Oral Q6H PRN Sim Re CROME, MD       Or   ondansetron  (ZOFRAN ) injection 4 mg  4 mg Intravenous Q6H PRN Sim Re CROME, MD       pantoprazole  (PROTONIX ) EC tablet 40 mg  40 mg Oral Daily Samtani, Jai-Gurmukh, MD   40 mg at 07/01/23 1102   polyethylene glycol (MIRALAX  / GLYCOLAX ) packet 17 g  17 g Oral BID PRN Samtani, Jai-Gurmukh, MD         Discharge Medications: Please see discharge summary for a list of discharge medications.  Relevant Imaging Results:  Relevant Lab Results:   Additional Information [id number]  Lauraine FORBES Saa, LCSW

## 2023-07-01 NOTE — Discharge Summary (Signed)
 Physician Discharge Summary  Felicia Benson FMW:[id number] DOB: 1930/03/30 DOA: 06/25/2023  PCP: Felicia Benson, No Pcp Per  Admit date: 06/25/2023 Discharge date: 07/01/2023  Time spent: 33 minutes  Recommendations for Outpatient Follow-up:  Long course steroid taper as per orders Note dosage/frequency change torsemide  currently Demadex  40 q. OD-needs adjustment in next 1 to 2 days---Noted increased midodrine  dose Needs TOC visit cardiology Dr. 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  RV overload small pericardial effusion--has history of low output HF-she is on midodrine  CAD PCI with stenting to RCA 2017, sick sinus syndrome with prior heart block PPM generator exchange 07/31/2022 COPD with chronic respiratory failure about 4 L at baseline at home PE, LLE DVT with IVC filter placed during 02/07/2023-02/19/2023 admission Prior?  Diverticular bleed on admission 12/28/2022 and taken off of anticoagulation-CT angiogram did not show acute bleed but showed severe sigmoid diverticulosis and received 3 units PRBC-also known history of Crohn's disease on Pentasa  Prior right intertrochanteric femoral fracture secondary to fall 10/28/2022 complicated by postop ileus at that admission with lengthy stay TIA 12/2020 Known small pancreatic lesion on CT  study 07/2022 with thoracic aortic aneurysm   Coughing X 1 month with history of COPD and not improved over the past several weeks brought [facility] ED placed on 6 L instead of 4 L Sodium 136 potassium 4.6 BUN/creatinine 44/1.6 LFTs normal bilirubin normal BNP 2051--Troponin 41--- trend flat WBC 6.4 hemoglobin 9.1 platelet 259 lactic acid 1.0 CXR cardiomegaly small bilateral pleural effusions mild central vascular congestion?  Mild diffuse interstitial process secondary to edema   Diuresing and treating both heart failure and mild COPD and is improving slowly   Plan   Multifactorial acute hypoxic respiratory failure on admit   Recent treatment community-acquired pneumonia 2 courses (cefdinir doxycycline )-does not require further antibiotics for this indication Acute COPD exacerbation baseline 4 L/home Tried to wean prednisone  earlier in hospital stay-placed back on 60 mg with extended taper over the next 15 days as per orders at discharge-May want to extend even further resumed azithromycin  EOT 6/23 and completed the antibiotics Breo Ellipta  1 puff daily, DuoNeb, Singulair  10   Acute superimposed HFpEF last echo 55-60% with PAH low output HF-BNP 2051 -8.5 L fluid-keeping volumes even--dry weight 82 kg-- received demadex  40 until 6/21 --- and temporarily held-currently will go to 40 every other day and if gains weight >2 kg in 24 to 48 hours would give a 20 mg dose Increased midodrine  to 10 3 times daily given low normal pressures and history low output heart failure CC cardiology navigator for assistance with TOC visit   Previous CAD sick sinus syndrome PPM not on anticoagulation from prior diverticular bleed   PE with LLE DVT not on anticoagulation IVC filter in place TIA 12/2020 Aspirin  81, Crestor as outpatient 40   Crohn's disease and reflux Continues Pentasa  1000 twice daily Protonix  40 daily  MiraLAX  17 twice daily as needed mild constipation acidophilus   OSA Implemented auto  titration CPAP and she slept better within will order for discharge Will need adjustment and consideration for CPAP in the outpatient setting   Previous right hip fracture 10/2022-Tylenol  as above   PTA's sacral decubitus Desitin and therapeutic Co. to wound   Near fall 6/19--nonsevere injury-stable Tylenol  helping with pain   Pancreatic lesion CT 07/2022-thoracic aortic aneurysm Outpatient characterization with CT/MRI in 1 to 2 months and discussion with Felicia Benson   Discharge Exam: Vitals:   07/01/23 0818 07/01/23 0829  BP: 115/63   Pulse: 88 97  Resp: 20   Temp: 98.5 F (36.9 C)   SpO2: 97%     Subj on day of d/c   Coherent awake alert no distress looks well on oxygen Laying in bed No sputum Breathing well and does not feel as congested No other issue and no chest pain  General Exam on discharge  EOMI NCAT arcus Synolis thick neck Mallampati 4 S1-S2 no murmur Chest is clear no wheeze no rales no rhonchi Abdomen obese nontender no HSM Trace lower extremity edema stasis dermatitis noted Power 5/5 limited by effort  Discharge Instructions    Allergies as of 07/01/2023       Reactions   Motrin [ibuprofen] Other (See Comments)   GI bleed   Ace Inhibitors Cough   Breztri  Aerosphere [budeson-glycopyrrol-formoterol ] Hypertension   Cipro [ciprofloxacin Hcl] Other (See Comments)   Avoid fluoroquinolones due to asc-aortic aneurysm   Levaquin [levofloxacin] Other (See Comments)   Avoid fluoroquinolones due to asc-aortic aneurysm   Nsaids Nausea And Vomiting, Other (See Comments)   Hx of GI bleed        Medication List     STOP taking these medications    cefdinir 300 MG capsule Commonly known as: OMNICEF   docusate sodium  100 MG capsule Commonly known as: COLACE   doxycycline  100 MG EC tablet Commonly known as: DORYX        TAKE these medications    acetaminophen  500 MG tablet Commonly known as: TYLENOL  Take 1,000 mg by mouth at bedtime. Give with  melatonin   albuterol  108 (90 Base) MCG/ACT inhaler Commonly known as: VENTOLIN  HFA Inhale 2 puffs into the lungs every 6 (six) hours as needed for wheezing or shortness of breath.   Aspercreme Lidocaine  4 % Generic drug: lidocaine  Place 1 patch onto the skin daily as needed (knee pain).   aspirin  EC 81 MG tablet Take 1 tablet (81 mg total) by mouth daily. Swallow whole.   atorvastatin  40 MG tablet Commonly known as: LIPITOR Take 40 mg by mouth at bedtime.   benzonatate  100 MG capsule Commonly known as: TESSALON  Take 100 mg by mouth every 12 (twelve) hours as needed for cough.   cetirizine 10 MG tablet Commonly known as: ZYRTEC Take 10 mg by mouth daily.   cholecalciferol  25 MCG (1000 UNIT) tablet Commonly known as: VITAMIN D3 Take 1,000 Units by mouth daily.   Delsym  30 MG/5ML liquid Generic drug: dextromethorphan  Take 60 mg by mouth 2 (two) times daily as needed for cough.   guaiFENesin  100 MG/5ML liquid Commonly known as: ROBITUSSIN Take 10 mLs by mouth 3 (three) times daily.   ipratropium-albuterol  0.5-2.5 (3) MG/3ML Soln Commonly known as: DUONEB Take 3 mLs by nebulization 3 (three) times daily. 3mL inhale orally every 2 hours as needed for shortness of breath or wheezing for 3 days via nebulizer every 2-4 hours.   leptospermum  manuka honey Pste paste Apply 1 Application topically daily.   liver oil-zinc  oxide 40 % ointment Commonly known as: DESITIN Apply topically 2 (two) times daily.   melatonin 3 MG Tabs tablet Take 9 mg by mouth at bedtime.   midodrine  10 MG tablet Commonly known as: PROAMATINE  Take 0.5 tablets (5 mg total) by mouth 3 (three) times daily. What changed: medication strength   mometasone -formoterol  100-5 MCG/ACT Aero Commonly known as: DULERA  Inhale 2 puffs into the lungs 2 (two) times daily.   montelukast  10 MG tablet Commonly known as: SINGULAIR  Take 1 tablet (10 mg total) by mouth daily.   ondansetron  4 MG tablet Commonly  known as: ZOFRAN  Take 4 mg by mouth every 6 (six) hours as needed for nausea or vomiting.   OXYGEN Inhale 4 L into the lungs continuous.   pantoprazole  40 MG tablet Commonly known as: PROTONIX  Take 40 mg by mouth in the morning and at bedtime.   Pentasa  500 MG CR capsule Generic drug: mesalamine  Take 1,000 mg by mouth in the morning and at bedtime.   polyethylene glycol 17 g packet Commonly known as: MIRALAX  / GLYCOLAX  Take 17 g by mouth 2 (two) times daily as needed for mild constipation or moderate constipation.   predniSONE  20 MG tablet Commonly known as: DELTASONE  Take 3 tablets (60 mg total) by mouth daily before breakfast for 3 days, THEN 2 tablets (40 mg total) daily before breakfast for 5 days, THEN 1 tablet (20 mg total) daily before breakfast for 5 days. Start taking on: July 01, 2023   saccharomyces boulardii 250 MG capsule Commonly known as: FLORASTOR Take 250 mg by mouth 2 (two) times daily.   Torsemide  40 MG Tabs Take 40 mg by mouth every other day. What changed:  medication strength how much to take when to take this               Discharge Care Instructions  (From admission, onward)           Start     Ordered   07/01/23 0000  Discharge wound care:       Comments: 06/26/23 2000    Wound care  Daily      Comments: Cleanse sacral PI with NS, apply Medihoney to wound bed daily and fill in wound with dry gauze.  Secure with silicone foam or ABD pad whichever is preferred   07/01/23 1035   07/01/23 0000  Discharge wound care:       Comments: Wound care  Daily      Comments: Cleanse sacral PI with NS, apply Medihoney to wound bed daily and fill in wound with dry gauze.  Secure with silicone foam or ABD pad whichever is preferred  06/26/23 1823   07/01/23 1043           Allergies  Allergen Reactions   Motrin [Ibuprofen] Other (See Comments)    GI bleed   Ace Inhibitors Cough   Breztri  Aerosphere [Budeson-Glycopyrrol-Formoterol ]  Hypertension   Cipro [Ciprofloxacin Hcl] Other (See Comments)    Avoid fluoroquinolones due to asc-aortic aneurysm   Levaquin [Levofloxacin] Other (See Comments)    Avoid fluoroquinolones due to asc-aortic aneurysm   Nsaids Nausea And Vomiting and Other (See Comments)    Hx of GI bleed      The results of significant diagnostics from this hospitalization (including imaging, microbiology, ancillary and laboratory) are listed below for reference.    Significant Diagnostic Studies: DG Chest 2 View Result Date:  06/25/2023 CLINICAL DATA:  Shortness of breath and cough EXAM: CHEST - 2 VIEW COMPARISON:  04/07/2023 FINDINGS: Left-sided pacing device as before. Cardiomegaly with small bilateral pleural effusions. Mild central vascular congestion and mild diffuse interstitial process. No consolidative airspace disease. Hardware in the cervical spine. Surgical clips at the left thoracic inlet. IMPRESSION: Cardiomegaly with small bilateral pleural effusions and mild central vascular congestion. Mild diffuse interstitial process may be due to edema or infection. Electronically Signed   By: Luke Bun M.D.   On: 06/25/2023 16:03   CUP PACEART REMOTE DEVICE CHECK Result Date: 06/10/2023 PPM Scheduled remote reviewed. Normal device function.  Presenting rhythm:  AP/VP 1 NSVT event 4/24 @ 07:24, V>A, HR 174, >20 beats - route to triage per protocol Next remote 91 days. LA, CVRS   Microbiology: Recent Results (from the past 240 hours)  Resp panel by RT-PCR (RSV, Flu A&B, Covid) Anterior Nasal Swab     Status: None   Collection Time: 06/25/23  2:41 PM   Specimen: Anterior Nasal Swab  Result Value Ref Range Status   SARS Coronavirus 2 by RT PCR NEGATIVE NEGATIVE Final   Influenza A by PCR NEGATIVE NEGATIVE Final   Influenza B by PCR NEGATIVE NEGATIVE Final    Comment: (NOTE) The Xpert Xpress SARS-CoV-2/FLU/RSV plus assay is intended as an aid in the diagnosis of influenza from Nasopharyngeal swab  specimens and should not be used as a sole basis for treatment. Nasal washings and aspirates are unacceptable for Xpert Xpress SARS-CoV-2/FLU/RSV testing.  Fact Sheet for Patients: BloggerCourse.com  Fact Sheet for Healthcare Providers: SeriousBroker.it  This test is not yet approved or cleared by the [place]  FDA and has been authorized for detection and/or diagnosis of SARS-CoV-2 by FDA under an Emergency Use Authorization (EUA). This EUA will remain in effect (meaning this test can be used) for the duration of the COVID-19 declaration under Section 564(b)(1) of the Act, 21 U.S.C. section 360bbb-3(b)(1), unless the authorization is terminated or revoked.     Resp Syncytial Virus by PCR NEGATIVE NEGATIVE Final    Comment: (NOTE) Fact Sheet for Patients: BloggerCourse.com  Fact Sheet for Healthcare Providers: SeriousBroker.it  This test is not yet approved or cleared by the [place]  FDA and has been authorized for detection and/or diagnosis of SARS-CoV-2 by FDA under an Emergency Use Authorization (EUA). This EUA will remain in effect (meaning this test can be used) for the duration of the COVID-19 declaration under Section 564(b)(1) of the Act, 21 U.S.C. section 360bbb-3(b)(1), unless the authorization is terminated or revoked.  Performed at [facility], [address], [place], [place] [address]      Labs: Basic Metabolic Panel: Recent Labs  Lab 06/26/23 2046 06/28/23 0345 06/29/23 0517 06/30/23 0438 07/01/23 0632  NA 136 137 136 136 137  K 4.0 4.3 4.1 3.4* 3.8  CL 94* 90* 89* 89* 90*  CO2 31 36* 36* 38* 36*  GLUCOSE 126* 129* 132* 82 101*  BUN 43* 50* 55* 52* 50*  CREATININE 1.46* 1.51* 1.47* 1.60* 1.45*  CALCIUM  8.1* 8.4* 8.5* 8.1* 8.2*   Liver Function Tests: Recent Labs  Lab 06/25/23 1450 06/26/23 2046  AST 26 23  ALT 17 16  ALKPHOS  71 75  BILITOT 0.6 0.8  PROT 6.4* 6.1*  ALBUMIN 2.3* 2.2*   No results for input(s): LIPASE, AMYLASE in the last 168 hours. No results for input(s): AMMONIA in the last 168 hours. CBC: Recent Labs  Lab 06/25/23 1450  06/26/23 0500 06/29/23 0517 06/30/23 0438 07/01/23 0632  WBC 6.4 6.1 5.5 6.5 8.6  NEUTROABS  --   --  4.4 4.4 7.2  HGB 11.1* 10.9* 10.8* 10.7* 10.5*  HCT 36.0 35.8* 34.1* 33.4* 33.1*  MCV 97.6 97.0 93.9 94.4 93.5  PLT 259 211 294 293 274   Cardiac Enzymes: No results for input(s): CKTOTAL, CKMB, CKMBINDEX, TROPONINI in the last 168 hours. BNP: BNP (last 3 results) Recent Labs    03/14/23 0430 04/07/23 1711 06/25/23 1455  BNP 2,510.3* 2,673.6* 2,051.6*    ProBNP (last 3 results) No results for input(s): PROBNP in the last 8760 hours.  CBG: No results for input(s): GLUCAP in the last 168 hours.  Signed:  Colen Grimes MD   [facility] 07/01/2023, 10:17 AM

## 2023-07-01 NOTE — Progress Notes (Signed)
 Call attempted x1 to give a report to [facility]. No answer.  Loyce Blazing, RN

## 2023-07-01 NOTE — TOC Transition Note (Signed)
 Transition of Care [facility]) - Discharge Note   Patient Details  Name: Felicia Benson MRN: [id number] Date of Birth: 1930/10/23  [facility] CM/SW Contact:  Lauraine FORBES Saa, LCSW Phone Number: 07/01/2023, 1:04 PM   Clinical Narrative:     Patient will DC to: [facility] L[facility] Anticipated DC date: 07/01/2023 Family notified: [name]; Daughter; [phone] Transport by: [facility]   Per MD patient ready for DC to [facility] SNF LTC. RN to call report prior to discharge [phone]). RN, patient, patient's family (called by patient), and facility notified of DC. Discharge Summary and FL2 sent to facility. DC packet on chart. Ambulance transport requested for patient at 13:05..   CSW will sign off for now as social work intervention is no longer needed. Please consult us  again if new needs arise.    Final next level of care: Skilled Nursing Facility Barriers to Discharge: Barriers Resolved   Patient Goals and CMS Choice Patient states their goals for this hospitalization and ongoing recovery are:: SNF   Choice offered to / list presented to : Patient      Discharge Placement              Patient chooses bed at: [facility] Patient to be transferred to facility by: [facility] Name of family member notified: [name]; Daughter; [phone] Patient and family notified of of transfer: 07/01/23  Discharge Plan and Services Additional resources added to the After Visit Summary for   In-house Referral: Clinical Social Work                                   Social Drivers of Health (SDOH) Interventions SDOH Screenings   Food Insecurity: No Food Insecurity (06/26/2023)  Housing: Low Risk  (06/26/2023)  Transportation Needs: No Transportation Needs (06/26/2023)  Utilities: Not At Risk (06/26/2023)  Social Connections: Moderately Isolated (06/26/2023)  Tobacco Use: Medium Risk (06/25/2023)     Readmission Risk Interventions    07/24/2022    2:49 PM   Readmission Risk Prevention Plan  Transportation Screening Complete  PCP or Specialist Appt within 5-7 Days Complete  Home Care Screening Complete  Medication Review (RN CM) Complete

## 2023-07-01 NOTE — Progress Notes (Signed)
 Report given to Charbina, Charity fundraiser at [facility].   Loyce Blazing, RN

## 2023-07-01 NOTE — Plan of Care (Signed)
  Problem: Education: Goal: Knowledge of General Education information will improve Description: Including pain rating scale, medication(s)/side effects and non-pharmacologic comfort measures Outcome: Adequate for Discharge   Problem: Health Behavior/Discharge Planning: Goal: Ability to manage health-related needs will improve Outcome: Adequate for Discharge   Problem: Clinical Measurements: Goal: Ability to maintain clinical measurements within normal limits will improve Outcome: Adequate for Discharge Goal: Will remain free from infection Outcome: Adequate for Discharge Goal: Diagnostic test results will improve Outcome: Adequate for Discharge Goal: Respiratory complications will improve Outcome: Adequate for Discharge Goal: Cardiovascular complication will be avoided Outcome: Adequate for Discharge   Problem: Activity: Goal: Risk for activity intolerance will decrease Outcome: Adequate for Discharge   Problem: Nutrition: Goal: Adequate nutrition will be maintained Outcome: Adequate for Discharge   Problem: Coping: Goal: Level of anxiety will decrease Outcome: Adequate for Discharge   Problem: Elimination: Goal: Will not experience complications related to bowel motility Outcome: Adequate for Discharge Goal: Will not experience complications related to urinary retention Outcome: Adequate for Discharge   Problem: Pain Managment: Goal: General experience of comfort will improve and/or be controlled Outcome: Adequate for Discharge   Problem: Safety: Goal: Ability to remain free from injury will improve Outcome: Adequate for Discharge   Problem: Skin Integrity: Goal: Risk for impaired skin integrity will decrease Outcome: Adequate for Discharge   Problem: Acute Rehab PT Goals(only PT should resolve) Goal: Patient Will Transfer Sit To/From Stand Outcome: Adequate for Discharge Goal: Pt Will Transfer Bed To Chair/Chair To Bed Outcome: Adequate for Discharge Goal:  Pt Will Ambulate Outcome: Adequate for Discharge

## 2023-07-23 ENCOUNTER — Emergency Department (HOSPITAL_COMMUNITY)

## 2023-07-23 ENCOUNTER — Other Ambulatory Visit: Payer: Self-pay

## 2023-07-23 ENCOUNTER — Inpatient Hospital Stay (HOSPITAL_COMMUNITY)
Admission: EM | Admit: 2023-07-23 | Discharge: 2023-07-30 | DRG: 291 | Disposition: A | Discharge status: Skilled Nursing Facility | Source: Skilled Nursing Facility | Attending: Internal Medicine | Admitting: Internal Medicine

## 2023-07-23 ENCOUNTER — Encounter (HOSPITAL_COMMUNITY): Payer: Self-pay

## 2023-07-23 DIAGNOSIS — K219 Gastro-esophageal reflux disease without esophagitis: Secondary | ICD-10-CM | POA: Diagnosis present

## 2023-07-23 DIAGNOSIS — Z8679 Personal history of other diseases of the circulatory system: Secondary | ICD-10-CM

## 2023-07-23 DIAGNOSIS — I959 Hypotension, unspecified: Secondary | ICD-10-CM | POA: Diagnosis present

## 2023-07-23 DIAGNOSIS — I5043 Acute on chronic combined systolic (congestive) and diastolic (congestive) heart failure: Secondary | ICD-10-CM | POA: Diagnosis present

## 2023-07-23 DIAGNOSIS — J9611 Chronic respiratory failure with hypoxia: Secondary | ICD-10-CM | POA: Diagnosis present

## 2023-07-23 DIAGNOSIS — G4733 Obstructive sleep apnea (adult) (pediatric): Secondary | ICD-10-CM | POA: Diagnosis present

## 2023-07-23 DIAGNOSIS — I714 Abdominal aortic aneurysm, without rupture, unspecified: Secondary | ICD-10-CM | POA: Diagnosis present

## 2023-07-23 DIAGNOSIS — Z823 Family history of stroke: Secondary | ICD-10-CM

## 2023-07-23 DIAGNOSIS — Z515 Encounter for palliative care: Secondary | ICD-10-CM | POA: Diagnosis not present

## 2023-07-23 DIAGNOSIS — I340 Nonrheumatic mitral (valve) insufficiency: Secondary | ICD-10-CM | POA: Diagnosis present

## 2023-07-23 DIAGNOSIS — Z7189 Other specified counseling: Secondary | ICD-10-CM | POA: Diagnosis not present

## 2023-07-23 DIAGNOSIS — Z950 Presence of cardiac pacemaker: Secondary | ICD-10-CM

## 2023-07-23 DIAGNOSIS — L89150 Pressure ulcer of sacral region, unstageable: Secondary | ICD-10-CM | POA: Diagnosis present

## 2023-07-23 DIAGNOSIS — J449 Chronic obstructive pulmonary disease, unspecified: Secondary | ICD-10-CM | POA: Diagnosis present

## 2023-07-23 DIAGNOSIS — Z1152 Encounter for screening for COVID-19: Secondary | ICD-10-CM | POA: Diagnosis not present

## 2023-07-23 DIAGNOSIS — I5082 Biventricular heart failure: Secondary | ICD-10-CM | POA: Diagnosis present

## 2023-07-23 DIAGNOSIS — Z801 Family history of malignant neoplasm of trachea, bronchus and lung: Secondary | ICD-10-CM

## 2023-07-23 DIAGNOSIS — Z7951 Long term (current) use of inhaled steroids: Secondary | ICD-10-CM | POA: Diagnosis not present

## 2023-07-23 DIAGNOSIS — Z66 Do not resuscitate: Secondary | ICD-10-CM | POA: Diagnosis present

## 2023-07-23 DIAGNOSIS — Z7982 Long term (current) use of aspirin: Secondary | ICD-10-CM

## 2023-07-23 DIAGNOSIS — Z86711 Personal history of pulmonary embolism: Secondary | ICD-10-CM | POA: Diagnosis not present

## 2023-07-23 DIAGNOSIS — J439 Emphysema, unspecified: Secondary | ICD-10-CM

## 2023-07-23 DIAGNOSIS — I509 Heart failure, unspecified: Secondary | ICD-10-CM | POA: Diagnosis present

## 2023-07-23 DIAGNOSIS — E662 Morbid (severe) obesity with alveolar hypoventilation: Secondary | ICD-10-CM | POA: Diagnosis present

## 2023-07-23 DIAGNOSIS — N1831 Chronic kidney disease, stage 3a: Secondary | ICD-10-CM | POA: Diagnosis not present

## 2023-07-23 DIAGNOSIS — Z79899 Other long term (current) drug therapy: Secondary | ICD-10-CM

## 2023-07-23 DIAGNOSIS — Z6831 Body mass index (BMI) 31.0-31.9, adult: Secondary | ICD-10-CM

## 2023-07-23 DIAGNOSIS — Z86718 Personal history of other venous thrombosis and embolism: Secondary | ICD-10-CM | POA: Diagnosis not present

## 2023-07-23 DIAGNOSIS — I2729 Other secondary pulmonary hypertension: Secondary | ICD-10-CM | POA: Diagnosis present

## 2023-07-23 DIAGNOSIS — N189 Chronic kidney disease, unspecified: Secondary | ICD-10-CM | POA: Diagnosis present

## 2023-07-23 DIAGNOSIS — R54 Age-related physical debility: Secondary | ICD-10-CM | POA: Diagnosis present

## 2023-07-23 DIAGNOSIS — N1832 Chronic kidney disease, stage 3b: Secondary | ICD-10-CM | POA: Diagnosis present

## 2023-07-23 DIAGNOSIS — J9621 Acute and chronic respiratory failure with hypoxia: Secondary | ICD-10-CM | POA: Diagnosis present

## 2023-07-23 DIAGNOSIS — K5090 Crohn's disease, unspecified, without complications: Secondary | ICD-10-CM | POA: Diagnosis present

## 2023-07-23 DIAGNOSIS — I130 Hypertensive heart and chronic kidney disease with heart failure and stage 1 through stage 4 chronic kidney disease, or unspecified chronic kidney disease: Principal | ICD-10-CM | POA: Diagnosis present

## 2023-07-23 DIAGNOSIS — Z955 Presence of coronary angioplasty implant and graft: Secondary | ICD-10-CM

## 2023-07-23 DIAGNOSIS — R0902 Hypoxemia: Secondary | ICD-10-CM | POA: Diagnosis not present

## 2023-07-23 DIAGNOSIS — Z888 Allergy status to other drugs, medicaments and biological substances status: Secondary | ICD-10-CM

## 2023-07-23 DIAGNOSIS — Z87891 Personal history of nicotine dependence: Secondary | ICD-10-CM

## 2023-07-23 DIAGNOSIS — L98429 Non-pressure chronic ulcer of back with unspecified severity: Secondary | ICD-10-CM | POA: Diagnosis present

## 2023-07-23 DIAGNOSIS — Z95828 Presence of other vascular implants and grafts: Secondary | ICD-10-CM | POA: Diagnosis not present

## 2023-07-23 DIAGNOSIS — Z886 Allergy status to analgesic agent status: Secondary | ICD-10-CM

## 2023-07-23 LAB — CBC WITH DIFFERENTIAL/PLATELET
Abs Immature Granulocytes: 0.04 K/uL (ref 0.00–0.07)
Basophils Absolute: 0 K/uL (ref 0.0–0.1)
Basophils Relative: 0 %
Eosinophils Absolute: 0.1 K/uL (ref 0.0–0.5)
Eosinophils Relative: 1 %
HCT: 33.2 % — ABNORMAL LOW (ref 36.0–46.0)
Hemoglobin: 10.4 g/dL — ABNORMAL LOW (ref 12.0–15.0)
Immature Granulocytes: 1 %
Lymphocytes Relative: 10 %
Lymphs Abs: 0.6 K/uL — ABNORMAL LOW (ref 0.7–4.0)
MCH: 31 pg (ref 26.0–34.0)
MCHC: 31.3 g/dL (ref 30.0–36.0)
MCV: 98.8 fL (ref 80.0–100.0)
Monocytes Absolute: 0.5 K/uL (ref 0.1–1.0)
Monocytes Relative: 8 %
Neutro Abs: 5.1 K/uL (ref 1.7–7.7)
Neutrophils Relative %: 80 %
Platelets: 232 K/uL (ref 150–400)
RBC: 3.36 MIL/uL — ABNORMAL LOW (ref 3.87–5.11)
RDW: 18.5 % — ABNORMAL HIGH (ref 11.5–15.5)
WBC: 6.3 K/uL (ref 4.0–10.5)
nRBC: 0 % (ref 0.0–0.2)

## 2023-07-23 LAB — COMPREHENSIVE METABOLIC PANEL WITH GFR
ALT: 28 U/L (ref 0–44)
AST: 30 U/L (ref 15–41)
Albumin: 2.1 g/dL — ABNORMAL LOW (ref 3.5–5.0)
Alkaline Phosphatase: 74 U/L (ref 38–126)
Anion gap: 10 (ref 5–15)
BUN: 31 mg/dL — ABNORMAL HIGH (ref 8–23)
CO2: 32 mmol/L (ref 22–32)
Calcium: 7.8 mg/dL — ABNORMAL LOW (ref 8.9–10.3)
Chloride: 92 mmol/L — ABNORMAL LOW (ref 98–111)
Creatinine, Ser: 1.55 mg/dL — ABNORMAL HIGH (ref 0.44–1.00)
GFR, Estimated: 31 mL/min — ABNORMAL LOW (ref >60)
Glucose, Bld: 107 mg/dL — ABNORMAL HIGH (ref 70–99)
Potassium: 4 mmol/L (ref 3.5–5.1)
Sodium: 134 mmol/L — ABNORMAL LOW (ref 135–145)
Total Bilirubin: 0.9 mg/dL (ref 0.0–1.2)
Total Protein: 5.5 g/dL — ABNORMAL LOW (ref 6.5–8.1)

## 2023-07-23 LAB — I-STAT CHEM 8, ED
BUN: 37 mg/dL — ABNORMAL HIGH (ref 8–23)
Calcium, Ion: 1 mmol/L — ABNORMAL LOW (ref 1.15–1.40)
Chloride: 94 mmol/L — ABNORMAL LOW (ref 98–111)
Creatinine, Ser: 1.6 mg/dL — ABNORMAL HIGH (ref 0.44–1.00)
Glucose, Bld: 93 mg/dL (ref 70–99)
HCT: 31 % — ABNORMAL LOW (ref 36.0–46.0)
Hemoglobin: 10.5 g/dL — ABNORMAL LOW (ref 12.0–15.0)
Potassium: 4.4 mmol/L (ref 3.5–5.1)
Sodium: 133 mmol/L — ABNORMAL LOW (ref 135–145)
TCO2: 35 mmol/L — ABNORMAL HIGH (ref 22–32)

## 2023-07-23 LAB — RESP PANEL BY RT-PCR (RSV, FLU A&B, COVID)  RVPGX2
Influenza A by PCR: NEGATIVE
Influenza B by PCR: NEGATIVE
Resp Syncytial Virus by PCR: NEGATIVE
SARS Coronavirus 2 by RT PCR: NEGATIVE

## 2023-07-23 LAB — I-STAT VENOUS BLOOD GAS, ED
Acid-Base Excess: 10 mmol/L — ABNORMAL HIGH (ref 0.0–2.0)
Bicarbonate: 36.3 mmol/L — ABNORMAL HIGH (ref 20.0–28.0)
Calcium, Ion: 1.01 mmol/L — ABNORMAL LOW (ref 1.15–1.40)
HCT: 31 % — ABNORMAL LOW (ref 36.0–46.0)
Hemoglobin: 10.5 g/dL — ABNORMAL LOW (ref 12.0–15.0)
O2 Saturation: 98 %
Potassium: 4.3 mmol/L (ref 3.5–5.1)
Sodium: 134 mmol/L — ABNORMAL LOW (ref 135–145)
TCO2: 38 mmol/L — ABNORMAL HIGH (ref 22–32)
pCO2, Ven: 59.4 mmHg (ref 44–60)
pH, Ven: 7.394 (ref 7.25–7.43)
pO2, Ven: 114 mmHg — ABNORMAL HIGH (ref 32–45)

## 2023-07-23 LAB — CBG MONITORING, ED: Glucose-Capillary: 86 mg/dL (ref 70–99)

## 2023-07-23 LAB — BRAIN NATRIURETIC PEPTIDE: B Natriuretic Peptide: 1806.7 pg/mL — ABNORMAL HIGH (ref 0.0–100.0)

## 2023-07-23 MED ORDER — ATORVASTATIN CALCIUM 40 MG PO TABS
40.0000 mg | ORAL_TABLET | Freq: Every day | ORAL | Status: DC
  Administered 2023-07-23 – 2023-07-29 (×7): 40 mg via ORAL
  Filled 2023-07-23 (×7): qty 1

## 2023-07-23 MED ORDER — GUAIFENESIN 100 MG/5ML PO LIQD
10.0000 mL | Freq: Three times a day (TID) | ORAL | Status: DC
Start: 2023-07-23 — End: 2023-07-30
  Administered 2023-07-27: 10 mL via ORAL
  Filled 2023-07-23 (×8): qty 10

## 2023-07-23 MED ORDER — IPRATROPIUM-ALBUTEROL 0.5-2.5 (3) MG/3ML IN SOLN
3.0000 mL | Freq: Once | RESPIRATORY_TRACT | Status: AC
  Administered 2023-07-23: 3 mL via RESPIRATORY_TRACT
  Filled 2023-07-23: qty 3

## 2023-07-23 MED ORDER — SENNOSIDES-DOCUSATE SODIUM 8.6-50 MG PO TABS: 1.0000 | ORAL_TABLET | Freq: Every evening | ORAL | Status: DC | PRN

## 2023-07-23 MED ORDER — SACCHAROMYCES BOULARDII 250 MG PO CAPS
250.0000 mg | ORAL_CAPSULE | Freq: Two times a day (BID) | ORAL | Status: DC
  Administered 2023-07-23 – 2023-07-30 (×14): 250 mg via ORAL
  Filled 2023-07-23 (×14): qty 1

## 2023-07-23 MED ORDER — FUROSEMIDE 10 MG/ML IJ SOLN
40.0000 mg | Freq: Once | INTRAMUSCULAR | Status: AC
  Administered 2023-07-23: 40 mg via INTRAVENOUS
  Filled 2023-07-23: qty 4

## 2023-07-23 MED ORDER — ONDANSETRON HCL 4 MG/2ML IJ SOLN
4.0000 mg | Freq: Four times a day (QID) | INTRAMUSCULAR | Status: DC | PRN
  Administered 2023-07-25: 4 mg via INTRAVENOUS
  Filled 2023-07-23: qty 2

## 2023-07-23 MED ORDER — ALBUTEROL SULFATE (2.5 MG/3ML) 0.083% IN NEBU: 2.5000 mg | INHALATION_SOLUTION | RESPIRATORY_TRACT | Status: DC | PRN

## 2023-07-23 MED ORDER — PANTOPRAZOLE SODIUM 40 MG PO TBEC
40.0000 mg | DELAYED_RELEASE_TABLET | Freq: Two times a day (BID) | ORAL | Status: DC
  Administered 2023-07-23 – 2023-07-30 (×14): 40 mg via ORAL
  Filled 2023-07-23 (×13): qty 1

## 2023-07-23 MED ORDER — DOCUSATE SODIUM 100 MG PO CAPS
200.0000 mg | ORAL_CAPSULE | Freq: Every morning | ORAL | Status: DC
  Administered 2023-07-24 – 2023-07-30 (×7): 200 mg via ORAL
  Filled 2023-07-23 (×7): qty 2

## 2023-07-23 MED ORDER — ACETAMINOPHEN 500 MG PO TABS
500.0000 mg | ORAL_TABLET | Freq: Four times a day (QID) | ORAL | Status: DC | PRN
Start: 2023-07-23 — End: 2023-07-26
  Administered 2023-07-24 – 2023-07-26 (×5): 500 mg via ORAL
  Filled 2023-07-23 (×8): qty 1

## 2023-07-23 MED ORDER — MESALAMINE ER 250 MG PO CPCR
1000.0000 mg | ORAL_CAPSULE | Freq: Two times a day (BID) | ORAL | Status: DC
  Administered 2023-07-23 – 2023-07-30 (×14): 1000 mg via ORAL
  Filled 2023-07-23 (×15): qty 4

## 2023-07-23 MED ORDER — MELATONIN 3 MG PO TABS
9.0000 mg | ORAL_TABLET | Freq: Every day | ORAL | Status: DC
  Administered 2023-07-23 – 2023-07-29 (×7): 9 mg via ORAL
  Filled 2023-07-23 (×7): qty 3

## 2023-07-23 MED ORDER — ASPIRIN 81 MG PO TBEC
81.0000 mg | DELAYED_RELEASE_TABLET | Freq: Every morning | ORAL | Status: DC
  Administered 2023-07-24 – 2023-07-30 (×7): 81 mg via ORAL
  Filled 2023-07-23 (×7): qty 1

## 2023-07-23 MED ORDER — LORATADINE 10 MG PO TABS
10.0000 mg | ORAL_TABLET | Freq: Every day | ORAL | Status: DC
  Administered 2023-07-24 – 2023-07-30 (×7): 10 mg via ORAL
  Filled 2023-07-23 (×6): qty 1

## 2023-07-23 MED ORDER — ONDANSETRON HCL 4 MG PO TABS: 4.0000 mg | ORAL_TABLET | Freq: Four times a day (QID) | ORAL | Status: DC | PRN

## 2023-07-23 MED ORDER — POLYETHYLENE GLYCOL 3350 17 G PO PACK
17.0000 g | PACK | Freq: Every day | ORAL | Status: DC | PRN
  Administered 2023-07-25 – 2023-07-29 (×3): 17 g via ORAL
  Filled 2023-07-23 (×3): qty 1

## 2023-07-23 MED ORDER — ACETAMINOPHEN 650 MG RE SUPP
650.0000 mg | Freq: Four times a day (QID) | RECTAL | Status: DC | PRN
  Filled 2023-07-23: qty 1

## 2023-07-23 MED ORDER — FUROSEMIDE 10 MG/ML IJ SOLN
40.0000 mg | Freq: Every day | INTRAMUSCULAR | Status: DC
  Administered 2023-07-24: 40 mg via INTRAVENOUS
  Filled 2023-07-23: qty 4

## 2023-07-23 NOTE — ED Triage Notes (Addendum)
 Pt bib [facility] coming from [facility] for cc of shortness of breath. Pt reportedly altered and not acting herself at the facility. EMS states patient normally wears 5L and staff noticed patient was hooked up to O2 but it machine was not working properly and patient not receiving O2. 71% on NRB with [employer]. EMS reports rales all throughout. Pt denies chest pain/shortness of breath during triage. GCS 15.   EMS: 102/74 106 HR Simple mask 6L @97 % 150 cbg Etco2 29 24 RR

## 2023-07-23 NOTE — ED Notes (Addendum)
 PT placed on nasal canula at 6L and O2 is stating at 97%

## 2023-07-23 NOTE — H&P (Signed)
 History and Physical    Patient: Felicia Benson FMW:[id number] DOB: 1930/02/15 DOA: 07/23/2023 DOS: the patient was seen and examined on 07/23/2023 PCP: Patient, No Pcp Per  Patient coming from: SNF  Chief Complaint:  Chief Complaint  Patient presents with   Shortness of Breath   HPI: Felicia Benson is a 89 y.o. 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  Today at her nursing home the patient had an episode where she was less responsive.  Vitals were done and her O2 sats were in the 70s at that time.  EMS was called but it appeared that there was some problem with her concentrator.  Once they switched something out they were able to make sure her oxygen was flowing and her O2 sats came back up.  The patient's mental status returned with that.  Because her O2 sats were so low even that was temporary they brought her in for evaluation.  The patient is an excellent historian.  She denies any shortness of breath either today or anytime recently.  She says she participates with physical therapy and walks every day.  She does not generally have trouble with shortness of breath but she does have trouble with a chronic cough which has been going on for some time.  She was last discharged from the hospital about a month ago.  She was treated for a COPD exacerbation but even after her steroids and nebs her cough did not improve.  The time before that she was hospitalized about 3 months ago.  At that time she was treated for congestive heart failure evaluation but even after diuresis at that time her cough did not improve.  The patient says she has come to terms with the fact that she may have the cough forever, but she does not like it.  She is happy did not have shortness of breath or chest pain. She says she is weighed  every other day and her last weight was 174 pounds which is less than it has been in a long time she says.  Usually her weight is in the 180s.    Review of Systems: As mentioned in the history of present illness. All other systems reviewed and are negative. Past Medical History:  Diagnosis Date   AAA (abdominal aortic aneurysm) (HCC)    CHF (congestive heart failure) (HCC)    Crohn's disease (HCC)    Diverticulitis    Diverticulosis    GERD (gastroesophageal reflux disease)    GI bleed    Hypertension    OSA (obstructive sleep apnea)    Pacemaker    Thyroid nodule    Past Surgical History:  Procedure Laterality Date   CHOLECYSTECTOMY     COLONOSCOPY WITH PROPOFOL  N/A 06/01/2015   Procedure: COLONOSCOPY WITH PROPOFOL ;  Surgeon: Jerrell Sol, MD;  Location: [facility] ENDOSCOPY;  Service: Endoscopy;  Laterality: N/A;   CORONARY ANGIOPLASTY WITH STENT PLACEMENT     ESOPHAGOGASTRODUODENOSCOPY (EGD) WITH PROPOFOL  N/A 06/01/2015   Procedure: ESOPHAGOGASTRODUODENOSCOPY (EGD) WITH PROPOFOL ;  Surgeon: Jerrell Sol, MD;  Location: [facility] ENDOSCOPY;  Service: Endoscopy;  Laterality: N/A;   GIVENS CAPSULE STUDY N/A 06/03/2015   Procedure: GIVENS CAPSULE STUDY;  Surgeon: Jerrell Sol, MD;  Location: [facility] ENDOSCOPY;  Service: Endoscopy;  Laterality: N/A;   INTRAMEDULLARY (IM) NAIL INTERTROCHANTERIC Right 10/30/2022  Procedure: INTRAMEDULLARY nailing of right femur;  Surgeon: Edna Toribio LABOR, MD;  Location: [facility] OR;  Service: Orthopedics;  Laterality: Right;   IR IVC FILTER PLMT / S&I /IMG GUID/MOD SED  02/11/2023   PACEMAKER PLACEMENT  2015   Social History:  reports that she has quit smoking. She has never used smokeless tobacco. She reports that she does not drink alcohol  and does not use drugs.  Allergies  Allergen Reactions   Motrin [Ibuprofen] Other (See Comments)    GI bleed   Ace Inhibitors Cough   Breztri  Aerosphere [Budeson-Glycopyrrol-Formoterol ] Hypertension   Cipro  [Ciprofloxacin Hcl] Other (See Comments)    Avoid fluoroquinolones due to asc-aortic aneurysm   Levaquin [Levofloxacin] Other (See Comments)    Avoid fluoroquinolones due to asc-aortic aneurysm   Nsaids Nausea And Vomiting and Other (See Comments)    Hx of GI bleed    Family History  Problem Relation Age of Onset   CVA Mother    Lung cancer Father    Colon cancer Neg Hx     Prior to Admission medications   Medication Sig Start Date End Date Taking? Authorizing Provider  acetaminophen  (TYLENOL ) 500 MG tablet Take 1,000 mg by mouth at bedtime. Give with melatonin   Yes [provider]  albuterol  (VENTOLIN  HFA) 108 (90 Base) MCG/ACT inhaler Inhale 2 puffs into the lungs every 6 (six) hours as needed for wheezing or shortness of breath. 07/24/22  Yes Ricky Fines, MD  aspirin  EC 81 MG tablet Take 1 tablet (81 mg total) by mouth daily. Swallow whole. Patient taking differently: Take 81 mg by mouth in the morning. Swallow whole. 03/29/21  Yes Rosemarie Eather RAMAN, MD  atorvastatin  (LIPITOR) 40 MG tablet Take 40 mg by mouth at bedtime.   Yes [provider]  cetirizine (ZYRTEC) 10 MG tablet Take 10 mg by mouth in the morning.   Yes [provider]  cholecalciferol  (VITAMIN D3) 25 MCG (1000 UNIT) tablet Take 1,000 Units by mouth in the morning.   Yes [provider]  dextromethorphan  (DELSYM ) 30 MG/5ML liquid Take 10 mLs by mouth 2 (two) times daily as needed for cough.   Yes [provider]  diphenhydramine -acetaminophen  (TYLENOL  PM) 25-500 MG TABS tablet Take 1 tablet by mouth at bedtime as needed.   Yes [provider]  docusate sodium  (COLACE) 100 MG capsule Take 200 mg by mouth in the morning.   Yes [provider]  guaiFENesin  (ROBITUSSIN) 100 MG/5ML liquid Take 10 mLs by mouth 3 (three) times daily.   Yes [provider]  lidocaine  (ASPERCREME LIDOCAINE ) 4 % Place 1 patch onto the skin daily as needed (knee pain).   Yes  [provider]  liver oil-zinc  oxide (DESITIN) 40 % ointment Apply topically 2 (two) times daily. 07/01/23  Yes Samtani, Jai-Gurmukh, MD  melatonin 3 MG TABS tablet Take 9 mg by mouth at bedtime.   Yes [provider]  mometasone -formoterol  (DULERA ) 100-5 MCG/ACT AERO Inhale 2 puffs into the lungs 2 (two) times daily.   Yes [provider]  montelukast  (SINGULAIR ) 10 MG tablet Take 1 tablet (10 mg total) by mouth daily. Patient taking differently: Take 10 mg by mouth in the morning. 08/01/21  Yes [name], Adewale A, MD  ondansetron  (ZOFRAN ) 4 MG tablet Take 4 mg by mouth every 6 (six) hours as needed for nausea or vomiting.   Yes [provider]  OXYGEN Inhale 4 L into the lungs continuous.   Yes [provider]  pantoprazole  (PROTONIX ) 40 MG tablet Take 40 mg by mouth in the morning and at bedtime.   Yes [provider]  PENTASA  500 MG CR capsule Take 1,000 mg by mouth in the morning and at bedtime.   Yes [provider]  polyethylene glycol (MIRALAX  / GLYCOLAX ) 17 g packet Take 17 g by mouth 2 (two) times daily as needed for mild constipation or moderate constipation.   Yes [provider]  saccharomyces boulardii (FLORASTOR) 250 MG capsule Take 250 mg by mouth 2 (two) times daily.   Yes [provider]  torsemide  (DEMADEX ) 20 MG tablet Take 40 mg by mouth every other day.   Yes [provider]    Physical Exam: Vitals:   07/23/23 1635 07/23/23 1644 07/23/23 2000  BP: 102/69  109/80  Pulse: (!) 105  100  Resp: (!) 21  20  Temp: 98.1 F (36.7 C)  98.3 F (36.8 C)  TempSrc: Oral  Oral  SpO2: 100%  95%  Weight:  82.9 kg   Height:  5' 6 (1.676 m)    Physical Exam:  General: No acute distress, well developed, well nourished, eating at the time of my visit HEENT: Normocephalic, atraumatic, PERRL Cardiovascular: Normal rate and rhythm. Gallop, SM, Distal pulses intact. JVD to her ear Pulmonary:  Normal pulmonary effort, rales in the right base Gastrointestinal: Nondistended abdomen, soft, non-tender, normoactive bowel sounds Musculoskeletal:Normal ROM, no lower ext edema Skin: Skin is warm and dry. Neuro: No focal deficits noted, AAOx3. PSYCH: Attentive and cooperative  Data Reviewed:  Results for orders placed or performed during the hospital encounter of 07/23/23 (from the past 24 hours)  CBC with Differential     Status: Abnormal   Collection Time: 07/23/23  6:12 PM  Result Value Ref Range   WBC 6.3 4.0 - 10.5 K/uL   RBC 3.36 (L) 3.87 - 5.11 MIL/uL   Hemoglobin 10.4 (L) 12.0 - 15.0 g/dL   HCT 66.7 (L) 63.9 - 53.9 %   MCV 98.8 80.0 - 100.0 fL   MCH 31.0 26.0 - 34.0 pg   MCHC 31.3 30.0 - 36.0 g/dL   RDW 81.4 (H) 88.4 - 84.4 %   Platelets 232 150 - 400 K/uL   nRBC 0.0 0.0 - 0.2 %   Neutrophils Relative % 80 %   Neutro Abs 5.1 1.7 - 7.7 K/uL   Lymphocytes Relative 10 %   Lymphs Abs 0.6 (L) 0.7 - 4.0 K/uL   Monocytes Relative 8 %   Monocytes Absolute 0.5 0.1 - 1.0 K/uL   Eosinophils Relative 1 %   Eosinophils Absolute 0.1 0.0 - 0.5 K/uL   Basophils Relative 0 %   Basophils Absolute 0.0 0.0 - 0.1 K/uL   Immature Granulocytes 1 %   Abs Immature Granulocytes 0.04 0.00 - 0.07 K/uL  Brain natriuretic peptide     Status: Abnormal   Collection Time: 07/23/23  6:12 PM  Result Value Ref Range   B Natriuretic Peptide 1,806.7 (H) 0.0 - 100.0 pg/mL  Comprehensive metabolic panel     Status: Abnormal   Collection Time: 07/23/23  6:12 PM  Result Value Ref Range   Sodium 134 (L) 135 - 145 mmol/L   Potassium 4.0 3.5 - 5.1 mmol/L   Chloride 92 (L) 98 - 111 mmol/L   CO2 32 22 - 32 mmol/L   Glucose, Bld 107 (H) 70 - 99 mg/dL   BUN 31 (H) 8 - 23 mg/dL   Creatinine, Ser  1.55 (H) 0.44 - 1.00 mg/dL   Calcium  7.8 (L) 8.9 - 10.3 mg/dL   Total Protein 5.5 (L) 6.5 - 8.1 g/dL   Albumin 2.1 (L) 3.5 - 5.0 g/dL   AST 30 15 - 41 U/L   ALT 28 0 - 44 U/L   Alkaline Phosphatase 74 38 - 126  U/L   Total Bilirubin 0.9 0.0 - 1.2 mg/dL   GFR, Estimated 31 (L) >60 mL/min   Anion gap 10 5 - 15  Resp panel by RT-PCR (RSV, Flu A&B, Covid) Anterior Nasal Swab     Status: None   Collection Time: 07/23/23  7:35 PM   Specimen: Anterior Nasal Swab  Result Value Ref Range   SARS Coronavirus 2 by RT PCR NEGATIVE NEGATIVE   Influenza A by PCR NEGATIVE NEGATIVE   Influenza B by PCR NEGATIVE NEGATIVE   Resp Syncytial Virus by PCR NEGATIVE NEGATIVE  POC CBG, ED     Status: None   Collection Time: 07/23/23  7:46 PM  Result Value Ref Range   Glucose-Capillary 86 70 - 99 mg/dL  I-Stat venous blood gas, ([facility] ED, [facility], [facility])     Status: Abnormal   Collection Time: 07/23/23  8:07 PM  Result Value Ref Range   pH, Ven 7.394 7.25 - 7.43   pCO2, Ven 59.4 44 - 60 mmHg   pO2, Ven 114 (H) 32 - 45 mmHg   Bicarbonate 36.3 (H) 20.0 - 28.0 mmol/L   TCO2 38 (H) 22 - 32 mmol/L   O2 Saturation 98 %   Acid-Base Excess 10.0 (H) 0.0 - 2.0 mmol/L   Sodium 134 (L) 135 - 145 mmol/L   Potassium 4.3 3.5 - 5.1 mmol/L   Calcium , Ion 1.01 (L) 1.15 - 1.40 mmol/L   HCT 31.0 (L) 36.0 - 46.0 %   Hemoglobin 10.5 (L) 12.0 - 15.0 g/dL   Sample type VENOUS   I-stat chem 8, ED (not at [facility], [facility] or [facility])     Status: Abnormal   Collection Time: 07/23/23  8:07 PM  Result Value Ref Range   Sodium 133 (L) 135 - 145 mmol/L   Potassium 4.4 3.5 - 5.1 mmol/L   Chloride 94 (L) 98 - 111 mmol/L   BUN 37 (H) 8 - 23 mg/dL   Creatinine, Ser 8.39 (H) 0.44 - 1.00 mg/dL   Glucose, Bld 93 70 - 99 mg/dL   Calcium , Ion 1.00 (L) 1.15 - 1.40 mmol/L   TCO2 35 (H) 22 - 32 mmol/L   Hemoglobin 10.5 (L) 12.0 - 15.0 g/dL   HCT 68.9 (L) 63.9 - 53.9 %     Assessment and Plan: CHF exacerbation  2. Chronic cough 3. OSA -by the patient's last echo 5 months ago her EF was normal at 55%.  Her valves were normal without any significant regurgitation or stenosis.  Clinically she has signs of decompensated heart failure including Rales, JVD, and an  S3.  Perhaps her hypoxic event caused this decompensation, but its hard to believe that her echo is wnl.   - Consider repeat echo - Diurese - Monitor - Continue routine medications - Cpap nightly - Continue baseline 4-5L [place]   Advance Care Planning:   Code Status: Prior  The patient wants to be DNR.  She has paperwork located in the chart.  She names her daughter as her surrogate Management consultant.  Consults: none  Family Communication: None  Severity of Illness: The appropriate patient status for this patient is INPATIENT.  Inpatient status is judged to be reasonable and necessary in order to provide the required intensity of service to ensure the patient's safety. The patient's presenting symptoms, physical exam findings, and initial radiographic and laboratory data in the context of their chronic comorbidities is felt to place them at high risk for further clinical deterioration. Furthermore, it is not anticipated that the patient will be medically stable for discharge from the hospital within 2 midnights of admission.   * I certify that at the point of admission it is my clinical judgment that the patient will require inpatient hospital care spanning beyond 2 midnights from the point of admission due to high intensity of service, high risk for further deterioration and high frequency of surveillance required.*  Author: ARTHEA CHILD, MD 07/23/2023 10:19 PM  For on call review [email or web address].

## 2023-07-23 NOTE — ED Provider Notes (Signed)
 [facility] Provider Note   CSN: [id number] Arrival date & time: 07/23/23  1623     History  Chief Complaint  Patient presents with   Shortness of Breath    Felicia Benson is a 89 y.o. 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 Pt reportedly altered and not acting herself at the facility. EMS states patient normally wears 5L and staff noticed patient was not wearing O2. 71% on NRB with [facility] staff. EMS reports rales all throughout. Now patient is GCS is 15.   She reports that this morning her oxygen levels were low because her nasal cannula wasn't on and they told her she wasn't acting like herself. She reports feeling woozy this morning. She denies CP or feeling SOB but states she was SOB earlier around lunchtime. Reports doing better since she got out of the hospital except that she hasn't stopped coughing, states there is a lot of gunk in her chest she is trying to get out.Patient was recently mated from 06/25/2023 to 07/01/2023 for heart failure with severe right ventricular failure and preserved EF and COPD/PNA. She endorses intermittent wheezing but denies f/c, leg swelling.   EMS: 102/74 106 HR Simple mask 6L @97 % 150 cbg Etco2 29 24 RR   Past Medical History:  Diagnosis Date   AAA (abdominal aortic aneurysm) (HCC)    CHF (congestive heart failure) (HCC)    Crohn's disease (HCC)    Diverticulitis    Diverticulosis    GERD (gastroesophageal reflux disease)    GI bleed    Hypertension    OSA (obstructive sleep apnea)    Pacemaker    Thyroid nodule        Home Medications Prior to Admission medications   Medication Sig Start Date End Date Taking? Authorizing Provider   acetaminophen  (TYLENOL ) 500 MG tablet Take 1,000 mg by mouth at bedtime. Give with melatonin   Yes [provider]  albuterol  (VENTOLIN  HFA) 108 (90 Base) MCG/ACT inhaler Inhale 2 puffs into the lungs every 6 (six) hours as needed for wheezing or shortness of breath. 07/24/22  Yes Ricky Fines, MD  aspirin  EC 81 MG tablet Take 1 tablet (81 mg total) by mouth daily. Swallow whole. Patient taking differently: Take 81 mg by mouth in the morning. Swallow whole. 03/29/21  Yes Rosemarie Eather RAMAN, MD  atorvastatin  (LIPITOR) 40 MG tablet Take 40 mg by mouth at bedtime.   Yes [provider]  cetirizine (ZYRTEC) 10 MG tablet Take 10 mg by mouth in the morning.   Yes [provider]  cholecalciferol  (VITAMIN D3) 25 MCG (1000 UNIT) tablet Take 1,000 Units by mouth in the morning.   Yes [provider]  dextromethorphan  (DELSYM ) 30 MG/5ML liquid Take 10 mLs by mouth 2 (two) times daily as needed for cough.   Yes [provider]  diphenhydramine -acetaminophen  (TYLENOL  PM) 25-500 MG TABS tablet Take 1 tablet by mouth at bedtime as needed.   Yes [provider]  docusate sodium  (COLACE) 100 MG capsule Take 200 mg by mouth in the morning.   Yes [provider]  guaiFENesin  (ROBITUSSIN) 100 MG/5ML liquid Take 10 mLs by mouth 3 (three) times daily.   Yes [provider]  lidocaine  (ASPERCREME LIDOCAINE ) 4 % Place 1 patch onto the skin daily as needed (knee pain).   Yes [provider]  liver oil-zinc  oxide (DESITIN) 40 % ointment Apply topically 2 (two) times daily. 07/01/23  Yes Samtani, Jai-Gurmukh, MD  melatonin 3 MG TABS tablet Take 9 mg by mouth at bedtime.   Yes [provider]  mometasone -formoterol  (DULERA ) 100-5 MCG/ACT AERO Inhale 2 puffs into the lungs 2 (two) times daily.   Yes [provider]  montelukast  (SINGULAIR ) 10 MG tablet Take 1 tablet (10 mg total) by mouth daily. Patient taking differently: Take 10  mg by mouth in the morning. 08/01/21  Yes [name], Adewale A, MD  ondansetron  (ZOFRAN ) 4 MG tablet Take 4 mg by mouth every 6 (six) hours as needed for nausea or vomiting.   Yes [provider]  OXYGEN Inhale 4 L into the lungs continuous.   Yes [provider]  pantoprazole  (PROTONIX ) 40 MG tablet Take 40 mg by mouth in the morning and at bedtime.   Yes [provider]  PENTASA  500 MG CR capsule Take 1,000 mg by mouth in the morning and at bedtime.   Yes [provider]  polyethylene glycol (MIRALAX  / GLYCOLAX ) 17 g packet Take 17 g by mouth 2 (two) times daily as needed for mild constipation or moderate constipation.   Yes [provider]  saccharomyces boulardii (FLORASTOR) 250 MG capsule Take 250 mg by mouth 2 (two) times daily.   Yes [provider]  torsemide  (DEMADEX ) 20 MG tablet Take 40 mg by mouth every other day.   Yes [provider]      Allergies    Motrin [ibuprofen], Ace inhibitors, Breztri  aerosphere [budeson-glycopyrrol-formoterol ], Cipro [ciprofloxacin hcl], Levaquin [levofloxacin], and Nsaids    Review of Systems   Review of Systems A 10 point review of systems was performed and is negative unless otherwise reported in HPI.  Physical Exam Updated Vital Signs BP 109/80 (BP Location: Left Arm)   Pulse 100   Temp 98.3 F (36.8 C) (Oral)   Resp 20   Ht 5' 6 (1.676 m)   Wt 82.9 kg   SpO2 95%   BMI 29.50 kg/m  Physical Exam General: chronically ill-appearing elderly female, lying in bed.  HEENT: Sclera anicteric, MMM, trachea midline.  Cardiology: RRR, no murmurs/rubs/gallops.  Resp: Mild tachypnea. Bibasilar crackles. Wet cough. Abd: Soft, non-tender, non-distended. No rebound tenderness or guarding.  GU: Deferred. MSK: 2+ pitting edema up to BL thighs. No signs of trauma. Extremities without deformity or TTP. No cyanosis or clubbing. Skin: warm, dry.  Neuro: A&Ox4, CNs II-XII grossly intact. MAEs.   Psych: Normal mood and affect.   ED Results / Procedures / Treatments   Labs (all labs ordered are listed, but only abnormal results are displayed) Labs Reviewed  CBC WITH DIFFERENTIAL/PLATELET - Abnormal; Notable for the following components:      Result Value   RBC 3.36 (*)    Hemoglobin 10.4 (*)    HCT 33.2 (*)    RDW 18.5 (*)    Lymphs Abs 0.6 (*)    All other components within normal limits  BRAIN NATRIURETIC PEPTIDE - Abnormal; Notable for the following components:   B Natriuretic Peptide 1,806.7 (*)    All other components within normal limits  COMPREHENSIVE METABOLIC PANEL WITH GFR - Abnormal; Notable for the following components:   Sodium 134 (*)    Chloride 92 (*)    Glucose, Bld  107 (*)    BUN 31 (*)    Creatinine, Ser 1.55 (*)    Calcium  7.8 (*)    Total Protein 5.5 (*)    Albumin 2.1 (*)    GFR, Estimated 31 (*)    All other components within normal limits  I-STAT VENOUS BLOOD GAS, ED - Abnormal; Notable for the following components:   pO2, Ven 114 (*)    Bicarbonate 36.3 (*)    TCO2 38 (*)    Acid-Base Excess 10.0 (*)    Sodium 134 (*)    Calcium , Ion 1.01 (*)    HCT 31.0 (*)    Hemoglobin 10.5 (*)    All other components within normal limits  I-STAT CHEM 8, ED - Abnormal; Notable for the following components:   Sodium 133 (*)    Chloride 94 (*)    BUN 37 (*)    Creatinine, Ser 1.60 (*)    Calcium , Ion 1.00 (*)    TCO2 35 (*)    Hemoglobin 10.5 (*)    HCT 31.0 (*)    All other components within normal limits  RESP PANEL BY RT-PCR (RSV, FLU A&B, COVID)  [id number]  CULTURE, BLOOD (ROUTINE X 2)  CULTURE, BLOOD (ROUTINE X 2)  BASIC METABOLIC PANEL WITH GFR  CBC  CBG MONITORING, ED    EKG EKG Interpretation Date/Time:  Tuesday July 23 2023 16:34:09 EDT Ventricular Rate:  105 PR Interval:    QRS Duration:  142 QT Interval:  453 QTC Calculation: 599 R Axis:   260  Text Interpretation: paced rhythm Confirmed by Franklyn Gills [phone]) on 07/23/2023  4:47:11 PM  Radiology DG Chest Portable 1 View Result Date: 07/23/2023 CLINICAL DATA:  hypoxia EXAM: PORTABLE CHEST - 1 VIEW COMPARISON:  June 25, 2023, April 07, 2023 FINDINGS: Diffuse bilateral perihilar interstitial opacities. Patchy airspace opacities in both lung bases. Small bilateral pleural effusions. No pneumothorax. Mild cardiomegaly. Left chest pacemaker with leads terminating in the right atrium and right ventricle. Tortuous aorta with aortic atherosclerosis. No acute fracture or destructive lesions. Cervical fusion hardware partially visualized. Multilevel thoracic osteophytosis. Surgical clips in the left neck. IMPRESSION: Cardiomegaly with findings of pulmonary edema and small bilateral pleural effusions. Electronically Signed   By: Rogelia Myers M.D.   On: 07/23/2023 17:08    Procedures Procedures    Medications Ordered in ED Medications  ipratropium-albuterol  (DUONEB) 0.5-2.5 (3) MG/3ML nebulizer solution 3 mL (3 mLs Nebulization Given 07/23/23 2222)  furosemide  (LASIX ) injection 40 mg (40 mg Intravenous Given 07/23/23 2221)    ED Course/ Medical Decision Making/ A&P                          Medical Decision Making Amount and/or Complexity of Data Reviewed Labs: ordered. Decision-making details documented in ED Course. Radiology: ordered. Decision-making details documented in ED Course.  Risk Prescription drug management. Decision regarding hospitalization.    This patient presents to the ED for concern of Sob/hypoxia, this involves an extensive number of treatment options, and is a complaint that carries with it a high risk of complications and morbidity.  I considered the following differential and admission for this acute, potentially life threatening condition.   MDM:    DDX for dyspnea includes but is not limited to:  Patient with recent history of CHF exacerbation admitted to the hospital with diuresis.  Patient with wet cough and crackles/Rales on lung  exam.  Patient ultimately ends up stable on  her 6 L at home.  Unclear if patient was hypoxic because her oxygen was off or if because of her pulmonary edema/pleural effusions associated with heart failure.  She states she has continued to have a wet cough after her hospitalization and believe she would benefit from additional inpatient diuresis.  No pneumonia noted on the chest x-ray.  No chest pain to indicate ACS.  Does have a history of COPD and will give a DuoNeb to see if it helps her.  Clinical Course as of 07/23/23 2342  Tue Jul 23, 2023  1721 DG Chest Portable 1 View Cardiomegaly with findings of pulmonary edema and small bilateral pleural effusions   [HN]  1942 Potassium: 4.0 [HN]    Clinical Course User Index [HN] Franklyn Sid SAILOR, MD    Labs: I Ordered, and personally interpreted labs.  The pertinent results include:  those listed above  Imaging Studies ordered: I ordered imaging studies including CXR I independently visualized and interpreted imaging. I agree with the radiologist interpretation  Additional history obtained from chart review.    Cardiac Monitoring: The patient was maintained on a cardiac monitor.  I personally viewed and interpreted the cardiac monitored which showed an underlying rhythm of: Atrial fibrillation  Reevaluation: After the interventions noted above, I reevaluated the patient and found that they have :stayed the same  Social Determinants of Health: lives at SNF  Disposition: Admit to hospitalist  Co morbidities that complicate the patient evaluation  Past Medical History:  Diagnosis Date   AAA (abdominal aortic aneurysm) (HCC)    CHF (congestive heart failure) (HCC)    Crohn's disease (HCC)    Diverticulitis    Diverticulosis    GERD (gastroesophageal reflux disease)    GI bleed    Hypertension    OSA (obstructive sleep apnea)    Pacemaker    Thyroid nodule      Medicines Meds ordered this encounter  Medications    ipratropium-albuterol  (DUONEB) 0.5-2.5 (3) MG/3ML nebulizer solution 3 mL   furosemide  (LASIX ) injection 40 mg   OR Linked Order Group    acetaminophen  (TYLENOL ) tablet 500 mg    acetaminophen  (TYLENOL ) suppository 650 mg   OR Linked Order Group    ondansetron  (ZOFRAN ) tablet 4 mg    ondansetron  (ZOFRAN ) injection 4 mg   DISCONTD: senna-docusate (Senokot-S) tablet 1 tablet   albuterol  (PROVENTIL ) (2.5 MG/3ML) 0.083% nebulizer solution 2.5 mg   aspirin  EC tablet 81 mg    Swallow whole.     atorvastatin  (LIPITOR) tablet 40 mg   docusate sodium  (COLACE) capsule 200 mg   pantoprazole  (PROTONIX ) EC tablet 40 mg   saccharomyces boulardii (FLORASTOR) capsule 250 mg   melatonin tablet 9 mg   loratadine  (CLARITIN ) tablet 10 mg   guaiFENesin  (ROBITUSSIN) 100 MG/5ML liquid 10 mL   mesalamine  (PENTASA ) CR capsule 1,000 mg   polyethylene glycol (MIRALAX  / GLYCOLAX ) packet 17 g   furosemide  (LASIX ) injection 40 mg    I have reviewed the patients home medicines and have made adjustments as needed  Problem List / ED Course: Problem List Items Addressed This Visit   None Visit Diagnoses       Acute on chronic hypoxic respiratory failure (HCC)    -  Primary                   This note was created using dictation software, which may contain spelling or grammatical errors.    Franklyn Sid SAILOR, MD 07/23/23 [phone]

## 2023-07-23 NOTE — ED Notes (Signed)
 PT stuck 2 times unsuccessful.PT states she is usually a doplar for IV.IV team consulted.

## 2023-07-23 NOTE — ED Notes (Signed)
 Called and placed PT on monitor with CCMD.

## 2023-07-24 ENCOUNTER — Inpatient Hospital Stay (HOSPITAL_COMMUNITY)

## 2023-07-24 DIAGNOSIS — I5043 Acute on chronic combined systolic (congestive) and diastolic (congestive) heart failure: Secondary | ICD-10-CM

## 2023-07-24 LAB — BASIC METABOLIC PANEL WITH GFR
Anion gap: 11 (ref 5–15)
BUN: 34 mg/dL — ABNORMAL HIGH (ref 8–23)
CO2: 31 mmol/L (ref 22–32)
Calcium: 7.9 mg/dL — ABNORMAL LOW (ref 8.9–10.3)
Chloride: 94 mmol/L — ABNORMAL LOW (ref 98–111)
Creatinine, Ser: 1.54 mg/dL — ABNORMAL HIGH (ref 0.44–1.00)
GFR, Estimated: 32 mL/min — ABNORMAL LOW (ref >60)
Glucose, Bld: 100 mg/dL — ABNORMAL HIGH (ref 70–99)
Potassium: 3.9 mmol/L (ref 3.5–5.1)
Sodium: 136 mmol/L (ref 135–145)

## 2023-07-24 LAB — CBC
HCT: 31.8 % — ABNORMAL LOW (ref 36.0–46.0)
Hemoglobin: 9.7 g/dL — ABNORMAL LOW (ref 12.0–15.0)
MCH: 30.4 pg (ref 26.0–34.0)
MCHC: 30.5 g/dL (ref 30.0–36.0)
MCV: 99.7 fL (ref 80.0–100.0)
Platelets: 224 K/uL (ref 150–400)
RBC: 3.19 MIL/uL — ABNORMAL LOW (ref 3.87–5.11)
RDW: 18.3 % — ABNORMAL HIGH (ref 11.5–15.5)
WBC: 5.6 K/uL (ref 4.0–10.5)
nRBC: 0 % (ref 0.0–0.2)

## 2023-07-24 MED ORDER — MEDIHONEY WOUND/BURN DRESSING EX PSTE
1.0000 | PASTE | Freq: Every day | CUTANEOUS | Status: DC
  Administered 2023-07-24 – 2023-07-30 (×6): 1 via TOPICAL
  Filled 2023-07-24: qty 44

## 2023-07-24 MED ORDER — FUROSEMIDE 10 MG/ML IJ SOLN
40.0000 mg | Freq: Two times a day (BID) | INTRAMUSCULAR | Status: DC
  Administered 2023-07-24 – 2023-07-25 (×3): 40 mg via INTRAVENOUS
  Filled 2023-07-24 (×3): qty 4

## 2023-07-24 MED ORDER — HEPARIN SODIUM (PORCINE) 5000 UNIT/ML IJ SOLN
5000.0000 [IU] | Freq: Three times a day (TID) | INTRAMUSCULAR | Status: DC
  Administered 2023-07-24 – 2023-07-30 (×17): 5000 [IU] via SUBCUTANEOUS
  Filled 2023-07-24 (×18): qty 1

## 2023-07-24 NOTE — Consult Note (Signed)
 WOC Nurse Consult Note: Reason for Consult:Unstageable pressure injury to sacrum. Resides in SNF.   Deep tissue injury surrounding open wound.  Wound type:pressure injury Pressure Injury POA: Yes Measurement: 4 cm x 3 cm slough to wound bed Wound bed:100% fibrin slough Drainage (amount, consistency, odor) minimal serosanguinous   Periwound: Circumferential nonblanchable erythema, present on admission.  Will monitor for progression of deep tissue injury Dressing procedure/placement/frequency: Cleanse wound to sacrum with NS and pat dry. Apply medihoney to wound bed.  COver with gauze and silicone sacral foam.  Low air loss bed due to potential deep tissue injury surrounding open wound.  Will not follow at this time.  Please re-consult if needed.  Felicia Cooley MSN, RN, FNP-BC CWON Wound, Ostomy, Continence Nurse Outpatient [facility] [phone] Pager [phone]

## 2023-07-24 NOTE — Progress Notes (Signed)
   07/24/23 2002  BiPAP/CPAP/SIPAP  Reason BIPAP/CPAP not in use Non-compliant (pt states she is only going to be here tonight and does not want to wear CPAP tonight. RT stated if she changes her mind, RT will bring CPAP machine to her.)  BiPAP/CPAP /SiPAP Vitals  Pulse Rate (!) 104  Resp 18  SpO2 (!) 87 %  MEWS Score/Color  MEWS Score 1  MEWS Score Color Green

## 2023-07-24 NOTE — ED Notes (Signed)
 3E informed pt on the way

## 2023-07-24 NOTE — Plan of Care (Signed)
  Problem: Skin Integrity: Goal: Risk for impaired skin integrity will decrease Outcome: Progressing   

## 2023-07-24 NOTE — Progress Notes (Signed)
 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 O2 sats were 70% then, EMS was called but it appeared that there was some problem with her concentrator.  Once they switched something out they were able to make sure her oxygen was flowing and her O2 sats came back up.  The patient's mental status returned with that.  Because her O2 sats were so low even that was temporary they brought her in for evaluation   Subjective: -feels fair, reports chronic dyspnea on exertion  Assessment and Plan:  Acute on chronic diastolic CHF, RV failure Pulmonary hypertension  - Last echo 2/25 with EF 50-55%, severely reduced RV, moderately elevated PA systolic pressures, moderate TR - continue IV Lasix  today, increase dose to twice daily - Conservative management, on account of advanced age and frailty - Poor candidate for SGLT2i  COPD/chronic respiratory failure -Stable, resume albuterol  as needed, does not appear to be on maintenance nebs  OSA/OHS Resume CPAP  History of DVT/PE History of IVC filter -No longer on anticoagulation, on account of GI bleed, diverticular bleeds  History of abdominal aortic aneurysm  DVT prophylaxis: SCDs Code Status: DNR Family Communication: None present Disposition Plan: back to SNF in 1-2days  Consultants:    Procedures:   Antimicrobials:    Objective: Vitals:   07/24/23 0915 07/24/23 0916 07/24/23 1200 07/24/23 1231  BP:   117/84   Pulse: 93 94 77   Resp: 19 19 13    Temp:    98.2 F (36.8 C)  TempSrc:    Oral  SpO2:   97%   Weight:      Height:       No intake or output data in the 24 hours ending 07/24/23 1255 Filed Weights    07/23/23 1644  Weight: 82.9 kg    Examination:  General exam: Appears calm and comfortable  HEENT: + JVD Respiratory system: decreased BS at bedside Cardiovascular system: S1 & S2 heard, RRR.  Abd: nondistended, soft and nontender.Normal bowel sounds heard. Central nervous system: Alert and oriented. No focal neurological deficits. Extremities: 1 plus edema Skin: No rashes Psychiatry:  Mood & affect appropriate.     Data Reviewed:   CBC: Recent Labs  Lab 07/23/23 1812 07/23/23 2007 07/24/23 0619  WBC 6.3  --  5.6  NEUTROABS 5.1  --   --   HGB 10.4* 10.5*  10.5* 9.7*  HCT 33.2* 31.0*  31.0* 31.8*  MCV 98.8  --  99.7  PLT 232  --  224   Basic Metabolic Panel: Recent Labs  Lab 07/23/23 1812 07/23/23 2007 07/24/23 0619  NA 134* 133*  134* 136  K 4.0 4.4  4.3 3.9  CL 92* 94* 94*  CO2 32  --  31  GLUCOSE 107* 93 100*  BUN 31* 37* 34*  CREATININE 1.55* 1.60* 1.54*  CALCIUM  7.8*  --  7.9*   GFR: Estimated Creatinine Clearance: 25.8 mL/min (A) (by C-G formula based on SCr of 1.54 mg/dL (H)). Liver Function Tests: Recent Labs  Lab 07/23/23 1812  AST 30  ALT 28  ALKPHOS 74  BILITOT 0.9  PROT 5.5*  ALBUMIN 2.1*  No results for input(s): LIPASE, AMYLASE in the last 168 hours. No results for input(s): AMMONIA in the last 168 hours. Coagulation Profile: No results for input(s): INR, PROTIME in the last 168 hours. Cardiac Enzymes: No results for input(s): CKTOTAL, CKMB, CKMBINDEX, TROPONINI in the last 168 hours. BNP (last 3 results) No results for input(s): PROBNP in the last 8760 hours. HbA1C: No results for input(s): HGBA1C in the last 72 hours. CBG: Recent Labs  Lab 07/23/23 1946  GLUCAP 86   Lipid Profile: No results for input(s): CHOL, HDL, LDLCALC, TRIG, CHOLHDL, LDLDIRECT in the last 72 hours. Thyroid Function Tests: No results for input(s): TSH, T4TOTAL, FREET4, T3FREE, THYROIDAB in the last 72  hours. Anemia Panel: No results for input(s): VITAMINB12, FOLATE, FERRITIN, TIBC, IRON, RETICCTPCT in the last 72 hours. Urine analysis:    Component Value Date/Time   COLORURINE YELLOW 12/31/2020 0100   APPEARANCEUR CLEAR 12/31/2020 0100   LABSPEC 1.020 12/31/2020 0100   PHURINE 6.0 12/31/2020 0100   GLUCOSEU NEGATIVE 12/31/2020 0100   HGBUR NEGATIVE 12/31/2020 0100   BILIRUBINUR NEGATIVE 12/31/2020 0100   KETONESUR NEGATIVE 12/31/2020 0100   PROTEINUR NEGATIVE 12/31/2020 0100   NITRITE POSITIVE (A) 12/31/2020 0100   LEUKOCYTESUR NEGATIVE 12/31/2020 0100   Sepsis Labs: @LABRCNTIP (procalcitonin:4,lacticidven:4)  ) Recent Results (from the past 240 hours)  Blood culture (routine x 2)     Status: None (Preliminary result)   Collection Time: 07/23/23  4:48 PM   Specimen: BLOOD LEFT FOREARM  Result Value Ref Range Status   Specimen Description BLOOD LEFT FOREARM  Final   Special Requests   Final    BOTTLES DRAWN AEROBIC AND ANAEROBIC Blood Culture results may not be optimal due to an inadequate volume of blood received in culture bottles   Culture   Final    NO GROWTH < 12 HOURS Performed at [facility], [address], [place], [place] [address]    Report Status PENDING  Incomplete  Resp panel by RT-PCR (RSV, Flu A&B, Covid) Anterior Nasal Swab     Status: None   Collection Time: 07/23/23  7:35 PM   Specimen: Anterior Nasal Swab  Result Value Ref Range Status   SARS Coronavirus 2 by RT PCR NEGATIVE NEGATIVE Final   Influenza A by PCR NEGATIVE NEGATIVE Final   Influenza B by PCR NEGATIVE NEGATIVE Final    Comment: (NOTE) The Xpert Xpress SARS-CoV-2/FLU/RSV plus assay is intended as an aid in the diagnosis of influenza from Nasopharyngeal swab specimens and should not be used as a sole basis for treatment. Nasal washings and aspirates are unacceptable for Xpert Xpress SARS-CoV-2/FLU/RSV testing.  Fact Sheet for  Patients: BloggerCourse.com  Fact Sheet for Healthcare Providers: SeriousBroker.it  This test is not yet approved or cleared by the [place]  FDA and has been authorized for detection and/or diagnosis of SARS-CoV-2 by FDA under an Emergency Use Authorization (EUA). This EUA will remain in effect (meaning this test can be used) for the duration of the COVID-19 declaration under Section 564(b)(1) of the Act, 21 U.S.C. section 360bbb-3(b)(1), unless the authorization is terminated or revoked.     Resp Syncytial Virus by PCR NEGATIVE NEGATIVE Final    Comment: (NOTE) Fact Sheet for Patients: BloggerCourse.com  Fact Sheet for Healthcare Providers: SeriousBroker.it  This test is not yet approved or cleared by the [place]  FDA and has been authorized for detection and/or diagnosis of SARS-CoV-2 by FDA under an Emergency Use Authorization (EUA). This EUA will remain in  effect (meaning this test can be used) for the duration of the COVID-19 declaration under Section 564(b)(1) of the Act, 21 U.S.C. section 360bbb-3(b)(1), unless the authorization is terminated or revoked.  Performed at [facility], [address] [place], [place] [address]      Radiology Studies: DG Chest Portable 1 View Result Date: 07/23/2023 CLINICAL DATA:  hypoxia EXAM: PORTABLE CHEST - 1 VIEW COMPARISON:  June 25, 2023, April 07, 2023 FINDINGS: Diffuse bilateral perihilar interstitial opacities. Patchy airspace opacities in both lung bases. Small bilateral pleural effusions. No pneumothorax. Mild cardiomegaly. Left chest pacemaker with leads terminating in the right atrium and right ventricle. Tortuous aorta with aortic atherosclerosis. No acute fracture or destructive lesions. Cervical fusion hardware partially visualized. Multilevel thoracic osteophytosis. Surgical clips in the left neck. IMPRESSION:  Cardiomegaly with findings of pulmonary edema and small bilateral pleural effusions. Electronically Signed   By: Rogelia Myers M.D.   On: 07/23/2023 17:08     Scheduled Meds:  aspirin  EC  81 mg Oral q AM   atorvastatin   40 mg Oral QHS   docusate sodium   200 mg Oral q AM   furosemide   40 mg Intravenous Daily   guaiFENesin   10 mL Oral TID   loratadine   10 mg Oral Daily   melatonin  9 mg Oral QHS   mesalamine   1,000 mg Oral BID   pantoprazole   40 mg Oral BID   saccharomyces boulardii  250 mg Oral BID   Continuous Infusions:   LOS: 1 day    Time spent:    Sigurd Pac, MD [facility]   07/24/2023, 12:55 PM

## 2023-07-25 DIAGNOSIS — I5043 Acute on chronic combined systolic (congestive) and diastolic (congestive) heart failure: Secondary | ICD-10-CM | POA: Diagnosis not present

## 2023-07-25 LAB — BASIC METABOLIC PANEL WITH GFR
Anion gap: 8 (ref 5–15)
BUN: 40 mg/dL — ABNORMAL HIGH (ref 8–23)
CO2: 30 mmol/L (ref 22–32)
Calcium: 8 mg/dL — ABNORMAL LOW (ref 8.9–10.3)
Chloride: 94 mmol/L — ABNORMAL LOW (ref 98–111)
Creatinine, Ser: 1.8 mg/dL — ABNORMAL HIGH (ref 0.44–1.00)
GFR, Estimated: 26 mL/min — ABNORMAL LOW (ref >60)
Glucose, Bld: 95 mg/dL (ref 70–99)
Potassium: 3.9 mmol/L (ref 3.5–5.1)
Sodium: 132 mmol/L — ABNORMAL LOW (ref 135–145)

## 2023-07-25 MED ORDER — FLUTICASONE FUROATE-VILANTEROL 200-25 MCG/ACT IN AEPB
1.0000 | INHALATION_SPRAY | Freq: Every day | RESPIRATORY_TRACT | Status: DC
  Administered 2023-07-26 – 2023-07-30 (×5): 1 via RESPIRATORY_TRACT
  Filled 2023-07-25: qty 28

## 2023-07-25 MED ORDER — MIDODRINE HCL 5 MG PO TABS
5.0000 mg | ORAL_TABLET | Freq: Two times a day (BID) | ORAL | Status: DC
  Administered 2023-07-25: 5 mg via ORAL
  Filled 2023-07-25: qty 1

## 2023-07-25 NOTE — Progress Notes (Addendum)
 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 O2 sats were 70% then, EMS was called but it appeared that there was some problem with her concentrator.  Once they switched something out they were able to make sure her oxygen was flowing and her O2 sats came back up.  The patient's mental status returned with that.  Because her O2 sats were so low even that was temporary they brought her in for evaluation   Subjective: -feels fair, reports chronic dyspnea on exertion  Assessment and Plan:  Acute on chronic combined CHF, RV failure Pulmonary hypertension  Moderate to severe mitral regurgitation - Last echo 2/25 with EF 50-55%, severely reduced RV, moderately elevated PA systolic pressures, moderate TR -Repeat echo with EF down to 45%, severe LVH, reduced RV, moderate to severe mitral regurgitation - Seen by cards during hospitalization in March, palliative care suggested then  - I think her overall prognosis is poor, not a candidate for advanced therapies including valve surgery  - Continue IV Lasix  today, attempt to optimize medically, add low dose midodrine  - GDMT limited by CKD, poor candidate for SGLT2 I - Discussed disease burden with patient again today, suggested palliative care evaluation  COPD/chronic respiratory failure -On 4 L home O2 at baseline -Stable, resume albuterol  as needed, does not appear to be on maintenance nebs  OSA/OHS Resume CPAP  History of DVT/PE History of IVC filter -No longer on anticoagulation, on account of GI bleed, diverticular bleeds  History of abdominal aortic aneurysm  DVT prophylaxis: Heparin  subcutaneous Code Status:  DNR Family Communication: None present, will call and update daughter Disposition Plan: back to SNF in 1-2days  Consultants:    Procedures:   Antimicrobials:    Objective: Vitals:   07/24/23 1943 07/24/23 2002 07/25/23 0032 07/25/23 0514  BP: 102/79  99/67 92/72  Pulse: 100 (!) 104 96 100  Resp: 20 18 17 20   Temp: 97.8 F (36.6 C)  97.7 F (36.5 C) 97.9 F (36.6 C)  TempSrc: Oral  Oral Oral  SpO2: 90% (!) 87% 93% 91%  Weight:    89.7 kg  Height:        Intake/Output Summary (Last 24 hours) at 07/25/2023 1136 Last data filed at 07/25/2023 0515 Gross per 24 hour  Intake 240 ml  Output 550 ml  Net -310 ml   Filed Weights   07/23/23 1644 07/25/23 0514  Weight: 82.9 kg 89.7 kg    Examination:  General exam: Appears calm and comfortable  HEENT: + JVD Respiratory system: decreased BS at bedside Cardiovascular system: S1 & S2 heard, RRR.  Abd: nondistended, soft and nontender.Normal bowel sounds heard. Central nervous system: Alert and oriented. No focal neurological deficits. Extremities: 1 plus edema Skin: No rashes Psychiatry:  Mood & affect appropriate.     Data Reviewed:   CBC: Recent Labs  Lab 07/23/23 1812 07/23/23 2007 07/24/23 0619  WBC 6.3  --  5.6  NEUTROABS 5.1  --   --   HGB 10.4* 10.5*  10.5* 9.7*  HCT 33.2* 31.0*  31.0* 31.8*  MCV 98.8  --  99.7  PLT 232  --  224  Basic Metabolic Panel: Recent Labs  Lab 07/23/23 1812 07/23/23 2007 07/24/23 0619 07/25/23 0258  NA 134* 133*  134* 136 132*  K 4.0 4.4  4.3 3.9 3.9  CL 92* 94* 94* 94*  CO2 32  --  31 30  GLUCOSE 107* 93 100* 95  BUN 31* 37* 34* 40*  CREATININE 1.55* 1.60* 1.54* 1.80*  CALCIUM  7.8*  --  7.9* 8.0*   GFR: Estimated Creatinine Clearance: 23 mL/min (A) (by C-G formula based on SCr of 1.8 mg/dL (H)). Liver Function Tests: Recent Labs  Lab 07/23/23 1812  AST 30  ALT 28  ALKPHOS 74  BILITOT 0.9  PROT 5.5*  ALBUMIN 2.1*   No results for input(s): LIPASE,  AMYLASE in the last 168 hours. No results for input(s): AMMONIA in the last 168 hours. Coagulation Profile: No results for input(s): INR, PROTIME in the last 168 hours. Cardiac Enzymes: No results for input(s): CKTOTAL, CKMB, CKMBINDEX, TROPONINI in the last 168 hours. BNP (last 3 results) No results for input(s): PROBNP in the last 8760 hours. HbA1C: No results for input(s): HGBA1C in the last 72 hours. CBG: Recent Labs  Lab 07/23/23 1946  GLUCAP 86   Lipid Profile: No results for input(s): CHOL, HDL, LDLCALC, TRIG, CHOLHDL, LDLDIRECT in the last 72 hours. Thyroid Function Tests: No results for input(s): TSH, T4TOTAL, FREET4, T3FREE, THYROIDAB in the last 72 hours. Anemia Panel: No results for input(s): VITAMINB12, FOLATE, FERRITIN, TIBC, IRON, RETICCTPCT in the last 72 hours. Urine analysis:    Component Value Date/Time   COLORURINE YELLOW 12/31/2020 0100   APPEARANCEUR CLEAR 12/31/2020 0100   LABSPEC 1.020 12/31/2020 0100   PHURINE 6.0 12/31/2020 0100   GLUCOSEU NEGATIVE 12/31/2020 0100   HGBUR NEGATIVE 12/31/2020 0100   BILIRUBINUR NEGATIVE 12/31/2020 0100   KETONESUR NEGATIVE 12/31/2020 0100   PROTEINUR NEGATIVE 12/31/2020 0100   NITRITE POSITIVE (A) 12/31/2020 0100   LEUKOCYTESUR NEGATIVE 12/31/2020 0100   Sepsis Labs: @LABRCNTIP (procalcitonin:4,lacticidven:4)  ) Recent Results (from the past 240 hours)  Blood culture (routine x 2)     Status: None (Preliminary result)   Collection Time: 07/23/23  4:48 PM   Specimen: BLOOD LEFT FOREARM  Result Value Ref Range Status   Specimen Description BLOOD LEFT FOREARM  Final   Special Requests   Final    BOTTLES DRAWN AEROBIC AND ANAEROBIC Blood Culture results may not be optimal due to an inadequate volume of blood received in culture bottles   Culture   Final    NO GROWTH 2 DAYS Performed at [facility], [address], [place], [place] [address]    Report  Status PENDING  Incomplete  Resp panel by RT-PCR (RSV, Flu A&B, Covid) Anterior Nasal Swab     Status: None   Collection Time: 07/23/23  7:35 PM   Specimen: Anterior Nasal Swab  Result Value Ref Range Status   SARS Coronavirus 2 by RT PCR NEGATIVE NEGATIVE Final   Influenza A by PCR NEGATIVE NEGATIVE Final   Influenza B by PCR NEGATIVE NEGATIVE Final    Comment: (NOTE) The Xpert Xpress SARS-CoV-2/FLU/RSV plus assay is intended as an aid in the diagnosis of influenza from Nasopharyngeal swab specimens and should not be used as a sole basis for treatment. Nasal washings and aspirates are unacceptable for Xpert Xpress SARS-CoV-2/FLU/RSV testing.  Fact Sheet for Patients: BloggerCourse.com  Fact Sheet for Healthcare Providers: SeriousBroker.it  This test is not yet approved or cleared by the [place]  FDA and has  been authorized for detection and/or diagnosis of SARS-CoV-2 by FDA under an Emergency Use Authorization (EUA). This EUA will remain in effect (meaning this test can be used) for the duration of the COVID-19 declaration under Section 564(b)(1) of the Act, 21 U.S.C. section 360bbb-3(b)(1), unless the authorization is terminated or revoked.     Resp Syncytial Virus by PCR NEGATIVE NEGATIVE Final    Comment: (NOTE) Fact Sheet for Patients: BloggerCourse.com  Fact Sheet for Healthcare Providers: SeriousBroker.it  This test is not yet approved or cleared by the [place]  FDA and has been authorized for detection and/or diagnosis of SARS-CoV-2 by FDA under an Emergency Use Authorization (EUA). This EUA will remain in effect (meaning this test can be used) for the duration of the COVID-19 declaration under Section 564(b)(1) of the Act, 21 U.S.C. section 360bbb-3(b)(1), unless the authorization is terminated or revoked.  Performed at [facility], [address] [place], [place] [address]      Radiology Studies: ECHOCARDIOGRAM LIMITED Result Date: 07/24/2023    ECHOCARDIOGRAM LIMITED REPORT   Patient Name:   Felicia Benson Date of Exam: 07/24/2023 Medical Rec #:  [id number]        Height:       66.0 in Accession #:    [id number]       Weight:       182.8 lb Date of Birth:  21-Jun-1930         BSA:          1.925 m Patient Age:    89 years         BP:           97/64 mmHg Patient Gender: F                HR:           98 bpm. Exam Location:  Inpatient Procedure: 2D Echo, Limited Echo and Limited Color Doppler (Both Spectral and            Color Flow Doppler were utilized during procedure). Indications:    Congestive Heart Failure  History:        Patient has prior history of Echocardiogram examinations. CHF,                 CAD, TIA; Risk Factors:Dyslipidemia.  Sonographer:    Christiana Mbomeh Referring Phys: [id number] CLAUDIA CLAIBORNE IMPRESSIONS  1. Left ventricular ejection fraction, by estimation, is 45 to 50%. The left ventricle has mildly decreased function. The left ventricle demonstrates global hypokinesis. There is severe asymmetric left ventricular hypertrophy of the septal segment. There is the interventricular septum is flattened in systole and diastole, consistent with right ventricular pressure and volume overload.  2. Right ventricular systolic function is mildly reduced. The right ventricular size is moderately enlarged.  3. The mitral valve is normal in structure. Moderate to severe mitral valve regurgitation. No evidence of mitral stenosis.  4. Tricuspid valve regurgitation is moderate.  5. The aortic valve is normal in structure. Aortic valve regurgitation is mild. No aortic stenosis is present.  6. The inferior vena cava is normal in size with greater than 50% respiratory variability, suggesting right atrial pressure of 3 mmHg. FINDINGS  Left Ventricle: Left ventricular ejection fraction, by estimation, is 45 to 50%. The left ventricle has mildly  decreased function. The left ventricle demonstrates global hypokinesis. The left ventricular internal cavity size was normal in size. There is  severe asymmetric left ventricular hypertrophy of the  septal segment. The interventricular septum is flattened in systole and diastole, consistent with right ventricular pressure and volume overload. Right Ventricle: The right ventricular size is moderately enlarged. No increase in right ventricular wall thickness. Right ventricular systolic function is mildly reduced. Left Atrium: Left atrial size was normal in size. Right Atrium: Right atrial size was normal in size. Pericardium: There is no evidence of pericardial effusion. Mitral Valve: The mitral valve is normal in structure. Moderate to severe mitral valve regurgitation. No evidence of mitral valve stenosis. Tricuspid Valve: The tricuspid valve is normal in structure. Tricuspid valve regurgitation is moderate . No evidence of tricuspid stenosis. Aortic Valve: The aortic valve is normal in structure. Aortic valve regurgitation is mild. No aortic stenosis is present. Pulmonic Valve: The pulmonic valve was normal in structure. Pulmonic valve regurgitation is not visualized. No evidence of pulmonic stenosis. Aorta: The aortic root is normal in size and structure. Venous: The inferior vena cava is normal in size with greater than 50% respiratory variability, suggesting right atrial pressure of 3 mmHg. IAS/Shunts: No atrial level shunt detected by color flow Doppler. Additional Comments: A device lead is visualized.  LEFT VENTRICLE PLAX 2D LVIDd:         4.20 cm LVIDs:         3.00 cm LV PW:         1.30 cm LV IVS:        1.60 cm LVOT diam:     2.10 cm LV SV:         30 LV SV Index:   16 LVOT Area:     3.46 cm  RIGHT VENTRICLE            IVC RV FAC:         13.3 %     IVC diam: 2.20 cm RV S prime:     8.24 cm/s TAPSE (M-mode): 1.4 cm LEFT ATRIUM             Index        RIGHT ATRIUM           Index LA Vol (A2C):   59.6 ml  30.97 ml/m  RA Area:     22.50 cm LA Vol (A4C):   35.5 ml 18.44 ml/m  RA Volume:   65.60 ml  34.08 ml/m LA Biplane Vol: 46.0 ml 23.90 ml/m  AORTIC VALVE LVOT Vmax:   65.53 cm/s LVOT Vmean:  39.867 cm/s LVOT VTI:    0.088 m TRICUSPID VALVE TR Peak grad:   62.4 mmHg TR Vmax:        395.00 cm/s  SHUNTS Systemic VTI:  0.09 m Systemic Diam: 2.10 cm Kardie Tobb DO Electronically signed by Dub Huntsman DO Signature Date/Time: 07/24/2023/2:59:02 PM    Final    DG Chest Portable 1 View Result Date: 07/23/2023 CLINICAL DATA:  hypoxia EXAM: PORTABLE CHEST - 1 VIEW COMPARISON:  June 25, 2023, April 07, 2023 FINDINGS: Diffuse bilateral perihilar interstitial opacities. Patchy airspace opacities in both lung bases. Small bilateral pleural effusions. No pneumothorax. Mild cardiomegaly. Left chest pacemaker with leads terminating in the right atrium and right ventricle. Tortuous aorta with aortic atherosclerosis. No acute fracture or destructive lesions. Cervical fusion hardware partially visualized. Multilevel thoracic osteophytosis. Surgical clips in the left neck. IMPRESSION: Cardiomegaly with findings of pulmonary edema and small bilateral pleural effusions. Electronically Signed   By: Rogelia Myers M.D.   On: 07/23/2023 17:08     Scheduled Meds:  aspirin  EC  81 mg Oral q AM   atorvastatin   40 mg Oral QHS   docusate sodium   200 mg Oral q AM   fluticasone  furoate-vilanterol  1 puff Inhalation Daily   furosemide   40 mg Intravenous BID   guaiFENesin   10 mL Oral TID   heparin  injection (subcutaneous)  5,000 Units Subcutaneous Q8H   leptospermum manuka honey  1 Application Topical Daily   loratadine   10 mg Oral Daily   melatonin  9 mg Oral QHS   mesalamine   1,000 mg Oral BID   midodrine   5 mg Oral BID WC   pantoprazole   40 mg Oral BID   saccharomyces boulardii  250 mg Oral BID   Continuous Infusions:   LOS: 2 days    Time spent:    Sigurd Pac, MD [facility]   07/25/2023,  11:36 AM

## 2023-07-25 NOTE — Progress Notes (Signed)
 Mobility Specialist Progress Note:   07/25/23 1055  Mobility  Activity Transferred from bed to chair  Level of Assistance Minimal assist, patient does 75% or more  Assistive Device Front wheel walker  Distance Ambulated (ft) 3 ft  Activity Response Tolerated well  Mobility Referral Yes  Mobility visit 1 Mobility  Mobility Specialist Start Time (ACUTE ONLY) 1055  Mobility Specialist Stop Time (ACUTE ONLY) 1115  Mobility Specialist Time Calculation (min) (ACUTE ONLY) 20 min   NT requesting assistance with cleaning pt, and transfer. Pericare performed and pt able to transfer to chair with light minA and RW. No c/o throughout. Pt left in chair with all needs met.   Therisa Rana Mobility Specialist Please contact via SecureChat or  Rehab office at [phone]

## 2023-07-25 NOTE — Progress Notes (Signed)
 Heart Failure Navigator Progress Note  Assessed for Heart & Vascular TOC clinic readiness.  Patient does not meet criteria due to EF 45-50%, Palliative consult, No HF TOC per Dr. Fairy, has a scheduled [facility] appointment on 08/29/2023. .   Navigator will sign off at this time.   [name] Haddock, BSN, Scientist, clinical (histocompatibility and immunogenetics) Only

## 2023-07-25 NOTE — Progress Notes (Signed)
   07/25/23 2107  BiPAP/CPAP/SIPAP  Reason BIPAP/CPAP not in use Non-compliant (Pt stated she does not want to wear our CPAP. Pt informed  if she changes her mind, then RT will bring a CPAP to her room.)  BiPAP/CPAP /SiPAP Vitals  Pulse Rate 90  SpO2 95 %  MEWS Score/Color  MEWS Score 0  MEWS Score Color Landy

## 2023-07-25 NOTE — Progress Notes (Addendum)
 PT Cancellation Note  Patient Details Name: Felicia Benson MRN: [id number] DOB: 1930-08-08   Cancelled Treatment:    Reason Eval/Treat Not Completed: Patient declined, no reason specified (pt stating need to have BM in bed, get new food tray, get a bath and then will be willing to mobilize)  0908 attempted again and pt stating still awaiting BM  1115 3rd attempt pt up to chair with nursing staff and refused doing any further activity until after lunch. Will attempt next date. Pt reports no concerns with her function prior to return to [employer] B [name] 07/25/2023, 7:53 AM Lenoard SQUIBB, PT Acute Rehabilitation Services Office: [phone]

## 2023-07-26 DIAGNOSIS — Z7189 Other specified counseling: Secondary | ICD-10-CM

## 2023-07-26 DIAGNOSIS — I5043 Acute on chronic combined systolic (congestive) and diastolic (congestive) heart failure: Secondary | ICD-10-CM | POA: Diagnosis not present

## 2023-07-26 DIAGNOSIS — Z515 Encounter for palliative care: Secondary | ICD-10-CM | POA: Diagnosis not present

## 2023-07-26 DIAGNOSIS — Z66 Do not resuscitate: Secondary | ICD-10-CM

## 2023-07-26 LAB — ALBUMIN: Albumin: 2 g/dL — ABNORMAL LOW (ref 3.5–5.0)

## 2023-07-26 LAB — BASIC METABOLIC PANEL WITH GFR
Anion gap: 9 (ref 5–15)
BUN: 40 mg/dL — ABNORMAL HIGH (ref 8–23)
CO2: 30 mmol/L (ref 22–32)
Calcium: 7.9 mg/dL — ABNORMAL LOW (ref 8.9–10.3)
Chloride: 95 mmol/L — ABNORMAL LOW (ref 98–111)
Creatinine, Ser: 1.63 mg/dL — ABNORMAL HIGH (ref 0.44–1.00)
GFR, Estimated: 30 mL/min — ABNORMAL LOW (ref >60)
Glucose, Bld: 114 mg/dL — ABNORMAL HIGH (ref 70–99)
Potassium: 4 mmol/L (ref 3.5–5.1)
Sodium: 134 mmol/L — ABNORMAL LOW (ref 135–145)

## 2023-07-26 MED ORDER — MIDODRINE HCL 5 MG PO TABS
10.0000 mg | ORAL_TABLET | Freq: Three times a day (TID) | ORAL | Status: DC
  Administered 2023-07-26 – 2023-07-29 (×11): 10 mg via ORAL
  Filled 2023-07-26 (×11): qty 2

## 2023-07-26 MED ORDER — ACETAMINOPHEN 650 MG RE SUPP: 650.0000 mg | Freq: Four times a day (QID) | RECTAL | Status: DC | PRN

## 2023-07-26 MED ORDER — ACETAMINOPHEN 500 MG PO TABS
1000.0000 mg | ORAL_TABLET | Freq: Four times a day (QID) | ORAL | Status: DC | PRN
  Administered 2023-07-26 – 2023-07-29 (×4): 1000 mg via ORAL
  Filled 2023-07-26 (×5): qty 2

## 2023-07-26 MED ORDER — TORSEMIDE 20 MG PO TABS
20.0000 mg | ORAL_TABLET | Freq: Every day | ORAL | Status: DC
  Administered 2023-07-26: 20 mg via ORAL
  Filled 2023-07-26 (×2): qty 1

## 2023-07-26 MED ORDER — ALBUMIN HUMAN 25 % IV SOLN
12.5000 g | Freq: Once | INTRAVENOUS | Status: AC
  Administered 2023-07-26: 12.5 g via INTRAVENOUS
  Filled 2023-07-26: qty 50

## 2023-07-26 NOTE — Consult Note (Signed)
 Consultation Note Date: 07/26/2023   Patient Name: Felicia Benson  DOB: Nov 20, 1930  MRN: [id number]  Age / Sex: 89 y.o., female  PCP: Patient, No Pcp Per Referring Physician: Fairy Frames, MD  Reason for Consultation: Establishing goals of care  HPI/Patient Profile: 89 y.o. female  with past medical history of AAA, diastolic CHF, COPD on chronically 4 L O2 nasal cannula, history of DVT and PE with IVC filter in place, history of obstructive sleep apnea, GI bleed, GERD and diverticular disease admitted on 07/23/2023 with acute on chronic combined CHF, pulm htn, and mod to severe mitral regurg. She is not a candidate for advanced therapies including valve surgery. PMT consulted to discuss GOC.   Clinical Assessment and Goals of Care: I have reviewed medical records including EPIC notes, labs and imaging, assessed the patient and then met with patient  to discuss diagnosis prognosis, GOC, EOL wishes, disposition and options.  I introduced Palliative Medicine as specialized medical care for people living with serious illness. It focuses on providing relief from the symptoms and stress of a serious illness. The goal is to improve quality of life for both the patient and the family.  Patient and I met in April of this year - she recalls that meeting. At that time, we had discussed going to rehab with outpatient palliative to follow with a potential transition to hospice eventually.   We discussed a brief life review of the patient. She tells me her spouse passed away in 08-20-2012. She has 4 children - 3 live in [place], 1 is out of state. 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   As far as functional and nutritional status Felicia Benson tells me she has been doing well at [facility]. She has continued with therapies - tells me she is moving  well and eating well. She tells me she feels she is well cared for at [facility] and would like to return there.    We discussed patient's current illness and what it means in the larger context of patient's on-going co-morbidities.   We review that during her admission in April we had discussed eventual involvement of hospice - I inquire about her thoughts on this. She tells me she is not at all interested in hospice at this time. She tells me her roommate is receiving hospice care so she is very familiar (as well as her nursing experience). She understands philosophy of hospice and type of support provided and is not ready for that. We discuss at some point she may change her mind - she will make those decisions at that time. We discuss asking palliative to follow and assist with these decisions - she agrees to this.   We discuss with further complications how she would want her care to be handled - at this point, she would want readmission to hospital if her condition declined.   Discussed with patient the importance of continued conversation with family and the medical providers regarding overall plan of care and treatment options, ensuring decisions are within the context of the patient's values and GOCs.    Felicia Benson asks me to update her daughter [name] - call to [facility]. Reviewed all of the above. [name] understands and agrees.   Questions and concerns were addressed. The family was encouraged to call with questions or concerns.   Reached out  to [facility] liaison letting them know patient continues to be interested in outpatient palliative following at [facility].   Primary Decision Maker PATIENT Daughter [name] if patient unable    SUMMARY OF RECOMMENDATIONS   - wants to return to [place] w/ outpatient palliative to follow - aware of hospice services available to her and will reach out when she is ready, does not feel ready at all at this point - [facility] liaison notified of ongoing  agreement to outpatient palliative - at this point, if declines at SNF, would want readmission to hospital  Code Status/Advance Care Planning: DNR     Primary Diagnoses: Present on Admission:  Chronic hypoxic respiratory failure (HCC)  COPD (chronic obstructive pulmonary disease) (HCC)  GERD (gastroesophageal reflux disease)  OSA (obstructive sleep apnea)  CKD (chronic kidney disease)  Sacral ulcer (HCC)  Hypoxic episode   I have reviewed the medical record, interviewed the patient and family, and examined the patient. The following aspects are pertinent.  Past Medical History:  Diagnosis Date   AAA (abdominal aortic aneurysm) (HCC)    CHF (congestive heart failure) (HCC)    Crohn's disease (HCC)    Diverticulitis    Diverticulosis    GERD (gastroesophageal reflux disease)    GI bleed    Hypertension    OSA (obstructive sleep apnea)    Pacemaker    Thyroid nodule    Social History   Socioeconomic History   Marital status: Widowed    Spouse name: Not on file   Number of children: Not on file   Years of education: Not on file   Highest education level: Not on file  Occupational History   Not on file  Tobacco Use   Smoking status: Former   Smokeless tobacco: Never  Vaping Use   Vaping status: Never Used  Substance and Sexual Activity   Alcohol  use: No    Comment: Quit in 1985   Drug use: No   Sexual activity: Not on file  Other Topics Concern   Not on file  Social History Narrative   Not on file   Social Drivers of Health   Financial Resource Strain: Not on file  Food Insecurity: No Food Insecurity (07/24/2023)   Hunger Vital Sign    Worried About Running Out of Food in the Last Year: Never true    Ran Out of Food in the Last Year: Never true  Transportation Needs: No Transportation Needs (07/24/2023)   PRAPARE - Administrator, Civil Service (Medical): No    Lack of Transportation (Non-Medical): No  Physical Activity: Not on file  Stress:  Not on file  Social Connections: Moderately Isolated (07/24/2023)   Social Connection and Isolation Panel    Frequency of Communication with Friends and Family: More than three times a week    Frequency of Social Gatherings with Friends and Family: More than three times a week    Attends Religious Services: Never    Database administrator or Organizations: No    Attends Banker Meetings: 1 to 4 times per year    Marital Status: Widowed   Family History  Problem Relation Age of Onset   CVA Mother    Lung cancer Father    Colon cancer Neg Hx    Scheduled Meds:  aspirin  EC  81 mg Oral q AM   atorvastatin   40 mg Oral QHS   docusate sodium   200 mg Oral q AM   fluticasone  furoate-vilanterol  1 puff Inhalation Daily   guaiFENesin   10 mL Oral TID   heparin  injection (subcutaneous)  5,000 Units Subcutaneous Q8H   leptospermum manuka honey  1 Application Topical Daily   loratadine   10 mg Oral Daily   melatonin  9 mg Oral QHS   mesalamine   1,000 mg Oral BID   midodrine   10 mg Oral TID WC   pantoprazole   40 mg Oral BID   saccharomyces boulardii  250 mg Oral BID   torsemide   20 mg Oral Daily   Continuous Infusions: PRN Meds:.acetaminophen  **OR** acetaminophen , albuterol , ondansetron  **OR** ondansetron  (ZOFRAN ) IV, polyethylene glycol Allergies  Allergen Reactions   Motrin [Ibuprofen] Other (See Comments)    GI bleed   Ace Inhibitors Cough   Breztri  Aerosphere [Budeson-Glycopyrrol-Formoterol ] Hypertension   Cipro [Ciprofloxacin Hcl] Other (See Comments)    Avoid fluoroquinolones due to asc-aortic aneurysm   Levaquin [Levofloxacin] Other (See Comments)    Avoid fluoroquinolones due to asc-aortic aneurysm   Nsaids Nausea And Vomiting and Other (See Comments)    Hx of GI bleed   Review of Systems  Constitutional:  Positive for fatigue.  Neurological:  Positive for weakness.    Physical Exam Constitutional:      General: She is not in acute distress.    Appearance:  She is ill-appearing.  Pulmonary:     Effort: Pulmonary effort is normal.  Skin:    General: Skin is warm and dry.  Neurological:     Mental Status: She is alert and oriented to person, place, and time.     Vital Signs: BP 114/68 (BP Location: Left Arm)   Pulse 91   Temp 97.6 F (36.4 C) (Oral)   Resp 16   Ht 5' 6 (1.676 m)   Wt 88.4 kg   SpO2 94%   BMI 31.46 kg/m  Pain Scale: Not given for pain   Pain Score: 0-No pain   SpO2: SpO2: 94 % O2 Device:SpO2: 94 % O2 Flow Rate: .O2 Flow Rate (L/min): 6 L/min  IO: Intake/output summary:  Intake/Output Summary (Last 24 hours) at 07/26/2023 1002 Last data filed at 07/26/2023 0500 Gross per 24 hour  Intake 159.91 ml  Output 1875 ml  Net -1715.09 ml    LBM: Last BM Date : 07/24/23 Baseline Weight: Weight: 82.9 kg Most recent weight: Weight: 88.4 kg     Palliative Assessment/Data: PPS 50%     *Please note that this is a verbal dictation therefore any spelling or grammatical errors are due to the Dragon Medical One system interpretation.   Time Total: 65 minutes Time spent includes: Detailed review of medical records (labs, imaging, vital signs), medically appropriate exam, discussion with treatment team, counseling and educating patient, family and/or staff, documenting clinical information, medication management and coordination of care.    Tobey Jama Barnacle, DNP, AGNP-C Palliative Medicine Team [phone] Pager: [phone]

## 2023-07-26 NOTE — Care Management Obs Status (Deleted)
 MEDICARE OBSERVATION STATUS NOTIFICATION   Patient Details  Name: Felicia Benson MRN: [id number] Date of Birth: August 19, 1930   Medicare Observation Status Notification Given:  Yes    Waddell Barnie Rama, RN 07/26/2023, 11:43 AM

## 2023-07-26 NOTE — Progress Notes (Signed)
 Felicia Benson rm [id number] [facility] Liaison note:  This patient is currently enrolled in [facility] outpatient-based palliative care. Hospital Liaison will continue to follow for discharge disposition. Please call for any outpatient based palliative care related questions or concerns. Thank you, Hunter Seip, BSN, [facility] liaison [phone]

## 2023-07-26 NOTE — Evaluation (Signed)
 Physical Therapy Evaluation Patient Details Name: Felicia Benson MRN: [id number] DOB: [birth year] Today's Date: 07/26/2023  History of Present Illness  89 yo female admitted 07/23/23 from [facility] with SOB, decreased LOC and hypoxia.  PMHx: AAA, CHF, Crohn's Disease, GERD, GIB, HTN, OSA, PPM, DVT with IVC filter,  Rt femur fx s/p IM nail  Clinical Impression  Pt very pleasant and reports using adult brief at baseline with assist to change a few times /day and is actively working with therapy at [facility] for walking and WC mobility with pt propelling WC grossly 60' and walking short distance with WC follow. Pt denied these activities acutely stating preference to pivot to chair and complete further therapy at SNF. Will defer to mobility/nursing acutely with encouragement to be OOB daily and continued seated HEp. Patient will benefit from continued inpatient follow up therapy, <3 hours/day         If plan is discharge home, recommend the following: A little help with walking and/or transfers;A lot of help with bathing/dressing/bathroom;Assistance with cooking/housework;Assist for transportation   Can travel by private vehicle   No    Equipment Recommendations None recommended by PT  Recommendations for Other Services       Functional Status Assessment Patient has not had a recent decline in their functional status     Precautions / Restrictions Precautions Precautions: Fall;Other (comment) Recall of Precautions/Restrictions: Impaired Precaution/Restrictions Comments: incontinent B/B      Mobility  Bed Mobility Overal bed mobility: Modified Independent             General bed mobility comments: pt able to roll to side and rise to sitting with use of rail and bed flat    Transfers Overall transfer level: Needs assistance   Transfers: Sit to/from Stand, Bed to chair/wheelchair/BSC Sit to Stand: Contact guard assist Stand pivot transfers: Contact guard assist          General transfer comment: CGA with RW present to stand from bed and pivot to chair, pt denied further mobility    Ambulation/Gait               General Gait Details: denied gait without WC follow  Stairs            Wheelchair Mobility     Tilt Bed    Modified Rankin (Stroke Patients Only)       Balance Overall balance assessment: History of Falls                                           Pertinent Vitals/Pain Pain Assessment Pain Assessment: No/denies pain    Home Living Family/patient expects to be discharged to:: Skilled nursing facility                   Additional Comments: Pt from [facility]. She is a retired Engineer, civil (consulting).    Prior Function Prior Level of Function : Needs assist;History of Falls (last six months)       Physical Assist : Mobility (physical);ADLs (physical) Mobility (physical): Gait ADLs (physical): Bathing;Dressing;Toileting Mobility Comments: Pt reports she has been ambulating ~20ft using RW with a w/c follow during her PT sessions. She is modI for bed mobility and  transfers using RW to w/c. Hx of falls ADLs Comments: assist for ADLs staff helps her change briefs, self feeds, staff does IADLs     Extremity/Trunk  Assessment   Upper Extremity Assessment Upper Extremity Assessment: Generalized weakness    Lower Extremity Assessment Lower Extremity Assessment: Generalized weakness    Cervical / Trunk Assessment Cervical / Trunk Assessment: Kyphotic  Communication   Communication Communication: No apparent difficulties Factors Affecting Communication: Other (comment) (visually impaired)    Cognition Arousal: Alert Behavior During Therapy: WFL for tasks assessed/performed   PT - Cognitive impairments: No apparent impairments                         Following commands: Intact       Cueing Cueing Techniques: Verbal cues     General Comments      Exercises General Exercises -  Lower Extremity Long Arc Quad: AROM, Both, 15 reps, Seated, Strengthening Hip Flexion/Marching: AROM, Both, 15 reps, Seated, Strengthening   Assessment/Plan    PT Assessment All further PT needs can be met in the next venue of care  PT Problem List Decreased range of motion;Decreased strength;Decreased activity tolerance;Decreased balance;Decreased mobility       PT Treatment Interventions      PT Goals (Current goals can be found in the Care Plan section)  Acute Rehab PT Goals PT Goal Formulation: All assessment and education complete, DC therapy    Frequency       Co-evaluation               AM-PAC PT 6 Clicks Mobility  Outcome Measure Help needed turning from your back to your side while in a flat bed without using bedrails?: A Little Help needed moving from lying on your back to sitting on the side of a flat bed without using bedrails?: A Little Help needed moving to and from a bed to a chair (including a wheelchair)?: A Little Help needed standing up from a chair using your arms (e.g., wheelchair or bedside chair)?: A Little Help needed to walk in hospital room?: Total Help needed climbing 3-5 steps with a railing? : Total 6 Click Score: 14    End of Session   Activity Tolerance: Patient tolerated treatment well Patient left: in chair;with call bell/phone within reach Nurse Communication: Mobility status PT Visit Diagnosis: Other abnormalities of gait and mobility (R26.89);Difficulty in walking, not elsewhere classified (R26.2)    Time: 8745-8683 PT Time Calculation (min) (ACUTE ONLY): 22 min   Charges:   PT Evaluation $PT Eval Low Complexity: 1 Low   PT General Charges $$ ACUTE PT VISIT: 1 Visit         Lenoard SQUIBB, PT Acute Rehabilitation Services Office: [phone]   Lenoard NOVAK Rydan Gulyas 07/26/2023, 1:54 PM

## 2023-07-26 NOTE — Progress Notes (Signed)
 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 O2 sats were 70% then, EMS was called but it appeared that there was some problem with her concentrator.  Once they switched something out they were able to make sure her oxygen was flowing and her O2 sats came back up.   - Still sent to the ED for further evaluation - In the ER noted to be volume overloaded with pulmonary edema and pleural effusions on x-ray, admitted started on diuretics - Complicated by hypotension  Subjective: - Hypotensive episode last night, given albumin and extra midodrine , she denies any new symptoms or complaints  Assessment and Plan:  Acute on chronic combined CHF, RV failure Pulmonary hypertension  Moderate to severe mitral regurgitation - Last echo 2/25 with EF 50-55%, severely reduced RV, moderately elevated PA systolic pressures, moderate TR -Repeat echo with EF down to 45%, severe LVH, reduced RV, moderate to severe mitral regurgitation - Seen by cards during hospitalization in March, palliative care suggested then  - I think her overall prognosis is poor, not a candidate for advanced therapies including valve surgery  - BP soft overnight, transition to oral torsemide  today, continue midodrine  - GDMT limited by CKD, hypotension - Discussed disease burden with patient, palliative care consulted  COPD/chronic respiratory failure -On 4 L home O2 at baseline -Stable, resume albuterol  as needed, does not appear to be on maintenance nebs  OSA/OHS Resume CPAP  History of DVT/PE History of IVC filter -No longer on anticoagulation, on account of GI bleed, diverticular bleeds  History of  abdominal aortic aneurysm  DVT prophylaxis: Heparin  subcutaneous Code Status: DNR Family Communication: None present, will call and update daughter Disposition Plan: back to SNF in 1-2days  Consultants:    Procedures:   Antimicrobials:    Objective: Vitals:   07/26/23 0555 07/26/23 0735 07/26/23 1002 07/26/23 1142  BP: 93/62 114/68 115/78 103/78  Pulse: 89 91  87  Resp: 16 16  14   Temp:  97.6 F (36.4 C)  97.6 F (36.4 C)  TempSrc:  Oral  Oral  SpO2: 95% 94%  97%  Weight:      Height:        Intake/Output Summary (Last 24 hours) at 07/26/2023 1205 Last data filed at 07/26/2023 0500 Gross per 24 hour  Intake 159.91 ml  Output 1875 ml  Net -1715.09 ml   Filed Weights   07/23/23 1644 07/25/23 0514 07/26/23 0345  Weight: 82.9 kg 89.7 kg 88.4 kg    Examination:  General exam: Appears calm and comfortable  HEENT: + JVD Respiratory system: Few scattered rhonchi, decreased breath sounds at the bases Cardiovascular system: S1 & S2 heard, RRR.  Abd: nondistended, soft and nontender.Normal bowel sounds heard. Central nervous system: Alert and oriented. No focal neurological deficits. Extremities: 1+ edema Skin: No rashes Psychiatry:  Mood & affect appropriate.     Data Reviewed:   CBC: Recent Labs  Lab 07/23/23 1812 07/23/23 2007 07/24/23 0619  WBC 6.3  --  5.6  NEUTROABS 5.1  --   --   HGB 10.4* 10.5*  10.5* 9.7*  HCT 33.2* 31.0*  31.0* 31.8*  MCV  98.8  --  99.7  PLT 232  --  224   Basic Metabolic Panel: Recent Labs  Lab 07/23/23 1812 07/23/23 2007 07/24/23 0619 07/25/23 0258 07/26/23 0224  NA 134* 133*  134* 136 132* 134*  K 4.0 4.4  4.3 3.9 3.9 4.0  CL 92* 94* 94* 94* 95*  CO2 32  --  31 30 30   GLUCOSE 107* 93 100* 95 114*  BUN 31* 37* 34* 40* 40*  CREATININE 1.55* 1.60* 1.54* 1.80* 1.63*  CALCIUM  7.8*  --  7.9* 8.0* 7.9*   GFR: Estimated Creatinine Clearance: 25.2 mL/min (A) (by C-G formula based on SCr of 1.63 mg/dL (H)). Liver  Function Tests: Recent Labs  Lab 07/23/23 1812 07/26/23 0224  AST 30  --   ALT 28  --   ALKPHOS 74  --   BILITOT 0.9  --   PROT 5.5*  --   ALBUMIN 2.1* 2.0*   No results for input(s): LIPASE, AMYLASE in the last 168 hours. No results for input(s): AMMONIA in the last 168 hours. Coagulation Profile: No results for input(s): INR, PROTIME in the last 168 hours. Cardiac Enzymes: No results for input(s): CKTOTAL, CKMB, CKMBINDEX, TROPONINI in the last 168 hours. BNP (last 3 results) No results for input(s): PROBNP in the last 8760 hours. HbA1C: No results for input(s): HGBA1C in the last 72 hours. CBG: Recent Labs  Lab 07/23/23 1946  GLUCAP 86   Lipid Profile: No results for input(s): CHOL, HDL, LDLCALC, TRIG, CHOLHDL, LDLDIRECT in the last 72 hours. Thyroid Function Tests: No results for input(s): TSH, T4TOTAL, FREET4, T3FREE, THYROIDAB in the last 72 hours. Anemia Panel: No results for input(s): VITAMINB12, FOLATE, FERRITIN, TIBC, IRON, RETICCTPCT in the last 72 hours. Urine analysis:    Component Value Date/Time   COLORURINE YELLOW 12/31/2020 0100   APPEARANCEUR CLEAR 12/31/2020 0100   LABSPEC 1.020 12/31/2020 0100   PHURINE 6.0 12/31/2020 0100   GLUCOSEU NEGATIVE 12/31/2020 0100   HGBUR NEGATIVE 12/31/2020 0100   BILIRUBINUR NEGATIVE 12/31/2020 0100   KETONESUR NEGATIVE 12/31/2020 0100   PROTEINUR NEGATIVE 12/31/2020 0100   NITRITE POSITIVE (A) 12/31/2020 0100   LEUKOCYTESUR NEGATIVE 12/31/2020 0100   Sepsis Labs: @LABRCNTIP (procalcitonin:4,lacticidven:4)  ) Recent Results (from the past 240 hours)  Blood culture (routine x 2)     Status: None (Preliminary result)   Collection Time: 07/23/23  4:48 PM   Specimen: BLOOD LEFT FOREARM  Result Value Ref Range Status   Specimen Description BLOOD LEFT FOREARM  Final   Special Requests   Final    BOTTLES DRAWN AEROBIC AND ANAEROBIC Blood Culture results may not  be optimal due to an inadequate volume of blood received in culture bottles   Culture   Final    NO GROWTH 3 DAYS Performed at [facility], [address] [place], [place] [address]    Report Status PENDING  Incomplete  Resp panel by RT-PCR (RSV, Flu A&B, Covid) Anterior Nasal Swab     Status: None   Collection Time: 07/23/23  7:35 PM   Specimen: Anterior Nasal Swab  Result Value Ref Range Status   SARS Coronavirus 2 by RT PCR NEGATIVE NEGATIVE Final   Influenza A by PCR NEGATIVE NEGATIVE Final   Influenza B by PCR NEGATIVE NEGATIVE Final    Comment: (NOTE) The Xpert Xpress SARS-CoV-2/FLU/RSV plus assay is intended as an aid in the diagnosis of influenza from Nasopharyngeal swab specimens and should not be used as a sole basis  for treatment. Nasal washings and aspirates are unacceptable for Xpert Xpress SARS-CoV-2/FLU/RSV testing.  Fact Sheet for Patients: BloggerCourse.com  Fact Sheet for Healthcare Providers: SeriousBroker.it  This test is not yet approved or cleared by the [place]  FDA and has been authorized for detection and/or diagnosis of SARS-CoV-2 by FDA under an Emergency Use Authorization (EUA). This EUA will remain in effect (meaning this test can be used) for the duration of the COVID-19 declaration under Section 564(b)(1) of the Act, 21 U.S.C. section 360bbb-3(b)(1), unless the authorization is terminated or revoked.     Resp Syncytial Virus by PCR NEGATIVE NEGATIVE Final    Comment: (NOTE) Fact Sheet for Patients: BloggerCourse.com  Fact Sheet for Healthcare Providers: SeriousBroker.it  This test is not yet approved or cleared by the [place]  FDA and has been authorized for detection and/or diagnosis of SARS-CoV-2 by FDA under an Emergency Use Authorization (EUA). This EUA will remain in effect (meaning this test can be used) for the duration  of the COVID-19 declaration under Section 564(b)(1) of the Act, 21 U.S.C. section 360bbb-3(b)(1), unless the authorization is terminated or revoked.  Performed at [facility], [address] [place], [place] [address]      Radiology Studies: ECHOCARDIOGRAM LIMITED Result Date: 07/24/2023    ECHOCARDIOGRAM LIMITED REPORT   Patient Name:   SEDA KRONBERG Date of Exam: 07/24/2023 Medical Rec #:  [id number]        Height:       66.0 in Accession #:    [id number]       Weight:       182.8 lb Date of Birth:  19-Nov-1930         BSA:          1.925 m Patient Age:    89 years         BP:           97/64 mmHg Patient Gender: F                HR:           98 bpm. Exam Location:  Inpatient Procedure: 2D Echo, Limited Echo and Limited Color Doppler (Both Spectral and            Color Flow Doppler were utilized during procedure). Indications:    Congestive Heart Failure  History:        Patient has prior history of Echocardiogram examinations. CHF,                 CAD, TIA; Risk Factors:Dyslipidemia.  Sonographer:    Christiana Mbomeh Referring Phys: [id number] CLAUDIA CLAIBORNE IMPRESSIONS  1. Left ventricular ejection fraction, by estimation, is 45 to 50%. The left ventricle has mildly decreased function. The left ventricle demonstrates global hypokinesis. There is severe asymmetric left ventricular hypertrophy of the septal segment. There is the interventricular septum is flattened in systole and diastole, consistent with right ventricular pressure and volume overload.  2. Right ventricular systolic function is mildly reduced. The right ventricular size is moderately enlarged.  3. The mitral valve is normal in structure. Moderate to severe mitral valve regurgitation. No evidence of mitral stenosis.  4. Tricuspid valve regurgitation is moderate.  5. The aortic valve is normal in structure. Aortic valve regurgitation is mild. No aortic stenosis is present.  6. The inferior vena cava is normal in size with greater  than 50% respiratory variability, suggesting right atrial pressure of 3 mmHg. FINDINGS  Left Ventricle: Left  ventricular ejection fraction, by estimation, is 45 to 50%. The left ventricle has mildly decreased function. The left ventricle demonstrates global hypokinesis. The left ventricular internal cavity size was normal in size. There is  severe asymmetric left ventricular hypertrophy of the septal segment. The interventricular septum is flattened in systole and diastole, consistent with right ventricular pressure and volume overload. Right Ventricle: The right ventricular size is moderately enlarged. No increase in right ventricular wall thickness. Right ventricular systolic function is mildly reduced. Left Atrium: Left atrial size was normal in size. Right Atrium: Right atrial size was normal in size. Pericardium: There is no evidence of pericardial effusion. Mitral Valve: The mitral valve is normal in structure. Moderate to severe mitral valve regurgitation. No evidence of mitral valve stenosis. Tricuspid Valve: The tricuspid valve is normal in structure. Tricuspid valve regurgitation is moderate . No evidence of tricuspid stenosis. Aortic Valve: The aortic valve is normal in structure. Aortic valve regurgitation is mild. No aortic stenosis is present. Pulmonic Valve: The pulmonic valve was normal in structure. Pulmonic valve regurgitation is not visualized. No evidence of pulmonic stenosis. Aorta: The aortic root is normal in size and structure. Venous: The inferior vena cava is normal in size with greater than 50% respiratory variability, suggesting right atrial pressure of 3 mmHg. IAS/Shunts: No atrial level shunt detected by color flow Doppler. Additional Comments: A device lead is visualized.  LEFT VENTRICLE PLAX 2D LVIDd:         4.20 cm LVIDs:         3.00 cm LV PW:         1.30 cm LV IVS:        1.60 cm LVOT diam:     2.10 cm LV SV:         30 LV SV Index:   16 LVOT Area:     3.46 cm  RIGHT VENTRICLE             IVC RV FAC:         13.3 %     IVC diam: 2.20 cm RV S prime:     8.24 cm/s TAPSE (M-mode): 1.4 cm LEFT ATRIUM             Index        RIGHT ATRIUM           Index LA Vol (A2C):   59.6 ml 30.97 ml/m  RA Area:     22.50 cm LA Vol (A4C):   35.5 ml 18.44 ml/m  RA Volume:   65.60 ml  34.08 ml/m LA Biplane Vol: 46.0 ml 23.90 ml/m  AORTIC VALVE LVOT Vmax:   65.53 cm/s LVOT Vmean:  39.867 cm/s LVOT VTI:    0.088 m TRICUSPID VALVE TR Peak grad:   62.4 mmHg TR Vmax:        395.00 cm/s  SHUNTS Systemic VTI:  0.09 m Systemic Diam: 2.10 cm Kardie Tobb DO Electronically signed by Kardie Tobb DO Signature Date/Time: 07/24/2023/2:59:02 PM    Final      Scheduled Meds:  aspirin  EC  81 mg Oral q AM   atorvastatin   40 mg Oral QHS   docusate sodium   200 mg Oral q AM   fluticasone  furoate-vilanterol  1 puff Inhalation Daily   guaiFENesin   10 mL Oral TID   heparin  injection (subcutaneous)  5,000 Units Subcutaneous Q8H   leptospermum manuka honey  1 Application Topical Daily   loratadine   10 mg Oral Daily  melatonin  9 mg Oral QHS   mesalamine   1,000 mg Oral BID   midodrine   10 mg Oral TID WC   pantoprazole   40 mg Oral BID   saccharomyces boulardii  250 mg Oral BID   torsemide   20 mg Oral Daily   Continuous Infusions:   LOS: 3 days    Time spent:    Sigurd Pac, MD [facility]   07/26/2023, 12:05 PM

## 2023-07-26 NOTE — Progress Notes (Signed)
 PT Cancellation Note  Patient Details Name: Felicia Benson MRN: [id number] DOB: 07-08-1930   Cancelled Treatment:    Reason Eval/Treat Not Completed: Patient declined, no reason specified (pt stating fatigue and not agreeable to mobility at this time)   Lenoard KATHEE Docker 07/26/2023, 9:48 AM Lenoard SQUIBB, PT Acute Rehabilitation Services Office: [phone]

## 2023-07-26 NOTE — Progress Notes (Deleted)
 Mobility Specialist Progress Note:    07/26/23 1451  Mobility  Activity Ambulated with assistance in hallway  Level of Assistance Minimal assist, patient does 75% or more  Assistive Device Front wheel walker  Distance Ambulated (ft) 150 ft  Activity Response Tolerated well  Mobility Referral Yes  Mobility visit 1 Mobility  Mobility Specialist Start Time (ACUTE ONLY) 1451  Mobility Specialist Stop Time (ACUTE ONLY) 1505  Mobility Specialist Time Calculation (min) (ACUTE ONLY) 14 min   Received pt in recliner w/ c/o of hip pain and wanting to get back in bed. Pt was able to stand w/ assist and step to get back to bed from the recliner. Pt needed assistance lifting her legs completely back in bed. Replaced old purewick for pt. Pt left in bed w/ all needs met. Alerted RN that pt requested tylenol .   [name] Mobility Specialist Please Neurosurgeon or Rehab Office at [phone]

## 2023-07-26 NOTE — Progress Notes (Signed)
 Mobility Specialist Progress Note:    07/26/23 1451  Mobility  Activity Transferred from chair to bed  Level of Assistance Moderate assist, patient does 50-74%  Assistive Device Front wheel walker  Distance Ambulated (ft) 3 ft  Activity Response Tolerated well  Mobility Referral Yes  Mobility visit 1 Mobility  Mobility Specialist Start Time (ACUTE ONLY) 1451  Mobility Specialist Stop Time (ACUTE ONLY) 1505  Mobility Specialist Time Calculation (min) (ACUTE ONLY) 14 min   Received pt in recliner w/ c/o of hip pain and wanting to get back in bed. Pt was able to stand w/ assist and step to get back to bed from the recliner. Pt needed assistance lifting her legs completely back in bed. Replaced old purewick for pt. Pt left in bed w/ all needs met. Alerted RN that pt requested tylenol .   [name] Mobility Specialist Please Contact via SecureChat or Rehab Office at [phone]

## 2023-07-26 NOTE — TOC CM/SW Note (Deleted)
 Transition of Care [facility]) - Inpatient Brief Assessment   Patient Details  Name: Felicia Benson MRN: [id number] Date of Birth: 05-Aug-1930  Transition of Care [facility]) CM/SW Contact:    Waddell Barnie Rama, RN Phone Number: 07/26/2023, 11:44 AM   Clinical Narrative: From home with spouse, has PCP and insurance on file, states has no HH services in place at this time or DME at home.  States wife will transport them home at [employer] and she is support system, states gets medications from [facility] on [address]  Pta self ambulatory.   There are no TOC needs identified  at this time.  Please place consult for TOC needs.     Transition of Care Asessment: Insurance and Status: Insurance coverage has been reviewed Patient has primary care physician: Yes Home environment has been reviewed: home with wife Prior level of function:: indep Prior/Current Home Services: No current home services Social Drivers of Health Review: SDOH reviewed no interventions necessary Readmission risk has been reviewed: Yes Transition of care needs: no transition of care needs at this time

## 2023-07-27 DIAGNOSIS — I5043 Acute on chronic combined systolic (congestive) and diastolic (congestive) heart failure: Secondary | ICD-10-CM | POA: Diagnosis not present

## 2023-07-27 LAB — BASIC METABOLIC PANEL WITH GFR
Anion gap: 6 (ref 5–15)
BUN: 43 mg/dL — ABNORMAL HIGH (ref 8–23)
CO2: 34 mmol/L — ABNORMAL HIGH (ref 22–32)
Calcium: 8.1 mg/dL — ABNORMAL LOW (ref 8.9–10.3)
Chloride: 95 mmol/L — ABNORMAL LOW (ref 98–111)
Creatinine, Ser: 1.73 mg/dL — ABNORMAL HIGH (ref 0.44–1.00)
GFR, Estimated: 28 mL/min — ABNORMAL LOW (ref >60)
Glucose, Bld: 91 mg/dL (ref 70–99)
Potassium: 4.1 mmol/L (ref 3.5–5.1)
Sodium: 135 mmol/L (ref 135–145)

## 2023-07-27 MED ORDER — FUROSEMIDE 10 MG/ML IJ SOLN
40.0000 mg | Freq: Two times a day (BID) | INTRAMUSCULAR | Status: AC
  Administered 2023-07-27 (×2): 40 mg via INTRAVENOUS
  Filled 2023-07-27 (×2): qty 4

## 2023-07-27 NOTE — Progress Notes (Signed)
 Mobility Specialist Progress Note;   07/27/23 1000  Mobility  Activity Ambulated with assistance in room  Level of Assistance Minimal assist, patient does 75% or more  Assistive Device Front wheel walker  Distance Ambulated (ft) 6 ft  Activity Response Tolerated well  Mobility Referral Yes  Mobility visit 1 Mobility  Mobility Specialist Start Time (ACUTE ONLY) 1000  Mobility Specialist Stop Time (ACUTE ONLY) 1015  Mobility Specialist Time Calculation (min) (ACUTE ONLY) 15 min   Pt agreeable to limited mobility. Deferred sitting in the chair this AM, however agreeable to get OOB and take a few steps. Required MinA for bed mobility and MinG during short bout of ambulation. VSS on 6LO2, SPO2 93%>. Pt returned comfortably back to bed and left with all needs met, call bell in reach. Alarm on.    Lauraine Erm Mobility Specialist Please contact via SecureChat or [employer] [phone]

## 2023-07-27 NOTE — Progress Notes (Signed)
 PROGRESS NOTE    PATRIZIA PAULE  FMW:[id number] DOB: 1930-11-01 DOA: 07/23/2023 PCP: Patient, No Pcp Per    89/F w AAA, diastolic CHF, COPD on chronically 4 L O2 nasal cannula, 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 O2 sats were 70% then, EMS was called but it appeared that there was some problem with her concentrator.  Once they switched something out they were able to make sure her oxygen was flowing and her O2 sats came back up.   - Still sent to the ED for further evaluation - In the ER noted to be volume overloaded with pulmonary edema and pleural effusions on x-ray, admitted started on diuretics - Complicated by hypotension, midodrine  dose increased  Subjective: - Feels better today, blood pressure more stable  Assessment and Plan:  Acute on chronic combined CHF, RV failure Pulmonary hypertension  Moderate to severe mitral regurgitation - Last echo 2/25 with EF 50-55%, severely reduced RV, moderately elevated PA systolic pressures, moderate TR -Repeat echo with EF down to 45%, severe LVH, reduced RV, moderate to severe mitral regurgitation - Seen by cards during hospitalization in March, palliative care suggested then  - I think her overall prognosis is poor, not a candidate for advanced therapies including valve surgery  - Remains volume overloaded, blood pressure is more stable, restart IV Lasix , continue midodrine  - GDMT limited by CKD, hypotension - Discussed disease burden with patient and daughter, palliative care consulted> patient declines hospice services at this time, plan for outpatient palliative care  COPD/chronic respiratory failure -On 4 L home O2 at baseline -Stable, resume albuterol  as needed, does not appear to be on maintenance nebs - Considering her chronic cough will check SLP eval  OSA/OHS Resume  CPAP  History of DVT/PE History of IVC filter -No longer on anticoagulation, on account of GI bleed, diverticular bleeds  History of abdominal aortic aneurysm  DVT prophylaxis: Heparin  subcutaneous Code Status: DNR Family Communication: Discussed with daughter Disposition Plan: back to SNF likely Monday  Consultants:    Procedures:   Antimicrobials:    Objective: Vitals:   07/27/23 0416 07/27/23 0500 07/27/23 0747 07/27/23 0752  BP: 108/75  133/86   Pulse: 81  89   Resp: 20  20   Temp:   (!) 96.5 F (35.8 C)   TempSrc: Oral  Axillary   SpO2: 99%  92% 96%  Weight:  90 kg    Height:        Intake/Output Summary (Last 24 hours) at 07/27/2023 1040 Last data filed at 07/27/2023 0931 Gross per 24 hour  Intake 240 ml  Output --  Net 240 ml   Filed Weights   07/25/23 0514 07/26/23 0345 07/27/23 0500  Weight: 89.7 kg 88.4 kg 90 kg    Examination:  General exam: Appears calm and comfortable, AAO x 3 HEENT: + JVD Respiratory system: Few basilar Rales, scattered rhonchi Cardiovascular system: S1 & S2 heard, RRR.  Abd: nondistended, soft and nontender.Normal bowel sounds heard. Central nervous system: Alert and oriented. No focal neurological deficits. Extremities: 1+ edema, chronic skin changes Skin: No rashes Psychiatry:  Mood & affect appropriate.     Data Reviewed:   CBC: Recent Labs  Lab 07/23/23 1812 07/23/23 2007 07/24/23 0619  WBC 6.3  --  5.6  NEUTROABS 5.1  --   --  HGB 10.4* 10.5*  10.5* 9.7*  HCT 33.2* 31.0*  31.0* 31.8*  MCV 98.8  --  99.7  PLT 232  --  224   Basic Metabolic Panel: Recent Labs  Lab 07/23/23 1812 07/23/23 2007 07/24/23 0619 07/25/23 0258 07/26/23 0224 07/27/23 0248  NA 134* 133*  134* 136 132* 134* 135  K 4.0 4.4  4.3 3.9 3.9 4.0 4.1  CL 92* 94* 94* 94* 95* 95*  CO2 32  --  31 30 30  34*  GLUCOSE 107* 93 100* 95 114* 91  BUN 31* 37* 34* 40* 40* 43*  CREATININE 1.55* 1.60* 1.54* 1.80* 1.63* 1.73*  CALCIUM   7.8*  --  7.9* 8.0* 7.9* 8.1*   GFR: Estimated Creatinine Clearance: 23.9 mL/min (A) (by C-G formula based on SCr of 1.73 mg/dL (H)). Liver Function Tests: Recent Labs  Lab 07/23/23 1812 07/26/23 0224  AST 30  --   ALT 28  --   ALKPHOS 74  --   BILITOT 0.9  --   PROT 5.5*  --   ALBUMIN  2.1* 2.0*   No results for input(s): LIPASE, AMYLASE in the last 168 hours. No results for input(s): AMMONIA in the last 168 hours. Coagulation Profile: No results for input(s): INR, PROTIME in the last 168 hours. Cardiac Enzymes: No results for input(s): CKTOTAL, CKMB, CKMBINDEX, TROPONINI in the last 168 hours. BNP (last 3 results) No results for input(s): PROBNP in the last 8760 hours. HbA1C: No results for input(s): HGBA1C in the last 72 hours. CBG: Recent Labs  Lab 07/23/23 1946  GLUCAP 86   Lipid Profile: No results for input(s): CHOL, HDL, LDLCALC, TRIG, CHOLHDL, LDLDIRECT in the last 72 hours. Thyroid Function Tests: No results for input(s): TSH, T4TOTAL, FREET4, T3FREE, THYROIDAB in the last 72 hours. Anemia Panel: No results for input(s): VITAMINB12, FOLATE, FERRITIN, TIBC, IRON, RETICCTPCT in the last 72 hours. Urine analysis:    Component Value Date/Time   COLORURINE YELLOW 12/31/2020 0100   APPEARANCEUR CLEAR 12/31/2020 0100   LABSPEC 1.020 12/31/2020 0100   PHURINE 6.0 12/31/2020 0100   GLUCOSEU NEGATIVE 12/31/2020 0100   HGBUR NEGATIVE 12/31/2020 0100   BILIRUBINUR NEGATIVE 12/31/2020 0100   KETONESUR NEGATIVE 12/31/2020 0100   PROTEINUR NEGATIVE 12/31/2020 0100   NITRITE POSITIVE (A) 12/31/2020 0100   LEUKOCYTESUR NEGATIVE 12/31/2020 0100   Sepsis Labs: @LABRCNTIP (procalcitonin:4,lacticidven:4)  ) Recent Results (from the past 240 hours)  Blood culture (routine x 2)     Status: None (Preliminary result)   Collection Time: 07/23/23  4:48 PM   Specimen: BLOOD LEFT FOREARM  Result Value Ref Range Status    Specimen Description BLOOD LEFT FOREARM  Final   Special Requests   Final    BOTTLES DRAWN AEROBIC AND ANAEROBIC Blood Culture results may not be optimal due to an inadequate volume of blood received in culture bottles   Culture   Final    NO GROWTH 4 DAYS Performed at [facility], [address], [place], [place] [address]    Report Status PENDING  Incomplete  Resp panel by RT-PCR (RSV, Flu A&B, Covid) Anterior Nasal Swab     Status: None   Collection Time: 07/23/23  7:35 PM   Specimen: Anterior Nasal Swab  Result Value Ref Range Status   SARS Coronavirus 2 by RT PCR NEGATIVE NEGATIVE Final   Influenza A by PCR NEGATIVE NEGATIVE Final   Influenza B by PCR NEGATIVE NEGATIVE Final    Comment: (NOTE) The Xpert Xpress SARS-CoV-2/FLU/RSV  plus assay is intended as an aid in the diagnosis of influenza from Nasopharyngeal swab specimens and should not be used as a sole basis for treatment. Nasal washings and aspirates are unacceptable for Xpert Xpress SARS-CoV-2/FLU/RSV testing.  Fact Sheet for Patients: BloggerCourse.com  Fact Sheet for Healthcare Providers: SeriousBroker.it  This test is not yet approved or cleared by the [place]  FDA and has been authorized for detection and/or diagnosis of SARS-CoV-2 by FDA under an Emergency Use Authorization (EUA). This EUA will remain in effect (meaning this test can be used) for the duration of the COVID-19 declaration under Section 564(b)(1) of the Act, 21 U.S.C. section 360bbb-3(b)(1), unless the authorization is terminated or revoked.     Resp Syncytial Virus by PCR NEGATIVE NEGATIVE Final    Comment: (NOTE) Fact Sheet for Patients: BloggerCourse.com  Fact Sheet for Healthcare Providers: SeriousBroker.it  This test is not yet approved or cleared by the [place]  FDA and has been authorized for detection and/or diagnosis  of SARS-CoV-2 by FDA under an Emergency Use Authorization (EUA). This EUA will remain in effect (meaning this test can be used) for the duration of the COVID-19 declaration under Section 564(b)(1) of the Act, 21 U.S.C. section 360bbb-3(b)(1), unless the authorization is terminated or revoked.  Performed at [facility], [address] [place], [place] [address]      Radiology Studies: No results found.    Scheduled Meds:  aspirin  EC  81 mg Oral q AM   atorvastatin   40 mg Oral QHS   docusate sodium   200 mg Oral q AM   fluticasone  furoate-vilanterol  1 puff Inhalation Daily   furosemide   40 mg Intravenous BID   guaiFENesin   10 mL Oral TID   heparin  injection (subcutaneous)  5,000 Units Subcutaneous Q8H   leptospermum manuka honey  1 Application Topical Daily   loratadine   10 mg Oral Daily   melatonin  9 mg Oral QHS   mesalamine   1,000 mg Oral BID   midodrine   10 mg Oral TID WC   pantoprazole   40 mg Oral BID   saccharomyces boulardii  250 mg Oral BID   Continuous Infusions:   LOS: 4 days    Time spent:    Sigurd Pac, MD [facility]   07/27/2023, 10:40 AM

## 2023-07-27 NOTE — Plan of Care (Signed)

## 2023-07-27 NOTE — Progress Notes (Signed)
 Patient refused CPAP for the night

## 2023-07-27 NOTE — Evaluation (Signed)
 Clinical/Bedside Swallow Evaluation Patient Details  Name: Felicia Benson MRN: [id number] Date of Birth: 1930-02-14  Today's Date: 07/27/2023 Time: SLP Start Time (ACUTE ONLY): 1350 SLP Stop Time (ACUTE ONLY): 1410 SLP Time Calculation (min) (ACUTE ONLY): 20 min  Past Medical History:  Past Medical History:  Diagnosis Date   AAA (abdominal aortic aneurysm) (HCC)    CHF (congestive heart failure) (HCC)    Crohn's disease (HCC)    Diverticulitis    Diverticulosis    GERD (gastroesophageal reflux disease)    GI bleed    Hypertension    OSA (obstructive sleep apnea)    Pacemaker    Thyroid nodule    Past Surgical History:  Past Surgical History:  Procedure Laterality Date   CHOLECYSTECTOMY     COLONOSCOPY WITH PROPOFOL  N/A 06/01/2015   Procedure: COLONOSCOPY WITH PROPOFOL ;  Surgeon: Jerrell Sol, MD;  Location: [facility] ENDOSCOPY;  Service: Endoscopy;  Laterality: N/A;   CORONARY ANGIOPLASTY WITH STENT PLACEMENT     ESOPHAGOGASTRODUODENOSCOPY (EGD) WITH PROPOFOL  N/A 06/01/2015   Procedure: ESOPHAGOGASTRODUODENOSCOPY (EGD) WITH PROPOFOL ;  Surgeon: Jerrell Sol, MD;  Location: [facility] ENDOSCOPY;  Service: Endoscopy;  Laterality: N/A;   GIVENS CAPSULE STUDY N/A 06/03/2015   Procedure: GIVENS CAPSULE STUDY;  Surgeon: Jerrell Sol, MD;  Location: [facility] ENDOSCOPY;  Service: Endoscopy;  Laterality: N/A;   INTRAMEDULLARY (IM) NAIL INTERTROCHANTERIC Right 10/30/2022   Procedure: INTRAMEDULLARY nailing of right femur;  Surgeon: Edna Toribio LABOR, MD;  Location: [facility] OR;  Service: Orthopedics;  Laterality: Right;   IR IVC FILTER PLMT / S&I /IMG GUID/MOD SED  02/11/2023   PACEMAKER PLACEMENT  2015   HPI:  89 yo female adm to [facility] with weakness, reports chronic cough x2 months. Pt stays at [facility] and has PMH + for COPD, GERD, Crohn's, DVT s/p IVC filter, OSA, GIB, ielus post surgery, right femur fx IM surgery 11/2022, fall 03/2023, allergic rhinitis and she reports TIA.  Swallow eval  ordered - presumed due to pt having ongoing breathing difficulties.  Pt CXR showed cervical hardware, patchy ASD, tortuous aorta.  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 She reports having undergone prior swallowing test *approx six years prior at OP facility* presumed for esophagus - without significant finding.  MBS 01/2023 conducted with age related swallowing - penetration of thin, no aspiration and minimal retention - rec reg/thin. Pt denies dysphagia but does have xerostomia as she i a mouth breather.    Assessment / Plan / Recommendation  Clinical Impression  Functional oropharyngeal swallow ability noted without evidence of aspiration or dysphagia across all po observed. Pt conducted 3 ounce Yale swallow screen easily, ate approx 4 ounces applesauce and two graham cracker. Swallow judged to be timely without indication of retention.  Pt does have h/o GERD for which she takes reflux medication and denies it being an issue unless she eats too much.  Reviewed prior MBS results with pt from 01/2023.  Swallow/respiratory reciprocity was [facility].  Given she reports xerostomia, recommended she start all intake with liquids.  Recommend reg/thin diet - No SLP follow up indicated.   Education completed and pt agreeable to plan. SLP Visit Diagnosis: Dysphagia, unspecified (R13.10)    Aspiration Risk  No limitations    Diet Recommendation Regular;Thin liquid    Liquid Administration via: Cup;Straw Medication Administration: Whole meds with liquid Supervision: Patient able to self feed Compensations: Slow rate;Small sips/bites (start intake with liquids) Postural Changes: Seated upright at 90 degrees;Remain upright for at  least 30 minutes after po intake    Other  Recommendations Oral Care Recommendations: Oral care BID     Assistance Recommended at Discharge    Functional Status Assessment Patient has not had a recent decline in their functional status  Frequency and Duration             Prognosis        Swallow Study   General Date of Onset: 07/27/23 HPI: 89 yo female adm to [facility] with weakness, reports chronic cough x2 months. Pt stays at [facility] and has PMH + for COPD, GERD, Crohn's, DVT s/p IVC filter, OSA, GIB, ielus post surgery, right femur fx IM surgery 11/2022, fall 03/2023, allergic rhinitis and she reports TIA.  Swallow eval ordered - presumed due to pt having ongoing breathing difficulties.  Pt CXR showed cervical hardware, patchy ASD, tortuous aorta.  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 She reports having undergone prior swallowing test *approx six years prior at OP facility* presumed for esophagus - without significant finding.  MBS 01/2023 conducted with age related swallowing - penetration of thin, no aspiration and minimal retention - rec reg/thin. Pt denies dysphagia but does have xerostomia as she i a mouth breather. Type of Study: Bedside Swallow Evaluation Previous Swallow Assessment: see HPI Diet Prior to this Study: Regular;Thin liquids (Level 0) Temperature Spikes Noted: No Respiratory Status: Nasal cannula History of Recent Intubation: No Behavior/Cognition: Alert;Cooperative;Pleasant mood Oral Cavity Assessment: Within Functional Limits Oral Care Completed by SLP: No Oral Cavity - Dentition: Dentures, bottom;Dentures, top Vision: Functional for self-feeding Self-Feeding Abilities: Able to feed self Patient Positioning: Upright in bed Baseline Vocal Quality: Hoarse;Other (comment) (pt reports due to coughing) Volitional Cough: Strong Volitional Swallow:  (DNT)    Oral/Motor/Sensory Function Overall Oral Motor/Sensory Function: Within functional limits   Ice Chips Ice chips: Not tested   Thin Liquid Thin Liquid: Within functional limits Presentation: Cup;Self Fed;Straw    Nectar Thick Nectar Thick Liquid: Not tested   Honey Thick Honey Thick Liquid: Not tested   Puree Puree: Within functional limits Presentation: Self  Fed;Spoon   Solid     Solid: Within functional limits Presentation: Self Fed      Nicolas Emmie Caldron 07/27/2023,2:18 PM Madelin POUR, MS [facility] SLP Acute Rehab Services Office [phone]

## 2023-07-28 DIAGNOSIS — I5043 Acute on chronic combined systolic (congestive) and diastolic (congestive) heart failure: Secondary | ICD-10-CM | POA: Diagnosis not present

## 2023-07-28 LAB — BASIC METABOLIC PANEL WITH GFR
Anion gap: 9 (ref 5–15)
BUN: 42 mg/dL — ABNORMAL HIGH (ref 8–23)
CO2: 33 mmol/L — ABNORMAL HIGH (ref 22–32)
Calcium: 8 mg/dL — ABNORMAL LOW (ref 8.9–10.3)
Chloride: 93 mmol/L — ABNORMAL LOW (ref 98–111)
Creatinine, Ser: 1.62 mg/dL — ABNORMAL HIGH (ref 0.44–1.00)
GFR, Estimated: 30 mL/min — ABNORMAL LOW (ref >60)
Glucose, Bld: 103 mg/dL — ABNORMAL HIGH (ref 70–99)
Potassium: 4.1 mmol/L (ref 3.5–5.1)
Sodium: 135 mmol/L (ref 135–145)

## 2023-07-28 LAB — CULTURE, BLOOD (ROUTINE X 2): Culture: NO GROWTH

## 2023-07-28 MED ORDER — FUROSEMIDE 10 MG/ML IJ SOLN
40.0000 mg | Freq: Two times a day (BID) | INTRAMUSCULAR | Status: AC
  Administered 2023-07-28 (×2): 40 mg via INTRAVENOUS
  Filled 2023-07-28 (×2): qty 4

## 2023-07-28 NOTE — Progress Notes (Signed)
 PROGRESS NOTE    Felicia Benson  FMW:[id number] DOB: 1930-04-15 DOA: 07/23/2023 PCP: Patient, No Pcp Per    89/F w AAA, diastolic CHF, COPD on chronically 4 L O2 nasal cannula, 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 O2 sats were 70% then, EMS was called but it appeared that there was some problem with her concentrator.  Once they switched something out they were able to make sure her oxygen was flowing and her O2 sats came back up.   - Still sent to the ED for further evaluation - In the ER noted to be volume overloaded with pulmonary edema and pleural effusions on x-ray, admitted started on diuretics - Complicated by hypotension, midodrine  dose increased - Echo noted slight worsening cardiomyopathy, worsening mitral regurgitation - Palliative consulted  Subjective: - Feels better overall, continues to have chronic cough  Assessment and Plan:  Acute on chronic combined CHF, RV failure Pulmonary hypertension  Moderate to severe mitral regurgitation - Last echo 2/25 with EF 50-55%, severely reduced RV, moderately elevated PA systolic pressures, moderate TR -Repeat echo with EF down to 45%, severe LVH, reduced RV, moderate to severe mitral regurgitation - Seen by cards during hospitalization in March, palliative care suggested then  - I think her overall prognosis is poor, not a candidate for advanced therapies including valve surgery  - Volume status improving, continue IV Lasix  today, continue midodrine  blood pressure more stable now  - GDMT limited by CKD, hypotension - Discussed disease burden with patient and daughter, palliative care consulted> patient declines hospice services at this time, plan for outpatient palliative care - Discharge planning, back to SNF if stable  COPD/chronic respiratory failure -On 4 L home O2 at  baseline -Stable, resume albuterol  as needed, does not appear to be on maintenance nebs - SLP eval appreciated, no significant dysphagia  OSA/OHS Resume CPAP  History of DVT/PE History of IVC filter -No longer on anticoagulation, on account of GI bleed, diverticular bleeds  History of abdominal aortic aneurysm  DVT prophylaxis: Heparin  subcutaneous Code Status: DNR Family Communication: Discussed with daughter yesterday Disposition Plan: back to SNF likely Monday  Consultants:    Procedures:   Antimicrobials:    Objective: Vitals:   07/28/23 0459 07/28/23 0600 07/28/23 0741 07/28/23 0851  BP: 116/66  121/68   Pulse: 80  86 80  Resp: 14  18 18   Temp: 97.8 F (36.6 C)  97.8 F (36.6 C)   TempSrc: Oral  Oral   SpO2: 94%  97% 95%  Weight:  89.5 kg    Height:        Intake/Output Summary (Last 24 hours) at 07/28/2023 1025 Last data filed at 07/28/2023 0600 Gross per 24 hour  Intake 120 ml  Output 1850 ml  Net -1730 ml   Filed Weights   07/27/23 0500 07/27/23 2338 07/28/23 0600  Weight: 90 kg 89.6 kg 89.5 kg    Examination:  General exam: Appears calm and comfortable, AAO x 3 HEENT: Positive JVD Respiratory system: Few basilar Rales, occasional scattered rhonchi Cardiovascular system: S1 & S2 heard, RRR.  Abd: nondistended, soft and nontender.Normal bowel sounds heard. Central nervous system: Alert and oriented. No focal neurological deficits. Extremities: Trace edema, Skin: No rashes, chronic skin changes Psychiatry:  Mood & affect appropriate.     Data Reviewed:   CBC: Recent  Labs  Lab 07/23/23 1812 07/23/23 2007 07/24/23 0619  WBC 6.3  --  5.6  NEUTROABS 5.1  --   --   HGB 10.4* 10.5*  10.5* 9.7*  HCT 33.2* 31.0*  31.0* 31.8*  MCV 98.8  --  99.7  PLT 232  --  224   Basic Metabolic Panel: Recent Labs  Lab 07/24/23 0619 07/25/23 0258 07/26/23 0224 07/27/23 0248 07/28/23 0336  NA 136 132* 134* 135 135  K 3.9 3.9 4.0 4.1 4.1  CL 94*  94* 95* 95* 93*  CO2 31 30 30  34* 33*  GLUCOSE 100* 95 114* 91 103*  BUN 34* 40* 40* 43* 42*  CREATININE 1.54* 1.80* 1.63* 1.73* 1.62*  CALCIUM  7.9* 8.0* 7.9* 8.1* 8.0*   GFR: Estimated Creatinine Clearance: 25.5 mL/min (A) (by C-G formula based on SCr of 1.62 mg/dL (H)). Liver Function Tests: Recent Labs  Lab 07/23/23 1812 07/26/23 0224  AST 30  --   ALT 28  --   ALKPHOS 74  --   BILITOT 0.9  --   PROT 5.5*  --   ALBUMIN  2.1* 2.0*   No results for input(s): LIPASE, AMYLASE in the last 168 hours. No results for input(s): AMMONIA in the last 168 hours. Coagulation Profile: No results for input(s): INR, PROTIME in the last 168 hours. Cardiac Enzymes: No results for input(s): CKTOTAL, CKMB, CKMBINDEX, TROPONINI in the last 168 hours. BNP (last 3 results) No results for input(s): PROBNP in the last 8760 hours. HbA1C: No results for input(s): HGBA1C in the last 72 hours. CBG: Recent Labs  Lab 07/23/23 1946  GLUCAP 86   Lipid Profile: No results for input(s): CHOL, HDL, LDLCALC, TRIG, CHOLHDL, LDLDIRECT in the last 72 hours. Thyroid Function Tests: No results for input(s): TSH, T4TOTAL, FREET4, T3FREE, THYROIDAB in the last 72 hours. Anemia Panel: No results for input(s): VITAMINB12, FOLATE, FERRITIN, TIBC, IRON, RETICCTPCT in the last 72 hours. Urine analysis:    Component Value Date/Time   COLORURINE YELLOW 12/31/2020 0100   APPEARANCEUR CLEAR 12/31/2020 0100   LABSPEC 1.020 12/31/2020 0100   PHURINE 6.0 12/31/2020 0100   GLUCOSEU NEGATIVE 12/31/2020 0100   HGBUR NEGATIVE 12/31/2020 0100   BILIRUBINUR NEGATIVE 12/31/2020 0100   KETONESUR NEGATIVE 12/31/2020 0100   PROTEINUR NEGATIVE 12/31/2020 0100   NITRITE POSITIVE (A) 12/31/2020 0100   LEUKOCYTESUR NEGATIVE 12/31/2020 0100   Sepsis Labs: @LABRCNTIP (procalcitonin:4,lacticidven:4)  ) Recent Results (from the past 240 hours)  Blood culture (routine x 2)      Status: None   Collection Time: 07/23/23  4:48 PM   Specimen: BLOOD LEFT FOREARM  Result Value Ref Range Status   Specimen Description BLOOD LEFT FOREARM  Final   Special Requests   Final    BOTTLES DRAWN AEROBIC AND ANAEROBIC Blood Culture results may not be optimal due to an inadequate volume of blood received in culture bottles   Culture   Final    NO GROWTH 5 DAYS Performed at [facility], [address], [place], [place] [address]    Report Status 07/28/2023 FINAL  Final  Resp panel by RT-PCR (RSV, Flu A&B, Covid) Anterior Nasal Swab     Status: None   Collection Time: 07/23/23  7:35 PM   Specimen: Anterior Nasal Swab  Result Value Ref Range Status   SARS Coronavirus 2 by RT PCR NEGATIVE NEGATIVE Final   Influenza A by PCR NEGATIVE NEGATIVE Final   Influenza B by PCR NEGATIVE NEGATIVE Final  Comment: (NOTE) The Xpert Xpress SARS-CoV-2/FLU/RSV plus assay is intended as an aid in the diagnosis of influenza from Nasopharyngeal swab specimens and should not be used as a sole basis for treatment. Nasal washings and aspirates are unacceptable for Xpert Xpress SARS-CoV-2/FLU/RSV testing.  Fact Sheet for Patients: BloggerCourse.com  Fact Sheet for Healthcare Providers: SeriousBroker.it  This test is not yet approved or cleared by the [place]  FDA and has been authorized for detection and/or diagnosis of SARS-CoV-2 by FDA under an Emergency Use Authorization (EUA). This EUA will remain in effect (meaning this test can be used) for the duration of the COVID-19 declaration under Section 564(b)(1) of the Act, 21 U.S.C. section 360bbb-3(b)(1), unless the authorization is terminated or revoked.     Resp Syncytial Virus by PCR NEGATIVE NEGATIVE Final    Comment: (NOTE) Fact Sheet for Patients: BloggerCourse.com  Fact Sheet for Healthcare  Providers: SeriousBroker.it  This test is not yet approved or cleared by the [place]  FDA and has been authorized for detection and/or diagnosis of SARS-CoV-2 by FDA under an Emergency Use Authorization (EUA). This EUA will remain in effect (meaning this test can be used) for the duration of the COVID-19 declaration under Section 564(b)(1) of the Act, 21 U.S.C. section 360bbb-3(b)(1), unless the authorization is terminated or revoked.  Performed at [facility], [address], [place], [place] [address]      Radiology Studies: No results found.    Scheduled Meds:  aspirin  EC  81 mg Oral q AM   atorvastatin   40 mg Oral QHS   docusate sodium   200 mg Oral q AM   fluticasone  furoate-vilanterol  1 puff Inhalation Daily   guaiFENesin   10 mL Oral TID   heparin  injection (subcutaneous)  5,000 Units Subcutaneous Q8H   leptospermum manuka honey  1 Application Topical Daily   loratadine   10 mg Oral Daily   melatonin  9 mg Oral QHS   mesalamine   1,000 mg Oral BID   midodrine   10 mg Oral TID WC   pantoprazole   40 mg Oral BID   saccharomyces boulardii  250 mg Oral BID   Continuous Infusions:   LOS: 5 days    Time spent:    Sigurd Pac, MD [facility]   07/28/2023, 10:25 AM

## 2023-07-28 NOTE — Progress Notes (Signed)
   07/28/23 2240  BiPAP/CPAP/SIPAP  Reason BIPAP/CPAP not in use Non-compliant (Pt continues to refuse)

## 2023-07-29 DIAGNOSIS — I5043 Acute on chronic combined systolic (congestive) and diastolic (congestive) heart failure: Secondary | ICD-10-CM | POA: Diagnosis not present

## 2023-07-29 LAB — BASIC METABOLIC PANEL WITH GFR
Anion gap: 9 (ref 5–15)
BUN: 38 mg/dL — ABNORMAL HIGH (ref 8–23)
CO2: 32 mmol/L (ref 22–32)
Calcium: 8.2 mg/dL — ABNORMAL LOW (ref 8.9–10.3)
Chloride: 95 mmol/L — ABNORMAL LOW (ref 98–111)
Creatinine, Ser: 1.55 mg/dL — ABNORMAL HIGH (ref 0.44–1.00)
GFR, Estimated: 31 mL/min — ABNORMAL LOW (ref >60)
Glucose, Bld: 90 mg/dL (ref 70–99)
Potassium: 3.8 mmol/L (ref 3.5–5.1)
Sodium: 136 mmol/L (ref 135–145)

## 2023-07-29 MED ORDER — TORSEMIDE 20 MG PO TABS: 20.0000 mg | ORAL_TABLET | Freq: Two times a day (BID) | ORAL | Status: DC

## 2023-07-29 MED ORDER — TORSEMIDE 20 MG PO TABS
20.0000 mg | ORAL_TABLET | Freq: Two times a day (BID) | ORAL | Status: DC
  Administered 2023-07-29: 20 mg via ORAL
  Filled 2023-07-29 (×2): qty 1

## 2023-07-29 MED ORDER — MIDODRINE HCL 5 MG PO TABS: 5.0000 mg | ORAL_TABLET | Freq: Two times a day (BID) | ORAL | Status: AC

## 2023-07-29 MED ORDER — MIDODRINE HCL 5 MG PO TABS
5.0000 mg | ORAL_TABLET | Freq: Two times a day (BID) | ORAL | Status: DC
  Administered 2023-07-29 – 2023-07-30 (×2): 5 mg via ORAL
  Filled 2023-07-29 (×2): qty 1

## 2023-07-29 NOTE — Progress Notes (Signed)
   07/29/23 2038  BiPAP/CPAP/SIPAP  BiPAP/CPAP/SIPAP Pt Type Adult  BiPAP/CPAP/SIPAP Resmed  Reason BIPAP/CPAP not in use  (pt stated she cant wear hospital CPAP)

## 2023-07-29 NOTE — Discharge Summary (Addendum)
 Physician Discharge Summary  Felicia Benson FMW:[id number] DOB: 1930-05-29 DOA: 88/15/2025  PCP: Patient, No Pcp Per  Admit date: 07/23/2023 Discharge date: 07/30/2023  Time spent: 45 minutes  Recommendations for Outpatient Follow-up:  Cardiology, [facility] heart [facility] Rosaline Bane, NP 7/31 @10 :55 am ([facility]) Wean midodrine  and add GDMT if blood pressure tolerates at follow-up Outpatient palliative care follow-up BMP in 1 week Monitor daily weights, give extra dose of torsemide  for weight gain of 3 LB in 1 day or 5 LB in 1 week   Discharge Diagnoses:  Principal Problem:   Acute on chronic systolic and diastolic CHF Pulmonary hypertension Acute on chronic hypoxic respiratory failure   COPD (chronic obstructive pulmonary disease) (HCC)   Acute CHF (congestive heart failure) (HCC)   Chronic hypoxic respiratory failure (HCC)   GERD (gastroesophageal reflux disease)   OSA (obstructive sleep apnea)   CKD (chronic kidney disease)   Sacral ulcer (HCC)   Hypoxic episode   Discharge Condition: Improved  Diet recommendation: Low-sodium, heart healthy  Filed Weights   07/28/23 0600 07/29/23 0913 07/30/23 0400  Weight: 89.5 kg 85.7 kg 85.4 kg    History of present illness:  89/F w AAA, diastolic CHF, COPD on chronically 4 L O2 nasal cannula, 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 O2 sats were 70% then, EMS was called but it appeared that there was some problem with her concentrator.  Once they switched something out they were able to make sure her oxygen was flowing and her O2 sats came back up.   - Still sent to the ED for further evaluation - In the ER noted to be volume overloaded with pulmonary edema and pleural effusions on x-ray, admitted started on diuretics - Complicated by hypotension, midodrine  dose increased - Echo  noted slight worsening cardiomyopathy, worsening mitral regurgitation - Palliative consulted  Hospital Course:   Acute on chronic combined CHF, RV failure Pulmonary hypertension  Moderate to severe mitral regurgitation - Last echo 2/25 with EF 50-55%, severely reduced RV, moderately elevated PA systolic pressures, moderate TR -Repeat echo with EF down to 45%, severe LVH, reduced RV, moderate to severe mitral regurgitation - Seen by cards during hospitalization in March, palliative care suggested then  - I think her overall prognosis is poor, not a candidate for advanced therapies including valve surgery  - Volume status improving, weight down 10 LB to 188 pounds at discharge, resume torsemide  20 Mg twice daily - GDMT limited by CKD, hypotension - Discussed disease burden with patient and daughter, palliative care consulted> patient declines hospice services at this time, plan for outpatient palliative care -Discharge back to SNF today, follow[facility]up with [facility] heart [facility] in 1 to 2 weeks and check BMP   COPD/chronic respiratory failure -On 4 L home O2 at baseline -Stable, resume albuterol  as needed, does not appear to be on maintenance nebs - SLP eval appreciated, no significant dysphagia   OSA/OHS Resume CPAP   History of DVT/PE History of IVC filter -No longer on anticoagulation, on account of GI bleed, diverticular bleeds   History of abdominal aortic aneurysm  Discharge Exam: Vitals:   07/30/23 0500 07/30/23 0700  BP:    Pulse: 92 89  Resp: 19 16  Temp:    SpO2: 94% 92%   General exam: Appears calm and comfortable, AAO x 3  HEENT: Positive JVD Respiratory system: Few basilar Rales, occasional scattered rhonchi Cardiovascular system: S1 & S2 heard, RRR.  Abd: nondistended, soft and nontender.Normal bowel sounds heard. Central nervous system: Alert and oriented. No focal neurological deficits. Extremities: Trace edema, Skin: No rashes, chronic skin changes Psychiatry:   Mood & affect appropriate.   Discharge Instructions   Discharge Instructions     Diet - low sodium heart healthy   Complete by: As directed    Discharge wound care:   Complete by: As directed    Routine, cleanse sacral wound with NS and pat dry, apply medihomey to wound bed, cover with gauze and silicone foam   Increase activity slowly   Complete by: As directed       Allergies as of 07/30/2023       Reactions   Motrin [ibuprofen] Other (See Comments)   GI bleed   Ace Inhibitors Cough   Breztri  Aerosphere [budeson-glycopyrrol-formoterol ] Hypertension   Cipro [ciprofloxacin Hcl] Other (See Comments)   Avoid fluoroquinolones due to asc-aortic aneurysm   Levaquin [levofloxacin] Other (See Comments)   Avoid fluoroquinolones due to asc-aortic aneurysm   Nsaids Nausea And Vomiting, Other (See Comments)   Hx of GI bleed        Medication List     STOP taking these medications    guaiFENesin  100 MG/5ML liquid Commonly known as: ROBITUSSIN       TAKE these medications    acetaminophen  500 MG tablet Commonly known as: TYLENOL  Take 1,000 mg by mouth at bedtime. Give with melatonin   albuterol  108 (90 Base) MCG/ACT inhaler Commonly known as: VENTOLIN  HFA Inhale 2 puffs into the lungs every 6 (six) hours as needed for wheezing or shortness of breath.   Aspercreme Lidocaine  4 % Generic drug: lidocaine  Place 1 patch onto the skin daily as needed (knee pain).   aspirin  EC 81 MG tablet Take 1 tablet (81 mg total) by mouth daily. Swallow whole. What changed: when to take this   atorvastatin  40 MG tablet Commonly known as: LIPITOR Take 40 mg by mouth at bedtime.   cetirizine 10 MG tablet Commonly known as: ZYRTEC Take 10 mg by mouth in the morning.   cholecalciferol  25 MCG (1000 UNIT) tablet Commonly known as: VITAMIN D3 Take 1,000 Units by mouth in the morning.   Delsym  30 MG/5ML liquid Generic drug: dextromethorphan  Take 10 mLs by mouth 2 (two) times daily  as needed for cough.   diphenhydramine -acetaminophen  25-500 MG Tabs tablet Commonly known as: TYLENOL  PM Take 1 tablet by mouth at bedtime as needed.   docusate sodium  100 MG capsule Commonly known as: COLACE Take 200 mg by mouth in the morning.   liver oil-zinc  oxide 40 % ointment Commonly known as: DESITIN Apply topically 2 (two) times daily.   melatonin 3 MG Tabs tablet Take 9 mg by mouth at bedtime.   midodrine  5 MG tablet Commonly known as: PROAMATINE  Take 1 tablet (5 mg total) by mouth 2 (two) times daily with a meal.   mometasone -formoterol  100-5 MCG/ACT Aero Commonly known as: DULERA  Inhale 2 puffs into the lungs 2 (two) times daily.   montelukast  10 MG tablet Commonly known as: SINGULAIR  Take 1 tablet (10 mg total) by mouth daily. What changed: when to take this   ondansetron  4 MG tablet Commonly known as: ZOFRAN  Take 4 mg by mouth every 6 (six) hours as needed for nausea or vomiting.   OXYGEN Inhale 4 L into the lungs continuous.   pantoprazole   40 MG tablet Commonly known as: PROTONIX  Take 40 mg by mouth in the morning and at bedtime.   Pentasa  500 MG CR capsule Generic drug: mesalamine  Take 1,000 mg by mouth in the morning and at bedtime.   polyethylene glycol 17 g packet Commonly known as: MIRALAX  / GLYCOLAX  Take 17 g by mouth 2 (two) times daily as needed for mild constipation or moderate constipation.   saccharomyces boulardii 250 MG capsule Commonly known as: FLORASTOR Take 250 mg by mouth 2 (two) times daily.   torsemide  20 MG tablet Commonly known as: DEMADEX  Take 1 tablet (20 mg total) by mouth 2 (two) times daily. What changed:  how much to take when to take this               Discharge Care Instructions  (From admission, onward)           Start     Ordered   07/29/23 0000  Discharge wound care:       Comments: Routine, cleanse sacral wound with NS and pat dry, apply medihomey to wound bed, cover with gauze and silicone  foam   07/29/23 0953           Allergies  Allergen Reactions   Motrin [Ibuprofen] Other (See Comments)    GI bleed   Ace Inhibitors Cough   Breztri  Aerosphere [Budeson-Glycopyrrol-Formoterol ] Hypertension   Cipro [Ciprofloxacin Hcl] Other (See Comments)    Avoid fluoroquinolones due to asc-aortic aneurysm   Levaquin [Levofloxacin] Other (See Comments)    Avoid fluoroquinolones due to asc-aortic aneurysm   Nsaids Nausea And Vomiting and Other (See Comments)    Hx of GI bleed      The results of significant diagnostics from this hospitalization (including imaging, microbiology, ancillary and laboratory) are listed below for reference.    Significant Diagnostic Studies: ECHOCARDIOGRAM LIMITED Result Date: 07/24/2023    ECHOCARDIOGRAM LIMITED REPORT   Patient Name:   Felicia Benson Date of Exam: 07/24/2023 Medical Rec #:  [id number]        Height:       66.0 in Accession #:    [id number]       Weight:       182.8 lb Date of Birth:  1930/07/02         BSA:          1.925 m Patient Age:    89 years         BP:           97/64 mmHg Patient Gender: F                HR:           98 bpm. Exam Location:  Inpatient Procedure: 2D Echo, Limited Echo and Limited Color Doppler (Both Spectral and            Color Flow Doppler were utilized during procedure). Indications:    Congestive Heart Failure  History:        Patient has prior history of Echocardiogram examinations. CHF,                 CAD, TIA; Risk Factors:Dyslipidemia.  Sonographer:    Christiana Mbomeh Referring Phys: [id number] CLAUDIA CLAIBORNE IMPRESSIONS  1. Left ventricular ejection fraction, by estimation, is 45 to 50%. The left ventricle has mildly decreased function. The left ventricle demonstrates global hypokinesis. There is severe asymmetric left ventricular hypertrophy of the septal segment. There is the interventricular septum is flattened  in systole and diastole, consistent with right ventricular pressure and volume overload.  2. Right  ventricular systolic function is mildly reduced. The right ventricular size is moderately enlarged.  3. The mitral valve is normal in structure. Moderate to severe mitral valve regurgitation. No evidence of mitral stenosis.  4. Tricuspid valve regurgitation is moderate.  5. The aortic valve is normal in structure. Aortic valve regurgitation is mild. No aortic stenosis is present.  6. The inferior vena cava is normal in size with greater than 50% respiratory variability, suggesting right atrial pressure of 3 mmHg. FINDINGS  Left Ventricle: Left ventricular ejection fraction, by estimation, is 45 to 50%. The left ventricle has mildly decreased function. The left ventricle demonstrates global hypokinesis. The left ventricular internal cavity size was normal in size. There is  severe asymmetric left ventricular hypertrophy of the septal segment. The interventricular septum is flattened in systole and diastole, consistent with right ventricular pressure and volume overload. Right Ventricle: The right ventricular size is moderately enlarged. No increase in right ventricular wall thickness. Right ventricular systolic function is mildly reduced. Left Atrium: Left atrial size was normal in size. Right Atrium: Right atrial size was normal in size. Pericardium: There is no evidence of pericardial effusion. Mitral Valve: The mitral valve is normal in structure. Moderate to severe mitral valve regurgitation. No evidence of mitral valve stenosis. Tricuspid Valve: The tricuspid valve is normal in structure. Tricuspid valve regurgitation is moderate . No evidence of tricuspid stenosis. Aortic Valve: The aortic valve is normal in structure. Aortic valve regurgitation is mild. No aortic stenosis is present. Pulmonic Valve: The pulmonic valve was normal in structure. Pulmonic valve regurgitation is not visualized. No evidence of pulmonic stenosis. Aorta: The aortic root is normal in size and structure. Venous: The inferior vena cava  is normal in size with greater than 50% respiratory variability, suggesting right atrial pressure of 3 mmHg. IAS/Shunts: No atrial level shunt detected by color flow Doppler. Additional Comments: A device lead is visualized.  LEFT VENTRICLE PLAX 2D LVIDd:         4.20 cm LVIDs:         3.00 cm LV PW:         1.30 cm LV IVS:        1.60 cm LVOT diam:     2.10 cm LV SV:         30 LV SV Index:   16 LVOT Area:     3.46 cm  RIGHT VENTRICLE            IVC RV FAC:         13.3 %     IVC diam: 2.20 cm RV S prime:     8.24 cm/s TAPSE (M-mode): 1.4 cm LEFT ATRIUM             Index        RIGHT ATRIUM           Index LA Vol (A2C):   59.6 ml 30.97 ml/m  RA Area:     22.50 cm LA Vol (A4C):   35.5 ml 18.44 ml/m  RA Volume:   65.60 ml  34.08 ml/m LA Biplane Vol: 46.0 ml 23.90 ml/m  AORTIC VALVE LVOT Vmax:   65.53 cm/s LVOT Vmean:  39.867 cm/s LVOT VTI:    0.088 m TRICUSPID VALVE TR Peak grad:   62.4 mmHg TR Vmax:        395.00 cm/s  SHUNTS Systemic VTI:  0.09  m Systemic Diam: 2.10 cm Dub Tobb DO Electronically signed by Dub Huntsman DO Signature Date/Time: 07/24/2023/2:59:02 PM    Final    DG Chest Portable 1 View Result Date: 07/23/2023 CLINICAL DATA:  hypoxia EXAM: PORTABLE CHEST - 1 VIEW COMPARISON:  June 25, 2023, April 07, 2023 FINDINGS: Diffuse bilateral perihilar interstitial opacities. Patchy airspace opacities in both lung bases. Small bilateral pleural effusions. No pneumothorax. Mild cardiomegaly. Left chest pacemaker with leads terminating in the right atrium and right ventricle. Tortuous aorta with aortic atherosclerosis. No acute fracture or destructive lesions. Cervical fusion hardware partially visualized. Multilevel thoracic osteophytosis. Surgical clips in the left neck. IMPRESSION: Cardiomegaly with findings of pulmonary edema and small bilateral pleural effusions. Electronically Signed   By: Rogelia Myers M.D.   On: 07/23/2023 17:08    Microbiology: Recent Results (from the past 240 hours)   Blood culture (routine x 2)     Status: None   Collection Time: 07/23/23  4:48 PM   Specimen: BLOOD LEFT FOREARM  Result Value Ref Range Status   Specimen Description BLOOD LEFT FOREARM  Final   Special Requests   Final    BOTTLES DRAWN AEROBIC AND ANAEROBIC Blood Culture results may not be optimal due to an inadequate volume of blood received in culture bottles   Culture   Final    NO GROWTH 5 DAYS Performed at [facility], [address], [place], [place] [address]    Report Status 07/28/2023 FINAL  Final  Resp panel by RT-PCR (RSV, Flu A&B, Covid) Anterior Nasal Swab     Status: None   Collection Time: 07/23/23  7:35 PM   Specimen: Anterior Nasal Swab  Result Value Ref Range Status   SARS Coronavirus 2 by RT PCR NEGATIVE NEGATIVE Final   Influenza A by PCR NEGATIVE NEGATIVE Final   Influenza B by PCR NEGATIVE NEGATIVE Final    Comment: (NOTE) The Xpert Xpress SARS-CoV-2/FLU/RSV plus assay is intended as an aid in the diagnosis of influenza from Nasopharyngeal swab specimens and should not be used as a sole basis for treatment. Nasal washings and aspirates are unacceptable for Xpert Xpress SARS-CoV-2/FLU/RSV testing.  Fact Sheet for Patients: BloggerCourse.com  Fact Sheet for Healthcare Providers: SeriousBroker.it  This test is not yet approved or cleared by the [place]  FDA and has been authorized for detection and/or diagnosis of SARS-CoV-2 by FDA under an Emergency Use Authorization (EUA). This EUA will remain in effect (meaning this test can be used) for the duration of the COVID-19 declaration under Section 564(b)(1) of the Act, 21 U.S.C. section 360bbb-3(b)(1), unless the authorization is terminated or revoked.     Resp Syncytial Virus by PCR NEGATIVE NEGATIVE Final    Comment: (NOTE) Fact Sheet for Patients: BloggerCourse.com  Fact Sheet for Healthcare  Providers: SeriousBroker.it  This test is not yet approved or cleared by the [place]  FDA and has been authorized for detection and/or diagnosis of SARS-CoV-2 by FDA under an Emergency Use Authorization (EUA). This EUA will remain in effect (meaning this test can be used) for the duration of the COVID-19 declaration under Section 564(b)(1) of the Act, 21 U.S.C. section 360bbb-3(b)(1), unless the authorization is terminated or revoked.  Performed at [facility], [address], [place], [place] [address]      Labs: Basic Metabolic Panel: Recent Labs  Lab 07/26/23 0224 07/27/23 0248 07/28/23 0336 07/29/23 0333 07/30/23 0215  NA 134* 135 135 136 137  K 4.0 4.1 4.1 3.8  3.6  CL 95* 95* 93* 95* 98  CO2 30 34* 33* 32 30  GLUCOSE 114* 91 103* 90 103*  BUN 40* 43* 42* 38* 34*  CREATININE 1.63* 1.73* 1.62* 1.55* 1.49*  CALCIUM  7.9* 8.1* 8.0* 8.2* 8.2*   Liver Function Tests: Recent Labs  Lab 07/23/23 1812 07/26/23 0224  AST 30  --   ALT 28  --   ALKPHOS 74  --   BILITOT 0.9  --   PROT 5.5*  --   ALBUMIN  2.1* 2.0*   No results for input(s): LIPASE, AMYLASE in the last 168 hours. No results for input(s): AMMONIA in the last 168 hours. CBC: Recent Labs  Lab 07/23/23 1812 07/23/23 2007 07/24/23 0619  WBC 6.3  --  5.6  NEUTROABS 5.1  --   --   HGB 10.4* 10.5*  10.5* 9.7*  HCT 33.2* 31.0*  31.0* 31.8*  MCV 98.8  --  99.7  PLT 232  --  224   Cardiac Enzymes: No results for input(s): CKTOTAL, CKMB, CKMBINDEX, TROPONINI in the last 168 hours. BNP: BNP (last 3 results) Recent Labs    04/07/23 1711 06/25/23 1455 07/23/23 1812  BNP 2,673.6* 2,051.6* 1,806.7*    ProBNP (last 3 results) No results for input(s): PROBNP in the last 8760 hours.  CBG: Recent Labs  Lab 07/23/23 1946  GLUCAP 86       Signed:  Sigurd Pac MD.  [facility] 07/30/2023, 8:43 AM

## 2023-07-29 NOTE — Plan of Care (Signed)
  Problem: Education: Goal: Knowledge of General Education information will improve Description: Including pain rating scale, medication(s)/side effects and non-pharmacologic comfort measures Outcome: Progressing   Problem: Clinical Measurements: Goal: Ability to maintain clinical measurements within normal limits will improve Outcome: Progressing Goal: Will remain free from infection Outcome: Progressing   Problem: Pain Managment: Goal: General experience of comfort will improve and/or be controlled Outcome: Progressing   Problem: Clinical Measurements: Goal: Respiratory complications will improve Outcome: Not Progressing   Problem: Activity: Goal: Risk for activity intolerance will decrease Outcome: Not Progressing

## 2023-07-29 NOTE — Progress Notes (Signed)
 Spoke with Charvera, RN at [facility] and gave her a full report on pt but she stated they are unable to take a pt on 5L max is 4L for their facility. Delaying d/c and tapered pt currently to 4L and she is now hold at 90%-91%. Will keep pt in upright position and she has a spirometer at bedside as well. Will keep monitoring O2 sats and possible d/c later this afternoon. \

## 2023-07-29 NOTE — TOC Progression Note (Signed)
 Transition of Care [facility]) - Progression Note    Patient Details  Name: Felicia Benson MRN: [id number] Date of Birth: Jul 26, 1930  [facility] CM/SW Contact  Luise JAYSON Pan, [place] Phone Number: 07/29/2023, 5:24 PM  Clinical Narrative:   Patient from [facility]. CSW [employer] with admissions about patient discharging back, [facility] stated patient can return today.   10:55 AM Per bedside RN, facility cannot accept patient on 5L O2 at this time.   10:59AM CSW left VM for [place] with admissions at [facility].   3:45PM CSW received a message from [place] that she is out of office and to contact Damian Blanc regarding patients discharge.   3:55PM CSW left VM for [facility]. Awaiting call back at this time.   4:44 PM CSW spoke with [facility] admissions and they stated patient has an O2 concentrator at facility that goes up to 5L. Patient is able to return to [facility] at this time and does not need to be on 4L O2 to return. CSW spoke with the patient and daughter before hearing back from [facility] and they would like patient to discharge in the morning as they did not have a great experience previously when patient discharged to facility late in the night. CSW notified treatment team.   TOC will continue to follow.             Expected Discharge Plan: Long Term Nursing Home Barriers to Discharge: Barriers Resolved  Expected Discharge Plan and Services In-house Referral: Clinical Social Work     Living arrangements for the past 2 months: Skilled Nursing Facility Expected Discharge Date: 07/29/23                                     Social Determinants of Health (SDOH) Interventions SDOH Screenings   Food Insecurity: No Food Insecurity (07/24/2023)  Housing: Low Risk  (07/24/2023)  Transportation Needs: No Transportation Needs (07/24/2023)  Utilities: Not At Risk (07/24/2023)  Social Connections: Moderately Isolated (07/24/2023)  Tobacco Use: Medium  Risk (07/23/2023)    Readmission Risk Interventions    07/26/2023   11:43 AM 07/24/2022    2:49 PM  Readmission Risk Prevention Plan  Transportation Screening Complete Complete  PCP or Specialist Appt within 5-7 Days  Complete  Home Care Screening  Complete  Medication Review (RN CM)  Complete  Medication Review (RN Care Manager) Complete   PCP or Specialist appointment within 3-5 days of discharge Complete   HRI or Home Care Consult Complete   Palliative Care Screening Not Applicable   Skilled Nursing Facility Not Applicable

## 2023-07-29 NOTE — [place] FL2 (Signed)
 [place]  MEDICAID FL2 LEVEL OF CARE FORM     IDENTIFICATION  Patient Name: Felicia Benson Birthdate: 17-Aug-1930 Sex: female Admission Date (Current Location): 07/23/2023  [facility] and [place] Number:  Producer, television/film/video and Address:  [facility] [place], [address], [place], [place] [address]      Provider Number: [id number]  Attending Physician Name and Address:  Fairy Frames, MD  Relative Name and Phone Number:       Current Level of Care: Hospital Recommended Level of Care: Skilled Nursing Facility Prior Approval Number:    Date Approved/Denied:   PASRR Number: [id number]  Discharge Plan: SNF    Current Diagnoses: Patient Active Problem List   Diagnosis Date Noted   Hypoxic episode 07/23/2023   Acute on chronic respiratory failure with hypoxemia (HCC) 06/25/2023   Myocardial injury 04/08/2023   Sacral ulcer (HCC) 04/08/2023   Hypokalemia 04/08/2023   Goals of care, counseling/discussion 04/08/2023   AKI (acute kidney injury) (HCC) 03/13/2023   Acute on chronic respiratory failure with hypoxia (HCC) 03/08/2023   Elevated troponin 03/08/2023   Acute exacerbation of CHF (congestive heart failure) (HCC) 03/07/2023   Right ventricular failure (HCC) 02/15/2023   Shortness of breath 02/13/2023   DVT (deep venous thrombosis) (HCC) 02/11/2023   Acute pulmonary embolism (HCC) 02/08/2023   Acute respiratory failure with hypoxia (HCC) 02/08/2023   CAP (community acquired pneumonia) 02/07/2023   Filling defect on imaging study 02/07/2023   CKD (chronic kidney disease) 02/07/2023   Pneumonia 02/07/2023   Diverticular hemorrhage 12/28/2022   Ileus, postoperative (HCC) 11/04/2022   Closed right hip fracture (HCC) 10/28/2022   Fall at home, initial encounter 10/28/2022   History of COPD 10/28/2022   Allergic rhinitis 10/28/2022   Anemia of chronic disease 10/28/2022   Chronic rhinitis 08/06/2022   OSA (obstructive sleep apnea) 08/06/2022    Heart failure, acute diastolic (HCC) 07/21/2022   CKD stage 3a, GFR 45-59 ml/min (HCC) 07/21/2022   Thoracic aortic aneurysm (HCC) 07/21/2022   GERD (gastroesophageal reflux disease) 07/21/2022   Pancreatic lesion 07/21/2022   Macular degeneration 07/21/2022   Hypocalcemia 07/21/2022   Insomnia 07/21/2022   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   TIA (transient ischemic attack) 12/31/2020   Acute on chronic diastolic heart failure (HCC) 05/19/2019   Acute CHF (congestive heart failure) (HCC) 05/18/2019   Chronic hypoxic respiratory failure (HCC) 05/18/2019   COPD (chronic obstructive pulmonary disease) (HCC) 05/18/2019   History of cardiac pacemaker 05/18/2019   AAA (abdominal aortic aneurysm) 05/18/2019   Hyperlipidemia 05/18/2019   Abdominal pain in female 05/30/2015   Essential hypertension 05/30/2015   Crohn's disease (HCC) 05/30/2015   Gastrointestinal hemorrhage with melena 05/30/2015   Blood loss anemia 05/30/2015   Hyponatremia 05/30/2015   Kidney disease 05/30/2015   Normocytic anemia 05/30/2015   CAD S/P percutaneous coronary angioplasty 05/30/2015   Acute blood loss anemia 05/30/2015   Heme + stool     Orientation RESPIRATION BLADDER Height & Weight     Self, Time, Situation, Place  O2 (5L O2) Incontinent, External catheter Weight: 188 lb 15 oz (85.7 kg) Height:  5' 6 (167.6 cm)  BEHAVIORAL SYMPTOMS/MOOD NEUROLOGICAL BOWEL NUTRITION STATUS      Incontinent Diet (see dc summary)  AMBULATORY STATUS COMMUNICATION OF NEEDS Skin     Verbally PU Stage and Appropriate Care (Pressure Injury Coccyx Medial Unstageable - Full thickness tissue loss in which the base of the injury is covered by slough (yellow, tan, gray,  green or brown) and/or eschar (tan, brown or black) in the wound bed.)                       Personal Care Assistance Level of Assistance  Bathing, Feeding, Dressing Bathing Assistance: Maximum assistance Feeding assistance: Maximum  assistance Dressing Assistance: Maximum assistance     Functional Limitations Info  Sight, Hearing, Speech Sight Info: Impaired (eyeglasses) Hearing Info: Impaired Speech Info: Adequate    SPECIAL CARE FACTORS FREQUENCY                       Contractures Contractures Info: Not present    Additional Factors Info  Code Status, Allergies Code Status Info: DNR limited Allergies Info: Motrin (ibuprofen); Ace Inhibitors; Breztri  Aerosphere (budeson-glycopyrrol-formoterol ); Cipro (ciprofloxacin Hcl); Levaquin (levofloxacin); Nsaids           Current Medications (07/29/2023):  This is the current hospital active medication list Current Facility-Administered Medications  Medication Dose Route Frequency Provider Last Rate Last Admin   acetaminophen  (TYLENOL ) tablet 1,000 mg  1,000 mg Oral Q6H PRN Joseph, Preetha, MD   1,000 mg at 07/28/23 2148   Or   acetaminophen  (TYLENOL ) suppository 650 mg  650 mg Rectal Q6H PRN Fairy Frames, MD       albuterol  (PROVENTIL ) (2.5 MG/3ML) 0.083% nebulizer solution 2.5 mg  2.5 mg Nebulization Q2H PRN Arthea Child, MD       aspirin  EC tablet 81 mg  81 mg Oral q AM Claiborne, Claudia, MD   81 mg at 07/28/23 0615   atorvastatin  (LIPITOR) tablet 40 mg  40 mg Oral QHS Claiborne, Claudia, MD   40 mg at 07/28/23 2144   docusate sodium  (COLACE) capsule 200 mg  200 mg Oral q AM Claiborne, Claudia, MD   200 mg at 07/28/23 9384   fluticasone  furoate-vilanterol (BREO ELLIPTA ) 200-25 MCG/ACT 1 puff  1 puff Inhalation Daily Fairy Frames, MD   1 puff at 07/29/23 0813   guaiFENesin  (ROBITUSSIN) 100 MG/5ML liquid 10 mL  10 mL Oral TID Arthea Child, MD   10 mL at 07/27/23 2206   heparin  injection 5,000 Units  5,000 Units Subcutaneous Q8H Joseph, Preetha, MD   5,000 Units at 07/29/23 0514   leptospermum manuka honey (MEDIHONEY) paste 1 Application  1 Application Topical Daily Fairy Frames, MD   1 Application at 07/29/23 0946   loratadine   (CLARITIN ) tablet 10 mg  10 mg Oral Daily Claiborne, Claudia, MD   10 mg at 07/28/23 0848   melatonin tablet 9 mg  9 mg Oral QHS Claiborne, Claudia, MD   9 mg at 07/28/23 2144   mesalamine  (PENTASA ) CR capsule 1,000 mg  1,000 mg Oral BID Claiborne, Claudia, MD   1,000 mg at 07/28/23 2145   midodrine  (PROAMATINE ) tablet 5 mg  5 mg Oral BID WC Joseph, Preetha, MD       ondansetron  (ZOFRAN ) tablet 4 mg  4 mg Oral Q6H PRN Arthea Child, MD       Or   ondansetron  (ZOFRAN ) injection 4 mg  4 mg Intravenous Q6H PRN Claiborne, Claudia, MD   4 mg at 07/25/23 2033   pantoprazole  (PROTONIX ) EC tablet 40 mg  40 mg Oral BID Claiborne, Claudia, MD   40 mg at 07/28/23 2144   polyethylene glycol (MIRALAX  / GLYCOLAX ) packet 17 g  17 g Oral Daily PRN Arthea Child, MD   17 g at 07/27/23 2205   saccharomyces boulardii (FLORASTOR) capsule 250  mg  250 mg Oral BID Claiborne, Claudia, MD   250 mg at 07/28/23 2145   torsemide  (DEMADEX ) tablet 20 mg  20 mg Oral BID Joseph, Preetha, MD         Discharge Medications: Please see discharge summary for a list of discharge medications.  Relevant Imaging Results:  Relevant Lab Results:   Additional Information [id number]  Thara Searing C Peggi Yono, LCSWA

## 2023-07-29 NOTE — Plan of Care (Signed)

## 2023-07-29 NOTE — Progress Notes (Signed)
 Remote pacemaker transmission.

## 2023-07-29 NOTE — TOC Initial Note (Addendum)
 Transition of Care [facility]) - Initial/Assessment Note    Patient Details  Name: Felicia Benson MRN: [id number] Date of Birth: 12/02/1930  Transition of Care [facility]) CM/SW Contact:    Luise JAYSON Pan, LCSWA Phone Number: 07/29/2023, 10:32 AM  Clinical Narrative:  Patient is from [facility]. Per MD, patient is medically ready to discharge back. Facility and Daughter notified.   TOC will continue to follow.    Expected Discharge Plan: Long Term Nursing Home Barriers to Discharge: Barriers Resolved   Patient Goals and CMS Choice Patient states their goals for this hospitalization and ongoing recovery are:: To return to LTC          Expected Discharge Plan and Services In-house Referral: Clinical Social Work     Living arrangements for the past 2 months: Skilled Nursing Facility Expected Discharge Date: 07/29/23                                    Prior Living Arrangements/Services Living arrangements for the past 2 months: Skilled Nursing Facility Lives with:: Facility Resident Patient language and need for interpreter reviewed:: No Do you feel safe going back to the place where you live?: Yes      Need for Family Participation in Patient Care: No (Comment) Care giver support system in place?: Yes (comment)   Criminal Activity/Legal Involvement Pertinent to Current Situation/Hospitalization: No - Comment as needed  Activities of Daily Living   ADL Screening (condition at time of admission) Independently performs ADLs?: No Does the patient have a NEW difficulty with bathing/dressing/toileting/self-feeding that is expected to last >3 days?: No Does the patient have a NEW difficulty with getting in/out of bed, walking, or climbing stairs that is expected to last >3 days?: No Does the patient have a NEW difficulty with communication that is expected to last >3 days?: No Is the patient deaf or have difficulty hearing?: No Does the patient have difficulty seeing,  even when wearing glasses/contacts?: Yes Does the patient have difficulty concentrating, remembering, or making decisions?: No  Permission Sought/Granted Permission sought to share information with : Facility Medical sales representative, Family Supports Permission granted to share information with : Yes, Verbal Permission Granted  Share Information with NAME: [name]  Permission granted to share info w AGENCY: SNF  Permission granted to share info w Relationship: daughter  Permission granted to share info w Contact Information: [phone]  Emotional Assessment Appearance:: Appears stated age Attitude/Demeanor/Rapport: Engaged Affect (typically observed): Appropriate Orientation: : Oriented to Situation, Oriented to  Time, Oriented to Place, Oriented to Self Alcohol  / Substance Use: Not Applicable Psych Involvement: No (comment)  Admission diagnosis:  Acute exacerbation of CHF (congestive heart failure) (HCC) [I50.9] Acute on chronic hypoxic respiratory failure (HCC) [J96.21] Patient Active Problem List   Diagnosis Date Noted   Hypoxic episode 07/23/2023   Acute on chronic respiratory failure with hypoxemia (HCC) 06/25/2023   Myocardial injury 04/08/2023   Sacral ulcer (HCC) 04/08/2023   Hypokalemia 04/08/2023   Goals of care, counseling/discussion 04/08/2023   AKI (acute kidney injury) (HCC) 03/13/2023   Acute on chronic respiratory failure with hypoxia (HCC) 03/08/2023   Elevated troponin 03/08/2023   Acute exacerbation of CHF (congestive heart failure) (HCC) 03/07/2023   Right ventricular failure (HCC) 02/15/2023   Shortness of breath 02/13/2023   DVT (deep venous thrombosis) (HCC) 02/11/2023   Acute pulmonary embolism (HCC) 02/08/2023   Acute respiratory failure with hypoxia (HCC) 02/08/2023  CAP (community acquired pneumonia) 02/07/2023   Filling defect on imaging study 02/07/2023   CKD (chronic kidney disease) 02/07/2023   Pneumonia 02/07/2023   Diverticular hemorrhage  12/28/2022   Ileus, postoperative (HCC) 11/04/2022   Closed right hip fracture (HCC) 10/28/2022   Fall at home, initial encounter 10/28/2022   History of COPD 10/28/2022   Allergic rhinitis 10/28/2022   Anemia of chronic disease 10/28/2022   Chronic rhinitis 08/06/2022   OSA (obstructive sleep apnea) 08/06/2022   Heart failure, acute diastolic (HCC) 07/21/2022   CKD stage 3a, GFR 45-59 ml/min (HCC) 07/21/2022   Thoracic aortic aneurysm (HCC) 07/21/2022   GERD (gastroesophageal reflux disease) 07/21/2022   Pancreatic lesion 07/21/2022   Macular degeneration 07/21/2022   Hypocalcemia 07/21/2022   Insomnia 07/21/2022   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   TIA (transient ischemic attack) 12/31/2020   Acute on chronic diastolic heart failure (HCC) 05/19/2019   Acute CHF (congestive heart failure) (HCC) 05/18/2019   Chronic hypoxic respiratory failure (HCC) 05/18/2019   COPD (chronic obstructive pulmonary disease) (HCC) 05/18/2019   History of cardiac pacemaker 05/18/2019   AAA (abdominal aortic aneurysm) 05/18/2019   Hyperlipidemia 05/18/2019   Abdominal pain in female 05/30/2015   Essential hypertension 05/30/2015   Crohn's disease (HCC) 05/30/2015   Gastrointestinal hemorrhage with melena 05/30/2015   Blood loss anemia 05/30/2015   Hyponatremia 05/30/2015   Kidney disease 05/30/2015   Normocytic anemia 05/30/2015   CAD S/P percutaneous coronary angioplasty 05/30/2015   Acute blood loss anemia 05/30/2015   Heme + stool    PCP:  Patient, No Pcp Per Pharmacy:   [facility] [address] [place] - [address] [place] [address] Phone: [phone] Fax: [phone]  [facility] [place], [place] - [address] [place] [address] Phone: [phone] Fax: [phone]     Social Drivers of Health (SDOH) Social History: SDOH Screenings   Food Insecurity: No Food Insecurity  (07/24/2023)  Housing: Low Risk  (07/24/2023)  Transportation Needs: No Transportation Needs (07/24/2023)  Utilities: Not At Risk (07/24/2023)  Social Connections: Moderately Isolated (07/24/2023)  Tobacco Use: Medium Risk (07/23/2023)   SDOH Interventions:     Readmission Risk Interventions    07/26/2023   11:43 AM 07/24/2022    2:49 PM  Readmission Risk Prevention Plan  Transportation Screening Complete Complete  PCP or Specialist Appt within 5-7 Days  Complete  Home Care Screening  Complete  Medication Review (RN CM)  Complete  Medication Review (RN Care Manager) Complete   PCP or Specialist appointment within 3-5 days of discharge Complete   HRI or Home Care Consult Complete   Palliative Care Screening Not Applicable   Skilled Nursing Facility Not Applicable

## 2023-07-30 LAB — BASIC METABOLIC PANEL WITH GFR
Anion gap: 9 (ref 5–15)
BUN: 34 mg/dL — ABNORMAL HIGH (ref 8–23)
CO2: 30 mmol/L (ref 22–32)
Calcium: 8.2 mg/dL — ABNORMAL LOW (ref 8.9–10.3)
Chloride: 98 mmol/L (ref 98–111)
Creatinine, Ser: 1.49 mg/dL — ABNORMAL HIGH (ref 0.44–1.00)
GFR, Estimated: 33 mL/min — ABNORMAL LOW (ref >60)
Glucose, Bld: 103 mg/dL — ABNORMAL HIGH (ref 70–99)
Potassium: 3.6 mmol/L (ref 3.5–5.1)
Sodium: 137 mmol/L (ref 135–145)

## 2023-07-30 NOTE — Progress Notes (Signed)
 Spoke with receptionist at [facility] facility and I got transferred and nurse didn't pick up. I called twice and I will attempt to call back again before pt's discharge.

## 2023-07-30 NOTE — TOC Progression Note (Addendum)
 Transition of Care [facility]) - Progression Note    Patient Details  Name: Felicia Benson MRN: [id number] Date of Birth: 09/29/1930  [facility]) CM/SW Contact  Luise JAYSON Pan, [place] Phone Number: 07/30/2023, 8:43 AM  Clinical Narrative:   CSW called Jena [phone]) with [facility] admissions to confirm patients return to [facility] today. Nianja stated patient can return at this time. Per our conversation yesterday, patient is okay to return on 5L if need be as patient has a concentrator in her room at the facility that goes up to 5L.   CSW notifeid MD to update date on DC summary.    10:29 AM CSW comminucated above information to [facility] and patient.  TOC will continue to follow.    Expected Discharge Plan: Long Term Nursing Home Barriers to Discharge: Barriers Resolved  Expected Discharge Plan and Services In-house Referral: Clinical Social Work     Living arrangements for the past 2 months: Skilled Nursing Facility Expected Discharge Date: 07/29/23                                     Social Determinants of Health (SDOH) Interventions SDOH Screenings   Food Insecurity: No Food Insecurity (07/24/2023)  Housing: Low Risk  (07/24/2023)  Transportation Needs: No Transportation Needs (07/24/2023)  Utilities: Not At Risk (07/24/2023)  Social Connections: Moderately Isolated (07/24/2023)  Tobacco Use: Medium Risk (07/23/2023)    Readmission Risk Interventions    07/26/2023   11:43 AM 07/24/2022    2:49 PM  Readmission Risk Prevention Plan  Transportation Screening Complete Complete  PCP or Specialist Appt within 5-7 Days  Complete  Home Care Screening  Complete  Medication Review (RN CM)  Complete  Medication Review (RN Care Manager) Complete   PCP or Specialist appointment within 3-5 days of discharge Complete   HRI or Home Care Consult Complete   Palliative Care Screening Not Applicable   Skilled Nursing Facility Not Applicable

## 2023-07-30 NOTE — TOC Transition Note (Signed)
 Transition of Care [facility]) - Discharge Note   Patient Details  Name: Felicia Benson MRN: [id number] Date of Birth: 16-Feb-1930  Transition of Care [facility]) CM/SW Contact:  Felicia Benson, LCSWA Phone Number: 07/30/2023, 10:30 AM   Clinical Narrative:   Patient will DC to: [facility] Anticipated DC date: 07/30/23  Family notified: [name] (daughter) Transport by: [facility]   Per MD patient ready for DC to . RN to call report prior to discharge [phone]). RN, patient, patient's family, and facility notified of DC. Discharge Summary and FL2 sent to facility. DC packet on chart. Ambulance transport requested for patient 10:31 AM.   CSW will sign off for now as social work intervention is no longer needed. Please consult us  again if new needs arise.      Final next level of care: Skilled Nursing Facility Barriers to Discharge: Barriers Resolved   Patient Goals and CMS Choice Patient states their goals for this hospitalization and ongoing recovery are:: To return to LTC          Discharge Placement              Patient chooses bed at: [facility] Patient to be transferred to facility by: [facility] Name of family member notified: [name] (daughter) Patient and family notified of of transfer: 07/30/23  Discharge Plan and Services Additional resources added to the After Visit Summary for   In-house Referral: Clinical Social Work                                   Social Drivers of Health (SDOH) Interventions SDOH Screenings   Food Insecurity: No Food Insecurity (07/24/2023)  Housing: Low Risk  (07/24/2023)  Transportation Needs: No Transportation Needs (07/24/2023)  Utilities: Not At Risk (07/24/2023)  Social Connections: Moderately Isolated (07/24/2023)  Tobacco Use: Medium Risk (07/23/2023)     Readmission Risk Interventions    07/26/2023   11:43 AM 07/24/2022    2:49 PM  Readmission Risk Prevention Plan  Transportation Screening Complete Complete  PCP or Specialist  Appt within 5-7 Days  Complete  Home Care Screening  Complete  Medication Review (RN CM)  Complete  Medication Review (RN Care Manager) Complete   PCP or Specialist appointment within 3-5 days of discharge Complete   HRI or Home Care Consult Complete   Palliative Care Screening Not Applicable   Skilled Nursing Facility Not Applicable

## 2023-07-30 NOTE — Plan of Care (Signed)
  Problem: Clinical Measurements: Goal: Will remain free from infection Outcome: Progressing Goal: Cardiovascular complication will be avoided Outcome: Progressing   Problem: Elimination: Goal: Will not experience complications related to bowel motility Outcome: Progressing Goal: Will not experience complications related to urinary retention Outcome: Progressing   Problem: Pain Managment: Goal: General experience of comfort will improve and/or be controlled Outcome: Progressing   Problem: Activity: Goal: Risk for activity intolerance will decrease Outcome: Not Progressing   Problem: Skin Integrity: Goal: Risk for impaired skin integrity will decrease Outcome: Not Progressing

## 2023-07-30 NOTE — Plan of Care (Signed)
  Problem: Education: Goal: Knowledge of General Education information will improve Description: Including pain rating scale, medication(s)/side effects and non-pharmacologic comfort measures Outcome: Adequate for Discharge   Problem: Health Behavior/Discharge Planning: Goal: Ability to manage health-related needs will improve Outcome: Adequate for Discharge   Problem: Clinical Measurements: Goal: Ability to maintain clinical measurements within normal limits will improve Outcome: Adequate for Discharge Goal: Will remain free from infection Outcome: Adequate for Discharge Goal: Diagnostic test results will improve Outcome: Adequate for Discharge Goal: Respiratory complications will improve Outcome: Adequate for Discharge Goal: Cardiovascular complication will be avoided Outcome: Adequate for Discharge   Problem: Activity: Goal: Risk for activity intolerance will decrease Outcome: Adequate for Discharge   Problem: Nutrition: Goal: Adequate nutrition will be maintained Outcome: Adequate for Discharge   Problem: Coping: Goal: Level of anxiety will decrease Outcome: Adequate for Discharge   Problem: Elimination: Goal: Will not experience complications related to bowel motility Outcome: Adequate for Discharge Goal: Will not experience complications related to urinary retention Outcome: Adequate for Discharge   Problem: Pain Managment: Goal: General experience of comfort will improve and/or be controlled Outcome: Adequate for Discharge   Problem: Safety: Goal: Ability to remain free from injury will improve Outcome: Adequate for Discharge   Problem: Skin Integrity: Goal: Risk for impaired skin integrity will decrease Outcome: Adequate for Discharge   Problem: Education: Goal: Knowledge of General Education information will improve Description: Including pain rating scale, medication(s)/side effects and non-pharmacologic comfort measures Outcome: Adequate for  Discharge   Problem: Health Behavior/Discharge Planning: Goal: Ability to manage health-related needs will improve Outcome: Adequate for Discharge   Problem: Clinical Measurements: Goal: Ability to maintain clinical measurements within normal limits will improve Outcome: Adequate for Discharge Goal: Will remain free from infection Outcome: Adequate for Discharge Goal: Diagnostic test results will improve Outcome: Adequate for Discharge Goal: Respiratory complications will improve Outcome: Adequate for Discharge Goal: Cardiovascular complication will be avoided Outcome: Adequate for Discharge   Problem: Activity: Goal: Risk for activity intolerance will decrease Outcome: Adequate for Discharge   Problem: Elimination: Goal: Will not experience complications related to bowel motility Outcome: Adequate for Discharge Goal: Will not experience complications related to urinary retention Outcome: Adequate for Discharge   Problem: Pain Managment: Goal: General experience of comfort will improve and/or be controlled Outcome: Adequate for Discharge   Problem: Safety: Goal: Ability to remain free from injury will improve Outcome: Adequate for Discharge   Problem: Skin Integrity: Goal: Risk for impaired skin integrity will decrease Outcome: Adequate for Discharge

## 2023-07-30 NOTE — Progress Notes (Signed)
 No changes from my discharge summary yesterday, stable for discharge back to [facility]

## 2023-08-07 ENCOUNTER — Encounter (HOSPITAL_BASED_OUTPATIENT_CLINIC_OR_DEPARTMENT_OTHER): Payer: Self-pay

## 2023-08-07 NOTE — Progress Notes (Unsigned)
 Cardiology Office Note   Date:  08/08/2023  ID:  [name], Repetto [birth year], MRN [id number] PCP: [facility] Providers Cardiologist:  Annabella Scarce, MD Electrophysiologist:  Soyla Gladis Norton, MD     [facility] Chronic diastolic heart failure COPD Complete heart block s/p PPM implant 07/2022 Pulmonary hypertension Coronary artery disease S/p PCI of RCA and repeat PCI 2017 for ISR Acute PE/DEVT s/p IVC filter 01/2023 AAA Hypertension CKD Crohn's disease with recurrent diverticular bleed 2020 Chronic respiratory failure Former tobacco use > 30 years  Previously followed by cardiology in [place].  Admission 02/2023 at [facility].  Cardiology consulted for management of heart failure.  Reportedly had abnormal Myoview with defects in the apical/inferior lateral walls but subsequent cath January 2022 did not show significant CAD.  She was titrated on antianginals for microvascular disease.  Hospitalization December 2024 with lower GI bleed secondary to Crohn's disease requiring blood transfusions.  Admission January 2025 for multifocal pneumonia, subsegmental PE/DVT, not placed on DOAC given recent GI bleed.  She had IVC filter placed.  Seen February 2025 for CHF exacerbation after suffering a fall.  Echo with preserved EF, D shaped septum, severely reduced RV with RVSP 59.6 mmHg.  Was reportedly discharged on 60 mg of Lasix  but only received 20 mg at her facility.  Repeat admission 03/05/2023 noted to be hypoxic with O2 sats in the 80s at her facility.  She was hypotensive and was given midodrine  10 mg 3 times daily.  She reported chest tightness that was relieved by albuterol  inhaler.  She reported being able to ambulate minimally but somewhat still self-sufficient with a daughter who helps care for her at the facility.  Noted to have chronically low albumin .  Troponins were flat at 48 >> 45.  She was discharged on torsemide  20 mg daily and midodrine .  Palliative care was  consulted.  Office visit 04/23/2023 with Dr. Norton.  She was feeling well from a volume perspective with no edema, orthopnea, PND, or shortness of breath.  Weight was down.  No changes were made to PPM and EP f/u recommended in 1 year. She was advised to follow-up with general cardiology.  History of Present Illness Discussed the use of AI scribe software for clinical note transcription with the patient, who gave verbal consent to proceed.  History of Present Illness Felicia Benson is a very pleasant 89 year old female who presents today for follow-up of heart failure. She is accompanied by her daughter and is traveling by wheelchair from assisted living facility.  She is concerned about fluid retention with weight fluctuations since her recent hospital discharge. Her weight typically ranges between 181 and 191 pounds, and she emphasizes the need for consistent weighing methods. She is on torsemide  twice daily 40 mg in the a.m. and 20 mg in the p.m.with significant urine output for approximately an hour and a half post-administration.  She denies chest pain, orthopnea, PND. her fluid intake is 1500 to 1800 cc daily, mainly from tea and fruit punches, avoiding carbonated beverages due to acid reflux. She has not felt well enough to walk much in the past two days and experiences some shortness of breath but does not feel it is significant. She participates in PT. She uses a CPAP machine at night with oxygen at five liters, maintaining an oxygen saturation of 93 to 95 percent. Her bed is slightly elevated to aid breathing and prevent aspiration due to a persistent cough.  Blood pressure is typically lower  in the morning, with a recent reading of 106/70 mmHg. She takes midodrine  as needed for low blood pressure. No lightheadedness or dizziness when ambulating, no presyncope or syncope.   ROS: See HPI  Studies Reviewed       No results found for: LIPOA  Risk Assessment/Calculations            Physical Exam VS:  BP (!) 110/58 (BP Location: Right Arm, Patient Position: Sitting, Cuff Size: Normal)   Pulse 95   Ht 5' 6 (1.676 m)   Wt 188 lb (85.3 kg) Comment: at rehab  SpO2 (!) 74% Comment: 5L  BMI 30.34 kg/m    Wt Readings from Last 3 Encounters:  08/08/23 188 lb (85.3 kg)  07/30/23 188 lb 4.4 oz (85.4 kg)  07/01/23 182 lb 12.2 oz (82.9 kg)    GEN: Well nourished, well developed in no acute distress NECK: No JVD; No carotid bruits CARDIAC: RRR, no murmurs, rubs, gallops RESPIRATORY:  Clear to auscultation without rales, wheezing or rhonchi  ABDOMEN: Soft, non-tender, non-distended EXTREMITIES:  Swelling in upper and lower extremities, no erythema    Assessment & Plan Chronic combined systolic and diastolic heart failure  Recent admission with concern for volume overload, and hypoxia.  Pulmonary edema and pleural effusions on x-ray.  Echo 07/24/2023 revealed LVEF 45 to 50%, global hypokinesis of LV, severe asymmetric LVH of the septal segment, flattened interventricular septum, moderately enlarged RV, moderate to severe mitral valve regurgitation. She has overall felt well since hospital discharge with SOB that she feels is stable. Is concerned about fluid retention and weight fluctuations; does not feel staff at SNF is weighing her appropriately. She has persistent cough with laying down and sleeps with her HOB slightly elevated, no change recently. Fine crackles left lower lobe. She is knowledgeable and involved in care decisions and feels we need to increase torsemide  to 40 mg BID. Not a candidate for advanced therapies. GDMT limited by CKD and hypotension.  -Increase torsemide  to 40 mg BID at 0900 and 1300, with monitoring of blood pressure and kidney function.  -Check BMET in one week -Monitor weight daily using consistent methods and equipment.  -Report increase in weight or worsening SOB, orthopnea or PND -Limit fluids to 2L, mostly water  Chronic respiratory failure  with hypoxia, on supplemental oxygen COPD Managed with supplemental oxygen at 5 L/min and CPAP at night, maintaining oxygen saturation between 93-95%. Occasional cough is present. A new oxygen machine is ordered for higher flow rates if needed.  -Maintain supplemental oxygen at 5 L/min and use CPAP machine at night  CHB s/p PPM implant Remote device check 06/10/2023 with normal device function. -Management per EP cardiology -Continue routine remote device monitoring  Hypotension Blood pressure is typically lower in the morning, with midodrine  used as needed for low blood pressure. No episodes of lightheadedness or syncope. Blood pressure was 106/70 this morning and tends to be higher in the evening.  We discussed that BP may be slightly reduced on higher dose of torsemide .  -Continue midodrine  as needed for low blood pressure  Chronic kidney disease, stage IIIb Reports blood was obtained this morning.  -Check basic metabolic panel in one week to assess kidney function on higher dose torsemide         Dispo: 3-4 months with Dr. Raford  Signed, Rosaline Bane, NP-C

## 2023-08-08 ENCOUNTER — Ambulatory Visit (INDEPENDENT_AMBULATORY_CARE_PROVIDER_SITE_OTHER): Admitting: Nurse Practitioner

## 2023-08-08 ENCOUNTER — Encounter (HOSPITAL_BASED_OUTPATIENT_CLINIC_OR_DEPARTMENT_OTHER): Payer: Self-pay | Admitting: Nurse Practitioner

## 2023-08-08 VITALS — BP 110/58 | HR 95 | Ht 66.0 in | Wt 188.0 lb

## 2023-08-08 DIAGNOSIS — I959 Hypotension, unspecified: Secondary | ICD-10-CM | POA: Diagnosis not present

## 2023-08-08 DIAGNOSIS — N1832 Chronic kidney disease, stage 3b: Secondary | ICD-10-CM

## 2023-08-08 DIAGNOSIS — J9611 Chronic respiratory failure with hypoxia: Secondary | ICD-10-CM

## 2023-08-08 DIAGNOSIS — I442 Atrioventricular block, complete: Secondary | ICD-10-CM

## 2023-08-08 DIAGNOSIS — I5042 Chronic combined systolic (congestive) and diastolic (congestive) heart failure: Secondary | ICD-10-CM | POA: Diagnosis not present

## 2023-08-08 DIAGNOSIS — Z950 Presence of cardiac pacemaker: Secondary | ICD-10-CM

## 2023-08-08 DIAGNOSIS — J439 Emphysema, unspecified: Secondary | ICD-10-CM

## 2023-08-08 MED ORDER — TORSEMIDE 20 MG PO TABS: 40.0000 mg | ORAL_TABLET | Freq: Two times a day (BID) | ORAL | Status: DC

## 2023-08-08 NOTE — Patient Instructions (Addendum)
 Medication Instructions:  Increase torsemide  to 40 mg twice daily - at 0900 and 1300  *If you need a refill on your cardiac medications before your next appointment, please call your pharmacy*  Lab Work: Basic metabolic panel in 1 week If you have labs (blood work) drawn today and your tests are completely normal, you will receive your results only by: MyChart Message (if you have MyChart) OR A paper copy in the mail If you have any lab test that is abnormal or we need to change your treatment, we will call you to review the results.  Testing/Procedures: Continue daily weight at same time of day in same clothing and with same wheelchair to avoid variation  Follow-Up: At [facility], you and your health needs are our priority.  As part of our continuing mission to provide you with exceptional heart care, our providers are all part of one team.  This team includes your primary Cardiologist (physician) and Advanced Practice Providers or APPs (Physician Assistants and Nurse Practitioners) who all work together to provide you with the care you need, when you need it.  Your next appointment:   3 month(s)-4 months.   Provider:   Annabella Scarce, MD    We recommend signing up for the patient portal called MyChart.  Sign up information is provided on this After Visit Summary.  MyChart is used to connect with patients for Virtual Visits (Telemedicine).  Patients are able to view lab/test results, encounter notes, upcoming appointments, etc.  Non-urgent messages can be sent to your provider as well.   To learn more about what you can do with MyChart, go to [email or web address].   Other Instructions

## 2023-08-17 ENCOUNTER — Emergency Department (HOSPITAL_COMMUNITY)

## 2023-08-17 ENCOUNTER — Encounter (HOSPITAL_COMMUNITY): Payer: Self-pay

## 2023-08-17 ENCOUNTER — Other Ambulatory Visit: Payer: Self-pay

## 2023-08-17 ENCOUNTER — Inpatient Hospital Stay (HOSPITAL_COMMUNITY)
Admission: EM | Admit: 2023-08-17 | Discharge: 2023-08-26 | DRG: 291 | Disposition: A | Discharge status: Skilled Nursing Facility | Source: Skilled Nursing Facility | Attending: Internal Medicine | Admitting: Internal Medicine

## 2023-08-17 DIAGNOSIS — Z79899 Other long term (current) drug therapy: Secondary | ICD-10-CM

## 2023-08-17 DIAGNOSIS — Z955 Presence of coronary angioplasty implant and graft: Secondary | ICD-10-CM

## 2023-08-17 DIAGNOSIS — J9811 Atelectasis: Secondary | ICD-10-CM | POA: Diagnosis present

## 2023-08-17 DIAGNOSIS — I081 Rheumatic disorders of both mitral and tricuspid valves: Secondary | ICD-10-CM | POA: Diagnosis present

## 2023-08-17 DIAGNOSIS — Z886 Allergy status to analgesic agent status: Secondary | ICD-10-CM

## 2023-08-17 DIAGNOSIS — D631 Anemia in chronic kidney disease: Secondary | ICD-10-CM | POA: Diagnosis present

## 2023-08-17 DIAGNOSIS — Z9049 Acquired absence of other specified parts of digestive tract: Secondary | ICD-10-CM

## 2023-08-17 DIAGNOSIS — E785 Hyperlipidemia, unspecified: Secondary | ICD-10-CM | POA: Diagnosis present

## 2023-08-17 DIAGNOSIS — I5033 Acute on chronic diastolic (congestive) heart failure: Secondary | ICD-10-CM | POA: Diagnosis not present

## 2023-08-17 DIAGNOSIS — I2489 Other forms of acute ischemic heart disease: Secondary | ICD-10-CM | POA: Diagnosis present

## 2023-08-17 DIAGNOSIS — I714 Abdominal aortic aneurysm, without rupture, unspecified: Secondary | ICD-10-CM | POA: Diagnosis present

## 2023-08-17 DIAGNOSIS — D638 Anemia in other chronic diseases classified elsewhere: Secondary | ICD-10-CM | POA: Diagnosis present

## 2023-08-17 DIAGNOSIS — N1832 Chronic kidney disease, stage 3b: Secondary | ICD-10-CM | POA: Diagnosis present

## 2023-08-17 DIAGNOSIS — Z86718 Personal history of other venous thrombosis and embolism: Secondary | ICD-10-CM

## 2023-08-17 DIAGNOSIS — K5090 Crohn's disease, unspecified, without complications: Secondary | ICD-10-CM | POA: Diagnosis present

## 2023-08-17 DIAGNOSIS — R0602 Shortness of breath: Secondary | ICD-10-CM | POA: Diagnosis present

## 2023-08-17 DIAGNOSIS — Z66 Do not resuscitate: Secondary | ICD-10-CM | POA: Diagnosis present

## 2023-08-17 DIAGNOSIS — N179 Acute kidney failure, unspecified: Secondary | ICD-10-CM | POA: Diagnosis present

## 2023-08-17 DIAGNOSIS — I272 Pulmonary hypertension, unspecified: Secondary | ICD-10-CM | POA: Diagnosis present

## 2023-08-17 DIAGNOSIS — I5043 Acute on chronic combined systolic (congestive) and diastolic (congestive) heart failure: Secondary | ICD-10-CM | POA: Diagnosis present

## 2023-08-17 DIAGNOSIS — L89150 Pressure ulcer of sacral region, unstageable: Secondary | ICD-10-CM | POA: Diagnosis present

## 2023-08-17 DIAGNOSIS — I10 Essential (primary) hypertension: Secondary | ICD-10-CM | POA: Diagnosis present

## 2023-08-17 DIAGNOSIS — Z7951 Long term (current) use of inhaled steroids: Secondary | ICD-10-CM

## 2023-08-17 DIAGNOSIS — E66811 Obesity, class 1: Secondary | ICD-10-CM | POA: Diagnosis not present

## 2023-08-17 DIAGNOSIS — Z86711 Personal history of pulmonary embolism: Secondary | ICD-10-CM | POA: Diagnosis not present

## 2023-08-17 DIAGNOSIS — Z87891 Personal history of nicotine dependence: Secondary | ICD-10-CM

## 2023-08-17 DIAGNOSIS — E782 Mixed hyperlipidemia: Secondary | ICD-10-CM | POA: Diagnosis not present

## 2023-08-17 DIAGNOSIS — J9621 Acute and chronic respiratory failure with hypoxia: Secondary | ICD-10-CM | POA: Diagnosis present

## 2023-08-17 DIAGNOSIS — Z888 Allergy status to other drugs, medicaments and biological substances status: Secondary | ICD-10-CM

## 2023-08-17 DIAGNOSIS — Z8719 Personal history of other diseases of the digestive system: Secondary | ICD-10-CM

## 2023-08-17 DIAGNOSIS — G4733 Obstructive sleep apnea (adult) (pediatric): Secondary | ICD-10-CM | POA: Diagnosis present

## 2023-08-17 DIAGNOSIS — Z881 Allergy status to other antibiotic agents status: Secondary | ICD-10-CM

## 2023-08-17 DIAGNOSIS — Z9981 Dependence on supplemental oxygen: Secondary | ICD-10-CM

## 2023-08-17 DIAGNOSIS — K50819 Crohn's disease of both small and large intestine with unspecified complications: Secondary | ICD-10-CM

## 2023-08-17 DIAGNOSIS — J449 Chronic obstructive pulmonary disease, unspecified: Secondary | ICD-10-CM | POA: Diagnosis present

## 2023-08-17 DIAGNOSIS — I2510 Atherosclerotic heart disease of native coronary artery without angina pectoris: Secondary | ICD-10-CM | POA: Diagnosis present

## 2023-08-17 DIAGNOSIS — Z6832 Body mass index (BMI) 32.0-32.9, adult: Secondary | ICD-10-CM | POA: Diagnosis not present

## 2023-08-17 DIAGNOSIS — J9611 Chronic respiratory failure with hypoxia: Secondary | ICD-10-CM | POA: Diagnosis not present

## 2023-08-17 DIAGNOSIS — I509 Heart failure, unspecified: Principal | ICD-10-CM

## 2023-08-17 DIAGNOSIS — E041 Nontoxic single thyroid nodule: Secondary | ICD-10-CM | POA: Diagnosis present

## 2023-08-17 DIAGNOSIS — K219 Gastro-esophageal reflux disease without esophagitis: Secondary | ICD-10-CM | POA: Diagnosis present

## 2023-08-17 DIAGNOSIS — Z9861 Coronary angioplasty status: Secondary | ICD-10-CM | POA: Diagnosis not present

## 2023-08-17 DIAGNOSIS — Z950 Presence of cardiac pacemaker: Secondary | ICD-10-CM

## 2023-08-17 DIAGNOSIS — I130 Hypertensive heart and chronic kidney disease with heart failure and stage 1 through stage 4 chronic kidney disease, or unspecified chronic kidney disease: Principal | ICD-10-CM | POA: Diagnosis present

## 2023-08-17 DIAGNOSIS — I959 Hypotension, unspecified: Secondary | ICD-10-CM | POA: Diagnosis present

## 2023-08-17 DIAGNOSIS — Z7982 Long term (current) use of aspirin: Secondary | ICD-10-CM

## 2023-08-17 DIAGNOSIS — I5023 Acute on chronic systolic (congestive) heart failure: Secondary | ICD-10-CM | POA: Diagnosis not present

## 2023-08-17 DIAGNOSIS — J439 Emphysema, unspecified: Secondary | ICD-10-CM | POA: Diagnosis not present

## 2023-08-17 LAB — CBC WITH DIFFERENTIAL/PLATELET
Abs Immature Granulocytes: 0.03 K/uL (ref 0.00–0.07)
Basophils Absolute: 0 K/uL (ref 0.0–0.1)
Basophils Relative: 0 %
Eosinophils Absolute: 0 K/uL (ref 0.0–0.5)
Eosinophils Relative: 1 %
HCT: 34.9 % — ABNORMAL LOW (ref 36.0–46.0)
Hemoglobin: 10.6 g/dL — ABNORMAL LOW (ref 12.0–15.0)
Immature Granulocytes: 0 %
Lymphocytes Relative: 15 %
Lymphs Abs: 1.1 K/uL (ref 0.7–4.0)
MCH: 29.8 pg (ref 26.0–34.0)
MCHC: 30.4 g/dL (ref 30.0–36.0)
MCV: 98 fL (ref 80.0–100.0)
Monocytes Absolute: 0.9 K/uL (ref 0.1–1.0)
Monocytes Relative: 12 %
Neutro Abs: 5.4 K/uL (ref 1.7–7.7)
Neutrophils Relative %: 72 %
Platelets: 257 K/uL (ref 150–400)
RBC: 3.56 MIL/uL — ABNORMAL LOW (ref 3.87–5.11)
RDW: 18.3 % — ABNORMAL HIGH (ref 11.5–15.5)
WBC: 7.5 K/uL (ref 4.0–10.5)
nRBC: 0 % (ref 0.0–0.2)

## 2023-08-17 LAB — COMPREHENSIVE METABOLIC PANEL WITH GFR
ALT: 21 U/L (ref 0–44)
AST: 24 U/L (ref 15–41)
Albumin: 2.6 g/dL — ABNORMAL LOW (ref 3.5–5.0)
Alkaline Phosphatase: 82 U/L (ref 38–126)
Anion gap: 11 (ref 5–15)
BUN: 51 mg/dL — ABNORMAL HIGH (ref 8–23)
CO2: 31 mmol/L (ref 22–32)
Calcium: 8.1 mg/dL — ABNORMAL LOW (ref 8.9–10.3)
Chloride: 96 mmol/L — ABNORMAL LOW (ref 98–111)
Creatinine, Ser: 2.04 mg/dL — ABNORMAL HIGH (ref 0.44–1.00)
GFR, Estimated: 23 mL/min — ABNORMAL LOW (ref >60)
Glucose, Bld: 109 mg/dL — ABNORMAL HIGH (ref 70–99)
Potassium: 3.7 mmol/L (ref 3.5–5.1)
Sodium: 138 mmol/L (ref 135–145)
Total Bilirubin: 0.6 mg/dL (ref 0.0–1.2)
Total Protein: 6.3 g/dL — ABNORMAL LOW (ref 6.5–8.1)

## 2023-08-17 LAB — TROPONIN I (HIGH SENSITIVITY)
Troponin I (High Sensitivity): 47 ng/L — ABNORMAL HIGH
Troponin I (High Sensitivity): 49 ng/L — ABNORMAL HIGH (ref <18)

## 2023-08-17 LAB — BRAIN NATRIURETIC PEPTIDE: B Natriuretic Peptide: 2202.6 pg/mL — ABNORMAL HIGH (ref 0.0–100.0)

## 2023-08-17 MED ORDER — ATORVASTATIN CALCIUM 40 MG PO TABS
40.0000 mg | ORAL_TABLET | Freq: Every day | ORAL | Status: DC
  Administered 2023-08-17 – 2023-08-25 (×12): 40 mg via ORAL
  Filled 2023-08-17 (×9): qty 1

## 2023-08-17 MED ORDER — SENNA 8.6 MG PO TABS
1.0000 | ORAL_TABLET | Freq: Every day | ORAL | Status: DC | PRN
  Administered 2023-08-25: 8.6 mg via ORAL
  Filled 2023-08-17: qty 1

## 2023-08-17 MED ORDER — PANTOPRAZOLE SODIUM 40 MG PO TBEC
40.0000 mg | DELAYED_RELEASE_TABLET | Freq: Two times a day (BID) | ORAL | Status: DC
  Administered 2023-08-17 – 2023-08-26 (×24): 40 mg via ORAL
  Filled 2023-08-17 (×18): qty 1

## 2023-08-17 MED ORDER — MELATONIN 3 MG PO TABS
9.0000 mg | ORAL_TABLET | Freq: Every evening | ORAL | Status: DC | PRN
  Administered 2023-08-17 – 2023-08-25 (×7): 9 mg via ORAL
  Filled 2023-08-17 (×7): qty 3

## 2023-08-17 MED ORDER — ACETAMINOPHEN 650 MG RE SUPP
650.0000 mg | Freq: Four times a day (QID) | RECTAL | Status: DC | PRN
Start: 2023-08-17 — End: 2023-08-26

## 2023-08-17 MED ORDER — SODIUM CHLORIDE 0.9% FLUSH
3.0000 mL | Freq: Two times a day (BID) | INTRAVENOUS | Status: DC
  Administered 2023-08-17 – 2023-08-26 (×24): 3 mL via INTRAVENOUS

## 2023-08-17 MED ORDER — HEPARIN SODIUM (PORCINE) 5000 UNIT/ML IJ SOLN
5000.0000 [IU] | Freq: Three times a day (TID) | INTRAMUSCULAR | Status: DC
  Administered 2023-08-17 – 2023-08-26 (×35): 5000 [IU] via SUBCUTANEOUS
  Filled 2023-08-17 (×26): qty 1

## 2023-08-17 MED ORDER — FLUTICASONE FUROATE-VILANTEROL 200-25 MCG/ACT IN AEPB
1.0000 | INHALATION_SPRAY | Freq: Every day | RESPIRATORY_TRACT | Status: DC
  Administered 2023-08-19 – 2023-08-26 (×11): 1 via RESPIRATORY_TRACT
  Filled 2023-08-17: qty 28

## 2023-08-17 MED ORDER — MESALAMINE ER 250 MG PO CPCR
1000.0000 mg | ORAL_CAPSULE | Freq: Two times a day (BID) | ORAL | Status: DC
  Administered 2023-08-17 – 2023-08-26 (×24): 1000 mg via ORAL
  Filled 2023-08-17 (×18): qty 4

## 2023-08-17 MED ORDER — PROCHLORPERAZINE EDISYLATE 10 MG/2ML IJ SOLN
5.0000 mg | Freq: Four times a day (QID) | INTRAMUSCULAR | Status: DC | PRN
  Administered 2023-08-18 – 2023-08-26 (×11): 5 mg via INTRAVENOUS
  Filled 2023-08-17 (×10): qty 1

## 2023-08-17 MED ORDER — FUROSEMIDE 10 MG/ML IJ SOLN
40.0000 mg | Freq: Once | INTRAMUSCULAR | Status: AC
  Administered 2023-08-17: 40 mg via INTRAVENOUS
  Filled 2023-08-17: qty 4

## 2023-08-17 MED ORDER — FUROSEMIDE 10 MG/ML IJ SOLN
40.0000 mg | Freq: Two times a day (BID) | INTRAMUSCULAR | Status: DC
  Administered 2023-08-18: 40 mg via INTRAVENOUS
  Filled 2023-08-17: qty 4

## 2023-08-17 MED ORDER — IPRATROPIUM-ALBUTEROL 0.5-2.5 (3) MG/3ML IN SOLN: 3.0000 mL | Freq: Four times a day (QID) | RESPIRATORY_TRACT | Status: DC | PRN

## 2023-08-17 MED ORDER — ASPIRIN 81 MG PO TBEC
81.0000 mg | DELAYED_RELEASE_TABLET | Freq: Every day | ORAL | Status: DC
  Administered 2023-08-18 – 2023-08-26 (×12): 81 mg via ORAL
  Filled 2023-08-17 (×9): qty 1

## 2023-08-17 MED ORDER — ACETAMINOPHEN 325 MG PO TABS
650.0000 mg | ORAL_TABLET | Freq: Four times a day (QID) | ORAL | Status: DC | PRN
  Administered 2023-08-18 – 2023-08-25 (×6): 650 mg via ORAL
  Filled 2023-08-17 (×6): qty 2

## 2023-08-17 NOTE — ED Triage Notes (Signed)
 Patient BIB [facility] from [facility] due to fluid overload. Patient states she has gained 3 pounds recently and can feel she is holding on to fluid. States she has had this happened in the past and has to come to the hospital for IV lasix . Patient takes diuretics as prescribed. A&Ox4 VSS with EMS. No complaints of CP or SOB. Patient wears 5LNC at baseline.

## 2023-08-17 NOTE — H&P (Signed)
 History and Physical    Felicia Benson FMW:[id number] DOB: 04/18/1930 DOA: 08/17/2023  PCP: [facility]   Patient coming from: SNF   Chief Complaint: Weight gain, DOE, leg swelling  HPI: Felicia Benson is a 89 y.o. female with medical history significant for Crohn's disease, COPD, OSA, chronic hypoxic respiratory failure, AAA, CKD 3B, history of DVT and PE with IVC filter and not anticoagulated due to GI bleeding, now presenting with weight gain, leg swelling, and exertional dyspnea.  Patient reports strict adherence with her diuretics but reports gaining weight recently, developing increased bilateral lower extremity edema, and progressive exertional dyspnea.  She has had increased cough as well but no change in sputum production, fever, or chills.  She denies chest pain or abdominal pain.  ED Course: Upon arrival to the ED, patient is found to be afebrile and saturating low 90s on 5 L/min of supplemental oxygen with normal HR and stable BP.  Labs are most notable for BUN 57, creatinine 2.04, albumin  2.6, troponin 47, and BNP 2203.  Chest x-ray notable for small left pleural effusion.  Patient was treated with 40 mg IV Lasix  in the ED.  Review of Systems:  All other systems reviewed and apart from HPI, are negative.  Past Medical History:  Diagnosis Date   AAA (abdominal aortic aneurysm) (HCC)    CHF (congestive heart failure) (HCC)    Crohn's disease (HCC)    Diverticulitis    Diverticulosis    GERD (gastroesophageal reflux disease)    GI bleed    Hypertension    OSA (obstructive sleep apnea)    Pacemaker    Thyroid nodule     Past Surgical History:  Procedure Laterality Date   CHOLECYSTECTOMY     COLONOSCOPY WITH PROPOFOL  N/A 06/01/2015   Procedure: COLONOSCOPY WITH PROPOFOL ;  Surgeon: Jerrell Sol, MD;  Location: [facility] ENDOSCOPY;  Service: Endoscopy;  Laterality: N/A;   CORONARY ANGIOPLASTY WITH STENT PLACEMENT     ESOPHAGOGASTRODUODENOSCOPY (EGD) WITH PROPOFOL  N/A  06/01/2015   Procedure: ESOPHAGOGASTRODUODENOSCOPY (EGD) WITH PROPOFOL ;  Surgeon: Jerrell Sol, MD;  Location: [facility] ENDOSCOPY;  Service: Endoscopy;  Laterality: N/A;   GIVENS CAPSULE STUDY N/A 06/03/2015   Procedure: GIVENS CAPSULE STUDY;  Surgeon: Jerrell Sol, MD;  Location: [facility] ENDOSCOPY;  Service: Endoscopy;  Laterality: N/A;   INTRAMEDULLARY (IM) NAIL INTERTROCHANTERIC Right 10/30/2022   Procedure: INTRAMEDULLARY nailing of right femur;  Surgeon: Edna Toribio LABOR, MD;  Location: [facility] OR;  Service: Orthopedics;  Laterality: Right;   IR IVC FILTER PLMT / S&I /IMG GUID/MOD SED  02/11/2023   PACEMAKER PLACEMENT  2015    Social History:   reports that she has quit smoking. She has never used smokeless tobacco. She reports that she does not drink alcohol  and does not use drugs.  Allergies  Allergen Reactions   Motrin [Ibuprofen] Other (See Comments)    GI bleed   Ace Inhibitors Cough   Breztri  Aerosphere [Budeson-Glycopyrrol-Formoterol ] Hypertension   Cipro [Ciprofloxacin Hcl] Other (See Comments)    Avoid fluoroquinolones due to asc-aortic aneurysm   Levaquin [Levofloxacin] Other (See Comments)    Avoid fluoroquinolones due to asc-aortic aneurysm   Nsaids Nausea And Vomiting and Other (See Comments)    Hx of GI bleed    Family History  Problem Relation Age of Onset   CVA Mother    Lung cancer Father    Colon cancer Neg Hx      Prior to Admission medications   Medication Sig Start Date End Date  Taking? Authorizing Provider  acetaminophen  (TYLENOL ) 500 MG tablet Take 1,000 mg by mouth at bedtime. Give with melatonin    [provider]  aspirin  EC 81 MG tablet Take 1 tablet (81 mg total) by mouth daily. Swallow whole. 03/29/21   Rosemarie Eather RAMAN, MD  atorvastatin  (LIPITOR) 40 MG tablet Take 40 mg by mouth at bedtime.    [provider]  BREO ELLIPTA  200-25 MCG/ACT AEPB Inhale 1 puff into the lungs daily. 08/02/23   [provider]  cetirizine  (ZYRTEC) 10 MG tablet Take 10 mg by mouth in the morning.    [provider]  cholecalciferol  (VITAMIN D3) 25 MCG (1000 UNIT) tablet Take 1,000 Units by mouth in the morning.    [provider]  dextromethorphan  (DELSYM ) 30 MG/5ML liquid Take 10 mLs by mouth 2 (two) times daily as needed for cough.    [provider]  docusate sodium  (COLACE) 100 MG capsule Take 200 mg by mouth in the morning.    [provider]  liver oil-zinc  oxide (DESITIN) 40 % ointment Apply topically 2 (two) times daily. 07/01/23   Samtani, Jai-Gurmukh, MD  melatonin 3 MG TABS tablet Take 9 mg by mouth at bedtime.    [provider]  midodrine  (PROAMATINE ) 5 MG tablet Take 1 tablet (5 mg total) by mouth 2 (two) times daily with a meal. 07/29/23   Fairy Frames, MD  mometasone -formoterol  (DULERA ) 100-5 MCG/ACT AERO Inhale 2 puffs into the lungs 2 (two) times daily.    [provider]  montelukast  (SINGULAIR ) 10 MG tablet Take 1 tablet (10 mg total) by mouth daily. 08/01/21   Neda Jennet LABOR, MD  ondansetron  (ZOFRAN ) 4 MG tablet Take 4 mg by mouth every 6 (six) hours as needed for nausea or vomiting.    [provider]  OXYGEN Inhale 4 L into the lungs continuous. Patient taking differently: Inhale 5 L into the lungs continuous.    [provider]  pantoprazole  (PROTONIX ) 40 MG tablet Take 40 mg by mouth in the morning and at bedtime.    [provider]  PENTASA  500 MG CR capsule Take 1,000 mg by mouth in the morning and at bedtime.    [provider]  polyethylene glycol (MIRALAX  / GLYCOLAX ) 17 g packet Take 17 g by mouth 2 (two) times daily as needed for mild constipation or moderate constipation.    [provider]  saccharomyces boulardii (FLORASTOR) 250 MG capsule Take 250 mg by mouth 2 (two) times daily.    [provider]  torsemide  (DEMADEX ) 20 MG tablet Take 2 tablets (40 mg total) by mouth 2 (two) times daily.  08/08/23   Swinyer, Rosaline HERO, NP    Physical Exam: Vitals:   08/17/23 2030 08/17/23 2045 08/17/23 2100 08/17/23 2115  BP: 108/77  110/75 103/79  Pulse: 91  77 87  Resp:  14 15 16   Temp:      TempSrc:      SpO2: 92%  97% 98%  Weight:      Height:        Constitutional: NAD, no pallor or diaphoresis  Eyes: PERTLA, lids and conjunctivae normal ENMT: Mucous membranes are moist. Posterior pharynx clear of any exudate or lesions.   Neck: supple, no masses  Respiratory: Dyspneic with speech. No wheezing.   Cardiovascular: S1 & S2 heard, regular rate and rhythm. Bilateral lower extremity edema.  Abdomen: No tenderness, soft. Bowel sounds active.  Musculoskeletal: no clubbing / cyanosis.  No joint deformity upper and lower extremities.   Skin: no significant rashes, lesions, ulcers. Warm, dry, well-perfused. Neurologic: CN 2-12 grossly intact. Moving all extremities. Sleeping. Wakes to voice and is oriented to person, place, and situation.  Psychiatric: Calm. Cooperative.    Labs and Imaging on Admission: I have personally reviewed following labs and imaging studies  CBC: Recent Labs  Lab 08/17/23 1953  WBC 7.5  NEUTROABS 5.4  HGB 10.6*  HCT 34.9*  MCV 98.0  PLT 257   Basic Metabolic Panel: Recent Labs  Lab 08/17/23 1953  NA 138  K 3.7  CL 96*  CO2 31  GLUCOSE 109*  BUN 51*  CREATININE 2.04*  CALCIUM  8.1*   GFR: Estimated Creatinine Clearance: 20 mL/min (A) (by C-G formula based on SCr of 2.04 mg/dL (H)). Liver Function Tests: Recent Labs  Lab 08/17/23 1953  AST 24  ALT 21  ALKPHOS 82  BILITOT 0.6  PROT 6.3*  ALBUMIN  2.6*   No results for input(s): LIPASE, AMYLASE in the last 168 hours. No results for input(s): AMMONIA in the last 168 hours. Coagulation Profile: No results for input(s): INR, PROTIME in the last 168 hours. Cardiac Enzymes: No results for input(s): CKTOTAL, CKMB, CKMBINDEX, TROPONINI in the last 168 hours. BNP (last 3  results) No results for input(s): PROBNP in the last 8760 hours. HbA1C: No results for input(s): HGBA1C in the last 72 hours. CBG: No results for input(s): GLUCAP in the last 168 hours. Lipid Profile: No results for input(s): CHOL, HDL, LDLCALC, TRIG, CHOLHDL, LDLDIRECT in the last 72 hours. Thyroid Function Tests: No results for input(s): TSH, T4TOTAL, FREET4, T3FREE, THYROIDAB in the last 72 hours. Anemia Panel: No results for input(s): VITAMINB12, FOLATE, FERRITIN, TIBC, IRON, RETICCTPCT in the last 72 hours. Urine analysis:    Component Value Date/Time   COLORURINE YELLOW 12/31/2020 0100   APPEARANCEUR CLEAR 12/31/2020 0100   LABSPEC 1.020 12/31/2020 0100   PHURINE 6.0 12/31/2020 0100   GLUCOSEU NEGATIVE 12/31/2020 0100   HGBUR NEGATIVE 12/31/2020 0100   BILIRUBINUR NEGATIVE 12/31/2020 0100   KETONESUR NEGATIVE 12/31/2020 0100   PROTEINUR NEGATIVE 12/31/2020 0100   NITRITE POSITIVE (A) 12/31/2020 0100   LEUKOCYTESUR NEGATIVE 12/31/2020 0100   Sepsis Labs: @LABRCNTIP (procalcitonin:4,lacticidven:4) )No results found for this or any previous visit (from the past 240 hours).   Radiological Exams on Admission: DG Chest Portable 1 View Result Date: 08/17/2023 CLINICAL DATA:  Shortness of breath on exertion EXAM: PORTABLE CHEST 1 VIEW COMPARISON:  07/23/2023 FINDINGS: Cardiac shadow is enlarged but stable. Pacing device is again seen. Mild central vascular congestion is noted. Small left effusion with basilar airspace opacity is noted. Mild right basilar scar is seen and stable. IMPRESSION: Small left effusion with basilar airspace opacity. Mild right basilar scarring. Electronically Signed   By: Oneil Devonshire M.D.   On: 08/17/2023 20:22    Assessment/Plan  1. Acute on chronic HFmrEF  - Continue diuresis with IV Lasix , monitor strict I/Os and daily weights, monitor renal function and electrolytes    2. AKI superimposed on CKD 3B  -  Renally-dose medications, monitor closely while diuresing    3. COPD; OSA; chronic hypoxic respiratory failure  - Continue ICS-LABA, CPAP while sleeping, supplemental O2, and short-acting bronchodilators as-needed   4. Chronh'sn disease  - Continue Pentasa     DVT prophylaxis: sq heparin   Code Status: DNR  Level of Care: Level of care: Telemetry Cardiac Family Communication: None present   Disposition Plan:  Patient is from:  SNF  Anticipated d/c is to: SNF  Anticipated d/c date is: 08/20/23  Patient currently: Pending improved volume status, stable renal function  Consults called: None  Admission status: Inpatient     Evalene GORMAN Sprinkles, MD [facility]  08/17/2023, 9:34 PM

## 2023-08-17 NOTE — ED Provider Notes (Signed)
 [place] [facility] Provider Note   CSN: [id number] Arrival date & time: 08/17/23  1924     Patient presents with: CHF Exacerbation   Felicia Benson is a 89 y.o. female with past medical history of Crohn's, GIB, HTN, CHF, COPD, AAA, HLD, TIA, CKD stage III, OSA, anemia, DVT, PE (IVC filter) presents to the Emergency Department for evaluation of weight gain of 6 pounds, shortness of breath with exertion, edema of legs and left arm that she noticed over the past week.  Endorses that she feels short of breath while doing exercises at rehab when she does not normally.  She states that she normally sats around 90-93% on her typical 5 L O2. Has been complaint with lasix  40mg  BID. Has also had a clear sputum productive cough for the past 2 weeks.  Denies chest pain, fever  Of note, was admitted on 06/25/2023 for COPD exacerbation and 07/23/2023 for HF exacerbation   HPI     Prior to Admission medications   Medication Sig Start Date End Date Taking? Authorizing Provider  acetaminophen  (TYLENOL ) 500 MG tablet Take 1,000 mg by mouth at bedtime. Give with melatonin    [provider]  aspirin  EC 81 MG tablet Take 1 tablet (81 mg total) by mouth daily. Swallow whole. 03/29/21   Rosemarie Eather RAMAN, MD  atorvastatin  (LIPITOR) 40 MG tablet Take 40 mg by mouth at bedtime.    [provider]  BREO ELLIPTA  200-25 MCG/ACT AEPB Inhale 1 puff into the lungs daily. 08/02/23   [provider]  cetirizine (ZYRTEC) 10 MG tablet Take 10 mg by mouth in the morning.    [provider]  cholecalciferol  (VITAMIN D3) 25 MCG (1000 UNIT) tablet Take 1,000 Units by mouth in the morning.    [provider]  dextromethorphan  (DELSYM ) 30 MG/5ML liquid Take 10 mLs by mouth 2 (two) times daily as needed for cough.    [provider]  docusate sodium  (COLACE) 100 MG capsule Take 200 mg by mouth in the morning.    [provider]   liver oil-zinc  oxide (DESITIN) 40 % ointment Apply topically 2 (two) times daily. 07/01/23   Samtani, Jai-Gurmukh, MD  melatonin 3 MG TABS tablet Take 9 mg by mouth at bedtime.    [provider]  midodrine  (PROAMATINE ) 5 MG tablet Take 1 tablet (5 mg total) by mouth 2 (two) times daily with a meal. 07/29/23   Fairy Frames, MD  mometasone -formoterol  (DULERA ) 100-5 MCG/ACT AERO Inhale 2 puffs into the lungs 2 (two) times daily.    [provider]  montelukast  (SINGULAIR ) 10 MG tablet Take 1 tablet (10 mg total) by mouth daily. 08/01/21   [name], Jennet LABOR, MD  ondansetron  (ZOFRAN ) 4 MG tablet Take 4 mg by mouth every 6 (six) hours as needed for nausea or vomiting.    [provider]  OXYGEN Inhale 4 L into the lungs continuous. Patient taking differently: Inhale 5 L into the lungs continuous.    [provider]  pantoprazole  (PROTONIX ) 40 MG tablet Take 40 mg by mouth in the morning and at bedtime.    [provider]  PENTASA  500 MG CR capsule Take 1,000 mg by mouth in the morning and at bedtime.    [provider]  polyethylene glycol (MIRALAX  / GLYCOLAX ) 17 g packet Take 17 g by mouth 2 (two) times daily as needed for mild constipation or moderate constipation.    [provider]  saccharomyces boulardii (FLORASTOR) 250 MG capsule Take 250 mg by mouth 2 (two) times daily.    [provider]  torsemide  (DEMADEX ) 20 MG tablet Take 2 tablets (40 mg total) by mouth 2 (two) times daily. 08/08/23   Swinyer, Rosaline HERO, NP    Allergies: Motrin [ibuprofen], Ace inhibitors, Breztri  aerosphere [budeson-glycopyrrol-formoterol ], Cipro [ciprofloxacin hcl], Levaquin [levofloxacin], and Nsaids    Review of Systems  Respiratory:  Positive for shortness of breath.     Updated Vital Signs BP 103/79   Pulse 87   Temp 97.8 F (36.6 C) (Oral)   Resp 16   Ht 5' 6 (1.676 m)   Wt 87 kg   SpO2 98%   BMI 30.96 kg/m   Physical  Exam Vitals and nursing note reviewed.  Constitutional:      General: She is not in acute distress.    Appearance: Normal appearance.  HENT:     Head: Normocephalic and atraumatic.  Eyes:     Conjunctiva/sclera: Conjunctivae normal.  Cardiovascular:     Rate and Rhythm: Normal rate.  Pulmonary:     Effort: Pulmonary effort is normal. No respiratory distress.     Breath sounds: Examination of the right-lower field reveals rales. Examination of the left-lower field reveals rales. Rales present.  Musculoskeletal:     Right lower leg: 1+ Edema present.     Left lower leg: 2+ Edema present.  Skin:    Coloration: Skin is not jaundiced or pale.  Neurological:     Mental Status: She is alert. Mental status is at baseline.     (all labs ordered are listed, but only abnormal results are displayed) Labs Reviewed  BRAIN NATRIURETIC PEPTIDE - Abnormal; Notable for the following components:      Result Value   B Natriuretic Peptide 2,202.6 (*)    All other components within normal limits  CBC WITH DIFFERENTIAL/PLATELET - Abnormal; Notable for the following components:   RBC 3.56 (*)    Hemoglobin 10.6 (*)    HCT 34.9 (*)    RDW 18.3 (*)    All other components within normal limits  COMPREHENSIVE METABOLIC PANEL WITH GFR - Abnormal; Notable for the following components:   Chloride 96 (*)    Glucose, Bld 109 (*)    BUN 51 (*)    Creatinine, Ser 2.04 (*)    Calcium  8.1 (*)    Total Protein 6.3 (*)    Albumin  2.6 (*)    GFR, Estimated 23 (*)    All other components within normal limits  TROPONIN I (HIGH SENSITIVITY) - Abnormal; Notable for the following components:   Troponin I (High Sensitivity) 47 (*)    All other components within normal limits  TROPONIN I (HIGH SENSITIVITY)    EKG: None  Radiology: DG Chest Portable 1 View Result Date: 08/17/2023 CLINICAL DATA:  Shortness of breath on exertion EXAM: PORTABLE CHEST 1 VIEW COMPARISON:  07/23/2023 FINDINGS: Cardiac shadow is  enlarged but stable. Pacing device is again seen. Mild central vascular congestion is noted. Small left effusion with basilar airspace opacity is noted. Mild right basilar scar is seen and stable. IMPRESSION: Small left effusion with basilar airspace opacity. Mild right basilar scarring. Electronically Signed   By: Oneil Devonshire M.D.   On: 08/17/2023 20:22     Medications Ordered in the ED  furosemide  (LASIX ) injection 40 mg (40 mg Intravenous Given 08/17/23 2027)  Medical Decision Making Amount and/or Complexity of Data Reviewed Labs: ordered. Radiology: ordered.  Risk Prescription drug management. Decision regarding hospitalization.   Patient presents to the ED for concern of SHOB, edema, this involves an extensive number of treatment options, and is a complaint that carries with it a high risk of complications and morbidity.  The differential diagnosis includes COPD exacerbation, fluid overload, heart failure exacerbation, DVT   Co morbidities that complicate the patient evaluation  Past medical history of HF   Additional history obtained:  Additional history obtained from Nursing and Past Admission   External records from outside source obtained and reviewed including triage RN note, recent admission   Lab Tests:  I Ordered, and personally interpreted labs.  The pertinent results include:   CBG 109 Creatinine 2.04 (was 1.49 two weeks ago but baseline is 1.49-1.73 over past 3) Troponin 47 (baseline 41-1 03 over past 5 months) BNP 02/10/2000 Hgb 10.6   Imaging Studies ordered:  I ordered imaging studies including CXR  I independently visualized and interpreted imaging which showed  Small left effusion with basilar airspace opacity.  Mild right basilar scarring I agree with the radiologist interpretation   Cardiac Monitoring:  The patient was maintained on a cardiac monitor.      Medicines ordered and prescription drug  management:  I ordered medication including lasix   for diuresis  Reevaluation of the patient after these medicines showed that the patient improved I have reviewed the patients home medicines and have made adjustments as needed     Consultations Obtained:  I requested consultation with hopsitalist Dr. Charlton,  and discussed lab and imaging findings as well as pertinent plan - accepts patient for admission   Problem List / ED Course:  [facility] Echo on 07/24/2023 shows LVEF 45 to 50% with global hypokinesis of LV 1+ RLE and 2+ LLE nonpitting edema. CXR shows small effusion Lung sounds with rales in lower bases bilaterally Sats ranging from 91-97% on her normal 5L [place] BNP 2202. Mildly elevated creatinine from 2 weeks ago Provided lasix  40mg  IV for diuresis No complaints of CP. Troponin mildly elevated per her baseline likely 2/2 to CHF, demand ischemia    Reevaluation:  After the interventions noted above, I reevaluated the patient and found that they have :improved   Social Determinants of Health:  Former tobacco use   Dispostion:  After consideration of the diagnostic results and the patients response to treatment, I feel that the patent would benefit from admission for IV diuresis.   Discussed ED workup, disposition, plan with patient expressed understanding agrees with plan.  All questions answered to her satisfaction.  She is agreeable to admission  Final diagnoses:  Acute on chronic congestive heart failure, unspecified heart failure type [facility])    ED Discharge Orders     None        Minnie Tinnie BRAVO, PA 08/17/23 2133    Charlyn Sora, MD 08/18/23 1126

## 2023-08-17 NOTE — Progress Notes (Signed)
 The patient is admitted from ED to 2 C 08. A & O x 4. She denies any acute pain. The patient is oriented to staff, acom and call bell. Full assessment to epic completed. Will continue to monitor.

## 2023-08-18 DIAGNOSIS — E66811 Obesity, class 1: Secondary | ICD-10-CM

## 2023-08-18 DIAGNOSIS — Z86711 Personal history of pulmonary embolism: Secondary | ICD-10-CM

## 2023-08-18 DIAGNOSIS — Z9861 Coronary angioplasty status: Secondary | ICD-10-CM

## 2023-08-18 DIAGNOSIS — I10 Essential (primary) hypertension: Secondary | ICD-10-CM | POA: Diagnosis not present

## 2023-08-18 DIAGNOSIS — E782 Mixed hyperlipidemia: Secondary | ICD-10-CM

## 2023-08-18 DIAGNOSIS — I5033 Acute on chronic diastolic (congestive) heart failure: Secondary | ICD-10-CM

## 2023-08-18 DIAGNOSIS — I2510 Atherosclerotic heart disease of native coronary artery without angina pectoris: Secondary | ICD-10-CM | POA: Diagnosis not present

## 2023-08-18 DIAGNOSIS — N1832 Chronic kidney disease, stage 3b: Secondary | ICD-10-CM | POA: Diagnosis not present

## 2023-08-18 DIAGNOSIS — D638 Anemia in other chronic diseases classified elsewhere: Secondary | ICD-10-CM

## 2023-08-18 DIAGNOSIS — K219 Gastro-esophageal reflux disease without esophagitis: Secondary | ICD-10-CM

## 2023-08-18 DIAGNOSIS — J439 Emphysema, unspecified: Secondary | ICD-10-CM

## 2023-08-18 LAB — BASIC METABOLIC PANEL WITH GFR
Anion gap: 10 (ref 5–15)
BUN: 48 mg/dL — ABNORMAL HIGH (ref 8–23)
CO2: 32 mmol/L (ref 22–32)
Calcium: 8.1 mg/dL — ABNORMAL LOW (ref 8.9–10.3)
Chloride: 96 mmol/L — ABNORMAL LOW (ref 98–111)
Creatinine, Ser: 1.84 mg/dL — ABNORMAL HIGH (ref 0.44–1.00)
GFR, Estimated: 26 mL/min — ABNORMAL LOW (ref >60)
Glucose, Bld: 98 mg/dL (ref 70–99)
Potassium: 3.7 mmol/L (ref 3.5–5.1)
Sodium: 138 mmol/L (ref 135–145)

## 2023-08-18 LAB — CBC
HCT: 31.5 % — ABNORMAL LOW (ref 36.0–46.0)
Hemoglobin: 9.8 g/dL — ABNORMAL LOW (ref 12.0–15.0)
MCH: 29.7 pg (ref 26.0–34.0)
MCHC: 31.1 g/dL (ref 30.0–36.0)
MCV: 95.5 fL (ref 80.0–100.0)
Platelets: 229 K/uL (ref 150–400)
RBC: 3.3 MIL/uL — ABNORMAL LOW (ref 3.87–5.11)
RDW: 18.2 % — ABNORMAL HIGH (ref 11.5–15.5)
WBC: 7.2 K/uL (ref 4.0–10.5)
nRBC: 0 % (ref 0.0–0.2)

## 2023-08-18 LAB — MAGNESIUM: Magnesium: 1.6 mg/dL — ABNORMAL LOW (ref 1.7–2.4)

## 2023-08-18 LAB — MRSA NEXT GEN BY PCR, NASAL: MRSA by PCR Next Gen: DETECTED — AB

## 2023-08-18 MED ORDER — MUPIROCIN 2 % EX OINT
1.0000 | TOPICAL_OINTMENT | Freq: Two times a day (BID) | CUTANEOUS | Status: AC
  Administered 2023-08-18 – 2023-08-22 (×16): 1 via NASAL
  Filled 2023-08-18 (×3): qty 22

## 2023-08-18 MED ORDER — FUROSEMIDE 10 MG/ML IJ SOLN
60.0000 mg | Freq: Two times a day (BID) | INTRAMUSCULAR | Status: DC
  Administered 2023-08-18 – 2023-08-21 (×9): 60 mg via INTRAVENOUS
  Filled 2023-08-18 (×6): qty 6

## 2023-08-18 MED ORDER — MIDODRINE HCL 5 MG PO TABS
5.0000 mg | ORAL_TABLET | Freq: Two times a day (BID) | ORAL | Status: DC
  Administered 2023-08-18 – 2023-08-26 (×22): 5 mg via ORAL
  Filled 2023-08-18 (×16): qty 1

## 2023-08-18 MED ORDER — CHLORHEXIDINE GLUCONATE CLOTH 2 % EX PADS
6.0000 | MEDICATED_PAD | Freq: Every day | CUTANEOUS | Status: AC
  Administered 2023-08-18 – 2023-08-22 (×8): 6 via TOPICAL

## 2023-08-18 NOTE — Assessment & Plan Note (Addendum)
 07/2023 echocardiogram with mild reduction in LV systolic function 45 to 50%, global hypokinesis, severe asymmetric left ventricular hypertrophy of the septal segment, normal size left and right atriums, no pericardial effusion,  interventricular septum is flattened in systole and diastole, mild reduction RV systolic function, moderate to severe mitral valve regurgitation, moderate tricuspid valve regurgitation, mild aortic regurgitation.   Acute on chronic core pulmonale Pulmonary hypertension  RV failure.   Urine output 1750 ml.  Systolic blood pressure 100 mmHg range  Clinically edema is improving.   Continue with furosemide  to 60 mg IV bid  Continue with midodrine  5 mg tid to allow better diuresis.  Limited medical therapy due to reduced GFR and risk of hypotension.   Acute on chronic hypoxemic respiratory failure, due to acute cardiogenic pulmonary edema. Continue diuresis, supplemental 02 per [place] to keep 02 saturation greater than 88%

## 2023-08-18 NOTE — Assessment & Plan Note (Signed)
 No acute coronary syndrome High sensitive troponin elevation due to heart failure.  Continue aspirin  and statin

## 2023-08-18 NOTE — Progress Notes (Signed)
 MRSA by PCR came positive. Notified Dr. Von and initiated the standing order for nasal ABT treatment. Will continue to monitor.

## 2023-08-18 NOTE — Assessment & Plan Note (Signed)
 Continue blood pressure monitoring Will add midodrine  for blood pressure support.

## 2023-08-18 NOTE — Plan of Care (Signed)

## 2023-08-18 NOTE — Assessment & Plan Note (Addendum)
 No signs of active flare.  Continue with mesalamine   Follow up as outpatient

## 2023-08-18 NOTE — Assessment & Plan Note (Signed)
 Hgb stable at 9,8  Follow up cell count.

## 2023-08-18 NOTE — Assessment & Plan Note (Signed)
 S/P IVC filter

## 2023-08-18 NOTE — Assessment & Plan Note (Signed)
No signs of acute exacerbation, continue with bronchodilator therapy.

## 2023-08-18 NOTE — Assessment & Plan Note (Signed)
Calculated BMI is 32.4

## 2023-08-18 NOTE — Hospital Course (Addendum)
 Felicia Benson was admitted to the hospital with the working diagnosis of heart failure exacerbation.   89 yo female, nursing home resident, with the past medical history of Crohn's disease, COPD, CKD stage 3B, history of DVT and PE sp IVC filter, who presented with dyspnea, lower extremity edema and weight gain. Progressive worsening symptoms over several days, with 3 lbs weight gain in 3 days. EMS was called and patient was brought to the hospital.  On his initial physical examination his blood pressure was 108/77, HR 91, RR 14 and 02 saturation 92%  Lungs with no wheezing or rhonchi, heart with S1 and S2 present and regular, with no murmurs or gallops, abdomen with no distention and positive bilateral lower extremity edema.   Na 138, K 3,7 Cl 96 bicarbonate 31 glucose 109, bun 51 cr 2,0  AST 24 and ALT 21  BNP 2,202  High sensitive troponin 47 and 49  Wbc 7,5 hgb 10,6 plt 257   Chest radiograph with hypoinflation, left rotation, cardiomegaly, bilateral hilar vascular congestion, bilateral basal interstitial infiltrates, right basal atelectasis, with small left pleural effusion. Pacemaker in place with one right artrial and on right ventricular lead.   Placed on furosemide  for diuresis.   08/12 volume status improving with diuresis, not yet back to baseline, using more than 5 L/min per [place] supplemental 02   08/11 improving volume status, not yet back to baseline.

## 2023-08-18 NOTE — Plan of Care (Signed)
  Problem: Education: Goal: Knowledge of General Education information will improve Description: Including pain rating scale, medication(s)/side effects and non-pharmacologic comfort measures Outcome: Progressing   Problem: Clinical Measurements: Goal: Will remain free from infection Outcome: Progressing   Problem: Elimination: Goal: Will not experience complications related to bowel motility Outcome: Progressing Goal: Will not experience complications related to urinary retention Outcome: Progressing   Problem: Clinical Measurements: Goal: Respiratory complications will improve Outcome: Not Progressing   Problem: Activity: Goal: Risk for activity intolerance will decrease Outcome: Not Progressing

## 2023-08-18 NOTE — Assessment & Plan Note (Signed)
 Stable renal function with serum cr at 1,85 with K at 3,9 and serum bicarbonate at 34  Na 138  Mg 1,5   Add 4 g Mag sulfate IV Continue diuresis and follow up renal function and electrolytes in am.

## 2023-08-18 NOTE — Assessment & Plan Note (Signed)
 Continue pantoprazole.

## 2023-08-18 NOTE — Assessment & Plan Note (Signed)
 Continue statin.

## 2023-08-18 NOTE — Progress Notes (Signed)
 Progress Note   Patient: Felicia Benson FMW:[id number] DOB: 1930/02/08 DOA: 08/17/2023     1 DOS: the patient was seen and examined on 08/18/2023   Brief hospital course: Felicia Benson was admitted to the hospital with the working diagnosis of heart failure exacerbation.   89 yo female, nursing home resident, with the past medical history of Crohn's disease, COPD, CKD stage 3B, history of DVT and PE sp IVC filter, who presented with dyspnea, lower extremity edema and weight gain. Progressive worsening symptoms over several days, with 3 lbs weight gain in 3 days. EMS was called and patient was brought to the hospital.  On his initial physical examination his blood pressure was 108/77, HR 91, RR 14 and 02 saturation 92%  Lungs with no wheezing or rhonchi, heart with S1 and S2 present and regular, with no murmurs or gallops, abdomen with no distention and positive bilateral lower extremity edema.   Na 138, K 3,7 Cl 96 bicarbonate 31 glucose 109, bun 51 cr 2,0  AST 24 and ALT 21  BNP 2,202  High sensitive troponin 47 and 49  Wbc 7,5 hgb 10,6 plt 257   Chest radiograph with hypoinflation, left rotation, cardiomegaly, bilateral hilar vascular congestion, bilateral basal interstitial infiltrates, right basal atelectasis, with small left pleural effusion. Pacemaker in place with one right artrial and on right ventricular lead.    Assessment and Plan: * Acute on chronic diastolic heart failure (HCC) 07/2023 echocardiogram with mild reduction in LV systolic function 45 to 50%, global hypokinesis, severe asymmetric left ventricular hypertrophy of the septal segment, normal size left and right atriums, no pericardial effusion,  interventricular septum is flattened in systole and diastole, mild reduction RV systolic function, moderate to severe mitral valve regurgitation, moderate tricuspid valve regurgitation, mild aortic regurgitation.   Acute on chronic core pulmonale Pulmonary hypertension  RV failure.    Continue volume overloaded Systolic blood pressure 100 mmHg range   Plan to increase furosemide  to 60 mg IV bid  Resume midodrine  5 mg tid to allow better diuresis.  Limited medical therapy due to reduced GFR and risk of hypotension.   Essential hypertension Continue blood pressure monitoring Will add midodrine  for blood pressure support.   CAD S/P percutaneous coronary angioplasty No acute coronary syndrome High sensitive troponin elevation due to heart failure.  Continue aspirin  and statin   Chronic kidney disease, stage 3b (HCC) Renal function with serum cr at 1,84 with K at 3,7 and serum bicarbonate at 32  Na 138   Plan to continue diuresis with IV furosemide  Follow up renal function and electrolytes Avoid hypotension or nephrotoxic medications   COPD (chronic obstructive pulmonary disease) (HCC) No signs of acute exacerbation, continue with bronchodilator therapy   Hyperlipidemia Continue statin   GERD (gastroesophageal reflux disease) Continue pantoprazole    Crohn's disease (HCC) No signs of active flare.  Continue with mesalamine   Follow up as outpatient  OSA (obstructive sleep apnea) cpap  Anemia of chronic disease Hgb stable at 9,8  Follow up cell count.   History of pulmonary embolism SP IV filter  Obesity, class 1 Calculated BMI is 32.4         Subjective: Patient continue to have edema and dyspnea, poor mobility and generalized weakness, no chest or abdominal pain   Physical Exam: Vitals:   08/18/23 0613 08/18/23 0616 08/18/23 0618 08/18/23 0727  BP:    105/70  Pulse: 90   96  Resp: 12 13 18 19   Temp:  97.7 F (36.5 C)  TempSrc:    Oral  SpO2: 96%   91%  Weight:   91.3 kg   Height:       Neurology awake and alert, deconditioned and ill looking appearing  ENT with mild pallor Cardiovascular with S1` and S2 present and regular, with positive systolic murmur at the apex Respiratory with rales at bases, poor inspiratory  effort Abdomen with no distention  Positive lower extremity edema ++   Data Reviewed:    Family Communication: no family at the bedside   Disposition: Status is: Inpatient Remains inpatient appropriate because: IV diuresis   Planned Discharge Destination: Skilled nursing facility     Author: Elidia Toribio Furnace, MD 08/18/2023 10:51 AM  For on call review [email or web address].

## 2023-08-18 NOTE — Assessment & Plan Note (Signed)
 cpap

## 2023-08-19 DIAGNOSIS — I5033 Acute on chronic diastolic (congestive) heart failure: Secondary | ICD-10-CM | POA: Diagnosis not present

## 2023-08-19 DIAGNOSIS — I10 Essential (primary) hypertension: Secondary | ICD-10-CM | POA: Diagnosis not present

## 2023-08-19 DIAGNOSIS — N1832 Chronic kidney disease, stage 3b: Secondary | ICD-10-CM | POA: Diagnosis not present

## 2023-08-19 DIAGNOSIS — I2510 Atherosclerotic heart disease of native coronary artery without angina pectoris: Secondary | ICD-10-CM | POA: Diagnosis not present

## 2023-08-19 LAB — BASIC METABOLIC PANEL WITH GFR
Anion gap: 9 (ref 5–15)
BUN: 48 mg/dL — ABNORMAL HIGH (ref 8–23)
CO2: 34 mmol/L — ABNORMAL HIGH (ref 22–32)
Calcium: 8 mg/dL — ABNORMAL LOW (ref 8.9–10.3)
Chloride: 95 mmol/L — ABNORMAL LOW (ref 98–111)
Creatinine, Ser: 1.85 mg/dL — ABNORMAL HIGH (ref 0.44–1.00)
GFR, Estimated: 25 mL/min — ABNORMAL LOW (ref >60)
Glucose, Bld: 113 mg/dL — ABNORMAL HIGH (ref 70–99)
Potassium: 3.9 mmol/L (ref 3.5–5.1)
Sodium: 138 mmol/L (ref 135–145)

## 2023-08-19 LAB — MAGNESIUM: Magnesium: 1.5 mg/dL — ABNORMAL LOW (ref 1.7–2.4)

## 2023-08-19 MED ORDER — GUAIFENESIN-DM 100-10 MG/5ML PO SYRP
5.0000 mL | ORAL_SOLUTION | ORAL | Status: DC | PRN
  Administered 2023-08-20 – 2023-08-22 (×5): 5 mL via ORAL
  Filled 2023-08-19 (×4): qty 5

## 2023-08-19 MED ORDER — POLYETHYLENE GLYCOL 3350 17 G PO PACK
17.0000 g | PACK | Freq: Once | ORAL | Status: AC
  Administered 2023-08-20 (×2): 17 g via ORAL
  Filled 2023-08-19: qty 1

## 2023-08-19 MED ORDER — MAGNESIUM SULFATE 4 GM/100ML IV SOLN
4.0000 g | Freq: Once | INTRAVENOUS | Status: AC
  Administered 2023-08-19 (×2): 4 g via INTRAVENOUS
  Filled 2023-08-19: qty 100

## 2023-08-19 MED ORDER — GUAIFENESIN 100 MG/5ML PO LIQD
5.0000 mL | Freq: Once | ORAL | Status: AC
  Administered 2023-08-19 (×2): 5 mL via ORAL
  Filled 2023-08-19: qty 10

## 2023-08-19 NOTE — Progress Notes (Signed)
 Heart Failure Navigator Progress Note  Assessed for Heart & Vascular TOC clinic readiness.  Patient does not meet criteria due to per Dr. Noralee No HF TOC to follow with [facility] . S[facility]   Navigator will sign off at this time.   [name] Haddock, BSN, Scientist, clinical (histocompatibility and immunogenetics) Only

## 2023-08-19 NOTE — Evaluation (Signed)
 Occupational Therapy Evaluation Patient Details Name: Felicia Benson MRN: [id number] DOB: Jul 20, 1930 Today's Date: 08/19/2023   History of Present Illness   Pt is a 89 y/o female admitted for acute CHF exacerbation. PMHx: AAA, CHF, Crohn's Disease, GERD, GIB, HTN, OSA, PPM, DVT with IVC filter,  Rt femur fx s/p IM nail     Clinical Impressions PTA, pt from [facility] SNF where she has been actively working with PT there. Pt reports typically requiring limited assistance for ADLs, transfers and mobility using a RW. Pt reports wearing 5-6 L O2 for approximately 1 year though noted in chart, listed as 4 L O2 at baseline. Pt presents now with deficits in cardiopulmonary tolerance, dynamic standing balance, strength and edema. Overall,pt requires Min A for bed mobility, brief standing with RW before reporting fatigue and opting to return to bed. Pt requires Min A for UB ADLs and Mod-Max A for LB ADLs. Difficult to obtain accurate SpO2 reading d/t cold fingertips. Recommend return back to SNF upon DC.     If plan is discharge home, recommend the following:   A little help with walking and/or transfers;A little help with bathing/dressing/bathroom     Functional Status Assessment   Patient has had a recent decline in their functional status and demonstrates the ability to make significant improvements in function in a reasonable and predictable amount of time.     Equipment Recommendations   None recommended by OT     Recommendations for Other Services         Precautions/Restrictions   Precautions Precautions: Fall Recall of Precautions/Restrictions: Impaired Precaution/Restrictions Comments: pt reports wearing 5-6 L O2 at baseline, low vision at baseline Restrictions Weight Bearing Restrictions Per Provider Order: No     Mobility Bed Mobility Overal bed mobility: Needs Assistance Bed Mobility: Supine to Sit, Sit to Supine     Supine to sit: Min assist Sit to  supine: Min assist   General bed mobility comments: Min A to bring LE to EOB and back into bed    Transfers Overall transfer level: Needs assistance Equipment used: Rolling walker (2 wheels) Transfers: Sit to/from Stand Sit to Stand: Min assist           General transfer comment: Min A to stand from bedside , increased time/assist to gain balance. able to take forward and backward steps with CGA though shakiness noted      Balance Overall balance assessment: Needs assistance Sitting-balance support: Feet supported, No upper extremity supported Sitting balance-Leahy Scale: Fair     Standing balance support: Bilateral upper extremity supported, During functional activity Standing balance-Leahy Scale: Poor                             ADL either performed or assessed with clinical judgement   ADL Overall ADL's : Needs assistance/impaired Eating/Feeding: Set up;Sitting   Grooming: Set up;Sitting   Upper Body Bathing: Minimal assistance;Sitting   Lower Body Bathing: Moderate assistance;Sitting/lateral leans;Sit to/from stand   Upper Body Dressing : Minimal assistance;Sitting   Lower Body Dressing: Moderate assistance;Sitting/lateral leans;Sit to/from stand       Toileting- Architect and Hygiene: Maximal assistance;Moderate assistance;Sitting/lateral lean;Sit to/from stand               Vision Baseline Vision/History: 2 Legally blind Ability to See in Adequate Light: 3 Highly impaired Patient Visual Report: No change from baseline Vision Assessment?: No apparent visual deficits  Perception         Praxis         Pertinent Vitals/Pain Pain Assessment Pain Assessment: No/denies pain     Extremity/Trunk Assessment Upper Extremity Assessment Upper Extremity Assessment: Generalized weakness;Right hand dominant;LUE deficits/detail LUE Deficits / Details: increased edema than RUE; pt reports L side usually swells more   Lower  Extremity Assessment Lower Extremity Assessment: Defer to PT evaluation   Cervical / Trunk Assessment Cervical / Trunk Assessment: Kyphotic   Communication Communication Communication: Impaired Factors Affecting Communication: Hearing impaired   Cognition Arousal: Alert Behavior During Therapy: WFL for tasks assessed/performed Cognition: No apparent impairments                               Following commands: Intact       Cueing  General Comments   Cueing Techniques: Verbal cues  Variable O2 readings. on 5 L O2, trial on 4 L O2 but signal poor w/ pt noted to have very cold fingers. applied heat pack to finger with probe though not effective. discussed placing probe on ear- RN aware   Exercises     Shoulder Instructions      Home Living Family/patient expects to be discharged to:: Skilled nursing facility                                 Additional Comments: Pt from [facility]. She is a retired Engineer, civil (consulting).      Prior Functioning/Environment Prior Level of Function : Needs assist;History of Falls (last six months)             Mobility Comments: Pt reports she has been ambulating ~4ft using RW with a w/c follow during her PT sessions. She is modI for bed mobility and  transfers using RW to w/c. Hx of falls ADLs Comments: Pt reports able to bathe self once setup, has assist for toilet transfers/toileting. able to feed self, plays bingo    OT Problem List: Decreased strength;Decreased activity tolerance;Impaired balance (sitting and/or standing);Increased edema;Cardiopulmonary status limiting activity;Impaired vision/perception   OT Treatment/Interventions: Self-care/ADL training;Therapeutic exercise;Energy conservation;DME and/or AE instruction;Therapeutic activities;Balance training;Patient/family education      OT Goals(Current goals can be found in the care plan section)   Acute Rehab OT Goals Patient Stated Goal: continue working  with PT at SNF; get this fluid off of me OT Goal Formulation: With patient Time For Goal Achievement: 09/02/23 Potential to Achieve Goals: Good ADL Goals Pt Will Perform Lower Body Bathing: with min assist;sit to/from stand Pt Will Transfer to Toilet: with contact guard assist;ambulating Pt Will Perform Toileting - Clothing Manipulation and hygiene: with min assist;sit to/from stand;sitting/lateral leans Pt/caregiver will Perform Home Exercise Program: Increased strength;Both right and left upper extremity;With theraband;With Supervision   OT Frequency:  Min 2X/week    Co-evaluation              AM-PAC OT 6 Clicks Daily Activity     Outcome Measure Help from another person eating meals?: A Little Help from another person taking care of personal grooming?: A Little Help from another person toileting, which includes using toliet, bedpan, or urinal?: A Lot Help from another person bathing (including washing, rinsing, drying)?: A Lot Help from another person to put on and taking off regular upper body clothing?: A Little Help from another person to put on and taking off regular lower  body clothing?: A Lot 6 Click Score: 15   End of Session Equipment Utilized During Treatment: Gait belt;Rolling walker (2 wheels);Oxygen Nurse Communication: Mobility status  Activity Tolerance: Patient limited by fatigue Patient left: in bed;with call bell/phone within reach;with bed alarm set  OT Visit Diagnosis: Unsteadiness on feet (R26.81);Other abnormalities of gait and mobility (R26.89);Muscle weakness (generalized) (M62.81)                Time: 8798-8779 OT Time Calculation (min): 19 min Charges:  OT General Charges $OT Visit: 1 Visit OT Evaluation $OT Eval Low Complexity: 1 Low  Mliss NOVAK, OTR/L Acute Rehab Services Office: [phone]   Mliss Fish 08/19/2023, 12:49 PM

## 2023-08-19 NOTE — Progress Notes (Signed)
 Progress Note   Patient: Felicia Benson FMW:[id number] DOB: 02/24/1930 DOA: 08/17/2023     2 DOS: the patient was seen and examined on 08/19/2023   Brief hospital course: Mrs. Kage was admitted to the hospital with the working diagnosis of heart failure exacerbation.   89 yo female, nursing home resident, with the past medical history of Crohn's disease, COPD, CKD stage 3B, history of DVT and PE sp IVC filter, who presented with dyspnea, lower extremity edema and weight gain. Progressive worsening symptoms over several days, with 3 lbs weight gain in 3 days. EMS was called and patient was brought to the hospital.  On his initial physical examination his blood pressure was 108/77, HR 91, RR 14 and 02 saturation 92%  Lungs with no wheezing or rhonchi, heart with S1 and S2 present and regular, with no murmurs or gallops, abdomen with no distention and positive bilateral lower extremity edema.   Na 138, K 3,7 Cl 96 bicarbonate 31 glucose 109, bun 51 cr 2,0  AST 24 and ALT 21  BNP 2,202  High sensitive troponin 47 and 49  Wbc 7,5 hgb 10,6 plt 257   Chest radiograph with hypoinflation, left rotation, cardiomegaly, bilateral hilar vascular congestion, bilateral basal interstitial infiltrates, right basal atelectasis, with small left pleural effusion. Pacemaker in place with one right artrial and on right ventricular lead.   Placed on furosemide  for diuresis.   08/11 improving volume status, not yet back to baseline.   Assessment and Plan: * Acute on chronic diastolic heart failure (HCC) 07/2023 echocardiogram with mild reduction in LV systolic function 45 to 50%, global hypokinesis, severe asymmetric left ventricular hypertrophy of the septal segment, normal size left and right atriums, no pericardial effusion,  interventricular septum is flattened in systole and diastole, mild reduction RV systolic function, moderate to severe mitral valve regurgitation, moderate tricuspid valve regurgitation,  mild aortic regurgitation.   Acute on chronic core pulmonale Pulmonary hypertension  RV failure.   Clinically improved volume status but not yet back to baseline. Systolic blood pressure 100 mmHg range   Continue with furosemide  to 60 mg IV bid  Midodrine  5 mg tid to allow better diuresis.  Limited medical therapy due to reduced GFR and risk of hypotension.   Essential hypertension Continue blood pressure monitoring Midodrine  for blood pressure support.   CAD S/P percutaneous coronary angioplasty No acute coronary syndrome High sensitive troponin elevation due to heart failure.  Continue aspirin  and statin   Chronic kidney disease, stage 3b (HCC) Stable renal function with serum cr at 1,85 with K at 3,9 and serum bicarbonate at 34  Na 138  Mg 1,5   Add 4 g Mag sulfate IV Continue diuresis and follow up renal function and electrolytes in am.   COPD (chronic obstructive pulmonary disease) (HCC) No signs of acute exacerbation, continue with bronchodilator therapy   Hyperlipidemia Continue statin   GERD (gastroesophageal reflux disease) Continue pantoprazole    Crohn's disease (HCC) No signs of active flare.  Continue with mesalamine   Follow up as outpatient  OSA (obstructive sleep apnea) cpap  Anemia of chronic disease Hgb stable at 9,8  Follow up cell count.   History of pulmonary embolism SP IVC filter  Obesity, class 1 Calculated BMI is 32.4       Subjective: Patient is feeling better, dyspnea and edema are improving, but not yet back to baseline.   Physical Exam: Vitals:   08/18/23 2329 08/19/23 0416 08/19/23 0500 08/19/23 0715  BP: 113/69 111/74  122/72  Pulse: 94 92 84   Resp: 17 20 15    Temp: 98.4 F (36.9 C) 98.3 F (36.8 C)  98.8 F (37.1 C)  TempSrc: Oral   Oral  SpO2: 91% 92% 93%   Weight:   94.7 kg   Height:       Neurology awake and alert. Deconditioned ENT with mild pallor Cardiovascular with S1 and S2 present and regular with  no gallops or rubs, positive systolic murmur at the right lower sternal border No JVD Respiratory with mild rales at bases with no wheezing or rhonchi on anterior auscultation  Abdomen with no distention  Positive lower extremity edema ++ pitting  Data Reviewed:    Family Communication: no family at the bedside   Disposition: Status is: Inpatient Remains inpatient appropriate because: IV diuresis   Planned Discharge Destination: Home    Author: Elidia Toribio Furnace, MD 08/19/2023 10:28 AM  For on call review [email or web address].

## 2023-08-19 NOTE — TOC Initial Note (Addendum)
 Transition of Care [facility]) - Initial/Assessment Note    Patient Details  Name: Felicia Benson MRN: [id number] Date of Birth: [birth year]  Transition of Care [facility]) CM/SW Contact:    Lauraine FORBES Saa, LCSWA Phone Number: 08/19/2023, 10:19 AM  Clinical Narrative:                  10:19 AM Per chart review, patient is from [facility] L[facility]. SNF admissions confirmed patient is LTC at [facility] and is able to return upon discharge. Per chart review, patient has a PCP and insurance. Patient's preferred pharmacy's are [facility] and [facility] [phone] [place]. CSW will continue to follow and be available to assist.  11:49 AM CSW followed up with SNF on oxygen requirements. SNF informed CSW that patient's oxygen baseline is 4L and patient will need to be weaned from 5L to 4L oxygen prior to discharge. CSW relayed information to medical team.  Expected Discharge Plan: Long Term Nursing Home Barriers to Discharge: Continued Medical Work up   Patient Goals and CMS Choice            Expected Discharge Plan and Services In-house Referral: Clinical Social Work   Post Acute Care Choice: Skilled Nursing Facility, Nursing Home Living arrangements for the past 2 months: Skilled Nursing Facility                                      Prior Living Arrangements/Services Living arrangements for the past 2 months: Skilled Nursing Facility Lives with:: Facility Resident Patient language and need for interpreter reviewed:: Yes Do you feel safe going back to the place where you live?: Yes        Care giver support system in place?: Yes (comment)   Criminal Activity/Legal Involvement Pertinent to Current Situation/Hospitalization: No - Comment as needed  Activities of Daily Living   ADL Screening (condition at time of admission) Independently performs ADLs?: No Does the patient have a NEW difficulty with bathing/dressing/toileting/self-feeding that is  expected to last >3 days?: No Does the patient have a NEW difficulty with getting in/out of bed, walking, or climbing stairs that is expected to last >3 days?: No Does the patient have a NEW difficulty with communication that is expected to last >3 days?: No Is the patient deaf or have difficulty hearing?: No Does the patient have difficulty seeing, even when wearing glasses/contacts?: No Does the patient have difficulty concentrating, remembering, or making decisions?: No  Permission Sought/Granted Permission sought to share information with : Facility Medical sales representative, Family Supports Permission granted to share information with : No (Contact information on chart)  Share Information with NAME: [name]  Permission granted to share info w AGENCY: [facility]  Permission granted to share info w Relationship: Daughter  Permission granted to share info w Contact Information: [phone]  Emotional Assessment         Alcohol  / Substance Use: Not Applicable Psych Involvement: No (comment)  Admission diagnosis:  Acute on chronic congestive heart failure, unspecified heart failure type (HCC) [I50.9] 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, 41-49%) (HCC) [I50.23] Patient Active Problem List   Diagnosis Date Noted   History of pulmonary embolism 08/18/2023   Obesity, class 1 08/18/2023   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, 41-49%) (HCC) 08/17/2023   Hypoxic episode 07/23/2023   Acute on  chronic respiratory failure with hypoxemia (HCC) 06/25/2023   Myocardial injury 04/08/2023   Sacral ulcer (HCC) 04/08/2023   Hypokalemia 04/08/2023   Goals of care, counseling/discussion 04/08/2023   Acute renal failure superimposed on stage 3b chronic kidney disease (HCC) 03/13/2023   Chronic respiratory failure with hypoxia (HCC) 03/08/2023   Elevated troponin 03/08/2023   Acute exacerbation of CHF  (congestive heart failure) (HCC) 03/07/2023   Right ventricular failure (HCC) 02/15/2023   Shortness of breath 02/13/2023   DVT (deep venous thrombosis) (HCC) 02/11/2023   Acute pulmonary embolism (HCC) 02/08/2023   Acute respiratory failure with hypoxia (HCC) 02/08/2023   CAP (community acquired pneumonia) 02/07/2023   Filling defect on imaging study 02/07/2023   CKD (chronic kidney disease) 02/07/2023   Diverticular hemorrhage 12/28/2022   Ileus, postoperative (HCC) 11/04/2022   Closed right hip fracture (HCC) 10/28/2022   Fall at home, initial encounter 10/28/2022   History of COPD 10/28/2022   Allergic rhinitis 10/28/2022   Anemia of chronic disease 10/28/2022   Chronic rhinitis 08/06/2022   OSA (obstructive sleep apnea) 08/06/2022   Heart failure, acute diastolic (HCC) 07/21/2022   Chronic kidney disease, stage 3b (HCC) 07/21/2022   Thoracic aortic aneurysm (HCC) 07/21/2022   GERD (gastroesophageal reflux disease) 07/21/2022   Pancreatic lesion 07/21/2022   Macular degeneration 07/21/2022   Hypocalcemia 07/21/2022   Insomnia 07/21/2022   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   TIA (transient ischemic attack) 12/31/2020   Acute on chronic diastolic heart failure (HCC) 05/19/2019   Acute CHF (congestive heart failure) (HCC) 05/18/2019   Chronic hypoxic respiratory failure (HCC) 05/18/2019   COPD (chronic obstructive pulmonary disease) (HCC) 05/18/2019   History of cardiac pacemaker 05/18/2019   AAA (abdominal aortic aneurysm) 05/18/2019   Hyperlipidemia 05/18/2019   Abdominal pain in female 05/30/2015   Essential hypertension 05/30/2015   Crohn's disease (HCC) 05/30/2015   Gastrointestinal hemorrhage with melena 05/30/2015   Blood loss anemia 05/30/2015   Hyponatremia 05/30/2015   Kidney disease 05/30/2015   Normocytic anemia 05/30/2015   CAD S/P percutaneous coronary angioplasty 05/30/2015   Acute blood loss anemia 05/30/2015   Heme + stool    PCP:   [facility]:   [facility] [address], [place] - [address] [place] [address] Phone: [phone] Fax: [phone]  [facility] - [place], [place] - [address] [place] [address] Phone: [phone] Fax: [phone]     Social Drivers of Health (SDOH) Social History: SDOH Screenings   Food Insecurity: No Food Insecurity (08/17/2023)  Housing: Low Risk  (08/17/2023)  Transportation Needs: No Transportation Needs (08/17/2023)  Utilities: Not At Risk (08/17/2023)  Social Connections: Moderately Integrated (08/17/2023)  Recent Concern: Social Connections - Moderately Isolated (07/24/2023)  Tobacco Use: Medium Risk (08/17/2023)   SDOH Interventions:     Readmission Risk Interventions    07/26/2023   11:43 AM 07/24/2022    2:49 PM  Readmission Risk Prevention Plan  Transportation Screening Complete Complete  PCP or Specialist Appt within 5-7 Days  Complete  Home Care Screening  Complete  Medication Review (RN CM)  Complete  Medication Review (RN Care Manager) Complete   PCP or Specialist appointment within 3-5 days of discharge Complete   HRI or Home Care Consult Complete   Palliative Care Screening Not Applicable   Skilled Nursing Facility Not Applicable

## 2023-08-20 ENCOUNTER — Ambulatory Visit: Payer: Self-pay | Admitting: Nurse Practitioner

## 2023-08-20 DIAGNOSIS — E66811 Obesity, class 1: Secondary | ICD-10-CM

## 2023-08-20 DIAGNOSIS — I10 Essential (primary) hypertension: Secondary | ICD-10-CM | POA: Diagnosis not present

## 2023-08-20 DIAGNOSIS — I5033 Acute on chronic diastolic (congestive) heart failure: Secondary | ICD-10-CM | POA: Diagnosis not present

## 2023-08-20 DIAGNOSIS — N1832 Chronic kidney disease, stage 3b: Secondary | ICD-10-CM | POA: Diagnosis not present

## 2023-08-20 DIAGNOSIS — I2510 Atherosclerotic heart disease of native coronary artery without angina pectoris: Secondary | ICD-10-CM | POA: Diagnosis not present

## 2023-08-20 DIAGNOSIS — Z86711 Personal history of pulmonary embolism: Secondary | ICD-10-CM

## 2023-08-20 LAB — BASIC METABOLIC PANEL WITH GFR
Anion gap: 8 (ref 5–15)
BUN: 41 mg/dL — ABNORMAL HIGH (ref 8–23)
CO2: 33 mmol/L — ABNORMAL HIGH (ref 22–32)
Calcium: 8.1 mg/dL — ABNORMAL LOW (ref 8.9–10.3)
Chloride: 95 mmol/L — ABNORMAL LOW (ref 98–111)
Creatinine, Ser: 1.4 mg/dL — ABNORMAL HIGH (ref 0.44–1.00)
GFR, Estimated: 36 mL/min — ABNORMAL LOW (ref >60)
Glucose, Bld: 103 mg/dL — ABNORMAL HIGH (ref 70–99)
Potassium: 3.9 mmol/L (ref 3.5–5.1)
Sodium: 136 mmol/L (ref 135–145)

## 2023-08-20 LAB — MAGNESIUM: Magnesium: 2.1 mg/dL (ref 1.7–2.4)

## 2023-08-20 NOTE — Plan of Care (Signed)

## 2023-08-20 NOTE — Progress Notes (Signed)
 Progress Note   Patient: Felicia Benson FMW:[id number] DOB: 23-Apr-1930 DOA: 08/17/2023     3 DOS: the patient was seen and examined on 08/20/2023   Brief hospital course: Felicia Benson was admitted to the hospital with the working diagnosis of heart failure exacerbation.   89 yo female, nursing home resident, with the past medical history of Crohn's disease, COPD, CKD stage 3B, history of DVT and PE sp IVC filter, who presented with dyspnea, lower extremity edema and weight gain. Progressive worsening symptoms over several days, with 3 lbs weight gain in 3 days. EMS was called and patient was brought to the hospital.  On his initial physical examination his blood pressure was 108/77, HR 91, RR 14 and 02 saturation 92%  Lungs with no wheezing or rhonchi, heart with S1 and S2 present and regular, with no murmurs or gallops, abdomen with no distention and positive bilateral lower extremity edema.   Na 138, K 3,7 Cl 96 bicarbonate 31 glucose 109, bun 51 cr 2,0  AST 24 and ALT 21  BNP 2,202  High sensitive troponin 47 and 49  Wbc 7,5 hgb 10,6 plt 257   Chest radiograph with hypoinflation, left rotation, cardiomegaly, bilateral hilar vascular congestion, bilateral basal interstitial infiltrates, right basal atelectasis, with small left pleural effusion. Pacemaker in place with one right artrial and on right ventricular lead.   Placed on furosemide  for diuresis.   08/12 volume status improving with diuresis, not yet back to baseline, using more than 5 L/min per [place] supplemental 02   08/11 improving volume status, not yet back to baseline.   Assessment and Plan: * Acute on chronic diastolic heart failure (HCC) 07/2023 echocardiogram with mild reduction in LV systolic function 45 to 50%, global hypokinesis, severe asymmetric left ventricular hypertrophy of the septal segment, normal size left and right atriums, no pericardial effusion,  interventricular septum is flattened in systole and diastole, mild  reduction RV systolic function, moderate to severe mitral valve regurgitation, moderate tricuspid valve regurgitation, mild aortic regurgitation.   Acute on chronic core pulmonale Pulmonary hypertension  RV failure.   Urine output 1750 ml.  Systolic blood pressure 100 mmHg range  Clinically edema is improving.   Continue with furosemide  to 60 mg IV bid  Continue with midodrine  5 mg tid to allow better diuresis.  Limited medical therapy due to reduced GFR and risk of hypotension.   Acute on chronic hypoxemic respiratory failure, due to acute cardiogenic pulmonary edema. Continue diuresis, supplemental 02 per [place] to keep 02 saturation greater than 88%  Essential hypertension Continue blood pressure monitoring Midodrine  for blood pressure support.   CAD S/P percutaneous coronary angioplasty No acute coronary syndrome High sensitive troponin elevation due to heart failure.  Continue aspirin  and statin   Chronic kidney disease, stage 3b (HCC) AKI, hypomagnesemia.   Volume status is improving, renal function today with serum cr at 1,40 with K at 3,9 and serum bicarbonate at 33 Na 136 and Mg 2.1   Continue diuresis with furosemide , follow up renal function and electrolytes in am.  Avoid hypotension and nephrotoxic medications   COPD (chronic obstructive pulmonary disease) (HCC) No signs of acute exacerbation, continue with bronchodilator therapy   Hyperlipidemia Continue statin   GERD (gastroesophageal reflux disease) Continue pantoprazole    Crohn's disease (HCC) No signs of active flare.  Continue with mesalamine   Follow up as outpatient  OSA (obstructive sleep apnea) cpap  Anemia of chronic disease Hgb stable at 9,8  Follow up cell count.  History of pulmonary embolism SP IVC filter  Obesity, class 1 Calculated BMI is 32.4      Subjective: Patient is feeling better, dyspnea and edema have improved, positive cough. No chest pain   Physical Exam: Vitals:    08/19/23 1929 08/19/23 2317 08/20/23 0413 08/20/23 0716  BP: 101/64 108/68 110/68 122/82  Pulse: 89 91 89 99  Resp: 16 15 16 14   Temp: 97.8 F (36.6 C) 97.7 F (36.5 C) 97.6 F (36.4 C) 97.8 F (36.6 C)  TempSrc: Oral Oral Oral Oral  SpO2: 94% 93% 94% 90%  Weight:   90.8 kg   Height:       Neurology awake and alert, deconditioned ENT with mild pallor with no icterus Cardiovascular with S1 and S2 present, irregular, with positive systolic murmur at the right lower sternal border, systolic murmur at the apex.  No JVD Respiratory with poor inspiratory effort, mild rales at the dependent zones, with no wheezing, anterior auscultation  Abdomen protuberant but not distended or tender Positive lower extremity edema, pitting ++  Data Reviewed:    Family Communication: no family at the beside   Disposition: Status is: Inpatient Remains inpatient appropriate because: IV diuresis   Planned Discharge Destination: Skilled nursing facility    Author: Elidia Toribio Furnace, MD 08/20/2023 10:16 AM  For on call review [email or web address].

## 2023-08-20 NOTE — Care Management Important Message (Signed)
 Important Message  Patient Details  Name: Felicia Benson MRN: [id number] Date of Birth: 1930-03-23   Important Message Given:  Yes - Medicare IM     Claretta Deed 08/20/2023, 4:17 PM

## 2023-08-20 NOTE — Progress Notes (Addendum)
 PROGRESS NOTE    Felicia Benson  FMW:[id number] DOB: 06/27/1930 DOA: 08/17/2023 PCP: Leonidas  89 yo female, nursing home resident, with the past medical history of Crohn's disease, COPD, CKD stage 3B, history of DVT and PE sp IVC filter, who presented with dyspnea, lower extremity edema and weight gain. Progressive worsening symptoms over several days, with 3 lbs weight gain in 3 days. EMS was called and patient was brought to the hospital.  On his initial physical examination his blood pressure was 108/77, HR 91, RR 14 and 02 saturation 92% positive bilateral lower extremity edema.  Na 138, K 3,7 Cl 96 bicarbonate 31 glucose 109, bun 51 cr 2,0 , BNP 2,202 , troponin 47 and 49 CXR wcardiomegaly, bilateral hilar vascular congestion, bilateral basal interstitial infiltrates, right basal atelectasis, with small left pleural effusion. Pacemaker in place with one right artrial and on right ventricular lead.  -Placed on furosemide  for diuresis.  -8/12 volume status improving with diuresis, not yet back to baseline, using more than 5 L/min per [place] supplemental 02  -8/11 improving volume status, not yet back to baseline.    Subjective: Feels fair, no events overnight  Assessment and Plan:  Acute on chronic diastolic heart failure (HCC) Pulm HTN, RV Failure Mod to Severe MR 07/2023 echo w/ EF 45 to 50%, global hypokinesis, severe LVH, mild reduction RV systolic function, moderate to severe mitral valve regurgitation, moderate tricuspid valve regurgitation, - Readmitted with 20 pound weight gain following recent hospitalization 3 weeks ago - Increase Lasix  dose to 80 mg IV bid, continue midodrine  Limited medical therapy due to reduced GFR and risk of hypotension.  - Has been seen by palliative care multiple times in the last few admissions, hospice has been suggested on several occasions - Add Unna boots  Acute on chronic hypoxemic respiratory failure, due to acute cardiogenic pulmonary  edema. -She is on 5 L home O2 at baseline  CAD S/P percutaneous coronary angioplasty No acute coronary syndrome High sensitive troponin elevation due to heart failure.  Continue aspirin  and statin   Chronic kidney disease, stage 3b (HCC) AKI, hypomagnesemia.  - Stable, continue diuretics  COPD (chronic obstructive pulmonary disease) (HCC) No signs of acute exacerbation, continue with bronchodilator therapy   Hyperlipidemia Continue statin   GERD (gastroesophageal reflux disease) Continue pantoprazole    Crohn's disease (HCC) No signs of active flare.  Continue with mesalamine   Follow up as outpatient  OSA (obstructive sleep apnea) cpap  Anemia of chronic disease Hgb stable at 9,8  Follow up cell count.   History of pulmonary embolism SP IVC filter  Obesity, class 1 Calculated BMI is 32.4   DVT prophylaxis: Hep SQ Code Status: DNR Family Communication: None present Disposition Plan: Back to SNF likely 2 to 3 days  Consultants:    Procedures:   Antimicrobials:    Objective: Vitals:   08/20/23 0413 08/20/23 0716 08/20/23 1037 08/20/23 1119  BP: 110/68 122/82 106/66   Pulse: 89 99 93   Resp: 16 14 17    Temp: 97.6 F (36.4 C) 97.8 F (36.6 C) 97.8 F (36.6 C) 97.8 F (36.6 C)  TempSrc: Oral Oral Oral Oral  SpO2: 94% 90% (!) 88%   Weight: 90.8 kg     Height:        Intake/Output Summary (Last 24 hours) at 08/20/2023 1412 Last data filed at 08/20/2023 0413 Gross per 24 hour  Intake 100 ml  Output 1750 ml  Net -1650 ml   [employer]  08/18/23 0618 08/19/23 0500 08/20/23 0413  Weight: 91.3 kg 94.7 kg 90.8 kg    Examination:  Gen: Awake, Alert, Oriented X 3,  HEENT: + JVD Lungs: Good air movement bilaterally, CTAB CVS: S1S2/RRR Abd: soft, Non tender, non distended, BS present Extremities: 2+ edema Skin: no new rashes on exposed skin     Data Reviewed:   CBC: Recent Labs  Lab 08/17/23 1953 08/18/23 0230  WBC 7.5 7.2  NEUTROABS  5.4  --   HGB 10.6* 9.8*  HCT 34.9* 31.5*  MCV 98.0 95.5  PLT 257 229   Basic Metabolic Panel: Recent Labs  Lab 08/17/23 1953 08/18/23 0230 08/19/23 0228 08/20/23 0311  NA 138 138 138 136  K 3.7 3.7 3.9 3.9  CL 96* 96* 95* 95*  CO2 31 32 34* 33*  GLUCOSE 109* 98 113* 103*  BUN 51* 48* 48* 41*  CREATININE 2.04* 1.84* 1.85* 1.40*  CALCIUM  8.1* 8.1* 8.0* 8.1*  MG  --  1.6* 1.5* 2.1   GFR: Estimated Creatinine Clearance: 29.7 mL/min (A) (by C-G formula based on SCr of 1.4 mg/dL (H)). Liver Function Tests: Recent Labs  Lab 08/17/23 1953  AST 24  ALT 21  ALKPHOS 82  BILITOT 0.6  PROT 6.3*  ALBUMIN  2.6*   No results for input(s): LIPASE, AMYLASE in the last 168 hours. No results for input(s): AMMONIA in the last 168 hours. Coagulation Profile: No results for input(s): INR, PROTIME in the last 168 hours. Cardiac Enzymes: No results for input(s): CKTOTAL, CKMB, CKMBINDEX, TROPONINI in the last 168 hours. BNP (last 3 results) No results for input(s): PROBNP in the last 8760 hours. HbA1C: No results for input(s): HGBA1C in the last 72 hours. CBG: No results for input(s): GLUCAP in the last 168 hours. Lipid Profile: No results for input(s): CHOL, HDL, LDLCALC, TRIG, CHOLHDL, LDLDIRECT in the last 72 hours. Thyroid Function Tests: No results for input(s): TSH, T4TOTAL, FREET4, T3FREE, THYROIDAB in the last 72 hours. Anemia Panel: No results for input(s): VITAMINB12, FOLATE, FERRITIN, TIBC, IRON, RETICCTPCT in the last 72 hours. Urine analysis:    Component Value Date/Time   COLORURINE YELLOW 12/31/2020 0100   APPEARANCEUR CLEAR 12/31/2020 0100   LABSPEC 1.020 12/31/2020 0100   PHURINE 6.0 12/31/2020 0100   GLUCOSEU NEGATIVE 12/31/2020 0100   HGBUR NEGATIVE 12/31/2020 0100   BILIRUBINUR NEGATIVE 12/31/2020 0100   KETONESUR NEGATIVE 12/31/2020 0100   PROTEINUR NEGATIVE 12/31/2020 0100   NITRITE POSITIVE (A)  12/31/2020 0100   LEUKOCYTESUR NEGATIVE 12/31/2020 0100   Sepsis Labs: @LABRCNTIP (procalcitonin:4,lacticidven:4)  ) Recent Results (from the past 240 hours)  MRSA Next Gen by PCR, Nasal     Status: Abnormal   Collection Time: 08/17/23 10:56 PM   Specimen: Nasal Mucosa; Nasal Swab  Result Value Ref Range Status   MRSA by PCR Next Gen DETECTED (A) NOT DETECTED Final    Comment: RESULT CALLED TO, READ BACK BY AND VERIFIED WITH: C KYEI RN 08/18/23 @ 0038 BY AB (NOTE) The GeneXpert MRSA Assay (FDA approved for NASAL specimens only), is one component of a comprehensive MRSA colonization surveillance program. It is not intended to diagnose MRSA infection nor to guide or monitor treatment for MRSA infections. Test performance is not FDA approved in patients less than 26 years old. Performed at [facility], [address] [place], [place] [address]      Radiology Studies: No results found.   Scheduled Meds:  aspirin  EC  81 mg Oral Daily  atorvastatin   40 mg Oral QHS   Chlorhexidine  Gluconate Cloth  6 each Topical Daily   fluticasone  furoate-vilanterol  1 puff Inhalation Daily   furosemide   60 mg Intravenous Q12H   heparin   5,000 Units Subcutaneous Q8H   mesalamine   1,000 mg Oral BID   midodrine   5 mg Oral BID WC   mupirocin  ointment  1 Application Nasal BID   pantoprazole   40 mg Oral BID   sodium chloride  flush  3 mL Intravenous Q12H   Continuous Infusions:   LOS: 3 days    Time spent:    Sigurd Pac, MD [facility]   08/20/2023, 2:12 PM

## 2023-08-20 NOTE — Evaluation (Signed)
 Physical Therapy Evaluation Patient Details Name: Felicia Benson MRN: [id number] DOB: [birth year] Today's Date: 08/20/2023  History of Present Illness  Pt is a 89 y/o female admitted 8/51for acute CHF exacerbation. PMHx: AAA, CHF, Crohn's Disease, GERD, GIB, HTN, OSA, PPM, DVT with IVC filter,  Rt femur fx s/p IM nail  Clinical Impression  Pt admitted with above diagnosis. Previously working with PT team at [facility], using a RW to ambulate with assist up to 60' per her report. Pt easily fatigued and dyspneic with minimal exertion today. Able to transfer to chair from bed with RW and min assist. SpO2 dropped to 84% on 6L HFNC. Increased supplemental O2 to 7L cues for breathing techniques, returned to low 90s. Back to 6L, sats at 88-90% at rest in recliner. Reviewed LE exercises. Patient will benefit from continued inpatient follow up therapy, <3 hours/day. Pt currently with functional limitations due to the deficits listed below (see PT Problem List). Pt will benefit from acute skilled PT to increase their independence and safety with mobility to allow discharge.           If plan is discharge home, recommend the following: A little help with walking and/or transfers;A lot of help with bathing/dressing/bathroom;Assistance with cooking/housework;Assist for transportation   Can travel by private vehicle   No    Equipment Recommendations None recommended by PT  Recommendations for Other Services       Functional Status Assessment Patient has had a recent decline in their functional status and demonstrates the ability to make significant improvements in function in a reasonable and predictable amount of time.     Precautions / Restrictions Precautions Precautions: Fall Recall of Precautions/Restrictions: Impaired Precaution/Restrictions Comments: pt reports wearing 5-6 L O2 at baseline, low vision at baseline Restrictions Weight Bearing Restrictions Per Provider Order: No      Mobility  Bed  Mobility Overal bed mobility: Needs Assistance Bed Mobility: Supine to Sit     Supine to sit: Min assist     General bed mobility comments: Min assist for trunk support. pad used to assist with scoot to reduce friction.    Transfers Overall transfer level: Needs assistance Equipment used: Rolling walker (2 wheels) Transfers: Sit to/from Stand, Bed to chair/wheelchair/BSC Sit to Stand: Min assist, Via lift equipment Stand pivot transfers: Min assist         General transfer comment: Min assist for boost and balance, bed slightly elevated, cues for hand placement. Leaning posteriorly. RW to steady, cues to lean forward. Min assist for RW control and balance.    Ambulation/Gait               General Gait Details: Deferred, sats drop rapidly, increased WOB with minimal exertion  Stairs            Wheelchair Mobility     Tilt Bed    Modified Rankin (Stroke Patients Only)       Balance Overall balance assessment: Needs assistance Sitting-balance support: Feet supported, No upper extremity supported Sitting balance-Leahy Scale: Fair     Standing balance support: Bilateral upper extremity supported, During functional activity Standing balance-Leahy Scale: Poor                               Pertinent Vitals/Pain Pain Assessment Pain Assessment: No/denies pain    Home Living Family/patient expects to be discharged to:: Skilled nursing facility  Additional Comments: Pt from [facility]. She is a retired Engineer, civil (consulting).    Prior Function Prior Level of Function : Needs assist;History of Falls (last six months)       Physical Assist : Mobility (physical);ADLs (physical) Mobility (physical): Gait ADLs (physical): Bathing;Dressing;Toileting Mobility Comments: Pt reports she has been ambulating ~44ft using RW with a w/c follow during her PT sessions. She is modI for bed mobility and  transfers using RW to w/c. Hx of  falls ADLs Comments: Pt reports able to bathe self once setup, has assist for toilet transfers/toileting. able to feed self, plays bingo     Extremity/Trunk Assessment   Upper Extremity Assessment Upper Extremity Assessment: Defer to OT evaluation    Lower Extremity Assessment Lower Extremity Assessment: Generalized weakness    Cervical / Trunk Assessment Cervical / Trunk Assessment: Kyphotic  Communication   Communication Communication: Impaired Factors Affecting Communication: Hearing impaired    Cognition Arousal: Alert Behavior During Therapy: WFL for tasks assessed/performed   PT - Cognitive impairments: No apparent impairments                         Following commands: Intact       Cueing Cueing Techniques: Verbal cues     General Comments General comments (skin integrity, edema, etc.): SpO2 89% at rest on 6L HFNC. Dropped to 84% with transfer. Increased to 7L while resting, and recovered within 3 minutes. Returned to 6L, SpO2 88-90% end of session.    Exercises General Exercises - Lower Extremity Ankle Circles/Pumps: AROM, Both, 10 reps, Seated Quad Sets: Strengthening, Both, 10 reps, Seated Gluteal Sets: Strengthening, Both, 10 reps, Seated   Assessment/Plan    PT Assessment Patient needs continued PT services  PT Problem List Decreased range of motion;Decreased strength;Decreased activity tolerance;Decreased balance;Decreased mobility;Cardiopulmonary status limiting activity;Obesity       PT Treatment Interventions DME instruction;Gait training;Functional mobility training;Therapeutic activities;Therapeutic exercise;Balance training;Neuromuscular re-education;Cognitive remediation;Patient/family education    PT Goals (Current goals can be found in the Care Plan section)  Acute Rehab PT Goals Patient Stated Goal: Get well return to rehab PT Goal Formulation: With patient Time For Goal Achievement: 09/03/23 Potential to Achieve Goals:  Good    Frequency Min 1X/week     Co-evaluation               AM-PAC PT 6 Clicks Mobility  Outcome Measure Help needed turning from your back to your side while in a flat bed without using bedrails?: A Little Help needed moving from lying on your back to sitting on the side of a flat bed without using bedrails?: A Little Help needed moving to and from a bed to a chair (including a wheelchair)?: A Little Help needed standing up from a chair using your arms (e.g., wheelchair or bedside chair)?: A Little Help needed to walk in hospital room?: A Lot Help needed climbing 3-5 steps with a railing? : Total 6 Click Score: 15    End of Session Equipment Utilized During Treatment: Gait belt;Oxygen Activity Tolerance: Patient limited by fatigue Patient left: in chair;with call bell/phone within reach;with chair alarm set Nurse Communication: Mobility status PT Visit Diagnosis: Other abnormalities of gait and mobility (R26.89);Difficulty in walking, not elsewhere classified (R26.2);Muscle weakness (generalized) (M62.81)    Time: 8889-8867 PT Time Calculation (min) (ACUTE ONLY): 22 min   Charges:   PT Evaluation $PT Eval Low Complexity: 1 Low   PT General Charges $$ ACUTE PT VISIT: 1 Visit  Leontine Roads, PT, DPT [facility]  Rehabilitation Services Physical Therapist Office: [phone] Website: [email or web address]   Leontine GORMAN Roads 08/20/2023, 1:39 PM

## 2023-08-20 NOTE — NC FL2 (Signed)
 [place]  MEDICAID FL2 LEVEL OF CARE FORM     IDENTIFICATION  Patient Name: ILYNN Benson Birthdate: 1930-11-17 Sex: female Admission Date (Current Location): 08/17/2023  [facility] and [place] Number:  Producer, television/film/video and Address:  [facility] [place], 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, 41-49%) (HCC) 08/17/2023   Hypoxic episode 07/23/2023   Acute on chronic respiratory failure with hypoxemia (HCC) 06/25/2023   Myocardial injury 04/08/2023   Sacral ulcer (HCC) 04/08/2023   Hypokalemia 04/08/2023   Goals of care, counseling/discussion 04/08/2023   Acute renal failure superimposed on stage 3b chronic kidney disease (HCC) 03/13/2023   Chronic respiratory failure with hypoxia (HCC) 03/08/2023   Elevated troponin 03/08/2023   Acute exacerbation of CHF (congestive heart failure) (HCC) 03/07/2023   Right ventricular failure (HCC) 02/15/2023   Shortness of breath 02/13/2023   DVT (deep venous thrombosis) (HCC) 02/11/2023   Acute pulmonary embolism (HCC) 02/08/2023   Acute respiratory failure with hypoxia (HCC) 02/08/2023   CAP (community acquired pneumonia) 02/07/2023   Filling defect on imaging study 02/07/2023   CKD (chronic kidney disease) 02/07/2023   Diverticular hemorrhage 12/28/2022   Ileus, postoperative (HCC) 11/04/2022   Closed right hip fracture (HCC) 10/28/2022    Fall at home, initial encounter 10/28/2022   History of COPD 10/28/2022   Allergic rhinitis 10/28/2022   Anemia of chronic disease 10/28/2022   Chronic rhinitis 08/06/2022   OSA (obstructive sleep apnea) 08/06/2022   Heart failure, acute diastolic (HCC) 07/21/2022   Chronic kidney disease, stage 3b (HCC) 07/21/2022   Thoracic aortic aneurysm (HCC) 07/21/2022   GERD (gastroesophageal reflux disease) 07/21/2022   Pancreatic lesion 07/21/2022   Macular degeneration 07/21/2022   Hypocalcemia 07/21/2022   Insomnia 07/21/2022   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   TIA (transient ischemic attack) 12/31/2020   Acute on chronic diastolic heart failure (HCC) 05/19/2019   Acute CHF (congestive heart failure) (HCC) 05/18/2019   Chronic hypoxic respiratory failure (HCC) 05/18/2019   COPD (chronic obstructive pulmonary disease) (HCC) 05/18/2019   History of cardiac pacemaker 05/18/2019   AAA (abdominal aortic aneurysm) 05/18/2019   Hyperlipidemia 05/18/2019   Abdominal pain in female 05/30/2015   Essential hypertension 05/30/2015   Crohn's disease (HCC) 05/30/2015   Gastrointestinal hemorrhage with melena 05/30/2015   Blood loss anemia 05/30/2015   Hyponatremia 05/30/2015   Kidney disease 05/30/2015   Normocytic anemia 05/30/2015   CAD S/P percutaneous coronary angioplasty 05/30/2015   Acute blood loss anemia 05/30/2015   Heme + stool     Orientation RESPIRATION BLADDER Height & Weight     Self, Time, Place, Situation  O2 (HFNC 7L and weaning) Incontinent, External catheter Weight: 200 lb 2.8 oz (90.8 kg) Height:  5' 6 (167.6 cm)  BEHAVIORAL SYMPTOMS/MOOD NEUROLOGICAL BOWEL NUTRITION STATUS      Incontinent Diet (Please see discharge summary)  AMBULATORY STATUS COMMUNICATION OF NEEDS Skin  Extensive Assist Verbally PU Stage and Appropriate Care (Wound 08/17/23 2300 Pressure Injury Coccyx Medial Unstageable - Full thickness tissue loss in which the base of the injury is  covered by slough (yellow, tan, gray, green or brown) and/or eschar (tan, brown or black) in the wound bed.)                       Personal Care Assistance Level of Assistance  Bathing, Feeding, Dressing Bathing Assistance: Maximum assistance Feeding assistance: Maximum assistance Dressing Assistance: Maximum assistance     Functional Limitations Info  Sight, Hearing Sight Info: Impaired (R and L (Blind)) Hearing Info: Impaired (R and L)      SPECIAL CARE FACTORS FREQUENCY  PT (By licensed PT), OT (By licensed OT)     PT Frequency: 5x OT Frequency: 5x            Contractures Contractures Info: Not present    Additional Factors Info  Code Status, Allergies Code Status Info: DNR-LIMITED -Do Not Intubate/DNI Allergies Info: Motrin (ibuprofen); Ace Inhibitors; Breztri  Aerosphere (budeson-glycopyrrol-formoterol ); Cipro (ciprofloxacin Hcl); Levaquin (levofloxacin); Nsaids           Current Medications (08/20/2023):  This is the current hospital active medication list Current Facility-Administered Medications  Medication Dose Route Frequency Provider Last Rate Last Admin   acetaminophen  (TYLENOL ) tablet 650 mg  650 mg Oral Q6H PRN Opyd, Timothy S, MD   650 mg at 08/18/23 9646   Or   acetaminophen  (TYLENOL ) suppository 650 mg  650 mg Rectal Q6H PRN Opyd, Timothy S, MD       aspirin  EC tablet 81 mg  81 mg Oral Daily Opyd, Timothy S, MD   81 mg at 08/20/23 0943   atorvastatin  (LIPITOR) tablet 40 mg  40 mg Oral QHS Opyd, Timothy S, MD   40 mg at 08/19/23 2201   Chlorhexidine  Gluconate Cloth 2 % PADS 6 each  6 each Topical Daily Kumar, Navneet, MD   6 each at 08/20/23 0916   fluticasone  furoate-vilanterol (BREO ELLIPTA ) 200-25 MCG/ACT 1 puff  1 puff Inhalation Daily Opyd, Timothy S, MD   1 puff at 08/20/23 0921   furosemide  (LASIX ) injection 60 mg  60 mg Intravenous Q12H Arrien, Mauricio Daniel, MD   60 mg at 08/20/23 0911   guaiFENesin -dextromethorphan  (ROBITUSSIN DM) 100-10  MG/5ML syrup 5 mL  5 mL Oral Q4H PRN Arrien, Elidia Sieving, MD   5 mL at 08/20/23 0944   heparin  injection 5,000 Units  5,000 Units Subcutaneous Q8H Opyd, Timothy S, MD   5,000 Units at 08/20/23 1310   ipratropium-albuterol  (DUONEB) 0.5-2.5 (3) MG/3ML nebulizer solution 3 mL  3 mL Nebulization Q6H PRN Opyd, Timothy S, MD       melatonin tablet 9 mg  9 mg Oral QHS PRN Opyd, Timothy S, MD   9 mg at 08/18/23 2206   mesalamine  (PENTASA ) CR capsule 1,000 mg  1,000 mg Oral BID Opyd, Timothy S, MD   1,000 mg at 08/20/23 0944   midodrine  (PROAMATINE ) tablet 5 mg  5 mg Oral BID WC Arrien, Mauricio Daniel, MD   5 mg at 08/20/23 0944   mupirocin  ointment (BACTROBAN ) 2 % 1 Application  1 Application Nasal BID Kumar, Navneet, MD   1 Application at 08/20/23 0915   pantoprazole  (PROTONIX ) EC tablet 40 mg  40 mg Oral BID Opyd, Timothy S, MD   40 mg at 08/20/23 0943   prochlorperazine  (COMPAZINE ) injection 5 mg  5 mg Intravenous  Q6H PRN Opyd, Timothy S, MD   5 mg at 08/20/23 9092   senna (SENOKOT) tablet 8.6 mg  1 tablet Oral Daily PRN Opyd, Timothy S, MD       sodium chloride  flush (NS) 0.9 % injection 3 mL  3 mL Intravenous Q12H Opyd, Timothy S, MD   3 mL at 08/20/23 9083     Discharge Medications: Please see discharge summary for a list of discharge medications.  Relevant Imaging Results:  Relevant Lab Results:   Additional Information [id number]  Lauraine FORBES Saa, LCSWA

## 2023-08-20 NOTE — TOC Progression Note (Addendum)
 Transition of Care [facility]) - Progression Note    Patient Details  Name: Felicia Benson MRN: [id number] Date of Birth: 1930-11-06  Transition of Care [facility]) CM/SW Contact  Lauraine FORBES Saa, LCSWA Phone Number: 08/20/2023, 11:50 AM  Clinical Narrative:     11:50 AM CSW informed [facility] SNF admissions that patient has been unable to wean to 4L oxygen and that patient is currently on 6L. SNF admissions confirmed they can admit patient on 5L or 6L pending concentrator. SNF to order concentrator for potential patient use if discharging on 6L. CSW relayed information to medical team. Hospitalist stated patient is expected to discharge tomorrow. CSW informed SNF of patient's expected discharge date.   2:02 PM CSW sent FL2 to [facility]. CSW will continue to follow and be available to assist.  Expected Discharge Plan: Long Term Nursing Home Barriers to Discharge: Continued Medical Work up               Expected Discharge Plan and Services In-house Referral: Clinical Social Work   Post Acute Care Choice: Skilled Nursing Facility, Nursing Home Living arrangements for the past 2 months: Skilled Nursing Facility                                       Social Drivers of Health (SDOH) Interventions SDOH Screenings   Food Insecurity: No Food Insecurity (08/17/2023)  Housing: Low Risk  (08/17/2023)  Transportation Needs: No Transportation Needs (08/17/2023)  Utilities: Not At Risk (08/17/2023)  Social Connections: Moderately Integrated (08/17/2023)  Recent Concern: Social Connections - Moderately Isolated (07/24/2023)  Tobacco Use: Medium Risk (08/17/2023)    Readmission Risk Interventions    07/26/2023   11:43 AM 07/24/2022    2:49 PM  Readmission Risk Prevention Plan  Transportation Screening Complete Complete  PCP or Specialist Appt within 5-7 Days  Complete  Home Care Screening  Complete  Medication Review (RN CM)  Complete  Medication Review (RN Care Manager) Complete    PCP or Specialist appointment within 3-5 days of discharge Complete   HRI or Home Care Consult Complete   Palliative Care Screening Not Applicable   Skilled Nursing Facility Not Applicable

## 2023-08-21 DIAGNOSIS — I5033 Acute on chronic diastolic (congestive) heart failure: Secondary | ICD-10-CM | POA: Diagnosis not present

## 2023-08-21 LAB — BASIC METABOLIC PANEL WITH GFR
Anion gap: 10 (ref 5–15)
BUN: 36 mg/dL — ABNORMAL HIGH (ref 8–23)
CO2: 33 mmol/L — ABNORMAL HIGH (ref 22–32)
Calcium: 8.3 mg/dL — ABNORMAL LOW (ref 8.9–10.3)
Chloride: 95 mmol/L — ABNORMAL LOW (ref 98–111)
Creatinine, Ser: 1.47 mg/dL — ABNORMAL HIGH (ref 0.44–1.00)
GFR, Estimated: 33 mL/min — ABNORMAL LOW (ref >60)
Glucose, Bld: 93 mg/dL (ref 70–99)
Potassium: 3.4 mmol/L — ABNORMAL LOW (ref 3.5–5.1)
Sodium: 138 mmol/L (ref 135–145)

## 2023-08-21 LAB — MAGNESIUM: Magnesium: 2 mg/dL (ref 1.7–2.4)

## 2023-08-21 MED ORDER — FUROSEMIDE 10 MG/ML IJ SOLN
80.0000 mg | Freq: Two times a day (BID) | INTRAMUSCULAR | Status: DC
  Administered 2023-08-21 – 2023-08-24 (×8): 80 mg via INTRAVENOUS
  Filled 2023-08-21 (×7): qty 8

## 2023-08-21 MED ORDER — MEDIHONEY WOUND/BURN DRESSING EX PSTE
1.0000 | PASTE | Freq: Every day | CUTANEOUS | Status: DC
  Administered 2023-08-21 – 2023-08-26 (×7): 1 via TOPICAL
  Filled 2023-08-21 (×2): qty 44

## 2023-08-21 MED ORDER — GERHARDT'S BUTT CREAM
TOPICAL_CREAM | Freq: Two times a day (BID) | CUTANEOUS | Status: DC
  Administered 2023-08-21 (×2): 1 via TOPICAL
  Filled 2023-08-21: qty 60

## 2023-08-21 NOTE — Consult Note (Signed)
 WOC Nurse Consult Note: patient known to WOC team from previous admission with unstageable pressure injury  Reason for Consult: sacral wound  Wound type:Unstageable Pressure Injury  Pressure Injury POA: Yes Measurement: see nursing flowsheet Wound bed: 60% tan fibrin 40% pink  Drainage (amount, consistency, odor) see nursing flowsheet  Periwound: erythema with some moisture associated skin damage  Dressing procedure/placement/frequency: Cleanse sacral wound with NS, apply Medihoney to wound bed daily, fill in wound with dry gauze and cover with silicone foam or ABD pad and tape whichever is preferred.   Will write for Gerhardt's to surrounding intact skin of buttocks 2 times daily and prn soiling.   POC discussed with bedside nurse. WOC team will not follow. Re-consult if further needs arise.   Thank you,    Powell Bar MSN, RN-BC, [employer]

## 2023-08-21 NOTE — TOC Progression Note (Signed)
 Transition of Care [facility]) - Progression Note    Patient Details  Name: Felicia Benson MRN: [id number] Date of Birth: 1930-06-21  Transition of Care [facility]) CM/SW Contact  Lauraine FORBES Saa, LCSWA Phone Number: 08/21/2023, 1:44 PM  Clinical Narrative:     1:44 PM Per chart review, patient is expected to discharge within the next 2-3 days. CSW informed [facility] L[facility] of patient's expected discharge date. CSW will continue to follow and be available to assist.  Expected Discharge Plan: Long Term Nursing Home Barriers to Discharge: Continued Medical Work up               Expected Discharge Plan and Services In-house Referral: Clinical Social Work   Post Acute Care Choice: Skilled Nursing Facility, Nursing Home Living arrangements for the past 2 months: Skilled Nursing Facility                                       Social Drivers of Health (SDOH) Interventions SDOH Screenings   Food Insecurity: No Food Insecurity (08/17/2023)  Housing: Low Risk  (08/17/2023)  Transportation Needs: No Transportation Needs (08/17/2023)  Utilities: Not At Risk (08/17/2023)  Social Connections: Moderately Integrated (08/17/2023)  Recent Concern: Social Connections - Moderately Isolated (07/24/2023)  Tobacco Use: Medium Risk (08/17/2023)    Readmission Risk Interventions    07/26/2023   11:43 AM 07/24/2022    2:49 PM  Readmission Risk Prevention Plan  Transportation Screening Complete Complete  PCP or Specialist Appt within 5-7 Days  Complete  Home Care Screening  Complete  Medication Review (RN CM)  Complete  Medication Review (RN Care Manager) Complete   PCP or Specialist appointment within 3-5 days of discharge Complete   HRI or Home Care Consult Complete   Palliative Care Screening Not Applicable   Skilled Nursing Facility Not Applicable

## 2023-08-21 NOTE — Progress Notes (Signed)
   08/21/23 2316  BiPAP/CPAP/SIPAP  Reason BIPAP/CPAP not in use Other(comment) (Patient states daughter will be bringing hers tomorrow)  BiPAP/CPAP /SiPAP Vitals  Pulse Rate 80  Resp 16  SpO2 90 %  Bilateral Breath Sounds Clear;Diminished  MEWS Score/Color  MEWS Score 0  MEWS Score Color Green

## 2023-08-22 DIAGNOSIS — I5033 Acute on chronic diastolic (congestive) heart failure: Secondary | ICD-10-CM | POA: Diagnosis not present

## 2023-08-22 LAB — BASIC METABOLIC PANEL WITH GFR
Anion gap: 9 (ref 5–15)
BUN: 33 mg/dL — ABNORMAL HIGH (ref 8–23)
CO2: 33 mmol/L — ABNORMAL HIGH (ref 22–32)
Calcium: 8.3 mg/dL — ABNORMAL LOW (ref 8.9–10.3)
Chloride: 96 mmol/L — ABNORMAL LOW (ref 98–111)
Creatinine, Ser: 1.23 mg/dL — ABNORMAL HIGH (ref 0.44–1.00)
GFR, Estimated: 41 mL/min — ABNORMAL LOW (ref >60)
Glucose, Bld: 93 mg/dL (ref 70–99)
Potassium: 3.3 mmol/L — ABNORMAL LOW (ref 3.5–5.1)
Sodium: 138 mmol/L (ref 135–145)

## 2023-08-22 MED ORDER — METOLAZONE 2.5 MG PO TABS
5.0000 mg | ORAL_TABLET | Freq: Once | ORAL | Status: AC
  Administered 2023-08-22: 5 mg via ORAL
  Filled 2023-08-22: qty 2

## 2023-08-22 MED ORDER — POTASSIUM CHLORIDE CRYS ER 20 MEQ PO TBCR
40.0000 meq | EXTENDED_RELEASE_TABLET | Freq: Two times a day (BID) | ORAL | Status: AC
Start: 2023-08-22 — End: 2023-08-22
  Administered 2023-08-22 (×2): 40 meq via ORAL
  Filled 2023-08-22 (×2): qty 2

## 2023-08-22 NOTE — Progress Notes (Signed)
 Orthopedic Tech Progress Note Patient Details:  Felicia Benson 1930-12-10 [id number]  Ortho Devices Type of Ortho Device: Nonie boot Ortho Device/Splint Location: BLE Ortho Device/Splint Interventions: Ordered, Application, Adjustment   Post Interventions Patient Tolerated: Well Instructions Provided: Care of device   Nadra Hritz L Matilde Markie 08/22/2023, 8:41 PM

## 2023-08-22 NOTE — Progress Notes (Addendum)
 PROGRESS NOTE    Felicia Benson  FMW:[id number] DOB: May 10, 1930 DOA: 08/17/2023 PCP: Leonidas  89 yo female, nursing home resident, with the past medical history of Crohn's disease, COPD, CKD stage 3B, history of DVT and PE sp IVC filter, who presented with dyspnea, lower extremity edema and weight gain. Progressive worsening symptoms over several days, with 3 lbs weight gain in 3 days. EMS was called and patient was brought to the hospital.  On his initial physical examination his blood pressure was 108/77, HR 91, RR 14 and 02 saturation 92% positive bilateral lower extremity edema.  Na 138, K 3,7 Cl 96 bicarbonate 31 glucose 109, bun 51 cr 2,0 , BNP 2,202 , troponin 47 and 49 CXR wcardiomegaly, bilateral hilar vascular congestion, bilateral basal interstitial infiltrates, right basal atelectasis, with small left pleural effusion. Pacemaker in place with one right artrial and on right ventricular lead.  -Placed on furosemide  for diuresis.  -8/12 volume status improving with diuresis, not yet back to baseline, using more than 5 L/min per [facility] supplemental 02    Subjective: Feels better overall  Assessment and Plan:  Acute on chronic diastolic heart failure (HCC) Pulm HTN, RV Failure Mod to Severe MR 07/2023 echo w/ EF 45 to 50%, global hypokinesis, severe LVH, mild reduction RV systolic function, moderate to severe mitral valve regurgitation, moderate tricuspid valve regurgitation, - Readmitted with 20 pound weight gain following recent hospitalization 3 weeks ago - Increase Lasix  dose to 80 mg IV bid, continue midodrine  -Urine output inaccurate, bed weights largely unchanged -Add metolazone  X1 Limited medical therapy due to reduced GFR and hypotension.  Poor candidate for SGLT2i - Has been seen by palliative care multiple times in the last few admissions, hospice has been suggested on several occasions, will discuss with daughter again - Add Unna boots  Acute on chronic hypoxemic  respiratory failure, due to acute cardiogenic pulmonary edema. -She is on 5 L home O2 at baseline  CAD S/P percutaneous coronary angioplasty No acute coronary syndrome High sensitive troponin elevation due to heart failure.  Continue aspirin  and statin   Chronic kidney disease, stage 3b (HCC) AKI, hypomagnesemia.  - Stable, continue diuretics  COPD (chronic obstructive pulmonary disease) (HCC) No signs of acute exacerbation, continue with bronchodilator therapy   Hyperlipidemia Continue statin   GERD (gastroesophageal reflux disease) Continue pantoprazole    Crohn's disease (HCC) No signs of active flare.  Continue with mesalamine   Follow up as outpatient  OSA (obstructive sleep apnea) cpap  Anemia of chronic disease Hgb stable at 9,8  Follow up cell count.   History of pulmonary embolism SP IVC filter  Obesity, class 1 Calculated BMI is 32.4   DVT prophylaxis: Hep SQ Code Status: DNR Family Communication: None present Disposition Plan: Back to SNF likely 2days  Consultants:    Procedures:   Antimicrobials:    Objective: Vitals:   08/22/23 0429 08/22/23 0748 08/22/23 0846 08/22/23 1123  BP: 107/66 123/74  128/78  Pulse: 75 83  89  Resp: 13   17  Temp: 97.9 F (36.6 C) 97.6 F (36.4 C)  98 F (36.7 C)  TempSrc: Oral Oral  Oral  SpO2: 92% 91% 91% 93%  Weight: 90.6 kg     Height:        Intake/Output Summary (Last 24 hours) at 08/22/2023 1150 Last data filed at 08/22/2023 0429 Gross per 24 hour  Intake 240 ml  Output 650 ml  Net -410 ml   Filed Weights   08/20/23  0413 08/21/23 0500 08/22/23 0429  Weight: 90.8 kg 90.8 kg 90.6 kg    Examination:  Gen: Awake, Alert, Oriented X 3,  HEENT: + JVD Lungs: Good air movement bilaterally, CTAB CVS: S1S2/RRR Abd: soft, Non tender, non distended, BS present Extremities: 2+ edema, chronic skin changes Skin: no new rashes on exposed skin     Data Reviewed:   CBC: Recent Labs  Lab  08/17/23 1953 08/18/23 0230  WBC 7.5 7.2  NEUTROABS 5.4  --   HGB 10.6* 9.8*  HCT 34.9* 31.5*  MCV 98.0 95.5  PLT 257 229   Basic Metabolic Panel: Recent Labs  Lab 08/18/23 0230 08/19/23 0228 08/20/23 0311 08/21/23 0250 08/22/23 0351  NA 138 138 136 138 138  K 3.7 3.9 3.9 3.4* 3.3*  CL 96* 95* 95* 95* 96*  CO2 32 34* 33* 33* 33*  GLUCOSE 98 113* 103* 93 93  BUN 48* 48* 41* 36* 33*  CREATININE 1.84* 1.85* 1.40* 1.47* 1.23*  CALCIUM  8.1* 8.0* 8.1* 8.3* 8.3*  MG 1.6* 1.5* 2.1 2.0  --    GFR: Estimated Creatinine Clearance: 33.8 mL/min (A) (by C-G formula based on SCr of 1.23 mg/dL (H)). Liver Function Tests: Recent Labs  Lab 08/17/23 1953  AST 24  ALT 21  ALKPHOS 82  BILITOT 0.6  PROT 6.3*  ALBUMIN  2.6*   No results for input(s): LIPASE, AMYLASE in the last 168 hours. No results for input(s): AMMONIA in the last 168 hours. Coagulation Profile: No results for input(s): INR, PROTIME in the last 168 hours. Cardiac Enzymes: No results for input(s): CKTOTAL, CKMB, CKMBINDEX, TROPONINI in the last 168 hours. BNP (last 3 results) No results for input(s): PROBNP in the last 8760 hours. HbA1C: No results for input(s): HGBA1C in the last 72 hours. CBG: No results for input(s): GLUCAP in the last 168 hours. Lipid Profile: No results for input(s): CHOL, HDL, LDLCALC, TRIG, CHOLHDL, LDLDIRECT in the last 72 hours. Thyroid Function Tests: No results for input(s): TSH, T4TOTAL, FREET4, T3FREE, THYROIDAB in the last 72 hours. Anemia Panel: No results for input(s): VITAMINB12, FOLATE, FERRITIN, TIBC, IRON, RETICCTPCT in the last 72 hours. Urine analysis:    Component Value Date/Time   COLORURINE YELLOW 12/31/2020 0100   APPEARANCEUR CLEAR 12/31/2020 0100   LABSPEC 1.020 12/31/2020 0100   PHURINE 6.0 12/31/2020 0100   GLUCOSEU NEGATIVE 12/31/2020 0100   HGBUR NEGATIVE 12/31/2020 0100   BILIRUBINUR NEGATIVE  12/31/2020 0100   KETONESUR NEGATIVE 12/31/2020 0100   PROTEINUR NEGATIVE 12/31/2020 0100   NITRITE POSITIVE (A) 12/31/2020 0100   LEUKOCYTESUR NEGATIVE 12/31/2020 0100   Sepsis Labs: @LABRCNTIP (procalcitonin:4,lacticidven:4)  ) Recent Results (from the past 240 hours)  MRSA Next Gen by PCR, Nasal     Status: Abnormal   Collection Time: 08/17/23 10:56 PM   Specimen: Nasal Mucosa; Nasal Swab  Result Value Ref Range Status   MRSA by PCR Next Gen DETECTED (A) NOT DETECTED Final    Comment: RESULT CALLED TO, READ BACK BY AND VERIFIED WITH: C KYEI RN 08/18/23 @ 0038 BY AB (NOTE) The GeneXpert MRSA Assay (FDA approved for NASAL specimens only), is one component of a comprehensive MRSA colonization surveillance program. It is not intended to diagnose MRSA infection nor to guide or monitor treatment for MRSA infections. Test performance is not FDA approved in patients less than 110 years old. Performed at [facility], [address], [place], [place] [address]      Radiology Studies: No results found.  Scheduled Meds:  aspirin  EC  81 mg Oral Daily   atorvastatin   40 mg Oral QHS   Chlorhexidine  Gluconate Cloth  6 each Topical Daily   fluticasone  furoate-vilanterol  1 puff Inhalation Daily   furosemide   80 mg Intravenous Q12H   Gerhardt's butt cream   Topical BID   heparin   5,000 Units Subcutaneous Q8H   leptospermum manuka honey  1 Application Topical Daily   mesalamine   1,000 mg Oral BID   midodrine   5 mg Oral BID WC   pantoprazole   40 mg Oral BID   potassium chloride   40 mEq Oral BID   sodium chloride  flush  3 mL Intravenous Q12H   Continuous Infusions:   LOS: 5 days    Time spent:    Sigurd Pac, MD [facility]   08/22/2023, 11:50 AM

## 2023-08-22 NOTE — Plan of Care (Signed)

## 2023-08-22 NOTE — Progress Notes (Addendum)
 Orthopedic Tech Progress Note Patient Details:  Felicia Benson April 27, 1930 [id number]  Arrived to place unna boots, but will return to complete this evening as pt is currently sleeping. Normal rise and fall of chest appreciated, pt appearing in no acute distress at this time.  Patient ID: Felicia Benson Felicia Benson, female   DOB: 1930/03/22, 89 y.o.   MRN: [id number]  Tinnie Ronal Brasil 08/22/2023, 4:10 PM

## 2023-08-22 NOTE — Progress Notes (Signed)
 Occupational Therapy Treatment Patient Details Name: Felicia Benson MRN: [id number] DOB: January 23, 1930 Today's Date: 08/22/2023   History of present illness Pt is a 89 y/o female admitted 8/44for acute CHF exacerbation. PMHx: AAA, CHF, Crohn's Disease, GERD, GIB, HTN, OSA, PPM, DVT with IVC filter,  Rt femur fx s/p IM nail   OT comments  Pt incontinent of stool upon arrival. Worked with pt on rolling left and right in bed using arms and lifting on top leg as she rolled over bottom leg as well as trying to pull hips forward while on her side to have better positioning for cleaning. She is limited at present by excess fluid in legs--but tries her best--needing min A to roll over and then MOd to to get over further. She will continue to benefit from acute OT with follow up from continued inpatient follow up therapy, <3 hours/day.       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) at baseline Restrictions Weight Bearing Restrictions Per Provider Order: No       Mobility Bed Mobility Overal bed mobility: Needs Assistance Bed Mobility: Rolling Rolling: Min assist, Max assist         General bed mobility comments: Can roll to right and left with min A (if were to be getting OOB); however to roll enough for pericare she is Mod A to get more over on her side and needs A to maintain (this is currently due to the extra fluid she has on her)              Extremity/Trunk Assessment Upper Extremity  Assessment Upper Extremity Assessment: Generalized weakness       Cervical / Trunk Assessment Cervical / Trunk Assessment: Kyphotic    Vision Baseline Vision/History: 2 Legally blind;6 Macular Degeneration Ability to See in Adequate Light: 3 Highly impaired Patient Visual Report: No change from baseline           Communication Communication Factors Affecting Communication: Hearing impaired   Cognition Arousal: Alert Behavior During Therapy: WFL for tasks assessed/performed Cognition: No apparent impairments                               Following commands: Intact        Cueing   Cueing Techniques: Verbal cues             Pertinent Vitals/ Pain       Pain Assessment Pain Assessment: No/denies pain         Frequency  Min 1X/week        Progress Toward Goals  OT Goals(current goals can now be found in the care plan section)  Progress towards OT goals: Progressing toward goals (slowly due to increased fluid)  Acute Rehab OT Goals Patient Stated Goal: to get more fluid off, return to SNF and continue with therapy OT Goal Formulation: With patient Time For Goal Achievement: 09/02/23 Potential to Achieve Goals: Good         AM-PAC OT 6 Clicks Daily Activity     Outcome Measure  Help from another person eating meals?: None Help from another person taking care of personal grooming?: A Little Help from another person toileting, which includes using toliet, bedpan, or urinal?: Total Help from another person bathing (including washing, rinsing, drying)?: A Lot Help from another person to put on and taking off regular upper body clothing?: A Lot Help from another person to put on and taking off regular lower body clothing?: Total 6 Click Score: 13    End of Session Equipment Utilized During Treatment: Oxygen  OT Visit Diagnosis: Other abnormalities of gait and mobility (R26.89);Muscle weakness (generalized) (M62.81)   Activity Tolerance  Patient tolerated treatment well   Patient Left in bed;with call bell/phone within reach;with bed alarm set   Nurse Communication  (NT in to A with moving pt up in bed (made her aware pt had been cleaned up from bowel movement and purwick replaced).)        Time: 8757-8696 OT Time Calculation (min): 21 min  Charges: OT General Charges $OT Visit: 1 Visit OT Treatments $Self Care/Home Management : 8-22 mins  Donny BECKER OT Acute Rehabilitation Services Office [phone]   [place] Dorothyann Distel 08/22/2023, 1:32 PM

## 2023-08-23 DIAGNOSIS — I5033 Acute on chronic diastolic (congestive) heart failure: Secondary | ICD-10-CM | POA: Diagnosis not present

## 2023-08-23 LAB — BASIC METABOLIC PANEL WITH GFR
Anion gap: 11 (ref 5–15)
BUN: 35 mg/dL — ABNORMAL HIGH (ref 8–23)
CO2: 31 mmol/L (ref 22–32)
Calcium: 8.5 mg/dL — ABNORMAL LOW (ref 8.9–10.3)
Chloride: 95 mmol/L — ABNORMAL LOW (ref 98–111)
Creatinine, Ser: 1.33 mg/dL — ABNORMAL HIGH (ref 0.44–1.00)
GFR, Estimated: 38 mL/min — ABNORMAL LOW (ref >60)
Glucose, Bld: 97 mg/dL (ref 70–99)
Potassium: 4.4 mmol/L (ref 3.5–5.1)
Sodium: 137 mmol/L (ref 135–145)

## 2023-08-23 LAB — ALBUMIN: Albumin: 2.4 g/dL — ABNORMAL LOW (ref 3.5–5.0)

## 2023-08-23 MED ORDER — METOLAZONE 2.5 MG PO TABS
5.0000 mg | ORAL_TABLET | Freq: Once | ORAL | Status: AC
  Administered 2023-08-23: 5 mg via ORAL
  Filled 2023-08-23: qty 2

## 2023-08-23 MED ORDER — POTASSIUM CHLORIDE CRYS ER 20 MEQ PO TBCR
40.0000 meq | EXTENDED_RELEASE_TABLET | Freq: Once | ORAL | Status: AC
  Administered 2023-08-23: 40 meq via ORAL
  Filled 2023-08-23: qty 2

## 2023-08-23 NOTE — Progress Notes (Signed)
 PROGRESS NOTE    Felicia Benson  FMW:[id number] DOB: 1930/04/25 DOA: 08/17/2023 PCP: Leonidas  89 yo female, nursing home resident, with the past medical history of Crohn's disease, COPD, CKD stage 3B, history of DVT and PE sp IVC filter, who presented with dyspnea, lower extremity edema and weight gain. Progressive worsening symptoms over several days, with 3 lbs weight gain in 3 days. EMS was called and patient was brought to the hospital.  On his initial physical examination his blood pressure was 108/77, HR 91, RR 14 and 02 saturation 92% positive bilateral lower extremity edema.  Na 138, K 3,7 Cl 96 bicarbonate 31 glucose 109, bun 51 cr 2,0 , BNP 2,202 , troponin 47 and 49 CXR wcardiomegaly, bilateral hilar vascular congestion, bilateral basal interstitial infiltrates, right basal atelectasis, with small left pleural effusion. Pacemaker in place with one right artrial and on right ventricular lead.  -Placed on furosemide  for diuresis.  -8/12 volume status improving with diuresis, not yet back to baseline, using more than 5 L/min per [place] supplemental 02    Subjective: -Urinated more after metolazone  yesterday  Assessment and Plan:  Acute on chronic diastolic heart failure (HCC) Pulm HTN, RV Failure Mod to Severe MR 07/2023 echo w/ EF 45 to 50%, global hypokinesis, severe LVH, mild reduction RV systolic function, moderate to severe mitral valve regurgitation, moderate tricuspid valve regurgitation, - Readmitted with 20 pound weight gain following recent hospitalization 3 weeks ago - Continue Lasix  80 mg twice daily, continue midodrine , repeat metolazone  today - Unfortunately bed weights inaccurate and urine output not documented appropriately -GDMT limited by CKD, hypotension, Poor candidate for SGLT2i - Has been seen by palliative care multiple times in the last few admissions, hospice has been suggested on several occasions, will discuss with daughter again  Acute on chronic  hypoxemic respiratory failure, due to acute cardiogenic pulmonary edema. -She is on 5 L home O2 at baseline  CAD S/P percutaneous coronary angioplasty No acute coronary syndrome High sensitive troponin elevation due to heart failure.  Continue aspirin  and statin   Chronic kidney disease, stage 3b (HCC) AKI, hypomagnesemia.  - Stable, continue diuretics  COPD (chronic obstructive pulmonary disease) (HCC) No signs of acute exacerbation, continue with bronchodilator therapy   Hyperlipidemia Continue statin   GERD (gastroesophageal reflux disease) Continue pantoprazole    Crohn's disease (HCC) No signs of active flare.  Continue with mesalamine   Follow up as outpatient  OSA (obstructive sleep apnea) cpap  Anemia of chronic disease Hgb stable at 9,8  Follow up cell count.   History of pulmonary embolism SP IVC filter  Obesity, class 1 Calculated BMI is 32.4   DVT prophylaxis: Hep SQ Code Status: DNR Family Communication: None present Disposition Plan: Back to SNF likely 1 to 2 days  Consultants:    Procedures:   Antimicrobials:    Objective: Vitals:   08/23/23 0510 08/23/23 0836 08/23/23 0926 08/23/23 1139  BP:  104/61  122/81  Pulse:  94  73  Resp:  16  17  Temp:  98.2 F (36.8 C)  98.1 F (36.7 C)  TempSrc:  Oral  Oral  SpO2:  94%  94%  Weight: 90.5 kg  93.2 kg   Height:        Intake/Output Summary (Last 24 hours) at 08/23/2023 1141 Last data filed at 08/23/2023 0939 Gross per 24 hour  Intake 118 ml  Output 1040 ml  Net -922 ml   Filed Weights   08/22/23 0429 08/23/23 0510 08/23/23  9073  Weight: 90.6 kg 90.5 kg 93.2 kg    Examination:  Gen: Awake, Alert, Oriented X 3,  HEENT: Positive JVD Lungs: Good air movement bilaterally, CTAB CVS: S1S2/RRR Abd: soft, Non tender, non distended, BS present Extremities: Plus edema, chronic skin changes Skin: no new rashes on exposed skin     Data Reviewed:   CBC: Recent Labs  Lab  08/17/23 1953 08/18/23 0230  WBC 7.5 7.2  NEUTROABS 5.4  --   HGB 10.6* 9.8*  HCT 34.9* 31.5*  MCV 98.0 95.5  PLT 257 229   Basic Metabolic Panel: Recent Labs  Lab 08/18/23 0230 08/19/23 0228 08/20/23 0311 08/21/23 0250 08/22/23 0351 08/23/23 0140  NA 138 138 136 138 138 137  K 3.7 3.9 3.9 3.4* 3.3* 4.4  CL 96* 95* 95* 95* 96* 95*  CO2 32 34* 33* 33* 33* 31  GLUCOSE 98 113* 103* 93 93 97  BUN 48* 48* 41* 36* 33* 35*  CREATININE 1.84* 1.85* 1.40* 1.47* 1.23* 1.33*  CALCIUM  8.1* 8.0* 8.1* 8.3* 8.3* 8.5*  MG 1.6* 1.5* 2.1 2.0  --   --    GFR: Estimated Creatinine Clearance: 31.7 mL/min (A) (by C-G formula based on SCr of 1.33 mg/dL (H)). Liver Function Tests: Recent Labs  Lab 08/17/23 1953 08/23/23 0140  AST 24  --   ALT 21  --   ALKPHOS 82  --   BILITOT 0.6  --   PROT 6.3*  --   ALBUMIN  2.6* 2.4*   No results for input(s): LIPASE, AMYLASE in the last 168 hours. No results for input(s): AMMONIA in the last 168 hours. Coagulation Profile: No results for input(s): INR, PROTIME in the last 168 hours. Cardiac Enzymes: No results for input(s): CKTOTAL, CKMB, CKMBINDEX, TROPONINI in the last 168 hours. BNP (last 3 results) No results for input(s): PROBNP in the last 8760 hours. HbA1C: No results for input(s): HGBA1C in the last 72 hours. CBG: No results for input(s): GLUCAP in the last 168 hours. Lipid Profile: No results for input(s): CHOL, HDL, LDLCALC, TRIG, CHOLHDL, LDLDIRECT in the last 72 hours. Thyroid Function Tests: No results for input(s): TSH, T4TOTAL, FREET4, T3FREE, THYROIDAB in the last 72 hours. Anemia Panel: No results for input(s): VITAMINB12, FOLATE, FERRITIN, TIBC, IRON, RETICCTPCT in the last 72 hours. Urine analysis:    Component Value Date/Time   COLORURINE YELLOW 12/31/2020 0100   APPEARANCEUR CLEAR 12/31/2020 0100   LABSPEC 1.020 12/31/2020 0100   PHURINE 6.0 12/31/2020 0100    GLUCOSEU NEGATIVE 12/31/2020 0100   HGBUR NEGATIVE 12/31/2020 0100   BILIRUBINUR NEGATIVE 12/31/2020 0100   KETONESUR NEGATIVE 12/31/2020 0100   PROTEINUR NEGATIVE 12/31/2020 0100   NITRITE POSITIVE (A) 12/31/2020 0100   LEUKOCYTESUR NEGATIVE 12/31/2020 0100   Sepsis Labs: @LABRCNTIP (procalcitonin:4,lacticidven:4)  ) Recent Results (from the past 240 hours)  MRSA Next Gen by PCR, Nasal     Status: Abnormal   Collection Time: 08/17/23 10:56 PM   Specimen: Nasal Mucosa; Nasal Swab  Result Value Ref Range Status   MRSA by PCR Next Gen DETECTED (A) NOT DETECTED Final    Comment: RESULT CALLED TO, READ BACK BY AND VERIFIED WITH: C KYEI RN 08/18/23 @ 0038 BY AB (NOTE) The GeneXpert MRSA Assay (FDA approved for NASAL specimens only), is one component of a comprehensive MRSA colonization surveillance program. It is not intended to diagnose MRSA infection nor to guide or monitor treatment for MRSA infections. Test performance is not FDA approved in patients less than  29 years old. Performed at [facility], [address] [place], [place] [address]      Radiology Studies: No results found.   Scheduled Meds:  aspirin  EC  81 mg Oral Daily   atorvastatin   40 mg Oral QHS   fluticasone  furoate-vilanterol  1 puff Inhalation Daily   furosemide   80 mg Intravenous Q12H   Gerhardt's butt cream   Topical BID   heparin   5,000 Units Subcutaneous Q8H   leptospermum manuka honey  1 Application Topical Daily   mesalamine   1,000 mg Oral BID   midodrine   5 mg Oral BID WC   pantoprazole   40 mg Oral BID   sodium chloride  flush  3 mL Intravenous Q12H   Continuous Infusions:   LOS: 6 days    Time spent:    Sigurd Pac, MD [facility]   08/23/2023, 11:41 AM

## 2023-08-23 NOTE — Progress Notes (Signed)
 Physical Therapy Treatment Patient Details Name: Felicia Benson MRN: [id number] DOB: 10/13/1930 Today's Date: 08/23/2023   History of Present Illness Pt is a 89 y/o female admitted 8/11for acute CHF exacerbation. PMHx: AAA, CHF, Crohn's Disease, GERD, GIB, HTN, OSA, PPM, DVT with IVC filter,  Rt femur fx s/p IM nail    PT Comments  Tolerated treatment well, moving a little better today. Still required up to 7L to mobilize with SpO2 88% or greater. Resting Spo2 91% on 6L, 85% with exertion on 6L. Less assistance with bed mobility and sit to stand transfer from bed. A little more of a struggle from recliner but reviewed techniques, specifically scooting close to edge and pushing up from arm rests of chair. Reviewed LE exercises and tolerated these well. Patient will continue to benefit from skilled physical therapy services to further improve independence with functional mobility.     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 Trunk supported, cues for rail use. Scoots forward with light assist.    Transfers Overall transfer level: Needs  assistance Equipment used: Rolling walker (2 wheels) Transfers: Sit to/from Stand, Bed to chair/wheelchair/BSC Sit to Stand: Min assist   Step pivot transfers: Min assist       General transfer comment: Light min assist for boost to stand from bed and a little more boost from recliner with cues to scoot to edge and hand placement prior to rising. Min assist for step pivot, better anterior weight shift to steady herself with RW today. Easily fatigued. Spo2 85% on 6L, up to 90% on 7L. minimal dyspnea.    Ambulation/Gait               General Gait Details: Deferred, sats drop rapidly,   Stairs             Wheelchair Mobility     Tilt Bed    Modified Rankin (Stroke Patients Only)       Balance Overall balance assessment: Needs assistance Sitting-balance support: Feet supported, No upper extremity supported Sitting balance-Leahy Scale: Fair     Standing balance support: Bilateral upper extremity supported, During functional activity Standing balance-Leahy Scale: Poor                              Communication Communication Communication: Impaired Factors Affecting Communication: Hearing impaired  Cognition Arousal: Alert Behavior During Therapy: WFL for tasks assessed/performed   PT - Cognitive impairments: No apparent impairments                         Following commands: Intact      Cueing Cueing Techniques: Verbal cues  Exercises General Exercises - Lower Extremity Ankle Circles/Pumps: AROM, Both, 10 reps, Seated Quad Sets: Strengthening, Both, 10 reps, Seated Gluteal Sets: Strengthening, Both, 10 reps, Seated Hip ABduction/ADduction: Strengthening, Both, 10 reps, Supine    General Comments General comments (skin integrity, edema, etc.): SpO2 91% on 6L at rest. 85% with exertion. 89-90% on 7L      Pertinent Vitals/Pain Pain Assessment Pain Assessment: No/denies pain    Home Living                           Prior Function            PT Goals (current goals can now be found in the care plan section) Acute Rehab PT Goals Patient Stated Goal: Get well return to rehab PT Goal Formulation: With patient Time For Goal Achievement: 09/03/23 Potential to Achieve Goals: Good Progress towards PT goals: Progressing toward goals    Frequency    Min 1X/week      PT Plan      Co-evaluation              AM-PAC PT 6 Clicks Mobility   Outcome Measure  Help needed turning from your back to your side while in a flat bed without using bedrails?: A Little Help needed moving from lying on your back to sitting on the side of a flat bed without using bedrails?: A Little Help needed moving to and from a bed to a chair (including a wheelchair)?: A Little Help needed standing up from a chair using your arms (e.g., wheelchair or bedside chair)?: A Little Help needed to walk in hospital room?: A Lot Help needed climbing 3-5 steps with a railing? : Total 6 Click Score: 15    End of Session Equipment Utilized During Treatment: Gait belt;Oxygen Activity Tolerance: Patient limited by fatigue Patient left: in chair;with call bell/phone within reach;with chair alarm set;with nursing/sitter in room Nurse Communication: Mobility status PT Visit Diagnosis: Other abnormalities of gait and mobility (R26.89);Difficulty in walking, not elsewhere classified (R26.2);Muscle weakness (generalized) (M62.81)     Time: 1000-1021 PT Time Calculation (min) (ACUTE ONLY): 21 min  Charges:    $Therapeutic Activity: 8-22 mins PT General Charges $$ ACUTE PT VISIT: 1 Visit                     Leontine Roads, PT, DPT [facility]  Rehabilitation Services Physical Therapist Office: [phone] Website: [email or web address]     Leontine GORMAN Roads 08/23/2023, 11:52 AM

## 2023-08-23 NOTE — Progress Notes (Signed)
   08/23/23 2250  BiPAP/CPAP/SIPAP  BiPAP/CPAP/SIPAP Pt Type Adult  BiPAP/CPAP/SIPAP  (Home Unit)  Patient Home Machine Yes  Safety Check Completed by RT for Home Unit Yes, no issues noted  Patient Home Mask Yes  Patient Home Tubing Yes  BiPAP/CPAP /SiPAP Vitals  Pulse Rate 87  Resp 17  SpO2 92 %  MEWS Score/Color  MEWS Score 0  MEWS Score Color Green

## 2023-08-23 NOTE — Progress Notes (Signed)
 PT Cancellation Note  Patient Details Name: Felicia Benson MRN: [id number] DOB: 10-17-1930   Cancelled Treatment:    Reason Eval/Treat Not Completed: Patient declined, no reason specified  Politely asks PT to return later. She would like to bed cleaned up by staff prior to working with therapist.  Will follow-up later today. Plan to work on transfers out of bed.  Leontine Roads, PT, DPT [facility]  Rehabilitation Services Physical Therapist Office: [phone] Website: [email or web address]  Leontine GORMAN Roads 08/23/2023, 8:59 AM

## 2023-08-23 NOTE — TOC Progression Note (Signed)
 Transition of Care [facility]) - Progression Note    Patient Details  Name: Felicia Benson MRN: [id number] Date of Birth: Jul 25, 1930  [facility] CM/SW Contact  Lauraine FORBES Saa, LCSWA Phone Number: 08/23/2023, 10:10 AM  Clinical Narrative:     10:10 AM Per MD, patient is expected to discharge back to [facility] SNF LTC this weekend. CSW relayed information to SNF admissions. CSW will continue to follow and be available to assist.  Expected Discharge Plan: Long Term Nursing Home Barriers to Discharge: Continued Medical Work up               Expected Discharge Plan and Services In-house Referral: Clinical Social Work   Post Acute Care Choice: Skilled Nursing Facility, Nursing Home Living arrangements for the past 2 months: Skilled Nursing Facility                                       Social Drivers of Health (SDOH) Interventions SDOH Screenings   Food Insecurity: No Food Insecurity (08/17/2023)  Housing: Low Risk  (08/17/2023)  Transportation Needs: No Transportation Needs (08/17/2023)  Utilities: Not At Risk (08/17/2023)  Social Connections: Moderately Integrated (08/17/2023)  Recent Concern: Social Connections - Moderately Isolated (07/24/2023)  Tobacco Use: Medium Risk (08/17/2023)    Readmission Risk Interventions    07/26/2023   11:43 AM 07/24/2022    2:49 PM  Readmission Risk Prevention Plan  Transportation Screening Complete Complete  PCP or Specialist Appt within 5-7 Days  Complete  Home Care Screening  Complete  Medication Review (RN CM)  Complete  Medication Review (RN Care Manager) Complete   PCP or Specialist appointment within 3-5 days of discharge Complete   HRI or Home Care Consult Complete   Palliative Care Screening Not Applicable   Skilled Nursing Facility Not Applicable

## 2023-08-24 DIAGNOSIS — I5033 Acute on chronic diastolic (congestive) heart failure: Secondary | ICD-10-CM | POA: Diagnosis not present

## 2023-08-24 LAB — BASIC METABOLIC PANEL WITH GFR
Anion gap: 11 (ref 5–15)
BUN: 34 mg/dL — ABNORMAL HIGH (ref 8–23)
CO2: 34 mmol/L — ABNORMAL HIGH (ref 22–32)
Calcium: 8.4 mg/dL — ABNORMAL LOW (ref 8.9–10.3)
Chloride: 90 mmol/L — ABNORMAL LOW (ref 98–111)
Creatinine, Ser: 1.48 mg/dL — ABNORMAL HIGH (ref 0.44–1.00)
GFR, Estimated: 33 mL/min — ABNORMAL LOW (ref >60)
Glucose, Bld: 94 mg/dL (ref 70–99)
Potassium: 4.4 mmol/L (ref 3.5–5.1)
Sodium: 135 mmol/L (ref 135–145)

## 2023-08-24 NOTE — Progress Notes (Signed)
 PROGRESS NOTE    Felicia Benson  FMW:[id number] DOB: 05-02-1930 DOA: 08/17/2023 PCP: Leonidas  89 yo female, nursing home resident, with the past medical history of Crohn's disease, COPD, CKD stage 3B, history of DVT and PE sp IVC filter, who presented with dyspnea, lower extremity edema and weight gain. Progressive worsening symptoms over several days, with 3 lbs weight gain in 3 days. EMS was called and patient was brought to the hospital.  On his initial physical examination his blood pressure was 108/77, HR 91, RR 14 and 02 saturation 92% positive bilateral lower extremity edema.  Na 138, K 3,7 Cl 96 bicarbonate 31 glucose 109, bun 51 cr 2,0 , BNP 2,202 , troponin 47 and 49 CXR wcardiomegaly, bilateral hilar vascular congestion, bilateral basal interstitial infiltrates, right basal atelectasis, with small left pleural effusion. Pacemaker in place with one right artrial and on right ventricular lead.  -Placed on furosemide  for diuresis.  -8/12 volume status improving with diuresis, not yet back to baseline, using more than 5 L/min per [place] supplemental 02    Subjective: -Urinated more after metolazone  yesterday  Assessment and Plan:  Acute on chronic diastolic heart failure (HCC) Pulm HTN, RV Failure Mod to Severe MR 07/2023 echo w/ EF 45 to 50%, global hypokinesis, severe LVH, mild reduction RV systolic function, moderate to severe mitral valve regurgitation, moderate tricuspid valve regurgitation, - Readmitted with 20 pound weight gain following recent hospitalization 3 weeks ago - Continue Lasix  80 mg twice daily, midodrine , weight finally trending down,, will switch to oral torsemide  tomorrow - Urine output is inaccurate -GDMT limited by CKD, hypotension, Poor candidate for SGLT2i - Has been seen by palliative care multiple times in the last few admissions, hospice has been suggested on several occasions, will discuss with daughter again  Acute on chronic hypoxemic respiratory  failure, due to acute cardiogenic pulmonary edema. -She is on 5 L home O2 at baseline  CAD S/P percutaneous coronary angioplasty No acute coronary syndrome High sensitive troponin elevation due to heart failure.  Continue aspirin  and statin   Chronic kidney disease, stage 3b (HCC) AKI, hypomagnesemia.  - Stable, continue diuretics  COPD (chronic obstructive pulmonary disease) (HCC) No signs of acute exacerbation, continue with bronchodilator therapy   Hyperlipidemia Continue statin   GERD (gastroesophageal reflux disease) Continue pantoprazole    Crohn's disease (HCC) No signs of active flare.  Continue with mesalamine   Follow up as outpatient  OSA (obstructive sleep apnea) cpap  Anemia of chronic disease Hgb stable at 9,8  Follow up cell count.   History of pulmonary embolism SP IVC filter  Obesity, class 1 Calculated BMI is 32.4   DVT prophylaxis: Hep SQ Code Status: DNR Family Communication: None present Disposition Plan: Back to SNF likely tomorrow or Monday  Consultants:    Procedures:   Antimicrobials:    Objective: Vitals:   08/24/23 0435 08/24/23 0654 08/24/23 0811 08/24/23 0855  BP: 103/67     Pulse:    88  Resp:    19  Temp: 98 F (36.7 C)  98 F (36.7 C)   TempSrc: Oral  Oral   SpO2:    90%  Weight:  87.3 kg    Height:        Intake/Output Summary (Last 24 hours) at 08/24/2023 1052 Last data filed at 08/24/2023 1000 Gross per 24 hour  Intake 837 ml  Output 3200 ml  Net -2363 ml   Filed Weights   08/23/23 0510 08/23/23 0926 08/24/23 9345  Weight: 90.5 kg 93.2 kg 87.3 kg    Examination:  Gen: Awake, Alert, Oriented X 3,  HEENT: Positive JVD Lungs: Good air movement bilaterally, CTAB CVS: S1S2/RRR Abd: soft, Non tender, non distended, BS present Extremities: Plus edema, chronic skin changes Skin: no new rashes on exposed skin     Data Reviewed:   CBC: Recent Labs  Lab 08/17/23 1953 08/18/23 0230  WBC 7.5 7.2   NEUTROABS 5.4  --   HGB 10.6* 9.8*  HCT 34.9* 31.5*  MCV 98.0 95.5  PLT 257 229   Basic Metabolic Panel: Recent Labs  Lab 08/18/23 0230 08/19/23 0228 08/20/23 0311 08/21/23 0250 08/22/23 0351 08/23/23 0140 08/24/23 0220  NA 138 138 136 138 138 137 135  K 3.7 3.9 3.9 3.4* 3.3* 4.4 4.4  CL 96* 95* 95* 95* 96* 95* 90*  CO2 32 34* 33* 33* 33* 31 34*  GLUCOSE 98 113* 103* 93 93 97 94  BUN 48* 48* 41* 36* 33* 35* 34*  CREATININE 1.84* 1.85* 1.40* 1.47* 1.23* 1.33* 1.48*  CALCIUM  8.1* 8.0* 8.1* 8.3* 8.3* 8.5* 8.4*  MG 1.6* 1.5* 2.1 2.0  --   --   --    GFR: Estimated Creatinine Clearance: 27.6 mL/min (A) (by C-G formula based on SCr of 1.48 mg/dL (H)). Liver Function Tests: Recent Labs  Lab 08/17/23 1953 08/23/23 0140  AST 24  --   ALT 21  --   ALKPHOS 82  --   BILITOT 0.6  --   PROT 6.3*  --   ALBUMIN  2.6* 2.4*   No results for input(s): LIPASE, AMYLASE in the last 168 hours. No results for input(s): AMMONIA in the last 168 hours. Coagulation Profile: No results for input(s): INR, PROTIME in the last 168 hours. Cardiac Enzymes: No results for input(s): CKTOTAL, CKMB, CKMBINDEX, TROPONINI in the last 168 hours. BNP (last 3 results) No results for input(s): PROBNP in the last 8760 hours. HbA1C: No results for input(s): HGBA1C in the last 72 hours. CBG: No results for input(s): GLUCAP in the last 168 hours. Lipid Profile: No results for input(s): CHOL, HDL, LDLCALC, TRIG, CHOLHDL, LDLDIRECT in the last 72 hours. Thyroid Function Tests: No results for input(s): TSH, T4TOTAL, FREET4, T3FREE, THYROIDAB in the last 72 hours. Anemia Panel: No results for input(s): VITAMINB12, FOLATE, FERRITIN, TIBC, IRON, RETICCTPCT in the last 72 hours. Urine analysis:    Component Value Date/Time   COLORURINE YELLOW 12/31/2020 0100   APPEARANCEUR CLEAR 12/31/2020 0100   LABSPEC 1.020 12/31/2020 0100   PHURINE 6.0  12/31/2020 0100   GLUCOSEU NEGATIVE 12/31/2020 0100   HGBUR NEGATIVE 12/31/2020 0100   BILIRUBINUR NEGATIVE 12/31/2020 0100   KETONESUR NEGATIVE 12/31/2020 0100   PROTEINUR NEGATIVE 12/31/2020 0100   NITRITE POSITIVE (A) 12/31/2020 0100   LEUKOCYTESUR NEGATIVE 12/31/2020 0100   Sepsis Labs: @LABRCNTIP (procalcitonin:4,lacticidven:4)  ) Recent Results (from the past 240 hours)  MRSA Next Gen by PCR, Nasal     Status: Abnormal   Collection Time: 08/17/23 10:56 PM   Specimen: Nasal Mucosa; Nasal Swab  Result Value Ref Range Status   MRSA by PCR Next Gen DETECTED (A) NOT DETECTED Final    Comment: RESULT CALLED TO, READ BACK BY AND VERIFIED WITH: C KYEI RN 08/18/23 @ 0038 BY AB (NOTE) The GeneXpert MRSA Assay (FDA approved for NASAL specimens only), is one component of a comprehensive MRSA colonization surveillance program. It is not intended to diagnose MRSA infection nor to guide or monitor treatment for MRSA  infections. Test performance is not FDA approved in patients less than 17 years old. Performed at [facility], [address] [place], [place] [address]      Radiology Studies: No results found.   Scheduled Meds:  aspirin  EC  81 mg Oral Daily   atorvastatin   40 mg Oral QHS   fluticasone  furoate-vilanterol  1 puff Inhalation Daily   furosemide   80 mg Intravenous Q12H   Gerhardt's butt cream   Topical BID   heparin   5,000 Units Subcutaneous Q8H   leptospermum manuka honey  1 Application Topical Daily   mesalamine   1,000 mg Oral BID   midodrine   5 mg Oral BID WC   pantoprazole   40 mg Oral BID   sodium chloride  flush  3 mL Intravenous Q12H   Continuous Infusions:   LOS: 7 days    Time spent:    Sigurd Pac, MD [facility]   08/24/2023, 10:52 AM

## 2023-08-24 NOTE — Progress Notes (Signed)
   08/24/23 2325  BiPAP/CPAP/SIPAP  BiPAP/CPAP/SIPAP Pt Type Adult  BiPAP/CPAP/SIPAP Resmed  Reason BIPAP/CPAP not in use Non-compliant  Mask Type Full face mask  Patient Home Machine Yes  Safety Check Completed by RT for Home Unit Yes, no issues noted  Patient Home Mask Yes  Patient Home Tubing Yes  Auto Titrate No  Device Plugged into RED Power Outlet Yes

## 2023-08-24 NOTE — Plan of Care (Signed)
   Problem: Education: Goal: Knowledge of General Education information will improve Description Including pain rating scale, medication(s)/side effects and non-pharmacologic comfort measures Outcome: Progressing

## 2023-08-24 NOTE — Progress Notes (Signed)
 Questioned pt about obtaining a standing weight; pt stated, I have already fallen twice on the same floor on my previous admissions. I fall a third time, [facility] gets sued.  Bed weight obtained.  Lonell LITTIE Lyme, RN

## 2023-08-25 DIAGNOSIS — I5033 Acute on chronic diastolic (congestive) heart failure: Secondary | ICD-10-CM | POA: Diagnosis not present

## 2023-08-25 LAB — BASIC METABOLIC PANEL WITH GFR
Anion gap: 10 (ref 5–15)
BUN: 35 mg/dL — ABNORMAL HIGH (ref 8–23)
CO2: 37 mmol/L — ABNORMAL HIGH (ref 22–32)
Calcium: 8.7 mg/dL — ABNORMAL LOW (ref 8.9–10.3)
Chloride: 90 mmol/L — ABNORMAL LOW (ref 98–111)
Creatinine, Ser: 1.59 mg/dL — ABNORMAL HIGH (ref 0.44–1.00)
GFR, Estimated: 30 mL/min — ABNORMAL LOW (ref >60)
Glucose, Bld: 95 mg/dL (ref 70–99)
Potassium: 4.9 mmol/L (ref 3.5–5.1)
Sodium: 137 mmol/L (ref 135–145)

## 2023-08-25 MED ORDER — DICLOFENAC SODIUM 1 % EX GEL
2.0000 g | Freq: Two times a day (BID) | CUTANEOUS | Status: DC | PRN
  Administered 2023-08-25 – 2023-08-26 (×2): 2 g via TOPICAL
  Filled 2023-08-25: qty 100

## 2023-08-25 MED ORDER — POLYETHYLENE GLYCOL 3350 17 G PO PACK
17.0000 g | PACK | Freq: Every day | ORAL | Status: DC
  Administered 2023-08-25: 17 g via ORAL
  Filled 2023-08-25: qty 1

## 2023-08-25 MED ORDER — TORSEMIDE 20 MG PO TABS
40.0000 mg | ORAL_TABLET | Freq: Two times a day (BID) | ORAL | Status: DC
  Administered 2023-08-25 – 2023-08-26 (×3): 40 mg via ORAL
  Filled 2023-08-25 (×3): qty 2

## 2023-08-25 NOTE — Progress Notes (Signed)
 PROGRESS NOTE    Felicia Benson  FMW:[id number] DOB: 31-Jul-1930 DOA: 08/17/2023 PCP: Leonidas  89 yo female, nursing home resident, with the past medical history of Crohn's disease, COPD, CKD stage 3B, history of DVT and PE sp IVC filter, who presented with dyspnea, lower extremity edema and weight gain. Progressive worsening symptoms over several days, with 3 lbs weight gain in 3 days. EMS was called and patient was brought to the hospital.  On his initial physical examination his blood pressure was 108/77, HR 91, RR 14 and 02 saturation 92% positive bilateral lower extremity edema.  Na 138, K 3,7 Cl 96 bicarbonate 31 glucose 109, bun 51 cr 2,0 , BNP 2,202 , troponin 47 and 49 CXR wcardiomegaly, bilateral hilar vascular congestion, bilateral basal interstitial infiltrates, right basal atelectasis, with small left pleural effusion. Pacemaker in place with one right artrial and on right ventricular lead.  -Placed on furosemide  for diuresis.  -8/12 volume status improving with diuresis, not yet back to baseline, using more than 5 L/min per  supplemental 02    Subjective: -Urinated more after metolazone  yesterday  Assessment and Plan:  Acute on chronic diastolic heart failure (HCC) Pulm HTN, RV Failure Mod to Severe MR 07/2023 echo w/ EF 45 to 50%, global hypokinesis, severe LVH, mild reduction RV systolic function, moderate to severe mitral valve regurgitation, moderate tricuspid valve regurgitation, - Readmitted with 20 pound weight gain following recent hospitalization 3 weeks ago - Diuresed with IV Lasix , metolazone , clinically improving, down 16 LB  - Switch to oral torsemide  today 40 mg twice daily -GDMT limited by CKD, hypotension, Poor candidate for SGLT2i - Has been seen by palliative care multiple times in the last few admissions, hospice has been suggested on several occasions, patient continues to refuse hospice  Acute on chronic hypoxemic respiratory failure, due to acute  cardiogenic pulmonary edema. -She is on 5 L home O2 at baseline  CAD S/P percutaneous coronary angioplasty No acute coronary syndrome High sensitive troponin elevation due to heart failure.  Continue aspirin  and statin   Chronic kidney disease, stage 3b (HCC) AKI, hypomagnesemia.  - Stable, continue diuretics  COPD (chronic obstructive pulmonary disease) (HCC) No signs of acute exacerbation, continue with bronchodilator therapy   Hyperlipidemia Continue statin   GERD (gastroesophageal reflux disease) Continue pantoprazole    Crohn's disease (HCC) No signs of active flare.  Continue with mesalamine   Follow up as outpatient  OSA (obstructive sleep apnea) cpap  Anemia of chronic disease Hgb stable at 9,8  Follow up cell count.   History of pulmonary embolism SP IVC filter  Obesity, class 1 Calculated BMI is 32.4   DVT prophylaxis: Hep SQ Code Status: DNR Family Communication: None present Disposition Plan: Back to SNF Monday  Consultants:    Procedures:   Antimicrobials:    Objective: Vitals:   08/24/23 2247 08/25/23 0418 08/25/23 0737 08/25/23 0856  BP: 95/60 109/73 107/63   Pulse: 83 95 89 92  Resp: 20 20 17 18   Temp: 97.9 F (36.6 C) 98.1 F (36.7 C) 98.1 F (36.7 C)   TempSrc: Oral Oral Oral   SpO2: 92% 92% 92% 90%  Weight:  84.8 kg    Height:        Intake/Output Summary (Last 24 hours) at 08/25/2023 1026 Last data filed at 08/25/2023 0852 Gross per 24 hour  Intake 360 ml  Output 1700 ml  Net -1340 ml   Filed Weights   08/23/23 0926 08/24/23 0654 08/25/23 0418  Weight:  93.2 kg 87.3 kg 84.8 kg    Examination:  Gen: Awake, Alert, Oriented X 3,  HEENT: no JVD Lungs: Good air movement bilaterally, CTAB CVS: S1S2/RRR Abd: soft, Non tender, non distended, BS present Extremities: Trace edema, chronic skin changes Skin: no new rashes on exposed skin     Data Reviewed:   CBC: No results for input(s): WBC, NEUTROABS, HGB,  HCT, MCV, PLT in the last 168 hours.  Basic Metabolic Panel: Recent Labs  Lab 08/19/23 0228 08/20/23 0311 08/21/23 0250 08/22/23 0351 08/23/23 0140 08/24/23 0220 08/25/23 0210  NA 138 136 138 138 137 135 137  K 3.9 3.9 3.4* 3.3* 4.4 4.4 4.9  CL 95* 95* 95* 96* 95* 90* 90*  CO2 34* 33* 33* 33* 31 34* 37*  GLUCOSE 113* 103* 93 93 97 94 95  BUN 48* 41* 36* 33* 35* 34* 35*  CREATININE 1.85* 1.40* 1.47* 1.23* 1.33* 1.48* 1.59*  CALCIUM  8.0* 8.1* 8.3* 8.3* 8.5* 8.4* 8.7*  MG 1.5* 2.1 2.0  --   --   --   --    GFR: Estimated Creatinine Clearance: 25.3 mL/min (A) (by C-G formula based on SCr of 1.59 mg/dL (H)). Liver Function Tests: Recent Labs  Lab 08/23/23 0140  ALBUMIN  2.4*   No results for input(s): LIPASE, AMYLASE in the last 168 hours. No results for input(s): AMMONIA in the last 168 hours. Coagulation Profile: No results for input(s): INR, PROTIME in the last 168 hours. Cardiac Enzymes: No results for input(s): CKTOTAL, CKMB, CKMBINDEX, TROPONINI in the last 168 hours. BNP (last 3 results) No results for input(s): PROBNP in the last 8760 hours. HbA1C: No results for input(s): HGBA1C in the last 72 hours. CBG: No results for input(s): GLUCAP in the last 168 hours. Lipid Profile: No results for input(s): CHOL, HDL, LDLCALC, TRIG, CHOLHDL, LDLDIRECT in the last 72 hours. Thyroid Function Tests: No results for input(s): TSH, T4TOTAL, FREET4, T3FREE, THYROIDAB in the last 72 hours. Anemia Panel: No results for input(s): VITAMINB12, FOLATE, FERRITIN, TIBC, IRON, RETICCTPCT in the last 72 hours. Urine analysis:    Component Value Date/Time   COLORURINE YELLOW 12/31/2020 0100   APPEARANCEUR CLEAR 12/31/2020 0100   LABSPEC 1.020 12/31/2020 0100   PHURINE 6.0 12/31/2020 0100   GLUCOSEU NEGATIVE 12/31/2020 0100   HGBUR NEGATIVE 12/31/2020 0100   BILIRUBINUR NEGATIVE 12/31/2020 0100   KETONESUR NEGATIVE  12/31/2020 0100   PROTEINUR NEGATIVE 12/31/2020 0100   NITRITE POSITIVE (A) 12/31/2020 0100   LEUKOCYTESUR NEGATIVE 12/31/2020 0100   Sepsis Labs: @LABRCNTIP (procalcitonin:4,lacticidven:4)  ) Recent Results (from the past 240 hours)  MRSA Next Gen by PCR, Nasal     Status: Abnormal   Collection Time: 08/17/23 10:56 PM   Specimen: Nasal Mucosa; Nasal Swab  Result Value Ref Range Status   MRSA by PCR Next Gen DETECTED (A) NOT DETECTED Final    Comment: RESULT CALLED TO, READ BACK BY AND VERIFIED WITH: C KYEI RN 08/18/23 @ 0038 BY AB (NOTE) The GeneXpert MRSA Assay (FDA approved for NASAL specimens only), is one component of a comprehensive MRSA colonization surveillance program. It is not intended to diagnose MRSA infection nor to guide or monitor treatment for MRSA infections. Test performance is not FDA approved in patients less than 93 years old. Performed at [facility], [address] [place], [place] [address]      Radiology Studies: No results found.   Scheduled Meds:  aspirin  EC  81 mg Oral Daily   atorvastatin   40 mg Oral QHS   fluticasone  furoate-vilanterol  1 puff Inhalation Daily   Gerhardt's butt cream   Topical BID   heparin   5,000 Units Subcutaneous Q8H   leptospermum manuka honey  1 Application Topical Daily   mesalamine   1,000 mg Oral BID   midodrine   5 mg Oral BID WC   pantoprazole   40 mg Oral BID   polyethylene glycol  17 g Oral Daily   sodium chloride  flush  3 mL Intravenous Q12H   torsemide   40 mg Oral BID   Continuous Infusions:   LOS: 8 days    Time spent:    Sigurd Pac, MD [facility]   08/25/2023, 10:26 AM

## 2023-08-25 NOTE — Plan of Care (Signed)
   Problem: Education: Goal: Knowledge of General Education information will improve Description Including pain rating scale, medication(s)/side effects and non-pharmacologic comfort measures Outcome: Progressing

## 2023-08-26 DIAGNOSIS — I5033 Acute on chronic diastolic (congestive) heart failure: Secondary | ICD-10-CM | POA: Diagnosis not present

## 2023-08-26 LAB — BASIC METABOLIC PANEL WITH GFR
Anion gap: 5 (ref 5–15)
BUN: 39 mg/dL — ABNORMAL HIGH (ref 8–23)
CO2: 35 mmol/L — ABNORMAL HIGH (ref 22–32)
Calcium: 8 mg/dL — ABNORMAL LOW (ref 8.9–10.3)
Chloride: 93 mmol/L — ABNORMAL LOW (ref 98–111)
Creatinine, Ser: 1.83 mg/dL — ABNORMAL HIGH (ref 0.44–1.00)
GFR, Estimated: 26 mL/min — ABNORMAL LOW (ref >60)
Glucose, Bld: 96 mg/dL (ref 70–99)
Potassium: 3.7 mmol/L (ref 3.5–5.1)
Sodium: 133 mmol/L — ABNORMAL LOW (ref 135–145)

## 2023-08-26 MED ORDER — TORSEMIDE 20 MG PO TABS: ORAL_TABLET | ORAL | Status: DC

## 2023-08-26 NOTE — Progress Notes (Signed)
 Pt requests legs to be unwrapped. RN unwrapped leg dressings. Pt skin is very fragile. Non pitting edema noted in bilateral LE.

## 2023-08-26 NOTE — TOC Transition Note (Signed)
 Transition of Care [facility]) - Discharge Note   Patient Details  Name: Felicia Benson MRN: [id number] Date of Birth: 11/15/1930  [facility]) CM/SW Contact:  Lauraine FORBES Saa, LCSWA Phone Number: 08/26/2023, 11:38 AM   Clinical Narrative:     Patient will DC to: [facility] SN[facility] LTC  Anticipated DC date: 08/26/2023 Family notified: [name]; Daughter; [phone] Transport by: [facility]   Per MD patient ready for DC to [facility] SNF LTC. RN to call report prior to discharge [phone]). RN, patient, patient's family, and facility notified of DC. Discharge Summary and FL2 sent to facility. DC packet on chart. Ambulance transport requested for patient at 10:59.   CSW will sign off for now as social work intervention is no longer needed. Please consult us  again if new needs arise.    Final next level of care: Skilled Nursing Facility Barriers to Discharge: Barriers Resolved   Patient Goals and CMS Choice            Discharge Placement              Patient chooses bed at: [facility] Patient to be transferred to facility by: [facility] Name of family member notified: [name]; Daughter; [phone] Patient and family notified of of transfer: 08/26/23  Discharge Plan and Services Additional resources added to the After Visit Summary for   In-house Referral: Clinical Social Work   Post Acute Care Choice: Skilled Nursing Facility, Nursing Home                               Social Drivers of Health (SDOH) Interventions SDOH Screenings   Food Insecurity: No Food Insecurity (08/17/2023)  Housing: Low Risk  (08/17/2023)  Transportation Needs: No Transportation Needs (08/17/2023)  Utilities: Not At Risk (08/17/2023)  Social Connections: Moderately Integrated (08/17/2023)  Recent Concern: Social Connections - Moderately Isolated (07/24/2023)  Tobacco Use: Medium Risk (08/17/2023)     Readmission Risk Interventions    07/26/2023   11:43 AM 07/24/2022     2:49 PM  Readmission Risk Prevention Plan  Transportation Screening Complete Complete  PCP or Specialist Appt within 5-7 Days  Complete  Home Care Screening  Complete  Medication Review (RN CM)  Complete  Medication Review (RN Care Manager) Complete   PCP or Specialist appointment within 3-5 days of discharge Complete   HRI or Home Care Consult Complete   Palliative Care Screening Not Applicable   Skilled Nursing Facility Not Applicable

## 2023-08-26 NOTE — Discharge Summary (Addendum)
 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 Pulmonary hypertension RV failure Moderate to severe mitral regurgitation Acute on chronic hypoxic respiratory failure on 5 L baseline O2   Essential hypertension   CAD S/P percutaneous coronary angioplasty   Chronic kidney disease, stage 3b (HCC)   COPD (chronic obstructive pulmonary disease) (HCC)   Hyperlipidemia   GERD (gastroesophageal reflux disease)   Crohn's disease (HCC)   OSA (obstructive sleep apnea)   Anemia of chronic disease   History of pulmonary embolism   Obesity, class 1   Discharge Condition: Improved  Diet recommendation: Low sodium, heart healthy  Filed Weights   08/24/23 0654 08/25/23 0418 08/26/23 0609  Weight: 87.3 kg 84.8 kg 85.1 kg    History of present illness:  89 yo female, nursing home resident, with the past medical history of Crohn's disease, COPD, CKD stage 3B, history of DVT and PE sp IVC filter, who presented with dyspnea, lower extremity edema and weight gain. Progressive worsening symptoms over several days, with 3 lbs weight gain in 3 days. EMS was called and patient was brought to the hospital.  On his initial physical examination his blood pressure was 108/77, HR 91, RR 14 and 02 saturation 92% positive bilateral lower extremity edema.  Na 138, K 3,7 Cl 96 bicarbonate 31 glucose 109, bun 51 cr 2,0 , BNP 2,202 , troponin 47 and 49 CXR wcardiomegaly, bilateral hilar vascular congestion, bilateral basal interstitial infiltrates, right basal atelectasis, with small left pleural  effusion. Pacemaker in place with one right artrial and on right ventricular lead.   Hospital Course:   Acute on chronic diastolic heart failure (HCC) Pulm HTN, RV Failure Mod to Severe MR 07/2023 echo w/ EF 45 to 50%, global hypokinesis, severe LVH, mild reduction RV systolic function, moderate to severe mitral valve regurgitation, moderate tricuspid valve regurgitation, - Readmitted with 20 pound weight gain following recent hospitalization 3 weeks ago - Diuresed with IV Lasix , metolazone , clinically improving, down 16 LB  - Switch to oral torsemide , discharged on torsemide  40 mg in a.m. and 20 mg in p.m., advised additional torsemide  for weight gain -needs close follow-up with [facility] -GDMT limited by CKD, hypotension, Poor candidate for SGLT2i - Has been seen by palliative care multiple times in the last few admissions, hospice has been suggested on several occasions, patient continues to refuse hospice -Palliative care follow-up at her facility is due this week   Acute on chronic hypoxemic respiratory failure, due to acute cardiogenic pulmonary edema. -She is on 5 L home O2 at baseline   CAD S/P percutaneous coronary angioplasty No acute coronary syndrome High sensitive troponin elevation due to heart failure.  Continue aspirin  and statin    Chronic kidney disease, stage 3b (HCC) AKI, hypomagnesemia.  - Stable, continue diuretics   COPD (chronic obstructive pulmonary disease) (HCC) No signs of acute exacerbation, continue with bronchodilator therapy    Hyperlipidemia Continue statin    GERD (gastroesophageal reflux disease) Continue pantoprazole     Crohn's disease (HCC) No signs of active flare.  Continue with mesalamine   Follow up as outpatient   OSA (  obstructive sleep apnea) cpap   Anemia of chronic disease Hgb stable at 9,8  Follow up cell count.    History of pulmonary embolism SP IVC filter   Obesity, class 1 Calculated BMI is 32.4   Discharge  Exam: Vitals:   08/26/23 0312 08/26/23 0900  BP: 99/68 111/69  Pulse: 86 81  Resp: 16 18  Temp: 98.3 F (36.8 C) 98 F (36.7 C)  SpO2: 96% 92%   Gen: Awake, Alert, Oriented X 3,  HEENT: no JVD Lungs: Good air movement bilaterally, CTAB CVS: S1S2/RRR Abd: soft, Non tender, non distended, BS present Extremities: No edema Skin: no new rashes on exposed skin   Discharge Instructions   Discharge Instructions     Diet - low sodium heart healthy   Complete by: As directed    Discharge wound care:   Complete by: As directed    routine   Increase activity slowly   Complete by: As directed       Allergies as of 08/26/2023       Reactions   Motrin [ibuprofen] Other (See Comments)   GI bleed   Ace Inhibitors Cough   Breztri  Aerosphere [budeson-glycopyrrol-formoterol ] Hypertension   Cipro [ciprofloxacin Hcl] Other (See Comments)   Avoid fluoroquinolones due to asc-aortic aneurysm   Levaquin [levofloxacin] Other (See Comments)   Avoid fluoroquinolones due to asc-aortic aneurysm   Nsaids Nausea And Vomiting, Other (See Comments)   Hx of GI bleed        Medication List     STOP taking these medications    fluconazole 50 MG tablet Commonly known as: DIFLUCAN       TAKE these medications    acetaminophen  500 MG tablet Commonly known as: TYLENOL  Take 1,000 mg by mouth at bedtime. Give with melatonin   albuterol  108 (90 Base) MCG/ACT inhaler Commonly known as: VENTOLIN  HFA Inhale 2 puffs into the lungs every 6 (six) hours as needed for wheezing or shortness of breath.   aspirin  EC 81 MG tablet Take 1 tablet (81 mg total) by mouth daily. Swallow whole.   atorvastatin  40 MG tablet Commonly known as: LIPITOR Take 40 mg by mouth at bedtime.   barrier cream Crea Commonly known as: non-specified Apply 1 Application topically 2 (two) times daily.   Breo Ellipta  200-25 MCG/ACT Aepb Generic drug: fluticasone  furoate-vilanterol Inhale 1 puff into the lungs  daily.   cetirizine 10 MG tablet Commonly known as: ZYRTEC Take 10 mg by mouth in the morning.   cholecalciferol  25 MCG (1000 UNIT) tablet Commonly known as: VITAMIN D3 Take 1,000 Units by mouth in the morning.   Delsym  30 MG/5ML liquid Generic drug: dextromethorphan  Take 10 mLs by mouth 2 (two) times daily as needed for cough.   diphenhydramine -acetaminophen  25-500 MG Tabs tablet Commonly known as: TYLENOL  PM Take 1 tablet by mouth at bedtime as needed (Insomnia).   docusate sodium  100 MG capsule Commonly known as: COLACE Take 200 mg by mouth in the morning.   lidocaine  4 % Place 1 patch onto the skin 2 (two) times daily as needed (Pain).   liver oil-zinc  oxide 40 % ointment Commonly known as: DESITIN Apply topically 2 (two) times daily.   melatonin 3 MG Tabs tablet Take 9 mg by mouth at bedtime.   midodrine  5 MG tablet Commonly known as: PROAMATINE  Take 1 tablet (5 mg total) by mouth 2 (two) times daily with a meal.   mometasone -formoterol  100-5 MCG/ACT Aero Commonly known as: DULERA  Inhale 2 puffs  into the lungs 2 (two) times daily.   montelukast  10 MG tablet Commonly known as: SINGULAIR  Take 1 tablet (10 mg total) by mouth daily.   ondansetron  4 MG tablet Commonly known as: ZOFRAN  Take 4 mg by mouth every 6 (six) hours as needed for nausea or vomiting.   OXYGEN Inhale 4 L into the lungs continuous. What changed: how much to take   pantoprazole  40 MG tablet Commonly known as: PROTONIX  Take 40 mg by mouth in the morning and at bedtime.   Pentasa  500 MG CR capsule Generic drug: mesalamine  Take 1,000 mg by mouth 2 (two) times daily.   polyethylene glycol 17 g packet Commonly known as: MIRALAX  / GLYCOLAX  Take 17 g by mouth 2 (two) times daily as needed for mild constipation or moderate constipation.   saccharomyces boulardii 250 MG capsule Commonly known as: FLORASTOR Take 250 mg by mouth 2 (two) times daily.   torsemide  20 MG tablet Commonly known  as: DEMADEX  Take 40mg  in am and 20mg  in afternoon, take extra dose of 20mg  for weight gain of 3lb in 1day or 5lb in 1 week What changed:  medication strength how much to take how to take this when to take this additional instructions               Discharge Care Instructions  (From admission, onward)           Start     Ordered   08/26/23 0000  Discharge wound care:       Comments: routine   08/26/23 0930           Allergies  Allergen Reactions   Motrin [Ibuprofen] Other (See Comments)    GI bleed   Ace Inhibitors Cough   Breztri  Aerosphere [Budeson-Glycopyrrol-Formoterol ] Hypertension   Cipro [Ciprofloxacin Hcl] Other (See Comments)    Avoid fluoroquinolones due to asc-aortic aneurysm   Levaquin [Levofloxacin] Other (See Comments)    Avoid fluoroquinolones due to asc-aortic aneurysm   Nsaids Nausea And Vomiting and Other (See Comments)    Hx of GI bleed      The results of significant diagnostics from this hospitalization (including imaging, microbiology, ancillary and laboratory) are listed below for reference.    Significant Diagnostic Studies: DG Chest Portable 1 View Result Date: 08/17/2023 CLINICAL DATA:  Shortness of breath on exertion EXAM: PORTABLE CHEST 1 VIEW COMPARISON:  07/23/2023 FINDINGS: Cardiac shadow is enlarged but stable. Pacing device is again seen. Mild central vascular congestion is noted. Small left effusion with basilar airspace opacity is noted. Mild right basilar scar is seen and stable. IMPRESSION: Small left effusion with basilar airspace opacity. Mild right basilar scarring. Electronically Signed   By: Oneil Devonshire M.D.   On: 08/17/2023 20:22    Microbiology: Recent Results (from the past 240 hours)  MRSA Next Gen by PCR, Nasal     Status: Abnormal   Collection Time: 08/17/23 10:56 PM   Specimen: Nasal Mucosa; Nasal Swab  Result Value Ref Range Status   MRSA by PCR Next Gen DETECTED (A) NOT DETECTED Final    Comment: RESULT  CALLED TO, READ BACK BY AND VERIFIED WITH: C KYEI RN 08/18/23 @ 0038 BY AB (NOTE) The GeneXpert MRSA Assay (FDA approved for NASAL specimens only), is one component of a comprehensive MRSA colonization surveillance program. It is not intended to diagnose MRSA infection nor to guide or monitor treatment for MRSA infections. Test performance is not FDA approved in patients less than 32 years old.  Performed at [facility], [address], [place], [place] [address]      Labs: Basic Metabolic Panel: Recent Labs  Lab 08/20/23 0311 08/21/23 0250 08/22/23 0351 08/23/23 0140 08/24/23 0220 08/25/23 0210 08/26/23 0259  NA 136 138 138 137 135 137 133*  K 3.9 3.4* 3.3* 4.4 4.4 4.9 3.7  CL 95* 95* 96* 95* 90* 90* 93*  CO2 33* 33* 33* 31 34* 37* 35*  GLUCOSE 103* 93 93 97 94 95 96  BUN 41* 36* 33* 35* 34* 35* 39*  CREATININE 1.40* 1.47* 1.23* 1.33* 1.48* 1.59* 1.83*  CALCIUM  8.1* 8.3* 8.3* 8.5* 8.4* 8.7* 8.0*  MG 2.1 2.0  --   --   --   --   --    Liver Function Tests: Recent Labs  Lab 08/23/23 0140  ALBUMIN  2.4*   No results for input(s): LIPASE, AMYLASE in the last 168 hours. No results for input(s): AMMONIA in the last 168 hours. CBC: No results for input(s): WBC, NEUTROABS, HGB, HCT, MCV, PLT in the last 168 hours. Cardiac Enzymes: No results for input(s): CKTOTAL, CKMB, CKMBINDEX, TROPONINI in the last 168 hours. BNP: BNP (last 3 results) Recent Labs    06/25/23 1455 07/23/23 1812 08/17/23 1953  BNP 2,051.6* 1,806.7* 2,202.6*    ProBNP (last 3 results) No results for input(s): PROBNP in the last 8760 hours.  CBG: No results for input(s): GLUCAP in the last 168 hours.     Signed:  Sigurd Pac MD.  [facility] 08/26/2023, 9:58 AM

## 2023-08-26 NOTE — Plan of Care (Signed)
   Problem: Education: Goal: Knowledge of General Education information will improve Description: Including pain rating scale, medication(s)/side effects and non-pharmacologic comfort measures Outcome: Progressing   Problem: Nutrition: Goal: Adequate nutrition will be maintained Outcome: Progressing

## 2023-08-26 NOTE — Progress Notes (Signed)
 Patient being dischanged to [facility] Bed 207A report [phone]  1120 - Attempted to call report to [facility].  No answer on unit.  1135 - [facility] on unit to transfer patient to SNF.  Spoke with Charvina and gave report prior to patient transport.

## 2023-08-26 NOTE — Progress Notes (Incomplete)
 Heart Failure Nurse Navigator Progress Note  PCP: Leonidas PCP-Cardiologist: *** Admission Diagnosis: *** Admitted from: ***  Presentation:   Felicia Benson presented with ***  ECHO/ LVEF: ***  Clinical Course:  Past Medical History:  Diagnosis Date   AAA (abdominal aortic aneurysm) (HCC)    CHF (congestive heart failure) (HCC)    Crohn's disease (HCC)    Diverticulitis    Diverticulosis    GERD (gastroesophageal reflux disease)    GI bleed    Hypertension    OSA (obstructive sleep apnea)    Pacemaker    Thyroid nodule      Social History   Socioeconomic History   Marital status: Widowed    Spouse name: Not on file   Number of children: Not on file   Years of education: Not on file   Highest education level: Not on file  Occupational History   Not on file  Tobacco Use   Smoking status: Former   Smokeless tobacco: Never  Vaping Use   Vaping status: Never Used  Substance and Sexual Activity   Alcohol  use: No    Comment: Quit in 1985   Drug use: No   Sexual activity: Not on file  Other Topics Concern   Not on file  Social History Narrative   Not on file   Social Drivers of Health   Financial Resource Strain: Not on file  Food Insecurity: No Food Insecurity (08/17/2023)   Hunger Vital Sign    Worried About Running Out of Food in the Last Year: Never true    Ran Out of Food in the Last Year: Never true  Transportation Needs: No Transportation Needs (08/17/2023)   PRAPARE - Administrator, Civil Service (Medical): No    Lack of Transportation (Non-Medical): No  Physical Activity: Not on file  Stress: Not on file  Social Connections: Moderately Integrated (08/17/2023)   Social Connection and Isolation Panel    Frequency of Communication with Friends and Family: Three times a week    Frequency of Social Gatherings with Friends and Family: Three times a week    Attends Religious Services: Never    Active Member of Clubs or Organizations: No     Attends Banker Meetings: 1 to 4 times per year    Marital Status: Married  Recent Concern: Social Connections - Moderately Isolated (07/24/2023)   Social Connection and Isolation Panel    Frequency of Communication with Friends and Family: More than three times a week    Frequency of Social Gatherings with Friends and Family: More than three times a week    Attends Religious Services: Never    Database administrator or Organizations: No    Attends Banker Meetings: 1 to 4 times per year    Marital Status: Widowed   Education Assessment and Provision:  Detailed education and instructions provided on heart failure disease management including the following:  Signs and symptoms of Heart Failure When to call the physician Importance of daily weights Low sodium diet Fluid restriction Medication management Anticipated future follow-up appointments  Patient education given on each of the above topics.  Patient acknowledges understanding via teach back method and acceptance of all instructions.  Education Materials:  Living Better With Heart Failure Booklet, HF zone tool, & Daily Weight Tracker Tool.  Patient has scale at home: *** Patient has pill box at home: ***    High Risk Criteria for Readmission and/or Poor Patient Outcomes:  Heart failure hospital admissions (last 6 months): ***  No Show rate: *** Difficult social situation: *** Demonstrates medication adherence: *** Primary Language: *** Literacy level: ***  Barriers of Care:   ***  Considerations/Referrals:   Referral made to Heart Failure Pharmacist Stewardship: *** Referral made to Heart Failure CSW/NCM TOC: *** Referral made to Heart & Vascular TOC clinic: ***  Items for Follow-up on DC/TOC: ***   ***

## 2023-08-29 ENCOUNTER — Ambulatory Visit (HOSPITAL_BASED_OUTPATIENT_CLINIC_OR_DEPARTMENT_OTHER): Admitting: Cardiology

## 2023-09-02 ENCOUNTER — Ambulatory Visit (HOSPITAL_BASED_OUTPATIENT_CLINIC_OR_DEPARTMENT_OTHER): Admitting: Family

## 2023-09-09 ENCOUNTER — Ambulatory Visit (INDEPENDENT_AMBULATORY_CARE_PROVIDER_SITE_OTHER)

## 2023-09-09 DIAGNOSIS — I5042 Chronic combined systolic (congestive) and diastolic (congestive) heart failure: Secondary | ICD-10-CM | POA: Diagnosis not present

## 2023-09-11 LAB — CUP PACEART REMOTE DEVICE CHECK
Battery Remaining Longevity: 125 mo
Battery Voltage: 3.03 V
Brady Statistic AP VP Percent: 6.35 %
Brady Statistic AP VS Percent: 0 %
Brady Statistic AS VP Percent: 93.58 %
Brady Statistic AS VS Percent: 0.07 %
Brady Statistic RA Percent Paced: 6.29 %
Brady Statistic RV Percent Paced: 99.93 %
Date Time Interrogation Session: 20250901060610
Implantable Lead Connection Status: 753985
Implantable Lead Connection Status: 753985
Implantable Lead Implant Date: 20150818
Implantable Lead Implant Date: 20150818
Implantable Lead Location: 753859
Implantable Lead Location: 753860
Implantable Lead Model: 5076
Implantable Lead Model: 5076
Implantable Pulse Generator Implant Date: 20240703
Lead Channel Impedance Value: 247 Ohm
Lead Channel Impedance Value: 285 Ohm
Lead Channel Impedance Value: 304 Ohm
Lead Channel Impedance Value: 380 Ohm
Lead Channel Pacing Threshold Amplitude: 0.625 V
Lead Channel Pacing Threshold Amplitude: 0.875 V
Lead Channel Pacing Threshold Pulse Width: 0.4 ms
Lead Channel Pacing Threshold Pulse Width: 0.4 ms
Lead Channel Sensing Intrinsic Amplitude: 2.25 mV
Lead Channel Sensing Intrinsic Amplitude: 2.25 mV
Lead Channel Sensing Intrinsic Amplitude: 7 mV
Lead Channel Sensing Intrinsic Amplitude: 7 mV
Lead Channel Setting Pacing Amplitude: 1.5 V
Lead Channel Setting Pacing Amplitude: 2 V
Lead Channel Setting Pacing Pulse Width: 0.4 ms
Lead Channel Setting Sensing Sensitivity: 1.2 mV
Zone Setting Status: 755011

## 2023-09-12 ENCOUNTER — Ambulatory Visit: Payer: Self-pay | Admitting: Cardiology

## 2023-09-14 ENCOUNTER — Emergency Department (HOSPITAL_COMMUNITY)

## 2023-09-14 ENCOUNTER — Other Ambulatory Visit: Payer: Self-pay

## 2023-09-14 ENCOUNTER — Encounter (HOSPITAL_COMMUNITY): Payer: Self-pay

## 2023-09-14 ENCOUNTER — Inpatient Hospital Stay (HOSPITAL_COMMUNITY)
Admission: EM | Admit: 2023-09-14 | Discharge: 2023-09-22 | DRG: 291 | Disposition: A | Discharge status: Skilled Nursing Facility | Source: Skilled Nursing Facility | Attending: Internal Medicine | Admitting: Internal Medicine

## 2023-09-14 DIAGNOSIS — J449 Chronic obstructive pulmonary disease, unspecified: Secondary | ICD-10-CM | POA: Diagnosis present

## 2023-09-14 DIAGNOSIS — Z9981 Dependence on supplemental oxygen: Secondary | ICD-10-CM

## 2023-09-14 DIAGNOSIS — I272 Pulmonary hypertension, unspecified: Secondary | ICD-10-CM | POA: Diagnosis not present

## 2023-09-14 DIAGNOSIS — J189 Pneumonia, unspecified organism: Secondary | ICD-10-CM | POA: Diagnosis present

## 2023-09-14 DIAGNOSIS — Z95828 Presence of other vascular implants and grafts: Secondary | ICD-10-CM

## 2023-09-14 DIAGNOSIS — I959 Hypotension, unspecified: Secondary | ICD-10-CM | POA: Diagnosis not present

## 2023-09-14 DIAGNOSIS — Z9049 Acquired absence of other specified parts of digestive tract: Secondary | ICD-10-CM

## 2023-09-14 DIAGNOSIS — R7989 Other specified abnormal findings of blood chemistry: Secondary | ICD-10-CM

## 2023-09-14 DIAGNOSIS — N1832 Chronic kidney disease, stage 3b: Secondary | ICD-10-CM | POA: Diagnosis present

## 2023-09-14 DIAGNOSIS — Z955 Presence of coronary angioplasty implant and graft: Secondary | ICD-10-CM

## 2023-09-14 DIAGNOSIS — I10 Essential (primary) hypertension: Secondary | ICD-10-CM | POA: Diagnosis not present

## 2023-09-14 DIAGNOSIS — I509 Heart failure, unspecified: Secondary | ICD-10-CM

## 2023-09-14 DIAGNOSIS — I5043 Acute on chronic combined systolic (congestive) and diastolic (congestive) heart failure: Secondary | ICD-10-CM | POA: Diagnosis present

## 2023-09-14 DIAGNOSIS — I442 Atrioventricular block, complete: Secondary | ICD-10-CM | POA: Diagnosis present

## 2023-09-14 DIAGNOSIS — K5090 Crohn's disease, unspecified, without complications: Secondary | ICD-10-CM | POA: Diagnosis present

## 2023-09-14 DIAGNOSIS — I2781 Cor pulmonale (chronic): Secondary | ICD-10-CM | POA: Diagnosis present

## 2023-09-14 DIAGNOSIS — I081 Rheumatic disorders of both mitral and tricuspid valves: Secondary | ICD-10-CM | POA: Diagnosis present

## 2023-09-14 DIAGNOSIS — Z87891 Personal history of nicotine dependence: Secondary | ICD-10-CM

## 2023-09-14 DIAGNOSIS — Z950 Presence of cardiac pacemaker: Secondary | ICD-10-CM

## 2023-09-14 DIAGNOSIS — I2729 Other secondary pulmonary hypertension: Secondary | ICD-10-CM | POA: Diagnosis present

## 2023-09-14 DIAGNOSIS — R627 Adult failure to thrive: Secondary | ICD-10-CM | POA: Diagnosis present

## 2023-09-14 DIAGNOSIS — I340 Nonrheumatic mitral (valve) insufficiency: Secondary | ICD-10-CM | POA: Diagnosis not present

## 2023-09-14 DIAGNOSIS — L89153 Pressure ulcer of sacral region, stage 3: Secondary | ICD-10-CM | POA: Diagnosis present

## 2023-09-14 DIAGNOSIS — K219 Gastro-esophageal reflux disease without esophagitis: Secondary | ICD-10-CM | POA: Diagnosis not present

## 2023-09-14 DIAGNOSIS — Z66 Do not resuscitate: Secondary | ICD-10-CM | POA: Diagnosis present

## 2023-09-14 DIAGNOSIS — Z22322 Carrier or suspected carrier of Methicillin resistant Staphylococcus aureus: Secondary | ICD-10-CM

## 2023-09-14 DIAGNOSIS — I2510 Atherosclerotic heart disease of native coronary artery without angina pectoris: Secondary | ICD-10-CM | POA: Diagnosis not present

## 2023-09-14 DIAGNOSIS — E66811 Obesity, class 1: Secondary | ICD-10-CM | POA: Diagnosis present

## 2023-09-14 DIAGNOSIS — H3530 Unspecified macular degeneration: Secondary | ICD-10-CM | POA: Diagnosis present

## 2023-09-14 DIAGNOSIS — H353190 Nonexudative age-related macular degeneration, unspecified eye, stage unspecified: Secondary | ICD-10-CM | POA: Diagnosis not present

## 2023-09-14 DIAGNOSIS — E782 Mixed hyperlipidemia: Secondary | ICD-10-CM | POA: Diagnosis not present

## 2023-09-14 DIAGNOSIS — I5A Non-ischemic myocardial injury (non-traumatic): Secondary | ICD-10-CM | POA: Diagnosis present

## 2023-09-14 DIAGNOSIS — Z888 Allergy status to other drugs, medicaments and biological substances status: Secondary | ICD-10-CM

## 2023-09-14 DIAGNOSIS — J440 Chronic obstructive pulmonary disease with acute lower respiratory infection: Secondary | ICD-10-CM | POA: Diagnosis present

## 2023-09-14 DIAGNOSIS — G4733 Obstructive sleep apnea (adult) (pediatric): Secondary | ICD-10-CM | POA: Diagnosis present

## 2023-09-14 DIAGNOSIS — D638 Anemia in other chronic diseases classified elsewhere: Secondary | ICD-10-CM | POA: Diagnosis present

## 2023-09-14 DIAGNOSIS — I714 Abdominal aortic aneurysm, without rupture, unspecified: Secondary | ICD-10-CM | POA: Diagnosis present

## 2023-09-14 DIAGNOSIS — I5033 Acute on chronic diastolic (congestive) heart failure: Secondary | ICD-10-CM | POA: Diagnosis not present

## 2023-09-14 DIAGNOSIS — Z7982 Long term (current) use of aspirin: Secondary | ICD-10-CM

## 2023-09-14 DIAGNOSIS — K50819 Crohn's disease of both small and large intestine with unspecified complications: Secondary | ICD-10-CM | POA: Diagnosis not present

## 2023-09-14 DIAGNOSIS — E871 Hypo-osmolality and hyponatremia: Secondary | ICD-10-CM | POA: Diagnosis present

## 2023-09-14 DIAGNOSIS — Z7951 Long term (current) use of inhaled steroids: Secondary | ICD-10-CM

## 2023-09-14 DIAGNOSIS — Z9861 Coronary angioplasty status: Secondary | ICD-10-CM | POA: Diagnosis not present

## 2023-09-14 DIAGNOSIS — E785 Hyperlipidemia, unspecified: Secondary | ICD-10-CM | POA: Diagnosis present

## 2023-09-14 DIAGNOSIS — R042 Hemoptysis: Secondary | ICD-10-CM | POA: Diagnosis not present

## 2023-09-14 DIAGNOSIS — E8809 Other disorders of plasma-protein metabolism, not elsewhere classified: Secondary | ICD-10-CM | POA: Diagnosis present

## 2023-09-14 DIAGNOSIS — J9621 Acute and chronic respiratory failure with hypoxia: Secondary | ICD-10-CM | POA: Diagnosis present

## 2023-09-14 DIAGNOSIS — Z7189 Other specified counseling: Secondary | ICD-10-CM | POA: Diagnosis not present

## 2023-09-14 DIAGNOSIS — Z6832 Body mass index (BMI) 32.0-32.9, adult: Secondary | ICD-10-CM

## 2023-09-14 DIAGNOSIS — Z79899 Other long term (current) drug therapy: Secondary | ICD-10-CM

## 2023-09-14 DIAGNOSIS — Z86718 Personal history of other venous thrombosis and embolism: Secondary | ICD-10-CM

## 2023-09-14 DIAGNOSIS — L8990 Pressure ulcer of unspecified site, unspecified stage: Secondary | ICD-10-CM | POA: Diagnosis present

## 2023-09-14 DIAGNOSIS — J439 Emphysema, unspecified: Secondary | ICD-10-CM | POA: Diagnosis not present

## 2023-09-14 DIAGNOSIS — Z515 Encounter for palliative care: Secondary | ICD-10-CM | POA: Diagnosis not present

## 2023-09-14 DIAGNOSIS — I130 Hypertensive heart and chronic kidney disease with heart failure and stage 1 through stage 4 chronic kidney disease, or unspecified chronic kidney disease: Secondary | ICD-10-CM | POA: Diagnosis present

## 2023-09-14 DIAGNOSIS — R5381 Other malaise: Secondary | ICD-10-CM | POA: Diagnosis present

## 2023-09-14 DIAGNOSIS — Z86711 Personal history of pulmonary embolism: Secondary | ICD-10-CM

## 2023-09-14 DIAGNOSIS — I5023 Acute on chronic systolic (congestive) heart failure: Secondary | ICD-10-CM | POA: Diagnosis not present

## 2023-09-14 DIAGNOSIS — E876 Hypokalemia: Secondary | ICD-10-CM | POA: Diagnosis present

## 2023-09-14 DIAGNOSIS — I5082 Biventricular heart failure: Secondary | ICD-10-CM | POA: Diagnosis present

## 2023-09-14 DIAGNOSIS — Z881 Allergy status to other antibiotic agents status: Secondary | ICD-10-CM

## 2023-09-14 DIAGNOSIS — Z886 Allergy status to analgesic agent status: Secondary | ICD-10-CM

## 2023-09-14 DIAGNOSIS — N179 Acute kidney failure, unspecified: Secondary | ICD-10-CM | POA: Diagnosis present

## 2023-09-14 LAB — CBC WITH DIFFERENTIAL/PLATELET
Abs Immature Granulocytes: 0.03 K/uL (ref 0.00–0.07)
Basophils Absolute: 0 K/uL (ref 0.0–0.1)
Basophils Relative: 0 %
Eosinophils Absolute: 0 K/uL (ref 0.0–0.5)
Eosinophils Relative: 0 %
HCT: 31.4 % — ABNORMAL LOW (ref 36.0–46.0)
Hemoglobin: 9.7 g/dL — ABNORMAL LOW (ref 12.0–15.0)
Immature Granulocytes: 0 %
Lymphocytes Relative: 10 %
Lymphs Abs: 0.8 K/uL (ref 0.7–4.0)
MCH: 28.2 pg (ref 26.0–34.0)
MCHC: 30.9 g/dL (ref 30.0–36.0)
MCV: 91.3 fL (ref 80.0–100.0)
Monocytes Absolute: 0.9 K/uL (ref 0.1–1.0)
Monocytes Relative: 12 %
Neutro Abs: 5.8 K/uL (ref 1.7–7.7)
Neutrophils Relative %: 78 %
Platelets: 228 K/uL (ref 150–400)
RBC: 3.44 MIL/uL — ABNORMAL LOW (ref 3.87–5.11)
RDW: 17.9 % — ABNORMAL HIGH (ref 11.5–15.5)
WBC: 7.6 K/uL (ref 4.0–10.5)
nRBC: 0 % (ref 0.0–0.2)

## 2023-09-14 LAB — COMPREHENSIVE METABOLIC PANEL WITH GFR
ALT: 21 U/L (ref 0–44)
AST: 27 U/L (ref 15–41)
Albumin: 2.6 g/dL — ABNORMAL LOW (ref 3.5–5.0)
Alkaline Phosphatase: 83 U/L (ref 38–126)
Anion gap: 14 (ref 5–15)
BUN: 68 mg/dL — ABNORMAL HIGH (ref 8–23)
CO2: 35 mmol/L — ABNORMAL HIGH (ref 22–32)
Calcium: 8.4 mg/dL — ABNORMAL LOW (ref 8.9–10.3)
Chloride: 89 mmol/L — ABNORMAL LOW (ref 98–111)
Creatinine, Ser: 2.16 mg/dL — ABNORMAL HIGH (ref 0.44–1.00)
GFR, Estimated: 21 mL/min — ABNORMAL LOW (ref >60)
Glucose, Bld: 115 mg/dL — ABNORMAL HIGH (ref 70–99)
Potassium: 3.3 mmol/L — ABNORMAL LOW (ref 3.5–5.1)
Sodium: 138 mmol/L (ref 135–145)
Total Bilirubin: 0.7 mg/dL (ref 0.0–1.2)
Total Protein: 6.5 g/dL (ref 6.5–8.1)

## 2023-09-14 LAB — BRAIN NATRIURETIC PEPTIDE: B Natriuretic Peptide: 2920.5 pg/mL — ABNORMAL HIGH (ref 0.0–100.0)

## 2023-09-14 LAB — TROPONIN I (HIGH SENSITIVITY): Troponin I (High Sensitivity): 63 ng/L — ABNORMAL HIGH (ref <18)

## 2023-09-14 MED ORDER — FUROSEMIDE 10 MG/ML IJ SOLN
80.0000 mg | Freq: Two times a day (BID) | INTRAMUSCULAR | Status: DC
  Administered 2023-09-15 – 2023-09-16 (×4): 80 mg via INTRAVENOUS
  Filled 2023-09-14 (×4): qty 8

## 2023-09-14 MED ORDER — ONDANSETRON 4 MG PO TBDP
4.0000 mg | ORAL_TABLET | Freq: Once | ORAL | Status: AC
  Administered 2023-09-14: 4 mg via ORAL
  Filled 2023-09-14: qty 1

## 2023-09-14 MED ORDER — POTASSIUM CHLORIDE 20 MEQ PO PACK
40.0000 meq | PACK | Freq: Once | ORAL | Status: AC
  Administered 2023-09-14: 40 meq via ORAL
  Filled 2023-09-14: qty 2

## 2023-09-14 MED ORDER — MELATONIN 3 MG PO TABS: 6.0000 mg | ORAL_TABLET | Freq: Every evening | ORAL | Status: DC | PRN

## 2023-09-14 MED ORDER — ACETAMINOPHEN 500 MG PO TABS: 1000.0000 mg | ORAL_TABLET | Freq: Four times a day (QID) | ORAL | Status: DC | PRN

## 2023-09-14 MED ORDER — POLYETHYLENE GLYCOL 3350 17 G PO PACK: 17.0000 g | PACK | Freq: Every day | ORAL | Status: DC | PRN

## 2023-09-14 MED ORDER — ALBUTEROL SULFATE (2.5 MG/3ML) 0.083% IN NEBU: 2.5000 mg | INHALATION_SOLUTION | RESPIRATORY_TRACT | Status: DC | PRN

## 2023-09-14 MED ORDER — SODIUM CHLORIDE 0.9% FLUSH
3.0000 mL | Freq: Two times a day (BID) | INTRAVENOUS | Status: DC
  Administered 2023-09-14 – 2023-09-22 (×16): 3 mL via INTRAVENOUS

## 2023-09-14 MED ORDER — FUROSEMIDE 10 MG/ML IJ SOLN
80.0000 mg | Freq: Once | INTRAMUSCULAR | Status: AC
  Administered 2023-09-14: 80 mg via INTRAVENOUS
  Filled 2023-09-14: qty 8

## 2023-09-14 MED ORDER — ONDANSETRON HCL 4 MG/2ML IJ SOLN
4.0000 mg | Freq: Four times a day (QID) | INTRAMUSCULAR | Status: DC | PRN
  Administered 2023-09-15 – 2023-09-22 (×6): 4 mg via INTRAVENOUS
  Filled 2023-09-14 (×6): qty 2

## 2023-09-14 NOTE — H&P (Signed)
 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, with hx of heart failure with LV mr ejection fraction, RV failure (Dry weight ~ 182 lb = 82 kg), PHTN, requiring midodrine  for BP support, mod-severe MR, Mod TR, CAD, PCI to RCA, heart block s/p pacemaker, aortic aneurysm, COPD with chronic hypoxic respiratory failure on 4 L O2, DVT/PE with IVC filter, not a candidate for anticoagulation due to history of GI bleeding., CKD stage IIIa, Crohn's, anemia of chronic disease , and recurrent admissions for heart failure exacerbation, 6 admits this year so far, which appear to be escalating in frequency; last 8/9 - 8/18 and she was discharged on Torsemide  40 / 20 mg and prn for weight gain. Reports that since being back at her LTC her weight has fluctuated drastically, sometimes as much as 10 lb gain in 1 day. Her weight this morning was 192 lb. Reports that she has received prn doses of Torsemide  20 mg, which are given in the morning (so 60 mg AM / 20 mg PM) on days where she triggers prn dosing, but says has not been very effective. Has received Metolazone  twice within the past week. They weigh her in her wheelchair, which they have weighed separately and subtract, she says this method has been reliable in terms of trends there. For symptoms, has mainly noted edema which is more on her left side (currently laying with L side slightly down). No significant SOB. No chest pain. Denies other medication changes. Restates that she is not interested in hospice because she wants more care than they can provide. She is actually interested in SNF if she is a candidate.      Review of Systems:  ROS complete and negative except as marked above   Allergies  Allergen Reactions   Motrin [Ibuprofen]  Other (See Comments)    GI bleed   Ace Inhibitors Cough   Breztri  Aerosphere [Budeson-Glycopyrrol-Formoterol ] Hypertension   Cipro [Ciprofloxacin Hcl] Other (See Comments)    Avoid fluoroquinolones due to asc-aortic aneurysm   Levaquin [Levofloxacin] Other (See Comments)    Avoid fluoroquinolones due to asc-aortic aneurysm   Nsaids Nausea And Vomiting and Other (See Comments)    Hx of GI bleed    Prior to Admission medications   Medication Sig Start Date End Date Taking? Authorizing Provider  acetaminophen  (TYLENOL ) 500 MG tablet Take 1,000 mg by mouth at bedtime. Give with melatonin    [provider]  albuterol  (VENTOLIN  HFA) 108 (90 Base) MCG/ACT inhaler Inhale 2 puffs into the lungs every 6 (six) hours as needed for wheezing or shortness of breath.    [provider]  aspirin  EC 81 MG tablet Take 1 tablet (81 mg total) by mouth daily. Swallow whole. 03/29/21   Rosemarie Eather RAMAN, MD  atorvastatin  (LIPITOR) 40 MG tablet Take 40 mg by mouth at bedtime.    [provider]  barrier cream (NON-SPECIFIED) CREA Apply 1 Application topically 2 (two) times daily.    [provider]  BREO ELLIPTA  200-25 MCG/ACT AEPB Inhale 1 puff into the lungs daily. 08/02/23   [provider]  cetirizine  (ZYRTEC ) 10 MG tablet Take 10 mg by mouth in the morning.    [provider]  cholecalciferol  (VITAMIN  D3) 25 MCG (1000 UNIT) tablet Take 1,000 Units by mouth in the morning.    [provider]  dextromethorphan  (DELSYM ) 30 MG/5ML liquid Take 10 mLs by mouth 2 (two) times daily as needed for cough.    [provider]  diphenhydramine -acetaminophen  (TYLENOL  PM) 25-500 MG TABS tablet Take 1 tablet by mouth at bedtime as needed (Insomnia).    [provider]  docusate sodium  (COLACE) 100 MG capsule Take 200 mg by mouth in the morning.    [provider]  lidocaine  4 % Place 1 patch onto the skin 2 (two) times daily as needed  (Pain).    [provider]  liver oil-zinc  oxide (DESITIN) 40 % ointment Apply topically 2 (two) times daily. 07/01/23   Samtani, Jai-Gurmukh, MD  melatonin 3 MG TABS tablet Take 9 mg by mouth at bedtime.    [provider]  midodrine  (PROAMATINE ) 5 MG tablet Take 1 tablet (5 mg total) by mouth 2 (two) times daily with a meal. 07/29/23   Fairy Frames, MD  montelukast  (SINGULAIR ) 10 MG tablet Take 1 tablet (10 mg total) by mouth daily. 08/01/21   Neda Jennet LABOR, MD  ondansetron  (ZOFRAN ) 4 MG tablet Take 4 mg by mouth every 6 (six) hours as needed for nausea or vomiting.    [provider]  OXYGEN Inhale 4 L into the lungs continuous. Patient taking differently: Inhale 5 L into the lungs continuous.    [provider]  pantoprazole  (PROTONIX ) 40 MG tablet Take 40 mg by mouth in the morning and at bedtime.    [provider]  PENTASA  500 MG CR capsule Take 1,000 mg by mouth 2 (two) times daily.    [provider]  polyethylene glycol (MIRALAX  / GLYCOLAX ) 17 g packet Take 17 g by mouth 2 (two) times daily as needed for mild constipation or moderate constipation.    [provider]  saccharomyces boulardii (FLORASTOR) 250 MG capsule Take 250 mg by mouth 2 (two) times daily.    [provider]  torsemide  (DEMADEX ) 20 MG tablet Take 40mg  in am and 20mg  in afternoon, take extra dose of 20mg  for weight gain of 3lb in 1day or 5lb in 1 week 08/26/23   Fairy Frames, MD    Past Medical History:  Diagnosis Date   AAA (abdominal aortic aneurysm) (HCC)    CHF (congestive heart failure) (HCC)    Crohn's disease (HCC)    Diverticulitis    Diverticulosis    GERD (gastroesophageal reflux disease)    GI bleed    Hypertension    OSA (obstructive sleep apnea)    Pacemaker    Thyroid nodule     Past Surgical History:  Procedure Laterality Date   CHOLECYSTECTOMY     COLONOSCOPY WITH PROPOFOL  N/A 06/01/2015   Procedure: COLONOSCOPY  WITH PROPOFOL ;  Surgeon: Jerrell Sol, MD;  Location: [facility] ENDOSCOPY;  Service: Endoscopy;  Laterality: N/A;   CORONARY ANGIOPLASTY WITH STENT PLACEMENT     ESOPHAGOGASTRODUODENOSCOPY (EGD) WITH PROPOFOL  N/A 06/01/2015   Procedure: ESOPHAGOGASTRODUODENOSCOPY (EGD) WITH PROPOFOL ;  Surgeon: Jerrell Sol, MD;  Location: [facility] ENDOSCOPY;  Service: Endoscopy;  Laterality: N/A;   GIVENS CAPSULE STUDY N/A 06/03/2015   Procedure: GIVENS CAPSULE STUDY;  Surgeon: Jerrell Sol, MD;  Location: [facility] ENDOSCOPY;  Service: Endoscopy;  Laterality: N/A;   INTRAMEDULLARY (IM) NAIL INTERTROCHANTERIC Right 10/30/2022   Procedure: INTRAMEDULLARY nailing of right femur;  Surgeon: Edna Toribio LABOR, MD;  Location: [facility] OR;  Service: Orthopedics;  Laterality: Right;   IR IVC FILTER PLMT / S&I /IMG GUID/MOD SED  02/11/2023   PACEMAKER PLACEMENT  2015     reports that she has quit smoking. She has never used smokeless tobacco. She reports that she does not drink alcohol  and does not use drugs.  Family History  Problem Relation Age of Onset   CVA Mother    Lung cancer Father    Colon cancer Neg Hx      Physical Exam: Vitals:   09/14/23 2000 09/14/23 2015 09/14/23 2030 09/14/23 2235  BP: 109/78 115/74 99/63 96/77   Pulse: (!) 102 96 92 (!) 102  Resp: 17 (!) 25 20 (!) 21  Temp:      TempSrc:      SpO2: 90% 92% 93% (!) 89%    Gen: Awake, alert, chronically ill appearing.   CV: Regular, normal S1, S2, no murmurs  Resp: Normal WOB, rales in the bases  Abd: Flat, normoactive, nontender MSK: Symmetric, there is 2-3 pitting edema in a dependent fashion, edema is worse on the L side, suspect because keeps this slightly dependent, there is actually bullae of fluid in the subQ in the L arm.  Skin: Scattered ecchymoses, see MSK for more findings.  Neuro: Alert and interactive  Psych: euthymic, appropriate, very good insight    Data review:   Labs reviewed, notable for:   Personally reviewed  K 3.3   Bicarb 35, AG 14  BUN 68 up, Cr 2.16 (b/l ~ 1.2 - 1.5)  BNP 2920 highest on recent record  HS trop 63, with chronic myocardial injury   Micro:  Results for orders placed or performed during the hospital encounter of 08/17/23  MRSA Next Gen by PCR, Nasal     Status: Abnormal   Collection Time: 08/17/23 10:56 PM   Specimen: Nasal Mucosa; Nasal Swab  Result Value Ref Range Status   MRSA by PCR Next Gen DETECTED (A) NOT DETECTED Final    Comment: RESULT CALLED TO, READ BACK BY AND VERIFIED WITH: C KYEI RN 08/18/23 @ 0038 BY AB (NOTE) The GeneXpert MRSA Assay (FDA approved for NASAL specimens only), is one component of a comprehensive MRSA colonization surveillance program. It is not intended to diagnose MRSA infection nor to guide or monitor treatment for MRSA infections. Test performance is not FDA approved in patients less than 60 years old. Performed at [facility], [address] [place], [place] [address]     Imaging reviewed:  DG Chest Portable 1 View Result Date: 09/14/2023 CLINICAL DATA:  Shortness of breath, CHF EXAM: PORTABLE CHEST 1 VIEW COMPARISON:  08/17/2023 FINDINGS: Single frontal view of the chest demonstrates an enlarged cardiac silhouette, stable. Dual lead pacemaker is unchanged. There is increased central pulmonary vascular congestion, basilar predominant interstitial and ground-glass opacities compatible with edema. Small bilateral pleural effusions, left greater than right. No pneumothorax. IMPRESSION: 1. Moderate congestive heart failure, with worsening volume status since prior exam. Electronically Signed   By: Ozell Daring M.D.   On: 09/14/2023 20:15    EKG:  Personally reviewed SR with 1st deg AVB, RBBB, no acute ischemic changes, likely repolarization change with discordant ST changes. Prolonged Qtc not corrected for BBB    ED Course:  Treated with Lasix  80 IV, Kcl 40, zofran .    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, with hx of  heart failure with LV mr ejection fraction, RV failure (Dry weight ~ 182 lb = 82 kg), PHTN, requiring  midodrine  for BP support, mod-severe MR, Mod TR, CAD, PCI to RCA, heart block s/p pacemaker, aortic aneurysm, COPD with chronic hypoxic respiratory failure on 4 L O2, DVT/PE with IVC filter, not a candidate for anticoagulation due to history of GI bleeding., CKD stage IIIa, Crohn's, anemia of chronic disease , and recurrent admissions for heart failure exacerbation, 6 admits this year so far, which appear to be escalating in frequency; last 8/9 - 8/18 and she was discharged on Torsemide  40 / 20 mg and prn for weight gain. Presents with worsening edema and weight gain up to 192 lb despite prn dosing    Acute exacerbation of chronic Heart failure, LV mrEF, RV failure  P/w edema / weight gain up to 192. Dry weight is ~ 182 lb = 82 kg. Reports prn dosing has been ineffective. On home O2. BNP up to 2920. HS trop chronically elevated, see below. CXR with vascular congestion, basilar interstitial change c/w pulm edema, small effusions; worse appearance from prior. Last echo was in 7/'25 with LVEF 45-50%, severe asymmetric septal hypertrophy, + flattening of IV septum c/w RV overload, mild reduced RV systolic function and mod enlarged RV, mod-severe MR, mod TR,  -- Routine Cardiology consult with repeated HF exacerbations, appreciate assistance  - S/p Lasix  80 mg IV in the ED.  Tentatively scheduled for 80 IV BID, adjust as needed. -- Anticipate will need full day at least of trialing PO meds once more euvolemic to ensure I/O remain even  -- Would consider adding scheduled Metolazone  ? Three times per week to her regimen, and likely increase Torsemide  to 40 / 40  - Hold on repeat echo as this was done recently in 7/'25.  - GDMT limited by hypotension  - Heart failure navigator/TOC consult - Low-sodium diet, I/O, daily weights.  -- Palliative involved on previous admission, and patient has declined hospice and  restates this on admission.  -- PT / OT evaluation   Chronic myocardial injury  HS trop elevated since 1/'25, currently HS trop 63 slightly higher from prior. EKG without overt ischemic changes. This likely reflects her decompensated heart failure with ongoing demand  -- Management directed at HF per above   Acute kidney injury stage I  CKD stage IIIb Her renal function has been variable, baseline appears 1.2 - 1.5; although recently has been ~ 1.8. On admission is 2.16 with prerenal azotemia BUN up to 68. Suspect this is related to her RV failure and cardiorenal injury.  -- Continue diuresis per above  -- Check PVR, UA   Chronic medical problems:  Hx PHTN: Noted  Hypotension: Continue home midodrine  for pressure support  Mod-severe MR, mod TR: likely contributing to HF status; poor forward flow  CAD with hx PCI to RCA: Continue aspirin , statin.  Hx HB with PPM: Noted  COPD with CHRF on 4 L O2: Without acute exacerbation, duoneb prn, continue home Dulera , singulair   History DVT/PE, IVC filter: Not candidate for anticoagulation due to history of GI bleeding Crohn's disease: Noted, continue home mesalamine   Anemia of chronic disease: Noted, stable  GERD: Continue home PPI.  Chronic sacral wound: Wound care consult, ordered wound care per prior recs, + pressure redistribution bed, + turn q2 hr  Cleanse wound to sacrum with NS and pat dry. Apply medihoney to wound bed. COver with gauze and silicone sacral foam. Low air loss bed due to potential deep tissue injury surrounding open wound.   There is no height or weight on file to calculate  BMI.    DVT prophylaxis: SCD  Code Status:  DNR/DNI(Do NOT Intubate); confirmed with patient  Diet:  Diet Orders (From admission, onward)    None      Family Communication:  None   Consults:  Cardiology   Admission status:   Inpatient, Telemetry bed  Severity of Illness: The appropriate patient status for this patient is INPATIENT. Inpatient  status is judged to be reasonable and necessary in order to provide the required intensity of service to ensure the patient's safety. The patient's presenting symptoms, physical exam findings, and initial radiographic and laboratory data in the context of their chronic comorbidities is felt to place them at high risk for further clinical deterioration. Furthermore, it is not anticipated that the patient will be medically stable for discharge from the hospital within 2 midnights of admission.   * I certify that at the point of admission it is my clinical judgment that the patient will require inpatient hospital care spanning beyond 2 midnights from the point of admission due to high intensity of service, high risk for further deterioration and high frequency of surveillance required.*   Dorn Dawson, MD [facility]  How to contact the [facility] Attending or Consulting provider 7A - 7P or covering provider during after hours 7P -7A, for this patient.  Check the care team in [facility] and look for a) attending/consulting [facility] provider listed and b) the [facility] team listed Log into [email or web address] and use [facility] universal password to access. If you do not have the password, please contact the hospital operator. Locate the [facility] provider you are looking for under [facility] and page to a number that you can be directly reached. If you still have difficulty reaching the provider, please page the [facility] (Director on Call) for the Hospitalists listed on amion for assistance.  09/14/2023, 10:54 PM

## 2023-09-14 NOTE — H&P (Incomplete)
 History and Physical    Felicia Benson FMW:[id number] DOB: [birth year] DOA: 09/14/2023  PCP: Leon[facility]   Patient coming from: Home   Chief Complaint:  Chief Complaint  Patient presents with  . 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, with hx of heart failure with LV mr ejection fraction, RV failure (Dry weight ~ 182 lb = 82 kg), PHTN, requiring midodrine  for BP support, mod-severe MR, Mod TR, CAD, PCI to RCA, heart block s/p pacemaker, aortic aneurysm, COPD with chronic hypoxic respiratory failure on 4 L O2, DVT/PE with IVC filter, not a candidate for anticoagulation due to history of GI bleeding., CKD stage IIIa, Crohn's, anemia of chronic disease , and recurrent admissions for heart failure exacerbation, 6 admits this year so far, which appear to be escalating in frequency; last 8/9 - 8/18 and she was discharged on Torsemide  40 / 20 mg and prn for weight gain. Reports that since being back at her LTC her weight has fluctuated drastically, sometimes as much as 10 lb gain in 1 day. Her weight this morning was 192 lb. Reports that she has received prn doses of Torsemide  20 mg, which are given in the morning (so 60 mg AM / 20 mg PM) on days where she triggers prn dosing, but says has not been very effective. Has received Metolazone  twice within the past week. They weigh her in her wheelchair, which they have weighed separately and subtract, she says this method has been reliable in terms of trends there. For symptoms, has mainly noted edema which is more on her left side (currently laying with L side slightly down). No significant SOB. No chest pain. Denies other medication changes. Restates that she is not interested in hospice because she wants more care than they can provide. She is actually interested in SNF if she is a candidate.      Review of Systems:  ROS complete and negative except as marked above   Allergies  Allergen Reactions  . Motrin [Ibuprofen]  Other (See Comments)    GI bleed  . Ace Inhibitors Cough  . Breztri  Aerosphere [Budeson-Glycopyrrol-Formoterol ] Hypertension  . Cipro [Ciprofloxacin Hcl] Other (See Comments)    Avoid fluoroquinolones due to asc-aortic aneurysm  . Levaquin [Levofloxacin] Other (See Comments)    Avoid fluoroquinolones due to asc-aortic aneurysm  . Nsaids Nausea And Vomiting and Other (See Comments)    Hx of GI bleed    Prior to Admission medications   Medication Sig Start Date End Date Taking? Authorizing Provider  acetaminophen  (TYLENOL ) 500 MG tablet Take 1,000 mg by mouth at bedtime. Give with melatonin    [provider]  albuterol  (VENTOLIN  HFA) 108 (90 Base) MCG/ACT inhaler Inhale 2 puffs into the lungs every 6 (six) hours as needed for wheezing or shortness of breath.    [provider]  aspirin  EC 81 MG tablet Take 1 tablet (81 mg total) by mouth daily. Swallow whole. 03/29/21   Rosemarie Eather RAMAN, MD  atorvastatin  (LIPITOR) 40 MG tablet Take 40 mg by mouth at bedtime.    [provider]  barrier cream (NON-SPECIFIED) CREA Apply 1 Application topically 2 (two) times daily.    [provider]  BREO ELLIPTA  200-25 MCG/ACT AEPB Inhale 1 puff into the lungs daily. 08/02/23   [provider]  cetirizine  (ZYRTEC ) 10 MG tablet Take 10 mg by mouth in the morning.    [provider]  cholecalciferol  (VITAMIN  D3) 25 MCG (1000 UNIT) tablet Take 1,000 Units by mouth in the morning.    [provider]  dextromethorphan  (DELSYM ) 30 MG/5ML liquid Take 10 mLs by mouth 2 (two) times daily as needed for cough.    [provider]  diphenhydramine -acetaminophen  (TYLENOL  PM) 25-500 MG TABS tablet Take 1 tablet by mouth at bedtime as needed (Insomnia).    [provider]  docusate sodium  (COLACE) 100 MG capsule Take 200 mg by mouth in the morning.    [provider]  lidocaine  4 % Place 1 patch onto the skin 2 (two) times daily as needed  (Pain).    [provider]  liver oil-zinc  oxide (DESITIN) 40 % ointment Apply topically 2 (two) times daily. 07/01/23   Samtani, Jai-Gurmukh, MD  melatonin 3 MG TABS tablet Take 9 mg by mouth at bedtime.    [provider]  midodrine  (PROAMATINE ) 5 MG tablet Take 1 tablet (5 mg total) by mouth 2 (two) times daily with a meal. 07/29/23   Fairy Frames, MD  montelukast  (SINGULAIR ) 10 MG tablet Take 1 tablet (10 mg total) by mouth daily. 08/01/21   Neda Jennet LABOR, MD  ondansetron  (ZOFRAN ) 4 MG tablet Take 4 mg by mouth every 6 (six) hours as needed for nausea or vomiting.    [provider]  OXYGEN Inhale 4 L into the lungs continuous. Patient taking differently: Inhale 5 L into the lungs continuous.    [provider]  pantoprazole  (PROTONIX ) 40 MG tablet Take 40 mg by mouth in the morning and at bedtime.    [provider]  PENTASA  500 MG CR capsule Take 1,000 mg by mouth 2 (two) times daily.    [provider]  polyethylene glycol (MIRALAX  / GLYCOLAX ) 17 g packet Take 17 g by mouth 2 (two) times daily as needed for mild constipation or moderate constipation.    [provider]  saccharomyces boulardii (FLORASTOR) 250 MG capsule Take 250 mg by mouth 2 (two) times daily.    [provider]  torsemide  (DEMADEX ) 20 MG tablet Take 40mg  in am and 20mg  in afternoon, take extra dose of 20mg  for weight gain of 3lb in 1day or 5lb in 1 week 08/26/23   Fairy Frames, MD    Past Medical History:  Diagnosis Date  . AAA (abdominal aortic aneurysm) (HCC)   . CHF (congestive heart failure) (HCC)   . Crohn's disease (HCC)   . Diverticulitis   . Diverticulosis   . GERD (gastroesophageal reflux disease)   . GI bleed   . Hypertension   . OSA (obstructive sleep apnea)   . Pacemaker   . Thyroid nodule     Past Surgical History:  Procedure Laterality Date  . CHOLECYSTECTOMY    . COLONOSCOPY WITH PROPOFOL  N/A 06/01/2015    Procedure: COLONOSCOPY WITH PROPOFOL ;  Surgeon: Jerrell Sol, MD;  Location[facility] ENDOSCOPY;  Service: Endoscopy;  Laterality: N/A;  . CORONARY ANGIOPLASTY WITH STENT PLACEMENT    . ESOPHAGOGASTRODUODENOSCOPY (EGD) WITH PROPOFOL  N/A 06/01/2015   Procedure: ESOPHAGOGASTRODUODENOSCOPY (EGD) WITH PROPOFOL ;  Surgeon: Jerrell Sol, MD;  Location[facility] ENDOSCOPY;  Service: Endoscopy;  Laterality: N/A;  . GIVENS CAPSULE STUDY N/A 06/03/2015   Procedure: GIVENS CAPSULE STUDY;  Surgeon: Jerrell Sol, MD;  Location: [facility] ENDOSCOPY;  Service: Endoscopy;  Laterality: N/A;  . INTRAMEDULLARY (IM) NAIL INTERTROCHANTERIC Right 10/30/2022   Procedure: INTRAMEDULLARY nailing of right femur;  Surgeon: Edna Toribio LABOR, MD;  Location: [facility] OR;  Service: Orthopedics;  Laterality: Right;  . IR IVC FILTER PLMT / S&I PORTER GUID/MOD SED  02/11/2023  . PACEMAKER PLACEMENT  2015     reports that she has quit smoking. She has never used smokeless tobacco. She reports that she does not drink alcohol  and does not use drugs.  Family History  Problem Relation Age of Onset  . CVA Mother   . Lung cancer Father   . Colon cancer Neg Hx      Physical Exam: Vitals:   09/14/23 2000 09/14/23 2015 09/14/23 2030 09/14/23 2235  BP: 109/78 115/74 99/63 96/77   Pulse: (!) 102 96 92 (!) 102  Resp: 17 (!) 25 20 (!) 21  Temp:      TempSrc:      SpO2: 90% 92% 93% (!) 89%    Gen: Awake, alert, chronically ill appearing.   CV: Regular, normal S1, S2, no murmurs  Resp: Normal WOB, rales in the bases  Abd: Flat, normoactive, nontender MSK: Symmetric, there is 2-3 pitting edema in a dependent fashion, edema is worse on the L side, suspect because keeps this slightly dependent, there is actually bullae of fluid in the subQ in the L arm.  Skin: Scattered ecchymoses, see MSK for more findings.  Neuro: Alert and interactive  Psych: euthymic, appropriate, very good insight    Data review:   Labs reviewed, notable for:    Personally reviewed  K 3.3  Bicarb 35, AG 14  BUN 68 up, Cr 2.16 (b/l ~ 1.2 - 1.5)  BNP 2920 highest on recent record  HS trop 63, with chronic myocardial injury   Micro:  Results for orders placed or performed during the hospital encounter of 08/17/23  MRSA Next Gen by PCR, Nasal     Status: Abnormal   Collection Time: 08/17/23 10:56 PM   Specimen: Nasal Mucosa; Nasal Swab  Result Value Ref Range Status   MRSA by PCR Next Gen DETECTED (A) NOT DETECTED Final    Comment: RESULT CALLED TO, READ BACK BY AND VERIFIED WITH: C KYEI RN 08/18/23 @ 0038 BY AB (NOTE) The GeneXpert MRSA Assay (FDA approved for NASAL specimens only), is one component of a comprehensive MRSA colonization surveillance program. It is not intended to diagnose MRSA infection nor to guide or monitor treatment for MRSA infections. Test performance is not FDA approved in patients less than 59 years old. Performed at [facility], [address], [place], [place] [address]     Imaging reviewed:  DG Chest Portable 1 View Result Date: 09/14/2023 CLINICAL DATA:  Shortness of breath, CHF EXAM: PORTABLE CHEST 1 VIEW COMPARISON:  08/17/2023 FINDINGS: Single frontal view of the chest demonstrates an enlarged cardiac silhouette, stable. Dual lead pacemaker is unchanged. There is increased central pulmonary vascular congestion, basilar predominant interstitial and ground-glass opacities compatible with edema. Small bilateral pleural effusions, left greater than right. No pneumothorax. IMPRESSION: 1. Moderate congestive heart failure, with worsening volume status since prior exam. Electronically Signed   By: Ozell Daring M.D.   On: 09/14/2023 20:15    EKG:  Personally reviewed SR with 1st deg AVB, RBBB, no acute ischemic changes, likely repolarization change with discordant ST changes. Prolonged Qtc not corrected for BBB    ED Course:  Treated with Lasix  80 IV, Kcl 40, zofran .    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, with hx of heart failure with LV mr ejection fraction, RV failure (Dry weight ~ 182 lb = 82 kg), PHTN, requiring  midodrine  for BP support, mod-severe MR, Mod TR, CAD, PCI to RCA, heart block s/p pacemaker, aortic aneurysm, COPD with chronic hypoxic respiratory failure on 4 L O2, DVT/PE with IVC filter, not a candidate for anticoagulation due to history of GI bleeding., CKD stage IIIa, Crohn's, anemia of chronic disease , and recurrent admissions for heart failure exacerbation, 6 admits this year so far, which appear to be escalating in frequency; last 8/9 - 8/18 and she was discharged on Torsemide  40 / 20 mg and prn for weight gain. Presents with worsening edema and weight gain up to 192 lb despite prn dosing    Acute exacerbation of chronic Heart failure, LV mrEF, RV failure  P/w edema / weight gain up to 192. Dry weight is ~ 182 lb = 82 kg. Reports prn dosing has been ineffective. On home O2. BNP up to 2920. HS trop chronically elevated, see below. CXR with vascular congestion, basilar interstitial change c/w pulm edema, small effusions; worse appearance from prior. Last echo was in 7/'25 with LVEF 45-50%, severe asymmetric septal hypertrophy, + flattening of IV septum c/w RV overload, mild reduced RV systolic function and mod enlarged RV, mod-severe MR, mod TR,  -- Routine Cardiology consult with repeated HF exacerbations, appreciate assistance  - S/p Lasix  80 mg IV in the ED.  Tentatively scheduled for 80 IV BID, adjust as needed. -- Anticipate will need full day at least of trialing PO meds once more euvolemic to ensure I/O remain even  -- Would consider adding scheduled Metolazone  ? Three times per week to her regimen, and likely increase Torsemide  to 40 / 40  - Hold on repeat echo as this was done recently in 7/'25.  - GDMT limited by hypotension  - Heart failure navigator/TOC consult - Low-sodium diet, I/O, daily weights.  -- Palliative involved on previous admission, and  patient has declined hospice and restates this on admission.  -- PT / OT evaluation   Chronic myocardial injury  HS trop elevated since 1/'25, currently HS trop 63 slightly higher from prior. EKG without overt ischemic changes. This likely reflects her decompensated heart failure with ongoing demand  -- Management directed at HF per above   Acute kidney injury stage I  CKD stage IIIb Her renal function has been variable, baseline appears 1.2 - 1.5; although recently has been ~ 1.8. On admission is 2.16 with prerenal azotemia BUN up to 68. Suspect this is related to her RV failure and cardiorenal injury.  -- Continue diuresis per above  -- Check PVR, UA   Chronic medical problems:  Hx PHTN: Noted  Hypotension: Continue home midodrine  for pressure support  Mod-severe MR, mod TR: likely contributing to HF status; poor forward flow  CAD with hx PCI to RCA: Continue aspirin , statin.  Hx HB with PPM: Noted  COPD with CHRF on 4 L O2: Without acute exacerbation,  DuoNeb every 6 hours scheduled, albuterol  every 4 hours as needed, continue home Dulera , singulair   History DVT/PE, IVC filter: Not candidate for anticoagulation due to history of GI bleeding Crohn's disease: Noted, continue home mesalamine   Anemia of chronic disease: Continue home iron GERD: Continue home PPI.   There is no height or weight on file to calculate BMI.    DVT prophylaxis: SCD  Code Status:  DNR/DNI(Do NOT Intubate); confirmed with patient  Diet:  Diet Orders (From admission, onward)    None      Family Communication:  None   Consults:  Cardiology   Admission  status:   Inpatient, Telemetry bed  Severity of Illness: The appropriate patient status for this patient is INPATIENT. Inpatient status is judged to be reasonable and necessary in order to provide the required intensity of service to ensure the patient's safety. The patient's presenting symptoms, physical exam findings, and initial radiographic and  laboratory data in the context of their chronic comorbidities is felt to place them at high risk for further clinical deterioration. Furthermore, it is not anticipated that the patient will be medically stable for discharge from the hospital within 2 midnights of admission.   * I certify that at the point of admission it is my clinical judgment that the patient will require inpatient hospital care spanning beyond 2 midnights from the point of admission due to high intensity of service, high risk for further deterioration and high frequency of surveillance required.*   Dorn Dawson, MD [facility]  How to contact the [facility] Attending or Consulting provider 7A - 7P or covering provider during after hours 7P -7A, for this patient.  Check the care team in [facility] and look for a) attending/consulting [facility] provider listed and b) the [facility] team listed Log into [email or web address] and use [facility]'s universal password to access. If you do not have the password, please contact the hospital operator. Locate the [facility] provider you are looking for under [facility] and page to a number that you can be directly reached. If you still have difficulty reaching the provider, please page the [facility] (Director on Call) for the Hospitalists listed on amion for assistance.  09/14/2023, 10:54 PM

## 2023-09-14 NOTE — ED Notes (Signed)
 EDP Freddi, MD gave the verbal order to monitor patient's oxygen saturation so that it maintains a saturation of 88% or above. Freddi, MD stated to call respiratory therapy for a high flow nasal cannula if patient is maintaining an oxygen saturation below 88%.

## 2023-09-14 NOTE — ED Triage Notes (Signed)
 Patient is coming from [facility] for fluid retention. Daughter noticed swelling particularly on the left side. This has been a recurrent issue. Patient is chronically on 6L O2.

## 2023-09-14 NOTE — ED Provider Notes (Signed)
 [facility] EMERGENCY DEPARTMENT AT [facility] Provider Note   CSN: [id number] Arrival date & time: 09/14/23  1945     Patient presents with: Fluid Retention   Felicia Benson is a 89 y.o. female.   HPI 89 year old female with a history of CHF presents with fluid retention.  She states that since she left the hospital last month her weight has been up and down.  Over the last few days has had swelling, particularly her face and her arms and legs, which is typical for her exacerbations.  She has been compliant with her Bumex.  No chest pain or shortness of breath.  Swelling is always a little worse on the left side.  Tonight her daughter saw her and noticed her face being swollen and sent her to the ER via EMS.  Prior to Admission medications   Medication Sig Start Date End Date Taking? Authorizing Provider  acetaminophen  (TYLENOL ) 500 MG tablet Take 1,000 mg by mouth at bedtime. Give with melatonin    [provider]  albuterol  (VENTOLIN  HFA) 108 (90 Base) MCG/ACT inhaler Inhale 2 puffs into the lungs every 6 (six) hours as needed for wheezing or shortness of breath.    [provider]  aspirin  EC 81 MG tablet Take 1 tablet (81 mg total) by mouth daily. Swallow whole. 03/29/21   Rosemarie Eather RAMAN, MD  atorvastatin  (LIPITOR) 40 MG tablet Take 40 mg by mouth at bedtime.    [provider]  barrier cream (NON-SPECIFIED) CREA Apply 1 Application topically 2 (two) times daily.    [provider]  BREO ELLIPTA  200-25 MCG/ACT AEPB Inhale 1 puff into the lungs daily. 08/02/23   [provider]  cetirizine  (ZYRTEC ) 10 MG tablet Take 10 mg by mouth in the morning.    [provider]  cholecalciferol  (VITAMIN D3) 25 MCG (1000 UNIT) tablet Take 1,000 Units by mouth in the morning.    [provider]  dextromethorphan  (DELSYM ) 30 MG/5ML liquid Take 10 mLs by mouth 2 (two) times daily as needed for cough.    [provider]   diphenhydramine -acetaminophen  (TYLENOL  PM) 25-500 MG TABS tablet Take 1 tablet by mouth at bedtime as needed (Insomnia).    [provider]  docusate sodium  (COLACE) 100 MG capsule Take 200 mg by mouth in the morning.    [provider]  lidocaine  4 % Place 1 patch onto the skin 2 (two) times daily as needed (Pain).    [provider]  liver oil-zinc  oxide (DESITIN) 40 % ointment Apply topically 2 (two) times daily. 07/01/23   Samtani, Jai-Gurmukh, MD  melatonin 3 MG TABS tablet Take 9 mg by mouth at bedtime.    [provider]  midodrine  (PROAMATINE ) 5 MG tablet Take 1 tablet (5 mg total) by mouth 2 (two) times daily with a meal. 07/29/23   Fairy Frames, MD  montelukast  (SINGULAIR ) 10 MG tablet Take 1 tablet (10 mg total) by mouth daily. 08/01/21   Neda Jennet LABOR, MD  ondansetron  (ZOFRAN ) 4 MG tablet Take 4 mg by mouth every 6 (six) hours as needed for nausea or vomiting.    [provider]  OXYGEN Inhale 4 L into the lungs continuous. Patient taking differently: Inhale 5 L into the lungs continuous.    [provider]  pantoprazole  (PROTONIX ) 40 MG tablet Take 40 mg by mouth in the morning and at bedtime.    [provider]  PENTASA  500 MG CR capsule  Take 1,000 mg by mouth 2 (two) times daily.    [provider]  polyethylene glycol (MIRALAX  / GLYCOLAX ) 17 g packet Take 17 g by mouth 2 (two) times daily as needed for mild constipation or moderate constipation.    [provider]  saccharomyces boulardii (FLORASTOR) 250 MG capsule Take 250 mg by mouth 2 (two) times daily.    [provider]  torsemide  (DEMADEX ) 20 MG tablet Take 40mg  in am and 20mg  in afternoon, take extra dose of 20mg  for weight gain of 3lb in 1day or 5lb in 1 week 08/26/23   Fairy Frames, MD    Allergies: Motrin [ibuprofen], Ace inhibitors, Breztri  aerosphere [budeson-glycopyrrol-formoterol ], Cipro [ciprofloxacin hcl], Levaquin  [levofloxacin], and Nsaids    Review of Systems  Respiratory:  Negative for shortness of breath.   Cardiovascular:  Positive for leg swelling. Negative for chest pain.    Updated Vital Signs BP 99/63   Pulse 92   Temp 98.7 F (37.1 C) (Oral)   Resp 20   SpO2 93%   Physical Exam Vitals and nursing note reviewed.  Constitutional:      Appearance: She is well-developed.  HENT:     Head: Normocephalic and atraumatic.  Cardiovascular:     Rate and Rhythm: Normal rate and regular rhythm.     Heart sounds: Normal heart sounds.  Pulmonary:     Effort: Pulmonary effort is normal. No accessory muscle usage.     Breath sounds: Decreased breath sounds present.  Abdominal:     Palpations: Abdomen is soft.     Tenderness: There is no abdominal tenderness.  Musculoskeletal:     Right lower leg: Edema present.     Left lower leg: Edema present.  Skin:    General: Skin is warm and dry.     Comments: No overt swelling to the face or neck appreciated.  There is no pitting edema to her upper extremities.  Some mild pitting edema to the lower extremities.  Neurological:     Mental Status: She is alert.     (all labs ordered are listed, but only abnormal results are displayed) Labs Reviewed  COMPREHENSIVE METABOLIC PANEL WITH GFR - Abnormal; Notable for the following components:      Result Value   Potassium 3.3 (*)    Chloride 89 (*)    CO2 35 (*)    Glucose, Bld 115 (*)    BUN 68 (*)    Creatinine, Ser 2.16 (*)    Calcium  8.4 (*)    Albumin  2.6 (*)    GFR, Estimated 21 (*)    All other components within normal limits  BRAIN NATRIURETIC PEPTIDE - Abnormal; Notable for the following components:   B Natriuretic Peptide 2,920.5 (*)    All other components within normal limits  CBC WITH DIFFERENTIAL/PLATELET - Abnormal; Notable for the following components:   RBC 3.44 (*)    Hemoglobin 9.7 (*)    HCT 31.4 (*)    RDW 17.9 (*)    All other components within normal limits   TROPONIN I (HIGH SENSITIVITY) - Abnormal; Notable for the following components:   Troponin I (High Sensitivity) 63 (*)    All other components within normal limits  TROPONIN I (HIGH SENSITIVITY)    EKG: EKG Interpretation Date/Time:  Saturday September 14 2023 19:52:48 EDT Ventricular Rate:  102 PR Interval:  82 QRS Duration:  165 QT Interval:  462 QTC Calculation: 602 R Axis:   217  Text Interpretation:  Ectopic atrial tachycardia, unifocal Atrial premature complex  similar to Aug 2025 Confirmed by Freddi Hamilton [phone]) on 09/14/2023 8:52:17 PM  Radiology: DG Chest Portable 1 View Result Date: 09/14/2023 CLINICAL DATA:  Shortness of breath, CHF EXAM: PORTABLE CHEST 1 VIEW COMPARISON:  08/17/2023 FINDINGS: Single frontal view of the chest demonstrates an enlarged cardiac silhouette, stable. Dual lead pacemaker is unchanged. There is increased central pulmonary vascular congestion, basilar predominant interstitial and ground-glass opacities compatible with edema. Small bilateral pleural effusions, left greater than right. No pneumothorax. IMPRESSION: 1. Moderate congestive heart failure, with worsening volume status since prior exam. Electronically Signed   By: Ozell Daring M.D.   On: 09/14/2023 20:15     Procedures   Medications Ordered in the ED  furosemide  (LASIX ) injection 80 mg (has no administration in time range)  potassium chloride  (KLOR-CON ) packet 40 mEq (has no administration in time range)  ondansetron  (ZOFRAN -ODT) disintegrating tablet 4 mg (4 mg Oral Given 09/14/23 2113)                                    Medical Decision Making Amount and/or Complexity of Data Reviewed External Data Reviewed: notes. Labs: ordered.    Details: Hypokalemia Radiology: ordered.    Details: CHF ECG/medicine tests: ordered and independent interpretation performed.    Details: No acute changes  Risk Prescription drug management. Decision regarding hospitalization.   Patient  presents with recurrent CHF exacerbation.  She is not in distress.  She is on her home dose of oxygen.  However her workup is consistent with CHF including via chest x-ray findings and elevated BNP.  Was started on IV Lasix  and will give some oral potassium as well.  She appears stable for a medical admission, discussed with Dr. Segars.     Final diagnoses:  Acute on chronic systolic congestive heart failure [facility])    ED Discharge Orders     None          Freddi Hamilton, MD 09/14/23 2313

## 2023-09-15 DIAGNOSIS — I5033 Acute on chronic diastolic (congestive) heart failure: Secondary | ICD-10-CM

## 2023-09-15 DIAGNOSIS — Z86711 Personal history of pulmonary embolism: Secondary | ICD-10-CM

## 2023-09-15 DIAGNOSIS — I5023 Acute on chronic systolic (congestive) heart failure: Secondary | ICD-10-CM

## 2023-09-15 DIAGNOSIS — K219 Gastro-esophageal reflux disease without esophagitis: Secondary | ICD-10-CM

## 2023-09-15 DIAGNOSIS — L8990 Pressure ulcer of unspecified site, unspecified stage: Secondary | ICD-10-CM | POA: Diagnosis present

## 2023-09-15 DIAGNOSIS — I959 Hypotension, unspecified: Secondary | ICD-10-CM | POA: Diagnosis not present

## 2023-09-15 DIAGNOSIS — I2510 Atherosclerotic heart disease of native coronary artery without angina pectoris: Secondary | ICD-10-CM

## 2023-09-15 DIAGNOSIS — D638 Anemia in other chronic diseases classified elsewhere: Secondary | ICD-10-CM

## 2023-09-15 DIAGNOSIS — J439 Emphysema, unspecified: Secondary | ICD-10-CM

## 2023-09-15 DIAGNOSIS — I340 Nonrheumatic mitral (valve) insufficiency: Secondary | ICD-10-CM

## 2023-09-15 DIAGNOSIS — G4733 Obstructive sleep apnea (adult) (pediatric): Secondary | ICD-10-CM

## 2023-09-15 DIAGNOSIS — N1832 Chronic kidney disease, stage 3b: Secondary | ICD-10-CM | POA: Diagnosis not present

## 2023-09-15 DIAGNOSIS — K50819 Crohn's disease of both small and large intestine with unspecified complications: Secondary | ICD-10-CM

## 2023-09-15 DIAGNOSIS — E782 Mixed hyperlipidemia: Secondary | ICD-10-CM

## 2023-09-15 DIAGNOSIS — E66811 Obesity, class 1: Secondary | ICD-10-CM

## 2023-09-15 DIAGNOSIS — I272 Pulmonary hypertension, unspecified: Secondary | ICD-10-CM | POA: Diagnosis not present

## 2023-09-15 DIAGNOSIS — H353190 Nonexudative age-related macular degeneration, unspecified eye, stage unspecified: Secondary | ICD-10-CM

## 2023-09-15 DIAGNOSIS — Z9861 Coronary angioplasty status: Secondary | ICD-10-CM

## 2023-09-15 DIAGNOSIS — I10 Essential (primary) hypertension: Secondary | ICD-10-CM | POA: Diagnosis not present

## 2023-09-15 LAB — URINALYSIS, ROUTINE W REFLEX MICROSCOPIC
Bilirubin Urine: NEGATIVE
Glucose, UA: NEGATIVE mg/dL
Ketones, ur: NEGATIVE mg/dL
Nitrite: NEGATIVE
Protein, ur: NEGATIVE mg/dL
Specific Gravity, Urine: 1.008 (ref 1.005–1.030)
pH: 6 (ref 5.0–8.0)

## 2023-09-15 LAB — BASIC METABOLIC PANEL WITH GFR
Anion gap: 15 (ref 5–15)
BUN: 67 mg/dL — ABNORMAL HIGH (ref 8–23)
CO2: 32 mmol/L (ref 22–32)
Calcium: 8.5 mg/dL — ABNORMAL LOW (ref 8.9–10.3)
Chloride: 88 mmol/L — ABNORMAL LOW (ref 98–111)
Creatinine, Ser: 2.03 mg/dL — ABNORMAL HIGH (ref 0.44–1.00)
GFR, Estimated: 23 mL/min — ABNORMAL LOW (ref >60)
Glucose, Bld: 91 mg/dL (ref 70–99)
Potassium: 3.2 mmol/L — ABNORMAL LOW (ref 3.5–5.1)
Sodium: 135 mmol/L (ref 135–145)

## 2023-09-15 LAB — BLOOD GAS, VENOUS
Acid-Base Excess: 11 mmol/L — ABNORMAL HIGH (ref 0.0–2.0)
Bicarbonate: 38.7 mmol/L — ABNORMAL HIGH (ref 20.0–28.0)
O2 Saturation: 48.1 %
Patient temperature: 36.6
pCO2, Ven: 63 mmHg — ABNORMAL HIGH (ref 44–60)
pH, Ven: 7.4 (ref 7.25–7.43)
pO2, Ven: 31 mmHg — CL (ref 32–45)

## 2023-09-15 LAB — TROPONIN I (HIGH SENSITIVITY): Troponin I (High Sensitivity): 62 ng/L — ABNORMAL HIGH (ref <18)

## 2023-09-15 LAB — MAGNESIUM: Magnesium: 1.8 mg/dL (ref 1.7–2.4)

## 2023-09-15 LAB — PHOSPHORUS: Phosphorus: 4.4 mg/dL (ref 2.5–4.6)

## 2023-09-15 LAB — TSH: TSH: 1.019 u[IU]/mL (ref 0.350–4.500)

## 2023-09-15 MED ORDER — ACETAMINOPHEN 325 MG PO TABS
650.0000 mg | ORAL_TABLET | Freq: Every day | ORAL | Status: DC
  Administered 2023-09-15 – 2023-09-22 (×8): 650 mg via ORAL
  Filled 2023-09-15 (×8): qty 2

## 2023-09-15 MED ORDER — PANTOPRAZOLE SODIUM 40 MG PO TBEC
40.0000 mg | DELAYED_RELEASE_TABLET | Freq: Two times a day (BID) | ORAL | Status: DC
  Administered 2023-09-15 – 2023-09-22 (×17): 40 mg via ORAL
  Filled 2023-09-15 (×17): qty 1

## 2023-09-15 MED ORDER — DOCUSATE SODIUM 100 MG PO CAPS
200.0000 mg | ORAL_CAPSULE | Freq: Every day | ORAL | Status: DC
  Administered 2023-09-15 – 2023-09-22 (×8): 200 mg via ORAL
  Filled 2023-09-15 (×8): qty 2

## 2023-09-15 MED ORDER — MELATONIN 3 MG PO TABS: 9.0000 mg | ORAL_TABLET | Freq: Every evening | ORAL | Status: DC | PRN

## 2023-09-15 MED ORDER — ASPIRIN 81 MG PO TBEC
81.0000 mg | DELAYED_RELEASE_TABLET | Freq: Every day | ORAL | Status: DC
  Administered 2023-09-15 – 2023-09-22 (×8): 81 mg via ORAL
  Filled 2023-09-15 (×8): qty 1

## 2023-09-15 MED ORDER — LORATADINE 10 MG PO TABS
10.0000 mg | ORAL_TABLET | Freq: Every day | ORAL | Status: DC | PRN
  Administered 2023-09-15: 10 mg via ORAL
  Filled 2023-09-15: qty 1

## 2023-09-15 MED ORDER — POLYETHYLENE GLYCOL 3350 17 G PO PACK
17.0000 g | PACK | Freq: Two times a day (BID) | ORAL | Status: DC | PRN
  Administered 2023-09-16 – 2023-09-20 (×3): 17 g via ORAL
  Filled 2023-09-15 (×3): qty 1

## 2023-09-15 MED ORDER — GUAIFENESIN-DM 100-10 MG/5ML PO SYRP
5.0000 mL | ORAL_SOLUTION | ORAL | Status: DC | PRN
  Administered 2023-09-15 – 2023-09-22 (×5): 5 mL via ORAL
  Filled 2023-09-15 (×5): qty 5

## 2023-09-15 MED ORDER — DIPHENHYDRAMINE HCL 25 MG PO CAPS: 25.0000 mg | ORAL_CAPSULE | Freq: Every evening | ORAL | Status: DC | PRN

## 2023-09-15 MED ORDER — IPRATROPIUM-ALBUTEROL 0.5-2.5 (3) MG/3ML IN SOLN
3.0000 mL | Freq: Four times a day (QID) | RESPIRATORY_TRACT | Status: DC | PRN
  Administered 2023-09-16: 3 mL via RESPIRATORY_TRACT
  Filled 2023-09-15: qty 3

## 2023-09-15 MED ORDER — DIPHENHYDRAMINE HCL 25 MG PO CAPS
25.0000 mg | ORAL_CAPSULE | Freq: Every day | ORAL | Status: DC
  Administered 2023-09-15 – 2023-09-22 (×8): 25 mg via ORAL
  Filled 2023-09-15 (×8): qty 1

## 2023-09-15 MED ORDER — ACETAMINOPHEN 325 MG PO TABS
650.0000 mg | ORAL_TABLET | Freq: Four times a day (QID) | ORAL | Status: DC | PRN
  Administered 2023-09-18: 650 mg via ORAL
  Filled 2023-09-15: qty 2

## 2023-09-15 MED ORDER — MIDODRINE HCL 5 MG PO TABS
5.0000 mg | ORAL_TABLET | Freq: Two times a day (BID) | ORAL | Status: DC
  Administered 2023-09-15 – 2023-09-22 (×16): 5 mg via ORAL
  Filled 2023-09-15 (×17): qty 1

## 2023-09-15 MED ORDER — POTASSIUM CHLORIDE CRYS ER 20 MEQ PO TBCR
40.0000 meq | EXTENDED_RELEASE_TABLET | Freq: Once | ORAL | Status: AC
  Administered 2023-09-15: 40 meq via ORAL
  Filled 2023-09-15: qty 2

## 2023-09-15 MED ORDER — MESALAMINE ER 250 MG PO CPCR
1000.0000 mg | ORAL_CAPSULE | Freq: Two times a day (BID) | ORAL | Status: DC
  Administered 2023-09-15 – 2023-09-22 (×17): 1000 mg via ORAL
  Filled 2023-09-15 (×17): qty 4

## 2023-09-15 MED ORDER — ATORVASTATIN CALCIUM 40 MG PO TABS
40.0000 mg | ORAL_TABLET | Freq: Every day | ORAL | Status: DC
  Administered 2023-09-16 – 2023-09-22 (×7): 40 mg via ORAL
  Filled 2023-09-15 (×7): qty 1

## 2023-09-15 MED ORDER — MEDIHONEY WOUND/BURN DRESSING EX PSTE
1.0000 | PASTE | Freq: Every day | CUTANEOUS | Status: DC
  Administered 2023-09-15 – 2023-09-22 (×8): 1 via TOPICAL
  Filled 2023-09-15: qty 44

## 2023-09-15 MED ORDER — MONTELUKAST SODIUM 10 MG PO TABS
10.0000 mg | ORAL_TABLET | Freq: Every day | ORAL | Status: DC
  Administered 2023-09-15 – 2023-09-22 (×8): 10 mg via ORAL
  Filled 2023-09-15 (×8): qty 1

## 2023-09-15 MED ORDER — FLUTICASONE FUROATE-VILANTEROL 200-25 MCG/ACT IN AEPB
1.0000 | INHALATION_SPRAY | Freq: Every day | RESPIRATORY_TRACT | Status: DC
  Administered 2023-09-15 – 2023-09-22 (×8): 1 via RESPIRATORY_TRACT
  Filled 2023-09-15 (×2): qty 28

## 2023-09-15 NOTE — Assessment & Plan Note (Signed)
 No signs of acute exacerbation Continue supplemental 02 per [place] Continue bronchodilator

## 2023-09-15 NOTE — Assessment & Plan Note (Signed)
Continue proton pump inhibitor

## 2023-09-15 NOTE — Assessment & Plan Note (Signed)
 Cpap

## 2023-09-15 NOTE — Consult Note (Addendum)
 Cardiology Consultation   Patient ID: Felicia Benson MRN: [id number]; DOB: 12/18/1930  Admit date: 09/14/2023 Date of Consult: 09/15/2023  PCP:  [facility] Providers Cardiologist:  Felicia Scarce, Benson  Electrophysiologist:  Felicia Gladis Norton, Benson      Patient Profile: Felicia Benson is a 89 y.o. 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  History of Present Illness: [name] is a 89 year old female with prior cardiac history listed below  Patient was previously followed by cardiology in [place]  Was hospitalized on 12/2022 for a lower GI bleed from Crohn's disease.  During hospitalization required multiple transfusions.  Fortunately was rehospitalized on 01/2023 for worsening shortness of breath and was found to have a acute on chronic CHF, PE and pneumonia. At this time cardiology was consulted. Because of the recent GI bleed and IVC filter was placed rather than starting a DOAC.  The patient was diuresed using IV Lasix .  Per the discharge summary on 02/19/23 the patient was instructed to take 40 mg p.o. Lasix  daily.  On 2/27.2025 was rehospitalized at [facility] with hypoxia and CHF exacerbation following a fall.  At the nursing facility the patient was reportedly only taking 20 mg PO Lasix .  Echocardiogram during this hospitalization showed a low normal LVEF of 50 to 55%, a D shaped septum suggestive of increased RV pressure estimated at (59.6 mmHg), severely reduced RV systolic function, moderately enlarged RV, moderate TR, small  pericardial effusion, and IVC with greater than 50% respiratory variability.  Labs also showed the patient had a critically low albumin . During his hospitalization the patient was hypotensive and required midodrine .  During this hospitalization palliative care was consulted to help assess goals of care.  The patient was discharged on 20 mg of torsemide  daily and midodrine .   Patient had an office visit with Dr. Norton on 04/2023.  At that time the patient appeared to be doing well from a volume perspective and no changes were made to her pacemaker.   On 06/2023 patient was readmitted for shortness of breath.  The patient was found to have an elevated BNP and chest x-ray indicated that patient was volume up.  Because of this the patient was felt to have heart failure and was diuresed with IV Lasix .  Was discharged to a nursing facility on 40 mg torsemide  every other day with instructions to take an additional 20 mg for weight gain.  On 07/23/2023 she was rehospitalized for hypoxia with an SpO2 in the 70's.  Chest x-ray indicated pulmonary edema and bilateral pleural effusions and the patient's BNP was elevated.  A repeat echo was done and showed a moderately decreased LVEF of 45 to 50%, global hypokinesis, asymmetric LVH, a flattened septum consistent with increased RV pressure, moderate to severe MR, moderate TR, mild aortic valve regurgitation, and variable IVC>50%. Was discharged home on 20 mg of IV Lasix  twice daily and midodrine  5 mg twice daily  Was seen for outpatient follow-up by on 08/08/2023.  At that time she was documented to be on torsemide  40 mg  in the a.m. and 20 mg in the p.m. at that follow-up she appears to be euvolemic and did not have significant shortness of breath.   Was rehospitalized on 08/17/2023 for acute on chronic heart failure and a 20 pound weight gain.  Was diuresed using IV Lasix  and metolazone .  GDMT was limited by hypotension. during this hospitalization was seen by palliative  care and hospice was recommended but patient refused. Was discharged home on torsemide  40 mg in the a.m. and 20 mg in the p.m with instructions to increase the dose for weight gain.  Also remains on midodrine .  Patient presented to the emergency department on 09/14/2023 for worsening edema.  She was felt to have heart failure and admitted to the hospitalist service.  Blood pressures have been soft most recent BP 93/62. the patient is currently not interested in pursuing hospice.  On interview the patient reported that she presented to the hospital for worsening edema and shortness of breath.  Stated that she feels like the shortness of breath has improved today.  Was able to lay the patient flat on the bed without her having shortness of breath.  Reported that she had slow weight gain at the nursing facility that was not improved by p.o. Lasix .  Patient reported that she was on p.o. torsemide  40 milligrams in the a.m. and 20 mg in the p.m. and that she was rarely given the additional 20 mg torsemide . Denied any chest pain, fever, chills, diaphoresis, melena, hematochezia, and hematuria.   Labs showed elevated BNP of 2920, elevated high-sensitivity troponins 63 > 62, hypokalemia of 3.2, chloride of 88, elevated creatinine of 2.03 this is up from patient's baseline of about 1.5, hypocalcemia of 8.5, anemia with a hemoglobin of 9.7, normal TSH, hypoalbuminemia of 2.6, and the patient's urinalysis was positive for hemoglobin and leukocytes.  Chest x-ray showed: Moderate congestive heart failure, with worsening volume status since prior exam.  EKG showed a sensed V paced rhythm, wide QRS's, with a rate of 102 and a PAC.  Past Medical History:  Diagnosis Date   AAA (abdominal aortic aneurysm) (HCC)    CHF (congestive heart failure) (HCC)    Crohn's disease (HCC)    Diverticulitis    Diverticulosis    GERD (gastroesophageal reflux disease)    GI bleed    Hypertension    OSA (obstructive sleep apnea)     Pacemaker    Thyroid nodule     Past Surgical History:  Procedure Laterality Date   CHOLECYSTECTOMY     COLONOSCOPY WITH PROPOFOL  N/A 06/01/2015   Procedure: COLONOSCOPY WITH PROPOFOL ;  Surgeon: Jerrell Sol, Benson;  Location: [facility] ENDOSCOPY;  Service: Endoscopy;  Laterality: N/A;   CORONARY ANGIOPLASTY WITH STENT PLACEMENT     ESOPHAGOGASTRODUODENOSCOPY (EGD) WITH PROPOFOL  N/A 06/01/2015   Procedure: ESOPHAGOGASTRODUODENOSCOPY (EGD) WITH PROPOFOL ;  Surgeon: Jerrell Sol, Benson;  Location: [facility] ENDOSCOPY;  Service: Endoscopy;  Laterality: N/A;   GIVENS CAPSULE STUDY N/A 06/03/2015   Procedure: GIVENS CAPSULE STUDY;  Surgeon: Jerrell Sol, Benson;  Location: [facility] ENDOSCOPY;  Service: Endoscopy;  Laterality: N/A;   INTRAMEDULLARY (IM) NAIL INTERTROCHANTERIC Right 10/30/2022   Procedure: INTRAMEDULLARY nailing of right femur;  Surgeon: Edna Toribio LABOR, Benson;  Location: [facility] OR;  Service: Orthopedics;  Laterality: Right;   IR IVC FILTER PLMT / S&I /IMG GUID/MOD SED  02/11/2023   PACEMAKER PLACEMENT  2015     Home Medications:  Prior to Admission medications   Medication Sig Start Date End Date Taking? Authorizing  Provider  acetaminophen  (TYLENOL ) 500 MG tablet Take 1,000 mg by mouth at bedtime. Give with melatonin    Provider, Historical, Benson  albuterol  (VENTOLIN  HFA) 108 (90 Base) MCG/ACT inhaler Inhale 2 puffs into the lungs every 6 (six) hours as needed for wheezing or shortness of breath.    Provider, Historical, Benson  aspirin  EC 81 MG tablet Take 1 tablet (81 mg total) by mouth daily. Swallow whole. 03/29/21   Rosemarie Eather RAMAN, Benson  atorvastatin  (LIPITOR) 40 MG tablet Take 40 mg by mouth at bedtime.    Provider, Historical, Benson  barrier cream (NON-SPECIFIED) CREA Apply 1 Application topically 2 (two) times daily.    Provider, Historical, Benson  BREO ELLIPTA  200-25 MCG/ACT AEPB Inhale 1 puff into the lungs daily. 08/02/23   Provider, Historical, Benson  cetirizine  (ZYRTEC ) 10 MG tablet Take 10 mg by mouth  in the morning.    Provider, Historical, Benson  cholecalciferol  (VITAMIN D3) 25 MCG (1000 UNIT) tablet Take 1,000 Units by mouth in the morning.    Provider, Historical, Benson  dextromethorphan  (DELSYM ) 30 MG/5ML liquid Take 10 mLs by mouth 2 (two) times daily as needed for cough.    Provider, Historical, Benson  diphenhydramine -acetaminophen  (TYLENOL  PM) 25-500 MG TABS tablet Take 1 tablet by mouth at bedtime as needed (Insomnia).    Provider, Historical, Benson  docusate sodium  (COLACE) 100 MG capsule Take 200 mg by mouth in the morning.    Provider, Historical, Benson  lidocaine  4 % Place 1 patch onto the skin 2 (two) times daily as needed (Pain).    Provider, Historical, Benson  liver oil-zinc  oxide (DESITIN) 40 % ointment Apply topically 2 (two) times daily. 07/01/23   Samtani, Jai-Gurmukh, Benson  melatonin 3 MG TABS tablet Take 9 mg by mouth at bedtime.    Provider, Historical, Benson  midodrine  (PROAMATINE ) 5 MG tablet Take 1 tablet (5 mg total) by mouth 2 (two) times daily with a meal. 07/29/23   Fairy Frames, Benson  montelukast  (SINGULAIR ) 10 MG tablet Take 1 tablet (10 mg total) by mouth daily. 08/01/21   Neda Jennet LABOR, Benson  ondansetron  (ZOFRAN ) 4 MG tablet Take 4 mg by mouth every 6 (six) hours as needed for nausea or vomiting.    Provider, Historical, Benson  OXYGEN Inhale 4 L into the lungs continuous. Patient taking differently: Inhale 5 L into the lungs continuous.    Provider, Historical, Benson  pantoprazole  (PROTONIX ) 40 MG tablet Take 40 mg by mouth in the morning and at bedtime.    Provider, Historical, Benson  PENTASA  500 MG CR capsule Take 1,000 mg by mouth 2 (two) times daily.    Provider, Historical, Benson  polyethylene glycol (MIRALAX  / GLYCOLAX ) 17 g packet Take 17 g by mouth 2 (two) times daily as needed for mild constipation or moderate constipation.    Provider, Historical, Benson  saccharomyces boulardii (FLORASTOR) 250 MG capsule Take 250 mg by mouth 2 (two) times daily.    Provider, Historical, Benson  torsemide   (DEMADEX ) 20 MG tablet Take 40mg  in am and 20mg  in afternoon, take extra dose of 20mg  for weight gain of 3lb in 1day or 5lb in 1 week 08/26/23   Fairy Frames, Benson    Scheduled Meds:  aspirin  EC  81 mg Oral Daily   [START ON 09/16/2023] atorvastatin   40 mg Oral QHS   fluticasone  furoate-vilanterol  1 puff Inhalation Daily   furosemide   80 mg Intravenous BID   mesalamine   1,000 mg Oral BID  midodrine   5 mg Oral BID WC   montelukast   10 mg Oral Daily   pantoprazole   40 mg Oral BID   potassium chloride   40 mEq Oral Once   sodium chloride  flush  3 mL Intravenous Q12H   Continuous Infusions:  PRN Meds: acetaminophen , ipratropium-albuterol , loratadine , melatonin, ondansetron  (ZOFRAN ) IV, polyethylene glycol  Allergies:    Allergies  Allergen Reactions   Motrin [Ibuprofen] Other (See Comments)    GI bleed   Ace Inhibitors Cough   Breztri  Aerosphere [Budeson-Glycopyrrol-Formoterol ] Hypertension   Cipro [Ciprofloxacin Hcl] Other (See Comments)    Avoid fluoroquinolones due to asc-aortic aneurysm   Levaquin [Levofloxacin] Other (See Comments)    Avoid fluoroquinolones due to asc-aortic aneurysm   Nsaids Nausea And Vomiting and Other (See Comments)    Hx of GI bleed    Social History:   Social History   Socioeconomic History   Marital status: Widowed    Spouse name: Not on file   Number of children: Not on file   Years of education: Not on file   Highest education level: Not on file  Occupational History   Not on file  Tobacco Use   Smoking status: Former   Smokeless tobacco: Never  Vaping Use   Vaping status: Never Used  Substance and Sexual Activity   Alcohol  use: No    Comment: Quit in 1985   Drug use: No   Sexual activity: Not on file  Other Topics Concern   Not on file  Social History Narrative   Not on file   Social Drivers of Benson   Financial Resource Strain: Not on file  Food Insecurity: No Food Insecurity (09/15/2023)   Hunger Vital Sign    Worried About  Running Out of Food in the Last Year: Never true    Ran Out of Food in the Last Year: Never true  Transportation Needs: No Transportation Needs (09/15/2023)   PRAPARE - Administrator, Civil Service (Medical): No    Lack of Transportation (Non-Medical): No  Physical Activity: Not on file  Stress: Not on file  Social Connections: Moderately Integrated (09/15/2023)   Social Connection and Isolation Panel    Frequency of Communication with Friends and Family: More than three times a week    Frequency of Social Gatherings with Friends and Family: Once a week    Attends Religious Services: Never    Database administrator or Organizations: No    Attends Engineer, structural: 1 to 4 times per year    Marital Status: Married  Recent Concern: Social Connections - Moderately Isolated (07/24/2023)   Social Connection and Isolation Panel    Frequency of Communication with Friends and Family: More than three times a week    Frequency of Social Gatherings with Friends and Family: More than three times a week    Attends Religious Services: Never    Database administrator or Organizations: No    Attends Banker Meetings: 1 to 4 times per year    Marital Status: Widowed  Intimate Partner Violence: Not At Risk (09/15/2023)   Humiliation, Afraid, Rape, and Kick questionnaire    Fear of Current or Ex-Partner: No    Emotionally Abused: No    Physically Abused: No    Sexually Abused: No    Family History:    Family History  Problem Relation Age of Onset   CVA Mother    Lung cancer Father    Colon  cancer Neg Hx      ROS:  Please see the history of present illness.   All other ROS reviewed and negative.     Physical Exam/Data: Vitals:   09/15/23 0000 09/15/23 0006 09/15/23 0100 09/15/23 0520  BP: 104/78  115/87 101/71  Pulse: 94  96 85  Resp: 19  16 15   Temp:  97.6 F (36.4 C)  98.2 F (36.8 C)  TempSrc:  Oral Oral Oral  SpO2: 90%  91% 91%  Weight:   92.2 kg    Height:   5' 6 (1.676 m)     Intake/Output Summary (Last 24 hours) at 09/15/2023 0738 Last data filed at 09/15/2023 9361 Gross per 24 hour  Intake 53 ml  Output 200 ml  Net -147 ml      09/15/2023    1:00 AM 08/26/2023    6:09 AM 08/25/2023    4:18 AM  Last 3 Weights  Weight (lbs) 203 lb 4.2 oz 187 lb 9.8 oz 186 lb 15.2 oz  Weight (kg) 92.2 kg 85.1 kg 84.8 kg     Body mass index is 32.81 kg/m.  General:  Well nourished, well developed, in no acute distress, alert and orientated, on 6 L nasal cannula HEENT: normal Neck: Positive JVD to jaw Vascular: No carotid bruits; Distal pulses 2+ bilaterally Cardiac:  normal S1, S2; RRR; 2 out of 6 systolic murmur Lungs: Bibasilar crackles Abd: soft, nontender, no hepatomegaly  Ext: no edema.  2+ bilateral edema Musculoskeletal:  No deformities Skin: warm and dry  Neuro:   no focal abnormalities noted Psych:  Normal affect   EKG:  The EKG was personally reviewed and demonstrates: Atrial sensed ventricular paced rhythm with a rate of 102 and a PAC Telemetry:  Telemetry was personally reviewed and demonstrates: Atrial sensed ventricular paced rhythm with heart rates in the 80s to 90s  Relevant CV Studies:   Laboratory Data: High Sensitivity Troponin:   Recent Labs  Lab 08/17/23 1953 08/17/23 2139 09/14/23 2125 09/14/23 2339  TROPONINIHS 47* 49* 63* 62*     Chemistry Recent Labs  Lab 09/14/23 2125 09/15/23 0225  NA 138 135  K 3.3* 3.2*  CL 89* 88*  CO2 35* 32  GLUCOSE 115* 91  BUN 68* 67*  CREATININE 2.16* 2.03*  CALCIUM  8.4* 8.5*  MG  --  1.8  GFRNONAA 21* 23*  ANIONGAP 14 15    Recent Labs  Lab 09/14/23 2125  PROT 6.5  ALBUMIN  2.6*  AST 27  ALT 21  ALKPHOS 83  BILITOT 0.7   Lipids No results for input(s): CHOL, TRIG, HDL, LABVLDL, LDLCALC, CHOLHDL in the last 168 hours.  Hematology Recent Labs  Lab 09/14/23 2125  WBC 7.6  RBC 3.44*  HGB 9.7*  HCT 31.4*  MCV 91.3  MCH 28.2  MCHC 30.9   RDW 17.9*  PLT 228   Thyroid  Recent Labs  Lab 09/15/23 0225  TSH 1.019    BNP Recent Labs  Lab 09/14/23 2125  BNP 2,920.5*    DDimer No results for input(s): DDIMER in the last 168 hours.  Radiology/Studies:  DG Chest Portable 1 View Result Date: 09/14/2023 CLINICAL DATA:  Shortness of breath, CHF EXAM: PORTABLE CHEST 1 VIEW COMPARISON:  08/17/2023 FINDINGS: Single frontal view of the chest demonstrates an enlarged cardiac silhouette, stable. Dual lead pacemaker is unchanged. There is increased central pulmonary vascular congestion, basilar predominant interstitial and ground-glass opacities compatible with edema. Small bilateral pleural effusions, left greater than  right. No pneumothorax. IMPRESSION: 1. Moderate congestive heart failure, with worsening volume status since prior exam. Electronically Signed   By: Ozell Daring M.D.   On: 09/14/2023 20:15     Assessment and Plan: NUBIA ZIESMER is a 89 y.o. female with a hx of prior tobacco use, COPD on 5 L oxygen at baseline, chronic respiratory failure, OSA on CPAP, pulmonary hypertension, AAA, hypertension, hyperlipidemia, Crohn's disease with prior GI bleeds, CKD stage IIIb, PE/DVT s/p IVC filter 01/2023, complete heart block s/p PPM in 07/2022, and CAD s/p PCI to the RCA with repeat PCI in 2017 for in-stent restenosis who is being seen 09/15/2023 for the evaluation of heart failure at the request of Felicia Sieving Arrien Benson.  Acute on chronic HFmrEF Pulmonary hypertension RV failure Moderate to severe MR A repeat echo was done and showed a moderately decreased LVEF of 45 to 50%, global hypokinesis, asymmetric LVH, a flattened septum consistent with increased RV pressure, moderate to severe MR, moderate TR, mild aortic valve regurgitation, and variable IVC>50% Patient has had multiple hospitalizations for heart failure.  Prior to this admission was on torsemide  40 mg in a.m. and 20 mg in the p.m. with an additional 20 mg dose as  needed for weight gain. Patient appeared volume up on exam Labs showed elevated BNP of 2920. Chest x-ray showed: Moderate congestive heart failure, with worsening volume status since prior exam. I's and O's showed +0.2L but patient reported that pure wick was not catching all of her urine suspect I's and O's are not accurate.  At last outpatient visit patient's weight was 85 kg's weight this hospitalization was 92 Kgs. Continue Lasix  80 mg twice daily GDMT is limited by patient's hypotension requiring midodrine .  Is a poor candidate for SGLT2 with her decreased mobility.   Hypoalbuminemia Albumin  was 2.6.  This is likely contributing to the patient's edema. Patient appears to have some failure to thrive and reported eating soup for lunch and graham crackers for dinner   Complete heart block s/p PPM in 07/2022 Telemetry shows a sensed V paced rhythm in the 80s to 90s.   DVT/PE s/p IVC filter on 1/25 Patient received IVC filter because of his had multiple GI bleeds from Crohns disease.   Hypotension Blood pressures remain soft at times most recent BP 93/62. Continue midodrine  5 milligrams twice daily   CAD s/p PCI to the RCA Hyperlipidemia Continue atorvastatin  40 mg daily Continue aspirin  81 mg daily   Prior tobacco use COPD On 5 L of oxygen at baseline.  Is currently on 6 L .  Suspect this may be contributing to the patient's pulmonary hypertension.   OSA on CPAP Suspect this may be contributing to the patient's pulmonary hypertension.   Retching Coughing up bloody sputum Patient was doing this Dr Inocencio went to see.  Analaura Messler inform the patient's primary   Otherwise managed per primary    Risk Assessment/Risk Scores:       New [place]  Heart Association (NYHA) Functional Class NYHA Class III       For questions or updates, please contact [facility] Please consult [email or web address] for contact info under    Signed, Morse Clause, PA-C  09/15/2023 7:38  AM  I have seen and examined this patient with Zane Adams.  Agree with above, note added to reflect my findings.  Patient with a past history as above.  She presents to the hospital with worsening edema.  She has not had much shortness of breath.  She states that the left side is edematous.  On further questioning, she specifically says her left jaw, arm, breast.  On presentation, she had an elevated BNP of 2920.  Her troponin was mildly elevated though flat.  She was noted to be hypokalemic.  She has been getting Lasix  with improvement in her symptoms, though no change in her eyes and nose.  She states that her PureWick is not functioning appropriately.  Chest x-ray showed moderate congestive heart failure, worse since prior exam.  EKG shows sinus rhythm with ventricular pacing.  While in the room, the patient became nauseous and has been retching.  She has been coughing up some bloody sputum.  GEN: No acute distress.   Neck: No JVD Cardiac: RRR, no murmurs, rubs, or gallops.  Respiratory: normal BS bases bilaterally. GI: Soft, nontender, non-distended  MS: No edema; No deformity. Neuro:  Nonfocal  Skin: warm and dry Psych: Normal affect    Acute on chronic heart failure with mildly reduced ejection fraction Pulmonary hypertension  right ventricular failure Moderate to severe mitral regurgitation  Echo shows an ejection fraction of 45 to 50% with global hypokinesis.  She presented volume overloaded.  The patient does state that she has been getting weight at her facility, but they have not been making adjustments in her diuretics.  This Jamie Hafford be very important at discharge as she has had multiple admissions to the hospital over the last few months with heart failure symptoms.  For now, would continue diuresis with her current Lasix  regimen.  She is feeling improved.  5.  Complete heart block: Continue ventricular pacing.  No pacemaker changes at this time 6.  DVT/PE: Has received IVC  filter. 7.  Coronary artery disease: Post PCI to the RCA.  No current chest pain. 8.  Nausea, bloody sputum: Plan per primary team Jaxie Racanelli M. Charmain Diosdado Benson 09/15/2023 11:19 AM

## 2023-09-15 NOTE — Progress Notes (Signed)
 OT Cancellation Note  Patient Details Name: Felicia Benson MRN: [id number] DOB: July 27, 1930   Cancelled Treatment:    Reason Eval/Treat Not Completed: Medical issues which prohibited therapy. Per PT, pt with respiratory issues limiting pt. Will hold eval until medical status improves.  Neyland Pettengill C, OT  Acute Rehabilitation Services Office [phone] Secure chat preferred   Felicia Benson 09/15/2023, 2:25 PM

## 2023-09-15 NOTE — Assessment & Plan Note (Signed)
 Outpatient follow up.

## 2023-09-15 NOTE — Assessment & Plan Note (Addendum)
 No chest pain, no acute coronary syndrome,  Elevation of high sensitive troponin due to heart failure exacerbation  Continue aspirin  and statin

## 2023-09-15 NOTE — Assessment & Plan Note (Signed)
 Sp IV filter, not on oral anticoagulation

## 2023-09-15 NOTE — Progress Notes (Signed)
 Patient has an existing wound on the coccyx this admission. Stage 3 pressure injury was seen upon assessment by this RN and other RN. Per protocol, Segars, MD was notified by this RN to put a WOC.  Day shift RN made aware.

## 2023-09-15 NOTE — Progress Notes (Signed)
 PT Cancellation Note  Patient Details Name: Felicia Benson MRN: [id number] DOB: 06-20-1930   Cancelled Treatment:    Reason Eval/Treat Not Completed: Medical issues which prohibited therapy. Pt sleeping soundly. Current sats are 88% on 6L. Will hold PT eval until resp status improves.    [name] Shaker 09/15/2023, 1:59 PM

## 2023-09-15 NOTE — Assessment & Plan Note (Signed)
 Continue with statin therapy.  ?

## 2023-09-15 NOTE — ED Notes (Signed)
 This nurse called [facility] to see if they are ready to receive patient. [facility] stated they are ready to receive patient.

## 2023-09-15 NOTE — Hospital Course (Addendum)
 Felicia Benson was admitted to the hospital with the working diagnosis of acute heart failure exacerbation.   89 yo female with the past medical history of heart failure, pulmonary hypertension, coronary artery disease, heart block sp pacemaker, COPD, CKD, DVT/PE, and Crohn's disease who presented with lower extremity edema and weight gain.  She had worsening symptoms despite aggressive oral diuretic at the SNF.  On her initial physical examination her blood pressure was 109/78, HR 102 RR 25 and 02 saturation 89%  Lungs with bilateral rales and decreased inspiratory effort, heart with S1 and S2 present and regular with no gallops, positive systolic murmur at the right lower sternal border, abdomen protuberant, but not distended or tender, positive lower extremity edema +++, more left than right, also noted upper extremities edema.   Na 138, K 3,3 Cl 89, bicarbonate 35 glucose 115 bun 68 cr 2.16  BNP 2,920  High sensitive troponin 63 and 62  Wbc 7,6 hgb 9,7 plt 228  Urine analysis SG 1,008, negative protein, moderate leukocytes and moderate hgb, wbc 6-10 and rbc 6-10   Chest radiograph with hypoinflation, bilateral hilar vascular congestion, with bilateral basal atelectasis, pacemaker in place with right atrial and right ventricular lead in place.   EKG 102 bpm, right axis deviation, right bundle branch block, qtc 602, sinus rhythm with 1st degree AV block, ventricular paced rhythm with no significant ST segment or T wave changes, poor RR wave progression,   Placed on IV furosemide  for diuresis.   09/09 not responding well to diuresis, she has advanced right heart failure, discussed with cardiology.  Consulted palliative care

## 2023-09-15 NOTE — Consult Note (Signed)
 WOC Nurse Consult Note: patient is known to WOC team from previous admissions with unstageable sacral wound; utilized Medihoney last admission and now presents as a Stage 3  Reason for Consult: sacral wound  Wound type: 1.  Stage 3 Pressure Injury sacrum  2.  Full thickness L knee unknown etiology Pressure Injury POA: Yes Measurement: see nursing flowsheet  Wound bed: 1.  Sacral wound 50% tan 50% pink  2  L knee 100% red moist  Drainage (amount, consistency, odor) see nursing flowsheet  Periwound: intact  Dressing procedure/placement/frequency: Cleanse sacral wound with NS, Apply Medihoney to wound bed daily, using a Q tip applicator fill in wound with dry gauze then secure with silicone foam.  2.  Cleanse L knee wound with NS, apply silver hydrofiber ([employer] Gerlean #[id number]) cut to fit wound bed daily and secure with silicone foam.   Plan of care discussed with bedside nurse. WOC team will not follow.  Re-consult if further needs arise.   Thank you,    Powell Bar MSN, RN-BC, [employer]

## 2023-09-15 NOTE — Assessment & Plan Note (Signed)
 Continue blood pressure support with midodrine.

## 2023-09-15 NOTE — Assessment & Plan Note (Signed)
 Cell count has been stable.

## 2023-09-15 NOTE — Assessment & Plan Note (Signed)
 Hypokalemia, hyponatremia, AKI   Renal function with serum cr at 2,18 with K at 3,3 and serum bicarbonate at 28  Na 133 Mg 1,7   Add 40 meq kcl and 2 g mag sulfate.  Follow up renal function and electrolytes in am.  Continue aggressive diuresis.

## 2023-09-15 NOTE — Assessment & Plan Note (Signed)
 Continue mesalamine , no signs of acute flare.

## 2023-09-15 NOTE — Assessment & Plan Note (Signed)
Calculated BMI is 32.8

## 2023-09-15 NOTE — Progress Notes (Signed)
 Progress Note   Patient: Felicia Benson FMW:[id number] DOB: September 18, 1930 DOA: 09/14/2023     1 DOS: the patient was seen and examined on 09/15/2023   Brief hospital course: Mrs. Oesterling was admitted to the hospital with the working diagnosis of acute heart failure exacerbation.   89 yo female with the past medical history of heart failure, pulmonary hypertension, coronary artery disease, heart block sp pacemaker, COPD, CKD, DVT/PE, and Crohn's disease who presented with lower extremity edema and weight gain.  She had worsening symptoms despite aggressive oral diuretic at the SNF.  On her initial physical examination her blood pressure was 109/78, HR 102 RR 25 and 02 saturation 89%  Lungs with bilateral rales and decreased inspiratory effort, heart with S1 and S2 present and regular with no gallops, positive systolic murmur at the right lower sternal border, abdomen protuberant, but not distended or tender, positive lower extremity edema +++, more left than right, also noted upper extremities edema.   Na 138, K 3,3 Cl 89, bicarbonate 35 glucose 115 bun 68 cr 2.16  BNP 2,920  High sensitive troponin 63 and 62  Wbc 7,6 hgb 9,7 plt 228  Urine analysis SG 1,008, negative protein, moderate leukocytes and moderate hgb, wbc 6-10 and rbc 6-10   Chest radiograph with hypoinflation, bilateral hilar vascular congestion, with bilateral basal atelectasis, pacemaker in place with right atrial and right ventricular lead in place.   EKG 102 bpm, right axis deviation, right bundle branch block, qtc 602, sinus rhythm with 1st degree AV block, ventricular paced rhythm with no significant ST segment or T wave changes, poor RR wave progression,   Assessment and Plan: * Acute on chronic diastolic heart failure (HCC) 07/2023 echocardiogram with mild reduction in LV systolic function 45 to 50%, global hypokinesis, severe asymmetric left ventricular hypertrophy of the septal segment, normal size left and right atriums, no  pericardial effusion,  interventricular septum is flattened in systole and diastole, mild reduction RV systolic function, moderate to severe mitral valve regurgitation, moderate tricuspid valve regurgitation, mild aortic regurgitation.   Acute on chronic cor pulmonale Pulmonary hypertension  RV failure.   Urine output documented at 200 cc probably not accurate. Systolic blood pressure 100 mmHg range.   Plan to continue aggressive diuresis with furosemide  80 mg IV bid Limited medical therapy due to risk of hypotension and reduced GFR Continue midodrine  for blood pressure support.   Essential hypertension Continue blood pressure support with midodrine    CAD S/P percutaneous coronary angioplasty No chest pain, no acute coronary syndrome,  Elevation of high sensitive troponin due to heart failure exacerbation  Continue aspirin  and statin   Chronic kidney disease, stage 3b (HCC) Hypokalemia, AKI   Renal function with serum cr at 2,0 with K at 3,2 and serum bicarbonate at 32  Na 135 Mg 1,8  Continue K and mag correction  Follow up renal function and electrolytes in am.  Avoid hypotension and nephrotoxic medications.   COPD (chronic obstructive pulmonary disease) (HCC) No signs of acute exacerbation Continue supplemental 02 per [place] Continue bronchodilator   Hyperlipidemia Continue with statin therapy   GERD (gastroesophageal reflux disease) Continue proton pump inhibitor  Crohn's disease (HCC) Continue mesalamine , no signs of acute flare.   OSA (obstructive sleep apnea) Cpap   Anemia of chronic disease Cell count has been stable   History of pulmonary embolism Sp IV filter, not on oral anticoagulation   Macular degeneration Outpatient follow up   Obesity, class 1 Calculated BMI is 32.8  Subjective: patient with improvement in dyspnea but still not back to baseline, continue to have lower extremity edema. At SNF with poor mobility   Physical  Exam: Vitals:   09/15/23 0100 09/15/23 0520 09/15/23 0709 09/15/23 1222  BP: 115/87 101/71 93/62 120/79  Pulse: 96 85 95   Resp: 16 15 18    Temp:  98.2 F (36.8 C) 97.7 F (36.5 C) 98.5 F (36.9 C)  TempSrc: Oral Oral Oral Oral  SpO2: 91% 91% 90%   Weight: 92.2 kg     Height: 5' 6 (1.676 m)      Neurology awake and alert, deconditioned ENT with mild pallor with no icterus Cardiovascular with S1 and S2 present, regular with no gallops, no rubs, positive systolic murmur at the right lower sternal border  Respiratory anterior auscultation with poor inspiratory effort, positive rales bilaterally with no wheezing or rhonchi  Abdomen protuberant, non tender Positive lower extremity edema ++  Data Reviewed:   Family Communication: no family at the bedside   Disposition: Status is: Inpatient Remains inpatient appropriate because: diuresis   Planned Discharge Destination: Skilled nursing facility    Author: Elidia Toribio Furnace, MD 09/15/2023 3:44 PM  For on call review [email or web address].

## 2023-09-15 NOTE — Assessment & Plan Note (Signed)
 07/2023 echocardiogram with mild reduction in LV systolic function 45 to 50%, global hypokinesis, severe asymmetric left ventricular hypertrophy of the septal segment, normal size left and right atriums, no pericardial effusion,  interventricular septum is flattened in systole and diastole, mild reduction RV systolic function, moderate to severe mitral valve regurgitation, moderate tricuspid valve regurgitation, mild aortic regurgitation.   Acute on chronic cor pulmonale Pulmonary hypertension  RV failure.   Urine output documented at 970 cc probably not accurate. Weight unchanged to 92 Kg.  Systolic blood pressure 110 mmHg range.   Plan to continue aggressive diuresis with furosemide  80 mg IV bid To consider thiazide diuretic with metolazone   Limited medical therapy due to risk of hypotension and reduced GFR Continue midodrine  for blood pressure support.

## 2023-09-15 NOTE — Plan of Care (Signed)
  Problem: Education: Goal: Knowledge of General Education information will improve Description: Including pain rating scale, medication(s)/side effects and non-pharmacologic comfort measures Outcome: Progressing   Problem: Clinical Measurements: Goal: Respiratory complications will improve Outcome: Progressing   Problem: Clinical Measurements: Goal: Cardiovascular complication will be avoided Outcome: Progressing   Problem: Skin Integrity: Goal: Risk for impaired skin integrity will decrease Outcome: Progressing

## 2023-09-16 DIAGNOSIS — I10 Essential (primary) hypertension: Secondary | ICD-10-CM | POA: Diagnosis not present

## 2023-09-16 DIAGNOSIS — I5033 Acute on chronic diastolic (congestive) heart failure: Secondary | ICD-10-CM | POA: Diagnosis not present

## 2023-09-16 DIAGNOSIS — N1832 Chronic kidney disease, stage 3b: Secondary | ICD-10-CM | POA: Diagnosis not present

## 2023-09-16 DIAGNOSIS — I2510 Atherosclerotic heart disease of native coronary artery without angina pectoris: Secondary | ICD-10-CM | POA: Diagnosis not present

## 2023-09-16 LAB — BASIC METABOLIC PANEL WITH GFR
Anion gap: 17 — ABNORMAL HIGH (ref 5–15)
BUN: 72 mg/dL — ABNORMAL HIGH (ref 8–23)
CO2: 28 mmol/L (ref 22–32)
Calcium: 8.3 mg/dL — ABNORMAL LOW (ref 8.9–10.3)
Chloride: 88 mmol/L — ABNORMAL LOW (ref 98–111)
Creatinine, Ser: 2.18 mg/dL — ABNORMAL HIGH (ref 0.44–1.00)
GFR, Estimated: 21 mL/min — ABNORMAL LOW (ref >60)
Glucose, Bld: 130 mg/dL — ABNORMAL HIGH (ref 70–99)
Potassium: 3.3 mmol/L — ABNORMAL LOW (ref 3.5–5.1)
Sodium: 133 mmol/L — ABNORMAL LOW (ref 135–145)

## 2023-09-16 LAB — MAGNESIUM: Magnesium: 1.7 mg/dL (ref 1.7–2.4)

## 2023-09-16 MED ORDER — POTASSIUM CHLORIDE CRYS ER 20 MEQ PO TBCR
40.0000 meq | EXTENDED_RELEASE_TABLET | Freq: Once | ORAL | Status: AC
  Administered 2023-09-16: 40 meq via ORAL
  Filled 2023-09-16: qty 2

## 2023-09-16 MED ORDER — MAGNESIUM SULFATE 2 GM/50ML IV SOLN
2.0000 g | Freq: Once | INTRAVENOUS | Status: AC
  Administered 2023-09-16: 2 g via INTRAVENOUS
  Filled 2023-09-16: qty 50

## 2023-09-16 MED ORDER — SALINE SPRAY 0.65 % NA SOLN
1.0000 | NASAL | Status: DC | PRN
  Filled 2023-09-16: qty 44

## 2023-09-16 NOTE — Progress Notes (Addendum)
 PT Cancellation Note  Patient Details Name: Felicia Benson MRN: [id number] DOB: [birth year]   Cancelled Treatment:    Reason Eval/Treat Not Completed: Patient declined, no reason specified (pt reports she is working on a bm in bed and would not be willing to mobilize for at least an hour)  2nd attempt at pt requested time and she refused stating need to each lunch and get cleaned up. States she will get up with nursing later. Agreeable to therapy attempt next date.   Lenoard NOVAK Emmilia Sowder 09/16/2023, 11:18 AM Lenoard SQUIBB, PT Acute Rehabilitation Services Office: [phone]

## 2023-09-16 NOTE — NC FL2 (Signed)
 [place]  MEDICAID FL2 LEVEL OF CARE FORM     IDENTIFICATION  Patient Name: Felicia Benson Birthdate: 27-Mar-1930 Sex: female Admission Date (Current Location): 09/14/2023  [facility] and [place] Number:  Producer, television/film/video and Address:  [facility], [address], [place], [place] [address]      Provider Number: [id number]  Attending Physician Name and Address:  Noralee Elidia Sieving,*  Relative Name and Phone Number:  [name]; Daughter; [phone]    Current Level of Care: Hospital Recommended Level of Care: Skilled Nursing Facility Prior Approval Number:    Date Approved/Denied:   PASRR Number: [id number]  Discharge Plan: SNF    Current Diagnoses: Patient Active Problem List   Diagnosis Date Noted   Pressure injury of skin 09/15/2023   History of pulmonary embolism 08/18/2023   Obesity, class 1 08/18/2023   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, 41-49%) (HCC) 08/17/2023   Hypoxic episode 07/23/2023   Acute on chronic respiratory failure with hypoxemia (HCC) 06/25/2023   Myocardial injury 04/08/2023   Sacral ulcer (HCC) 04/08/2023   Hypokalemia 04/08/2023   Goals of care, counseling/discussion 04/08/2023   Acute renal failure superimposed on stage 3b chronic kidney disease (HCC) 03/13/2023   Chronic respiratory failure with hypoxia (HCC) 03/08/2023   Elevated troponin 03/08/2023   Acute exacerbation of CHF (congestive heart failure) (HCC) 03/07/2023   Right ventricular failure (HCC) 02/15/2023   Shortness of breath 02/13/2023   DVT (deep venous thrombosis) (HCC) 02/11/2023   Acute pulmonary embolism (HCC) 02/08/2023   Acute respiratory failure with hypoxia (HCC) 02/08/2023   CAP (community acquired pneumonia) 02/07/2023   Filling defect on imaging study 02/07/2023   CKD (chronic kidney disease) 02/07/2023   Diverticular hemorrhage 12/28/2022   Ileus, postoperative (HCC) 11/04/2022    Closed right hip fracture (HCC) 10/28/2022   Fall at home, initial encounter 10/28/2022   History of COPD 10/28/2022   Allergic rhinitis 10/28/2022   Anemia of chronic disease 10/28/2022   Chronic rhinitis 08/06/2022   OSA (obstructive sleep apnea) 08/06/2022   Heart failure, acute diastolic (HCC) 07/21/2022   Chronic kidney disease, stage 3b (HCC) 07/21/2022   Thoracic aortic aneurysm (HCC) 07/21/2022   GERD (gastroesophageal reflux disease) 07/21/2022   Pancreatic lesion 07/21/2022   Macular degeneration 07/21/2022   Hypocalcemia 07/21/2022   Insomnia 07/21/2022   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   TIA (transient ischemic attack) 12/31/2020   Acute on chronic diastolic heart failure (HCC) 05/19/2019   Acute CHF (congestive heart failure) (HCC) 05/18/2019   Chronic hypoxic respiratory failure (HCC) 05/18/2019   COPD (chronic obstructive pulmonary disease) (HCC) 05/18/2019   History of cardiac pacemaker 05/18/2019   AAA (abdominal aortic aneurysm) 05/18/2019   Hyperlipidemia 05/18/2019   Abdominal pain in female 05/30/2015   Essential hypertension 05/30/2015   Crohn's disease (HCC) 05/30/2015   Gastrointestinal hemorrhage with melena 05/30/2015   Blood loss anemia 05/30/2015   Hyponatremia 05/30/2015   Kidney disease 05/30/2015   Normocytic anemia 05/30/2015   CAD S/P percutaneous coronary angioplasty 05/30/2015   Acute blood loss anemia 05/30/2015   Heme + stool     Orientation RESPIRATION BLADDER Height & Weight     Self, Time, Situation, Place  O2 (6L O2) Incontinent, External catheter Weight: 204 lb 9.4 oz (92.8 kg) Height:  5' 6 (167.6 cm)  BEHAVIORAL SYMPTOMS/MOOD NEUROLOGICAL BOWEL NUTRITION STATUS      Incontinent Diet (Please see discharge summary)  AMBULATORY STATUS  COMMUNICATION OF NEEDS Skin     Verbally Other (Comment), PU Stage and Appropriate Care (Wound other (Comment) Knee Left;Medial; Pressure Injury Coccyx Mid Stage 3)                        Personal Care Assistance Level of Assistance  Bathing, Dressing, Feeding Bathing Assistance: Maximum assistance Feeding assistance: Maximum assistance Dressing Assistance: Maximum assistance     Functional Limitations Info  Sight, Hearing, Speech Sight Info: Impaired (R and L (Blind)) Hearing Info: Impaired (R and L) Speech Info: Adequate    SPECIAL CARE FACTORS FREQUENCY  PT (By licensed PT), OT (By licensed OT)     PT Frequency: 5x week OT Frequency: 5x week            Contractures Contractures Info: Not present    Additional Factors Info  Code Status, Allergies Code Status Info: DNR-LIMITED -Do Not Intubate/DNI Allergies Info: Motrin (ibuprofen); Ace Inhibitors; Breztri  Aerosphere (budeson-glycopyrrol-formoterol ); Cipro (ciprofloxacin Hcl); Levaquin (levofloxacin); Nsaids           Current Medications (09/16/2023):  This is the current hospital active medication list Current Facility-Administered Medications  Medication Dose Route Frequency Provider Last Rate Last Admin   acetaminophen  (TYLENOL ) tablet 650 mg  650 mg Oral Q6H PRN Arrien, Elidia Sieving, MD       acetaminophen  (TYLENOL ) tablet 650 mg  650 mg Oral QHS Arrien, Mauricio Daniel, MD   650 mg at 09/15/23 2135   aspirin  EC tablet 81 mg  81 mg Oral Daily Segars, Jonathan, MD   81 mg at 09/16/23 9171   atorvastatin  (LIPITOR) tablet 40 mg  40 mg Oral QHS Segars, Dorn, MD       diphenhydrAMINE  (BENADRYL ) capsule 25 mg  25 mg Oral QHS Arrien, Mauricio Daniel, MD   25 mg at 09/15/23 2135   docusate sodium  (COLACE) capsule 200 mg  200 mg Oral Daily Arrien, Elidia Sieving, MD   200 mg at 09/16/23 9171   fluticasone  furoate-vilanterol (BREO ELLIPTA ) 200-25 MCG/ACT 1 puff  1 puff Inhalation Daily Segars, Jonathan, MD   1 puff at 09/16/23 9170   furosemide  (LASIX ) injection 80 mg  80 mg Intravenous BID Segars, Jonathan, MD   80 mg at 09/16/23 9170   guaiFENesin -dextromethorphan  (ROBITUSSIN DM) 100-10 MG/5ML  syrup 5 mL  5 mL Oral Q4H PRN Arrien, Elidia Sieving, MD   5 mL at 09/15/23 1544   ipratropium-albuterol  (DUONEB) 0.5-2.5 (3) MG/3ML nebulizer solution 3 mL  3 mL Nebulization Q6H PRN Keturah Dorn, MD       leptospermum manuka honey (MEDIHONEY) paste 1 Application  1 Application Topical Daily Arrien, Elidia Sieving, MD   1 Application at 09/16/23 9050   loratadine  (CLARITIN ) tablet 10 mg  10 mg Oral Daily PRN Segars, Jonathan, MD   10 mg at 09/15/23 1208   mesalamine  (PENTASA ) CR capsule 1,000 mg  1,000 mg Oral BID Segars, Jonathan, MD   1,000 mg at 09/16/23 9171   midodrine  (PROAMATINE ) tablet 5 mg  5 mg Oral BID WC Segars, Jonathan, MD   5 mg at 09/16/23 9171   montelukast  (SINGULAIR ) tablet 10 mg  10 mg Oral Daily Segars, Jonathan, MD   10 mg at 09/16/23 9171   ondansetron  (ZOFRAN ) injection 4 mg  4 mg Intravenous Q6H PRN Segars, Jonathan, MD   4 mg at 09/15/23 1024   pantoprazole  (PROTONIX ) EC tablet 40 mg  40 mg Oral BID Segars, Jonathan, MD   40 mg  at 09/16/23 0659   polyethylene glycol (MIRALAX  / GLYCOLAX ) packet 17 g  17 g Oral BID PRN Segars, Jonathan, MD   17 g at 09/16/23 0948   sodium chloride  flush (NS) 0.9 % injection 3 mL  3 mL Intravenous Q12H Segars, Jonathan, MD   3 mL at 09/16/23 9051     Discharge Medications: Please see discharge summary for a list of discharge medications.  Relevant Imaging Results:  Relevant Lab Results:   Additional Information [id number]  Kiaan Overholser C Shylynn Bruning, LCSWA

## 2023-09-16 NOTE — TOC Initial Note (Signed)
 Transition of Care [facility]) - Initial/Assessment Note    Patient Details  Name: Felicia Benson MRN: [id number] Date of Birth: December 24, 1930  Transition of Care [facility]) CM/SW Contact:    Luise JAYSON Pan, LCSWA Phone Number: 09/16/2023, 10:03 AM  Clinical Narrative:     Patient is from [facility]. Patient's baseline O2 is 6L.   Per [facility], patient can return when medically ready. CSW spoke with patients daughter, [name], about patient returning to [facility] when medically ready. [name] is agreeable.   CSW will continue to follow.          Expected Discharge Plan: Long Term Nursing Home Barriers to Discharge: Continued Medical Work up   Patient Goals and CMS Choice Patient states their goals for this hospitalization and ongoing recovery are:: To return to LTC          Expected Discharge Plan and Services In-house Referral: Clinical Social Work   Post Acute Care Choice: Skilled Nursing Facility Living arrangements for the past 2 months: Skilled Nursing Facility                                      Prior Living Arrangements/Services Living arrangements for the past 2 months: Skilled Nursing Facility Lives with:: Facility Resident Patient language and need for interpreter reviewed:: Yes Do you feel safe going back to the place where you live?: Yes      Need for Family Participation in Patient Care: No (Comment) (From a LTC) Care giver support system in place?: Yes (comment)   Criminal Activity/Legal Involvement Pertinent to Current Situation/Hospitalization: No - Comment as needed  Activities of Daily Living   ADL Screening (condition at time of admission) Independently performs ADLs?: No Does the patient have a NEW difficulty with bathing/dressing/toileting/self-feeding that is expected to last >3 days?: No Does the patient have a NEW difficulty with getting in/out of bed, walking, or climbing stairs that is expected to last >3 days?: No Does the patient have a  NEW difficulty with communication that is expected to last >3 days?: No Is the patient deaf or have difficulty hearing?: No Does the patient have difficulty seeing, even when wearing glasses/contacts?: No Does the patient have difficulty concentrating, remembering, or making decisions?: No  Permission Sought/Granted Permission sought to share information with : Facility Medical sales representative, Family Supports Permission granted to share information with : No Environmental manager info on chart)  Share Information with NAME: [name]  Permission granted to share info w AGENCY: [facility]  Permission granted to share info w Relationship: Daughter  Permission granted to share info w Contact Information: [phone]  Emotional Assessment Appearance:: Appears stated age Attitude/Demeanor/Rapport: Engaged Affect (typically observed): Pleasant Orientation: : Oriented to Place, Oriented to Self, Oriented to  Time, Oriented to Situation Alcohol  / Substance Use: Not Applicable Psych Involvement: No (comment)  Admission diagnosis:  Acute exacerbation of CHF (congestive heart failure) (HCC) [I50.9] Acute on chronic systolic congestive heart failure (HCC) [I50.23] Patient Active Problem List   Diagnosis Date Noted   Pressure injury of skin 09/15/2023   History of pulmonary embolism 08/18/2023   Obesity, class 1 08/18/2023   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, 41-49%) (HCC) 08/17/2023   Hypoxic episode 07/23/2023   Acute on chronic respiratory failure with hypoxemia (HCC) 06/25/2023   Myocardial injury 04/08/2023   Sacral ulcer (HCC) 04/08/2023   Hypokalemia 04/08/2023  Goals of care, counseling/discussion 04/08/2023   Acute renal failure superimposed on stage 3b chronic kidney disease (HCC) 03/13/2023   Chronic respiratory failure with hypoxia (HCC) 03/08/2023   Elevated troponin 03/08/2023   Acute exacerbation of CHF (congestive heart failure)  (HCC) 03/07/2023   Right ventricular failure (HCC) 02/15/2023   Shortness of breath 02/13/2023   DVT (deep venous thrombosis) (HCC) 02/11/2023   Acute pulmonary embolism (HCC) 02/08/2023   Acute respiratory failure with hypoxia (HCC) 02/08/2023   CAP (community acquired pneumonia) 02/07/2023   Filling defect on imaging study 02/07/2023   CKD (chronic kidney disease) 02/07/2023   Diverticular hemorrhage 12/28/2022   Ileus, postoperative (HCC) 11/04/2022   Closed right hip fracture (HCC) 10/28/2022   Fall at home, initial encounter 10/28/2022   History of COPD 10/28/2022   Allergic rhinitis 10/28/2022   Anemia of chronic disease 10/28/2022   Chronic rhinitis 08/06/2022   OSA (obstructive sleep apnea) 08/06/2022   Heart failure, acute diastolic (HCC) 07/21/2022   Chronic kidney disease, stage 3b (HCC) 07/21/2022   Thoracic aortic aneurysm (HCC) 07/21/2022   GERD (gastroesophageal reflux disease) 07/21/2022   Pancreatic lesion 07/21/2022   Macular degeneration 07/21/2022   Hypocalcemia 07/21/2022   Insomnia 07/21/2022   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   TIA (transient ischemic attack) 12/31/2020   Acute on chronic diastolic heart failure (HCC) 05/19/2019   Acute CHF (congestive heart failure) (HCC) 05/18/2019   Chronic hypoxic respiratory failure (HCC) 05/18/2019   COPD (chronic obstructive pulmonary disease) (HCC) 05/18/2019   History of cardiac pacemaker 05/18/2019   AAA (abdominal aortic aneurysm) 05/18/2019   Hyperlipidemia 05/18/2019   Abdominal pain in female 05/30/2015   Essential hypertension 05/30/2015   Crohn's disease (HCC) 05/30/2015   Gastrointestinal hemorrhage with melena 05/30/2015   Blood loss anemia 05/30/2015   Hyponatremia 05/30/2015   Kidney disease 05/30/2015   Normocytic anemia 05/30/2015   CAD S/P percutaneous coronary angioplasty 05/30/2015   Acute blood loss anemia 05/30/2015   Heme + stool    PCP:  [facility]:   [facility] [address], [place] - [address] [place] [address] Phone: [phone] Fax: [phone]  [facility] - [place], [place] - [address] [place] [address] Phone: [phone] Fax: [phone]     Social Drivers of Health (SDOH) Social History: SDOH Screenings   Food Insecurity: No Food Insecurity (09/15/2023)  Housing: Low Risk  (09/15/2023)  Transportation Needs: No Transportation Needs (09/15/2023)  Utilities: Not At Risk (09/15/2023)  Social Connections: Moderately Integrated (09/15/2023)  Recent Concern: Social Connections - Moderately Isolated (07/24/2023)  Tobacco Use: Medium Risk (09/14/2023)   SDOH Interventions:     Readmission Risk Interventions    07/26/2023   11:43 AM 07/24/2022    2:49 PM  Readmission Risk Prevention Plan  Transportation Screening Complete Complete  PCP or Specialist Appt within 5-7 Days  Complete  Home Care Screening  Complete  Medication Review (RN CM)  Complete  Medication Review (RN Care Manager) Complete   PCP or Specialist appointment within 3-5 days of discharge Complete   HRI or Home Care Consult Complete   Palliative Care Screening Not Applicable   Skilled Nursing Facility Not Applicable

## 2023-09-16 NOTE — Progress Notes (Signed)
 Heart Failure Navigator Progress Note  Assessed for Heart & Vascular TOC clinic readiness.  Patient does not meet criteria due to No HF TOC per Dr. Arrien, patient has a scheduled [facility] appointment on 09/30/2023. .   Navigator will sign off at this time.   [name] Haddock, BSN, Scientist, clinical (histocompatibility and immunogenetics) Only

## 2023-09-16 NOTE — Plan of Care (Signed)
   Problem: Education: Goal: Knowledge of General Education information will improve Description: Including pain rating scale, medication(s)/side effects and non-pharmacologic comfort measures Outcome: Progressing   Problem: Clinical Measurements: Goal: Will remain free from infection Outcome: Progressing   Problem: Clinical Measurements: Goal: Diagnostic test results will improve Outcome: Progressing

## 2023-09-16 NOTE — Progress Notes (Signed)
 Rounding Note   Patient Name: Felicia Benson Date of Encounter: 09/16/2023  [facility] Cardiologist: Annabella Scarce, MD   Subjective Pt breathing is some better   Still has fluid she says  Denies CP     Scheduled Meds:  acetaminophen   650 mg Oral QHS   aspirin  EC  81 mg Oral Daily   atorvastatin   40 mg Oral QHS   diphenhydrAMINE   25 mg Oral QHS   docusate sodium   200 mg Oral Daily   fluticasone  furoate-vilanterol  1 puff Inhalation Daily   furosemide   80 mg Intravenous BID   leptospermum manuka honey  1 Application Topical Daily   mesalamine   1,000 mg Oral BID   midodrine   5 mg Oral BID WC   montelukast   10 mg Oral Daily   pantoprazole   40 mg Oral BID   sodium chloride  flush  3 mL Intravenous Q12H   Continuous Infusions:  PRN Meds: acetaminophen , guaiFENesin -dextromethorphan , ipratropium-albuterol , loratadine , ondansetron  (ZOFRAN ) IV, polyethylene glycol   Vital Signs  Vitals:   09/15/23 1735 09/15/23 1950 09/16/23 0050 09/16/23 0455  BP:  98/73 (!) 96/55 118/86  Pulse:  91 88 87  Resp:  20 20 16   Temp: 97.8 F (36.6 C) 98.1 F (36.7 C) 98 F (36.7 C) (!) 97.4 F (36.3 C)  TempSrc: Oral Oral Oral Oral  SpO2:  91% 96% 92%  Weight:    92.8 kg  Height:        Intake/Output Summary (Last 24 hours) at 09/16/2023 0737 Last data filed at 09/16/2023 0700 Gross per 24 hour  Intake 508 ml  Output 970 ml  Net -462 ml      09/16/2023    4:55 AM 09/15/2023    1:00 AM 08/26/2023    6:09 AM  Last 3 Weights  Weight (lbs) 204 lb 9.4 oz 203 lb 4.2 oz 187 lb 9.8 oz  Weight (kg) 92.8 kg 92.2 kg 85.1 kg      Telemetry SR  Paced  - Personally Reviewed  ECG  No new  - Personally Reviewed  Physical Exam  GEN: Obese 89 yo inno acute distress.   Neck: JVP increased Cardiac: RRR  II/VI systolic murmur LSB Respiratory: REl clear  GI: Soft, nontender, non-distended  Ext   1+ LE edema   Pneumatic compression sleeves on Labs High Sensitivity Troponin:   Recent  Labs  Lab 08/17/23 1953 08/17/23 2139 09/14/23 2125 09/14/23 2339  TROPONINIHS 47* 49* 63* 62*     Chemistry Recent Labs  Lab 09/14/23 2125 09/15/23 0225  NA 138 135  K 3.3* 3.2*  CL 89* 88*  CO2 35* 32  GLUCOSE 115* 91  BUN 68* 67*  CREATININE 2.16* 2.03*  CALCIUM  8.4* 8.5*  MG  --  1.8  PROT 6.5  --   ALBUMIN  2.6*  --   AST 27  --   ALT 21  --   ALKPHOS 83  --   BILITOT 0.7  --   GFRNONAA 21* 23*  ANIONGAP 14 15    Lipids No results for input(s): CHOL, TRIG, HDL, LABVLDL, LDLCALC, CHOLHDL in the last 168 hours.  Hematology Recent Labs  Lab 09/14/23 2125  WBC 7.6  RBC 3.44*  HGB 9.7*  HCT 31.4*  MCV 91.3  MCH 28.2  MCHC 30.9  RDW 17.9*  PLT 228   Thyroid  Recent Labs  Lab 09/15/23 0225  TSH 1.019    BNP Recent Labs  Lab 09/14/23 2125  BNP  2,920.5*    DDimer No results for input(s): DDIMER in the last 168 hours.   Radiology  DG Chest Portable 1 View Result Date: 09/14/2023 CLINICAL DATA:  Shortness of breath, CHF EXAM: PORTABLE CHEST 1 VIEW COMPARISON:  08/17/2023 FINDINGS: Single frontal view of the chest demonstrates an enlarged cardiac silhouette, stable. Dual lead pacemaker is unchanged. There is increased central pulmonary vascular congestion, basilar predominant interstitial and ground-glass opacities compatible with edema. Small bilateral pleural effusions, left greater than right. No pneumothorax. IMPRESSION: 1. Moderate congestive heart failure, with worsening volume status since prior exam. Electronically Signed   By: Ozell Daring M.D.   On: 09/14/2023 20:15    Cardiac Studies  Echo  July 2025 1. Left ventricular ejection fraction, by estimation, is 45 to 50%. The  left ventricle has mildly decreased function. The left ventricle  demonstrates global hypokinesis. There is severe asymmetric left  ventricular hypertrophy of the septal segment.  There is the interventricular septum is flattened in systole and diastole,   consistent with right ventricular pressure and volume overload.   2. Right ventricular systolic function is mildly reduced. The right  ventricular size is moderately enlarged.   3. The mitral valve is normal in structure. Moderate to severe mitral  valve regurgitation. No evidence of mitral stenosis.   4. Tricuspid valve regurgitation is moderate.   5. The aortic valve is normal in structure. Aortic valve regurgitation is  mild. No aortic stenosis is present.   6. The inferior vena cava is normal in size with greater than 50%  respiratory variability, suggesting right atrial pressure of 3 mmHg.   Patient Profile   89 y.o. 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), COPD (on 5 L O2), asked to see for CHF  Assessment & Plan   1  CHF  I have reviewed echo from July 2025   LVEF appears low normal   RVEF is severely reduced     THere is septal flattening  There is at least moderate TR  IVC dilated  Overall consistent with cor pulmonale from severe COPD    IT may be difficult to diurese to point of no LE edema  ANd most likely will recur  May need to find balance  Keep on IV lasix  for now   2  Hypotension   SBP 96 to 118 today   3 Tricuspid regurgitation  At least moderate on recent echo    4  CHB  s/p PPM July 2024  5  COPD   Pt with severe COPD  On 5-6 L O2 chronically     6   OSA  pt has CPAP  Daughter bringing   7  Hx DVT/PE   Pt has IVC filter Jan 2025    Not on anticoagulation due to GI bleeding / Crohns Continue intermitt pneumatic compresion     For questions or updates, please contact [facility] Please consult [email or web address] for contact info under     Signed, Vina Gull, MD  09/16/2023, 7:37 AM

## 2023-09-16 NOTE — Progress Notes (Signed)
 OT Cancellation Note  Patient Details Name: REE ALCALDE MRN: [id number] DOB: 1930-12-26   Cancelled Treatment:    Reason Eval/Treat Not Completed: Other (comment) Pt reports she is working on a bowel movement in bed and would not be willing to mobilize for at least an hour. Will try back as schedule allows.  Donny BECKER OT Acute Rehabilitation Services Office [phone]     [place] Dorothyann Distel 09/16/2023, 11:20 AM

## 2023-09-16 NOTE — Progress Notes (Signed)
 Progress Note   Patient: Felicia Benson FMW:[id number] DOB: 1930/04/06 DOA: 09/14/2023     2 DOS: the patient was seen and examined on 09/16/2023   Brief hospital course: Mrs. Gazzola was admitted to the hospital with the working diagnosis of acute heart failure exacerbation.   89 yo female with the past medical history of heart failure, pulmonary hypertension, coronary artery disease, heart block sp pacemaker, COPD, CKD, DVT/PE, and Crohn's disease who presented with lower extremity edema and weight gain.  She had worsening symptoms despite aggressive oral diuretic at the SNF.  On her initial physical examination her blood pressure was 109/78, HR 102 RR 25 and 02 saturation 89%  Lungs with bilateral rales and decreased inspiratory effort, heart with S1 and S2 present and regular with no gallops, positive systolic murmur at the right lower sternal border, abdomen protuberant, but not distended or tender, positive lower extremity edema +++, more left than right, also noted upper extremities edema.   Na 138, K 3,3 Cl 89, bicarbonate 35 glucose 115 bun 68 cr 2.16  BNP 2,920  High sensitive troponin 63 and 62  Wbc 7,6 hgb 9,7 plt 228  Urine analysis SG 1,008, negative protein, moderate leukocytes and moderate hgb, wbc 6-10 and rbc 6-10   Chest radiograph with hypoinflation, bilateral hilar vascular congestion, with bilateral basal atelectasis, pacemaker in place with right atrial and right ventricular lead in place.   EKG 102 bpm, right axis deviation, right bundle branch block, qtc 602, sinus rhythm with 1st degree AV block, ventricular paced rhythm with no significant ST segment or T wave changes, poor RR wave progression,   Placed on IV furosemide  for diuresis.   Assessment and Plan: * Acute on chronic diastolic heart failure (HCC) 07/2023 echocardiogram with mild reduction in LV systolic function 45 to 50%, global hypokinesis, severe asymmetric left ventricular hypertrophy of the septal  segment, normal size left and right atriums, no pericardial effusion,  interventricular septum is flattened in systole and diastole, mild reduction RV systolic function, moderate to severe mitral valve regurgitation, moderate tricuspid valve regurgitation, mild aortic regurgitation.   Acute on chronic cor pulmonale Pulmonary hypertension  RV failure.   Urine output documented at 970 cc probably not accurate. Weight unchanged to 92 Kg.  Systolic blood pressure 110 mmHg range.   Plan to continue aggressive diuresis with furosemide  80 mg IV bid To consider thiazide diuretic with metolazone   Limited medical therapy due to risk of hypotension and reduced GFR Continue midodrine  for blood pressure support.   Essential hypertension Continue blood pressure support with midodrine    CAD S/P percutaneous coronary angioplasty No chest pain, no acute coronary syndrome,  Elevation of high sensitive troponin due to heart failure exacerbation  Continue aspirin  and statin   Chronic kidney disease, stage 3b (HCC) Hypokalemia, hyponatremia, AKI   Renal function with serum cr at 2,18 with K at 3,3 and serum bicarbonate at 28  Na 133 Mg 1,7   Add 40 meq kcl and 2 g mag sulfate.  Follow up renal function and electrolytes in am.  Continue aggressive diuresis.   COPD (chronic obstructive pulmonary disease) (HCC) No signs of acute exacerbation Continue supplemental 02 per Dolores Continue bronchodilator   Hyperlipidemia Continue with statin therapy   GERD (gastroesophageal reflux disease) Continue proton pump inhibitor  Crohn's disease (HCC) Continue mesalamine , no signs of acute flare.   OSA (obstructive sleep apnea) Cpap   Anemia of chronic disease Cell count has been stable   History of  pulmonary embolism Sp IV filter, not on oral anticoagulation   Macular degeneration Outpatient follow up   Obesity, class 1 Calculated BMI is 32.8         Subjective: Patient with improvement on  dyspnea and edema but not yet back to baseline, no chest pain   Physical Exam: Vitals:   09/16/23 0050 09/16/23 0455 09/16/23 0720 09/16/23 1147  BP: (!) 96/55 118/86 108/68 115/78  Pulse: 88 87 83 83  Resp: 20 16 17 18   Temp: 98 F (36.7 C) (!) 97.4 F (36.3 C) (!) 97.5 F (36.4 C) 97.8 F (36.6 C)  TempSrc: Oral Oral Oral Oral  SpO2: 96% 92% 90% 92%  Weight:  92.8 kg    Height:       Neurology awake and alert, deconditioned  ENT with mild pallor Cardiovascular with S1 and S2 present and regular with no gallops or rubs, positive systolic murmur at the right lower sternal border.  Respiratory with poor inspiratory effort, with no wheezing or rhonchi, decreased breath sounds at bases, anterior auscultation  Abdomen protuberant, non tender to distended Lower extremity edema ++  Data Reviewed:    Family Communication: no family at the bedside   Disposition: Status is: Inpatient Remains inpatient appropriate because: IV diuresis   Planned Discharge Destination: Skilled nursing facility      Author: Elidia Toribio Furnace, MD 09/16/2023 4:02 PM  For on call review [email or web address].

## 2023-09-17 DIAGNOSIS — N1832 Chronic kidney disease, stage 3b: Secondary | ICD-10-CM | POA: Diagnosis not present

## 2023-09-17 DIAGNOSIS — I2510 Atherosclerotic heart disease of native coronary artery without angina pectoris: Secondary | ICD-10-CM | POA: Diagnosis not present

## 2023-09-17 DIAGNOSIS — J439 Emphysema, unspecified: Secondary | ICD-10-CM | POA: Diagnosis not present

## 2023-09-17 DIAGNOSIS — Z7189 Other specified counseling: Secondary | ICD-10-CM | POA: Diagnosis not present

## 2023-09-17 DIAGNOSIS — I10 Essential (primary) hypertension: Secondary | ICD-10-CM | POA: Diagnosis not present

## 2023-09-17 DIAGNOSIS — I5033 Acute on chronic diastolic (congestive) heart failure: Secondary | ICD-10-CM | POA: Diagnosis not present

## 2023-09-17 LAB — BASIC METABOLIC PANEL WITH GFR
Anion gap: 9 (ref 5–15)
Anion gap: 13 (ref 5–15)
BUN: 75 mg/dL — ABNORMAL HIGH (ref 8–23)
BUN: 76 mg/dL — ABNORMAL HIGH (ref 8–23)
CO2: 34 mmol/L — ABNORMAL HIGH (ref 22–32)
CO2: 31 mmol/L (ref 22–32)
Calcium: 8.1 mg/dL — ABNORMAL LOW (ref 8.9–10.3)
Calcium: 8.1 mg/dL — ABNORMAL LOW (ref 8.9–10.3)
Chloride: 88 mmol/L — ABNORMAL LOW (ref 98–111)
Chloride: 87 mmol/L — ABNORMAL LOW (ref 98–111)
Creatinine, Ser: 2.09 mg/dL — ABNORMAL HIGH (ref 0.44–1.00)
Creatinine, Ser: 1.98 mg/dL — ABNORMAL HIGH (ref 0.44–1.00)
GFR, Estimated: 22 mL/min — ABNORMAL LOW (ref >60)
GFR, Estimated: 23 mL/min — ABNORMAL LOW (ref >60)
Glucose, Bld: 86 mg/dL (ref 70–99)
Glucose, Bld: 127 mg/dL — ABNORMAL HIGH (ref 70–99)
Potassium: 3.7 mmol/L (ref 3.5–5.1)
Potassium: 3.5 mmol/L (ref 3.5–5.1)
Sodium: 131 mmol/L — ABNORMAL LOW (ref 135–145)
Sodium: 131 mmol/L — ABNORMAL LOW (ref 135–145)

## 2023-09-17 LAB — ECHOCARDIOGRAM LIMITED
Height: 66 in
S' Lateral: 3 cm
Weight: 2924.18 [oz_av]

## 2023-09-17 LAB — MRSA NEXT GEN BY PCR, NASAL: MRSA by PCR Next Gen: DETECTED — AB

## 2023-09-17 LAB — MAGNESIUM: Magnesium: 2.1 mg/dL (ref 1.7–2.4)

## 2023-09-17 MED ORDER — CHLORHEXIDINE GLUCONATE CLOTH 2 % EX PADS
6.0000 | MEDICATED_PAD | Freq: Every day | CUTANEOUS | Status: DC
  Administered 2023-09-17 – 2023-09-19 (×3): 6 via TOPICAL

## 2023-09-17 MED ORDER — METOLAZONE 5 MG PO TABS
5.0000 mg | ORAL_TABLET | Freq: Once | ORAL | Status: AC
  Administered 2023-09-17: 5 mg via ORAL
  Filled 2023-09-17: qty 1

## 2023-09-17 MED ORDER — MUPIROCIN 2 % EX OINT
1.0000 | TOPICAL_OINTMENT | Freq: Two times a day (BID) | CUTANEOUS | Status: AC
  Administered 2023-09-17 – 2023-09-21 (×10): 1 via NASAL
  Filled 2023-09-17 (×2): qty 22

## 2023-09-17 NOTE — Progress Notes (Signed)
 Rounding Note   Patient Name: Felicia Benson Date of Encounter: 09/17/2023  [facility] Cardiologist: Annabella Scarce, MD   Subjective Breathing OK in bed   On 8 L now   At center was on 5 or 6   Stats 88 to 97  (goes down when talking)  Scheduled Meds:  acetaminophen   650 mg Oral QHS   aspirin  EC  81 mg Oral Daily   atorvastatin   40 mg Oral QHS   diphenhydrAMINE   25 mg Oral QHS   docusate sodium   200 mg Oral Daily   fluticasone  furoate-vilanterol  1 puff Inhalation Daily   furosemide   80 mg Intravenous BID   leptospermum manuka honey  1 Application Topical Daily   mesalamine   1,000 mg Oral BID   midodrine   5 mg Oral BID WC   montelukast   10 mg Oral Daily   pantoprazole   40 mg Oral BID   sodium chloride  flush  3 mL Intravenous Q12H   Continuous Infusions:  PRN Meds: acetaminophen , guaiFENesin -dextromethorphan , ipratropium-albuterol , loratadine , ondansetron  (ZOFRAN ) IV, polyethylene glycol, sodium chloride    Vital Signs  Vitals:   09/16/23 1147 09/16/23 2035 09/17/23 0025 09/17/23 0515  BP: 115/78 120/77 101/69 102/63  Pulse: 83 89 89 84  Resp: 18 15 17 16   Temp: 97.8 F (36.6 C) (!) 97.5 F (36.4 C) (!) 97.5 F (36.4 C) (!) 97.3 F (36.3 C)  TempSrc: Oral Oral Oral Oral  SpO2: 92% 92% 93% 93%  Weight:    92.9 kg  Height:        Intake/Output Summary (Last 24 hours) at 09/17/2023 0656 Last data filed at 09/17/2023 9687 Gross per 24 hour  Intake 953 ml  Output 1250 ml  Net -297 ml      09/17/2023    5:15 AM 09/16/2023    4:55 AM 09/15/2023    1:00 AM  Last 3 Weights  Weight (lbs) 204 lb 12.9 oz 204 lb 9.4 oz 203 lb 4.2 oz  Weight (kg) 92.9 kg 92.8 kg 92.2 kg      Telemetry SR  Paced  I personally reviewed  ECG  No new  - Personally Reviewed  Physical Exam  GEN: Obese 89 yo inno acute distress.   Neck: Neck is full  Cardiac: RRR  II/VI systolic murmur LSB Respiratory:CTA   GI: Soft, nontender, non-distended  Ext   1+ LE edema   Pneumatic  compression sleeves on Labs High Sensitivity Troponin:   Recent Labs  Lab 09/14/23 2125 09/14/23 2339  TROPONINIHS 63* 62*     Chemistry Recent Labs  Lab 09/14/23 2125 09/15/23 0225 09/16/23 0911 09/17/23 0324  NA 138 135 133* 131*  K 3.3* 3.2* 3.3* 3.7  CL 89* 88* 88* 88*  CO2 35* 32 28 34*  GLUCOSE 115* 91 130* 86  BUN 68* 67* 72* 75*  CREATININE 2.16* 2.03* 2.18* 2.09*  CALCIUM  8.4* 8.5* 8.3* 8.1*  MG  --  1.8 1.7 2.1  PROT 6.5  --   --   --   ALBUMIN  2.6*  --   --   --   AST 27  --   --   --   ALT 21  --   --   --   ALKPHOS 83  --   --   --   BILITOT 0.7  --   --   --   GFRNONAA 21* 23* 21* 22*  ANIONGAP 14 15 17* 9    Lipids No results for  input(s): CHOL, TRIG, HDL, LABVLDL, LDLCALC, CHOLHDL in the last 168 hours.  Hematology Recent Labs  Lab 09/14/23 2125  WBC 7.6  RBC 3.44*  HGB 9.7*  HCT 31.4*  MCV 91.3  MCH 28.2  MCHC 30.9  RDW 17.9*  PLT 228   Thyroid  Recent Labs  Lab 09/15/23 0225  TSH 1.019    BNP Recent Labs  Lab 09/14/23 2125  BNP 2,920.5*    DDimer No results for input(s): DDIMER in the last 168 hours.   Radiology  No results found.   Cardiac Studies Echo   July 2025 (report amended Sept 2025)   1. Left ventricular ejection fraction, by estimation, is 45 to 50%. The  left ventricle has mildly decreased function. The left ventricle  demonstrates global hypokinesis. There is severe asymmetric left  ventricular hypertrophy of the septal segment.  There is the interventricular septum is flattened in systole and diastole,  consistent with right ventricular pressure and volume overload.   2. Right ventricular systolic function is severely reduced. The right  ventricular size is severely enlarged. There is severely elevated  pulmonary artery systolic pressure.   3. Right atrial size was mildly dilated.   4. The mitral valve is normal in structure. Moderate to severe mitral  valve regurgitation. No evidence of  mitral stenosis.   5. Tricuspid valve regurgitation is moderate to severe.   6. The aortic valve is normal in structure. Aortic valve regurgitation is  mild. No aortic stenosis is present.   7. The inferior vena cava is dilated in size with >50% respiratory  variability, suggesting right atrial pressure of 8 mmHg.    Patient Profile   89 y.o. 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), COPD (on 5 L O2), asked to see for CHF  Assessment & Plan   1  CHF  Pt with cor pulmonale and mild LV dysfunction    On review,of echo, RVEF is severely depressed LVEF mildly depressed,   Echo also shows mild MR and mod/severe TR  Since admit has gotten IV lasix  80 bid without much diuresis   Wt is unchanged   She feels some better  Labs: BUN increased  Cr stable from adimt though up from Aug  2025(1.2/1.4 to  2  --- now 2.05) Na 131 this am      Pt is  still on 8 L O2  (sats 88 to 97)     I have held AM lasix    Check BMET at noon    If Na, Cr stable I would give 5 Metolazone  and then 80 lasix  and follow response  Further rx with PICC/possible inotropes would only have temporary effect, not corrective   WOuld not pursue   2  Hypotension   SBP 99 to 102  Keep on midodrine    3 Tricuspid regurgitation Mod/severe   4  CHB  s/p PPM July 2024  5 REnal  BUN / CR 75/ 2.1     COPD   Pt with severe COPD Now on 8 L O2   Sats drop when talks   This is at rest   At center she says she is on 5-6 L [place]    6   OSA  pt has CPAP    7  Hx DVT/PE   Pt has IVC filter  Jan 2025    Not on anticoagulation due to GI bleeding / Crohns Continue intermitt pneumatic compresion  Pt in bed all day yesterday  Would try to get up to chair some, may oxygenate better.  Move legs around   For questions or updates, please contact [facility] Please consult [email or web address] for contact info  under     Signed, Vina Gull, MD  09/17/2023, 6:56 AM

## 2023-09-17 NOTE — Evaluation (Signed)
 Physical Therapy Evaluation Patient Details Name: Felicia Benson MRN: [id number] DOB: [birth year] Today's Date: 09/17/2023  History of Present Illness  89 y.o. female adm 09/14/23 from [facility] for LB edema, SOB and CHF exacerbation. PMHx: AAA, CHF, Crohn's Disease, GERD, GIB, HTN, OSA, CHB s/p PPM, DVT with IVC filter,  Rt femur fx s/p IM nail, COPD on 6L O2, HLD, CKD, CAD  Clinical Impression  Pt sitting with OT on arrival and willing to work toward transfers and limited gait. Pt with CGA-min assist to rise and pivot to chair as well as 8' limited gait on 8L with SPO2 88-94% on O2 throughout session. Pt walking up to 60' at baseline with assist and wants to return to PLOF. Pt with decreased activity tolerance, transfers and cardiopulmonary function who will benefit from acute therapy to maximize mobility and independence.         If plan is discharge home, recommend the following: A little help with walking and/or transfers;A lot of help with bathing/dressing/bathroom;Assistance with cooking/housework;Assist for transportation   Can travel by private vehicle   No    Equipment Recommendations None recommended by PT  Recommendations for Other Services       Functional Status Assessment Patient has had a recent decline in their functional status and/or demonstrates limited ability to make significant improvements in function in a reasonable and predictable amount of time     Precautions / Restrictions Precautions Precautions: Fall Recall of Precautions/Restrictions: Impaired Precaution/Restrictions Comments: 5-6 L O2 at baseline, low vision (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 Min assist to rise from recliner with cues for hand placement    Ambulation/Gait Ambulation/Gait assistance: Min assist, +2 safety/equipment Gait Distance (Feet): 8 Feet Assistive device: Rolling walker (2 wheels) Gait Pattern/deviations: Step-through pattern, Decreased stride length, Trunk flexed   Gait velocity interpretation: <1.8 ft/sec, indicate of risk for recurrent falls   General Gait Details: trunk flexed, chair follow with cues for sequence. Desaturation to 88% on 8L with limited gait  Stairs            Wheelchair Mobility     Tilt Bed    Modified Rankin (Stroke Patients Only)       Balance Overall balance assessment: Needs assistance Sitting-balance support: Feet supported, No upper extremity supported Sitting balance-Leahy Scale: Fair     Standing balance support: Bilateral upper extremity supported, During functional activity, Reliant on assistive device for balance Standing balance-Leahy Scale: Poor Standing balance comment: RW in standing                             Pertinent Vitals/Pain Pain Assessment Pain Assessment: No/denies pain    Home Living Family/patient expects to be discharged to:: Skilled nursing facility                   Additional Comments: Pt from [facility]. She is a retired Engineer, civil (consulting).    Prior Function Prior  Level of Function : Needs assist;History of Falls (last six months)       Physical Assist : Mobility (physical);ADLs (physical)   ADLs (physical): Bathing;Dressing;Toileting Mobility Comments: transfers with CGAk, walking ~83ft using RW with a w/c follow during her PT sessions. She is modI for bed mobility and  transfers using RW to w/c. Hx of falls ADLs Comments: Pt reports able to bathe UB once setup, has assist for toilets at bed level with assist for pericare. able to feed self,  plays bingo     Extremity/Trunk Assessment   Upper Extremity Assessment Upper Extremity Assessment: Defer to OT evaluation    Lower Extremity Assessment Lower Extremity Assessment: Generalized weakness    Cervical / Trunk Assessment Cervical / Trunk Assessment: Kyphotic  Communication   Communication Communication: Impaired Factors Affecting Communication: Hearing impaired    Cognition Arousal: Alert Behavior During Therapy: WFL for tasks assessed/performed   PT - Cognitive impairments: No apparent impairments                         Following commands: Intact       Cueing Cueing Techniques: Verbal cues     General Comments      Exercises     Assessment/Plan    PT Assessment Patient needs continued PT services  PT Problem List Decreased range of motion;Decreased strength;Decreased activity tolerance;Decreased balance;Decreased mobility;Cardiopulmonary status limiting activity       PT Treatment Interventions DME instruction;Gait training;Functional mobility training;Therapeutic activities;Therapeutic exercise;Balance training;Neuromuscular re-education;Patient/family education    PT Goals (Current goals can be found in the Care Plan section)  Acute Rehab PT Goals Patient Stated Goal: return to SNF and keep walking PT Goal Formulation: With patient Time For Goal Achievement: 10/01/23 Potential to Achieve Goals: Fair    Frequency Min 1X/week     Co-evaluation               AM-PAC PT 6 Clicks Mobility  Outcome Measure Help needed turning from your back to your side while in a flat bed without using bedrails?: A Little Help needed moving from lying on your back to sitting on the side of a flat bed without using bedrails?: A Little Help needed moving to and from a bed to a chair (including a wheelchair)?: A Little Help needed standing up from a chair using your arms (e.g., wheelchair or bedside chair)?: A Little Help needed to walk in  hospital room?: A Lot Help needed climbing 3-5 steps with a railing? : Total 6 Click Score: 15    End of Session Equipment Utilized During Treatment: Gait belt;Oxygen Activity Tolerance: Patient tolerated treatment well Patient left: in chair;with call bell/phone within reach;with chair alarm set;with nursing/sitter in room Nurse Communication: Mobility status PT Visit Diagnosis: Other abnormalities of gait and mobility (R26.89);Difficulty in walking, not elsewhere classified (R26.2);Muscle weakness (generalized) (M62.81)    Time: 8857-8794 PT Time Calculation (min) (ACUTE ONLY): 23 min   Charges:   PT Evaluation $PT Eval Moderate Complexity: 1 Mod   PT General Charges $$ ACUTE PT VISIT: 1 Visit         Lenoard SQUIBB, PT Acute Rehabilitation Services Office: [phone]   Lenoard NOVAK Merrisa Skorupski 09/17/2023, 12:41 PM

## 2023-09-17 NOTE — Consult Note (Cosign Needed Addendum)
 Consultation Note Date: 09/17/2023   Patient Name: Felicia Benson  DOB: 01-27-1930  MRN: [id number]  Age / Sex: 89 y.o., female  PCP: Leonidas Referring Physician: Noralee Elidia Toribio DEWAINE  Reason for Consultation:  end stage R heart failure  HPI/Patient Profile: 89 y.o. female  with past medical history of heart failure, pulmonary hypertension, CAD, heart block s/p PM, COPD, Crohn's, DVT/PE, CKD admitted on 09/14/2023 with complaints of lower extremity and facial edema due to congestive heart failure. Palliative medicine consulted for goals of care due to end stage heart failure.    Primary Decision Maker PATIENT- patient has HCPOA document on chart designating her daughter [name] as initial and [name] as secondary HCPOAs if she is unable to make her own decisions  Discussion: Chart reviewed including labs, progress notes, imaging from this and previous encounters.  Patient has had multiple hospitalizations- approximately one per month in the last 3 months, for heart failure exacerbation.  Labs- Cr trending down 1.98 at noon recheck. Per Cardiology note- plan is to administer metolazone  and another dose of lasix  if Cr is stable. Do not recommend ionotropes. She has been seen by Palliative providers several times in the past and is familiar with Palliative medicine.  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  On evaluation she was awake and alert, sitting up at bedside in her chair.  Discussed with RN- she is baseline oxygen 6LPM, but has required 7-8 during this hospitalization.  Shanyn shares that her goals of care continue to be aggressive interventions. She understands that she would be eligible for hospice, however, she is not interested in having hospice care. She wants to be rehospitalized in the future if needed. She shares that when she is ready for hospice she  will ask for it. Her hope is to return to [facility]. She has not stressing factors at this time that she feels need to be addressed. Her primary concern was getting back to bed from the chair.     SUMMARY OF RECOMMENDATIONS -Heart failure exacerbation- patient will accept all offered interventions- prefers hospitalization if needed, she is clear that she does not want hospice- limit set at DNR -PMT will follow peripherally, please call if acute needs arise -Appreciate nursing coordinating patient visit to her son who is on 4N     Code Status/Advance Care Planning:   Code Status: Limited: Do not attempt resuscitation (DNR) -DNR-LIMITED -Do Not Intubate/DNI     Prognosis:   Unable to determine  Discharge Planning: To Be Determined  Primary Diagnoses: Present on Admission:  Acute on chronic diastolic heart failure (HCC)  Essential hypertension  Chronic kidney disease, stage 3b (HCC)  COPD (chronic obstructive pulmonary disease) (HCC)  Hyperlipidemia  GERD (gastroesophageal reflux disease)  Crohn's disease (HCC)  OSA (obstructive sleep apnea)  Anemia of chronic disease  History of pulmonary embolism  Obesity, class 1  Macular degeneration   Review of Systems  All other systems reviewed and are negative.   Physical  Exam Vitals and nursing note reviewed.  Constitutional:      General: She is not in acute distress.    Appearance: She is not ill-appearing.  Cardiovascular:     Rate and Rhythm: Normal rate.  Pulmonary:     Effort: Pulmonary effort is normal.  Neurological:     Mental Status: She is alert and oriented to person, place, and time.     Vital Signs: BP 99/65 (BP Location: Left Wrist)   Pulse 88   Temp (!) 97.4 F (36.3 C) (Oral)   Resp 18   Ht 5' 6 (1.676 m)   Wt 92.9 kg   SpO2 92%   BMI 33.06 kg/m  Pain Scale: 0-10   Pain Score: 0-No pain   SpO2: SpO2: 92 % O2 Device:SpO2: 92 % O2 Flow Rate: .O2 Flow Rate (L/min): 8 L/min  IO:  Intake/output summary:  Intake/Output Summary (Last 24 hours) at 09/17/2023 1532 Last data filed at 09/17/2023 0900 Gross per 24 hour  Intake 717 ml  Output 600 ml  Net 117 ml    LBM: Last BM Date : 09/16/23 Baseline Weight: Weight: 92.2 kg Most recent weight: Weight: 92.9 kg       Thank you for this consult. Palliative medicine will continue to follow and assist as needed.   Signed by: Cassondra Stain, AGNP-C Palliative Medicine  Time includes:   Preparing to see the patient (e.g., review of tests) Obtaining and/or reviewing separately obtained history Performing a medically necessary appropriate examination and/or evaluation Counseling and educating the patient/family/caregiver Ordering medications, tests, or procedures Referring and communicating with other health care professionals (when not reported separately) Documenting clinical information in the electronic or other health record Independently interpreting results (not reported separately) and communicating results to the patient/family/caregiver Care coordination (not reported separately) Clinical documentation   Please contact Palliative Medicine Team phone at [phone] for questions and concerns.  For individual provider: See Tracey

## 2023-09-17 NOTE — Progress Notes (Signed)
 Remote PPM Transmission

## 2023-09-17 NOTE — Evaluation (Addendum)
 Occupational Therapy Evaluation Patient Details Name: Felicia Benson MRN: [id number] DOB: 1930/05/17 Today's Date: 09/17/2023   History of Present Illness   89 y.o. female adm 09/14/23 from [facility] for LB edema, SOB and CHF exacerbation. PMHx: AAA, CHF, Crohn's Disease, GERD, GIB, HTN, OSA, CHB s/p PPM, DVT with IVC filter,  Rt femur fx s/p IM nail, COPD on 6L O2, HLD, CKD, CAD     Clinical Impressions PT admitted with CHF exacerbation. Pt currently with functional limitiations due to the deficits listed below (see OT problem list). Pt at this time requires min (A) for bed mobility and from lower surface transfer. Pt demonstrates short transfer see PT evaluation. Pt completed bed level bathing with OT handing patient wash cloth.  Pt will benefit from skilled OT to increase their independence and safety with adls and balance to allow discharge skilled inpatient follow up therapy, <3 hours/day. . Call me Mrs. Ronita     If plan is discharge home, recommend the following:   A little help with walking and/or transfers     Functional Status Assessment   Patient has had a recent decline in their functional status and demonstrates the ability to make significant improvements in function in a reasonable and predictable amount of time.     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 pt needs increased (A) from lower surface      Balance     Sitting balance-Leahy Scale: Fair     Standing balance support: Bilateral upper extremity supported, During functional activity, Reliant on assistive device for balance Standing balance-Leahy Scale: Poor Standing balance comment: RW in standing                           ADL either performed or assessed with clinical judgement   ADL Overall ADL's : Needs assistance/impaired Eating/Feeding: Set up;Bed level   Grooming: Set up;Bed level   Upper Body Bathing: Set up;Bed level   Lower Body Bathing: Moderate assistance   Upper Body Dressing : Set up;Bed level   Lower Body Dressing: Maximal assistance         Toileting - Clothing Manipulation Details (indicate cue type and reason): pt verbalized having 2 events of incontinence and lack of awarness. pt asked therapist to check skin but was unaware that she was incontinent of stool. pt said i can't even tell       General ADL Comments: progressed oob to chair and then short transfer with PT. Ot Followed with chair for support.     Vision Baseline Vision/History: 2 Legally blind;6 Macular Degeneration Ability to See in Adequate Light: 3 Highly impaired  Perception         Praxis         Pertinent Vitals/Pain Pain Assessment Pain Assessment: No/denies pain     Extremity/Trunk Assessment Upper Extremity Assessment Upper Extremity Assessment: Generalized weakness   Lower Extremity Assessment Lower Extremity Assessment: Generalized weakness;Defer to PT evaluation   Cervical / Trunk Assessment Cervical / Trunk Assessment: Kyphotic   Communication Communication Communication: Impaired Factors  Affecting Communication: Hearing impaired   Cognition Arousal: Alert Behavior During Therapy: WFL for tasks assessed/performed               OT - Cognition Comments: pt appropriately asking questions to nurse regarding care and medications. pt however needs redirection during session multiple times to new infomration about mobility adn goal of session                 Following commands: Intact       Cueing  General Comments   Cueing Techniques: Verbal cues  91% on 6L and desaturation with transfer 88% 6L [place]   Exercises     Shoulder Instructions      Home Living Family/patient expects to be discharged to:: Skilled nursing facility                                 Additional Comments: Pt from [facility]. She is a retired Engineer, civil (consulting).      Prior Functioning/Environment Prior Level of Function : Needs assist;History of Falls (last six months)       Physical Assist : Mobility (physical);ADLs (physical) Mobility (physical): Gait ADLs (physical): Bathing;Dressing;Toileting Mobility Comments: transfers with CGAk, walking ~44ft using RW with a w/c follow during her PT sessions. She is modI for bed mobility and  transfers using RW to w/c. Hx of falls ADLs Comments: Pt reports able to bathe UB once setup, has assist for toilets at bed level with assist for pericare. able to feed self, plays bingo    OT Problem List: Decreased strength;Decreased activity tolerance;Impaired balance (sitting and/or standing);Cardiopulmonary status limiting activity   OT Treatment/Interventions: Self-care/ADL training;Therapeutic exercise;Energy conservation;DME and/or AE instruction;Therapeutic activities;Balance training;Patient/family education      OT Goals(Current goals can be found in the care plan section)   Acute Rehab OT Goals Patient Stated Goal: to get washed up OT Goal Formulation: With patient Time For Goal Achievement: 10/01/23 Potential to Achieve Goals:  Good   OT Frequency:  Min 1X/week    Co-evaluation              AM-PAC OT 6 Clicks Daily Activity     Outcome Measure Help from another person eating meals?: None Help from another person taking care of personal grooming?: A Little Help from another person toileting, which includes using toliet, bedpan, or urinal?: A Lot Help from another person bathing (including washing, rinsing, drying)?: A Lot Help from another person to put on and taking off regular upper body clothing?: A Lot Help from another person to put on and taking off regular lower body clothing?: Total 6 Click Score: 14   End of Session Equipment Utilized During Treatment: Oxygen Nurse Communication: Mobility status  Activity Tolerance: Patient tolerated treatment well Patient left: in bed;with call bell/phone within reach;Other (comment) (up with PT for evaluation)  OT Visit Diagnosis: Unsteadiness on feet (R26.81);Muscle weakness (generalized) (M62.81)                Time: 8874-8841 OT Time Calculation (  min): 33 min Charges:  OT General Charges $OT Visit: 1 Visit OT Evaluation $OT Eval Moderate Complexity: 1 Mod   Brynn, OTR/L  Acute Rehabilitation Services Office: [phone] .  Ely Molt 09/17/2023, 5:10 PM

## 2023-09-17 NOTE — Progress Notes (Signed)
 PROGRESS NOTE    Felicia Benson  FMW:[id number] DOB: 01/31/1930 DOA: 09/14/2023 PCP: Leonidas  89 yo female, nursing home resident, with the past medical history of Crohn's disease, COPD, CKD stage 3B, history of DVT and PE sp IVC filter, who presented with dyspnea, lower extremity edema and weight gain.    Subjective:   Assessment and Plan:  Acute on chronic diastolic heart failure  RV Failure, Pulm HTN 07/2023 echo w/ 45 to 50%, global hypokinesis, severe LVH of septal segment,  reduction RV systolic function, moderate to severe mitral valve regurgitation, moderate tricuspid valve regurgitation,  -continue aggressive diuresis with furosemide  80 mg IV bid To consider thiazide diuretic with metolazone   Limited medical therapy due to risk of hypotension and reduced GFR Continue midodrine    Acute on chronic hypoxic resp failure Advanced COPD, on 5L O2 at baseline  Essential hypertension Continue blood pressure support with midodrine    CAD S/P percutaneous coronary angioplasty No chest pain, no acute coronary syndrome,  Elevation of high sensitive troponin due to heart failure exacerbation  Continue aspirin  and statin   Chronic kidney disease, stage 3b (HCC) Hypokalemia, hyponatremia, AKI  stable  COPD (chronic obstructive pulmonary disease) (HCC) No signs of acute exacerbation Continue supplemental 02 per [place] Continue bronchodilator   Hyperlipidemia Continue with statin therapy   GERD (gastroesophageal reflux disease) Continue proton pump inhibitor  Crohn's disease (HCC) Continue mesalamine , no signs of acute flare.   OSA (obstructive sleep apnea) Cpap   Anemia of chronic disease Cell count has been stable   History of pulmonary embolism Sp IV filter, not on oral anticoagulation   Macular degeneration Outpatient follow up   Obesity, class 1 Calculated BMI is 32.8         DVT prophylaxis: Code Status:  Family Communication: Disposition Plan:    Consultants:    Procedures:   Antimicrobials:    Objective: Vitals:   09/17/23 0025 09/17/23 0515 09/17/23 0723 09/17/23 1236  BP: 101/69 102/63 99/65   Pulse: 89 84 88   Resp: 17 16 18    Temp: (!) 97.5 F (36.4 C) (!) 97.3 F (36.3 C) (!) 97.4 F (36.3 C)   TempSrc: Oral Oral Oral   SpO2: 93% 93% 92% 92%  Weight:  92.9 kg    Height:        Intake/Output Summary (Last 24 hours) at 09/17/2023 1340 Last data filed at 09/17/2023 0900 Gross per 24 hour  Intake 717 ml  Output 600 ml  Net 117 ml   Filed Weights   09/15/23 0100 09/16/23 0455 09/17/23 0515  Weight: 92.2 kg 92.8 kg 92.9 kg    Examination:  General exam: Appears calm and comfortable  Respiratory system: Clear to auscultation Cardiovascular system: S1 & S2 heard, RRR.  Abd: nondistended, soft and nontender.Normal bowel sounds heard. Central nervous system: Alert and oriented. No focal neurological deficits. Extremities: no edema Skin: No rashes Psychiatry:  Mood & affect appropriate.     Data Reviewed:   CBC: Recent Labs  Lab 09/14/23 2125  WBC 7.6  NEUTROABS 5.8  HGB 9.7*  HCT 31.4*  MCV 91.3  PLT 228   Basic Metabolic Panel: Recent Labs  Lab 09/14/23 2125 09/15/23 0225 09/16/23 0911 09/17/23 0324  NA 138 135 133* 131*  K 3.3* 3.2* 3.3* 3.7  CL 89* 88* 88* 88*  CO2 35* 32 28 34*  GLUCOSE 115* 91 130* 86  BUN 68* 67* 72* 75*  CREATININE 2.16* 2.03* 2.18* 2.09*  CALCIUM  8.4*  8.5* 8.3* 8.1*  MG  --  1.8 1.7 2.1  PHOS  --  4.4  --   --    GFR: Estimated Creatinine Clearance: 20.1 mL/min (A) (by C-G formula based on SCr of 2.09 mg/dL (H)). Liver Function Tests: Recent Labs  Lab 09/14/23 2125  AST 27  ALT 21  ALKPHOS 83  BILITOT 0.7  PROT 6.5  ALBUMIN  2.6*   No results for input(s): LIPASE, AMYLASE in the last 168 hours. No results for input(s): AMMONIA in the last 168 hours. Coagulation Profile: No results for input(s): INR, PROTIME in the last 168  hours. Cardiac Enzymes: No results for input(s): CKTOTAL, CKMB, CKMBINDEX, TROPONINI in the last 168 hours. BNP (last 3 results) No results for input(s): PROBNP in the last 8760 hours. HbA1C: No results for input(s): HGBA1C in the last 72 hours. CBG: No results for input(s): GLUCAP in the last 168 hours. Lipid Profile: No results for input(s): CHOL, HDL, LDLCALC, TRIG, CHOLHDL, LDLDIRECT in the last 72 hours. Thyroid Function Tests: Recent Labs    09/15/23 0225  TSH 1.019   Anemia Panel: No results for input(s): VITAMINB12, FOLATE, FERRITIN, TIBC, IRON, RETICCTPCT in the last 72 hours. Urine analysis:    Component Value Date/Time   COLORURINE YELLOW 09/15/2023 0150   APPEARANCEUR CLEAR 09/15/2023 0150   LABSPEC 1.008 09/15/2023 0150   PHURINE 6.0 09/15/2023 0150   GLUCOSEU NEGATIVE 09/15/2023 0150   HGBUR MODERATE (A) 09/15/2023 0150   BILIRUBINUR NEGATIVE 09/15/2023 0150   KETONESUR NEGATIVE 09/15/2023 0150   PROTEINUR NEGATIVE 09/15/2023 0150   NITRITE NEGATIVE 09/15/2023 0150   LEUKOCYTESUR MODERATE (A) 09/15/2023 0150   Sepsis Labs: @LABRCNTIP (procalcitonin:4,lacticidven:4)  ) Recent Results (from the past 240 hours)  MRSA Next Gen by PCR, Nasal     Status: Abnormal   Collection Time: 09/16/23  1:31 PM   Specimen: Nasal Mucosa; Nasal Swab  Result Value Ref Range Status   MRSA by PCR Next Gen DETECTED (A) NOT DETECTED Final    Comment: RESULT CALLED TO, READ BACK BY AND VERIFIED WITH: RN ANTONETTA PARAS [id number] FCP (NOTE) The GeneXpert MRSA Assay (FDA approved for NASAL specimens only), is one component of a comprehensive MRSA colonization surveillance program. It is not intended to diagnose MRSA infection nor to guide or monitor treatment for MRSA infections. Test performance is not FDA approved in patients less than 70 years old. Performed at [facility], [address] [place], [place] [address]      Radiology  Studies: No results found.   Scheduled Meds:  acetaminophen   650 mg Oral QHS   aspirin  EC  81 mg Oral Daily   atorvastatin   40 mg Oral QHS   Chlorhexidine  Gluconate Cloth  6 each Topical Daily   diphenhydrAMINE   25 mg Oral QHS   docusate sodium   200 mg Oral Daily   fluticasone  furoate-vilanterol  1 puff Inhalation Daily   leptospermum manuka honey  1 Application Topical Daily   mesalamine   1,000 mg Oral BID   midodrine   5 mg Oral BID WC   montelukast   10 mg Oral Daily   mupirocin  ointment  1 Application Nasal BID   pantoprazole   40 mg Oral BID   sodium chloride  flush  3 mL Intravenous Q12H   Continuous Infusions:   LOS: 3 days    Time spent:    Sigurd Pac, MD [facility]   09/17/2023, 1:40 PM

## 2023-09-17 NOTE — Progress Notes (Signed)
 PT Cancellation Note  Patient Details Name: Felicia Benson MRN: [id number] DOB: 05-Aug-1930   Cancelled Treatment:    Reason Eval/Treat Not Completed: Patient declined, no reason specified (pt states desire to get OOB but not at this time as attempting to have BM in bed.)   Felicia Benson B Terease Marcotte 09/17/2023, 10:53 AM Lenoard SQUIBB, PT Acute Rehabilitation Services Office: [phone]

## 2023-09-17 NOTE — Progress Notes (Signed)
 Mobility Specialist Progress Note:    09/17/23 1451  Mobility  Activity Pivoted/transferred from chair to bed  Level of Assistance +2 (takes two people)  Press photographer wheel walker  Distance Ambulated (ft) 3 ft  Activity Response Tolerated poorly  Mobility Referral No  Mobility visit 1 Mobility  Mobility Specialist Start Time (ACUTE ONLY) 1451  Mobility Specialist Stop Time (ACUTE ONLY) 1507  Mobility Specialist Time Calculation (min) (ACUTE ONLY) 16 min   Received pt in recliner w/ NT requesting to get back in bed. Pt c/o being tired and sore from sitting so long. Pt taken small steps to pivot to bed. Pt had to do two stands in order to get completely in bed. Left pt in room w/ NT and all needs met.   [name] Mobility Specialist Please Neurosurgeon or Rehab Office at [phone]

## 2023-09-17 NOTE — Progress Notes (Signed)
 Progress Note   Patient: Felicia Benson FMW:[id number] DOB: 1930/05/20 DOA: 09/14/2023     3 DOS: the patient was seen and examined on 09/17/2023   Brief hospital course: Felicia Benson was admitted to the hospital with the working diagnosis of acute heart failure exacerbation.   89 yo female with the past medical history of heart failure, pulmonary hypertension, coronary artery disease, heart block sp pacemaker, COPD, CKD, DVT/PE, and Crohn's disease who presented with lower extremity edema and weight gain.  She had worsening symptoms despite aggressive oral diuretic at the SNF.  On her initial physical examination her blood pressure was 109/78, HR 102 RR 25 and 02 saturation 89%  Lungs with bilateral rales and decreased inspiratory effort, heart with S1 and S2 present and regular with no gallops, positive systolic murmur at the right lower sternal border, abdomen protuberant, but not distended or tender, positive lower extremity edema +++, more left than right, also noted upper extremities edema.   Na 138, K 3,3 Cl 89, bicarbonate 35 glucose 115 bun 68 cr 2.16  BNP 2,920  High sensitive troponin 63 and 62  Wbc 7,6 hgb 9,7 plt 228  Urine analysis SG 1,008, negative protein, moderate leukocytes and moderate hgb, wbc 6-10 and rbc 6-10   Chest radiograph with hypoinflation, bilateral hilar vascular congestion, with bilateral basal atelectasis, pacemaker in place with right atrial and right ventricular lead in place.   EKG 102 bpm, right axis deviation, right bundle branch block, qtc 602, sinus rhythm with 1st degree AV block, ventricular paced rhythm with no significant ST segment or T wave changes, poor RR wave progression,   Placed on IV furosemide  for diuresis.   09/09 not responding well to diuresis, she has advanced right heart failure, discussed with cardiology.  Consulted palliative care  Assessment and Plan: * Acute on chronic diastolic heart failure (HCC) 07/2023 echocardiogram with  mild reduction in LV systolic function 45 to 50%, global hypokinesis, severe asymmetric left ventricular hypertrophy of the septal segment, normal size left and right atriums, no pericardial effusion,  interventricular septum is flattened in systole and diastole, mild reduction RV systolic function, moderate to severe mitral valve regurgitation, moderate tricuspid valve regurgitation, mild aortic regurgitation.   Acute on chronic cor pulmonale Pulmonary hypertension  RV failure.   Urine output documented at 600 cc probably not accurate. Weight unchanged to 92 Kg.  Systolic blood pressure 110 mmHg range.   Plan to continue aggressive diuresis with furosemide  80 mg IV bid Added 5 mg metolazone  x1   Limited medical therapy due to risk of hypotension and reduced GFR Continue midodrine  for blood pressure support.  Consulted palliative care   Essential hypertension Continue blood pressure support with midodrine    CAD S/P percutaneous coronary angioplasty No chest pain, no acute coronary syndrome,  Elevation of high sensitive troponin due to heart failure exacerbation  Continue aspirin  and statin   Chronic kidney disease, stage 3b (HCC) Hypokalemia, hyponatremia, AKI   Renal function with serum cr at 1,98 with K at 3,5 and serum bicarbonate at 31 Na 131   Continue diuresis with furosemide   Added one dose of metolazone    COPD (chronic obstructive pulmonary disease) (HCC) No signs of acute exacerbation Continue supplemental 02 per Felicia Benson Continue bronchodilator   Hyperlipidemia Continue with statin therapy   GERD (gastroesophageal reflux disease) Continue proton pump inhibitor  Crohn's disease (HCC) Continue mesalamine , no signs of acute flare.   OSA (obstructive sleep apnea) Cpap   Anemia of chronic disease  Cell count has been stable   History of pulmonary embolism Sp IV filter, not on oral anticoagulation   Macular degeneration Outpatient follow up   Obesity, class  1 Calculated BMI is 32.8         Subjective: patient continue very weak and deconditioned, persistent edema, no chest pain   Physical Exam: Vitals:   09/17/23 0515 09/17/23 0723 09/17/23 1236 09/17/23 1535  BP: 102/63 99/65  114/71  Pulse: 84 88  88  Resp: 16 18  16   Temp: (!) 97.3 F (36.3 C) (!) 97.4 F (36.3 C)  98 F (36.7 C)  TempSrc: Oral Oral  Oral  SpO2: 93% 92% 92% 92%  Weight: 92.9 kg     Height:       Neurology awake and alert ENT with mild pallor Cardiovascular with S1 and S2 present and regular with no gallops or rubs, positive systolic murmur at the right lower sternal border Respiratory with rales bilaterally with poor inspiratory effort with no wheezing Abdomen with no distention Positive lower extremity edema ++  Data Reviewed:    Family Communication: no family at the bedside   Disposition: Status is: Inpatient Remains inpatient appropriate because: heart failure   Planned Discharge Destination: Skilled nursing facility     Author: Elidia Toribio Furnace, MD 09/17/2023 6:19 PM  For on call review [email or web address].

## 2023-09-17 NOTE — Plan of Care (Signed)
   Problem: Education: Goal: Knowledge of General Education information will improve Description: Including pain rating scale, medication(s)/side effects and non-pharmacologic comfort measures Outcome: Progressing   Problem: Clinical Measurements: Goal: Diagnostic test results will improve Outcome: Progressing

## 2023-09-18 DIAGNOSIS — I5033 Acute on chronic diastolic (congestive) heart failure: Secondary | ICD-10-CM | POA: Diagnosis not present

## 2023-09-18 LAB — BASIC METABOLIC PANEL WITH GFR
Anion gap: 9 (ref 5–15)
BUN: 72 mg/dL — ABNORMAL HIGH (ref 8–23)
CO2: 36 mmol/L — ABNORMAL HIGH (ref 22–32)
Calcium: 8.1 mg/dL — ABNORMAL LOW (ref 8.9–10.3)
Chloride: 88 mmol/L — ABNORMAL LOW (ref 98–111)
Creatinine, Ser: 1.97 mg/dL — ABNORMAL HIGH (ref 0.44–1.00)
GFR, Estimated: 24 mL/min — ABNORMAL LOW (ref >60)
Glucose, Bld: 103 mg/dL — ABNORMAL HIGH (ref 70–99)
Potassium: 3.7 mmol/L (ref 3.5–5.1)
Sodium: 133 mmol/L — ABNORMAL LOW (ref 135–145)

## 2023-09-18 LAB — MAGNESIUM: Magnesium: 2.3 mg/dL (ref 1.7–2.4)

## 2023-09-18 MED ORDER — FUROSEMIDE 10 MG/ML IJ SOLN
80.0000 mg | Freq: Once | INTRAMUSCULAR | Status: AC
  Administered 2023-09-18: 80 mg via INTRAVENOUS
  Filled 2023-09-18: qty 8

## 2023-09-18 MED ORDER — METOLAZONE 5 MG PO TABS
5.0000 mg | ORAL_TABLET | Freq: Once | ORAL | Status: AC
  Administered 2023-09-18: 5 mg via ORAL
  Filled 2023-09-18: qty 1

## 2023-09-18 MED ORDER — FUROSEMIDE 10 MG/ML IJ SOLN
80.0000 mg | Freq: Two times a day (BID) | INTRAMUSCULAR | Status: AC
  Administered 2023-09-18: 80 mg via INTRAVENOUS
  Filled 2023-09-18 (×2): qty 8

## 2023-09-18 NOTE — Plan of Care (Signed)
   Problem: Education: Goal: Knowledge of General Education information will improve Description Including pain rating scale, medication(s)/side effects and non-pharmacologic comfort measures Outcome: Progressing   Problem: Health Behavior/Discharge Planning: Goal: Ability to manage health-related needs will improve Outcome: Progressing

## 2023-09-18 NOTE — Progress Notes (Signed)
 Rounding Note   Patient Name: Felicia Benson Date of Encounter: 09/18/2023  [facility] Cardiologist: Annabella Scarce, MD   Subjective Pt laying in bed   Says she never has a problem breathing   Denies CP    Scheduled Meds:  acetaminophen   650 mg Oral QHS   aspirin  EC  81 mg Oral Daily   atorvastatin   40 mg Oral QHS   Chlorhexidine  Gluconate Cloth  6 each Topical Daily   diphenhydrAMINE   25 mg Oral QHS   docusate sodium   200 mg Oral Daily   fluticasone  furoate-vilanterol  1 puff Inhalation Daily   leptospermum manuka honey  1 Application Topical Daily   mesalamine   1,000 mg Oral BID   midodrine   5 mg Oral BID WC   montelukast   10 mg Oral Daily   mupirocin  ointment  1 Application Nasal BID   pantoprazole   40 mg Oral BID   sodium chloride  flush  3 mL Intravenous Q12H   Continuous Infusions:  PRN Meds: acetaminophen , guaiFENesin -dextromethorphan , ipratropium-albuterol , loratadine , ondansetron  (ZOFRAN ) IV, polyethylene glycol, sodium chloride    Vital Signs  Vitals:   09/17/23 1535 09/17/23 2043 09/18/23 0050 09/18/23 0412  BP: 114/71 107/65 93/60 110/70  Pulse: 88 87 85 88  Resp: 16 19 17 16   Temp: 98 F (36.7 C) 97.7 F (36.5 C) 97.7 F (36.5 C) 97.7 F (36.5 C)  TempSrc: Oral Oral Oral Oral  SpO2: 92% 95% 96% 97%  Weight:    97.1 kg  Height:        Intake/Output Summary (Last 24 hours) at 09/18/2023 0804 Last data filed at 09/18/2023 0416 Gross per 24 hour  Intake 716 ml  Output 1050 ml  Net -334 ml      09/18/2023    4:12 AM 09/17/2023    5:15 AM 09/16/2023    4:55 AM  Last 3 Weights  Weight (lbs) 214 lb 204 lb 12.9 oz 204 lb 9.4 oz  Weight (kg) 97.07 kg 92.9 kg 92.8 kg      Telemetry SR  Paced  I personally reviewed  ECG  No new  - Personally Reviewed  Physical Exam  GEN: Obese 89 yo inno acute distress.    On 6L O2  Neck: Neck is full JVP appears increased  Cardiac: RRR  II/VI systolic murmur LSB  PRom P2 Respiratory:CTA   GI:  Soft, nontender Ext   1+ LE edema   Pneumatic compression sleeves on Labs High Sensitivity Troponin:   Recent Labs  Lab 09/14/23 2125 09/14/23 2339  TROPONINIHS 63* 62*     Chemistry Recent Labs  Lab 09/14/23 2125 09/15/23 0225 09/16/23 0911 09/17/23 0324 09/17/23 1316 09/18/23 0231  NA 138   < > 133* 131* 131* 133*  K 3.3*   < > 3.3* 3.7 3.5 3.7  CL 89*   < > 88* 88* 87* 88*  CO2 35*   < > 28 34* 31 36*  GLUCOSE 115*   < > 130* 86 127* 103*  BUN 68*   < > 72* 75* 76* 72*  CREATININE 2.16*   < > 2.18* 2.09* 1.98* 1.97*  CALCIUM  8.4*   < > 8.3* 8.1* 8.1* 8.1*  MG  --    < > 1.7 2.1  --  2.3  PROT 6.5  --   --   --   --   --   ALBUMIN  2.6*  --   --   --   --   --  AST 27  --   --   --   --   --   ALT 21  --   --   --   --   --   ALKPHOS 83  --   --   --   --   --   BILITOT 0.7  --   --   --   --   --   GFRNONAA 21*   < > 21* 22* 23* 24*  ANIONGAP 14   < > 17* 9 13 9    < > = values in this interval not displayed.    Lipids No results for input(s): CHOL, TRIG, HDL, LABVLDL, LDLCALC, CHOLHDL in the last 168 hours.  Hematology Recent Labs  Lab 09/14/23 2125  WBC 7.6  RBC 3.44*  HGB 9.7*  HCT 31.4*  MCV 91.3  MCH 28.2  MCHC 30.9  RDW 17.9*  PLT 228   Thyroid  Recent Labs  Lab 09/15/23 0225  TSH 1.019    BNP Recent Labs  Lab 09/14/23 2125  BNP 2,920.5*    DDimer No results for input(s): DDIMER in the last 168 hours.   Radiology  No results found.   Cardiac Studies Echo   July 2025 (report amended Sept 2025)   1. Left ventricular ejection fraction, by estimation, is 45 to 50%. The  left ventricle has mildly decreased function. The left ventricle  demonstrates global hypokinesis. There is severe asymmetric left  ventricular hypertrophy of the septal segment.  There is the interventricular septum is flattened in systole and diastole,  consistent with right ventricular pressure and volume overload.   2. Right ventricular systolic  function is severely reduced. The right  ventricular size is severely enlarged. There is severely elevated  pulmonary artery systolic pressure.   3. Right atrial size was mildly dilated.   4. The mitral valve is normal in structure. Moderate to severe mitral  valve regurgitation. No evidence of mitral stenosis.   5. Tricuspid valve regurgitation is moderate to severe.   6. The aortic valve is normal in structure. Aortic valve regurgitation is  mild. No aortic stenosis is present.   7. The inferior vena cava is dilated in size with >50% respiratory  variability, suggesting right atrial pressure of 8 mmHg.    Patient Profile   89 y.o. 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), COPD (on 5 L O2), asked to see for CHF  Assessment & Plan   1  CHF  Pt with cor pulmonale and mild LV dysfunction    On review,of echo, RVEF is severely depressed LVEF mildly depressed,   Echo also shows mild MR and mod/severe TR  Pt has not had any signif diuresis since admit   Wt up but bed was switched Cr stable Na 133    Would give zaroxylyn 5 and lasix  80   Follow response    With age, other medical problems not a candidate for more aggessive Rx    2  Hypotension   SBP 90s to 100s   On midodrine    3 Tricuspid regurgitation Mod/severe   4  CHB  s/p PPM July 2024  5 REnal  BUN / CR  72/1.97     COPD   Pt with severe COPD Sats 87 to 96 on 6 L  6   OSA  pt has CPAP    7  Hx DVT/PE   Pt has IVC filter Jan 2025    Not on anticoagulation due to GI bleeding / Crohns Continue intermitt pneumatic compresion     For questions or updates, please contact [facility] Please consult [email or web address] for contact info under     Signed, Vina Gull, MD  09/18/2023, 8:04 AM

## 2023-09-18 NOTE — Progress Notes (Signed)
   09/18/23 2034  BiPAP/CPAP/SIPAP  Reason BIPAP/CPAP not in use Non-compliant (Pt states she does not want to wear CPAP tonight)  BiPAP/CPAP /SiPAP Vitals  Pulse Rate 89  Resp 15  SpO2 95 %  MEWS Score/Color  MEWS Score 0  MEWS Score Color [place]

## 2023-09-18 NOTE — Plan of Care (Signed)

## 2023-09-18 NOTE — TOC Progression Note (Signed)
 Transition of Care [facility]) - Progression Note    Patient Details  Name: Felicia Benson MRN: [id number] Date of Birth: 22-Sep-1930  [facility] CM/SW Contact  Luise JAYSON Pan, [place] Phone Number: 09/18/2023, 2:40 PM  Clinical Narrative:   Per daily meeting with treatment team, patient is not medically stable for discharge at this time.  CSW will continue to follow.   Expected Discharge Plan: Long Term Nursing Home Barriers to Discharge: Continued Medical Work up               Expected Discharge Plan and Services In-house Referral: Clinical Social Work   Post Acute Care Choice: Skilled Nursing Facility Living arrangements for the past 2 months: Skilled Nursing Facility                                       Social Drivers of Health (SDOH) Interventions SDOH Screenings   Food Insecurity: No Food Insecurity (09/15/2023)  Housing: Low Risk  (09/15/2023)  Transportation Needs: No Transportation Needs (09/15/2023)  Utilities: Not At Risk (09/15/2023)  Social Connections: Moderately Integrated (09/15/2023)  Recent Concern: Social Connections - Moderately Isolated (07/24/2023)  Tobacco Use: Medium Risk (09/14/2023)    Readmission Risk Interventions    07/26/2023   11:43 AM 07/24/2022    2:49 PM  Readmission Risk Prevention Plan  Transportation Screening Complete Complete  PCP or Specialist Appt within 5-7 Days  Complete  Home Care Screening  Complete  Medication Review (RN CM)  Complete  Medication Review (RN Care Manager) Complete   PCP or Specialist appointment within 3-5 days of discharge Complete   HRI or Home Care Consult Complete   Palliative Care Screening Not Applicable   Skilled Nursing Facility Not Applicable

## 2023-09-19 DIAGNOSIS — I5033 Acute on chronic diastolic (congestive) heart failure: Secondary | ICD-10-CM | POA: Diagnosis not present

## 2023-09-19 LAB — BASIC METABOLIC PANEL WITH GFR
Anion gap: 13 (ref 5–15)
BUN: 66 mg/dL — ABNORMAL HIGH (ref 8–23)
CO2: 34 mmol/L — ABNORMAL HIGH (ref 22–32)
Calcium: 8.2 mg/dL — ABNORMAL LOW (ref 8.9–10.3)
Chloride: 87 mmol/L — ABNORMAL LOW (ref 98–111)
Creatinine, Ser: 1.73 mg/dL — ABNORMAL HIGH (ref 0.44–1.00)
GFR, Estimated: 28 mL/min — ABNORMAL LOW (ref >60)
Glucose, Bld: 94 mg/dL (ref 70–99)
Potassium: 3.6 mmol/L (ref 3.5–5.1)
Sodium: 134 mmol/L — ABNORMAL LOW (ref 135–145)

## 2023-09-19 MED ORDER — POTASSIUM CHLORIDE CRYS ER 20 MEQ PO TBCR
40.0000 meq | EXTENDED_RELEASE_TABLET | Freq: Two times a day (BID) | ORAL | Status: AC
  Administered 2023-09-19 (×2): 40 meq via ORAL
  Filled 2023-09-19 (×2): qty 2

## 2023-09-19 MED ORDER — FUROSEMIDE 10 MG/ML IJ SOLN
80.0000 mg | Freq: Two times a day (BID) | INTRAMUSCULAR | Status: DC
  Administered 2023-09-19: 80 mg via INTRAVENOUS
  Filled 2023-09-19: qty 8

## 2023-09-19 MED ORDER — FUROSEMIDE 10 MG/ML IJ SOLN
80.0000 mg | Freq: Two times a day (BID) | INTRAMUSCULAR | Status: DC
  Administered 2023-09-19 – 2023-09-22 (×6): 80 mg via INTRAVENOUS
  Filled 2023-09-19 (×6): qty 8

## 2023-09-19 MED ORDER — FUROSEMIDE 10 MG/ML IJ SOLN
80.0000 mg | Freq: Two times a day (BID) | INTRAMUSCULAR | Status: DC
Start: 2023-09-19 — End: 2023-09-19

## 2023-09-19 MED ORDER — METOLAZONE 5 MG PO TABS
5.0000 mg | ORAL_TABLET | Freq: Once | ORAL | Status: AC
  Administered 2023-09-19: 5 mg via ORAL
  Filled 2023-09-19: qty 1

## 2023-09-19 NOTE — Plan of Care (Signed)
 Palliative:  PMT continues to follow along for needs - no outsanding needs identified.  Briefly checked in with daughter - no needs. Goals remain clear: patient will accept all offered interventions- prefers hospitalization if needed, she is clear that she does not want hospice- limit set at DNR   Tobey Jama Barnacle, DNP, [facility] Palliative Medicine Team Team Phone # [phone]  Pager # [phone]

## 2023-09-19 NOTE — Plan of Care (Signed)
  Problem: Education: Goal: Knowledge of General Education information will improve Description: Including pain rating scale, medication(s)/side effects and non-pharmacologic comfort measures Outcome: Progressing   Problem: Clinical Measurements: Goal: Ability to maintain clinical measurements within normal limits will improve Outcome: Progressing   Problem: Clinical Measurements: Goal: Will remain free from infection Outcome: Progressing   Problem: Clinical Measurements: Goal: Diagnostic test results will improve Outcome: Progressing   Problem: Activity: Goal: Risk for activity intolerance will decrease Outcome: Progressing   Problem: Nutrition: Goal: Adequate nutrition will be maintained Outcome: Progressing   Problem: Coping: Goal: Level of anxiety will decrease Outcome: Progressing

## 2023-09-19 NOTE — Progress Notes (Signed)
   09/19/23 2237  BiPAP/CPAP/SIPAP  Reason BIPAP/CPAP not in use Non-compliant   Pt refused CPAP for nighttime use. Pt states she does not wear a CPAP. Currently maintaining on 6L [place] at this time.

## 2023-09-19 NOTE — Care Management Important Message (Signed)
 Important Message  Patient Details  Name: Felicia Benson MRN: [id number] Date of Birth: 06-Oct-1930   Important Message Given:  Yes - Medicare IM     Felicia Benson 09/19/2023, 11:46 AM

## 2023-09-19 NOTE — Progress Notes (Signed)
 PROGRESS NOTE    Felicia Benson  FMW:[id number] DOB: [birth year] DOA: 09/14/2023 PCP: Leonidas  89 yo female, nursing home resident, with the past medical history of Crohn's disease, COPD, CKD stage 3B, history of DVT and PE sp IVC filter, who presented with dyspnea, lower extremity edema and weight gain.    Subjective: Feels fair, no events overnight   Assessment and Plan:  Acute on chronic diastolic heart failure  RV Failure, Pulm HTN Moderate to severe mitral regurgitation -Echo 7/25 w/ 45 to 50%, global hypokinesis, severe LVH of septal segment,  reduction RV systolic function, moderate to severe mitral valve regurgitation, moderate tricuspid valve regurgitation,  -Per cards review of echo, RV is severely depressed - Difficult to assess urine output, bed weight also with fair bit of fluctuation,, continue Lasix  80 mg twice daily, repeat metolazone  -Hypoalbuminemia also contributing to third spacing -Limited medical therapy due to risk of hypotension and reduced GFR Continue midodrine   - This is her fourth hospitalization in 3 months, has been seen by palliative care in the recent past, discussed hospice with patient again, she continues to decline this  Acute on chronic hypoxic resp failure Advanced COPD, on 5L O2 at baseline - Stable, wean O2 down to 5 L, continue inhalers  Essential hypertension Continue midodrine    CAD S/P percutaneous coronary angioplasty No chest pain, no acute coronary syndrome,  Elevation of high sensitive troponin due to heart failure exacerbation  Continue aspirin  and statin   Chronic kidney disease, stage 3b (HCC) Hypokalemia, hyponatremia, AKI  stable  Hyperlipidemia Continue with statin therapy   GERD (gastroesophageal reflux disease) Continue proton pump inhibitor  Crohn's disease (HCC) Continue mesalamine , no signs of acute flare.   OSA (obstructive sleep apnea) Cpap   Anemia of chronic disease Cell count has been stable    History of pulmonary embolism Sp IV filter, not on oral anticoagulation   Macular degeneration Outpatient follow up   Obesity, class 1 Calculated BMI is 32.8         DVT prophylaxis: Add Lovenox  Code Status: DNR Family Communication: None present Disposition Plan: SNF likely 48 hours  Consultants:    Procedures:   Antimicrobials:    Objective: Vitals:   09/18/23 2338 09/19/23 0535 09/19/23 0821 09/19/23 1138  BP: (!) 93/59   106/60  Pulse: 92     Resp: 14     Temp: 97.8 F (36.6 C)   97.6 F (36.4 C)  TempSrc: Oral Oral  Oral  SpO2: 91%  94% 95%  Weight:  94.8 kg    Height:        Intake/Output Summary (Last 24 hours) at 09/19/2023 1234 Last data filed at 09/19/2023 1006 Gross per 24 hour  Intake 437 ml  Output 2300 ml  Net -1863 ml   Filed Weights   09/17/23 0515 09/18/23 0412 09/19/23 0535  Weight: 92.9 kg 97.1 kg 94.8 kg    Examination:  General exam: Appears calm and comfortable  HEENT: Positive JVD Respiratory system: Clear to auscultation Cardiovascular system: S1 & S2 heard, RRR.  Abd: nondistended, soft and nontender.Normal bowel sounds heard. Central nervous system: Alert and oriented. No focal neurological deficits. Extremities: 2 plus edema, chronic skin changes Skin: No rashes Psychiatry:  Mood & affect appropriate.     Data Reviewed:   CBC: Recent Labs  Lab 09/14/23 2125  WBC 7.6  NEUTROABS 5.8  HGB 9.7*  HCT 31.4*  MCV 91.3  PLT 228   Basic Metabolic Panel: Recent Labs  Lab 09/15/23 0225 09/16/23 0911 09/17/23 0324 09/17/23 1316 09/18/23 0231 09/19/23 0231  NA 135 133* 131* 131* 133* 134*  K 3.2* 3.3* 3.7 3.5 3.7 3.6  CL 88* 88* 88* 87* 88* 87*  CO2 32 28 34* 31 36* 34*  GLUCOSE 91 130* 86 127* 103* 94  BUN 67* 72* 75* 76* 72* 66*  CREATININE 2.03* 2.18* 2.09* 1.98* 1.97* 1.73*  CALCIUM  8.5* 8.3* 8.1* 8.1* 8.1* 8.2*  MG 1.8 1.7 2.1  --  2.3  --   PHOS 4.4  --   --   --   --   --    GFR: Estimated  Creatinine Clearance: 24.6 mL/min (A) (by C-G formula based on SCr of 1.73 mg/dL (H)). Liver Function Tests: Recent Labs  Lab 09/14/23 2125  AST 27  ALT 21  ALKPHOS 83  BILITOT 0.7  PROT 6.5  ALBUMIN  2.6*   No results for input(s): LIPASE, AMYLASE in the last 168 hours. No results for input(s): AMMONIA in the last 168 hours. Coagulation Profile: No results for input(s): INR, PROTIME in the last 168 hours. Cardiac Enzymes: No results for input(s): CKTOTAL, CKMB, CKMBINDEX, TROPONINI in the last 168 hours. BNP (last 3 results) No results for input(s): PROBNP in the last 8760 hours. HbA1C: No results for input(s): HGBA1C in the last 72 hours. CBG: No results for input(s): GLUCAP in the last 168 hours. Lipid Profile: No results for input(s): CHOL, HDL, LDLCALC, TRIG, CHOLHDL, LDLDIRECT in the last 72 hours. Thyroid Function Tests: No results for input(s): TSH, T4TOTAL, FREET4, T3FREE, THYROIDAB in the last 72 hours.  Anemia Panel: No results for input(s): VITAMINB12, FOLATE, FERRITIN, TIBC, IRON, RETICCTPCT in the last 72 hours. Urine analysis:    Component Value Date/Time   COLORURINE YELLOW 09/15/2023 0150   APPEARANCEUR CLEAR 09/15/2023 0150   LABSPEC 1.008 09/15/2023 0150   PHURINE 6.0 09/15/2023 0150   GLUCOSEU NEGATIVE 09/15/2023 0150   HGBUR MODERATE (A) 09/15/2023 0150   BILIRUBINUR NEGATIVE 09/15/2023 0150   KETONESUR NEGATIVE 09/15/2023 0150   PROTEINUR NEGATIVE 09/15/2023 0150   NITRITE NEGATIVE 09/15/2023 0150   LEUKOCYTESUR MODERATE (A) 09/15/2023 0150   Sepsis Labs: @LABRCNTIP (procalcitonin:4,lacticidven:4)  ) Recent Results (from the past 240 hours)  MRSA Next Gen by PCR, Nasal     Status: Abnormal   Collection Time: 09/16/23  1:31 PM   Specimen: Nasal Mucosa; Nasal Swab  Result Value Ref Range Status   MRSA by PCR Next Gen DETECTED (A) NOT DETECTED Final    Comment: RESULT CALLED TO, READ  BACK BY AND VERIFIED WITH: RN ANTONETTA PARAS [id number] FCP (NOTE) The GeneXpert MRSA Assay (FDA approved for NASAL specimens only), is one component of a comprehensive MRSA colonization surveillance program. It is not intended to diagnose MRSA infection nor to guide or monitor treatment for MRSA infections. Test performance is not FDA approved in patients less than 20 years old. Performed at [facility], [address] [place], [place] [address]      Radiology Studies: No results found.   Scheduled Meds:  acetaminophen   650 mg Oral QHS   aspirin  EC  81 mg Oral Daily   atorvastatin   40 mg Oral QHS   Chlorhexidine  Gluconate Cloth  6 each Topical Daily   diphenhydrAMINE   25 mg Oral QHS   docusate sodium   200 mg Oral Daily   fluticasone  furoate-vilanterol  1 puff Inhalation Daily   furosemide   80 mg Intravenous BID   furosemide   80  mg Intravenous BID   leptospermum manuka honey  1 Application Topical Daily   mesalamine   1,000 mg Oral BID   midodrine   5 mg Oral BID WC   montelukast   10 mg Oral Daily   mupirocin  ointment  1 Application Nasal BID   pantoprazole   40 mg Oral BID   potassium chloride   40 mEq Oral BID   sodium chloride  flush  3 mL Intravenous Q12H   Continuous Infusions:   LOS: 5 days    Time spent:    Sigurd Pac, MD [facility]   09/19/2023, 12:34 PM

## 2023-09-19 NOTE — Progress Notes (Signed)
 Rounding Note   Patient Name: Felicia Benson Date of Encounter: 09/19/2023  [facility] Cardiologist: Annabella Scarce, MD   Subjective  Pt says she is never SOB  No CP  Ankles improving     Scheduled Meds:  acetaminophen   650 mg Oral QHS   aspirin  EC  81 mg Oral Daily   atorvastatin   40 mg Oral QHS   Chlorhexidine  Gluconate Cloth  6 each Topical Daily   diphenhydrAMINE   25 mg Oral QHS   docusate sodium   200 mg Oral Daily   fluticasone  furoate-vilanterol  1 puff Inhalation Daily   furosemide   80 mg Intravenous BID   furosemide   80 mg Intravenous BID   leptospermum manuka honey  1 Application Topical Daily   mesalamine   1,000 mg Oral BID   metolazone   5 mg Oral Once   midodrine   5 mg Oral BID WC   montelukast   10 mg Oral Daily   mupirocin  ointment  1 Application Nasal BID   pantoprazole   40 mg Oral BID   potassium chloride   40 mEq Oral BID   sodium chloride  flush  3 mL Intravenous Q12H   Continuous Infusions:  PRN Meds: acetaminophen , guaiFENesin -dextromethorphan , ipratropium-albuterol , loratadine , ondansetron  (ZOFRAN ) IV, polyethylene glycol, sodium chloride    Vital Signs  Vitals:   09/18/23 2034 09/18/23 2338 09/19/23 0535 09/19/23 0821  BP:  (!) 93/59    Pulse: 89 92    Resp: 15 14    Temp:  97.8 F (36.6 C)    TempSrc:  Oral Oral   SpO2: 95% 91%  94%  Weight:   94.8 kg   Height:        Intake/Output Summary (Last 24 hours) at 09/19/2023 0950 Last data filed at 09/19/2023 0603 Gross per 24 hour  Intake 237 ml  Output 2800 ml  Net -2563 ml      09/19/2023    5:35 AM 09/18/2023    4:12 AM 09/17/2023    5:15 AM  Last 3 Weights  Weight (lbs) 209 lb 214 lb 204 lb 12.9 oz  Weight (kg) 94.802 kg 97.07 kg 92.9 kg      Telemetry SR  Paced  I personally reviewed  ECG  No new  - Personally Reviewed  Physical Exam  GEN: Obese 89 yo inno acute distress.    On 5L O2  Neck: Neck is full  Cardiac: RRR  II/VI systolic murmur LSB  PRom  P2 Respiratory:CTA   GI: Soft, nontender Ext   1+ LE edema Down from previous    Pneumatic compression sleeves on Labs High Sensitivity Troponin:   Recent Labs  Lab 09/14/23 2125 09/14/23 2339  TROPONINIHS 63* 62*     Chemistry Recent Labs  Lab 09/14/23 2125 09/15/23 0225 09/16/23 0911 09/17/23 0324 09/17/23 1316 09/18/23 0231 09/19/23 0231  NA 138   < > 133* 131* 131* 133* 134*  K 3.3*   < > 3.3* 3.7 3.5 3.7 3.6  CL 89*   < > 88* 88* 87* 88* 87*  CO2 35*   < > 28 34* 31 36* 34*  GLUCOSE 115*   < > 130* 86 127* 103* 94  BUN 68*   < > 72* 75* 76* 72* 66*  CREATININE 2.16*   < > 2.18* 2.09* 1.98* 1.97* 1.73*  CALCIUM  8.4*   < > 8.3* 8.1* 8.1* 8.1* 8.2*  MG  --    < > 1.7 2.1  --  2.3  --   PROT  6.5  --   --   --   --   --   --   ALBUMIN  2.6*  --   --   --   --   --   --   AST 27  --   --   --   --   --   --   ALT 21  --   --   --   --   --   --   ALKPHOS 83  --   --   --   --   --   --   BILITOT 0.7  --   --   --   --   --   --   GFRNONAA 21*   < > 21* 22* 23* 24* 28*  ANIONGAP 14   < > 17* 9 13 9 13    < > = values in this interval not displayed.    Lipids No results for input(s): CHOL, TRIG, HDL, LABVLDL, LDLCALC, CHOLHDL in the last 168 hours.  Hematology Recent Labs  Lab 09/14/23 2125  WBC 7.6  RBC 3.44*  HGB 9.7*  HCT 31.4*  MCV 91.3  MCH 28.2  MCHC 30.9  RDW 17.9*  PLT 228   Thyroid  Recent Labs  Lab 09/15/23 0225  TSH 1.019    BNP Recent Labs  Lab 09/14/23 2125  BNP 2,920.5*    DDimer No results for input(s): DDIMER in the last 168 hours.   Radiology  No results found.   Cardiac Studies Echo   July 2025 (report amended Sept 2025)   1. Left ventricular ejection fraction, by estimation, is 45 to 50%. The  left ventricle has mildly decreased function. The left ventricle  demonstrates global hypokinesis. There is severe asymmetric left  ventricular hypertrophy of the septal segment.  There is the interventricular septum is  flattened in systole and diastole,  consistent with right ventricular pressure and volume overload.   2. Right ventricular systolic function is severely reduced. The right  ventricular size is severely enlarged. There is severely elevated  pulmonary artery systolic pressure.   3. Right atrial size was mildly dilated.   4. The mitral valve is normal in structure. Moderate to severe mitral  valve regurgitation. No evidence of mitral stenosis.   5. Tricuspid valve regurgitation is moderate to severe.   6. The aortic valve is normal in structure. Aortic valve regurgitation is  mild. No aortic stenosis is present.   7. The inferior vena cava is dilated in size with >50% respiratory  variability, suggesting right atrial pressure of 8 mmHg.    Patient Profile   89 y.o. 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), COPD (on 5 L O2), asked to see for CHF  Assessment & Plan   1  CHF  Pt with cor pulmonale and mild LV dysfunction    On review,of echo, RVEF is severely depressed LVEF mildly depressed,   Echo also shows mild MR and mod/severe TR  Pt did not have any significant diuresis with 80 lasix  alone Yesterday added Zaroxylyn and she has responded tremendously   Cr actually improved To get again today    She would do well to have this intermittently after d/c     2  Hypotension   SBP 90s to 100s  On midodrine    3 Tricuspid regurgitation Mod/severe   4  CHB  s/p PPM July 2024  5 REnal  BUN / CR  66/1.73 today  Better    COPD   Pt with severe COPD Sats in 90s now on 5 L   Improved   6   OSA  pt has CPAP    7  Hx DVT/PE   Pt has IVC filter Jan 2025    Not on anticoagulation due to GI bleeding / Crohns Continue intermitt pneumatic compresion     For questions or updates, please contact [facility] Please consult [email or web address] for contact  info under     Signed, Vina Gull, MD  09/19/2023, 9:50 AM

## 2023-09-19 NOTE — Plan of Care (Signed)

## 2023-09-20 ENCOUNTER — Telehealth: Payer: Self-pay | Admitting: Cardiology

## 2023-09-20 DIAGNOSIS — Z66 Do not resuscitate: Secondary | ICD-10-CM

## 2023-09-20 DIAGNOSIS — Z515 Encounter for palliative care: Secondary | ICD-10-CM | POA: Diagnosis not present

## 2023-09-20 DIAGNOSIS — I5033 Acute on chronic diastolic (congestive) heart failure: Secondary | ICD-10-CM | POA: Diagnosis not present

## 2023-09-20 DIAGNOSIS — Z79899 Other long term (current) drug therapy: Secondary | ICD-10-CM

## 2023-09-20 DIAGNOSIS — Z7189 Other specified counseling: Secondary | ICD-10-CM | POA: Diagnosis not present

## 2023-09-20 LAB — BASIC METABOLIC PANEL WITH GFR
Anion gap: 7 (ref 5–15)
BUN: 70 mg/dL — ABNORMAL HIGH (ref 8–23)
CO2: 39 mmol/L — ABNORMAL HIGH (ref 22–32)
Calcium: 8 mg/dL — ABNORMAL LOW (ref 8.9–10.3)
Chloride: 88 mmol/L — ABNORMAL LOW (ref 98–111)
Creatinine, Ser: 1.87 mg/dL — ABNORMAL HIGH (ref 0.44–1.00)
GFR, Estimated: 25 mL/min — ABNORMAL LOW (ref >60)
Glucose, Bld: 90 mg/dL (ref 70–99)
Potassium: 4 mmol/L (ref 3.5–5.1)
Sodium: 134 mmol/L — ABNORMAL LOW (ref 135–145)

## 2023-09-20 MED ORDER — POTASSIUM CHLORIDE CRYS ER 20 MEQ PO TBCR
40.0000 meq | EXTENDED_RELEASE_TABLET | Freq: Once | ORAL | Status: AC
  Administered 2023-09-20: 40 meq via ORAL
  Filled 2023-09-20: qty 2

## 2023-09-20 MED ORDER — ENOXAPARIN SODIUM 30 MG/0.3ML IJ SOSY: 30.0000 mg | PREFILLED_SYRINGE | INTRAMUSCULAR | Status: DC

## 2023-09-20 MED ORDER — METOLAZONE 5 MG PO TABS
5.0000 mg | ORAL_TABLET | Freq: Once | ORAL | Status: AC
  Administered 2023-09-20: 5 mg via ORAL
  Filled 2023-09-20: qty 1

## 2023-09-20 MED ORDER — ENOXAPARIN SODIUM 40 MG/0.4ML IJ SOSY
40.0000 mg | PREFILLED_SYRINGE | INTRAMUSCULAR | Status: DC
  Administered 2023-09-20: 40 mg via SUBCUTANEOUS
  Filled 2023-09-20: qty 0.4

## 2023-09-20 NOTE — Progress Notes (Signed)
 PROGRESS NOTE    Felicia Benson  FMW:[id number] DOB: 1930-08-30 DOA: 09/14/2023 PCP: Leonidas  89 yo female, nursing home resident, with the past medical history of Crohn's disease, COPD, CKD stage 3B, history of DVT and PE sp IVC filter, who presented with dyspnea, lower extremity edema and weight gain.  - Frequent readmissions, seen by palliative care, continues to decline hospice  Subjective: Feels better, no events overnight   Assessment and Plan:  Acute on chronic diastolic heart failure  RV Failure, Pulm HTN Moderate to severe mitral regurgitation -Echo 7/25 w/ 45 to 50%, global hypokinesis, severe LVH of septal segment,  reduction RV systolic function, moderate to severe mitral valve regurgitation, moderate tricuspid valve regurgitation,  -Per cards review of echo, RV is severely depressed - Weight finally trending down, continue Lasix  80 mg twice daily, repeat metolazone  today -Hypoalbuminemia also contributing to third spacing -Limited medical therapy due to risk of hypotension and reduced GFR Continue midodrine   - This is her fourth hospitalization in 3 months, has been seen by palliative care in the recent past, discussed hospice with patient again, discussed with daughter and palliative care team as well today  Acute on chronic hypoxic resp failure Advanced COPD, on 5L O2 at baseline - Stable, wean O2 down to 5 L, continue inhalers  Essential hypertension Continue midodrine    CAD S/P percutaneous coronary angioplasty No chest pain, no acute coronary syndrome,  Elevation of high sensitive troponin due to heart failure exacerbation  Continue aspirin  and statin   Chronic kidney disease, stage 3b (HCC) Hypokalemia, hyponatremia, AKI  stable  Hyperlipidemia Continue with statin therapy   GERD (gastroesophageal reflux disease) Continue proton pump inhibitor  Crohn's disease (HCC) Continue mesalamine , no signs of acute flare.   OSA (obstructive sleep  apnea) Cpap   Anemia of chronic disease Cell count has been stable   History of pulmonary embolism Sp IV filter, not on oral anticoagulation   Macular degeneration Outpatient follow up   Obesity, class 1 Calculated BMI is 32.8         DVT prophylaxis: Lovenox  Code Status: DNR Family Communication: None present Disposition Plan: SNF likely 48 hours  Consultants:    Procedures:   Antimicrobials:    Objective: Vitals:   09/20/23 0353 09/20/23 0356 09/20/23 0732 09/20/23 1120  BP: 94/64 94/64 107/64   Pulse:  75 94   Resp:  16 16   Temp: 98.2 F (36.8 C)  97.7 F (36.5 C)   TempSrc: Oral  Skin   SpO2:  96% 99% 95%  Weight: 90.3 kg     Height:        Intake/Output Summary (Last 24 hours) at 09/20/2023 1232 Last data filed at 09/20/2023 1158 Gross per 24 hour  Intake 716 ml  Output 2450 ml  Net -1734 ml   Filed Weights   09/18/23 0412 09/19/23 0535 09/20/23 0353  Weight: 97.1 kg 94.8 kg 90.3 kg    Examination:  General exam: Appears calm and comfortable  HEENT: Positive JVD Respiratory system: Clear to auscultation Cardiovascular system: S1 & S2 heard, RRR.  Abd: nondistended, soft and nontender.Normal bowel sounds heard. Central nervous system: Alert and oriented. No focal neurological deficits. Extremities: 1-2 plus edema, chronic skin changes Skin: No rashes Psychiatry:  Mood & affect appropriate.     Data Reviewed:   CBC: Recent Labs  Lab 09/14/23 2125  WBC 7.6  NEUTROABS 5.8  HGB 9.7*  HCT 31.4*  MCV 91.3  PLT 228  Basic Metabolic Panel: Recent Labs  Lab 09/15/23 0225 09/16/23 0911 09/17/23 0324 09/17/23 1316 09/18/23 0231 09/19/23 0231 09/20/23 0245  NA 135 133* 131* 131* 133* 134* 134*  K 3.2* 3.3* 3.7 3.5 3.7 3.6 4.0  CL 88* 88* 88* 87* 88* 87* 88*  CO2 32 28 34* 31 36* 34* 39*  GLUCOSE 91 130* 86 127* 103* 94 90  BUN 67* 72* 75* 76* 72* 66* 70*  CREATININE 2.03* 2.18* 2.09* 1.98* 1.97* 1.73* 1.87*  CALCIUM  8.5*  8.3* 8.1* 8.1* 8.1* 8.2* 8.0*  MG 1.8 1.7 2.1  --  2.3  --   --   PHOS 4.4  --   --   --   --   --   --    GFR: Estimated Creatinine Clearance: 22.2 mL/min (A) (by C-G formula based on SCr of 1.87 mg/dL (H)). Liver Function Tests: Recent Labs  Lab 09/14/23 2125  AST 27  ALT 21  ALKPHOS 83  BILITOT 0.7  PROT 6.5  ALBUMIN  2.6*   No results for input(s): LIPASE, AMYLASE in the last 168 hours. No results for input(s): AMMONIA in the last 168 hours. Coagulation Profile: No results for input(s): INR, PROTIME in the last 168 hours. Cardiac Enzymes: No results for input(s): CKTOTAL, CKMB, CKMBINDEX, TROPONINI in the last 168 hours. BNP (last 3 results) No results for input(s): PROBNP in the last 8760 hours. HbA1C: No results for input(s): HGBA1C in the last 72 hours. CBG: No results for input(s): GLUCAP in the last 168 hours. Lipid Profile: No results for input(s): CHOL, HDL, LDLCALC, TRIG, CHOLHDL, LDLDIRECT in the last 72 hours. Thyroid Function Tests: No results for input(s): TSH, T4TOTAL, FREET4, T3FREE, THYROIDAB in the last 72 hours.  Anemia Panel: No results for input(s): VITAMINB12, FOLATE, FERRITIN, TIBC, IRON, RETICCTPCT in the last 72 hours. Urine analysis:    Component Value Date/Time   COLORURINE YELLOW 09/15/2023 0150   APPEARANCEUR CLEAR 09/15/2023 0150   LABSPEC 1.008 09/15/2023 0150   PHURINE 6.0 09/15/2023 0150   GLUCOSEU NEGATIVE 09/15/2023 0150   HGBUR MODERATE (A) 09/15/2023 0150   BILIRUBINUR NEGATIVE 09/15/2023 0150   KETONESUR NEGATIVE 09/15/2023 0150   PROTEINUR NEGATIVE 09/15/2023 0150   NITRITE NEGATIVE 09/15/2023 0150   LEUKOCYTESUR MODERATE (A) 09/15/2023 0150   Sepsis Labs: @LABRCNTIP (procalcitonin:4,lacticidven:4)  ) Recent Results (from the past 240 hours)  MRSA Next Gen by PCR, Nasal     Status: Abnormal   Collection Time: 09/16/23  1:31 PM   Specimen: Nasal Mucosa; Nasal Swab   Result Value Ref Range Status   MRSA by PCR Next Gen DETECTED (A) NOT DETECTED Final    Comment: RESULT CALLED TO, READ BACK BY AND VERIFIED WITH: RN ANTONETTA PARAS [id number] FCP (NOTE) The GeneXpert MRSA Assay (FDA approved for NASAL specimens only), is one component of a comprehensive MRSA colonization surveillance program. It is not intended to diagnose MRSA infection nor to guide or monitor treatment for MRSA infections. Test performance is not FDA approved in patients less than 45 years old. Performed at [facility], [address] [place], [place] [address]      Radiology Studies: No results found.   Scheduled Meds:  acetaminophen   650 mg Oral QHS   aspirin  EC  81 mg Oral Daily   atorvastatin   40 mg Oral QHS   diphenhydrAMINE   25 mg Oral QHS   docusate sodium   200 mg Oral Daily   fluticasone  furoate-vilanterol  1 puff Inhalation Daily  furosemide   80 mg Intravenous BID   leptospermum manuka honey  1 Application Topical Daily   mesalamine   1,000 mg Oral BID   midodrine   5 mg Oral BID WC   montelukast   10 mg Oral Daily   mupirocin  ointment  1 Application Nasal BID   pantoprazole   40 mg Oral BID   sodium chloride  flush  3 mL Intravenous Q12H   Continuous Infusions:   LOS: 6 days    Time spent:    Sigurd Pac, MD [facility]   09/20/2023, 12:32 PM

## 2023-09-20 NOTE — TOC Progression Note (Signed)
 Transition of Care [facility]) - Progression Note    Patient Details  Name: LISABETH MIAN MRN: [id number] Date of Birth: Apr 05, 1930  Transition of [facility]) CM/SW Contact  Luise JAYSON Pan, [place] Phone Number: 09/20/2023, 12:31 PM  Clinical Narrative:   Per daily meeting with treatment team, patient not medically stable for discharge today. Potentially can discharge tomorrow. CSW updated facility.   CSW will continue to follow.    Expected Discharge Plan: Long Term Nursing Home Barriers to Discharge: Continued Medical Work up               Expected Discharge Plan and Services In-house Referral: Clinical Social Work   Post Acute Care Choice: Skilled Nursing Facility Living arrangements for the past 2 months: Skilled Nursing Facility                                       Social Drivers of Health (SDOH) Interventions SDOH Screenings   Food Insecurity: No Food Insecurity (09/15/2023)  Housing: Low Risk  (09/15/2023)  Transportation Needs: No Transportation Needs (09/15/2023)  Utilities: Not At Risk (09/15/2023)  Social Connections: Moderately Integrated (09/15/2023)  Recent Concern: Social Connections - Moderately Isolated (07/24/2023)  Tobacco Use: Medium Risk (09/14/2023)    Readmission Risk Interventions    07/26/2023   11:43 AM 07/24/2022    2:49 PM  Readmission Risk Prevention Plan  Transportation Screening Complete Complete  PCP or Specialist Appt within 5-7 Days  Complete  Home Care Screening  Complete  Medication Review (RN CM)  Complete  Medication Review (RN Care Manager) Complete   PCP or Specialist appointment within 3-5 days of discharge Complete   HRI or Home Care Consult Complete   Palliative Care Screening Not Applicable   Skilled Nursing Facility Not Applicable

## 2023-09-20 NOTE — Progress Notes (Signed)
 Physical Therapy Treatment Patient Details Name: Felicia Benson MRN: [id number] DOB: [birth year] Today's Date: 09/20/2023   History of Present Illness 89 y.o. female adm 09/14/23 from [facility] for LB edema, SOB and CHF exacerbation. PMHx: AAA, CHF, Crohn's Disease, GERD, GIB, HTN, OSA, CHB s/p PPM, DVT with IVC filter,  Rt femur fx s/p IM nail, COPD on 6L O2, HLD, CKD, CAD    PT Comments  Pt pleasant and eager to visit son who is also an admitted pt. Pt able to transfer OOB to transport chair, maintain sitting balance throughout travel and visit and return to room and bed. Pt with desaturation to 85% with transfers on 5-6L and required bump to 8L to recover then back to 5L in bed and 6L in chair with pt stating no SOB with HR 82-97. Pt denied gait or HEp trials this session as fatigued from visiting son but very appreciative of time and visit. Will continue to follow, encouraged OOB to chair.     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 return to bed assist to lift legs. max assist to scoot to [facility]    Transfers Overall transfer level: Needs assistance Equipment  used: Rolling walker (2 wheels) Transfers: Sit to/from Stand Sit to Stand: Min assist           General transfer comment: min assist to rise from bed and chair with RW present. pt able to take 2-3 steps between surfaces. Pivoted bed <> transport chair. Denied up in recliner after visiting son    Ambulation/Gait               General Gait Details: pt denied this session   Stairs             Wheelchair Mobility     Tilt Bed    Modified Rankin (Stroke Patients Only)       Balance Overall balance assessment: Needs assistance Sitting-balance support: Feet supported, No upper extremity supported Sitting balance-Leahy Scale: Fair     Standing balance support: Bilateral upper extremity supported, During functional activity, Reliant on assistive device for balance Standing balance-Leahy Scale: Poor Standing balance comment: RW in standing                            Communication Communication Communication: Impaired Factors Affecting Communication: Hearing impaired  Cognition Arousal: Alert Behavior During Therapy: WFL for tasks assessed/performed   PT - Cognitive impairments: No apparent impairments                         Following commands: Intact      Cueing Cueing Techniques: Verbal cues  Exercises  General Comments        Pertinent Vitals/Pain Pain Assessment Pain Assessment: No/denies pain    Home Living                          Prior Function            PT Goals (current goals can now be found in the care plan section) Progress towards PT goals: Progressing toward goals    Frequency    Min 1X/week      PT Plan      Co-evaluation              AM-PAC PT 6 Clicks Mobility   Outcome Measure  Help needed turning from your back to your side while in a flat bed without using bedrails?: A Little Help needed moving from lying on your back to sitting on the side of a flat bed without  using bedrails?: A Little Help needed moving to and from a bed to a chair (including a wheelchair)?: A Little Help needed standing up from a chair using your arms (e.g., wheelchair or bedside chair)?: A Little Help needed to walk in hospital room?: A Lot Help needed climbing 3-5 steps with a railing? : Total 6 Click Score: 15    End of Session Equipment Utilized During Treatment: Gait belt;Oxygen Activity Tolerance: Patient tolerated treatment well Patient left: in bed;with call bell/phone within reach Nurse Communication: Mobility status PT Visit Diagnosis: Other abnormalities of gait and mobility (R26.89);Difficulty in walking, not elsewhere classified (R26.2);Muscle weakness (generalized) (M62.81)     Time: 8946-8851 PT Time Calculation (min) (ACUTE ONLY): 55 min  Charges:    $Therapeutic Activity: 23-37 mins PT General Charges $$ ACUTE PT VISIT: 1 Visit                     Lenoard SQUIBB, PT Acute Rehabilitation Services Office: [phone]    Lenoard NOVAK Ramces Shomaker 09/20/2023, 12:31 PM

## 2023-09-20 NOTE — Progress Notes (Addendum)
 [facility] Liaison note:  This patient is currently enrolled in [facility] outpatient-based palliative care.  Referral received to follow up for hospice services after discharge back to facility.  Hospital Liaison will continue to follow for discharge disposition.  Please call for any outpatient based palliative care related questions or concerns. Thank you, Hunter Seip, BSN, [facility] liaison  [phone]

## 2023-09-20 NOTE — Telephone Encounter (Signed)
 BMP 1 week please. I think she lives in a facility so not sure how that works.

## 2023-09-20 NOTE — Progress Notes (Signed)
 Daily Progress Note   Patient Name: Felicia Benson       Date: 09/20/2023 DOB: [birth year]  Age: 89 y.o. MRN#: [id number] Attending Physician: Felicia Frames, MD Primary Care Physician: Felicia Benson Date: 09/14/2023  Reason for Consultation/Follow-up: Establishing goals of care  Subjective: No complaints  Length of Stay: 6  Current Medications: Scheduled Meds:   acetaminophen   650 mg Oral QHS   aspirin  EC  81 mg Oral Daily   atorvastatin   40 mg Oral QHS   diphenhydrAMINE   25 mg Oral QHS   docusate sodium   200 mg Oral Daily   [START ON 09/21/2023] enoxaparin  (LOVENOX ) injection  30 mg Subcutaneous Q24H   fluticasone  furoate-vilanterol  1 puff Inhalation Daily   furosemide   80 mg Intravenous BID   leptospermum manuka honey  1 Application Topical Daily   mesalamine   1,000 mg Oral BID   midodrine   5 mg Oral BID WC   montelukast   10 mg Oral Daily   mupirocin  ointment  1 Application Nasal BID   pantoprazole   40 mg Oral BID   sodium chloride  flush  3 mL Intravenous Q12H    Continuous Infusions:   PRN Meds: acetaminophen , guaiFENesin -dextromethorphan , ipratropium-albuterol , loratadine , ondansetron  (ZOFRAN ) IV, polyethylene glycol, sodium chloride   Physical Exam Constitutional:      General: She is not in acute distress.    Appearance: She is ill-appearing.  Pulmonary:     Effort: Pulmonary effort is normal.  Skin:    General: Skin is warm and dry.  Neurological:     Mental Status: She is alert and oriented to person, place, and time.             Vital Signs: BP 107/64 (BP Location: Left Wrist)   Pulse 94   Temp 97.7 F (36.5 C) (Skin)   Resp 16   Ht 5' 6 (1.676 m)   Wt 90.3 kg   SpO2 95%   BMI 32.12 kg/m  SpO2: SpO2: 95 % O2 Device: O2 Device: Nasal Cannula O2 Flow Rate:  O2 Flow Rate (L/min): 6 L/min  Intake/output summary:  Intake/Output Summary (Last 24 hours) at 09/20/2023 1555 Last data filed at 09/20/2023 1300 Gross per 24 hour  Intake 952 ml  Output 1650 ml  Net -698 ml   LBM: Last BM Date : 09/18/23 Baseline Weight: Weight: 92.2 kg Most recent weight: Weight: 90.3 kg   Patient Active Problem List   Diagnosis Date Noted   Pressure injury of skin 09/15/2023   History of pulmonary embolism 08/18/2023   Obesity, class 1 08/18/2023   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, 41-49%) (HCC) 08/17/2023   Hypoxic episode 07/23/2023   Acute on chronic respiratory failure with hypoxemia (HCC) 06/25/2023   Myocardial injury 04/08/2023   Sacral ulcer (HCC) 04/08/2023   Hypokalemia 04/08/2023   Goals of care, counseling/discussion 04/08/2023   Acute renal failure superimposed on stage 3b chronic kidney disease (HCC) 03/13/2023   Chronic respiratory failure with hypoxia (HCC) 03/08/2023   Elevated troponin 03/08/2023   Acute exacerbation of CHF (congestive heart failure) (HCC) 03/07/2023   Right ventricular failure (HCC) 02/15/2023   Shortness of breath 02/13/2023   DVT (deep  venous thrombosis) (HCC) 02/11/2023   Acute pulmonary embolism (HCC) 02/08/2023   Acute respiratory failure with hypoxia (HCC) 02/08/2023   CAP (community acquired pneumonia) 02/07/2023   Filling defect on imaging study 02/07/2023   CKD (chronic kidney disease) 02/07/2023   Diverticular hemorrhage 12/28/2022   Ileus, postoperative (HCC) 11/04/2022   Closed right hip fracture (HCC) 10/28/2022   Fall at home, initial encounter 10/28/2022   History of COPD 10/28/2022   Allergic rhinitis 10/28/2022   Anemia of chronic disease 10/28/2022   Chronic rhinitis 08/06/2022   OSA (obstructive sleep apnea) 08/06/2022   Heart failure, acute diastolic (HCC) 07/21/2022   Chronic kidney disease, stage 3b (HCC) 07/21/2022   Thoracic aortic aneurysm (HCC)  07/21/2022   GERD (gastroesophageal reflux disease) 07/21/2022   Pancreatic lesion 07/21/2022   Macular degeneration 07/21/2022   Hypocalcemia 07/21/2022   Insomnia 07/21/2022   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   TIA (transient ischemic attack) 12/31/2020   Acute on chronic diastolic heart failure (HCC) 05/19/2019   Acute CHF (congestive heart failure) (HCC) 05/18/2019   Chronic hypoxic respiratory failure (HCC) 05/18/2019   COPD (chronic obstructive pulmonary disease) (HCC) 05/18/2019   History of cardiac pacemaker 05/18/2019   AAA (abdominal aortic aneurysm) 05/18/2019   Hyperlipidemia 05/18/2019   Abdominal pain in female 05/30/2015   Essential hypertension 05/30/2015   Crohn's disease (HCC) 05/30/2015   Gastrointestinal hemorrhage with melena 05/30/2015   Blood loss anemia 05/30/2015   Hyponatremia 05/30/2015   Kidney disease 05/30/2015   Normocytic anemia 05/30/2015   CAD S/P percutaneous coronary angioplasty 05/30/2015   Acute blood loss anemia 05/30/2015   Heme + stool     Palliative Care Assessment & Plan   HPI: 89 y.o. female  with past medical history of heart failure, pulmonary hypertension, CAD, heart block s/p PM, COPD, Crohn's, DVT/PE, CKD admitted on 09/14/2023 with complaints of lower extremity and facial edema due to congestive heart failure. Palliative medicine consulted for goals of care due to end stage heart failure.    Assessment: Briefly met with Felicia Benson who has no concerns - helping coordinate visit to her son.   Received update from Dr Felicia and separately spoke with daughter Felicia Benson.  Felicia Benson shares she recognizes that patient is declining and having increased frequency in hospitalizations - she recognizes patient can't continue this way, too hard on her. She sees a role for hospice in this situation to support her mom and help manage symptoms and avoid hospitalizations.   However, they are in a difficult situation with patient's son also being  hospitalized. She tells me that though she sees the role for hospice she would like to handle this and discuss it more with patient after her discharge. Wants to get her back to her familiar environment before having these discussions. We review that patient is connected to the outpatient palliative program through [facility]. Review I can request they follow up with her soon after discharge to help navigate this situation. Felicia Benson agrees to this plan.  Discussed situation with Misty with [facility] hospice and palliative - she will relay information to herm team who will follow up after discharge.   Recommendations/Plan: DNR/DNI Family interested in support of hospice to avoid repeated hosptializations but would like to wait until after discharge to facilitate - outpatient palliative to follow up soon after discharge to navigate  Care plan was discussed with daughter and Dr Felicia  Thank you for allowing the Palliative Medicine Team to assist in the care of this  patient.   Total Time 40 minutes Prolonged Time Billed  no   Time spent includes: Detailed review of medical records (labs, imaging, vital signs), medically appropriate exam, discussion with treatment team, counseling and educating patient, family and/or staff, documenting clinical information, medication management and coordination of care.     *Please note that this is a verbal dictation therefore any spelling or grammatical errors are due to the Dragon [employer] One system interpretation.  Tobey Jama Barnacle, DNP, [facility] Palliative Medicine Team Team Phone # [phone]  Pager [phone]

## 2023-09-20 NOTE — Progress Notes (Signed)
 Rounding Note   Patient Name: Felicia Benson Date of Encounter: 09/20/2023  [facility] Cardiologist: Annabella Scarce, MD   Subjective  Pt says she is never SOB  No CP  Ankles continue to improve     Scheduled Meds:  acetaminophen   650 mg Oral QHS   aspirin  EC  81 mg Oral Daily   atorvastatin   40 mg Oral QHS   Chlorhexidine  Gluconate Cloth  6 each Topical Daily   diphenhydrAMINE   25 mg Oral QHS   docusate sodium   200 mg Oral Daily   fluticasone  furoate-vilanterol  1 puff Inhalation Daily   furosemide   80 mg Intravenous BID   leptospermum manuka honey  1 Application Topical Daily   mesalamine   1,000 mg Oral BID   midodrine   5 mg Oral BID WC   montelukast   10 mg Oral Daily   mupirocin  ointment  1 Application Nasal BID   pantoprazole   40 mg Oral BID   sodium chloride  flush  3 mL Intravenous Q12H   Continuous Infusions:  PRN Meds: acetaminophen , guaiFENesin -dextromethorphan , ipratropium-albuterol , loratadine , ondansetron  (ZOFRAN ) IV, polyethylene glycol, sodium chloride    Vital Signs  Vitals:   09/19/23 2345 09/19/23 2357 09/20/23 0353 09/20/23 0356  BP: 91/60  94/64 94/64  Pulse: 85 84  75  Resp: (!) 21 16  16   Temp:   98.2 F (36.8 C)   TempSrc:   Oral   SpO2: 93% 92%  96%  Weight:   90.3 kg   Height:        Intake/Output Summary (Last 24 hours) at 09/20/2023 0637 Last data filed at 09/20/2023 0359 Gross per 24 hour  Intake 560 ml  Output 1550 ml  Net -990 ml   Net neg   3.9 L      09/20/2023    3:53 AM 09/19/2023    5:35 AM 09/18/2023    4:12 AM  Last 3 Weights  Weight (lbs) 199 lb 209 lb 214 lb  Weight (kg) 90.266 kg 94.802 kg 97.07 kg      Telemetry SR  Paced  I personally reviewed  ECG  No new  - Personally Reviewed  Physical Exam  GEN: Obese 89 yo inno acute distress.    On 5L O2  Neck: Neck is full  Cardiac: RRR  II/VI systolic murmur LSB  PRom P2 Respiratory:CTA   GI: Soft, nontender Ext   1+ LE edema Labs High  Sensitivity Troponin:   Recent Labs  Lab 09/14/23 2125 09/14/23 2339  TROPONINIHS 63* 62*     Chemistry Recent Labs  Lab 09/14/23 2125 09/15/23 0225 09/16/23 0911 09/17/23 0324 09/17/23 1316 09/18/23 0231 09/19/23 0231 09/20/23 0245  NA 138   < > 133* 131*   < > 133* 134* 134*  K 3.3*   < > 3.3* 3.7   < > 3.7 3.6 4.0  CL 89*   < > 88* 88*   < > 88* 87* 88*  CO2 35*   < > 28 34*   < > 36* 34* 39*  GLUCOSE 115*   < > 130* 86   < > 103* 94 90  BUN 68*   < > 72* 75*   < > 72* 66* 70*  CREATININE 2.16*   < > 2.18* 2.09*   < > 1.97* 1.73* 1.87*  CALCIUM  8.4*   < > 8.3* 8.1*   < > 8.1* 8.2* 8.0*  MG  --    < > 1.7 2.1  --  2.3  --   --   PROT 6.5  --   --   --   --   --   --   --   ALBUMIN  2.6*  --   --   --   --   --   --   --   AST 27  --   --   --   --   --   --   --   ALT 21  --   --   --   --   --   --   --   ALKPHOS 83  --   --   --   --   --   --   --   BILITOT 0.7  --   --   --   --   --   --   --   GFRNONAA 21*   < > 21* 22*   < > 24* 28* 25*  ANIONGAP 14   < > 17* 9   < > 9 13 7    < > = values in this interval not displayed.    Lipids No results for input(s): CHOL, TRIG, HDL, LABVLDL, LDLCALC, CHOLHDL in the last 168 hours.  Hematology Recent Labs  Lab 09/14/23 2125  WBC 7.6  RBC 3.44*  HGB 9.7*  HCT 31.4*  MCV 91.3  MCH 28.2  MCHC 30.9  RDW 17.9*  PLT 228   Thyroid  Recent Labs  Lab 09/15/23 0225  TSH 1.019    BNP Recent Labs  Lab 09/14/23 2125  BNP 2,920.5*    DDimer No results for input(s): DDIMER in the last 168 hours.   Radiology  No results found.   Cardiac Studies Echo   July 2025 (report amended Sept 2025)   1. Left ventricular ejection fraction, by estimation, is 45 to 50%. The  left ventricle has mildly decreased function. The left ventricle  demonstrates global hypokinesis. There is severe asymmetric left  ventricular hypertrophy of the septal segment.  There is the interventricular septum is flattened in systole  and diastole,  consistent with right ventricular pressure and volume overload.   2. Right ventricular systolic function is severely reduced. The right  ventricular size is severely enlarged. There is severely elevated  pulmonary artery systolic pressure.   3. Right atrial size was mildly dilated.   4. The mitral valve is normal in structure. Moderate to severe mitral  valve regurgitation. No evidence of mitral stenosis.   5. Tricuspid valve regurgitation is moderate to severe.   6. The aortic valve is normal in structure. Aortic valve regurgitation is  mild. No aortic stenosis is present.   7. The inferior vena cava is dilated in size with >50% respiratory  variability, suggesting right atrial pressure of 8 mmHg.    Patient Profile   89 y.o. 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), COPD (on 5 L O2), asked to see for CHF  Assessment & Plan   1  CHF  Pt with cor pulmonale and mild LV dysfunction    On review,of echo, RVEF is severely depressed LVEF mildly depressed,   Echo also shows mild MR and mod/severe TR Pt has done well when metolazone  added to regimen  Further diuresis    She would do well to have this  intermittently after d/c    I think she could go with metolazone  2x per week  Lasix  on other days     2  Hypotension   SBP remains 90s to 100s   On midodrine    3 Tricuspid regurgitation Mod/severe   4  CHB  s/p PPM July 2024  5 REnal  BUN / CR 70/1.87 today    COPD   Pt with severe COPD Sats in 90s now on 5 L   Improved   6   OSA  pt has CPAP    7  Hx DVT/PE   Pt has IVC filter Jan 2025    Not on anticoagulation due to GI bleeding / Crohns Continue intermitt pneumatic compresion  I will arrange follow up in clinic   WIll sign off for now   Call with questions.   For questions or updates, please contact [facility] Please  consult [email or web address] for contact info under     Signed, Vina Gull, MD  09/20/2023, 6:37 AM

## 2023-09-20 NOTE — Progress Notes (Signed)
 OT Cancellation Note  Patient Details Name: Felicia Benson MRN: [id number] DOB: 08-19-1930   Cancelled Treatment:    Reason Eval/Treat Not Completed: Patient declined, no reason specified (reports fatigued from visiting son)  Ely Molt 09/20/2023, 3:00 PM

## 2023-09-21 ENCOUNTER — Inpatient Hospital Stay (HOSPITAL_COMMUNITY)

## 2023-09-21 DIAGNOSIS — I5033 Acute on chronic diastolic (congestive) heart failure: Secondary | ICD-10-CM | POA: Diagnosis not present

## 2023-09-21 LAB — BASIC METABOLIC PANEL WITH GFR
Anion gap: 12 (ref 5–15)
BUN: 75 mg/dL — ABNORMAL HIGH (ref 8–23)
CO2: 34 mmol/L — ABNORMAL HIGH (ref 22–32)
Calcium: 8 mg/dL — ABNORMAL LOW (ref 8.9–10.3)
Chloride: 88 mmol/L — ABNORMAL LOW (ref 98–111)
Creatinine, Ser: 2.01 mg/dL — ABNORMAL HIGH (ref 0.44–1.00)
GFR, Estimated: 23 mL/min — ABNORMAL LOW (ref >60)
Glucose, Bld: 164 mg/dL — ABNORMAL HIGH (ref 70–99)
Potassium: 3.8 mmol/L (ref 3.5–5.1)
Sodium: 134 mmol/L — ABNORMAL LOW (ref 135–145)

## 2023-09-21 LAB — CBC
HCT: 29.4 % — ABNORMAL LOW (ref 36.0–46.0)
Hemoglobin: 8.9 g/dL — ABNORMAL LOW (ref 12.0–15.0)
MCH: 27.6 pg (ref 26.0–34.0)
MCHC: 30.3 g/dL (ref 30.0–36.0)
MCV: 91 fL (ref 80.0–100.0)
Platelets: 273 K/uL (ref 150–400)
RBC: 3.23 MIL/uL — ABNORMAL LOW (ref 3.87–5.11)
RDW: 17.9 % — ABNORMAL HIGH (ref 11.5–15.5)
WBC: 6.4 K/uL (ref 4.0–10.5)
nRBC: 0 % (ref 0.0–0.2)

## 2023-09-21 MED ORDER — LIDOCAINE 4 % EX CREA
TOPICAL_CREAM | Freq: Every day | CUTANEOUS | Status: DC | PRN
  Administered 2023-09-21: 1 via TOPICAL
  Filled 2023-09-21: qty 5

## 2023-09-21 MED ORDER — WITCH HAZEL-GLYCERIN EX PADS
MEDICATED_PAD | CUTANEOUS | Status: DC | PRN
  Filled 2023-09-21: qty 100

## 2023-09-21 MED ORDER — HYDROCORTISONE (PERIANAL) 2.5 % EX CREA
TOPICAL_CREAM | Freq: Three times a day (TID) | CUTANEOUS | Status: DC | PRN
  Filled 2023-09-21: qty 28.35

## 2023-09-21 MED ORDER — BENZONATATE 100 MG PO CAPS
100.0000 mg | ORAL_CAPSULE | Freq: Three times a day (TID) | ORAL | Status: DC
Start: 2023-09-21 — End: 2023-09-23
  Administered 2023-09-21 – 2023-09-22 (×6): 100 mg via ORAL
  Filled 2023-09-21 (×6): qty 1

## 2023-09-21 MED ORDER — LACTULOSE 10 GM/15ML PO SOLN
20.0000 g | Freq: Two times a day (BID) | ORAL | Status: DC
  Administered 2023-09-21 – 2023-09-22 (×2): 20 g via ORAL
  Filled 2023-09-21 (×4): qty 30

## 2023-09-21 MED ORDER — METOLAZONE 5 MG PO TABS
5.0000 mg | ORAL_TABLET | Freq: Once | ORAL | Status: AC
  Administered 2023-09-21: 5 mg via ORAL
  Filled 2023-09-21: qty 1

## 2023-09-21 NOTE — Plan of Care (Addendum)
 RN reported that patient was coughing pretty hard and had 1 episode of blood-tinged sputum at 5:35 AM.  Patient is hemodynamically stable. This patient is being admitted for acute on chronic CHF exacerbation, acute on chronic hypoxic respiratory failure.  Chronic medical history include hypertension, CAD, PE not on anticoagulation status post IVC filter.  -Patient has previous history of PE.  However in the setting of low GFR 23% unable to obtain CTA chest.  Also patient has hypoxic respiratory failure and VQ scan would not be adequate. -Will obtain CT chest without contrast looking for any lung lesion/pneumonia/cavitary lesion. -Hold IV Lovenox .  Felicia Schewe, MD [facility] 09/21/2023, 5:42 AM

## 2023-09-21 NOTE — Progress Notes (Signed)
 PROGRESS NOTE    Felicia Benson  FMW:[id number] DOB: 26-Jan-1930 DOA: 09/14/2023 PCP: [facility]  89 yo female, nursing home resident, with the past medical history of Crohn's disease, COPD, CKD stage 3B, history of DVT and PE sp IVC filter, who presented with dyspnea, lower extremity edema and weight gain.  - Frequent readmissions, seen by palliative care, continues to decline hospice  Subjective: More short of breath overnight, coughed up bloody phlegm  Assessment and Plan:  Acute on chronic diastolic heart failure  RV Failure, Pulm HTN Moderate to severe mitral regurgitation -Echo 7/25 w/ 45 to 50%, global hypokinesis, severe LVH of septal segment,  reduction RV systolic function, moderate to severe mitral valve regurgitation, moderate tricuspid valve regurgitation,  -Per cards review of echo, RV is severely depressed - Weight finally trending down, continue Lasix  80 mg twice daily, repeat metolazone  today -Switch to oral torsemide  tomorrow, add metolazone  twice weekly at discharge -Hypoalbuminemia also contributing to third spacing -Limited medical therapy due to risk of hypotension and reduced GFR Continue midodrine   - This is her fourth hospitalization in 3 months, has been seen by palliative care in the recent past, discussed hospice with patient again, discussed with daughter and palliative care team again  Acute on chronic hypoxic resp failure Advanced COPD, on 5L O2 at baseline - Stable, wean O2 down to 5 L, continue inhalers  Essential hypertension Continue midodrine    CAD S/P percutaneous coronary angioplasty No chest pain, no acute coronary syndrome,  Elevation of high sensitive troponin due to heart failure exacerbation  Continue aspirin  and statin   Chronic kidney disease, stage 3b (HCC) Hypokalemia, hyponatremia, AKI  stable  Hyperlipidemia Continue with statin therapy   GERD (gastroesophageal reflux disease) Continue proton pump inhibitor  Crohn's disease  (HCC) Continue mesalamine , no signs of acute flare.   OSA (obstructive sleep apnea) Cpap   Anemia of chronic disease Cell count has been stable   History of pulmonary embolism Sp IV filter, not on oral anticoagulation   Macular degeneration Outpatient follow up   Obesity, class 1 Calculated BMI is 32.8         DVT prophylaxis: Lovenox  Code Status: DNR Family Communication: None present called and updated daughter yesterday Disposition Plan: SNF likely tomorrow  Consultants:    Procedures:   Antimicrobials:    Objective: Vitals:   09/21/23 0815 09/21/23 0831 09/21/23 0913 09/21/23 1059  BP: 91/64  102/70 120/79  Pulse: 89 87 85 90  Resp: 17 16 17 15   Temp: 98.1 F (36.7 C)   98.2 F (36.8 C)  TempSrc: Oral   Oral  SpO2: 92% 98% 96% 92%  Weight:      Height:        Intake/Output Summary (Last 24 hours) at 09/21/2023 1317 Last data filed at 09/21/2023 1140 Gross per 24 hour  Intake 240 ml  Output 1400 ml  Net -1160 ml   Filed Weights   09/19/23 0535 09/20/23 0353 09/21/23 0425  Weight: 94.8 kg 90.3 kg 87.5 kg    Examination:  General exam: Appears calm and comfortable  HEENT: Positive JVD Respiratory system: Clear to auscultation Cardiovascular system: S1 & S2 heard, RRR.  Abd: nondistended, soft and nontender.Normal bowel sounds heard. Central nervous system: Alert and oriented. No focal neurological deficits. Extremities: 1plus edema, chronic skin changes Skin: No rashes Psychiatry:  Mood & affect appropriate.     Data Reviewed:   CBC: Recent Labs  Lab 09/14/23 2125 09/21/23 0235  WBC 7.6 6.4  NEUTROABS 5.8  --   HGB 9.7* 8.9*  HCT 31.4* 29.4*  MCV 91.3 91.0  PLT 228 273   Basic Metabolic Panel: Recent Labs  Lab 09/15/23 0225 09/16/23 0911 09/17/23 0324 09/17/23 1316 09/18/23 0231 09/19/23 0231 09/20/23 0245 09/21/23 0235  NA 135 133* 131* 131* 133* 134* 134* 134*  K 3.2* 3.3* 3.7 3.5 3.7 3.6 4.0 3.8  CL 88* 88* 88*  87* 88* 87* 88* 88*  CO2 32 28 34* 31 36* 34* 39* 34*  GLUCOSE 91 130* 86 127* 103* 94 90 164*  BUN 67* 72* 75* 76* 72* 66* 70* 75*  CREATININE 2.03* 2.18* 2.09* 1.98* 1.97* 1.73* 1.87* 2.01*  CALCIUM  8.5* 8.3* 8.1* 8.1* 8.1* 8.2* 8.0* 8.0*  MG 1.8 1.7 2.1  --  2.3  --   --   --   PHOS 4.4  --   --   --   --   --   --   --    GFR: Estimated Creatinine Clearance: 20.3 mL/min (A) (by C-G formula based on SCr of 2.01 mg/dL (H)). Liver Function Tests: Recent Labs  Lab 09/14/23 2125  AST 27  ALT 21  ALKPHOS 83  BILITOT 0.7  PROT 6.5  ALBUMIN  2.6*   No results for input(s): LIPASE, AMYLASE in the last 168 hours. No results for input(s): AMMONIA in the last 168 hours. Coagulation Profile: No results for input(s): INR, PROTIME in the last 168 hours. Cardiac Enzymes: No results for input(s): CKTOTAL, CKMB, CKMBINDEX, TROPONINI in the last 168 hours. BNP (last 3 results) No results for input(s): PROBNP in the last 8760 hours. HbA1C: No results for input(s): HGBA1C in the last 72 hours. CBG: No results for input(s): GLUCAP in the last 168 hours. Lipid Profile: No results for input(s): CHOL, HDL, LDLCALC, TRIG, CHOLHDL, LDLDIRECT in the last 72 hours. Thyroid Function Tests: No results for input(s): TSH, T4TOTAL, FREET4, T3FREE, THYROIDAB in the last 72 hours.  Anemia Panel: No results for input(s): VITAMINB12, FOLATE, FERRITIN, TIBC, IRON, RETICCTPCT in the last 72 hours. Urine analysis:    Component Value Date/Time   COLORURINE YELLOW 09/15/2023 0150   APPEARANCEUR CLEAR 09/15/2023 0150   LABSPEC 1.008 09/15/2023 0150   PHURINE 6.0 09/15/2023 0150   GLUCOSEU NEGATIVE 09/15/2023 0150   HGBUR MODERATE (A) 09/15/2023 0150   BILIRUBINUR NEGATIVE 09/15/2023 0150   KETONESUR NEGATIVE 09/15/2023 0150   PROTEINUR NEGATIVE 09/15/2023 0150   NITRITE NEGATIVE 09/15/2023 0150   LEUKOCYTESUR MODERATE (A) 09/15/2023 0150    Sepsis Labs: @LABRCNTIP (procalcitonin:4,lacticidven:4)  ) Recent Results (from the past 240 hours)  MRSA Next Gen by PCR, Nasal     Status: Abnormal   Collection Time: 09/16/23  1:31 PM   Specimen: Nasal Mucosa; Nasal Swab  Result Value Ref Range Status   MRSA by PCR Next Gen DETECTED (A) NOT DETECTED Final    Comment: RESULT CALLED TO, READ BACK BY AND VERIFIED WITH: RN ANTONETTA PARAS [id number] FCP (NOTE) The GeneXpert MRSA Assay (FDA approved for NASAL specimens only), is one component of a comprehensive MRSA colonization surveillance program. It is not intended to diagnose MRSA infection nor to guide or monitor treatment for MRSA infections. Test performance is not FDA approved in patients less than 34 years old. Performed at [facility], [address], [place], [place] [address]      Radiology Studies: CT CHEST WO CONTRAST Result Date: 09/21/2023 EXAM: CT CHEST WITHOUT CONTRAST 09/21/2023 06:47:53 AM TECHNIQUE: CT of the  chest was performed without the administration of intravenous contrast. Multiplanar reformatted images are provided for review. Automated exposure control, iterative reconstruction, and/or weight based adjustment of the mA/kV was utilized to reduce the radiation dose to as low as reasonably achievable. COMPARISON: 02/07/2023 CLINICAL HISTORY: Rule out pneumonia/cavitary lung lesion. Patient has hemoptysis 1 episode. FINDINGS: MEDIASTINUM: Heart size is upper limits of normal. Left chest wall pacer with leads in the right atrium and right ventricle. No pericardial effusion. Increased caliber of the main pulmonary artery suggests pulmonary artery hypertension. Stable dilatation of the ascending thoracic aorta measuring 4.3 cm. Multinodular thyroid gland is again noted. This appears unchanged from the previous study. In a patient with limited life expectancy or multiple comorbidities, no follow-up imaging is recommended. Surgical clips noted within the region of the  left lobe of thyroid gland. LYMPH NODES: No pathologically enlarged mediastinal lymph nodes. Calcified subcarinal and right hilar nodes compatible with remote granulomatous disease. LUNGS AND PLEURA: Small bilateral pleural effusions. Mild diffuse interlobular septal thickening is identified with heterogeneous ground-glass attenuation noted bilaterally. This appears similar to the previous exam. Subsegmental atelectasis overlying bilateral pleural effusions. Diffuse bronchial wall thickening. Right middle lobe lung nodule is unchanged measuring 4 mm, image 82/4. Multiple small scattered lung nodules appear similar to previous study. These all measure less than 5 mm. Index nodule measures 4 mm, image 82/4. SOFT TISSUES/BONES: No acute or suspicious osseous findings. Degenerative changes noted within the thoracic spine. UPPER ABDOMEN: Calcified splenic granulomas. Cholecystectomy. IVC filter noted. Small hiatal hernia. IMPRESSION: 1. No CT evidence of pneumonia or cavitary lung lesion. 2. Stable right middle lobe lung nodule measuring 4 mm and multiple small scattered lung nodules, all less than 5 mm. 3. Small bilateral pleural effusions and mild diffuse interlobular septal thickening with heterogeneous ground-glass attenuation, similar to the previous exam. Correlate for any clinical signs or symptoms of mild congestive heart failure. 4. Increased caliber of the main pulmonary artery suggests pulmonary artery hypertension. 5. Stable dilatation of the ascending thoracic aorta measuring 4.3 cm.Recommend annual imaging followup by CTA or MRA. This recommendation follows 2010 ACCF/AHA/AATS/ACR/ASA/SCA/SCAI/SIR/STS/SVM Guidelines for the Diagnosis and Management of Patients with Thoracic Aortic Disease. Circulation. 2010; 121: Z733-z630. Aortic aneurysm NOS (ICD10-I71.9) Electronically signed by: Taylor Stroud MD 09/21/2023 07:02 AM EDT RP Workstation: [id number]     Scheduled Meds:  acetaminophen   650 mg Oral QHS    aspirin  EC  81 mg Oral Daily   atorvastatin   40 mg Oral QHS   benzonatate   100 mg Oral TID   diphenhydrAMINE   25 mg Oral QHS   docusate sodium   200 mg Oral Daily   fluticasone  furoate-vilanterol  1 puff Inhalation Daily   furosemide   80 mg Intravenous BID   lactulose   20 g Oral BID   leptospermum manuka honey  1 Application Topical Daily   mesalamine   1,000 mg Oral BID   midodrine   5 mg Oral BID WC   montelukast   10 mg Oral Daily   mupirocin  ointment  1 Application Nasal BID   pantoprazole   40 mg Oral BID   sodium chloride  flush  3 mL Intravenous Q12H   Continuous Infusions:   LOS: 7 days    Time spent:    Sigurd Pac, MD [facility]   09/21/2023, 1:17 PM

## 2023-09-21 NOTE — TOC Progression Note (Addendum)
 Transition of Care [facility]) - Initial/Assessment Note    Patient Details  Name: Felicia Benson MRN: [id number] Date of Birth: 1930-11-07  Transition of [facility]) CM/SW Contact:    Britt JULIANNA Bennetts, LCSW Phone Number: 09/21/2023, 12:10 PM  Clinical Narrative:                 Per MD, patient is not medically ready to d/c today.    TOC will continue to follow.    Expected Discharge Plan: Long Term Nursing Home Barriers to Discharge: Continued Medical Work up   Patient Goals and CMS Choice Patient states their goals for this hospitalization and ongoing recovery are:: To return to LTC          Expected Discharge Plan and Services In-house Referral: Clinical Social Work   Post Acute Care Choice: Skilled Nursing Facility Living arrangements for the past 2 months: Skilled Nursing Facility                                      Prior Living Arrangements/Services Living arrangements for the past 2 months: Skilled Nursing Facility Lives with:: Facility Resident Patient language and need for interpreter reviewed:: Yes Do you feel safe going back to the place where you live?: Yes      Need for Family Participation in Patient Care: No (Comment) (From a LTC) Care giver support system in place?: Yes (comment)   Criminal Activity/Legal Involvement Pertinent to Current Situation/Hospitalization: No - Comment as needed  Activities of Daily Living   ADL Screening (condition at time of admission) Independently performs ADLs?: No Does the patient have a NEW difficulty with bathing/dressing/toileting/self-feeding that is expected to last >3 days?: No Does the patient have a NEW difficulty with getting in/out of bed, walking, or climbing stairs that is expected to last >3 days?: No Does the patient have a NEW difficulty with communication that is expected to last >3 days?: No Is the patient deaf or have difficulty hearing?: No Does the patient have difficulty seeing, even when  wearing glasses/contacts?: No Does the patient have difficulty concentrating, remembering, or making decisions?: No  Permission Sought/Granted Permission sought to share information with : Facility Medical sales representative, Family Supports Permission granted to share information with : No Environmental manager info on chart)  Share Information with NAME: [name]  Permission granted to share info w AGENCY: [facility]  Permission granted to share info w Relationship: Daughter  Permission granted to share info w Contact Information: [phone]  Emotional Assessment Appearance:: Appears stated age Attitude/Demeanor/Rapport: Engaged Affect (typically observed): Pleasant Orientation: : Oriented to Place, Oriented to Self, Oriented to  Time, Oriented to Situation Alcohol  / Substance Use: Not Applicable Psych Involvement: No (comment)  Admission diagnosis:  Acute exacerbation of CHF (congestive heart failure) (HCC) [I50.9] Acute on chronic systolic congestive heart failure (HCC) [I50.23] Patient Active Problem List   Diagnosis Date Noted   Pressure injury of skin 09/15/2023   History of pulmonary embolism 08/18/2023   Obesity, class 1 08/18/2023   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, 41-49%) (HCC) 08/17/2023   Hypoxic episode 07/23/2023   Acute on chronic respiratory failure with hypoxemia (HCC) 06/25/2023   Myocardial injury 04/08/2023   Sacral ulcer (HCC) 04/08/2023   Hypokalemia 04/08/2023   Goals of care, counseling/discussion 04/08/2023   Acute renal failure superimposed on stage 3b chronic kidney disease (HCC) 03/13/2023  Chronic respiratory failure with hypoxia (HCC) 03/08/2023   Elevated troponin 03/08/2023   Acute exacerbation of CHF (congestive heart failure) (HCC) 03/07/2023   Right ventricular failure (HCC) 02/15/2023   Shortness of breath 02/13/2023   DVT (deep venous thrombosis) (HCC) 02/11/2023   Acute pulmonary embolism (HCC)  02/08/2023   Acute respiratory failure with hypoxia (HCC) 02/08/2023   CAP (community acquired pneumonia) 02/07/2023   Filling defect on imaging study 02/07/2023   CKD (chronic kidney disease) 02/07/2023   Diverticular hemorrhage 12/28/2022   Ileus, postoperative (HCC) 11/04/2022   Closed right hip fracture (HCC) 10/28/2022   Fall at home, initial encounter 10/28/2022   History of COPD 10/28/2022   Allergic rhinitis 10/28/2022   Anemia of chronic disease 10/28/2022   Chronic rhinitis 08/06/2022   OSA (obstructive sleep apnea) 08/06/2022   Heart failure, acute diastolic (HCC) 07/21/2022   Chronic kidney disease, stage 3b (HCC) 07/21/2022   Thoracic aortic aneurysm (HCC) 07/21/2022   GERD (gastroesophageal reflux disease) 07/21/2022   Pancreatic lesion 07/21/2022   Macular degeneration 07/21/2022   Hypocalcemia 07/21/2022   Insomnia 07/21/2022   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   TIA (transient ischemic attack) 12/31/2020   Acute on chronic diastolic heart failure (HCC) 05/19/2019   Acute CHF (congestive heart failure) (HCC) 05/18/2019   Chronic hypoxic respiratory failure (HCC) 05/18/2019   COPD (chronic obstructive pulmonary disease) (HCC) 05/18/2019   History of cardiac pacemaker 05/18/2019   AAA (abdominal aortic aneurysm) 05/18/2019   Hyperlipidemia 05/18/2019   Abdominal pain in female 05/30/2015   Essential hypertension 05/30/2015   Crohn's disease (HCC) 05/30/2015   Gastrointestinal hemorrhage with melena 05/30/2015   Blood loss anemia 05/30/2015   Hyponatremia 05/30/2015   Kidney disease 05/30/2015   Normocytic anemia 05/30/2015   CAD S/P percutaneous coronary angioplasty 05/30/2015   Acute blood loss anemia 05/30/2015   Heme + stool    PCP:  [facility]:   [facility] [address] [place] - [address] [place] [address] Phone: [phone] Fax: [phone]  [facility] [place], [place] -  [address] [place] [address] Phone: [phone] Fax: [phone]     Social Drivers of Health (SDOH) Social History: SDOH Screenings   Food Insecurity: No Food Insecurity (09/15/2023)  Housing: Low Risk  (09/15/2023)  Transportation Needs: No Transportation Needs (09/15/2023)  Utilities: Not At Risk (09/15/2023)  Social Connections: Moderately Integrated (09/15/2023)  Recent Concern: Social Connections - Moderately Isolated (07/24/2023)  Tobacco Use: Medium Risk (09/14/2023)   SDOH Interventions:     Readmission Risk Interventions    07/26/2023   11:43 AM 07/24/2022    2:49 PM  Readmission Risk Prevention Plan  Transportation Screening Complete Complete  PCP or Specialist Appt within 5-7 Days  Complete  Home Care Screening  Complete  Medication Review (RN CM)  Complete  Medication Review (RN Care Manager) Complete   PCP or Specialist appointment within 3-5 days of discharge Complete   HRI or Home Care Consult Complete   Palliative Care Screening Not Applicable   Skilled Nursing Facility Not Applicable

## 2023-09-22 DIAGNOSIS — I5033 Acute on chronic diastolic (congestive) heart failure: Secondary | ICD-10-CM | POA: Diagnosis not present

## 2023-09-22 LAB — BASIC METABOLIC PANEL WITH GFR
Anion gap: 12 (ref 5–15)
BUN: 72 mg/dL — ABNORMAL HIGH (ref 8–23)
CO2: 37 mmol/L — ABNORMAL HIGH (ref 22–32)
Calcium: 8.2 mg/dL — ABNORMAL LOW (ref 8.9–10.3)
Chloride: 85 mmol/L — ABNORMAL LOW (ref 98–111)
Creatinine, Ser: 1.95 mg/dL — ABNORMAL HIGH (ref 0.44–1.00)
GFR, Estimated: 24 mL/min — ABNORMAL LOW (ref >60)
Glucose, Bld: 125 mg/dL — ABNORMAL HIGH (ref 70–99)
Potassium: 3.2 mmol/L — ABNORMAL LOW (ref 3.5–5.1)
Sodium: 134 mmol/L — ABNORMAL LOW (ref 135–145)

## 2023-09-22 MED ORDER — TORSEMIDE 20 MG PO TABS
40.0000 mg | ORAL_TABLET | Freq: Two times a day (BID) | ORAL | Status: DC
  Administered 2023-09-22: 40 mg via ORAL
  Filled 2023-09-22: qty 2

## 2023-09-22 MED ORDER — POTASSIUM CHLORIDE CRYS ER 20 MEQ PO TBCR
40.0000 meq | EXTENDED_RELEASE_TABLET | Freq: Two times a day (BID) | ORAL | Status: AC
  Administered 2023-09-22 (×2): 40 meq via ORAL
  Filled 2023-09-22 (×2): qty 2

## 2023-09-22 MED ORDER — CETIRIZINE HCL 10 MG PO TABS: 10.0000 mg | ORAL_TABLET | Freq: Every morning | ORAL | Status: DC

## 2023-09-22 MED ORDER — POTASSIUM CHLORIDE CRYS ER 20 MEQ PO TBCR: 20.0000 meq | EXTENDED_RELEASE_TABLET | Freq: Two times a day (BID) | ORAL | Status: AC

## 2023-09-22 MED ORDER — METOLAZONE 5 MG PO TABS: ORAL_TABLET | ORAL | Status: AC

## 2023-09-22 MED ORDER — TORSEMIDE 40 MG PO TABS: 40.0000 mg | ORAL_TABLET | Freq: Two times a day (BID) | ORAL | Status: AC

## 2023-09-22 NOTE — Discharge Summary (Signed)
 Physician Discharge Summary  Felicia Benson FMW:[id number] DOB: [birth year] DOA: 09/14/2023  PCP: Leonidas  Admit date: 09/14/2023 Discharge date: 09/22/2023  Time spent: 45 minutes  Recommendations for Outpatient Follow-up:  Back to SNF for long-term care Outpatient palliative care with ongoing discussions regarding hospice BMP in 1 week   Discharge Diagnoses:  Principal Problem:   Acute on chronic diastolic heart failure (HCC) Severe RV failure Chronic hypoxic respiratory failure on 5 to 6 L O2 at baseline Severe debility   Essential hypertension   CAD S/P percutaneous coronary angioplasty   Chronic kidney disease, stage 3b (HCC)   COPD (chronic obstructive pulmonary disease) (HCC)   Hyperlipidemia   GERD (gastroesophageal reflux disease)   Crohn's disease (HCC)   OSA (obstructive sleep apnea)   Anemia of chronic disease   History of pulmonary embolism   Macular degeneration   Obesity, class 1   Pressure injury of skin   Discharge Condition: Improving  Diet recommendation: Low sodium, heart healthy  Filed Weights   09/20/23 0353 09/21/23 0425 09/22/23 0427  Weight: 90.3 kg 87.5 kg 84.8 kg    History of present illness:  89 yo female, nursing home resident, with the past medical history of Crohn's disease, COPD, CKD stage 3B, history of DVT and PE sp IVC filter, who presented with dyspnea, lower extremity edema and weight gain.  - Frequent readmissions, seen by palliative care, continues to decline hospice  Hospital Course:   Acute on chronic diastolic heart failure  Severe RV Failure, Pulm HTN Moderate to severe mitral regurgitation -Echo 7/25 w/ 45 to 50%, global hypokinesis, severe LVH of septal segment,  reduction RV systolic function, moderate to severe mitral valve regurgitation, moderate tricuspid valve regurgitation,  -Per cards review of echo, RV is severely depressed - Limited response initially, finally diuresing better with Lasix  80 mg twice daily and  metolazone , weight down 17 LB to 187 at discharge today -Transition to torsemide  40 mg twice daily with metolazone  twice a week -She also has hypoalbuminemia contributing to third spacing -Also has very soft/low blood pressures limiting GDMT, remains on midodrine  - This is her fourth hospitalization in 3 months, has been seen by palliative care numerous times in the recent past, discussed hospice with patient again, discussed with daughter and palliative care team again, patient refuses hospice, family understands and plans to work with outpatient palliative care to eventually transition to Hospice at SNF   Acute on chronic hypoxic resp failure Advanced COPD, on 5L O2 at baseline - Stable, wean O2 down to 5 L, continue inhalers   Essential hypertension Continue midodrine     CAD S/P percutaneous coronary angioplasty No chest pain, no acute coronary syndrome,  Elevation of high sensitive troponin due to heart failure exacerbation  Continue aspirin  and statin    Chronic kidney disease, stage 3b (HCC) Hypokalemia, hyponatremia, AKI  stable   Hyperlipidemia Continue with statin therapy    GERD (gastroesophageal reflux disease) Continue proton pump inhibitor   Crohn's disease (HCC) Continue mesalamine , no signs of acute flare.    OSA (obstructive sleep apnea) Cpap    Anemia of chronic disease Cell count has been stable    History of pulmonary embolism Sp IV filter, not on oral anticoagulation    Macular degeneration Outpatient follow up    Obesity, class 1 Calculated BMI is 32.8   Discharge Exam: Vitals:   09/22/23 0757 09/22/23 0859  BP:  112/70  Pulse:  87  Resp: 18   Temp:  SpO2:  93%   General exam: Appears calm and comfortable  HEENT: no JVD Respiratory system: Clear to auscultation Cardiovascular system: S1 & S2 heard, RRR.  Abd: nondistended, soft and nontender.Normal bowel sounds heard. Central nervous system: Alert and oriented. No focal neurological  deficits. Extremities: 1plus edema, chronic skin changes Skin: No rashes Psychiatry:  Mood & affect appropriate.   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 Give with melatonin   albuterol  108 (90 Base) MCG/ACT inhaler Commonly known as: VENTOLIN  HFA Inhale 2 puffs into the lungs every 6 (six) hours as needed for wheezing or shortness of breath.   aspirin  EC 81 MG tablet Take 1 tablet (81 mg total) by mouth daily. Swallow whole.   atorvastatin  40 MG tablet Commonly known as: LIPITOR Take 40 mg by mouth at bedtime.   barrier cream Crea Commonly known as: non-specified Apply 1 Application topically 2 (two) times daily.   Breo Ellipta  200-25 MCG/ACT Aepb Generic drug: fluticasone  furoate-vilanterol Inhale 1 puff into the lungs daily.   cholecalciferol  25 MCG (1000 UNIT) tablet Commonly known as: VITAMIN D3 Take 1,000 Units by mouth in the morning.   diphenhydramine -acetaminophen  25-500 MG Tabs tablet Commonly known as: TYLENOL  PM Take 1 tablet by mouth at bedtime as needed (Insomnia).   docusate sodium  100 MG capsule Commonly known as: COLACE Take 200 mg by mouth in the morning.   esomeprazole 20 MG capsule Commonly known as:  NEXIUM Take 20 mg by mouth daily at 12 noon.   lidocaine  4 % Place 1 patch onto the skin 2 (two) times daily.   liver oil-zinc  oxide 40 % ointment Commonly known as: DESITIN Apply topically 2 (two) times daily.   loratadine  10 MG tablet Commonly known as: CLARITIN  Take 10 mg by mouth daily.   melatonin 3 MG Tabs tablet Take 9 mg by mouth at bedtime.   metolazone  5 MG tablet Commonly known as: ZAROXOLYN  Take 1 tablet (5 mg) twice a week, on Mondays and Thursdays along with extra 40 mEq of potassium   midodrine  5 MG tablet Commonly known as: PROAMATINE  Take 1 tablet (5 mg total) by mouth 2 (two) times daily with a meal.   montelukast  10 MG tablet Commonly known as: SINGULAIR  Take 1 tablet (10 mg total) by mouth daily.   ondansetron  4 MG tablet Commonly known as: ZOFRAN  Take 4 mg by mouth every 6 (six) hours as needed for nausea or vomiting.   OXYGEN Inhale 4 L into the lungs continuous. What changed: how much to take   Pentasa  500 MG CR capsule Generic drug: mesalamine  Take 1,000 mg by mouth 2 (two) times daily.   polyethylene glycol 17 g  packet Commonly known as: MIRALAX  / GLYCOLAX  Take 17 g by mouth 2 (two) times daily as needed for mild constipation or moderate constipation.   potassium chloride  SA 20 MEQ tablet Commonly known as: KLOR-CON  M Take 1 tablet (20 mEq total) by mouth 2 (two) times daily. And take extra 40meq on Mondays and Thursdays with Metolazone    PRO-STAT PO Take 30 mLs by mouth daily.   saccharomyces boulardii 250 MG capsule Commonly known as: FLORASTOR Take 250 mg by mouth 2 (two) times daily.   Torsemide  40 MG Tabs Take 40 mg by mouth 2 (two) times daily. Take 40mg  in am and 20mg  in afternoon, take extra dose of 20mg  for weight gain of 3lb in 1day or 5lb in 1 week What changed:  medication strength how much to take how to take this when to take this       Allergies  Allergen Reactions   Motrin [Ibuprofen] Other (See Comments)     GI bleed   Ace Inhibitors Cough   Breztri  Aerosphere [Budeson-Glycopyrrol-Formoterol ] Hypertension   Cipro [Ciprofloxacin Hcl] Other (See Comments)    Avoid fluoroquinolones due to asc-aortic aneurysm   Levaquin [Levofloxacin] Other (See Comments)    Avoid fluoroquinolones due to asc-aortic aneurysm   Nsaids Nausea And Vomiting and Other (See Comments)    Hx of GI bleed    Contact information for after-discharge care     Destination     [facility] .   Service: Skilled Nursing Contact information: [address] [place]  [phone]                      The results of significant diagnostics from this hospitalization (including imaging, microbiology, ancillary and laboratory) are listed below for reference.    Significant Diagnostic Studies: CT CHEST WO CONTRAST Result Date: 09/21/2023 EXAM: CT CHEST WITHOUT CONTRAST 09/21/2023 06:47:53 AM TECHNIQUE: CT of the chest was performed without the administration of intravenous contrast. Multiplanar reformatted images are provided for review. Automated exposure control, iterative reconstruction, and/or weight based adjustment of the mA/kV was utilized to reduce the radiation dose to as low as reasonably achievable. COMPARISON: 02/07/2023 CLINICAL HISTORY: Rule out pneumonia/cavitary lung lesion. Patient has hemoptysis 1 episode. FINDINGS: MEDIASTINUM: Heart size is upper limits of normal. Left chest wall pacer with leads in the right atrium and right ventricle. No pericardial effusion. Increased caliber of the main pulmonary artery suggests pulmonary artery hypertension. Stable dilatation of the ascending thoracic aorta measuring 4.3 cm. Multinodular thyroid gland is again noted. This appears unchanged from the previous study. In a patient with limited life expectancy or multiple comorbidities, no follow-up imaging is recommended. Surgical clips noted within the region of the left lobe of thyroid gland.  LYMPH NODES: No pathologically enlarged mediastinal lymph nodes. Calcified subcarinal and right hilar nodes compatible with remote granulomatous disease. LUNGS AND PLEURA: Small bilateral pleural effusions. Mild diffuse interlobular septal thickening is identified with heterogeneous ground-glass attenuation noted bilaterally. This appears similar to the previous exam. Subsegmental atelectasis overlying bilateral pleural effusions. Diffuse bronchial wall thickening. Right middle lobe lung nodule is unchanged measuring 4 mm, image 82/4. Multiple small scattered lung nodules appear similar to previous study. These all measure less than 5 mm. Index nodule measures 4 mm, image 82/4. SOFT TISSUES/BONES: No acute or suspicious osseous findings. Degenerative changes noted within the thoracic spine. UPPER ABDOMEN: Calcified splenic granulomas. Cholecystectomy. IVC filter noted. Small hiatal hernia. IMPRESSION: 1. No CT evidence of pneumonia or cavitary  lung lesion. 2. Stable right middle lobe lung nodule measuring 4 mm and multiple small scattered lung nodules, all less than 5 mm. 3. Small bilateral pleural effusions and mild diffuse interlobular septal thickening with heterogeneous ground-glass attenuation, similar to the previous exam. Correlate for any clinical signs or symptoms of mild congestive heart failure. 4. Increased caliber of the main pulmonary artery suggests pulmonary artery hypertension. 5. Stable dilatation of the ascending thoracic aorta measuring 4.3 cm.Recommend annual imaging followup by CTA or MRA. This recommendation follows 2010 ACCF/AHA/AATS/ACR/ASA/SCA/SCAI/SIR/STS/SVM Guidelines for the Diagnosis and Management of Patients with Thoracic Aortic Disease. Circulation. 2010; 121: Z733-z630. Aortic aneurysm NOS (ICD10-I71.9) Electronically signed by: Waddell Calk MD 09/21/2023 07:02 AM EDT RP Workstation: [id number]   DG Chest Portable 1 View Result Date: 09/14/2023 CLINICAL DATA:  Shortness of  breath, CHF EXAM: PORTABLE CHEST 1 VIEW COMPARISON:  08/17/2023 FINDINGS: Single frontal view of the chest demonstrates an enlarged cardiac silhouette, stable. Dual lead pacemaker is unchanged. There is increased central pulmonary vascular congestion, basilar predominant interstitial and ground-glass opacities compatible with edema. Small bilateral pleural effusions, left greater than right. No pneumothorax. IMPRESSION: 1. Moderate congestive heart failure, with worsening volume status since prior exam. Electronically Signed   By: Ozell Daring M.D.   On: 09/14/2023 20:15   CUP PACEART REMOTE DEVICE CHECK Result Date: 09/11/2023 PPM Scheduled remote reviewed. Normal device function.  Presenting rhythm: AS/VP 2 NSVT events, V-rates 176-200 bpm, longest x 31 beats on 6/13 at 0136.  V>A.  To triage Next remote 91 days. [place], CV[facility]   Microbiology: Recent Results (from the past 240 hours)  MRSA Next Gen by PCR, Nasal     Status: Abnormal   Collection Time: 09/16/23  1:31 PM   Specimen: Nasal Mucosa; Nasal Swab  Result Value Ref Range Status   MRSA by PCR Next Gen DETECTED (A) NOT DETECTED Final    Comment: RESULT CALLED TO, READ BACK BY AND VERIFIED WITH: RN ANTONETTA PARAS [id number] FCP (NOTE) The GeneXpert MRSA Assay (FDA approved for NASAL specimens only), is one component of a comprehensive MRSA colonization surveillance program. It is not intended to diagnose MRSA infection nor to guide or monitor treatment for MRSA infections. Test performance is not FDA approved in patients less than 29 years old. Performed at [facility], [address], [place], [place] [address]      Labs: Basic Metabolic Panel: Recent Labs  Lab 09/16/23 0911 09/17/23 0324 09/17/23 1316 09/18/23 0231 09/19/23 0231 09/20/23 0245 09/21/23 0235 09/22/23 0236  NA 133* 131*   < > 133* 134* 134* 134* 134*  K 3.3* 3.7   < > 3.7 3.6 4.0 3.8 3.2*  CL 88* 88*   < > 88* 87* 88* 88* 85*  CO2 28 34*   < > 36* 34* 39*  34* 37*  GLUCOSE 130* 86   < > 103* 94 90 164* 125*  BUN 72* 75*   < > 72* 66* 70* 75* 72*  CREATININE 2.18* 2.09*   < > 1.97* 1.73* 1.87* 2.01* 1.95*  CALCIUM  8.3* 8.1*   < > 8.1* 8.2* 8.0* 8.0* 8.2*  MG 1.7 2.1  --  2.3  --   --   --   --    < > = values in this interval not displayed.   Liver Function Tests: No results for input(s): AST, ALT, ALKPHOS, BILITOT, PROT, ALBUMIN  in the last 168 hours. No results for input(s): LIPASE, AMYLASE in the last 168  hours. No results for input(s): AMMONIA in the last 168 hours. CBC: Recent Labs  Lab 09/21/23 0235  WBC 6.4  HGB 8.9*  HCT 29.4*  MCV 91.0  PLT 273   Cardiac Enzymes: No results for input(s): CKTOTAL, CKMB, CKMBINDEX, TROPONINI in the last 168 hours. BNP: BNP (last 3 results) Recent Labs    07/23/23 1812 08/17/23 1953 09/14/23 2125  BNP 1,806.7* 2,202.6* 2,920.5*    ProBNP (last 3 results) No results for input(s): PROBNP in the last 8760 hours.  CBG: No results for input(s): GLUCAP in the last 168 hours.     Signed:  Sigurd Pac MD.  [facility] 09/22/2023, 10:13 AM

## 2023-09-22 NOTE — Progress Notes (Addendum)
 Called report to charveta, LPN at [facility]. D/c AVS placed in pt's d/c packet.   Amado GORMAN [place]

## 2023-09-22 NOTE — Plan of Care (Signed)
  Problem: Education: Goal: Knowledge of General Education information will improve Description: Including pain rating scale, medication(s)/side effects and non-pharmacologic comfort measures Outcome: Progressing   Problem: Health Behavior/Discharge Planning: Goal: Ability to manage health-related needs will improve Outcome: Progressing   Problem: Clinical Measurements: Goal: Ability to maintain clinical measurements within normal limits will improve Outcome: Progressing Goal: Will remain free from infection Outcome: Progressing   Problem: Safety: Goal: Ability to remain free from injury will improve Outcome: Progressing   Problem: Skin Integrity: Goal: Risk for impaired skin integrity will decrease Outcome: Progressing

## 2023-09-22 NOTE — TOC Transition Note (Signed)
 Transition of Care [facility]) - Discharge Note   Patient Details  Name: Felicia Benson MRN: [id number] Date of Birth: [birth year]  [facility] CM/SW Contact:  Britt JULIANNA Bennetts, LCSW Phone Number: 09/22/2023, 1:11 PM   Clinical Narrative:    Patient will DC to: [facility] (SNF) Anticipated DC date: 09/22/2023 Family notified: Daughter Transport by: DONALDA   Per MD patient ready for DC to SNF. RN to call report prior to discharge [phone]). RN, patient's family, and facility notified of DC. Discharge Summary sent to facility.  Ambulance transport will be requested for patient.   CSW will sign off for now as social work intervention is no longer needed. Please consult us  again if new needs arise.    Final next level of care: Skilled Nursing Facility Barriers to Discharge: Barriers Resolved   Patient Goals and CMS Choice Patient states their goals for this hospitalization and ongoing recovery are:: To return to LTC          Discharge Placement              Patient chooses bed at:  [facility]) Patient to be transferred to facility by: [facility] Name of family member notified: Meliton Morrison (Daughter)  [phone] Patient and family notified of of transfer: 09/22/23  Discharge Plan and Services Additional resources added to the After Visit Summary for   In-house Referral: Clinical Social Work   Post Acute Care Choice: Skilled Nursing Facility                               Social Drivers of Health (SDOH) Interventions SDOH Screenings   Food Insecurity: No Food Insecurity (09/15/2023)  Housing: Low Risk  (09/15/2023)  Transportation Needs: No Transportation Needs (09/15/2023)  Utilities: Not At Risk (09/15/2023)  Social Connections: Moderately Integrated (09/15/2023)  Recent Concern: Social Connections - Moderately Isolated (07/24/2023)  Tobacco Use: Medium Risk (09/14/2023)     Readmission Risk Interventions    07/26/2023   11:43 AM 07/24/2022    2:49 PM   Readmission Risk Prevention Plan  Transportation Screening Complete Complete  PCP or Specialist Appt within 5-7 Days  Complete  Home Care Screening  Complete  Medication Review (RN CM)  Complete  Medication Review (RN Care Manager) Complete   PCP or Specialist appointment within 3-5 days of discharge Complete   HRI or Home Care Consult Complete   Palliative Care Screening Not Applicable   Skilled Nursing Facility Not Applicable

## 2023-09-22 NOTE — TOC Progression Note (Addendum)
 Transition of Care [facility]) - Initial/Assessment Note    Patient Details  Name: Felicia Benson MRN: [id number] Date of Birth: 11/21/1930  Transition of Care [facility]) CM/SW Contact:    Britt JULIANNA Bennetts, LCSW Phone Number: 09/22/2023, 9:35 AM  Clinical Narrative:                 Per MD, pt is medically ready to discharge.  CSW contacted [facility] in admissions at [facility] ([facility]).  The facility can accept the patient back today.  Pending d/c summary.  11:35- CSW faxed discharge summary to SNF and contacted Logan to verify receipt and request the number to call report.  There was no answer.  CSW left a secure message requesting a returned call.  TOC will continue to follow.   Expected Discharge Plan: Long Term Nursing Home Barriers to Discharge: Continued Medical Work up   Patient Goals and CMS Choice Patient states their goals for this hospitalization and ongoing recovery are:: To return to LTC          Expected Discharge Plan and Services In-house Referral: Clinical Social Work   Post Acute Care Choice: Skilled Nursing Facility Living arrangements for the past 2 months: Skilled Nursing Facility                                      Prior Living Arrangements/Services Living arrangements for the past 2 months: Skilled Nursing Facility Lives with:: Facility Resident Patient language and need for interpreter reviewed:: Yes Do you feel safe going back to the place where you live?: Yes      Need for Family Participation in Patient Care: No (Comment) (From a LTC) Care giver support system in place?: Yes (comment)   Criminal Activity/Legal Involvement Pertinent to Current Situation/Hospitalization: No - Comment as needed  Activities of Daily Living   ADL Screening (condition at time of admission) Independently performs ADLs?: No Does the patient have a NEW difficulty with bathing/dressing/toileting/self-feeding that is expected to last >3 days?: No Does the patient have a  NEW difficulty with getting in/out of bed, walking, or climbing stairs that is expected to last >3 days?: No Does the patient have a NEW difficulty with communication that is expected to last >3 days?: No Is the patient deaf or have difficulty hearing?: No Does the patient have difficulty seeing, even when wearing glasses/contacts?: No Does the patient have difficulty concentrating, remembering, or making decisions?: No  Permission Sought/Granted Permission sought to share information with : Facility Medical sales representative, Family Supports Permission granted to share information with : No Environmental manager info on chart)  Share Information with NAME: [name]  Permission granted to share info w AGENCY: [facility]  Permission granted to share info w Relationship: Daughter  Permission granted to share info w Contact Information: [phone]  Emotional Assessment Appearance:: Appears stated age Attitude/Demeanor/Rapport: Engaged Affect (typically observed): Pleasant Orientation: : Oriented to Place, Oriented to Self, Oriented to  Time, Oriented to Situation Alcohol  / Substance Use: Not Applicable Psych Involvement: No (comment)  Admission diagnosis:  Acute exacerbation of CHF (congestive heart failure) (HCC) [I50.9] Acute on chronic systolic congestive heart failure (HCC) [I50.23] Patient Active Problem List   Diagnosis Date Noted   Pressure injury of skin 09/15/2023   History of pulmonary embolism 08/18/2023   Obesity, class 1 08/18/2023   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, 41-49%) (HCC) 08/17/2023  Hypoxic episode 07/23/2023   Acute on chronic respiratory failure with hypoxemia (HCC) 06/25/2023   Myocardial injury 04/08/2023   Sacral ulcer (HCC) 04/08/2023   Hypokalemia 04/08/2023   Goals of care, counseling/discussion 04/08/2023   Acute renal failure superimposed on stage 3b chronic kidney disease (HCC) 03/13/2023   Chronic  respiratory failure with hypoxia (HCC) 03/08/2023   Elevated troponin 03/08/2023   Acute exacerbation of CHF (congestive heart failure) (HCC) 03/07/2023   Right ventricular failure (HCC) 02/15/2023   Shortness of breath 02/13/2023   DVT (deep venous thrombosis) (HCC) 02/11/2023   Acute pulmonary embolism (HCC) 02/08/2023   Acute respiratory failure with hypoxia (HCC) 02/08/2023   CAP (community acquired pneumonia) 02/07/2023   Filling defect on imaging study 02/07/2023   CKD (chronic kidney disease) 02/07/2023   Diverticular hemorrhage 12/28/2022   Ileus, postoperative (HCC) 11/04/2022   Closed right hip fracture (HCC) 10/28/2022   Fall at home, initial encounter 10/28/2022   History of COPD 10/28/2022   Allergic rhinitis 10/28/2022   Anemia of chronic disease 10/28/2022   Chronic rhinitis 08/06/2022   OSA (obstructive sleep apnea) 08/06/2022   Heart failure, acute diastolic (HCC) 07/21/2022   Chronic kidney disease, stage 3b (HCC) 07/21/2022   Thoracic aortic aneurysm (HCC) 07/21/2022   GERD (gastroesophageal reflux disease) 07/21/2022   Pancreatic lesion 07/21/2022   Macular degeneration 07/21/2022   Hypocalcemia 07/21/2022   Insomnia 07/21/2022   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   TIA (transient ischemic attack) 12/31/2020   Acute on chronic diastolic heart failure (HCC) 05/19/2019   Acute CHF (congestive heart failure) (HCC) 05/18/2019   Chronic hypoxic respiratory failure (HCC) 05/18/2019   COPD (chronic obstructive pulmonary disease) (HCC) 05/18/2019   History of cardiac pacemaker 05/18/2019   AAA (abdominal aortic aneurysm) 05/18/2019   Hyperlipidemia 05/18/2019   Abdominal pain in female 05/30/2015   Essential hypertension 05/30/2015   Crohn's disease (HCC) 05/30/2015   Gastrointestinal hemorrhage with melena 05/30/2015   Blood loss anemia 05/30/2015   Hyponatremia 05/30/2015   Kidney disease 05/30/2015   Normocytic anemia 05/30/2015   CAD S/P  percutaneous coronary angioplasty 05/30/2015   Acute blood loss anemia 05/30/2015   Heme + stool    PCP:  [facility]:   [facility] [address], [place] - [address] [place] [address] Phone: [phone] Fax: [phone]  [facility] - [place], [place] - [address] [place] [address] Phone: [phone] Fax: [phone]     Social Drivers of Health (SDOH) Social History: SDOH Screenings   Food Insecurity: No Food Insecurity (09/15/2023)  Housing: Low Risk  (09/15/2023)  Transportation Needs: No Transportation Needs (09/15/2023)  Utilities: Not At Risk (09/15/2023)  Social Connections: Moderately Integrated (09/15/2023)  Recent Concern: Social Connections - Moderately Isolated (07/24/2023)  Tobacco Use: Medium Risk (09/14/2023)   SDOH Interventions:     Readmission Risk Interventions    07/26/2023   11:43 AM 07/24/2022    2:49 PM  Readmission Risk Prevention Plan  Transportation Screening Complete Complete  PCP or Specialist Appt within 5-7 Days  Complete  Home Care Screening  Complete  Medication Review (RN CM)  Complete  Medication Review (RN Care Manager) Complete   PCP or Specialist appointment within 3-5 days of discharge Complete   HRI or Home Care Consult Complete   Palliative Care Screening Not Applicable   Skilled Nursing Facility Not Applicable

## 2023-09-30 ENCOUNTER — Ambulatory Visit (HOSPITAL_BASED_OUTPATIENT_CLINIC_OR_DEPARTMENT_OTHER): Admitting: Nurse Practitioner

## 2023-12-04 ENCOUNTER — Ambulatory Visit (HOSPITAL_BASED_OUTPATIENT_CLINIC_OR_DEPARTMENT_OTHER): Admitting: Cardiovascular Disease

## 2023-12-09 ENCOUNTER — Encounter

## 2023-12-23 NOTE — Progress Notes (Deleted)
 Cardiology Office Note   Date:  12/23/2023  ID:  Felicia Benson, DOB [birth year], MRN [id number] PCP: [facility] Providers Cardiologist:  Annabella Scarce, MD Electrophysiologist:  Soyla Gladis Norton, MD     [facility] Chronic diastolic heart failure COPD Complete heart block s/p PPM implant 07/2022 Pulmonary hypertension Coronary artery disease S/p PCI of RCA and repeat PCI 2017 for ISR Acute PE/DEVT s/p IVC filter 01/2023 AAA Hypertension CKD Crohn's disease with recurrent diverticular bleed 2020 Chronic respiratory failure Former tobacco use > 30 years  Previously followed by cardiology in [place].  Admission 02/2023 at [facility].  Cardiology consulted for management of heart failure.  Reportedly had abnormal Myoview with defects in the apical/inferior lateral walls but subsequent cath January 2022 did not show significant CAD.  She was titrated on antianginals for microvascular disease.  Hospitalization December 2024 with lower GI bleed secondary to Crohn's disease requiring blood transfusions.  Admission January 2025 for multifocal pneumonia, subsegmental PE/DVT, not placed on DOAC given recent GI bleed.  She had IVC filter placed.  Seen February 2025 for CHF exacerbation after suffering a fall.  Echo with preserved EF, D shaped septum, severely reduced RV with RVSP 59.6 mmHg.  Was reportedly discharged on 60 mg of Lasix  but only received 20 mg at her facility.  Repeat admission 03/05/2023 noted to be hypoxic with O2 sats in the 80s at her facility.  She was hypotensive and was given midodrine  10 mg 3 times daily.  She reported chest tightness that was relieved by albuterol  inhaler.  She reported being able to ambulate minimally but somewhat still self-sufficient with a daughter who helps care for her at the facility.  Noted to have chronically low albumin .  Troponins were flat at 48 >> 45.  She was discharged on torsemide  20 mg daily and midodrine .  Palliative care was  consulted.  Office visit 04/23/2023 with Dr. Norton. Was feeling well from a volume perspective with no edema, orthopnea, PND, or shortness of breath.  Weight was down. No changes were made to PPM and EP f/u recommended in 1 year. Advised to follow-up with general cardiology.  Echo 07/24/2023 revealed LVEF 45 to 50%, global hypokinesis of LV, severe asymmetric LVH of the septal segment, flattened interventricular septum, moderately enlarged RV, moderate to severe mitral valve regurgitation.  Seen in clinic by me on 08/08/2023 and accompanied by her daughter. Was traveling by wheelchair from assisted living facility. Concerned about fluid retention with weight fluctuations since her recent hospital discharge. Her weight typically ranges between 181 and 191 pounnds.  Was concerned she was not being weighed appropriately. On torsemide  twice daily 40 mg in the a.m. and 20 mg in the p.m.with significant urine output for approximately an hour and a half post-administration. No chest pain, orthopnea, PND. Fluid intake is 1500 to 1800 cc daily, mainly from tea and fruit punches, avoiding carbonated beverages due to acid reflux. Not walking much in the past two days and experiencing some shortness of breath but does not feel it is significant; participating in PT. Using CPAP machine at night with oxygen at five liters, maintaining O2 sat of 93 to 95 percent. HOB elevated to aid breathing and prevent aspiration due to a persistent cough. BP typically lower in the morning, with a recent reading of 106/70 mmHg, taking midodrine  as needed for low BP. No lightheadedness or dizziness when ambulating, no presyncope or syncope.  Not felt to be a candidate for advanced therapies with GDMT limited by  CKD and hypotension.  Advised to limit fluids to 2L per day, mostly water.  Admission 9/6-9/14/25 with acute hypoxic respiratory failure.   This was her fourth hospitalization in 3 months and had been seen by palliative care  numerous times but continued to decline hospice.  Seen in consult by cardiology with severely depressed RV, at least moderate to severe TR. Hypoalbuminemia contributing to edema. On midodrine  for hypotension.   History of Present Illness Discussed the use of AI scribe software for clinical note transcription with the patient, who gave verbal consent to proceed.  History of Present Illness Felicia Benson is a very pleasant 89 year old female who presents today for follow-up of heart failure. She is accompanied by her daughter and is   ROS: See HPI  Studies Reviewed       No results found for: LIPOA  Risk Assessment/Calculations   No BP recorded.  {Refresh Note OR Click here to enter BP  :1}***       Physical Exam VS:  There were no vitals taken for this visit.   Wt Readings from Last 3 Encounters:  09/22/23 187 lb (84.8 kg)  08/26/23 187 lb 9.8 oz (85.1 kg)  08/08/23 188 lb (85.3 kg)    GEN: Well nourished, well developed in no acute distress NECK: No JVD; No carotid bruits CARDIAC: RRR, no murmurs, rubs, gallops RESPIRATORY:  Clear to auscultation without rales, wheezing or rhonchi  ABDOMEN: Soft, non-tender, non-distended EXTREMITIES:  Swelling in upper and lower extremities, no erythema    Assessment & Plan Chronic combined systolic and diastolic heart failure  Recent admission with concern for volume overload, and hypoxia.  Pulmonary edema and pleural effusions on x-ray.  Echo 07/24/2023 revealed LVEF 45 to 50%, global hypokinesis of LV, severe asymmetric LVH of the septal segment, flattened interventricular septum, moderately enlarged RV, moderate to severe mitral valve regurgitation. She has overall felt well since hospital discharge with SOB that she feels is stable. Is concerned about fluid retention and weight fluctuations; does not feel staff at SNF is weighing her appropriately. She has persistent cough with laying down and sleeps with her HOB slightly elevated, no  change recently. Fine crackles left lower lobe. She is knowledgeable and involved in care decisions and feels we need to increase torsemide  to 40 mg BID. Not a candidate for advanced therapies. GDMT limited by CKD and hypotension.  -Increase torsemide  to 40 mg BID at 0900 and 1300, with monitoring of blood pressure and kidney function.  -Check BMET in one week -Monitor weight daily using consistent methods and equipment.  -Report increase in weight or worsening SOB, orthopnea or PND -Limit fluids to 2L, mostly water  Chronic respiratory failure with hypoxia, on supplemental oxygen COPD Managed with supplemental oxygen at 5 L/min and CPAP at night, maintaining oxygen saturation between 93-95%. Occasional cough is present. A new oxygen machine is ordered for higher flow rates if needed.  -Maintain supplemental oxygen at 5 L/min and use CPAP machine at night  CHB s/p PPM implant Remote device check 06/10/2023 with normal device function. -Management per EP cardiology -Continue routine remote device monitoring  Hypotension Blood pressure is typically lower in the morning, with midodrine  used as needed for low blood pressure. No episodes of lightheadedness or syncope. Blood pressure was 106/70 this morning and tends to be higher in the evening.  We discussed that BP may be slightly reduced on higher dose of torsemide .  -Continue midodrine  as needed for low blood pressure  Chronic kidney disease, stage IIIb Reports blood was obtained this morning.  -Check basic metabolic panel in one week to assess kidney function on higher dose torsemide         Dispo: ***  Signed, Rosaline Bane, NP-C

## 2023-12-26 ENCOUNTER — Ambulatory Visit (HOSPITAL_BASED_OUTPATIENT_CLINIC_OR_DEPARTMENT_OTHER): Admitting: Nurse Practitioner

## 2024-03-09 ENCOUNTER — Encounter

## 2024-06-08 ENCOUNTER — Encounter
